# Patient Record
Sex: Female | Born: 1955 | Race: White | Hispanic: No | Marital: Married | Smoking: Former smoker
Health system: Southern US, Community
[De-identification: ages and names within clinical notes are randomized; demographics above are authoritative.]

## PROBLEM LIST (undated history)

## (undated) ENCOUNTER — Emergency Department (HOSPITAL_COMMUNITY): Payer: BLUE CROSS/BLUE SHIELD | Source: Home / Self Care

## (undated) DIAGNOSIS — I10 Essential (primary) hypertension: Secondary | ICD-10-CM

## (undated) DIAGNOSIS — M5030 Other cervical disc degeneration, unspecified cervical region: Secondary | ICD-10-CM

## (undated) DIAGNOSIS — G5601 Carpal tunnel syndrome, right upper limb: Secondary | ICD-10-CM

## (undated) DIAGNOSIS — N390 Urinary tract infection, site not specified: Secondary | ICD-10-CM

## (undated) DIAGNOSIS — I639 Cerebral infarction, unspecified: Secondary | ICD-10-CM

## (undated) DIAGNOSIS — R7989 Other specified abnormal findings of blood chemistry: Secondary | ICD-10-CM

## (undated) DIAGNOSIS — K922 Gastrointestinal hemorrhage, unspecified: Secondary | ICD-10-CM

## (undated) DIAGNOSIS — I2510 Atherosclerotic heart disease of native coronary artery without angina pectoris: Secondary | ICD-10-CM

## (undated) DIAGNOSIS — C50919 Malignant neoplasm of unspecified site of unspecified female breast: Secondary | ICD-10-CM

## (undated) DIAGNOSIS — M797 Fibromyalgia: Secondary | ICD-10-CM

## (undated) DIAGNOSIS — R061 Stridor: Secondary | ICD-10-CM

## (undated) DIAGNOSIS — R778 Other specified abnormalities of plasma proteins: Secondary | ICD-10-CM

## (undated) DIAGNOSIS — E785 Hyperlipidemia, unspecified: Secondary | ICD-10-CM

## (undated) DIAGNOSIS — I2699 Other pulmonary embolism without acute cor pulmonale: Secondary | ICD-10-CM

## (undated) DIAGNOSIS — M5136 Other intervertebral disc degeneration, lumbar region: Secondary | ICD-10-CM

## (undated) DIAGNOSIS — M51369 Other intervertebral disc degeneration, lumbar region without mention of lumbar back pain or lower extremity pain: Secondary | ICD-10-CM

## (undated) HISTORY — DX: Other intervertebral disc degeneration, lumbar region: M51.36

## (undated) HISTORY — DX: Malignant neoplasm of unspecified site of unspecified female breast: C50.919

## (undated) HISTORY — PX: TUBAL LIGATION: SHX77

## (undated) HISTORY — DX: Hyperlipidemia, unspecified: E78.5

## (undated) HISTORY — DX: Fibromyalgia: M79.7

## (undated) HISTORY — DX: Other cervical disc degeneration, unspecified cervical region: M50.30

## (undated) HISTORY — PX: CARDIAC SURGERY: SHX584

## (undated) HISTORY — PX: TRACHEOSTOMY: SUR1362

## (undated) HISTORY — DX: Carpal tunnel syndrome, right upper limb: G56.01

## (undated) HISTORY — PX: APPENDECTOMY: SHX54

## (undated) HISTORY — DX: Other intervertebral disc degeneration, lumbar region without mention of lumbar back pain or lower extremity pain: M51.369

## (undated) HISTORY — PX: BREAST SURGERY: SHX581

---

## 1998-04-02 ENCOUNTER — Encounter: Payer: Self-pay | Admitting: Emergency Medicine

## 1998-04-02 ENCOUNTER — Emergency Department (HOSPITAL_COMMUNITY): Admission: EM | Admit: 1998-04-02 | Discharge: 1998-04-03 | Payer: Self-pay | Admitting: Emergency Medicine

## 1998-04-03 ENCOUNTER — Encounter: Payer: Self-pay | Admitting: Emergency Medicine

## 2000-01-30 ENCOUNTER — Ambulatory Visit (HOSPITAL_BASED_OUTPATIENT_CLINIC_OR_DEPARTMENT_OTHER): Admission: RE | Admit: 2000-01-30 | Discharge: 2000-01-30 | Payer: Self-pay | Admitting: Orthopedic Surgery

## 2002-06-14 ENCOUNTER — Ambulatory Visit (HOSPITAL_COMMUNITY): Admission: RE | Admit: 2002-06-14 | Discharge: 2002-06-14 | Payer: Self-pay | Admitting: Internal Medicine

## 2002-06-14 ENCOUNTER — Encounter: Payer: Self-pay | Admitting: Internal Medicine

## 2002-06-21 ENCOUNTER — Encounter: Payer: Self-pay | Admitting: Internal Medicine

## 2002-06-21 ENCOUNTER — Ambulatory Visit (HOSPITAL_COMMUNITY): Admission: RE | Admit: 2002-06-21 | Discharge: 2002-06-21 | Payer: Self-pay | Admitting: Internal Medicine

## 2003-02-06 ENCOUNTER — Encounter: Payer: Self-pay | Admitting: Family Medicine

## 2003-02-06 ENCOUNTER — Ambulatory Visit (HOSPITAL_COMMUNITY): Admission: RE | Admit: 2003-02-06 | Discharge: 2003-02-06 | Payer: Self-pay | Admitting: Family Medicine

## 2003-03-28 ENCOUNTER — Ambulatory Visit (HOSPITAL_COMMUNITY): Admission: RE | Admit: 2003-03-28 | Discharge: 2003-03-28 | Payer: Self-pay | Admitting: Family Medicine

## 2003-03-28 ENCOUNTER — Encounter: Payer: Self-pay | Admitting: Family Medicine

## 2003-05-31 ENCOUNTER — Ambulatory Visit (HOSPITAL_COMMUNITY): Admission: RE | Admit: 2003-05-31 | Discharge: 2003-05-31 | Payer: Self-pay | Admitting: Family Medicine

## 2003-06-24 HISTORY — PX: CARDIAC CATHETERIZATION: SHX172

## 2003-06-24 HISTORY — PX: VEIN SURGERY: SHX48

## 2003-07-13 ENCOUNTER — Ambulatory Visit (HOSPITAL_COMMUNITY): Admission: RE | Admit: 2003-07-13 | Discharge: 2003-07-13 | Payer: Self-pay | Admitting: Internal Medicine

## 2003-12-17 ENCOUNTER — Observation Stay (HOSPITAL_COMMUNITY): Admission: AD | Admit: 2003-12-17 | Discharge: 2003-12-18 | Payer: Self-pay | Admitting: Cardiovascular Disease

## 2003-12-17 ENCOUNTER — Emergency Department (HOSPITAL_COMMUNITY): Admission: EM | Admit: 2003-12-17 | Discharge: 2003-12-17 | Payer: Self-pay | Admitting: Emergency Medicine

## 2004-01-09 ENCOUNTER — Encounter: Admission: RE | Admit: 2004-01-09 | Discharge: 2004-01-09 | Payer: Self-pay | Admitting: Cardiovascular Disease

## 2004-01-10 ENCOUNTER — Inpatient Hospital Stay (HOSPITAL_COMMUNITY): Admission: RE | Admit: 2004-01-10 | Discharge: 2004-01-14 | Payer: Self-pay | Admitting: Cardiovascular Disease

## 2004-01-12 ENCOUNTER — Encounter (INDEPENDENT_AMBULATORY_CARE_PROVIDER_SITE_OTHER): Payer: Self-pay | Admitting: Specialist

## 2004-11-25 ENCOUNTER — Ambulatory Visit (HOSPITAL_COMMUNITY): Admission: RE | Admit: 2004-11-25 | Discharge: 2004-11-25 | Payer: Self-pay | Admitting: Family Medicine

## 2007-03-02 ENCOUNTER — Ambulatory Visit (HOSPITAL_COMMUNITY): Admission: RE | Admit: 2007-03-02 | Discharge: 2007-03-02 | Payer: Self-pay | Admitting: Family Medicine

## 2007-03-10 ENCOUNTER — Encounter: Admission: RE | Admit: 2007-03-10 | Discharge: 2007-03-10 | Payer: Self-pay | Admitting: Family Medicine

## 2007-03-10 ENCOUNTER — Encounter (INDEPENDENT_AMBULATORY_CARE_PROVIDER_SITE_OTHER): Payer: Self-pay | Admitting: Diagnostic Radiology

## 2007-03-10 DIAGNOSIS — C50919 Malignant neoplasm of unspecified site of unspecified female breast: Secondary | ICD-10-CM

## 2007-03-10 HISTORY — DX: Malignant neoplasm of unspecified site of unspecified female breast: C50.919

## 2007-03-18 ENCOUNTER — Encounter: Admission: RE | Admit: 2007-03-18 | Discharge: 2007-03-18 | Payer: Self-pay | Admitting: Family Medicine

## 2007-03-24 HISTORY — PX: MASTECTOMY: SHX3

## 2007-04-13 ENCOUNTER — Ambulatory Visit (HOSPITAL_COMMUNITY): Admission: RE | Admit: 2007-04-13 | Discharge: 2007-04-14 | Payer: Self-pay | Admitting: General Surgery

## 2007-04-13 ENCOUNTER — Encounter (INDEPENDENT_AMBULATORY_CARE_PROVIDER_SITE_OTHER): Payer: Self-pay | Admitting: General Surgery

## 2007-04-26 ENCOUNTER — Ambulatory Visit: Payer: Self-pay | Admitting: Oncology

## 2007-06-24 DIAGNOSIS — I639 Cerebral infarction, unspecified: Secondary | ICD-10-CM

## 2007-06-24 HISTORY — DX: Cerebral infarction, unspecified: I63.9

## 2007-07-23 ENCOUNTER — Ambulatory Visit: Payer: Self-pay | Admitting: Oncology

## 2007-10-22 ENCOUNTER — Ambulatory Visit: Payer: Self-pay | Admitting: Vascular Surgery

## 2007-10-22 ENCOUNTER — Ambulatory Visit: Payer: Self-pay | Admitting: *Deleted

## 2008-02-22 ENCOUNTER — Ambulatory Visit: Payer: Self-pay | Admitting: Oncology

## 2008-04-20 ENCOUNTER — Emergency Department (HOSPITAL_COMMUNITY): Admission: EM | Admit: 2008-04-20 | Discharge: 2008-04-20 | Payer: Self-pay | Admitting: Emergency Medicine

## 2008-09-04 ENCOUNTER — Ambulatory Visit (HOSPITAL_COMMUNITY): Admission: RE | Admit: 2008-09-04 | Discharge: 2008-09-04 | Payer: Self-pay | Admitting: Family Medicine

## 2008-09-07 ENCOUNTER — Ambulatory Visit: Payer: Self-pay | Admitting: Oncology

## 2008-10-02 LAB — COMPREHENSIVE METABOLIC PANEL
ALT: 19 U/L (ref 0–35)
Albumin: 4 g/dL (ref 3.5–5.2)
CO2: 24 mEq/L (ref 19–32)
Calcium: 9.1 mg/dL (ref 8.4–10.5)
Chloride: 107 mEq/L (ref 96–112)
Glucose, Bld: 112 mg/dL — ABNORMAL HIGH (ref 70–99)
Potassium: 4 mEq/L (ref 3.5–5.3)
Sodium: 141 mEq/L (ref 135–145)
Total Protein: 6.6 g/dL (ref 6.0–8.3)

## 2008-10-02 LAB — CBC WITH DIFFERENTIAL/PLATELET
BASO%: 0.6 % (ref 0.0–2.0)
EOS%: 8.3 % — ABNORMAL HIGH (ref 0.0–7.0)
Eosinophils Absolute: 0.5 10*3/uL (ref 0.0–0.5)
HGB: 14 g/dL (ref 11.6–15.9)
MCH: 29.6 pg (ref 25.1–34.0)
MCHC: 34.3 g/dL (ref 31.5–36.0)
MCV: 86.4 fL (ref 79.5–101.0)
NEUT%: 56.5 % (ref 38.4–76.8)
Platelets: 246 10*3/uL (ref 145–400)
lymph#: 1.7 10*3/uL (ref 0.9–3.3)

## 2008-10-02 LAB — CANCER ANTIGEN 27.29: CA 27.29: 18 U/mL (ref 0–39)

## 2008-10-02 LAB — LACTATE DEHYDROGENASE: LDH: 135 U/L (ref 94–250)

## 2009-10-08 ENCOUNTER — Ambulatory Visit: Payer: Self-pay | Admitting: Vascular Surgery

## 2009-10-19 ENCOUNTER — Ambulatory Visit: Payer: Self-pay | Admitting: Oncology

## 2009-10-22 LAB — COMPREHENSIVE METABOLIC PANEL
BUN: 8 mg/dL (ref 6–23)
CO2: 24 mEq/L (ref 19–32)
Chloride: 105 mEq/L (ref 96–112)
Glucose, Bld: 148 mg/dL — ABNORMAL HIGH (ref 70–99)
Potassium: 4.1 mEq/L (ref 3.5–5.3)
Sodium: 140 mEq/L (ref 135–145)

## 2009-10-22 LAB — LACTATE DEHYDROGENASE: LDH: 154 U/L (ref 94–250)

## 2009-10-22 LAB — CBC WITH DIFFERENTIAL/PLATELET
Basophils Absolute: 0 10*3/uL (ref 0.0–0.1)
Eosinophils Absolute: 0.5 10*3/uL (ref 0.0–0.5)
HCT: 42.7 % (ref 34.8–46.6)
HGB: 14.4 g/dL (ref 11.6–15.9)
MONO#: 0.5 10*3/uL (ref 0.1–0.9)
Platelets: 250 10*3/uL (ref 145–400)
RDW: 14.4 % (ref 11.2–14.5)

## 2009-10-24 ENCOUNTER — Ambulatory Visit (HOSPITAL_COMMUNITY): Admission: RE | Admit: 2009-10-24 | Discharge: 2009-10-24 | Payer: Self-pay | Admitting: Family Medicine

## 2009-12-18 ENCOUNTER — Ambulatory Visit (HOSPITAL_COMMUNITY): Admission: RE | Admit: 2009-12-18 | Discharge: 2009-12-18 | Payer: Self-pay | Admitting: Family Medicine

## 2010-07-13 ENCOUNTER — Encounter: Payer: Self-pay | Admitting: Family Medicine

## 2010-07-14 ENCOUNTER — Encounter: Payer: Self-pay | Admitting: Family Medicine

## 2010-07-15 ENCOUNTER — Encounter: Payer: Self-pay | Admitting: Oncology

## 2010-10-03 ENCOUNTER — Other Ambulatory Visit (HOSPITAL_COMMUNITY): Payer: Self-pay | Admitting: Family Medicine

## 2010-10-03 DIAGNOSIS — Z139 Encounter for screening, unspecified: Secondary | ICD-10-CM

## 2010-10-23 ENCOUNTER — Other Ambulatory Visit: Payer: Self-pay | Admitting: Oncology

## 2010-10-23 ENCOUNTER — Ambulatory Visit (HOSPITAL_COMMUNITY)
Admission: RE | Admit: 2010-10-23 | Discharge: 2010-10-23 | Disposition: A | Discharge status: Home / Self Care | Payer: Medicaid Other | Source: Ambulatory Visit | Attending: Oncology | Admitting: Oncology

## 2010-10-23 ENCOUNTER — Encounter (HOSPITAL_BASED_OUTPATIENT_CLINIC_OR_DEPARTMENT_OTHER): Payer: Medicaid Other | Admitting: Oncology

## 2010-10-23 DIAGNOSIS — C50919 Malignant neoplasm of unspecified site of unspecified female breast: Secondary | ICD-10-CM | POA: Insufficient documentation

## 2010-10-23 DIAGNOSIS — D0590 Unspecified type of carcinoma in situ of unspecified breast: Secondary | ICD-10-CM

## 2010-10-23 DIAGNOSIS — M25476 Effusion, unspecified foot: Secondary | ICD-10-CM | POA: Insufficient documentation

## 2010-10-23 DIAGNOSIS — R52 Pain, unspecified: Secondary | ICD-10-CM

## 2010-10-23 DIAGNOSIS — W19XXXA Unspecified fall, initial encounter: Secondary | ICD-10-CM | POA: Insufficient documentation

## 2010-10-23 DIAGNOSIS — M25473 Effusion, unspecified ankle: Secondary | ICD-10-CM | POA: Insufficient documentation

## 2010-10-23 DIAGNOSIS — S8990XA Unspecified injury of unspecified lower leg, initial encounter: Secondary | ICD-10-CM | POA: Insufficient documentation

## 2010-10-23 DIAGNOSIS — I10 Essential (primary) hypertension: Secondary | ICD-10-CM

## 2010-10-23 DIAGNOSIS — F17200 Nicotine dependence, unspecified, uncomplicated: Secondary | ICD-10-CM

## 2010-10-23 DIAGNOSIS — Z170 Estrogen receptor positive status [ER+]: Secondary | ICD-10-CM

## 2010-10-23 DIAGNOSIS — M25469 Effusion, unspecified knee: Secondary | ICD-10-CM | POA: Insufficient documentation

## 2010-10-23 DIAGNOSIS — M25579 Pain in unspecified ankle and joints of unspecified foot: Secondary | ICD-10-CM | POA: Insufficient documentation

## 2010-10-23 DIAGNOSIS — S8000XA Contusion of unspecified knee, initial encounter: Secondary | ICD-10-CM | POA: Insufficient documentation

## 2010-10-23 LAB — CBC WITH DIFFERENTIAL/PLATELET
HCT: 41.7 % (ref 34.8–46.6)
HGB: 13.9 g/dL (ref 11.6–15.9)
LYMPH%: 27.2 % (ref 14.0–49.7)
MCH: 29.3 pg (ref 25.1–34.0)
MONO%: 5.7 % (ref 0.0–14.0)
Platelets: 238 10*3/uL (ref 145–400)
RBC: 4.77 10*6/uL (ref 3.70–5.45)
RDW: 14.5 % (ref 11.2–14.5)
lymph#: 2.7 10*3/uL (ref 0.9–3.3)

## 2010-10-23 LAB — COMPREHENSIVE METABOLIC PANEL
ALT: 22 U/L (ref 0–35)
Albumin: 3.6 g/dL (ref 3.5–5.2)
CO2: 23 mEq/L (ref 19–32)
Chloride: 102 mEq/L (ref 96–112)
Creatinine, Ser: 0.69 mg/dL (ref 0.40–1.20)
Glucose, Bld: 234 mg/dL — ABNORMAL HIGH (ref 70–99)
Total Protein: 7.2 g/dL (ref 6.0–8.3)

## 2010-10-23 LAB — LACTATE DEHYDROGENASE: LDH: 186 U/L (ref 94–250)

## 2010-10-24 LAB — VITAMIN D 25 HYDROXY (VIT D DEFICIENCY, FRACTURES): Vit D, 25-Hydroxy: 29 ng/mL — ABNORMAL LOW (ref 30–89)

## 2010-10-28 ENCOUNTER — Ambulatory Visit (HOSPITAL_COMMUNITY)
Admission: RE | Admit: 2010-10-28 | Discharge: 2010-10-28 | Disposition: A | Discharge status: Home / Self Care | Payer: Medicaid Other | Source: Ambulatory Visit | Attending: Family Medicine | Admitting: Family Medicine

## 2010-10-28 DIAGNOSIS — Z1231 Encounter for screening mammogram for malignant neoplasm of breast: Secondary | ICD-10-CM | POA: Insufficient documentation

## 2010-10-28 DIAGNOSIS — Z139 Encounter for screening, unspecified: Secondary | ICD-10-CM

## 2010-11-05 NOTE — Op Note (Signed)
NAME:  Kerry [name], Kerry [name]            ACCOUNT NO.:  [id number]   MEDICAL RECORD NO.:  [id number]          PATIENT TYPE:  OIB   LOCATION:  5736                         FACILITY:  [facility]   PHYSICIAN:  Ollen Gross. Vernell Morgans, M.D. DATE OF BIRTH:  May 29, 1956   DATE OF PROCEDURE:  04/13/2007  DATE OF DISCHARGE:                               OPERATIVE REPORT   PREOPERATIVE DIAGNOSIS:  Left breast ductal carcinoma in situ.   POSTOPERATIVE DIAGNOSIS:  Left breast ductal carcinoma in situ.   PROCEDURE:  Left mastectomy and sentinel lymph node biopsy with  injection of blue dye.   SURGEON:  Ollen Gross. Vernell Morgans, M.D.   ANESTHESIA:  General via LMA.   OPERATIVE PROCEDURE:  After informed consent was obtained, the patient  was brought to the operating room and placed in the supine position on  the operating table.  After adequate induction of general anesthesia,  the patient's left chest, breast, and axilla were prepped with Betadine  and draped in the usual sterile manner.  Earlier in the day, the patient  had undergone injection of 1 mCi of technetium sulfur colloid in the  subareolar region.   At this point, 2 mL of methylene blue and 3 mL of injectable saline were  also injected in the subareolar region of the left breast. The NeoProbe  was used to identify a hot spot in the left axilla and this was marked  with blue dye on the skin.  Next, an elliptical incision was mapped out  around the nipple areolar complex from the medial portion of the breast  to the lateral portion of the breast such as to not leave a lot of  excess skin for the closure. This incision was then made with a 10 blade  knife.  The incision was carried down through the skin into the  subcutaneous tissue sharply with electrocautery.  Skin hooks were used  to elevate the skin flaps anteriorly towards the ceiling.  Thin  subcutaneous flaps were then created circumferentially around the  incision.  These skin flaps were taken all  the way down to the chest  wall. Once this was all accomplished, the dissection was also carried  into the left axillary region to the edge of the breast tissue along the  chest wall. After the boundaries of this dissection were created, the  NeoProbe was used to examine the left axillary tissue. A single hot spot  was identified and this was dissected free using the electrocautery.  This was sent as sentinel node number 1 and had ex vivo counts around  200.  Another small lymph node that was blue but not hot was also  identified and it was also excised sharply with electrocautery and we  sent this to pathology as sentinel node number 2. No other hot spots or  palpable adenopathy were appreciable.   The breast, at this point, was then removed from the chest wall with the  pectoralis fascia.  This was done sharply with electrocautery from  superior to inferior and medial to lateral.  Once this was completely  accomplished, the breast was  separated from the chest wall and removed.  It was marked with a stitch on the lateral aspect and sent to pathology  for further evaluation. The wound was irrigated with copious amounts of  saline.  Again, hemostasis was achieved using Bovie electrocautery. One  small vessel in the pectoral axillary area was controlled with a couple  of vascular clips. Once this was accomplished, two small stab incisions  were made inferior to the wound.  A tonsil clamp was placed through each  of these stab wounds and used to bring a drain into the wound, 19 [place]  round Blake drains were used. The drains were placed along the chest  wall.  The drains were anchored to the skin with 3-0 nylon stitch. The  subcutaneous tissue of the incision was then reapproximated with  interrupted 3-0 Vicryl stitches and the skin was then closed with a  running 4-0 Monocryl subcuticular stitch.  A Dermabond dressing was  applied, the drains were placed to suction.  At this point, the  incision  looked good and the skin appeared to be healthy. Sterile  dressings were applied.  The patient tolerated the procedure well.  At  the end of the case, all needle, sponge, and instrument counts were  correct.  The patient was then awakened and taken to the recovery room  in stable condition.  Touch preps on the sentinel nodes were negative.      Ollen Gross. Vernell Morgans, M.D.  Electronically Signed     PST/MEDQ  D:  04/13/2007  T:  04/13/2007  Job:  [id number]

## 2010-11-08 NOTE — Cardiovascular Report (Signed)
NAME:  Kerry [name], Kerry [name]                      ACCOUNT NO.:  [id number]   MEDICAL RECORD NO.:  [id number]                   PATIENT TYPE:  OIB   LOCATION:  2928                                 FACILITY:  [facility]   PHYSICIAN:  Nanetta Batty, M.D.                DATE OF BIRTH:  1955/09/16   DATE OF PROCEDURE:  01/11/2004  DATE OF DISCHARGE:                              CARDIAC CATHETERIZATION   INDICATIONS:  [name] is a 55 year old female who underwent diagnostic  coronary arteriography approximately three weeks ago.  She had development  of right lower extremity claudication approximately two weeks post procedure  and Doppler suggested high grade stenosis versus occlusion of the distal  right external iliac artery/common femoral artery with an ABI of 0.6.  She  was angiogrammed yesterday which confirmed this and PTA was performed with  an undersized balloon and then overnight thrombolysis was performed with  Retavase.  She presents now for relook angiography.   PROCEDURE DESCRIPTION:  The patient was brought to the sixth floor [facility] in postabsorptive state.  She  was not premedicated.  Her left groin was prepped and shaved in the usual  sterile fashion.  1% Xylocaine was used for local anesthesia.  The infusion  wire was removed and the existing 6 [place] long Terumo sheath was then  exchanged over a Wholey wire for a new sterile sheath using sterile double-  glove technique.  Angiography was then performed revealing an occluded right  common femoral artery with reconstitution at the profunda SFA bifurcation  and three-vessel runoff.  Visipaque dye was used for the entirety of the  case.  Retrograde aortic pressure was monitored during the case.  A short 6  French sheath was then replaced at the end of the case in the left femoral  artery over the 3.5 Wholey wire.  The patient received an additional gram of  Rocephin for antibiotic  prophylaxis.  An ACT was measured at approximately  60.  The sheaths were sewn securely in place.   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  The patient  failed overnight thrombolysis.  She will need surgical revascularization.  She left the lab in stable condition.                                               Nanetta Batty, M.D.    [name]  D:  01/11/2004  T:  01/11/2004  Job:  [id number]   cc:   [facility]  [address]  [place], [place] [address]   Dani Gobble, MD  Fax: [phone]   Madelin Rear. Sherwood Gambler, M.D.  [address]  [place]  [place] [address]  Fax: [phone]

## 2010-11-08 NOTE — Cardiovascular Report (Signed)
NAME:  NUR, RABOLD                      ACCOUNT NO.:  [id number]   MEDICAL RECORD NO.:  [id number]                   PATIENT TYPE:  INP   LOCATION:  3712                                 FACILITY:  [facility]   PHYSICIAN:  Cristy Hilts. Jacinto Halim, M.D.                  DATE OF BIRTH:  12-Dec-1955   DATE OF PROCEDURE:  12/18/2003  DATE OF DISCHARGE:  12/18/2003                              CARDIAC CATHETERIZATION   REFERRING PHYSICIAN:  Madelin Rear. Sherwood Gambler, M.D. and Kem Boroughs, M.D.   PROCEDURES PERFORMED:  1. Left ventriculography.  2. Selective right and left coronary arteriography.  3. Ascending aortogram.  4. Abdominal aortogram.  5. Right femoral angiography.  6. Closure of the right femoral artery access with Angio-Seal.   CARDIOLOGIST:  Cristy Hilts. Jacinto Halim, M.D.   INDICATIONS:  Ms. Stallman is a 55 year old Caucasian female without and  significant cardiac risk factors; however, has had a diagnosis of  fibromyalgia.  She previously has had negative Cardiolite and 2-D  echocardiogram; and, now presents with recurrent chest pain.  She was  brought to the cardiac catheterization lab for a definitive diagnosis of  coronary disease.   HEMODYNAMIC DATA:  The left ventricular pressures were 148/20 with an end-  diastolic pressure of 18 mmHg.  Aortic pressure 129/72 with a mean of 96  mmHg.  There is a pressure gradient of 12 mmHg across the aortic valve.   ANGIOGRAPHIC DATA:  Left Ventricle:  The left ventricular systolic function  was normal.  The ejection fraction was estimated at 55% to 60%.   Right Coronary Artery:  The right coronary artery is a large caliber vessel  and a dominant vessel.  It is normal.   Left Main Coronary Artery:  The left main coronary artery is a large caliber  vessel.  It appears normal.   Left Circumflex:  The left circumflex is a large caliber vessel and gives  origin to two small obtuse marginals and continues as a large obtuse  marginal-3.  It is  normal.   Left Anterior Descending Artery:  The left anterior descending artery is  large caliber vessel.  It is normal.   AORTOGRAPHIC DATA:  Ascending Aortogram:  Ascending aortogram reveals the  presence of three aortic valve cusps.  There was no evidence of aortic  regurgitation or aortic dissection.   Abdominal Aortogram:  Abdominal aortogram revealed the presence of two renal  arteries; one on either side that are widely patent.  There is no evidence  of abdominal aortic aneurysm.  The aortoiliac bifurcation was widely patent.   IMPRESSION:  1. Normal coronary arteries with normal left ventricular systolic function,     ejection fraction 55-60%.  2. Normal coronary arteries, right dominant circulation.  3. A pressure gradient across the aortic valve of 12 mmHg.  The aortic     valves appear to be normal on ascending aortogram.  No aortic  regurgitation and no aortic dissection.   RECOMMENDATIONS:  1. Evaluation for noncardiac cause of chest pain is indicated.  2. Echocardiogram to rule out bicuspid aortic valve; it previously was not     done - needs to be done.   TECHNIQUE OF THE PROCEDURE:  Under the usual sterile precautions using a 6  French right femoral artery access a 6 [place] multipurpose B-2 catheter was  advanced into the ascending aorta with a 0.035 inch J-wire.  The catheter  easily advanced in the left ventricle.  Left ventricular pressures were  monitored.  Hand contrast injection in the left ventricle was performed both  in the LAO and RAO projections.  The catheter was flushed with saline and  pulled back to the ascending aorta, and pressure gradient across the aortic  valve was monitored.  The right coronary artery was selectively engaged and  angiography was performed.  In a similar fashion the left main coronary  artery was selectively engaged and angiography was performed.  Then the  catheter was pulled into the root of the aorta and ascending  aortogram was  performed in the LAO projection.  Then the catheter was pulled back into the  abdominal aorta and abdominal aortogram was performed.  Then the catheter  was pulled out of the body in the usual fashion.  Right femoral angiography  was performed through the arterial access sheath and the access was closed  with Angio-Seal with excellent hemostasis obtained.   The patient was transferred to the recovery room in a stable condition.  The  patient tolerated the procedure well.                                               Cristy Hilts. Jacinto Halim, M.D.    Pilar Plate  D:  12/18/2003  T:  12/18/2003  Job:  [id number]   cc:   Madelin Rear. Sherwood Gambler, M.D.  [address]  [place]  [place] [address]  Fax: [phone]   Dani Gobble, MD  Fax: [phone]

## 2010-11-08 NOTE — Discharge Summary (Signed)
NAME:  Kerry [name], Kerry [name]                      ACCOUNT NO.:  [id number]   MEDICAL RECORD NO.:  [id number]                   PATIENT TYPE:  INP   LOCATION:  2004                                 FACILITY:  [facility]   PHYSICIAN:  Nanetta Batty, M.D.                DATE OF BIRTH:  [birth year]   DATE OF ADMISSION:  01/09/2004  DATE OF DISCHARGE:  01/14/2004                                 DISCHARGE SUMMARY   ADMISSION DIAGNOSES:  1. Progressive claudication, right lower extremity.  2. Abnormal Dopplers performed January 09, 2004, suggesting high-frequency     signal in right external iliac artery.  3. History of cardiac catheterization December 18, 2003, with normal coronary     arteries, normal left ventricular function.  4. Fibromyalgia.  5. Urinary frequency.   DISCHARGE DIAGNOSES:  1. Progressive claudication right lower extremity.  2. Abnormal Dopplers performed January 09, 2004, suggesting high-frequency     signal in right external iliac artery.  3. History of cardiac catheterization December 18, 2003, with normal coronary     arteries, normal left ventricular function.  4. Fibromyalgia.  5. Urinary frequency.  6. Status post peripheral vascular angiogram on January 10, 2004.  See the     dictated report for details.  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  At that time, she was planned for infusion of reteplase overnight with  heparin.  1. Status post re-look angiogram on January 11, 2004, by Dr. Nanetta Batty.     Right CFA remains occluded.  2. Status post surgical consultation on January 12, 2004, with subsequent right     CVA thromboembolectomy by Dr. Madilyn Fireman.   HISTORY OF PRESENT ILLNESS:  Ms. Lawsyn Heiler is a 55 year old  moderately overweight white female with a history of chest discomfort,  fibromyalgia and family history of heart disease.  She underwent diagnostic  cardiac catheterization by Dr. Jacinto Halim on December 18, 2003, revealing normal  coronary  arteries and normal LV function.  She did have a Cardiolite prior  to that which suggested anterior ischemia versus breast attenuation.  Two  weeks post catheterization, she developed progressive claudication in her  right lower extremity, and Dopplers were performed in our office on January 08, 2004, which revealed a high frequency signal in the right external iliac  artery.  She then saw Dr. Allyson Sabal on January 09, 2004.  He felt that given her  Doppler findings and her symptoms, that we needed to proceed with diagnostic  PV angiogram.  Risks and benefits were discussed with the patient, and she  was agreeable to proceed.   On January 10, 2004, she underwent PV angiogram by Dr. Allyson Sabal.  She was found to  have a totally occluded right CFA.  At that time, he planned PTA with a  pulse spray catheter placed.  This was discussed with Dr. Irish Lack.  They planned to infuse  reteplase and heparin.  This would be infused  overnight.  We will plan for re-look angiogram in the morning.  Please see  Dr. Hazle Coca dictated note for other details.   On January 11, 2004, she underwent re-look PV angiogram.  The right CFA remains  occluded with three vessel runoff despite overnight lysis.  He planned to  obtain surgical evaluation.   On January 11, 2004, she was evaluated by Dr. Madilyn Fireman.  He planned for surgical  exploration with repair.   On January 12, 2004, she underwent right CFA thromboembolectomy with DPA/repair  of the dissection.  This was performed by Dr. Balinda Quails.  She had no  complications, and left the OR in stable condition to the PACU.   On the morning of January 13, 2004, she was sore but otherwise no other  complaints.  At this point, she remained stable.  She was seen by CVTS, and  she was felt to be stable.  They planned to ambulate and discontinue Foley.   On January 14, 2004, she remained stable, and continued to feel well.  She was  afebrile and hemodynamically stable with pulse 73, blood pressure  106/69.  Lungs were clear.  Heart is in a regular rhythm.  There was no lower-  extremity edema.  She has 2+ peripheral pulse.  At this point, she has been  seen and evaluated by Dr. Darrick Penna and also by Dr. Domingo Sep, who has deemed  her stable for discharge home.   HOSPITAL CONSULTATIONS:  CTS consultation with Dr. Liliane Bade on January 11, 2004.  This was for evaluation of right CFA occlusion.  At that point, he  planned for surgical exploration and repair.   HOSPITAL PROCEDURES:  1. She had diagnostic peripheral vascular angiography on January 10, 2004, by     Dr. Nanetta Batty.  She was found to have a totally-occluded right CFA.     She then had overnight lysis.  2. She had repeat angiogram by Dr. Allyson Sabal on January 11, 2004.  At that time, it     was found that the right CFA remained occluded.  3. Right CFA thromboembolectomy with DPA/repair of dissection on January 12, 2004, by Dr. Liliane Bade.   Telemetry showed sinus rhythm, sinus bradycardia, no other arrhythmia.   LABORATORY DATA:  I can not find in the chart where they have all labs  printed.  Obtainable labs include January 11, 2004, white count 10.1,  hemoglobin 12.8, hematocrit 38.1, platelets 255,000.  Sodium 134, potassium  3.7, BUN 5, creatinine 0.7.  Glucose 148.   Discharged July 24, sodium 129, potassium 4.0, BUN 2, creatinine 0.7,  glucose 112.  White count 7.3.  Hematocrit 32.8, platelets 191,000.   DISCHARGE MEDICATIONS:  Resume home medications.   DISCHARGE INSTRUCTIONS:  1. No lifting over 10 pounds.  2. No driving for one week.  3. Keep the right groin dry with gauze.  4. May shower and pat the incision dry.  No ointment or creams needed.   FOLLOWUP:  1. In one week, the office was to call him with an appointment to see Dr.     Domingo Sep.  2. He was to call to make an appointment to see Dr. Madilyn Fireman in the next two to     three weeks.      Mary B. Remer Macho, P.A.-C.                   Christiane Ha  Allyson Sabal, M.D.     MBE/MEDQ  D:  02/02/2004  T:  02/03/2004  Job:  [id number]   cc:   Nanetta Batty, M.D.  Fax: [phone]   Dani Gobble, MD  Fax: [phone]   Madelin Rear. Sherwood Gambler, M.D.  [address]  [place]  [place] [address]  Fax: [phone]   P. Liliane Bade, M.D.  [address]  [place]  [place] [address]

## 2010-11-08 NOTE — Op Note (Signed)
NAME:  Kerry [name], Kerry [name]                      ACCOUNT NO.:  [id number]   MEDICAL RECORD NO.:  [id number]                   PATIENT TYPE:  OIB   LOCATION:  2004                                 FACILITY:  [facility]   PHYSICIAN:  Balinda Quails, M.D.                 DATE OF BIRTH:  Feb 01, 1956   DATE OF PROCEDURE:  01/12/2004  DATE OF DISCHARGE:                                 OPERATIVE REPORT   SURGEON:  Balinda Quails, M.D.   ASSISTANT:  Rowe Clack, P.A.-C.   ANESTHETIC:  General with LMA.   ANESTHESIOLOGIST:  Bedelia Person, M.D.   PREOPERATIVE DIAGNOSIS:  Right common femoral artery occlusion.   POSTOPERATIVE DIAGNOSIS:  Right common femoral artery occlusion.   PROCEDURE:  Right common femoral artery repair with Dacron patch  angioplasty.   CLINICAL NOTE:  [name] is a 55 year old female who recently  underwent elective cardiac catheterization approximately four weeks ago.  She developed right lower extremity claudication following this.  Workup for  this revealed evidence of right common femoral artery occlusion.  An attempt  was made at endovascular recannulization unsuccessfully with lysis.  She is  brought to the operating room at this time for elective repair of the right  common femoral artery injury.   PROCEDURE NOTE:  The patient was brought to the operating room in stable  condition.  She was placed in the supine position, general anesthesia  induced with LMA.  Right leg prepped in sterile fashion.  Longitudinal skin  incision was made through the right groin.  Dissection was carried down  through the subcutaneous tissue with electrocautery.  Lymphatics divided.  Deep dissection was carried down to expose the inguinal ligament.  The  inguinal ligament mobilized with retractors superiorly.  Common femoral  artery was freed and dissected proximally up to the external iliac artery.  The common femoral artery revealed no pulse.  The high external iliac artery  did  reveal palpable pulse.  The proximal external iliac artery encircled  with a vessel loop.  Distal dissection carried down to expose the profunda  superficial femoral arteries which were encircled with vessel loops.  The  patient administered 5000 units of heparin intravenously.  The femoral  vessel was controlled with clamps.  A longitudinal arteriotomy made in the  common femoral artery.  This revealed organized thrombus.  The arteriotomy  extended proximally and distally.  There was evidence of a dissection with a  flap in the common femoral artery.  The true lumen was re-entered beyond the  injury site.  The dissected flap was resected and the intima was tacked down  distally with interrupted 6-0 Prolene suture.  A 4 Fogarty was then passed  proximally up the external iliac artery, thrombus removed and excellent  inflow obtained.   The common femoral artery was then repaired with a Dacron patch using  running 6-0 Prolene suture.  At completion of  the patch angioplasty, vessels  were flushed.  Clamps were removed, directing initial antegrade flow down  the profunda, the superficial femoral artery then released.  The right foot  was pink with a palpable dorsalis pedis pulse.   The wound irrigated with antibiotic solution.  Subcutaneous tissue closed  with interrupted 2-0 Vicryl suture in a deep layer, running 2-0 Vicryl and 3-  0 Vicryl suture in the intermediate and superficial layers.  Skin closed  with staples.  Sterile dressings were applied.   The patient tolerated the procedure well, transferred to the recovery room  in stable condition.  No apparent complications.                                               Balinda Quails, M.D.    PGH/MEDQ  D:  01/12/2004  T:  01/13/2004  Job:  [id number]   cc:   Nanetta Batty, M.D.  Fax: [phone]   [facility]

## 2010-11-08 NOTE — H&P (Signed)
NAME:  Kerry [name], Kerry [name]                      ACCOUNT NO.:  [id number]   MEDICAL RECORD NO.:  [id number]                   PATIENT TYPE:  INP   LOCATION:  3712                                 FACILITY:  [facility]   PHYSICIAN:  Nanetta Batty, M.D.                DATE OF BIRTH:  January 08, 1956   DATE OF ADMISSION:  12/17/2003  DATE OF DISCHARGE:                                HISTORY & PHYSICAL   Kerry [name] is 55 year old female, followed by Dr. Sherwood Gambler and seen in the  past by Dr. Domingo Sep for chest pain.  She reportedly had a negative  Cardiolite study and an echocardiogram in the past.  She has a history of  fibromyalgia.  She presented to [facility] ER today with complaints of chest  pain.  She says this morning she had the onset of left chest and left arm  pain while at rest.  She became nauseated and diaphoretic.  She denies any  shortness of breath.  Symptoms waxed and waned till she came to the  emergency room at [facility], where she was given aspirin, heparin,  oxygen, nitroglycerin, and morphine.  Eventually her symptoms subsided.  Enzymes and EKG up there were unremarkable.  She is transferred now for  further evaluation.  She denies any recent exertional chest pain or unusual  dyspnea.   PAST MEDICAL HISTORY:  1. Fibromyalgia.  2. She has had a remote appendectomy.  3. Right oophorectomy.  4. She had a carpal tunnel release in 2001.  She denies any history of diabetes, hypertension, or known hyperlipidemia.   CURRENT MEDICATIONS:  None.  She has been prescribed Voltaren, Dextral, and  Xanax but does not take these.   She has no known drug allergies but is intolerant to:  1. ASPIRIN.  2. CODEINE which cause nausea.   SOCIAL HISTORY:  She is married.  She has two children and no grandchildren.  She has never smoked.  She has alcohol occasionally.   FAMILY HISTORY:  Unremarkable for coronary disease.  Her mother is alive and  has mitral valve prolapse.  Her  father is alive and has a history of chronic  kidney disease.   REVIEW OF SYSTEMS:  Essentially unremarkable, except for noted above.  She  denies any GI bleeding or peptic ulcer disease.  She has not had thyroid  problems.  She denies any recent fever or chills.   PHYSICAL EXAMINATION:  VITAL SIGNS:  Blood pressure 133/70, pulse 78, temp  97.6, respirations 18.  GENERAL:  She is a well developed overweight female in no acute distress.  HEENT:  Normocephalic.  Extraocular movements are intact.  Sclerae is  nonicteric.  Lids and conjunctivae are within normal limits.  NECK:  Without JVD and without bruit.  CHEST:  Clear to auscultation percussion.  CARDIAC:  Reveals a regular rate and rhythm without murmur, rub or gallop.  Normal S1 S2.  ABDOMEN:  Obese, nontender.  No hepatosplenomegaly.  EXTREMITIES:  Without edema.  Distal pulses are 2+/4 bilaterally with no  femoral artery bruits.  NEURO:  Grossly intact.  She is awake, alert, and oriented, cooperative.  Moves all extremities without obvious deficit.   LABS:  White count 7.7, hemoglobin 12.9, hematocrit 37.5, platelets 277.  Sodium 136, potassium 3.7, BUN 10, creatinine 0.6, INR is 0.9.  CK-MB and  troponin are negative x 2.   Chest x-ray shows no active disease.   EKG shows normal sinus rhythm without acute changes.   IMPRESSION:  1. Chest pain consistent with unstable angina, rule out coronary disease.  2. History of fibromyalgia.  3. Obesity.   PLAN:  The patient is admitted to telemetry.  We will continue with IV  heparin.  We will also add nitrate, and low-dose beta-blocker, and Protonix.  She will be setup for diagnostic catheterization tomorrow.      Abelino Derrick, P.A.                      Nanetta Batty, M.D.    Lenard Lance  D:  12/17/2003  T:  12/18/2003  Job:  [id number]   cc:   Dani Gobble, MD  Fax: [phone]

## 2010-11-08 NOTE — Cardiovascular Report (Signed)
NAME:  Kerry [name], Kerry [name]                      ACCOUNT NO.:  [id number]   MEDICAL RECORD NO.:  [id number]                   PATIENT TYPE:  OIB   LOCATION:  2899                                 FACILITY:  [facility]   PHYSICIAN:  Nanetta Batty, M.D.                DATE OF BIRTH:  1955/07/21   DATE OF PROCEDURE:  01/10/2004  DATE OF DISCHARGE:                              CARDIAC CATHETERIZATION   INDICATIONS:  Kerry [name] is a 55 year old moderately overweight white  female who underwent diagnostic coronary arteriography by Dr. Jeanella Cara December 18, 2003 revealing normal coronary arteries and LV function.  Her groin was  sealed with Angio-Seal.  Two weeks after that she developed right lower  extremity claudication.  Dopplers revealed an ABI of 0.6 on the right with a  high frequency signal.  She presents now for angiography and potential  intervention.   PROCEDURE DESCRIPTION:  The patient was brought to the sixth floor [facility]. She was premedicated with p.o.  Valium.  Her left groin was prepped and shaved in the usual sterile fashion.  1% Xylocaine was used for local anesthesia.  A 5 French sheath was inserted  into the right femoral artery using standard Seldinger technique. A 5 French  tennis racquet catheter was used for midstream and distal abdominal  aortography as well as bifemoral runoff.  Isovue dye was used for the  entirety of the case.  Retrograde aortic pressure was monitored during the  case.   ANGIOGRAPHIC RESULTS:  1. Abdominal aorta:     A. Renal arteries- normal.     B. Infrarenal abdominal aorta- normal.  2. Left lower extremity normal with three-vessel runoff.  3. Right lower extremity:     A. Tapered stenosis beginning in the right external iliac artery        culminating a short segment occlusion of the right common femoral        artery with reconstitution by collaterals at the SFA profunda        bifurcation.  There  is three-vessel runoff.   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  She is significantly  symptomatic.  We will proceed with attempted percutaneous intervention.   PROCEDURE DESCRIPTION:  The patient received 3000 units of heparin  intravenously.  Contralateral access was obtained with a short IMA catheter  and 0.035 Wholey wire.  A 6 French long Terumo sheath was then advanced over  the iliac bifurcation and a 5 French end-hole catheter was used for backup.  The [facility] wire easily crossed the common femoral occlusion and was placed  in the SFA.  Percutaneous transluminal coronary angioplasty was performed  with 4 x 4 Powerflex at nominal pressures doing overlapping inflations.  This did not provide an excellent angiographic result, but suggested the  presence of organizing thrombus.  Because of this, a  20 cm long pulsespray  catheter was then advanced over the iliac bifurcation and placed across the  common femoral head straddling the common femoral and SFA artery on the  right.  Plans will be to infuse reteplase with 10 unit bolus followed by 4.4  units per hour overnight with heparin infusion via the intravenous line and  side arm sheath in split dose for a total of 600 units.  It should be noted  the patient has started her menses, however, I do not think that she will  become systemically lytic and therefore do not think this will exacerbate  her menses.  However, if she has high volume vaginal bleeding we will have  to discontinue the local thrombolysis.  The sheath and pulsespray catheter  were sewn securely in place.  I am going to give the patient a gram of Ancef  as well as Solu-Medrol and Benadryl as well.                                               Nanetta Batty, M.D.    [name]  D:  01/10/2004  T:  01/10/2004  Job:  [id number]   cc:   [facility]  [address]  [place], [place] [address]   Dani Gobble, MD  Fax: [phone]   Cristy Hilts. Jacinto Halim, M.D.  [address]  [place]  [place] [address]  Fax: [phone]   Madelin Rear. Sherwood Gambler, M.D.  [address]  [place]  [place] [address]  Fax: [phone]

## 2010-11-08 NOTE — Op Note (Signed)
[facility]  Patient:    Kerry [name], Kerry [name]                     MRN: [id number] Proc. Date: 01/30/00 Attending:  Nicki Reaper, M.D.                           Operative Report  PREOPERATIVE DIAGNOSIS:  Carpal tunnel syndrome, right hand.  POSTOPERATIVE DIAGNOSIS:  Carpal tunnel syndrome, right hand.  OPERATION:  Decompression right median nerve.  SURGEON:  Nicki Reaper, M.D.  ASSISTANT:  Joaquin Courts, R.N.  ANESTHESIA:  Forearm-based IV regional.  ANESTHESIOLOGIST:  Janetta Hora. Gelene Mink, M.D.  INDICATIONS:  The patient is a 55 year old female with a history of carpal tunnel syndrome.  EMG nerve conduction positive, which has not responded to conservative treatment.  DESCRIPTION OF PROCEDURE:  The patient was brought to the operating room where a forearm-based IV regional anesthetic was carried out without difficulty. She was prepared and draped using Betadine scrub and solution with the right arm free.  A longitudinal incision was made in the palm and carried down through the subcutaneous tissue.  Bleeders were electrocauterized.  The palmar fascia was split and superficial palmar arch identified.  The flexor tendon to the ring little finger identified to the ulnar side of the median nerve.  The carpal retinaculum was incised with sharp dissection.  A right-angle and Sewall retractor were placed between the skin and forearm fascia.  The fascia was released for approximately 3 cm proximal to the wrist crease.  Complete, however, was not completed.  It was decided to proceed with an incision down the distal forearm, and a small incision was made and carried down through the subcutaneous tissue.  The remainder of the palmar fascia and distal forearm fascia was released under direct vision.  The wounds were irrigated and the skin was closed with interrupted 5-0 nylon suture.  A sterile compressive dressing and splint was applied.  The patient  tolerated the procedure well and was taken to the recovery room for observation in satisfactory condition.  She is discharged home to return to the [facility] in one week on [place], Xanax and Keflex. DD:  01/30/00 TD:  01/30/00 Job: [id number]

## 2011-04-02 LAB — COMPREHENSIVE METABOLIC PANEL
BUN: 9
Calcium: 9.3
Creatinine, Ser: 0.83
GFR calc non Af Amer: >60
Glucose, Bld: 100 — ABNORMAL HIGH
Sodium: 139
Total Protein: 7.4

## 2011-04-02 LAB — CBC
HCT: 43.2
Hemoglobin: 14.6
MCHC: 33.7
MCV: 87.8
RDW: 13.8

## 2011-04-02 LAB — HCG, SERUM, QUALITATIVE: Preg, Serum: NEGATIVE

## 2011-04-02 LAB — DIFFERENTIAL
Lymphocytes Relative: 26
Lymphs Abs: 2.1
Monocytes Relative: 6
Neutro Abs: 5.3
Neutrophils Relative %: 63

## 2011-04-10 ENCOUNTER — Other Ambulatory Visit (HOSPITAL_COMMUNITY): Payer: Self-pay | Admitting: Family Medicine

## 2011-04-10 ENCOUNTER — Ambulatory Visit (HOSPITAL_COMMUNITY)
Admission: RE | Admit: 2011-04-10 | Discharge: 2011-04-10 | Disposition: A | Discharge status: Home / Self Care | Payer: Medicaid Other | Source: Ambulatory Visit | Attending: Family Medicine | Admitting: Family Medicine

## 2011-04-10 DIAGNOSIS — M7660 Achilles tendinitis, unspecified leg: Secondary | ICD-10-CM

## 2011-04-10 DIAGNOSIS — M7730 Calcaneal spur, unspecified foot: Secondary | ICD-10-CM | POA: Insufficient documentation

## 2011-06-02 ENCOUNTER — Telehealth: Payer: Self-pay | Admitting: *Deleted

## 2011-06-02 NOTE — Telephone Encounter (Signed)
none of these patient's phone numbers work mailed out calendar and letter to inform the patient of the new date and time

## 2011-09-12 DIAGNOSIS — I2510 Atherosclerotic heart disease of native coronary artery without angina pectoris: Secondary | ICD-10-CM | POA: Insufficient documentation

## 2011-09-12 DIAGNOSIS — I10 Essential (primary) hypertension: Secondary | ICD-10-CM | POA: Insufficient documentation

## 2011-09-12 DIAGNOSIS — F17200 Nicotine dependence, unspecified, uncomplicated: Secondary | ICD-10-CM | POA: Insufficient documentation

## 2011-09-12 DIAGNOSIS — E119 Type 2 diabetes mellitus without complications: Secondary | ICD-10-CM | POA: Insufficient documentation

## 2011-09-12 DIAGNOSIS — R0789 Other chest pain: Secondary | ICD-10-CM | POA: Insufficient documentation

## 2011-09-13 ENCOUNTER — Other Ambulatory Visit: Payer: Self-pay

## 2011-09-13 ENCOUNTER — Encounter (HOSPITAL_COMMUNITY): Payer: Self-pay | Admitting: *Deleted

## 2011-09-13 ENCOUNTER — Emergency Department (HOSPITAL_COMMUNITY)
Admission: EM | Admit: 2011-09-13 | Discharge: 2011-09-13 | Disposition: A | Discharge status: Home / Self Care | Payer: Medicaid Other | Attending: Emergency Medicine | Admitting: Emergency Medicine

## 2011-09-13 ENCOUNTER — Emergency Department (HOSPITAL_COMMUNITY): Payer: Medicaid Other

## 2011-09-13 DIAGNOSIS — R0789 Other chest pain: Secondary | ICD-10-CM

## 2011-09-13 HISTORY — DX: Atherosclerotic heart disease of native coronary artery without angina pectoris: I25.10

## 2011-09-13 HISTORY — DX: Essential (primary) hypertension: I10

## 2011-09-13 LAB — DIFFERENTIAL
Basophils Absolute: 0 10*3/uL (ref 0.0–0.1)
Eosinophils Relative: 3 % (ref 0–5)
Lymphocytes Relative: 30 % (ref 12–46)
Lymphs Abs: 2.6 10*3/uL (ref 0.7–4.0)
Monocytes Absolute: 0.6 10*3/uL (ref 0.1–1.0)
Neutro Abs: 5.4 10*3/uL (ref 1.7–7.7)

## 2011-09-13 LAB — CBC
HCT: 42.2 % (ref 36.0–46.0)
MCV: 87.2 fL (ref 78.0–100.0)
RBC: 4.84 MIL/uL (ref 3.87–5.11)
WBC: 8.9 10*3/uL (ref 4.0–10.5)

## 2011-09-13 LAB — BASIC METABOLIC PANEL
CO2: 25 mEq/L (ref 19–32)
Calcium: 9.3 mg/dL (ref 8.4–10.5)
Chloride: 98 mEq/L (ref 96–112)
Glucose, Bld: 262 mg/dL — ABNORMAL HIGH (ref 70–99)
Sodium: 135 mEq/L (ref 135–145)

## 2011-09-13 LAB — TROPONIN I
Troponin I: <0.3 ng/mL (ref <0.30)
Troponin I: <0.3 ng/mL (ref <0.30)

## 2011-09-13 MED ORDER — ASPIRIN 81 MG PO CHEW
324.0000 mg | CHEWABLE_TABLET | Freq: Once | ORAL | Status: AC
  Administered 2011-09-13: 324 mg via ORAL
  Filled 2011-09-13: qty 4

## 2011-09-13 NOTE — ED Notes (Signed)
Pt reporting chest pain that began about 8pm.  States that she believes it is due to anxiety, but that she also has a history of HTN and wanted to be evaluated.

## 2011-09-13 NOTE — Discharge Instructions (Signed)
Cardiac Biomarkers Cardiac biomarkers are enzymes, proteins, and hormones that are associated with heart function, damage or failure. Some of the tests are specific for the heart while others are also elevated with skeletal muscle damage. Cardiac biomarkers are used for diagnostic and prognostic purposes and are frequently ordered by caregivers when someone comes into the Emergency Room complaining of symptoms, such as chest pain, pressure, nausea, and shortness of breath. These tests are ordered, along with other laboratory and non-laboratory tests, to detect heart failure (which is often a chronic, progressive condition affecting the ability of the heart to fill with blood and pump efficiently) and the acute coronary syndromes (ACS) as well as to help determine prognosis for people who have had a heart attack. ACS is a group of symptoms that reflect a sudden decrease in the amount of blood and oxygen, also termed 'ischemia,' reaching the heart. This decrease is frequently due to either a narrowing of the coronary arteries (atherosclerosis or vessel spasm) or unstable plaques, which can cause a blood clot (thrombus) and blockage of blood flow. If the oxygen supply is low, it can cause angina (pain); if blood flow is reduced, it can cause death of heart cells (called myocardial infarction or heart attack) and can lead to death of the affected heart muscle cells and to permanent damage and scarring of the heart.  The goal with cardiac biomarkers is to be able to detect the presence and severity of an acute heart condition as soon as possible so that appropriate treatment can be initiated.  There are only a few cardiac biomarkers that are being routinely used by physicians. Some have been phased out because they are not as specific as the marker of choice - troponin. Many other potential cardiac biomarkers are still being researched but their clinical utility has yet to be established.  Note: Cardiac biomarkers  are not the same tests as those that are used to screen the general healthy population for their risk of developing heart disease. Those can be found under Cardiac Risk Assessment. LABORATORY TESTS CURRENT CARDIAC BIOMARKERS   CK and CK-MB   BNP or (NT-proBNP)   Troponin   Myoglobin (not always used; sometimes ordered with troponin)  MORE GENERAL TESTS FREQUENTLY ORDERED ALONG WITH CARDIAC BIOMARKERS   Blood Gases   CMP   BMP   Electrolytes   CBC  ON THE HORIZON Ischemia modified albumin (IMA) - Test has received FDA approval for use with troponin and electrocardiogram to rule out acute coronary syndrome (ACS) in patients with chest pain. May become useful for identifying patients at higher risk of heart attack and potentially could replace myoglobin one day.  NON-LABORATORY TESTS These tests allow caregivers to look at the size, shape, and function of the heart as it is beating. They can be used to detect changes to the rhythm of the heart as well as to detect and evaluate damaged tissues and blocked arteries.   EKG (ECG, electrocardiogram)   Coronary angiography (or arteriography)   Stress testing   Nuclear scan   ECG (echocardiogram)   Chest X-ray  THE FOLLOWING SUMMARIZES CURRENTLY USED CARDIAC BIOMARKERS. Marker: CK  What: Enzyme that exists in three different isoforms   Where Found: Heart, brain, and skeletal muscle   What Indicates: Injury to muscle cells   Time to Increase: 4 to 6 hours after injury, peaks in 18 to 24 hours   Time back to Normal: Normal in 48 to 72 hours, unless due to continuing injury  When/How Used: Being phased out, may be ordered prior to CK-MB  Marker: CK-MB  What: Heart- related portion of total CK enzyme   Where Found: Heart primarily, but also in skeletal muscle   What Indicates: Injury (cell death) to heart   Time to Increase: 4 to 6 hrs after heart attack, peaks in 12 to 20 hours   Time back to Normal: Returns to normal  in 24 to 48 hours unless new/continual damage   When/How Used: Not as specific as Troponin for heart injury/attack, may be ordered when Troponin is not available, may be ordered to monitor new/continuing damage  Marker: Myoglobin  What: Small oxygen-storing protein   Where Found: Heart and other muscle cells   What Indicates: Injury to heart or other muscle cells. Also elevated with kidney problems.   Time to Increase: Starts to rise within 2 to 3 hours, peaks in 8 to 12 hours.   Time back to Normal: Falls back to normal by about one day after injury occurred   When/How Used: Ordered along with Troponin, helps diagnose heart injury/attack  Marker: Cardiac Troponin  What: Components of a Regulatory protein complex. Two cardiac specific isoforms: T and I   Where Found: Heart muscle   What Indicates: Heart injury/damage   Time to Increase: 4 to 8 hours   Time back to Normal: Remains elevated for 7 to 14 days   When/How Used: Ordered to help assess prognosis and diagnose heart attack  Marker: LDH  What: Enzyme   Where Found: Almost all body tissues   What Indicates: General marker of injury to cells   When/How Used: Phased out, not specific  Marker: AST  What: Enzyme   Where Found: Almost all body tissues   What Indicates: General marker of injury to cells   When/How Used: Phased out, not specific  Marker: Hs-CRP  What: Protein   Where Found: Associated with athero-sclerosis   What Indicates: Inflammatory process   Time back to Normal: Elevated with inflammation   When/How Used: May help determine prognosis of patients who have had heart attack  Marker: BNP  What: Hormone   Where Found: Heart's left ventricle   What Indicates: Heart failure   Time back to Normal: Elevation related to severity   When/How Used: Help diagnose and evaluate heart failure, prognosis, and to monitor therapy  Document Released: 07/02/2004 Document Revised: 05/29/2011 Document  Reviewed: 03/19/2005 [facility] Patient Information 2012 [place], [place].Chest Pain (Nonspecific) It is often hard to give a specific diagnosis for the cause of chest pain. There is always a chance that your pain could be related to something serious, such as a heart attack or a blood clot in the lungs. You need to follow up with your caregiver for further evaluation. CAUSES   Heartburn.   Pneumonia or bronchitis.   Anxiety or stress.   Inflammation around your heart (pericarditis) or lung (pleuritis or pleurisy).   A blood clot in the lung.   A collapsed lung (pneumothorax). It can develop suddenly on its own (spontaneous pneumothorax) or from injury (trauma) to the chest.   Shingles infection (herpes zoster virus).  The chest wall is composed of bones, muscles, and cartilage. Any of these can be the source of the pain.  The bones can be bruised by injury.   The muscles or cartilage can be strained by coughing or overwork.   The cartilage can be affected by inflammation and become sore (costochondritis).  DIAGNOSIS  Lab tests or other  studies, such as X-rays, electrocardiography, stress testing, or cardiac imaging, may be needed to find the cause of your pain.  TREATMENT   Treatment depends on what may be causing your chest pain. Treatment may include:   Acid blockers for heartburn.   Anti-inflammatory medicine.   Pain medicine for inflammatory conditions.   Antibiotics if an infection is present.   You may be advised to change lifestyle habits. This includes stopping smoking and avoiding alcohol, caffeine, and chocolate.   You may be advised to keep your head raised (elevated) when sleeping. This reduces the chance of acid going backward from your stomach into your esophagus.   Most of the time, nonspecific chest pain will improve within 2 to 3 days with rest and mild pain medicine.  HOME CARE INSTRUCTIONS   If antibiotics were prescribed, take your antibiotics as  directed. Finish them even if you start to feel better.   For the next few days, avoid physical activities that bring on chest pain. Continue physical activities as directed.   Do not smoke.   Avoid drinking alcohol.   Only take over-the-counter or prescription medicine for pain, discomfort, or fever as directed by your caregiver.   Follow your caregiver's suggestions for further testing if your chest pain does not go away.   Keep any follow-up appointments you made. If you do not go to an appointment, you could develop lasting (chronic) problems with pain. If there is any problem keeping an appointment, you must call to reschedule.  SEEK MEDICAL CARE IF:   You think you are having problems from the medicine you are taking. Read your medicine instructions carefully.   Your chest pain does not go away, even after treatment.   You develop a rash with blisters on your chest.  SEEK IMMEDIATE MEDICAL CARE IF:   You have increased chest pain or pain that spreads to your arm, neck, jaw, back, or abdomen.   You develop shortness of breath, an increasing cough, or you are coughing up blood.   You have severe back or abdominal pain, feel nauseous, or vomit.   You develop severe weakness, fainting, or chills.   You have a fever.  THIS IS AN EMERGENCY. Do not wait to see if the pain will go away. Get medical help at once. Call your local emergency services (911 in [place] Do not drive yourself to the hospital. MAKE SURE YOU:   Understand these instructions.   Will watch your condition.   Will get help right away if you are not doing well or get worse.  Document Released: 03/19/2005 Document Revised: 05/29/2011 Document Reviewed: 01/13/2008 [facility] Patient Information 2012 [place], [place].Electrocardiography Electrocardiograhy (EKG or ECG) is a test. It is used to check the heart. It looks at your heartbeat, heart size, and can tell if you have had a heart attack. No electricity will flow  into your body during the test. PROCEDURE  You will remove your clothes from the waist up, wear a hospital gown, and lie down on your back.   Small round pads (electrodes) will be placed on your arms, legs, and chest. The round pads are attached to wires that go to an EKG machine. The EKG machine records the electrical activity of your heart.   You will be asked to relax and lie very still for a few seconds.   The EKG takes a few minutes.  AFTER THE PROCEDURE  If the EKG was done as part of a routine exam, you can  go back to your normal activity as told by your doctor.   If the EKG was done because you were having chest pain, you may need to have more testing for your safety.   Your EKG results will be looked at by a heart doctor Higher education careers adviser).  Finding out the results of your test Ask when your test results will be ready. Make sure you get your test results. Document Released: 05/22/2008 Document Revised: 05/29/2011 Document Reviewed: 05/22/2008 [facility] Patient Information 2012 [place], [place].

## 2011-09-13 NOTE — ED Notes (Signed)
Pt reports left sided cp starting 4 hs ago

## 2011-09-13 NOTE — ED Provider Notes (Signed)
History     CSN: [id number]  Arrival date & time 09/12/11  2358   First MD Initiated Contact with Patient 09/13/11 0040      Chief Complaint  Patient presents with  . Chest Pain    (Consider location/radiation/quality/duration/timing/severity/associated sxs/prior treatment) Patient is a 56 y.o. female presenting with chest pain. The history is provided by the patient and medical records.  Chest Pain The chest pain began 3 - 5 hours ago. Duration of episode(s) is 3 minutes. Chest pain occurs intermittently. The chest pain is resolved. The pain is associated with stress (Emotional stress from an argument at home). At its most intense, the pain is at 4/10. The pain is currently at 0/10. The severity of the pain is moderate. The quality of the pain is described as sharp and dull (Sharp, dull, and "like numbness" at her left chest wall without any radiation.). The pain does not radiate. Chest pain is worsened by stress (Emotional stress). Pertinent negatives for primary symptoms include no fever, no fatigue, no syncope, no shortness of breath, no cough, no wheezing, no palpitations, no abdominal pain, no nausea, no vomiting and no dizziness.  Pertinent negatives for associated symptoms include no claudication, no diaphoresis, no lower extremity edema, no near-syncope, no numbness, no orthopnea and no weakness. She tried nothing for the symptoms. Risk factors: Diabetes, hypertension.  Pertinent negatives for past medical history include no CAD and no MI. Past medical history comments: The patient had a negative cardiac cath in 2005 showing no coronary artery disease of any significance. The patient has had a left mastectomy from breast cancer in the past.  Procedure history is positive for cardiac catheterization. Procedure history comments: Negative cardiac catheterization in 2005.     Past Medical History  Diagnosis Date  . Hypertension   . Coronary artery disease   . Diabetes mellitus      Past Surgical History  Procedure Date  . Cardiac surgery   . Appendectomy   . Breast surgery     No family history on file.  History  Substance Use Topics  . Smoking status: Current Everyday Smoker  . Smokeless tobacco: Not on file  . Alcohol Use: No    OB History    Grav Para Term Preterm Abortions TAB SAB Ect Mult Living                  Review of Systems  Constitutional: Negative for fever, chills, diaphoresis, activity change, appetite change and fatigue.  HENT: Negative for congestion, facial swelling, rhinorrhea, neck pain, neck stiffness and postnasal drip.   Eyes: Negative.   Respiratory: Negative for cough, choking, chest tightness, shortness of breath and wheezing.        Dyspnea on exertion  Cardiovascular: Positive for chest pain. Negative for palpitations, orthopnea, claudication, leg swelling, syncope and near-syncope.  Gastrointestinal: Negative for nausea, vomiting, abdominal pain and abdominal distention.  Musculoskeletal: Negative.   Skin: Negative.   Neurological: Negative for dizziness, syncope, speech difficulty, weakness, light-headedness, numbness and headaches.  Hematological: Does not bruise/bleed easily.  Psychiatric/Behavioral: Negative.     Allergies  Oxycodone  Home Medications   Current Outpatient Rx  Name Route Sig Dispense Refill  . ARTHROTEC PO Oral Take by mouth.    Marland Kitchen LISINOPRIL PO Oral Take by mouth.    . METFORMIN HCL PO Oral Take by mouth.    . BYSTOLIC PO Oral Take by mouth.      BP 162/83  Pulse 101  Temp(Src)  98.6 F (37 C) (Oral)  Resp 20  Ht 5\' 6"  (1.676 m)  Wt 244 lb (110.678 kg)  BMI 39.38 kg/m2  SpO2 97%  Physical Exam  Nursing note and vitals reviewed. Constitutional: She is oriented to person, place, and time. She appears well-nourished. No distress.  HENT:  Head: Normocephalic and atraumatic.  Mouth/Throat: Oropharynx is clear and moist.  Eyes: EOM are normal. Pupils are equal, round, and reactive  to light.  Neck: Normal range of motion. Neck supple. No JVD present. No tracheal deviation present.  Cardiovascular: Normal rate, regular rhythm, S1 normal, S2 normal, normal heart sounds and intact distal pulses.   No extrasystoles are present. PMI is not displaced.  Exam reveals no gallop and no friction rub.   No murmur heard. Pulmonary/Chest: Effort normal and breath sounds normal. No accessory muscle usage or stridor. Not tachypneic. No respiratory distress. She has no decreased breath sounds. She has no wheezes. She has no rhonchi. She has no rales. She exhibits tenderness and bony tenderness. She exhibits no crepitus and no retraction.       The patient's left chest is noteworthy for some concavity of the chest wall and absence of left breast secondary to remote history of mastectomy. The patient has even rise and fall of the left chest without any crepitus or instability, and is tender to palpation over the left costochondral joints in the left chest wall, reproducing the patient's pain.  Abdominal: Soft. Bowel sounds are normal. She exhibits no distension and no mass. There is no tenderness. There is no rebound and no guarding.  Musculoskeletal: Normal range of motion. She exhibits no edema and no tenderness.  Neurological: She is alert and oriented to person, place, and time. No cranial nerve deficit. She exhibits normal muscle tone.  Skin: Skin is warm and dry. No rash noted. She is not diaphoretic. No erythema. No pallor.  Psychiatric: She has a normal mood and affect. Her behavior is normal. Judgment and thought content normal.    ED Course  Procedures (including critical care time)  Labs Reviewed  BASIC METABOLIC PANEL - Abnormal; Notable for the following:    Glucose, Bld 262 (*)    Creatinine, Ser 0.48 (*)    All other components within normal limits  CBC  DIFFERENTIAL  TROPONIN I  TROPONIN I   Dg Chest Portable 1 View  09/13/2011  *RADIOLOGY REPORT*  Clinical Data: Chest  pain.  CHEST - 1 VIEW  Comparison:  04/20/2008  Findings: The heart size and mediastinal contours are within normal limits.  Both lungs are clear.  IMPRESSION: No active disease.  Original Report Authenticated By: Danae Orleans, M.D.     No diagnosis found.    MDM  MI, ACS, Unstable angina not thought likely as the etiology of the patient's atypical symptoms, with no history of CAD or MI, with reproducible symptoms on chest wall palpation suggestive of musculoskeletal chest wall pain, and normal ecg, normal cardiac enzymes.  I will obtain a 6 hour troponin to assure that my impression is correct, and if so, will discharge the patient home for outpatient follow up as needed.  The patient states her understanding of and agreement with the diagnosis and plan of care.       Felisa Bonier, MD 09/13/11 [phone]

## 2011-10-23 ENCOUNTER — Other Ambulatory Visit: Payer: Self-pay | Admitting: Oncology

## 2011-10-23 DIAGNOSIS — Z139 Encounter for screening, unspecified: Secondary | ICD-10-CM

## 2011-10-27 ENCOUNTER — Telehealth: Payer: Self-pay | Admitting: *Deleted

## 2011-10-27 NOTE — Telephone Encounter (Signed)
due to md being out of the office moved patient appointment to 12-17-2011 starting at 3:30pm left voice message informing the patient of the new date and time of 12-17-2011 starting at 3:30pm

## 2011-10-28 ENCOUNTER — Telehealth: Payer: Self-pay | Admitting: *Deleted

## 2011-10-28 NOTE — Telephone Encounter (Signed)
patient confirmed over the phone the new date and time of the appointment for lab and md 12-17-2011

## 2011-10-29 ENCOUNTER — Ambulatory Visit: Payer: Medicaid Other | Admitting: Oncology

## 2011-10-29 ENCOUNTER — Other Ambulatory Visit: Payer: Medicaid Other | Admitting: Lab

## 2011-10-30 ENCOUNTER — Ambulatory Visit (HOSPITAL_COMMUNITY)
Admission: RE | Admit: 2011-10-30 | Discharge: 2011-10-30 | Disposition: A | Discharge status: Home / Self Care | Payer: Medicaid Other | Source: Ambulatory Visit | Attending: Oncology | Admitting: Oncology

## 2011-10-30 ENCOUNTER — Other Ambulatory Visit: Payer: Self-pay | Admitting: Oncology

## 2011-10-30 DIAGNOSIS — Z1231 Encounter for screening mammogram for malignant neoplasm of breast: Secondary | ICD-10-CM | POA: Insufficient documentation

## 2011-10-30 DIAGNOSIS — Z139 Encounter for screening, unspecified: Secondary | ICD-10-CM

## 2011-12-17 ENCOUNTER — Other Ambulatory Visit (HOSPITAL_BASED_OUTPATIENT_CLINIC_OR_DEPARTMENT_OTHER): Payer: Medicaid Other | Admitting: Lab

## 2011-12-17 ENCOUNTER — Ambulatory Visit: Payer: Medicaid Other | Admitting: Oncology

## 2011-12-17 VITALS — BP 159/89 | HR 96 | Temp 98.5°F | Ht 66.0 in | Wt 234.1 lb

## 2011-12-17 DIAGNOSIS — E559 Vitamin D deficiency, unspecified: Secondary | ICD-10-CM

## 2011-12-17 DIAGNOSIS — C50919 Malignant neoplasm of unspecified site of unspecified female breast: Secondary | ICD-10-CM

## 2011-12-17 DIAGNOSIS — D0590 Unspecified type of carcinoma in situ of unspecified breast: Secondary | ICD-10-CM

## 2011-12-17 LAB — COMPREHENSIVE METABOLIC PANEL
AST: 12 U/L (ref 0–37)
Albumin: 3.8 g/dL (ref 3.5–5.2)
BUN: 11 mg/dL (ref 6–23)
CO2: 25 mEq/L (ref 19–32)
Calcium: 9.6 mg/dL (ref 8.4–10.5)
Chloride: 106 mEq/L (ref 96–112)
Creatinine, Ser: 0.59 mg/dL (ref 0.50–1.10)
Glucose, Bld: 151 mg/dL — ABNORMAL HIGH (ref 70–99)
Potassium: 3.7 mEq/L (ref 3.5–5.3)

## 2011-12-17 LAB — CBC WITH DIFFERENTIAL/PLATELET
Basophils Absolute: 0.1 10*3/uL (ref 0.0–0.1)
EOS%: 3.8 % (ref 0.0–7.0)
Eosinophils Absolute: 0.4 10*3/uL (ref 0.0–0.5)
HCT: 43.2 % (ref 34.8–46.6)
HGB: 14.6 g/dL (ref 11.6–15.9)
MCH: 29 pg (ref 25.1–34.0)
MONO#: 0.5 10*3/uL (ref 0.1–0.9)
NEUT#: 6.6 10*3/uL — ABNORMAL HIGH (ref 1.5–6.5)
NEUT%: 62.1 % (ref 38.4–76.8)
RDW: 13.8 % (ref 11.2–14.5)
lymph#: 3 10*3/uL (ref 0.9–3.3)

## 2011-12-17 NOTE — Progress Notes (Signed)
Hematology and Oncology Follow Up Visit  Kerry [name] [id number] [birth year] 56 y.o. 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  . Malignant neoplasm of breast (female), unspecified site Yes  . Unspecified vitamin D deficiency    History of vasculopathy  PAST THERAPY: As above   Interim History:  She has been in relatively well. There have been no intercurrent illnesses. She had a mammogram in April which was normal. Her diabetes appears to be under poor control she is now taking insulin Her hemoglobin A1c is been quite elevated.  Medications: I have reviewed the patient's current medications.  Allergies:  Allergies  Allergen Reactions  . Oxycodone     Past Medical History, Surgical history, Social history, and Family History were reviewed and updated.  Review of Systems: Constitutional:  Negative for fever, chills, night sweats, anorexia, weight loss, pain. Cardiovascular: no chest pain or dyspnea on exertion Respiratory: no cough, shortness of breath, or wheezing Neurological: negative Dermatological: negative ENT: negative Skin Gastrointestinal: negative Genito-Urinary: negative Hematological and Lymphatic: negative Breast: negative Musculoskeletal: negative Remaining ROS negative.  Physical Exam:  Blood pressure 159/89, pulse 96, temperature 98.5 F (36.9 C), height 5\' 6"  (1.676 m), weight 234 lb 1.6 oz (106.187 kg).  ECOG: 1HEENT:  Sclerae anicteric, conjunctivae pink.  Oropharynx clear.  No mucositis or candidiasis.  Nodes:  No cervical, supraclavicular, or axillary lymphadenopathy palpated.  Breast Exam:  Right breast is benign.  No masses, discharge, skin change, or nipple inversion.  Left breast is surgically absent, chest wall exam unremarkable. Both axilla are normal...  Lungs:  Clear to auscultation bilaterally.  No crackles, rhonchi, or  wheezes.  Heart:  Regular rate and rhythm.  Abdomen:  Soft, nontender.  Positive bowel sounds.  No organomegaly or masses palpated.  Musculoskeletal:  No focal spinal tenderness to palpation.  Extremities:  Benign.  No peripheral edema or cyanosis.  Skin:  She has been a multiple abrasions and lacerations on her lower extremities which he finds are secondary to her animals .Neuro:  Nonfocal.       Lab Results: Lab Results  Component Value Date   WBC 10.6* 12/17/2011   HGB 14.6 12/17/2011   HCT 43.2 12/17/2011   MCV 86.0 12/17/2011   PLT 239 12/17/2011     Chemistry      Component Value Date/Time   NA 142 12/17/2011 1528   K 3.7 12/17/2011 1528   CL 106 12/17/2011 1528   CO2 25 12/17/2011 1528   BUN 11 12/17/2011 1528   CREATININE 0.59 12/17/2011 1528      Component Value Date/Time   CALCIUM 9.6 12/17/2011 1528   ALKPHOS 77 12/17/2011 1528   AST 12 12/17/2011 1528   ALT 15 12/17/2011 1528   BILITOT 0.2* 12/17/2011 1528       Radiological Studies:  No results found.   IMPRESSIONS AND PLAN: A 56 y.o. female with history of DCIS status post mastectomy on observation. I will see her in a years time, with a annual mammogram.     Spent more than half the time coordinating care, as well as discussion of BMI and its implications.      Pierce Crane 6/26/20134:35 PM Cell [phone]

## 2011-12-18 ENCOUNTER — Telehealth: Payer: Self-pay | Admitting: *Deleted

## 2011-12-18 NOTE — Telephone Encounter (Signed)
GAVE PATIENT APPOINTMENT FOR 2014 WITH THE LAB ONE WEEK BEFORE

## 2012-09-11 ENCOUNTER — Telehealth: Payer: Self-pay | Admitting: Oncology

## 2012-09-11 NOTE — Telephone Encounter (Signed)
NOT ABLE TO LEAVE MESSAGE

## 2012-09-13 ENCOUNTER — Encounter: Payer: Self-pay | Admitting: Oncology

## 2012-09-14 ENCOUNTER — Telehealth: Payer: Self-pay | Admitting: *Deleted

## 2012-09-14 ENCOUNTER — Encounter: Payer: Self-pay | Admitting: Oncology

## 2012-09-14 ENCOUNTER — Other Ambulatory Visit (HOSPITAL_COMMUNITY): Payer: Self-pay | Admitting: Family Medicine

## 2012-09-14 DIAGNOSIS — Z139 Encounter for screening, unspecified: Secondary | ICD-10-CM

## 2012-09-14 NOTE — Telephone Encounter (Signed)
Pt called stating that she would like to see Dr. Darnelle Catalan.  Cancelled June's appts as she requested.  Confirmed rescheduled 11/08/12 appt w pt.  Mailed letter & calendar to pt.

## 2012-11-01 ENCOUNTER — Ambulatory Visit (HOSPITAL_COMMUNITY): Payer: Medicaid Other

## 2012-11-04 ENCOUNTER — Ambulatory Visit (HOSPITAL_COMMUNITY)
Admission: RE | Admit: 2012-11-04 | Discharge: 2012-11-04 | Disposition: A | Discharge status: Home / Self Care | Payer: Medicaid Other | Source: Ambulatory Visit | Attending: Family Medicine | Admitting: Family Medicine

## 2012-11-04 DIAGNOSIS — Z1231 Encounter for screening mammogram for malignant neoplasm of breast: Secondary | ICD-10-CM | POA: Insufficient documentation

## 2012-11-04 DIAGNOSIS — Z139 Encounter for screening, unspecified: Secondary | ICD-10-CM

## 2012-11-05 ENCOUNTER — Telehealth: Payer: Self-pay | Admitting: *Deleted

## 2012-11-05 NOTE — Telephone Encounter (Signed)
Pt called stating that some tried to call from our facility, but she does not have an answering machine so not sure who it was. Looked in EPIC and there was no note listed of who tired to call her.  Informed her that her appt was Monday and still on as scheduled and verified time with her.  Told her that it could have been our call service reminding her of the appt, but I am not sure.   She requested that if anyone did try to call her and could not get her at home, to please call her cell phone and leave a message there and it will state that you are calling Kerry [name]!!

## 2012-11-08 ENCOUNTER — Ambulatory Visit (HOSPITAL_BASED_OUTPATIENT_CLINIC_OR_DEPARTMENT_OTHER): Payer: Medicaid Other | Admitting: Family

## 2012-11-08 ENCOUNTER — Encounter: Payer: Self-pay | Admitting: Family

## 2012-11-08 VITALS — BP 169/89 | HR 89 | Temp 98.5°F | Resp 20 | Ht 66.0 in | Wt 233.3 lb

## 2012-11-08 DIAGNOSIS — Z87898 Personal history of other specified conditions: Secondary | ICD-10-CM

## 2012-11-08 DIAGNOSIS — C50919 Malignant neoplasm of unspecified site of unspecified female breast: Secondary | ICD-10-CM | POA: Insufficient documentation

## 2012-11-08 DIAGNOSIS — I10 Essential (primary) hypertension: Secondary | ICD-10-CM

## 2012-11-08 DIAGNOSIS — C50912 Malignant neoplasm of unspecified site of left female breast: Secondary | ICD-10-CM

## 2012-11-08 NOTE — Patient Instructions (Addendum)
Please contact us at [phone] if you have any questions or concerns.  Please continue to do well and enjoy life!!!  Get plenty of rest, drink plenty of water, exercise daily (walking for 30 minutes), eat a balanced diet.  Take calcium 600 mg daily in addition to vitamin D3 1000 IUs daily.   Complete monthly self-breast examinations.  Have a clinical breast exam by a physician every year.  Have your mammogram completed every year.  Talk to Dr. Roxy Horseman about a referral to Dr. Burlene Arnt or other mental health professional.

## 2012-11-08 NOTE — Progress Notes (Signed)
[facility]  [phone] [phone]  OFFICE PROGRESS NOTE   ID: Kerry [name]   DOB: 1955-08-14  MR#: [id number]  JYN#:[id number]   PCP: Kerry Stalling, MD SU: Kerry [name]. Kerry Morgans, MD   HISTORY OF PRESENT ILLNESS: From Dr. Theron Arista [name]'s the patient evaluation note dated 05/24/2007: "[name] who presents with a history of DCIS, referred by Dr. Felicity [name]. This is a woman from [place], [place], in previous good health, who had an abnormal screening mammogram on 03/02/07.  This showed calcifications in the left breast.  A diagnostic mammogram performed 03/10/07 showed suspicious calcifications.  Biopsy was recommended.  Biopsy performed on 03/10/07 showed DCIS associated with microcalcifications.  This appeared to be ER/PR positive at 76 and 64%, respectively.  A preoperative MRI scan was performed of both breasts on 03/18/07 and this did show negative bilateral breast MRIs.  The patient elected to undergo mastectomy on 04/13/07.  This was performed with an accompanying lymph node evaluation.  Final pathology from case [id number] showed a 1.6 cm focus of DCIS and immediate grade of necrosis, negative surgical margins.  Two sentinel lymph nodes were negative.  The patient has had an unremarkable postoperative course. She has had some seromatous accumulation of fluid."  Her subsequent history is as detailed below.   INTERVAL HISTORY: Dr. Darnelle [name] and I saw Kerry [name] today for follow up of ductal carcinoma in situ, of the left breast.  The patient was last seen by Dr. Donnie [name] on  12/17/2011.  Since her last office visit, the patient has been doing relatively well.  She is establishing herself with Dr. Darrall [name] service today.   REVIEW OF SYSTEMS: A 10 point review of systems was completed and is negative except occasional left-sided chest wall area swelling, ongoing depression, hot flashes and night sweats.  The patient denies any other  symptomatology.   PAST MEDICAL HISTORY: Past Medical History  Diagnosis Date  . Hypertension   . Coronary artery disease   . Diabetes mellitus   . Breast cancer 03/10/2007    Left breat  . Degenerative disc disease, cervical   . Degenerative disc disease, lumbar   . Fibromyalgia   . Hyperlipidemia   . Carpal tunnel syndrome on right     PAST SURGICAL HISTORY: Past Surgical History  Procedure Laterality Date  . Cardiac surgery    . Appendectomy    . Breast surgery    . Mastectomy Left 03/2007  . Cardiac catheterization  2005  . Vein surgery Right 2005  . Tubal ligation Right   Includes history of artery repair right leg, after cardiac catheterization in 2005.     FAMILY HISTORY Family History  Problem Relation Age of Onset  . Cancer Mother     Breast cancer (right)  . Diabetes Sister   . Cancer Maternal Aunt     Breast cancer  . Cancer Maternal Aunt     Breast cancer  . Cancer Cousin     Breast Cancer  History of kidney tumor in her father.  Breast cancer in her mother at age 73.  Breast cancer in an aunt diagnosed at an early age.  She also has a paternal cousin with history of breast cancer in her 30s.     GYNECOLOGIC HISTORY: Gravida 5, para 2.  Menarche at age 89.  She has been perimenopausal.  She is currently on disability secondary to a back injury.   SOCIAL HISTORY: [name] has been married to her husband comments since 1979.  They have 2 adult sons and one grandson. The patient and her husband are separated and the patient states she has a boyfriend.  She is living with her boyfriend.  The patient has been disabled for a number of years and has not worked since 2005.  The patient enjoys sitting on her porch, gardening in her greenhouse, watching and feeding the birds in her spare time.  She started smoking at the age of 16.  A pack lasts about 3-4 days.     ADVANCED DIRECTIVES: Not on file    HEALTH MAINTENANCE: History  Substance Use Topics   . Smoking status: Current Every Day Smoker -- 0.25 packs/day  . Smokeless tobacco: Never Used  . Alcohol Use: Yes     Comment: Social drinker    Colonoscopy: Not on file  PAP: Not on file  Bone density: Not on file  Lipid panel: Not on file   Allergies  Allergen Reactions  . Oxycodone   . Oxycontin (Oxycodone Hcl)     Current Outpatient Prescriptions  Medication Sig Dispense Refill  . amLODipine (NORVASC) 10 MG tablet Take 10 mg by mouth daily.      . cloNIDine (CATAPRES) 0.1 MG tablet Take 0.1 mg by mouth 2 (two) times daily.      Marland Kitchen glipiZIDE (GLUCOTROL XL) 10 MG 24 hr tablet Take 10 mg by mouth daily.      . insulin glargine (LANTUS) 100 UNIT/ML injection Inject 20 Units into the skin at bedtime.      Marland Kitchen LISINOPRIL PO Take 20 mg by mouth daily.       Marland Kitchen LORazepam (ATIVAN) 1 MG tablet Take 1 mg by mouth at bedtime.      . metoprolol succinate (TOPROL-XL) 50 MG 24 hr tablet Take 50 mg by mouth daily. Take with or immediately following a meal.      . oxyCODONE-acetaminophen (PERCOCET) 5-325 MG per tablet Take 1 tablet by mouth daily as needed.      Marland Kitchen PARoxetine (PAXIL) 20 MG tablet Take 20 mg by mouth every morning.      . pravastatin (PRAVACHOL) 40 MG tablet Take 40 mg by mouth daily.      . SUMAtriptan (IMITREX) 100 MG tablet Take 100 mg by mouth daily.       No current facility-administered medications for this visit.    OBJECTIVE: Filed Vitals:   11/08/12 0858  BP: 169/89  Pulse: 89  Temp: 98.5 F (36.9 C)  Resp: 20     Body mass index is 37.67 kg/(m^2).      ECOG FS: 1 - Symptomatic but completely ambulatory  General appearance: Alert, cooperative, well nourished, no apparent distress Head: Normocephalic, without obvious abnormality, atraumatic Eyes: Conjunctivae/corneas clear, PERRLA, EOMI Nose: Nares, septum and mucosa are normal, no drainage or sinus tenderness Neck: No adenopathy, supple, symmetrical, trachea midline, thyroid not enlarged, no  tenderness Resp: Clear to auscultation bilaterally Cardio: Regular rate and rhythm, S1, S2 normal, no murmur, click, rub or gallop Breasts: Left breast is surgically absent, left chest wall area has well-healed surgical scars, right breast is pendulous and has glandular breast tissue, no nipple inversion, right breast firm medial inframammary ridge, bilateral axillary fullness GI: Soft, distended, non-tender, hypoactive bowel sounds, excessive habitus Skin: The patient has numerous scars and lacerations on bilateral upper extremities and lower extremities in various stages of healing, tattoos Extremities: Extremities normal, atraumatic, no cyanosis or edema Lymph  nodes: Cervical, supraclavicular, and axillary nodes normal Neurologic: Grossly normal   LAB RESULTS: Lab Results  Component Value Date   WBC 10.6* 12/17/2011   NEUTROABS 6.6* 12/17/2011   HGB 14.6 12/17/2011   HCT 43.2 12/17/2011   MCV 86.0 12/17/2011   PLT 239 12/17/2011      Chemistry      Component Value Date/Time   NA 142 12/17/2011 1528   K 3.7 12/17/2011 1528   CL 106 12/17/2011 1528   CO2 25 12/17/2011 1528   BUN 11 12/17/2011 1528   CREATININE 0.59 12/17/2011 1528      Component Value Date/Time   CALCIUM 9.6 12/17/2011 1528   ALKPHOS 77 12/17/2011 1528   AST 12 12/17/2011 1528   ALT 15 12/17/2011 1528   BILITOT 0.2* 12/17/2011 1528      Lab Results  Component Value Date   LABCA2 18 10/02/2008    Urinalysis No results found for this basename: colorurine,  appearanceur,  labspec,  phurine,  glucoseu,  hgbur,  bilirubinur,  ketonesur,  proteinur,  urobilinogen,  nitrite,  leukocytesur    STUDIES: Mm Digital Screening Unilat R 11/05/2012   *RADIOLOGY REPORT*  Clinical Data: Screening.  MAMMOGRAPHIC UNILATERAL RIGHT DIGITAL SCREENING WITH CAD  Comparison:  Previous exams.  FINDINGS:  ACR Breast Density Category 2: There is a scattered fibroglandular pattern.  No suspicious masses, architectural distortion, or  calcifications are present.  Images were processed with CAD.  IMPRESSION: No mammographic evidence of malignancy.  A result letter of this screening mammogram will be mailed directly to the patient.  RECOMMENDATION: Screening mammogram in one year. (Code:SM-B-01Y)  BI-RADS CATEGORY 1:  Negative.   Original Report Authenticated By: Sherian Rein, M.D.    ASSESSMENT: 56 y.o. [place], [place] woman: 1.  Status post left breast upper outer quadrant needle core biopsy on 03/10/2007 that showed ductal carcinoma in situ associated with microcalcifications, ER 76%, PR 54%.  2.  Status post bilateral breast MRI on 03/18/2007 which showed no suspicious masses or enhancements were seen bilaterally. There were no correlating abnormalities in the left upper outer quadrant in the region of the DCIS biopsy.  No axillary adenopathy was present bilaterally.  3.  Status post left breast simple mastectomy with left axillary sentinel node biopsy on 04/13/2007 for a stage 0, pTis, 1.6 cm ductal carcinoma in situ, intermediate grade with necrosis and microcalcifications, with extension into the lactiferous duct, and negative surgical margins, ER 76%, PR 54%, and 0/2 positive lymph nodes.  4.  Genetics letter dated 12/03/2007 stated the patient was negative for the BRCA1 and BRCA2 gene mutations.  5.  Elevated blood pressure  PLAN: The patient is nearing 6  years from her time of diagnosis and will officially become a graduate of [employer]'s breast cancer program today. The patient's husband Maisie Fus was present for her graduation ceremony. We asked that she continue annual clinical breast examinations by a physician in addition to annual mammography.  Her last mammogram on 11/05/2012 showed no mammographic evidence of malignancy. She is due for her annual mammogram in 11/09/13.  With regards to the patient's elevated blood pressure, she states that she had not taking her blood pressure medications today.  All  questions were answered.  The patient was encouraged to contact us with any problems, questions or concerns.   Larina Bras, NP-C 11/08/2012, 4:54 PM

## 2012-12-10 ENCOUNTER — Other Ambulatory Visit: Payer: Medicaid Other | Admitting: Lab

## 2012-12-17 ENCOUNTER — Ambulatory Visit: Payer: Medicaid Other | Admitting: Family

## 2012-12-17 ENCOUNTER — Ambulatory Visit: Payer: Medicaid Other | Admitting: Oncology

## 2013-12-06 ENCOUNTER — Other Ambulatory Visit (HOSPITAL_COMMUNITY): Payer: Self-pay | Admitting: Family Medicine

## 2013-12-06 DIAGNOSIS — Z1231 Encounter for screening mammogram for malignant neoplasm of breast: Secondary | ICD-10-CM

## 2013-12-08 ENCOUNTER — Ambulatory Visit (HOSPITAL_COMMUNITY)
Admission: RE | Admit: 2013-12-08 | Discharge: 2013-12-08 | Disposition: A | Discharge status: Home / Self Care | Payer: Medicaid Other | Source: Ambulatory Visit | Attending: Family Medicine | Admitting: Family Medicine

## 2013-12-08 DIAGNOSIS — Z1231 Encounter for screening mammogram for malignant neoplasm of breast: Secondary | ICD-10-CM

## 2014-02-21 ENCOUNTER — Encounter (HOSPITAL_COMMUNITY): Payer: Self-pay | Admitting: Emergency Medicine

## 2014-02-21 ENCOUNTER — Emergency Department (HOSPITAL_COMMUNITY)
Admission: EM | Admit: 2014-02-21 | Discharge: 2014-02-22 | Disposition: A | Discharge status: Home / Self Care | Payer: Medicaid Other | Attending: Emergency Medicine | Admitting: Emergency Medicine

## 2014-02-21 ENCOUNTER — Emergency Department (HOSPITAL_COMMUNITY): Payer: Medicaid Other

## 2014-02-21 DIAGNOSIS — R1012 Left upper quadrant pain: Secondary | ICD-10-CM

## 2014-02-21 DIAGNOSIS — E785 Hyperlipidemia, unspecified: Secondary | ICD-10-CM | POA: Insufficient documentation

## 2014-02-21 DIAGNOSIS — E119 Type 2 diabetes mellitus without complications: Secondary | ICD-10-CM | POA: Insufficient documentation

## 2014-02-21 DIAGNOSIS — F17200 Nicotine dependence, unspecified, uncomplicated: Secondary | ICD-10-CM | POA: Insufficient documentation

## 2014-02-21 DIAGNOSIS — Z79899 Other long term (current) drug therapy: Secondary | ICD-10-CM | POA: Diagnosis not present

## 2014-02-21 DIAGNOSIS — C50919 Malignant neoplasm of unspecified site of unspecified female breast: Secondary | ICD-10-CM

## 2014-02-21 DIAGNOSIS — Z9889 Other specified postprocedural states: Secondary | ICD-10-CM | POA: Diagnosis not present

## 2014-02-21 DIAGNOSIS — I2510 Atherosclerotic heart disease of native coronary artery without angina pectoris: Secondary | ICD-10-CM | POA: Diagnosis not present

## 2014-02-21 DIAGNOSIS — Z8669 Personal history of other diseases of the nervous system and sense organs: Secondary | ICD-10-CM | POA: Diagnosis not present

## 2014-02-21 DIAGNOSIS — E559 Vitamin D deficiency, unspecified: Secondary | ICD-10-CM

## 2014-02-21 DIAGNOSIS — R079 Chest pain, unspecified: Secondary | ICD-10-CM | POA: Diagnosis present

## 2014-02-21 DIAGNOSIS — I10 Essential (primary) hypertension: Secondary | ICD-10-CM | POA: Diagnosis not present

## 2014-02-21 DIAGNOSIS — R0789 Other chest pain: Secondary | ICD-10-CM | POA: Insufficient documentation

## 2014-02-21 DIAGNOSIS — Z8739 Personal history of other diseases of the musculoskeletal system and connective tissue: Secondary | ICD-10-CM | POA: Diagnosis not present

## 2014-02-21 DIAGNOSIS — R739 Hyperglycemia, unspecified: Secondary | ICD-10-CM

## 2014-02-21 DIAGNOSIS — Z853 Personal history of malignant neoplasm of breast: Secondary | ICD-10-CM | POA: Diagnosis not present

## 2014-02-21 LAB — CBC
HEMATOCRIT: 49.7 % — AB (ref 36.0–46.0)
Hemoglobin: 16.8 g/dL — ABNORMAL HIGH (ref 12.0–15.0)
MCH: 29.5 pg (ref 26.0–34.0)
MCHC: 33.8 g/dL (ref 30.0–36.0)
MCV: 87.2 fL (ref 78.0–100.0)
Platelets: 260 10*3/uL (ref 150–400)
RBC: 5.7 MIL/uL — AB (ref 3.87–5.11)
RDW: 13.1 % (ref 11.5–15.5)
WBC: 11.3 10*3/uL — AB (ref 4.0–10.5)

## 2014-02-21 LAB — BASIC METABOLIC PANEL
Anion gap: 18 — ABNORMAL HIGH (ref 5–15)
BUN: 9 mg/dL (ref 6–23)
CHLORIDE: 96 meq/L (ref 96–112)
CO2: 22 meq/L (ref 19–32)
CREATININE: 0.52 mg/dL (ref 0.50–1.10)
Calcium: 9.3 mg/dL (ref 8.4–10.5)
GFR calc Af Amer: >90 mL/min (ref >90)
GFR calc non Af Amer: >90 mL/min (ref >90)
Glucose, Bld: 323 mg/dL — ABNORMAL HIGH (ref 70–99)
Potassium: 4 mEq/L (ref 3.7–5.3)
Sodium: 136 mEq/L — ABNORMAL LOW (ref 137–147)

## 2014-02-21 LAB — I-STAT TROPONIN, ED: Troponin i, poc: 0 ng/mL (ref 0.00–0.08)

## 2014-02-21 LAB — LIPASE, BLOOD: LIPASE: 12 U/L (ref 11–59)

## 2014-02-21 MED ORDER — IOHEXOL 300 MG/ML  SOLN
25.0000 mL | INTRAMUSCULAR | Status: AC
  Administered 2014-02-21 (×2): 25 mL via ORAL

## 2014-02-21 MED ORDER — FENTANYL CITRATE 0.05 MG/ML IJ SOLN
50.0000 ug | Freq: Once | INTRAMUSCULAR | Status: AC
  Administered 2014-02-21: 50 ug via INTRAVENOUS
  Filled 2014-02-21: qty 2

## 2014-02-21 MED ORDER — SODIUM CHLORIDE 0.9 % IV SOLN
1000.0000 mL | INTRAVENOUS | Status: DC
  Administered 2014-02-21: 1000 mL via INTRAVENOUS

## 2014-02-21 MED ORDER — ONDANSETRON HCL 4 MG/2ML IJ SOLN
4.0000 mg | Freq: Once | INTRAMUSCULAR | Status: AC
  Administered 2014-02-21: 4 mg via INTRAVENOUS
  Filled 2014-02-21: qty 2

## 2014-02-21 MED ORDER — OXYCODONE-ACETAMINOPHEN 5-325 MG PO TABS: 1.0000 | ORAL_TABLET | Freq: Four times a day (QID) | ORAL | Status: DC | PRN

## 2014-02-21 NOTE — ED Notes (Signed)
Patient returned from CT

## 2014-02-21 NOTE — ED Notes (Addendum)
Patient transported to CT 

## 2014-02-21 NOTE — ED Provider Notes (Addendum)
CSN: [id number]     Arrival date & time 02/21/14  1922 History   First MD Initiated Contact with Patient 02/21/14 2002     Chief Complaint  Patient presents with  . Chest Pain     (Consider location/radiation/quality/duration/timing/severity/associated sxs/prior Treatment) HPI Patient presents with multiple complaints.  The patient was in her usual state of health until 4 days ago.  About that time she developed left-sided chest pain, left upper abdominal pain.  Since, patient also developed nausea, vomiting or diarrhea.  She has had innumerable episodes of diarrhea, increasing fatigue, nausea, anorexia as well as disequilibrium. She denies focal chest pain currently, dyspnea currently, fever, chills. The patient notes multiple medical problems, with poor medication compliance.  Past Medical History  Diagnosis Date  . Hypertension   . Coronary artery disease   . Diabetes mellitus   . Breast cancer 03/10/2007    Left breat  . Degenerative disc disease, cervical   . Degenerative disc disease, lumbar   . Fibromyalgia   . Hyperlipidemia   . Carpal tunnel syndrome on right    Past Surgical History  Procedure Laterality Date  . Cardiac surgery    . Appendectomy    . Breast surgery    . Mastectomy Left 03/2007  . Cardiac catheterization  2005  . Vein surgery Right 2005  . Tubal ligation Right    Family History  Problem Relation Age of Onset  . Cancer Mother     Breast cancer (right)  . Diabetes Sister   . Cancer Maternal Aunt     Breast cancer  . Cancer Maternal Aunt     Breast cancer  . Cancer Cousin     Breast Cancer   History  Substance Use Topics  . Smoking status: Current Every Day Smoker -- 0.25 packs/day  . Smokeless tobacco: Never Used  . Alcohol Use: Yes     Comment: Social drinker   OB History   Grav Para Term Preterm Abortions TAB SAB Ect Mult Living                 Review of Systems  All other systems reviewed and are negative.     Allergies   Oxycodone and Oxycontin  Home Medications   Prior to Admission medications   Medication Sig Start Date End Date Taking? Authorizing Provider  amLODipine (NORVASC) 10 MG tablet Take 10 mg by mouth daily.    Historical Provider, MD  cloNIDine (CATAPRES) 0.1 MG tablet Take 0.1 mg by mouth 2 (two) times daily.    Historical Provider, MD  glipiZIDE (GLUCOTROL XL) 10 MG 24 hr tablet Take 10 mg by mouth daily.    Historical Provider, MD  insulin glargine (LANTUS) 100 UNIT/ML injection Inject 20 Units into the skin at bedtime.    Historical Provider, MD  LISINOPRIL PO Take 20 mg by mouth daily.     Historical Provider, MD  LORazepam (ATIVAN) 1 MG tablet Take 1 mg by mouth at bedtime.    Historical Provider, MD  metoprolol succinate (TOPROL-XL) 50 MG 24 hr tablet Take 50 mg by mouth daily. Take with or immediately following a meal.    Historical Provider, MD  oxyCODONE-acetaminophen (PERCOCET) 5-325 MG per tablet Take 1 tablet by mouth daily as needed.    Historical Provider, MD  PARoxetine (PAXIL) 20 MG tablet Take 20 mg by mouth every morning.    Historical Provider, MD  pravastatin (PRAVACHOL) 40 MG tablet Take 40 mg by mouth daily.  Historical Provider, MD  SUMAtriptan (IMITREX) 100 MG tablet Take 100 mg by mouth daily.    Historical Provider, MD   BP 150/75  Pulse 85  Temp(Src) 98.4 F (36.9 C) (Oral)  Resp 14  Ht 5\' 7"  (1.702 m)  Wt 206 lb (93.441 kg)  BMI 32.26 kg/m2  SpO2 98% Physical Exam  Nursing note and vitals reviewed. Constitutional: She is oriented to person, place, and time. She appears well-developed and well-nourished.  HENT:  Head: Normocephalic and atraumatic.  Eyes: Conjunctivae and EOM are normal.  Cardiovascular: Normal rate and regular rhythm.   Pulmonary/Chest: Effort normal and breath sounds normal. No stridor. No respiratory distress.  Abdominal: She exhibits no distension.  Non-peritoneal - no rebound, no guarding. Mild TTP on l side  Musculoskeletal: She  exhibits no edema.  Neurological: She is alert and oriented to person, place, and time. No cranial nerve deficit.  Skin: Skin is warm and dry.  Psychiatric: She has a normal mood and affect.    ED Course  Procedures (including critical care time) Labs Review Labs Reviewed  CBC - Abnormal; Notable for the following:    WBC 11.3 (*)    RBC 5.70 (*)    Hemoglobin 16.8 (*)    HCT 49.7 (*)    All other components within normal limits  BASIC METABOLIC PANEL - Abnormal; Notable for the following:    Sodium 136 (*)    Glucose, Bld 323 (*)    Anion gap 18 (*)    All other components within normal limits  LIPASE, BLOOD  URINALYSIS, ROUTINE W REFLEX MICROSCOPIC  I-STAT TROPOININ, ED    Imaging Review Ct Abdomen Pelvis Wo Contrast  02/21/2014   CLINICAL DATA:  L CHEST PAIN LUQ pain, diarrhea.  EXAM: CT ABDOMEN AND PELVIS WITHOUT CONTRAST  TECHNIQUE: Multidetector CT imaging of the abdomen and pelvis was performed following the standard protocol without IV contrast.  COMPARISON:  None.  FINDINGS: Left mastectomy. Normal heart size. Mild bibasilar atelectasis or scarring. Small pericardial effusion.  Organ evaluation is limited without intravenous contrast. Within this limitation, hepatic steatosis.  Splenic granuloma.  No appreciable abnormality of the pancreas, biliary system, left adrenal gland. Tiny right adrenal body nodule is favored to reflect an adenoma.  Symmetric renal size. Interpolar right renal cyst posteriorly. No hydroureteronephrosis.  No overt colitis. Appendix not identified. No right lower quadrant inflammation. Small bowel loops are of normal course and caliber. No free intraperitoneal air or fluid. No lymphadenopathy.  Scattered atherosclerotic disease of the aorta and branch vessels without aneurysmal dilatation.  Thin walled bladder measures higher in attenuation than that of simple fluid, nonspecific. No perivesicular fat stranding. No appreciable abnormality of the uterus and  right adnexa. There is a left adnexal/ovarian cyst measuring 5.3 cm.  Multilevel degenerative changes. L5-S1 broad-based disc bulge results in mild-to-moderate central canal narrowing. Multilevel lower thoracic/ upper lumbar posterior disc osteophyte complexes. No acute osseous finding.  IMPRESSION: Within limitations of a noncontrast exam, no acute abdominopelvic process identified.  Hepatic steatosis.  5.3 cm left ovarian cyst. Recommend further evaluation with pelvic ultrasound.   Electronically Signed   By: Carlos Levering M.D.   On: 02/21/2014 23:09     EKG Interpretation   Date/Time:  Tuesday February 21 2014 19:29:33 EDT Ventricular Rate:  88 PR Interval:  144 QRS Duration: 82 QT Interval:  386 QTC Calculation: 467 R Axis:   43 Text Interpretation:  Normal sinus rhythm Normal ECG Sinus rhythm No  significant  change since last tracing Normal ECG Confirmed by Carmin Muskrat  MD [phone]) on 02/21/2014 8:15:47 PM     11:55 PM Patient is resting, hemodynamically stable, lying in a decubitus position. She, her daughter and I discussed all findings, the need for followup for better control of her glucose. We also discussed the need for outpatient evaluation of her ovarian cyst.  MDM   Final diagnoses:  Left upper quadrant pain  Chest pain, unspecified chest pain type  Hyperglycemia   Patient presents with ongoing left lower chest and left upper abdominal pain, nausea, vomiting, headache. Patient has been very poorly controlled and diabetes, as well as fibromyalgia, hypertension. Patient's evaluation here is reassuring from a coronary perspective, with low suspicion for ongoing ischemia and no suspicion for ongoing infection as well. Patient improved from a pain control standpoint, remained hemodynamically stable, and will followup with her primary care physician for further evaluation and management and consideration of additional medications for her disease processes.  Carmin Muskrat, MD 02/21/14 [place], MD 03/07/14 [phone]

## 2014-02-21 NOTE — ED Notes (Signed)
Pt c/o intermittent left sided chest pain for 2-3 days. Pt also c/o n/v/d, persistent dry heaves and HA with the chest pain. Pt denies shortness of breath, diaphoresis. Pt also c/o fingers and toes tingling.

## 2014-02-21 NOTE — Discharge Instructions (Signed)
As discussed, it is important that you follow up as soon as possible with your physician for continued management of your condition. ° °If you develop any new, or concerning changes in your condition, please return to the emergency department immediately. ° °

## 2015-04-11 ENCOUNTER — Emergency Department (HOSPITAL_COMMUNITY): Payer: 59

## 2015-04-11 ENCOUNTER — Encounter (HOSPITAL_COMMUNITY)
Admission: EM | Disposition: A | Discharge status: Other Acute Inpatient Hospital | Payer: Self-pay | Source: Home / Self Care | Attending: Neurology

## 2015-04-11 ENCOUNTER — Emergency Department (HOSPITAL_COMMUNITY): Payer: 59 | Admitting: Certified Registered"

## 2015-04-11 ENCOUNTER — Inpatient Hospital Stay (HOSPITAL_COMMUNITY)
Admission: EM | Admit: 2015-04-11 | Discharge: 2015-04-27 | DRG: 003 | Disposition: A | Discharge status: Other Acute Inpatient Hospital | Payer: 59 | Attending: Neurology | Admitting: Neurology

## 2015-04-11 ENCOUNTER — Encounter (HOSPITAL_COMMUNITY): Payer: Self-pay

## 2015-04-11 ENCOUNTER — Inpatient Hospital Stay (HOSPITAL_COMMUNITY): Payer: 59

## 2015-04-11 DIAGNOSIS — E1165 Type 2 diabetes mellitus with hyperglycemia: Secondary | ICD-10-CM | POA: Diagnosis present

## 2015-04-11 DIAGNOSIS — I63032 Cerebral infarction due to thrombosis of left carotid artery: Secondary | ICD-10-CM | POA: Diagnosis not present

## 2015-04-11 DIAGNOSIS — Z6832 Body mass index (BMI) 32.0-32.9, adult: Secondary | ICD-10-CM

## 2015-04-11 DIAGNOSIS — Z87898 Personal history of other specified conditions: Secondary | ICD-10-CM | POA: Diagnosis not present

## 2015-04-11 DIAGNOSIS — E1100 Type 2 diabetes mellitus with hyperosmolarity without nonketotic hyperglycemic-hyperosmolar coma (NKHHC): Secondary | ICD-10-CM | POA: Insufficient documentation

## 2015-04-11 DIAGNOSIS — Z9119 Patient's noncompliance with other medical treatment and regimen: Secondary | ICD-10-CM

## 2015-04-11 DIAGNOSIS — J9690 Respiratory failure, unspecified, unspecified whether with hypoxia or hypercapnia: Secondary | ICD-10-CM | POA: Insufficient documentation

## 2015-04-11 DIAGNOSIS — E669 Obesity, unspecified: Secondary | ICD-10-CM | POA: Diagnosis present

## 2015-04-11 DIAGNOSIS — I63412 Cerebral infarction due to embolism of left middle cerebral artery: Principal | ICD-10-CM | POA: Diagnosis present

## 2015-04-11 DIAGNOSIS — R059 Cough, unspecified: Secondary | ICD-10-CM

## 2015-04-11 DIAGNOSIS — Y848 Other medical procedures as the cause of abnormal reaction of the patient, or of later complication, without mention of misadventure at the time of the procedure: Secondary | ICD-10-CM | POA: Diagnosis not present

## 2015-04-11 DIAGNOSIS — I2510 Atherosclerotic heart disease of native coronary artery without angina pectoris: Secondary | ICD-10-CM | POA: Diagnosis present

## 2015-04-11 DIAGNOSIS — I619 Nontraumatic intracerebral hemorrhage, unspecified: Secondary | ICD-10-CM | POA: Insufficient documentation

## 2015-04-11 DIAGNOSIS — Z9012 Acquired absence of left breast and nipple: Secondary | ICD-10-CM

## 2015-04-11 DIAGNOSIS — B9620 Unspecified Escherichia coli [E. coli] as the cause of diseases classified elsewhere: Secondary | ICD-10-CM | POA: Diagnosis not present

## 2015-04-11 DIAGNOSIS — R29716 NIHSS score 16: Secondary | ICD-10-CM | POA: Diagnosis present

## 2015-04-11 DIAGNOSIS — G936 Cerebral edema: Secondary | ICD-10-CM | POA: Diagnosis not present

## 2015-04-11 DIAGNOSIS — I63512 Cerebral infarction due to unspecified occlusion or stenosis of left middle cerebral artery: Secondary | ICD-10-CM | POA: Insufficient documentation

## 2015-04-11 DIAGNOSIS — Z794 Long term (current) use of insulin: Secondary | ICD-10-CM | POA: Diagnosis not present

## 2015-04-11 DIAGNOSIS — I6300 Cerebral infarction due to thrombosis of unspecified precerebral artery: Secondary | ICD-10-CM | POA: Diagnosis not present

## 2015-04-11 DIAGNOSIS — T45615A Adverse effect of thrombolytic drugs, initial encounter: Secondary | ICD-10-CM | POA: Diagnosis not present

## 2015-04-11 DIAGNOSIS — I63312 Cerebral infarction due to thrombosis of left middle cerebral artery: Secondary | ICD-10-CM | POA: Diagnosis not present

## 2015-04-11 DIAGNOSIS — I10 Essential (primary) hypertension: Secondary | ICD-10-CM

## 2015-04-11 DIAGNOSIS — R001 Bradycardia, unspecified: Secondary | ICD-10-CM | POA: Diagnosis not present

## 2015-04-11 DIAGNOSIS — E785 Hyperlipidemia, unspecified: Secondary | ICD-10-CM | POA: Diagnosis present

## 2015-04-11 DIAGNOSIS — R4701 Aphasia: Secondary | ICD-10-CM | POA: Diagnosis present

## 2015-04-11 DIAGNOSIS — I498 Other specified cardiac arrhythmias: Secondary | ICD-10-CM | POA: Diagnosis not present

## 2015-04-11 DIAGNOSIS — Y95 Nosocomial condition: Secondary | ICD-10-CM | POA: Diagnosis not present

## 2015-04-11 DIAGNOSIS — I611 Nontraumatic intracerebral hemorrhage in hemisphere, cortical: Secondary | ICD-10-CM | POA: Diagnosis not present

## 2015-04-11 DIAGNOSIS — T41295A Adverse effect of other general anesthetics, initial encounter: Secondary | ICD-10-CM | POA: Diagnosis not present

## 2015-04-11 DIAGNOSIS — I160 Hypertensive urgency: Secondary | ICD-10-CM | POA: Diagnosis not present

## 2015-04-11 DIAGNOSIS — N390 Urinary tract infection, site not specified: Secondary | ICD-10-CM | POA: Diagnosis not present

## 2015-04-11 DIAGNOSIS — Z7984 Long term (current) use of oral hypoglycemic drugs: Secondary | ICD-10-CM

## 2015-04-11 DIAGNOSIS — G8101 Flaccid hemiplegia affecting right dominant side: Secondary | ICD-10-CM | POA: Diagnosis present

## 2015-04-11 DIAGNOSIS — R29810 Facial weakness: Secondary | ICD-10-CM | POA: Diagnosis present

## 2015-04-11 DIAGNOSIS — E876 Hypokalemia: Secondary | ICD-10-CM | POA: Insufficient documentation

## 2015-04-11 DIAGNOSIS — I6789 Other cerebrovascular disease: Secondary | ICD-10-CM | POA: Diagnosis not present

## 2015-04-11 DIAGNOSIS — J988 Other specified respiratory disorders: Secondary | ICD-10-CM | POA: Insufficient documentation

## 2015-04-11 DIAGNOSIS — J15211 Pneumonia due to Methicillin susceptible Staphylococcus aureus: Secondary | ICD-10-CM | POA: Diagnosis not present

## 2015-04-11 DIAGNOSIS — D649 Anemia, unspecified: Secondary | ICD-10-CM | POA: Diagnosis not present

## 2015-04-11 DIAGNOSIS — I63039 Cerebral infarction due to thrombosis of unspecified carotid artery: Secondary | ICD-10-CM | POA: Diagnosis not present

## 2015-04-11 DIAGNOSIS — Z452 Encounter for adjustment and management of vascular access device: Secondary | ICD-10-CM | POA: Diagnosis not present

## 2015-04-11 DIAGNOSIS — R1310 Dysphagia, unspecified: Secondary | ICD-10-CM | POA: Diagnosis present

## 2015-04-11 DIAGNOSIS — I63232 Cerebral infarction due to unspecified occlusion or stenosis of left carotid arteries: Secondary | ICD-10-CM | POA: Diagnosis present

## 2015-04-11 DIAGNOSIS — I509 Heart failure, unspecified: Secondary | ICD-10-CM

## 2015-04-11 DIAGNOSIS — J189 Pneumonia, unspecified organism: Secondary | ICD-10-CM | POA: Insufficient documentation

## 2015-04-11 DIAGNOSIS — E1151 Type 2 diabetes mellitus with diabetic peripheral angiopathy without gangrene: Secondary | ICD-10-CM | POA: Diagnosis present

## 2015-04-11 DIAGNOSIS — Z833 Family history of diabetes mellitus: Secondary | ICD-10-CM | POA: Diagnosis not present

## 2015-04-11 DIAGNOSIS — Z853 Personal history of malignant neoplasm of breast: Secondary | ICD-10-CM | POA: Diagnosis not present

## 2015-04-11 DIAGNOSIS — F17200 Nicotine dependence, unspecified, uncomplicated: Secondary | ICD-10-CM | POA: Diagnosis present

## 2015-04-11 DIAGNOSIS — I455 Other specified heart block: Secondary | ICD-10-CM | POA: Diagnosis not present

## 2015-04-11 DIAGNOSIS — G935 Compression of brain: Secondary | ICD-10-CM | POA: Diagnosis not present

## 2015-04-11 DIAGNOSIS — Z885 Allergy status to narcotic agent status: Secondary | ICD-10-CM | POA: Diagnosis not present

## 2015-04-11 DIAGNOSIS — M797 Fibromyalgia: Secondary | ICD-10-CM | POA: Diagnosis present

## 2015-04-11 DIAGNOSIS — I639 Cerebral infarction, unspecified: Secondary | ICD-10-CM | POA: Diagnosis present

## 2015-04-11 DIAGNOSIS — E1159 Type 2 diabetes mellitus with other circulatory complications: Secondary | ICD-10-CM | POA: Insufficient documentation

## 2015-04-11 DIAGNOSIS — Z803 Family history of malignant neoplasm of breast: Secondary | ICD-10-CM

## 2015-04-11 DIAGNOSIS — J9600 Acute respiratory failure, unspecified whether with hypoxia or hypercapnia: Secondary | ICD-10-CM | POA: Diagnosis not present

## 2015-04-11 DIAGNOSIS — Z9911 Dependence on respirator [ventilator] status: Secondary | ICD-10-CM

## 2015-04-11 DIAGNOSIS — I161 Hypertensive emergency: Secondary | ICD-10-CM | POA: Diagnosis present

## 2015-04-11 DIAGNOSIS — I469 Cardiac arrest, cause unspecified: Secondary | ICD-10-CM | POA: Diagnosis not present

## 2015-04-11 DIAGNOSIS — J9601 Acute respiratory failure with hypoxia: Secondary | ICD-10-CM | POA: Diagnosis not present

## 2015-04-11 DIAGNOSIS — G8100 Flaccid hemiplegia affecting unspecified side: Secondary | ICD-10-CM | POA: Diagnosis present

## 2015-04-11 DIAGNOSIS — R05 Cough: Secondary | ICD-10-CM

## 2015-04-11 DIAGNOSIS — Z4659 Encounter for fitting and adjustment of other gastrointestinal appliance and device: Secondary | ICD-10-CM | POA: Insufficient documentation

## 2015-04-11 DIAGNOSIS — J9610 Chronic respiratory failure, unspecified whether with hypoxia or hypercapnia: Secondary | ICD-10-CM | POA: Diagnosis not present

## 2015-04-11 DIAGNOSIS — Y752 Prosthetic and other implants, materials and neurological devices associated with adverse incidents: Secondary | ICD-10-CM | POA: Diagnosis not present

## 2015-04-11 HISTORY — PX: RADIOLOGY WITH ANESTHESIA: SHX6223

## 2015-04-11 LAB — DIFFERENTIAL
Basophils Absolute: 0 10*3/uL (ref 0.0–0.1)
Basophils Relative: 0 %
EOS PCT: 2 %
Eosinophils Absolute: 0.2 10*3/uL (ref 0.0–0.7)
Lymphocytes Relative: 24 %
Lymphs Abs: 2.3 10*3/uL (ref 0.7–4.0)
MONO ABS: 0.5 10*3/uL (ref 0.1–1.0)
MONOS PCT: 5 %
NEUTROS ABS: 6.4 10*3/uL (ref 1.7–7.7)
Neutrophils Relative %: 69 %

## 2015-04-11 LAB — URINALYSIS, ROUTINE W REFLEX MICROSCOPIC
Bilirubin Urine: NEGATIVE
Glucose, UA: >1000 mg/dL — AB
Ketones, ur: NEGATIVE mg/dL
NITRITE: POSITIVE — AB
PH: 6 (ref 5.0–8.0)
Protein, ur: NEGATIVE mg/dL
SPECIFIC GRAVITY, URINE: 1.02 (ref 1.005–1.030)
UROBILINOGEN UA: 0.2 mg/dL (ref 0.0–1.0)

## 2015-04-11 LAB — URINE MICROSCOPIC-ADD ON

## 2015-04-11 LAB — BLOOD GAS, ARTERIAL
ACID-BASE DEFICIT: 3.7 mmol/L — AB (ref 0.0–2.0)
Bicarbonate: 20.1 mEq/L (ref 20.0–24.0)
Drawn by: 313941
FIO2: 1
LHR: 16 {breaths}/min
MECHVT: 480 mL
O2 Saturation: 99.6 %
PATIENT TEMPERATURE: 98.6
PCO2 ART: 32.5 mmHg — AB (ref 35.0–45.0)
PEEP/CPAP: 5 cmH2O
PH ART: 7.408 (ref 7.350–7.450)
PO2 ART: 368 mmHg — AB (ref 80.0–100.0)
TCO2: 21.1 mmol/L (ref 0–100)

## 2015-04-11 LAB — COMPREHENSIVE METABOLIC PANEL
ALT: 16 U/L (ref 14–54)
ANION GAP: 8 (ref 5–15)
AST: 15 U/L (ref 15–41)
Albumin: 3.6 g/dL (ref 3.5–5.0)
Alkaline Phosphatase: 66 U/L (ref 38–126)
BUN: 9 mg/dL (ref 6–20)
CALCIUM: 9.1 mg/dL (ref 8.9–10.3)
CHLORIDE: 102 mmol/L (ref 101–111)
CO2: 25 mmol/L (ref 22–32)
CREATININE: 0.62 mg/dL (ref 0.44–1.00)
GFR calc Af Amer: >60 mL/min (ref >60)
Glucose, Bld: 325 mg/dL — ABNORMAL HIGH (ref 65–99)
Potassium: 4.1 mmol/L (ref 3.5–5.1)
Sodium: 135 mmol/L (ref 135–145)
Total Bilirubin: 0.4 mg/dL (ref 0.3–1.2)
Total Protein: 6.9 g/dL (ref 6.5–8.1)

## 2015-04-11 LAB — CBC
HEMATOCRIT: 44.8 % (ref 36.0–46.0)
Hemoglobin: 15.2 g/dL — ABNORMAL HIGH (ref 12.0–15.0)
MCH: 29.3 pg (ref 26.0–34.0)
MCHC: 33.9 g/dL (ref 30.0–36.0)
MCV: 86.3 fL (ref 78.0–100.0)
Platelets: 192 10*3/uL (ref 150–400)
RBC: 5.19 MIL/uL — ABNORMAL HIGH (ref 3.87–5.11)
RDW: 13.1 % (ref 11.5–15.5)
WBC: 9.4 10*3/uL (ref 4.0–10.5)

## 2015-04-11 LAB — GLUCOSE, CAPILLARY
Glucose-Capillary: 295 mg/dL — ABNORMAL HIGH (ref 65–99)
Glucose-Capillary: 254 mg/dL — ABNORMAL HIGH (ref 65–99)

## 2015-04-11 LAB — MRSA PCR SCREENING: MRSA by PCR: NEGATIVE

## 2015-04-11 LAB — PROTIME-INR
INR: 1 (ref 0.00–1.49)
PROTHROMBIN TIME: 13.4 s (ref 11.6–15.2)

## 2015-04-11 LAB — APTT: aPTT: 24 seconds (ref 24–37)

## 2015-04-11 LAB — I-STAT TROPONIN, ED: TROPONIN I, POC: 0 ng/mL (ref 0.00–0.08)

## 2015-04-11 LAB — I-STAT CHEM 8, ED
BUN: 10 mg/dL (ref 6–20)
CREATININE: 0.5 mg/dL (ref 0.44–1.00)
Calcium, Ion: 1.13 mmol/L (ref 1.12–1.23)
Chloride: 102 mmol/L (ref 101–111)
Glucose, Bld: 338 mg/dL — ABNORMAL HIGH (ref 65–99)
HEMATOCRIT: 49 % — AB (ref 36.0–46.0)
Hemoglobin: 16.7 g/dL — ABNORMAL HIGH (ref 12.0–15.0)
Potassium: 4 mmol/L (ref 3.5–5.1)
Sodium: 138 mmol/L (ref 135–145)
TCO2: 22 mmol/L (ref 0–100)

## 2015-04-11 LAB — TRIGLYCERIDES: Triglycerides: 193 mg/dL — ABNORMAL HIGH (ref <150)

## 2015-04-11 LAB — CBG MONITORING, ED: Glucose-Capillary: 313 mg/dL — ABNORMAL HIGH (ref 65–99)

## 2015-04-11 SURGERY — RADIOLOGY WITH ANESTHESIA
Anesthesia: Monitor Anesthesia Care

## 2015-04-11 MED ORDER — PANTOPRAZOLE SODIUM 40 MG IV SOLR
40.0000 mg | Freq: Every day | INTRAVENOUS | Status: DC
  Administered 2015-04-11 – 2015-04-12 (×2): 40 mg via INTRAVENOUS
  Filled 2015-04-11 (×2): qty 40

## 2015-04-11 MED ORDER — CHLORHEXIDINE GLUCONATE 0.12% ORAL RINSE (MEDLINE KIT)
15.0000 mL | Freq: Two times a day (BID) | OROMUCOSAL | Status: DC
  Administered 2015-04-11 – 2015-04-25 (×27): 15 mL via OROMUCOSAL

## 2015-04-11 MED ORDER — ACETAMINOPHEN 325 MG PO TABS
650.0000 mg | ORAL_TABLET | ORAL | Status: DC | PRN
  Administered 2015-04-12: 650 mg via ORAL
  Filled 2015-04-11: qty 2

## 2015-04-11 MED ORDER — GLYCOPYRROLATE 0.2 MG/ML IJ SOLN
INTRAMUSCULAR | Status: DC | PRN
  Administered 2015-04-11 (×2): 0.2 mg via INTRAVENOUS

## 2015-04-11 MED ORDER — PROPOFOL 10 MG/ML IV BOLUS
INTRAVENOUS | Status: DC | PRN
  Administered 2015-04-11: 70 mg via INTRAVENOUS

## 2015-04-11 MED ORDER — NICARDIPINE HCL IN NACL 20-0.86 MG/200ML-% IV SOLN
3.0000 mg/h | INTRAVENOUS | Status: DC
  Administered 2015-04-11: 15 mg/h via INTRAVENOUS
  Administered 2015-04-11: 3 mg/h via INTRAVENOUS
  Administered 2015-04-12: 15 mg/h via INTRAVENOUS
  Administered 2015-04-12: 12.5 mg/h via INTRAVENOUS
  Administered 2015-04-12: 10 mg/h via INTRAVENOUS
  Administered 2015-04-12: 8 mg/h via INTRAVENOUS
  Administered 2015-04-12: 10 mg/h via INTRAVENOUS
  Administered 2015-04-12: 15 mg/h via INTRAVENOUS
  Administered 2015-04-13: 6 mg/h via INTRAVENOUS
  Administered 2015-04-13: 4 mg/h via INTRAVENOUS
  Administered 2015-04-13 – 2015-04-15 (×4): 5 mg/h via INTRAVENOUS
  Administered 2015-04-16: 3 mg/h via INTRAVENOUS
  Administered 2015-04-16: 2 mg/h via INTRAVENOUS
  Administered 2015-04-16: 10 mg/h via INTRAVENOUS
  Administered 2015-04-17 (×2): 3 mg/h via INTRAVENOUS
  Administered 2015-04-17: 2 mg/h via INTRAVENOUS
  Administered 2015-04-19: 3 mg/h via INTRAVENOUS
  Filled 2015-04-11 (×22): qty 200

## 2015-04-11 MED ORDER — LABETALOL HCL 5 MG/ML IV SOLN
10.0000 mg | Freq: Once | INTRAVENOUS | Status: AC
  Administered 2015-04-11: 10 mg via INTRAVENOUS

## 2015-04-11 MED ORDER — ACETAMINOPHEN 650 MG RE SUPP
650.0000 mg | RECTAL | Status: DC | PRN
  Administered 2015-04-11 – 2015-04-12 (×2): 650 mg via RECTAL
  Filled 2015-04-11 (×2): qty 1

## 2015-04-11 MED ORDER — PROPOFOL 1000 MG/100ML IV EMUL
5.0000 ug/kg/min | INTRAVENOUS | Status: DC
  Administered 2015-04-11: 20 ug/kg/min via INTRAVENOUS
  Administered 2015-04-11: 5 ug/kg/min via INTRAVENOUS
  Administered 2015-04-12: 30 ug/kg/min via INTRAVENOUS
  Administered 2015-04-12: 40 ug/kg/min via INTRAVENOUS
  Administered 2015-04-12 – 2015-04-13 (×3): 30 ug/kg/min via INTRAVENOUS
  Filled 2015-04-11 (×7): qty 100

## 2015-04-11 MED ORDER — STROKE: EARLY STAGES OF RECOVERY BOOK
Freq: Once | Status: AC
  Administered 2015-04-11: 14:00:00
  Filled 2015-04-11: qty 1

## 2015-04-11 MED ORDER — INSULIN ASPART 100 UNIT/ML ~~LOC~~ SOLN
10.0000 [IU] | Freq: Once | SUBCUTANEOUS | Status: AC
  Administered 2015-04-11: 10 [IU] via SUBCUTANEOUS
  Filled 2015-04-11: qty 0.1

## 2015-04-11 MED ORDER — HYDRALAZINE HCL 20 MG/ML IJ SOLN
10.0000 mg | Freq: Once | INTRAMUSCULAR | Status: AC
  Administered 2015-04-11: 10 mg via INTRAVENOUS
  Filled 2015-04-11: qty 1

## 2015-04-11 MED ORDER — VECURONIUM BROMIDE 10 MG IV SOLR
INTRAVENOUS | Status: DC | PRN
  Administered 2015-04-11 (×2): 2 mg via INTRAVENOUS

## 2015-04-11 MED ORDER — PHENYLEPHRINE HCL 10 MG/ML IJ SOLN
0.0000 ug/min | INTRAVENOUS | Status: DC
  Filled 2015-04-11: qty 1

## 2015-04-11 MED ORDER — SODIUM CHLORIDE 0.9 % IV SOLN
INTRAVENOUS | Status: DC | PRN
  Administered 2015-04-11 (×2): via INTRAVENOUS

## 2015-04-11 MED ORDER — HYDRALAZINE HCL 20 MG/ML IJ SOLN
5.0000 mg | Freq: Four times a day (QID) | INTRAMUSCULAR | Status: DC | PRN
  Administered 2015-04-11 – 2015-04-12 (×2): 5 mg via INTRAVENOUS
  Administered 2015-04-13: 10 mg via INTRAVENOUS
  Filled 2015-04-11 (×3): qty 1

## 2015-04-11 MED ORDER — LIDOCAINE HCL 1 % IJ SOLN
INTRAMUSCULAR | Status: AC
  Filled 2015-04-11: qty 20

## 2015-04-11 MED ORDER — INSULIN ASPART 100 UNIT/ML ~~LOC~~ SOLN
0.0000 [IU] | SUBCUTANEOUS | Status: DC
  Administered 2015-04-11 (×2): 8 [IU] via SUBCUTANEOUS
  Administered 2015-04-12: 11 [IU] via SUBCUTANEOUS
  Administered 2015-04-12 (×2): 5 [IU] via SUBCUTANEOUS

## 2015-04-11 MED ORDER — ROCURONIUM BROMIDE 100 MG/10ML IV SOLN
INTRAVENOUS | Status: DC | PRN
  Administered 2015-04-11: 10 mg via INTRAVENOUS
  Administered 2015-04-11: 30 mg via INTRAVENOUS
  Administered 2015-04-11: 10 mg via INTRAVENOUS

## 2015-04-11 MED ORDER — ATROPINE SULFATE 0.4 MG/ML IJ SOLN
INTRAMUSCULAR | Status: DC | PRN
  Administered 2015-04-11: .1 mg via INTRAVENOUS

## 2015-04-11 MED ORDER — FENTANYL CITRATE (PF) 250 MCG/5ML IJ SOLN
INTRAMUSCULAR | Status: DC | PRN
  Administered 2015-04-11: 100 ug via INTRAVENOUS
  Administered 2015-04-11 (×2): 50 ug via INTRAVENOUS

## 2015-04-11 MED ORDER — SUCCINYLCHOLINE CHLORIDE 20 MG/ML IJ SOLN
INTRAMUSCULAR | Status: DC | PRN
  Administered 2015-04-11: 100 mg via INTRAVENOUS

## 2015-04-11 MED ORDER — PROPOFOL 500 MG/50ML IV EMUL
INTRAVENOUS | Status: DC | PRN
  Administered 2015-04-11: 30 ug/kg/min via INTRAVENOUS

## 2015-04-11 MED ORDER — LORAZEPAM 2 MG/ML IJ SOLN
1.0000 mg | Freq: Once | INTRAMUSCULAR | Status: AC
  Administered 2015-04-11: 1 mg via INTRAVENOUS
  Filled 2015-04-11: qty 1

## 2015-04-11 MED ORDER — LABETALOL HCL 5 MG/ML IV SOLN
INTRAVENOUS | Status: AC
  Filled 2015-04-11: qty 4

## 2015-04-11 MED ORDER — LABETALOL HCL 5 MG/ML IV SOLN: 10.0000 mg | INTRAVENOUS | Status: DC | PRN

## 2015-04-11 MED ORDER — SODIUM CHLORIDE 0.9 % IV SOLN
INTRAVENOUS | Status: DC
  Administered 2015-04-11 – 2015-04-15 (×4): via INTRAVENOUS

## 2015-04-11 MED ORDER — FENTANYL CITRATE (PF) 100 MCG/2ML IJ SOLN
100.0000 ug | INTRAMUSCULAR | Status: DC | PRN
  Filled 2015-04-11: qty 2

## 2015-04-11 MED ORDER — DEXTROSE 5 % IV SOLN
10.0000 mg | INTRAVENOUS | Status: DC | PRN
  Administered 2015-04-11: 50 ug/min via INTRAVENOUS

## 2015-04-11 MED ORDER — ANTISEPTIC ORAL RINSE SOLUTION (CORINZ)
7.0000 mL | Freq: Four times a day (QID) | OROMUCOSAL | Status: DC
  Administered 2015-04-11 – 2015-04-12 (×6): 7 mL via OROMUCOSAL

## 2015-04-11 MED ORDER — VERAPAMIL HCL 2.5 MG/ML IV SOLN
INTRAVENOUS | Status: AC
  Filled 2015-04-11: qty 2

## 2015-04-11 MED ORDER — CHLORHEXIDINE GLUCONATE 0.12% ORAL RINSE (MEDLINE KIT)
15.0000 mL | Freq: Once | OROMUCOSAL | Status: AC
  Administered 2015-04-11: 15 mL via OROMUCOSAL

## 2015-04-11 MED ORDER — IOHEXOL 300 MG/ML  SOLN
300.0000 mL | Freq: Once | INTRAMUSCULAR | Status: DC | PRN
  Administered 2015-04-11: 150 mL via INTRA_ARTERIAL
  Filled 2015-04-11: qty 300

## 2015-04-11 MED ORDER — ALTEPLASE (STROKE) FULL DOSE INFUSION
0.9000 mg/kg | Freq: Once | INTRAVENOUS | Status: AC
  Administered 2015-04-11: 79 mg via INTRAVENOUS
  Filled 2015-04-11: qty 79

## 2015-04-11 MED ORDER — NICARDIPINE HCL IN NACL 20-0.86 MG/200ML-% IV SOLN
INTRAVENOUS | Status: AC
  Filled 2015-04-11: qty 200

## 2015-04-11 MED ORDER — ALTEPLASE 30 MG/30 ML FOR INTERV. RAD
1.0000 mg | INTRA_ARTERIAL | Status: AC | PRN
  Filled 2015-04-11: qty 30

## 2015-04-11 NOTE — Progress Notes (Signed)
Patient's systolic blood pressure sustaining in the 150s-160s despite Cardene running at 15 mg/hr. Dr. Leonel Ramsay made aware. Systolic blood pressure goal now 100-150. New orders placed. Thayer Ohm D

## 2015-04-11 NOTE — BH Specialist Note (Signed)
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 Treated with embolectomy and stent placement.   TICI 2A flow.  There are multiple distal emboli.

## 2015-04-11 NOTE — Progress Notes (Signed)
MD notified of End Tidal CO2 reading being off by 30 from initial ABG CO2. D/C'd end tidal CO2 per MD order. RN aware.

## 2015-04-11 NOTE — Anesthesia Postprocedure Evaluation (Signed)
  Anesthesia Post-op Note  Patient: Kerry [name]  Procedure(s) Performed: Procedure(s): RADIOLOGY WITH ANESTHESIA (N/A)  Patient Location: NICU  Anesthesia Type:General  Level of Consciousness: sedated and Patient remains intubated per anesthesia plan  Airway and Oxygen Therapy: Patient remains intubated per anesthesia plan and Patient placed on Ventilator (see vital sign flow sheet for setting)  Post-op Pain: none  Post-op Assessment: Post-op Vital signs reviewed, Patient's Cardiovascular Status Stable, Respiratory Function Stable, Patent Airway and Pain level controlled LLE Motor Response: Purposeful movement   RLE Motor Response: Purposeful movement        Post-op Vital Signs: stable  Last Vitals:  Filed Vitals:   04/11/15 1630  BP: 140/61  Pulse: 62  Temp: 37.9 C  Resp: 23    Complications: No apparent anesthesia complications

## 2015-04-11 NOTE — Anesthesia Procedure Notes (Signed)
Procedure Name: Intubation Date/Time: 04/11/2015 11:03 AM Performed by: Julian Reil Pre-anesthesia Checklist: Patient identified, Emergency Drugs available, Suction available and Patient being monitored Patient Re-evaluated:Patient Re-evaluated prior to inductionOxygen Delivery Method: Circle system utilized Preoxygenation: Pre-oxygenation with 100% oxygen Intubation Type: IV induction, Rapid sequence and Cricoid Pressure applied Laryngoscope Size: Mac and 4 Grade View: Grade I Tube type: Subglottic suction tube Tube size: 7.5 mm Number of attempts: 1 Airway Equipment and Method: Stylet Placement Confirmation: ETT inserted through vocal cords under direct vision,  positive ETCO2 and breath sounds checked- equal and bilateral Secured at: 21 cm Tube secured with: Tape Dental Injury: Teeth and Oropharynx as per pre-operative assessment

## 2015-04-11 NOTE — ED Notes (Signed)
At CT for CT angio, pt became very agitated and grabbing her head. Pt appears to be less neurologically intact than she was when we arrived at CT. MD at the bedside. Second CT of the head preformed to check for bleeding due to TPA.

## 2015-04-11 NOTE — ED Notes (Signed)
Attempting to get blood pressure at this time. Last BP was elevated, pt will not hold arm still at this time and manual BP unable to be obtained, MD at bedside.

## 2015-04-11 NOTE — H&P (Signed)
Admission H&P    Chief Complaint: New onset speech difficulty and right hemiparesis.  HPI: Kerry [name] is an 59 y.o. female with a history of diabetes mellitus, hypertension and hyperlipidemia brought to the emergency room following acute onset of speech difficulty as well as right facial and extremity weakness. Patient has no previous history of stroke nor TIA. She has not been on antiplatelet therapy. CT scan of her head showed no signs of acute hemorrhage. Dense left MCA was noted, indicative of acute thrombus. Patient was deemed a candidate for TPA which was started. She had repeated problems with hypertension requiring intervention with IV labetalol as well as hydralazine. Complete dose of TPA was not administered. Patient was taken to interventional radiology for further management following a repeat CT scan of her head to rule out intracranial hemorrhage after worsening of right hemiparesis and development of lethargy. Repeat study showed no signs of acute intracranial hemorrhage. Study did show early signs of possible MCA territory stroke involving left temporal and frontoparietal regions as well as left basal ganglia area.  LSN: 6:30 AM on 04/11/2015 tPA Given: Yes mRankin:  Past Medical History  Diagnosis Date  . Hypertension   . Coronary artery disease   . Diabetes mellitus   . Breast cancer ([place]) 03/10/2007    Left breat  . Degenerative disc disease, cervical   . Degenerative disc disease, lumbar   . Fibromyalgia   . Hyperlipidemia   . Carpal tunnel syndrome on right     Past Surgical History  Procedure Laterality Date  . Cardiac surgery    . Appendectomy    . Breast surgery    . Mastectomy Left 03/2007  . Cardiac catheterization  2005  . Vein surgery Right 2005  . Tubal ligation Right     Family History  Problem Relation Age of Onset  . Cancer Mother     Breast cancer (right)  . Diabetes Sister   . Cancer Maternal Aunt     Breast cancer  . Cancer Maternal  Aunt     Breast cancer  . Cancer Cousin     Breast Cancer   Social History:  reports that she has been smoking.  She has never used smokeless tobacco. She reports that she drinks alcohol. She reports that she does not use illicit drugs.  Allergies:  Allergies  Allergen Reactions  . Oxycodone     Sick   . Oxycontin [Oxycodone Hcl]     Sick     Medications: Patient's preadmission medications were reviewed by me.  ROS: History obtained from patient's son.  General ROS: negative for - chills, fatigue, fever, night sweats, weight gain or weight loss Psychological ROS: negative for - behavioral disorder, hallucinations, memory difficulties, mood swings or suicidal ideation Ophthalmic ROS: negative for - blurry vision, double vision, eye pain or loss of vision ENT ROS: negative for - epistaxis, nasal discharge, oral lesions, sore throat, tinnitus or vertigo Allergy and Immunology ROS: negative for - hives or itchy/watery eyes Hematological and Lymphatic ROS: negative for - bleeding problems, bruising or swollen lymph nodes Endocrine ROS: Typically poor control of diabetes with blood sugars in the 300s to 400s Respiratory ROS: negative for - cough, hemoptysis, shortness of breath or wheezing Cardiovascular ROS: negative for - chest pain, dyspnea on exertion, edema or irregular heartbeat Gastrointestinal ROS: negative for - abdominal pain, diarrhea, hematemesis, nausea/vomiting or stool incontinence Genito-Urinary ROS: negative for - dysuria, hematuria, incontinence or urinary frequency/urgency Musculoskeletal ROS: negative for -  joint swelling or muscular weakness Neurological ROS: as noted in HPI Dermatological ROS: negative for rash and skin lesion changes  Physical Examination: Blood pressure 213/97, pulse 45, temperature 98.2 F (36.8 C), temperature source Oral, resp. rate 18, weight 87.5 kg (192 lb 14.4 oz), SpO2 97 %.  HEENT-  Normocephalic, no lesions, without obvious  abnormality.  Normal external eye and conjunctiva.  Normal TM's bilaterally.  Normal auditory canals and external ears. Normal external nose, mucus membranes and septum.  Normal pharynx. Neck supple with no masses, nodes, nodules or enlargement. Cardiovascular - regular rate and rhythm, S1, S2 normal, no murmur, click, rub or gallop Lungs - chest clear, no wheezing, rales, normal symmetric air entry Abdomen - soft, non-tender; bowel sounds normal; no masses,  no organomegaly Extremities - no joint deformities, effusion, or inflammation and no edema  Neurologic Examination: Mental Status: Lethargic with severe receptive and expressive aphasia. Cranial Nerves: Dense right homonymous hemianopsia. III/IV/VI-Pupils were equal and reacted normally to light. Conjugate gaze preference to the left side.    VII-slight right lower facial weakness. Threasa Beards dysarthric speech. XI: trapezius strength/neck flexion strength normal bilaterally XII-midline tongue extension with normal strength. Motor: Moderate drift of right upper and lower extremities with flaccid tone; normal strength and muscle tone of left extremities. Sensory: Moderate neglect of right side. Deep Tendon Reflexes: 1+ and symmetric. Plantars: Flexor on the left and extensor on the right Cerebellar: Not able to test due to severe receptive aphasia. Carotid auscultation: Normal  Results for orders placed or performed during the hospital encounter of 04/11/15 (from the past 48 hour(s))  Protime-INR     Status: None   Collection Time: 04/11/15  9:05 AM  Result Value Ref Range   Prothrombin Time 13.4 11.6 - 15.2 seconds   INR 1.00 0.00 - 1.49  APTT     Status: None   Collection Time: 04/11/15  9:05 AM  Result Value Ref Range   aPTT 24 24 - 37 seconds  CBC     Status: Abnormal   Collection Time: 04/11/15  9:05 AM  Result Value Ref Range   WBC 9.4 4.0 - 10.5 K/uL   RBC 5.19 (H) 3.87 - 5.11 MIL/uL   Hemoglobin 15.2  (H) 12.0 - 15.0 g/dL   HCT 44.8 36.0 - 46.0 %   MCV 86.3 78.0 - 100.0 fL   MCH 29.3 26.0 - 34.0 pg   MCHC 33.9 30.0 - 36.0 g/dL   RDW 13.1 11.5 - 15.5 %   Platelets 192 150 - 400 K/uL  Differential     Status: None   Collection Time: 04/11/15  9:05 AM  Result Value Ref Range   Neutrophils Relative % 69 %   Neutro Abs 6.4 1.7 - 7.7 K/uL   Lymphocytes Relative 24 %   Lymphs Abs 2.3 0.7 - 4.0 K/uL   Monocytes Relative 5 %   Monocytes Absolute 0.5 0.1 - 1.0 K/uL   Eosinophils Relative 2 %   Eosinophils Absolute 0.2 0.0 - 0.7 K/uL   Basophils Relative 0 %   Basophils Absolute 0.0 0.0 - 0.1 K/uL  Comprehensive metabolic panel     Status: Abnormal   Collection Time: 04/11/15  9:05 AM  Result Value Ref Range   Sodium 135 135 - 145 mmol/L   Potassium 4.1 3.5 - 5.1 mmol/L   Chloride 102 101 - 111 mmol/L   CO2 25 22 - 32 mmol/L   Glucose, Bld 325 (H) 65 - 99 mg/dL  BUN 9 6 - 20 mg/dL   Creatinine, Ser 0.62 0.44 - 1.00 mg/dL   Calcium 9.1 8.9 - 10.3 mg/dL   Total Protein 6.9 6.5 - 8.1 g/dL   Albumin 3.6 3.5 - 5.0 g/dL   AST 15 15 - 41 U/L   ALT 16 14 - 54 U/L   Alkaline Phosphatase 66 38 - 126 U/L   Total Bilirubin 0.4 0.3 - 1.2 mg/dL   GFR calc non Af Amer >60 >60 mL/min   GFR calc Af Amer >60 >60 mL/min    Comment: (NOTE) The eGFR has been calculated using the CKD EPI equation. This calculation has not been validated in all clinical situations. eGFR's persistently <60 mL/min signify possible Chronic Kidney Disease.    Anion gap 8 5 - 15  I-stat troponin, ED (not at [facility])     Status: None   Collection Time: 04/11/15  9:07 AM  Result Value Ref Range   Troponin i, poc 0.00 0.00 - 0.08 ng/mL   Comment 3            Comment: Due to the release kinetics of cTnI, a negative result within the first hours of the onset of symptoms does not rule out myocardial infarction with certainty. If myocardial infarction is still suspected, repeat the test at appropriate intervals.    I-Stat Chem 8, ED  (not at [facility])     Status: Abnormal   Collection Time: 04/11/15  9:09 AM  Result Value Ref Range   Sodium 138 135 - 145 mmol/L   Potassium 4.0 3.5 - 5.1 mmol/L   Chloride 102 101 - 111 mmol/L   BUN 10 6 - 20 mg/dL   Creatinine, Ser 0.50 0.44 - 1.00 mg/dL   Glucose, Bld 338 (H) 65 - 99 mg/dL   Calcium, Ion 1.13 1.12 - 1.23 mmol/L   TCO2 22 0 - 100 mmol/L   Hemoglobin 16.7 (H) 12.0 - 15.0 g/dL   HCT 49.0 (H) 36.0 - 46.0 %  CBG monitoring, ED     Status: Abnormal   Collection Time: 04/11/15  9:20 AM  Result Value Ref Range   Glucose-Capillary 313 (H) 65 - 99 mg/dL  Urinalysis, Routine w reflex microscopic (not at [facility])     Status: Abnormal   Collection Time: 04/11/15  9:42 AM  Result Value Ref Range   Color, Urine YELLOW YELLOW   APPearance HAZY (A) CLEAR   Specific Gravity, Urine 1.020 1.005 - 1.030   pH 6.0 5.0 - 8.0   Glucose, UA >1000 (A) NEGATIVE mg/dL   Hgb urine dipstick TRACE (A) NEGATIVE   Bilirubin Urine NEGATIVE NEGATIVE   Ketones, ur NEGATIVE NEGATIVE mg/dL   Protein, ur NEGATIVE NEGATIVE mg/dL   Urobilinogen, UA 0.2 0.0 - 1.0 mg/dL   Nitrite POSITIVE (A) NEGATIVE   Leukocytes, UA SMALL (A) NEGATIVE  Urine microscopic-add on     Status: Abnormal   Collection Time: 04/11/15  9:42 AM  Result Value Ref Range   Squamous Epithelial / LPF RARE RARE   WBC, UA 21-50 <3 WBC/hpf   RBC / HPF 3-6 <3 RBC/hpf   Bacteria, UA MANY (A) RARE   Ct Head Wo Contrast  04/11/2015  CLINICAL DATA:  Previous code stroke patient now with worsening mental status. EXAM: CT HEAD WITHOUT CONTRAST TECHNIQUE: Contiguous axial images were obtained from the base of the skull through the vertex without intravenous contrast. COMPARISON:  Earlier same day FINDINGS: The study suffers from motion degradation.  Mild hyperdensity in the left MCA remains visible. Early loss of gray-white differentiation is evident in the left temporal lobe and frontoparietal region consistent with  early changes of left middle cerebral artery territory infarction. There is low-density in the basal ganglia. Aspects score would be 5 or 6. No evidence of hemorrhage. No evidence of shift. No hydrocephalus. No extra-axial collection. IMPRESSION: Slightly more conspicuous loss of gray-white differentiation affecting the left temporal lobe and frontoparietal regions in the left basal ganglia consistent with acute left MCA infarction. Aspects score difficult because of some motion, but estimated at 5 or 6. Electronically Signed   By: Nelson Chimes M.D.   On: 04/11/2015 10:52   Ct Head Wo Contrast  04/11/2015  CLINICAL DATA:  Right-sided weakness and aphasia this morning. EXAM: CT HEAD WITHOUT CONTRAST TECHNIQUE: Contiguous axial images were obtained from the base of the skull through the vertex without intravenous contrast. COMPARISON:  04/20/2008 FINDINGS: There is a dense left MCA sign consistent with thrombus. There may be mild edema in the insular cortex region. The gray-white differentiation is maintained. No mass effect or hemorrhage. No extra-axial fluid collections. The brainstem and cerebellum are grossly normal. IMPRESSION: CT findings consistent with early left MCA infarct with a dense left MCA sign. No mass effect or hemorrhage. These results were called by telephone at the time of interpretation on 04/11/2015 at 9:29 am to Dr. Nicole Kindred , who verbally acknowledged these results. Electronically Signed   By: Marijo Sanes M.D.   On: 04/11/2015 09:29    Assessment: 59 y.o. female with multiple risk factors for stroke presenting with acute left MCA territory ischemic infarction with thrombus demonstrated on CT scan and subsequently on catheter angiogram.  Stroke Risk Factors - diabetes mellitus, hyperlipidemia, hypertension and smoking  Plan: 1. Post TPA/IR admission and management in neuro intensive care unit 2. MRI of the brain without contrast 3. PT consult, OT consult, Speech consult 4.  Echocardiogram 5. Prophylactic therapy-Antiplatelet med: Aspirin if CT scan 24 hours post TPA administration shows no acute intracranial hemorrhage. 6. Risk factor modification 7. Telemetry monitoring 8. HgbA1c, fasting lipid panel  This patient is critically ill and at significant risk of neurological worsening or death, and care requires constant monitoring of vital signs, hemodynamics,respiratory and cardiac monitoring, neurological assessment, discussion with family, other specialists and medical decision making of high complexity. Total critical care time was 120 minutes.  C.R. Nicole Kindred, MD [facility] [phone]  04/11/2015, 12:40 PM

## 2015-04-11 NOTE — ED Provider Notes (Signed)
CSN: [id number]     Arrival date & time 04/11/15  0901 History   First MD Initiated Contact with Patient 04/11/15 [phone]     Chief Complaint  Patient presents with  . Code Stroke     (Consider location/radiation/quality/duration/timing/severity/associated sxs/prior Treatment) HPI Comments: Patient presents to the ER for evaluation of code stroke. Patient had sudden onset of a aphasia at 6:30 to 6:45 AM this morning. She has brought to the ER by ambulance. Patient has expressive aphasia, possibly receptive aphasia. Having difficulty following commands. EMS report that she had weakness of the right extremity during transport. Level V Caveat due to condition.   Past Medical History  Diagnosis Date  . Hypertension   . Coronary artery disease   . Diabetes mellitus   . Breast cancer ([place]) 03/10/2007    Left breat  . Degenerative disc disease, cervical   . Degenerative disc disease, lumbar   . Fibromyalgia   . Hyperlipidemia   . Carpal tunnel syndrome on right    Past Surgical History  Procedure Laterality Date  . Cardiac surgery    . Appendectomy    . Breast surgery    . Mastectomy Left 03/2007  . Cardiac catheterization  2005  . Vein surgery Right 2005  . Tubal ligation Right    Family History  Problem Relation Age of Onset  . Cancer Mother     Breast cancer (right)  . Diabetes Sister   . Cancer Maternal Aunt     Breast cancer  . Cancer Maternal Aunt     Breast cancer  . Cancer Cousin     Breast Cancer   Social History  Substance Use Topics  . Smoking status: Current Every Day Smoker -- 0.25 packs/day  . Smokeless tobacco: Never Used  . Alcohol Use: Yes     Comment: Social drinker   OB History    No data available     Review of Systems  Unable to perform ROS: Patient nonverbal      Allergies  Oxycodone and Oxycontin  Home Medications   Prior to Admission medications   Medication Sig Start Date End Date Taking? Authorizing Provider  amLODipine  (NORVASC) 10 MG tablet Take 10 mg by mouth daily.    Historical Provider, MD  cloNIDine (CATAPRES) 0.1 MG tablet Take 0.1 mg by mouth 2 (two) times daily.    Historical Provider, MD  glipiZIDE (GLUCOTROL XL) 10 MG 24 hr tablet Take 10 mg by mouth daily.    Historical Provider, MD  insulin glargine (LANTUS) 100 UNIT/ML injection Inject 30 Units into the skin at bedtime.     Historical Provider, MD  LISINOPRIL PO Take 20 mg by mouth daily.     Historical Provider, MD  LORazepam (ATIVAN) 1 MG tablet Take 1 mg by mouth at bedtime.    Historical Provider, MD  metoprolol succinate (TOPROL-XL) 50 MG 24 hr tablet Take 50 mg by mouth daily. Take with or immediately following a meal.    Historical Provider, MD  oxyCODONE-acetaminophen (PERCOCET) 5-325 MG per tablet Take 1 tablet by mouth every 6 (six) hours as needed. 02/21/14   Carmin Muskrat, MD  PARoxetine (PAXIL) 20 MG tablet Take 20 mg by mouth every morning.    Historical Provider, MD  pravastatin (PRAVACHOL) 40 MG tablet Take 40 mg by mouth daily.    Historical Provider, MD  SUMAtriptan (IMITREX) 100 MG tablet Take 100 mg by mouth daily.    Historical Provider, MD   BP 179/82  mmHg  Pulse 65  Temp(Src) 98.7 F (37.1 C) (Oral)  Resp 14  Wt 192 lb 14.4 oz (87.5 kg)  SpO2 96% Physical Exam  Constitutional: She appears well-developed and well-nourished. No distress.  HENT:  Head: Normocephalic and atraumatic.  Right Ear: Hearing normal.  Left Ear: Hearing normal.  Nose: Nose normal.  Mouth/Throat: Oropharynx is clear and moist and mucous membranes are normal.  Eyes: Conjunctivae and EOM are normal. Pupils are equal, round, and reactive to light.  Neck: Normal range of motion. Neck supple.  Cardiovascular: Regular rhythm, S1 normal and S2 normal.  Exam reveals no gallop and no friction rub.   No murmur heard. Pulmonary/Chest: Effort normal and breath sounds normal. No respiratory distress. She exhibits no tenderness.  Abdominal: Soft. Normal  appearance and bowel sounds are normal. There is no hepatosplenomegaly. There is no tenderness. There is no rebound, no guarding, no tenderness at McBurney's point and negative Murphy's sign. No hernia.  Musculoskeletal: Normal range of motion.  Neurological: She is alert. She has normal strength. No cranial nerve deficit or sensory deficit. Coordination normal.  Patient non-verbal  Skin: Skin is warm, dry and intact. No rash noted. No cyanosis.  Nursing note and vitals reviewed.   ED Course  Procedures (including critical care time) Labs Review Labs Reviewed  CBC - Abnormal; Notable for the following:    RBC 5.19 (*)    Hemoglobin 15.2 (*)    All other components within normal limits  CBG MONITORING, ED - Abnormal; Notable for the following:    Glucose-Capillary 313 (*)    All other components within normal limits  I-STAT CHEM 8, ED - Abnormal; Notable for the following:    Glucose, Bld 338 (*)    Hemoglobin 16.7 (*)    HCT 49.0 (*)    All other components within normal limits  DIFFERENTIAL  PROTIME-INR  APTT  COMPREHENSIVE METABOLIC PANEL  I-STAT TROPOININ, ED    Imaging Review No results found. I have personally reviewed and evaluated these images and lab results as part of my medical decision-making.   EKG Interpretation None      MDM   Final diagnoses:  None   acute CVA  Patient presents to the emergency department for evaluation of sudden onset aphasia. Patient treated as a code stroke in conjunction with neurology, Dr. Nicole Kindred. Dr. Nicole Kindred has ordered TPA, will admit the patient.    Orpah Greek, MD 04/11/15 [phone]

## 2015-04-11 NOTE — Anesthesia Preprocedure Evaluation (Signed)
Anesthesia Evaluation  Patient identified by MRN, date of birth, ID band Patient unresponsive    Reviewed: Allergy & Precautions, NPO status , Patient's Chart, lab work & pertinent test results, Unable to perform ROS - Chart review only  Airway Mallampati: II       Dental  (+) Teeth Intact   Pulmonary Current Smoker,    breath sounds clear to auscultation       Cardiovascular hypertension,  Rhythm:Regular Rate:Tachycardia     Neuro/Psych    GI/Hepatic   Endo/Other  diabetes  Renal/GU      Musculoskeletal   Abdominal   Peds  Hematology   Anesthesia Other Findings   Reproductive/Obstetrics                             Anesthesia Physical Anesthesia Plan  ASA: IV and emergent  Anesthesia Plan: General   Post-op Pain Management:    Induction: Intravenous, Cricoid pressure planned and Rapid sequence  Airway Management Planned: Oral ETT  Additional Equipment:   Intra-op Plan:   Post-operative Plan: Post-operative intubation/ventilation  Informed Consent: I have reviewed the patients History and Physical, chart, labs and discussed the procedure including the risks, benefits and alternatives for the proposed anesthesia with the patient or authorized representative who has indicated his/her understanding and acceptance.     Plan Discussed with: CRNA and Anesthesiologist  Anesthesia Plan Comments:         Anesthesia Quick Evaluation

## 2015-04-11 NOTE — Brief Op Note (Signed)
04/11/2015  12:50 PM  PATIENT:  Kerry [name]  59 y.o. female  PRE-OPERATIVE DIAGNOSIS:  Code Stroke  POST-OPERATIVE DIAGNOSIS:  * No post-op diagnosis entered *  PROCEDURE:  Procedure(s): RADIOLOGY WITH ANESTHESIA (N/A)  SURGEON:  Surgeon(s) and Role:    * Luanne Bras, MD - Primary  PHYSICIAN ASSISTANT:   ASSISTANTS: none   ANESTHESIA:   general  EBL:  Total I/O In: -  Out: 375 [Urine:375]  BLOOD ADMINISTERED:none  DRAINS: none   LOCAL MEDICATIONS USED:  OTHER verapamil 2.5 mg  SPECIMEN:  No Specimen  DISPOSITION OF SPECIMEN:  N/A  COUNTS:  YES  TOURNIQUET:  * No tourniquets in log *  DICTATION: .Dragon Dictation  PLAN OF CARE: Admit to inpatient   PATIENT DISPOSITION:  ICU - intubated and hemodynamically stable.   Delay start of Pharmacological VTE agent (>24hrs) due to surgical blood loss or risk of bleeding: yes

## 2015-04-11 NOTE — Code Documentation (Signed)
59yo female arriving to [facility] via [facility] at [phone].  EMS reports that the patient woke up this morning at her baseline and about 20 minutes later at 0645 developed aphasia, right sided weakness, right facial droop, blurred vision and HA.  BP elevated at 192/137 en route per EMS.  Patient with h/o left mastectomy, HTN and DM.  Stroke team at the bedside on arrival.  Labs drawn and patient cleared for CT by EDP.  Patient to CT with stroke team and ED RNs.  CT completed.  PIV x 2 placed.  NIHSS 16, see documentation for details and code stroke times. Patient with right facial droop, right arm and leg drift, global aphasia and partial left gaze palsy.  Patient does not blink to threat on the right during exam.  Patient mumbles but speech is incomprehensible.  Patient's boyfriend to the bedside who reports he was home with her when she asked him to go make coffee, and when he returned to her she thought she was having a stroke.  LKW 0630.  Dr. Nicole Kindred at the bedside discussing treatment plan with family.  Order to treat with tPA, and pharmacy at the bedside and made aware to mix tPA.  Initial BP 179/82 in the ED.  Foley catheter placed per MD order.  BP rechecked prior to tPA administration - 169/85.  8mg  tPA bolus given at 0932 followed by 71mg /hr for a total dose of 79mg  per pharmacy.  Patient monitored frequently per post-tPA protocol.  Patient with oozing from right AC PIV, continued to monitor.  Patient's BP increased to 203/82 at 0935, Dr. Nicole Kindred paged and to the bedside, tPA paused at 0938 per MD, Labetalol 10mg  IVP ordered and given at 0939.  BP 184/82 at 0940, Dr. Nicole Kindred at the bedside and tPA resumed per order.  BP rechecked at 0945 - 159/83.  BP 180/90 at 0951 and additional dose of Labetalol given per MD.  Patient transported to CT with ED RN and Stroke Team for CTA head and neck per MD.  While in CT for CTA BP cuff was relocated to patient's left leg d/t PIV in [facility] and contrast being  administered and unable to put on left arm d/t mastectomy.  BP increased to 192/75, rechecked and remained at 198/82.  CTA held and team worked to get accurate BP by placing cuff back on right arm.  Patient becoming restless and unable to lay still.  Patient grabbing her head and moaning.  Dr. Nicole Kindred at the bedside, tPA stopped at 1025 and orders for Ativan IVP and Hydralazine IVP.  Patient with worsening right arm weakness, MD aware.  BP continuing to be elevated and medications given.  Dr. Arizona Constable arrived and requested to d/c CTA.  CT head ordered and completed to r/o ICH.  Patient transported to IR with ED RN, Stroke Team and IR team at 1040.  BP remained elevated and tPA not restarted, MD aware. Handoff to IR team and anesthesia at that time.

## 2015-04-11 NOTE — ED Notes (Signed)
TPA hung by Langley Gauss, RN and Janett Billow, RN with this RN at bedside as well.

## 2015-04-11 NOTE — ED Notes (Signed)
Pt transported to CT ?

## 2015-04-11 NOTE — Transfer of Care (Signed)
Immediate Anesthesia Transfer of Care Note  Patient: Kerry [name]  Procedure(s) Performed: Procedure(s): RADIOLOGY WITH ANESTHESIA (N/A)  Patient Location: ICU  Anesthesia Type:General  Level of Consciousness: sedated, unresponsive and Patient remains intubated per anesthesia plan  Airway & Oxygen Therapy: Patient remains intubated per anesthesia plan and Patient placed on Ventilator (see vital sign flow sheet for setting)  Post-op Assessment: Report given to RN and Post -op Vital signs reviewed and stable  Post vital signs: Reviewed and stable  Last Vitals:  Filed Vitals:   04/11/15 1337  BP:   Pulse: 43  Temp:   Resp: 14    Complications: No apparent anesthesia complications

## 2015-04-11 NOTE — Consult Note (Signed)
PULMONARY / CRITICAL CARE MEDICINE   Name: Kerry [name] MRN: [id number] DOB: 11-15-1955    ADMISSION DATE:  04/11/2015 CONSULTATION DATE:  10/19  REFERRING MD :  [name]   CHIEF COMPLAINT:  ICU care and ventilator management   INITIAL PRESENTATION:   59 year old female admitted on 10/19 w/ acute left MCA territory CVA. Admitted to the ICU on full ventilator support s/p partial dose systemic TPA; and arterial embolectomy w/ ICA stent placement. Post intervention CT chest noted Possible left temporal ICH. PCCM asked to assist w/ care.   STUDIES:  10/19 CT head: Acute hemorrhage in the left temporal lobe. This may be within the sylvian fissure rather than the insular cortex. There are small gas bubbles in the area of hemorrhage which may be extra vascular gas in the subarachnoid space. This is likely related to the procedure and micro perforation of a vessel. Acute infarct left temporal lobe and possibly left basal ganglia. There is local edema without midline shift  SIGNIFICANT EVENTS: 10/19: admitted to ICU s/p IR intervention and systemic TPA    HISTORY OF PRESENT ILLNESS:  See above   PAST MEDICAL HISTORY :   has a past medical history of Hypertension; Coronary artery disease; Diabetes mellitus; Breast cancer ([place]) (03/10/2007); Degenerative disc disease, cervical; Degenerative disc disease, lumbar; Fibromyalgia; Hyperlipidemia; and Carpal tunnel syndrome on right.  has past surgical history that includes Cardiac surgery; Appendectomy; Breast surgery; Mastectomy (Left, 03/2007); Cardiac catheterization (2005); Vein Surgery (Right, 2005); and Tubal ligation (Right). Prior to Admission medications   Medication Sig Start Date End Date Taking? Authorizing Provider  amLODipine (NORVASC) 10 MG tablet Take 10 mg by mouth daily.    Historical Provider, MD  cloNIDine (CATAPRES) 0.1 MG tablet Take 0.1 mg by mouth 2 (two) times daily.    Historical Provider, MD  glipiZIDE (GLUCOTROL XL)  10 MG 24 hr tablet Take 10 mg by mouth daily.    Historical Provider, MD  insulin glargine (LANTUS) 100 UNIT/ML injection Inject 30 Units into the skin at bedtime.     Historical Provider, MD  LISINOPRIL PO Take 20 mg by mouth daily.     Historical Provider, MD  LORazepam (ATIVAN) 1 MG tablet Take 1 mg by mouth at bedtime.    Historical Provider, MD  metoprolol succinate (TOPROL-XL) 50 MG 24 hr tablet Take 50 mg by mouth daily. Take with or immediately following a meal.    Historical Provider, MD  oxyCODONE-acetaminophen (PERCOCET) 5-325 MG per tablet Take 1 tablet by mouth every 6 (six) hours as needed. 02/21/14   Carmin Muskrat, MD  PARoxetine (PAXIL) 20 MG tablet Take 20 mg by mouth every morning.    Historical Provider, MD  pravastatin (PRAVACHOL) 40 MG tablet Take 40 mg by mouth daily.    Historical Provider, MD  SUMAtriptan (IMITREX) 100 MG tablet Take 100 mg by mouth daily.    Historical Provider, MD   Allergies  Allergen Reactions  . Oxycodone     Sick   . Oxycontin [Oxycodone Hcl]     Sick     FAMILY HISTORY:  indicated that her mother is alive. She indicated that her father is alive. She indicated that only one of her two maternal aunts is alive. She indicated that her cousin is alive.  SOCIAL HISTORY:  reports that she has been smoking.  She has never used smokeless tobacco. She reports that she drinks alcohol. She reports that she does not use illicit drugs.  REVIEW OF  SYSTEMS:  Reviewed   SUBJECTIVE: sedated on vent   VITAL SIGNS: Temp:  [97.2 F (36.2 C)-98.7 F (37.1 C)] 98.2 F (36.8 C) (10/19 1000) Pulse Rate:  [43-78] 43 (10/19 1337) Resp:  [14-27] 14 (10/19 1337) BP: (131-213)/(75-101) 213/97 mmHg (10/19 1035) SpO2:  [94 %-100 %] 100 % (10/19 1337) FiO2 (%):  [100 %] 100 % (10/19 1337) Weight:  [87.5 kg (192 lb 14.4 oz)-87.8 kg (193 lb 9 oz)] 87.5 kg (192 lb 14.4 oz) (10/19 0919) HEMODYNAMICS:   VENTILATOR SETTINGS: Vent Mode:  [-] PRVC FiO2 (%):  [100 %]  100 % Set Rate:  [12 bmp] 12 bmp Vt Set:  [500 mL] 500 mL PEEP:  [5 cmH20] 5 cmH20 Plateau Pressure:  [15 cmH20] 15 cmH20 INTAKE / OUTPUT:  Intake/Output Summary (Last 24 hours) at 04/11/15 1354 Last data filed at 04/11/15 1304  Gross per 24 hour  Intake    800 ml  Output   1250 ml  Net   -450 ml    PHYSICAL EXAMINATION: General:  59 year old female, sedated on vent  Neuro:  Opens eyes, localizes on the left, decreased st on the right  HEENT:  NCAT, orally intubated Cardiovascular:  Rrr; no MRG Lungs:  Clear no accessory muscle use  Abdomen:  Soft, non-tender, + bowel sounds no OM Musculoskeletal:  Good st on left, equal bulk Skin:  Intact, brisk CR   LABS:  CBC  Recent Labs Lab 04/11/15 0905 04/11/15 0909  WBC 9.4  --   HGB 15.2* 16.7*  HCT 44.8 49.0*  PLT 192  --    Coag's  Recent Labs Lab 04/11/15 0905  APTT 24  INR 1.00   BMET  Recent Labs Lab 04/11/15 0905 04/11/15 0909  NA 135 138  K 4.1 4.0  CL 102 102  CO2 25  --   BUN 9 10  CREATININE 0.62 0.50  GLUCOSE 325* 338*   Electrolytes  Recent Labs Lab 04/11/15 0905  CALCIUM 9.1   Sepsis Markers No results for input(s): LATICACIDVEN, PROCALCITON, O2SATVEN in the last 168 hours. ABG No results for input(s): PHART, PCO2ART, PO2ART in the last 168 hours. Liver Enzymes  Recent Labs Lab 04/11/15 0905  AST 15  ALT 16  ALKPHOS 66  BILITOT 0.4  ALBUMIN 3.6   Cardiac Enzymes No results for input(s): TROPONINI, PROBNP in the last 168 hours. Glucose  Recent Labs Lab 04/11/15 0920  GLUCAP 313*    Imaging Ct Head Wo Contrast  04/11/2015  CLINICAL DATA:  Previous code stroke patient now with worsening mental status. EXAM: CT HEAD WITHOUT CONTRAST TECHNIQUE: Contiguous axial images were obtained from the base of the skull through the vertex without intravenous contrast. COMPARISON:  Earlier same day FINDINGS: The study suffers from motion degradation. Mild hyperdensity in the left MCA  remains visible. Early loss of gray-white differentiation is evident in the left temporal lobe and frontoparietal region consistent with early changes of left middle cerebral artery territory infarction. There is low-density in the basal ganglia. Aspects score would be 5 or 6. No evidence of hemorrhage. No evidence of shift. No hydrocephalus. No extra-axial collection. IMPRESSION: Slightly more conspicuous loss of gray-white differentiation affecting the left temporal lobe and frontoparietal regions in the left basal ganglia consistent with acute left MCA infarction. Aspects score difficult because of some motion, but estimated at 5 or 6. Electronically Signed   By: Nelson Chimes M.D.   On: 04/11/2015 10:52   Ct Head Wo Contrast  04/11/2015  CLINICAL DATA:  Right-sided weakness and aphasia this morning. EXAM: CT HEAD WITHOUT CONTRAST TECHNIQUE: Contiguous axial images were obtained from the base of the skull through the vertex without intravenous contrast. COMPARISON:  04/20/2008 FINDINGS: There is a dense left MCA sign consistent with thrombus. There may be mild edema in the insular cortex region. The gray-white differentiation is maintained. No mass effect or hemorrhage. No extra-axial fluid collections. The brainstem and cerebellum are grossly normal. IMPRESSION: CT findings consistent with early left MCA infarct with a dense left MCA sign. No mass effect or hemorrhage. These results were called by telephone at the time of interpretation on 04/11/2015 at 9:29 am to Dr. Nicole Kindred , who verbally acknowledged these results. Electronically Signed   By: Marijo Sanes M.D.   On: 04/11/2015 09:29     ASSESSMENT / PLAN:  PULMONARY OETT 10/19>>> A: Ventilator dependent s/p CVA  P:   Full vent support for now F/u am CT scan; then will assess for readiness to wean in the am  CARDIOVASCULAR CVL A:  HTN P:  SBP goal <140 Tele   RENAL A:   No acute but did get Dye load so at risk for AKI  P:   Gentle  hydration Strict I&O F/u chemistry   GASTROINTESTINAL A:   NPO status At risk for dysphagia  P:   PPI  Consider tubefeeds   HEMATOLOGIC A:   Bleeding risk s/p systemic TPA P:  Limit labs X 24hrs Watch for s/sx bleeding   INFECTIOUS A:   No acute  P:   Trend fever and WBC curve   ENDOCRINE A:   Hyperglycemia  P:   ssi protocol   NEUROLOGIC A:   Acute left MCA CVA; now s/p partial dose systemic TPA; and arterial embolectomy w/ ICA stent placement  Possible left temporal ICH  P:   RASS goal: -2 PAD protocol  BP goal < 140 F/u cva  FAMILY  - Updates:   - Inter-disciplinary family meet or Palliative Care meeting due by: 10/26    TODAY'S SUMMARY:  59 year old female admitted on 10/19 w/ acute left MCA territory CVA. Admitted to the ICU on full ventilator support s/p partial dose systemic TPA; and arterial embolectomy w/ ICA stent placement. Post intervention CT chest noted Possible left temporal ICH. Will support over night. Goal BP 100-140. Get CT head in am. If all stable assess for weaning in am.    04/11/2015, 1:54 PM  Attending Note  59 year old female with PMH above presenting after an acute ischemic stroke s/p procedure for retrieval and tPA given.  Intubated for airway protection.  Clear lungs to auscultation.  I reviewed CXR myself, clear.  Will maintain on full vent support for now and reCT and evaluate for SBT in AM.  The patient is critically ill with multiple organ systems failure and requires high complexity decision making for assessment and support, frequent evaluation and titration of therapies, application of advanced monitoring technologies and extensive interpretation of multiple databases.   Critical Care Time devoted to patient care services described in this note is  35  Minutes. This time reflects time of care of this signee Dr Jennet Maduro. This critical care time does not reflect procedure time, or teaching time or supervisory time of  PA/NP/Med student/Med Resident etc but could involve care discussion time.  Rush Farmer, M.D. [facility] Pulmonary/Critical Care Medicine. Pager: [phone]. After hours pager: [phone].

## 2015-04-11 NOTE — Progress Notes (Signed)
Patient's blood pressure not responding to Cardene. Currently running at 10 mg/hr. Last BP 172/52. Labetalol ordered as PRN but patient has been having episodes of bradycardia. Dr. Leonel Ramsay paged. Will increase Cardene and continue to monitor. Thayer Ohm D

## 2015-04-11 NOTE — ED Notes (Signed)
After removing pt from CT scanner, HR is noted to be 46 which is significantly lower than previous heart rates. Pt appears agitated, not responding to questions. MD and interventionalist aware and pt transported to IR immediately. During transport, HR dropped as low as 29, pt more lethargic.

## 2015-04-11 NOTE — ED Notes (Signed)
BP obtained with restricted extremity with verbal permission from MD due to pt agitation and non-compliance with holding the non-restricted extremity still.

## 2015-04-11 NOTE — ED Notes (Signed)
[facility]- pt here with c/o right side weakness, aphasia, with sudden onset at 84. Pt unable to talk, answer questions, or follow commands on arrival.

## 2015-04-12 ENCOUNTER — Inpatient Hospital Stay (HOSPITAL_COMMUNITY): Payer: 59

## 2015-04-12 ENCOUNTER — Encounter (HOSPITAL_COMMUNITY): Payer: Self-pay | Admitting: Interventional Radiology

## 2015-04-12 DIAGNOSIS — I611 Nontraumatic intracerebral hemorrhage in hemisphere, cortical: Secondary | ICD-10-CM

## 2015-04-12 DIAGNOSIS — Z87898 Personal history of other specified conditions: Secondary | ICD-10-CM

## 2015-04-12 DIAGNOSIS — I6789 Other cerebrovascular disease: Secondary | ICD-10-CM

## 2015-04-12 DIAGNOSIS — I6300 Cerebral infarction due to thrombosis of unspecified precerebral artery: Secondary | ICD-10-CM | POA: Insufficient documentation

## 2015-04-12 DIAGNOSIS — I63512 Cerebral infarction due to unspecified occlusion or stenosis of left middle cerebral artery: Secondary | ICD-10-CM | POA: Insufficient documentation

## 2015-04-12 DIAGNOSIS — Z452 Encounter for adjustment and management of vascular access device: Secondary | ICD-10-CM | POA: Insufficient documentation

## 2015-04-12 DIAGNOSIS — Z4659 Encounter for fitting and adjustment of other gastrointestinal appliance and device: Secondary | ICD-10-CM | POA: Insufficient documentation

## 2015-04-12 DIAGNOSIS — G935 Compression of brain: Secondary | ICD-10-CM

## 2015-04-12 LAB — SODIUM
SODIUM: 140 mmol/L (ref 135–145)
SODIUM: 144 mmol/L (ref 135–145)
SODIUM: 144 mmol/L (ref 135–145)

## 2015-04-12 LAB — GLUCOSE, CAPILLARY
GLUCOSE-CAPILLARY: 195 mg/dL — AB (ref 65–99)
GLUCOSE-CAPILLARY: 245 mg/dL — AB (ref 65–99)
GLUCOSE-CAPILLARY: 255 mg/dL — AB (ref 65–99)
GLUCOSE-CAPILLARY: 313 mg/dL — AB (ref 65–99)
Glucose-Capillary: 222 mg/dL — ABNORMAL HIGH (ref 65–99)
Glucose-Capillary: 240 mg/dL — ABNORMAL HIGH (ref 65–99)
Glucose-Capillary: 217 mg/dL — ABNORMAL HIGH (ref 65–99)

## 2015-04-12 LAB — LIPID PANEL
CHOL/HDL RATIO: 7.2 ratio
Cholesterol: 208 mg/dL — ABNORMAL HIGH (ref 0–200)
HDL: 29 mg/dL — AB (ref >40)
LDL CALC: 133 mg/dL — AB (ref 0–99)
Triglycerides: 231 mg/dL — ABNORMAL HIGH (ref <150)
VLDL: 46 mg/dL — ABNORMAL HIGH (ref 0–40)

## 2015-04-12 MED ORDER — SODIUM CHLORIDE 3 % IV SOLN
INTRAVENOUS | Status: DC
  Administered 2015-04-12 (×2): 75 mL/h via INTRAVENOUS
  Administered 2015-04-13: 60 mL/h via INTRAVENOUS
  Administered 2015-04-13: 75 mL/h via INTRAVENOUS
  Filled 2015-04-12 (×24): qty 500

## 2015-04-12 MED ORDER — VITAL HIGH PROTEIN PO LIQD
1000.0000 mL | ORAL | Status: DC
  Filled 2015-04-12 (×2): qty 1000

## 2015-04-12 MED ORDER — INSULIN ASPART 100 UNIT/ML ~~LOC~~ SOLN
0.0000 [IU] | SUBCUTANEOUS | Status: DC
  Administered 2015-04-12 (×2): 7 [IU] via SUBCUTANEOUS
  Administered 2015-04-12: 4 [IU] via SUBCUTANEOUS
  Administered 2015-04-12: 11 [IU] via SUBCUTANEOUS
  Administered 2015-04-13: 4 [IU] via SUBCUTANEOUS
  Administered 2015-04-13 (×2): 7 [IU] via SUBCUTANEOUS
  Administered 2015-04-13: 11 [IU] via SUBCUTANEOUS
  Administered 2015-04-13: 7 [IU] via SUBCUTANEOUS
  Administered 2015-04-14: 11 [IU] via SUBCUTANEOUS
  Administered 2015-04-14: 7 [IU] via SUBCUTANEOUS
  Administered 2015-04-14: 11 [IU] via SUBCUTANEOUS
  Administered 2015-04-14: 4 [IU] via SUBCUTANEOUS
  Administered 2015-04-14 (×2): 3 [IU] via SUBCUTANEOUS
  Administered 2015-04-14: 4 [IU] via SUBCUTANEOUS
  Administered 2015-04-15: 7 [IU] via SUBCUTANEOUS
  Administered 2015-04-15: 4 [IU] via SUBCUTANEOUS
  Administered 2015-04-15: 11 [IU] via SUBCUTANEOUS
  Administered 2015-04-15: 7 [IU] via SUBCUTANEOUS
  Administered 2015-04-15 (×2): 4 [IU] via SUBCUTANEOUS
  Administered 2015-04-16: 7 [IU] via SUBCUTANEOUS
  Administered 2015-04-16: 4 [IU] via SUBCUTANEOUS
  Administered 2015-04-16: 7 [IU] via SUBCUTANEOUS
  Administered 2015-04-16 (×2): 4 [IU] via SUBCUTANEOUS
  Administered 2015-04-17: 7 [IU] via SUBCUTANEOUS
  Administered 2015-04-17: 4 [IU] via SUBCUTANEOUS
  Administered 2015-04-17: 11 [IU] via SUBCUTANEOUS
  Administered 2015-04-17 (×2): 3 [IU] via SUBCUTANEOUS
  Administered 2015-04-18: 4 [IU] via SUBCUTANEOUS
  Administered 2015-04-18 (×4): 3 [IU] via SUBCUTANEOUS
  Administered 2015-04-18: 7 [IU] via SUBCUTANEOUS
  Administered 2015-04-19: 3 [IU] via SUBCUTANEOUS
  Administered 2015-04-19 (×3): 4 [IU] via SUBCUTANEOUS
  Administered 2015-04-19: 3 [IU] via SUBCUTANEOUS
  Administered 2015-04-19: 11 [IU] via SUBCUTANEOUS
  Administered 2015-04-20 (×3): 7 [IU] via SUBCUTANEOUS
  Administered 2015-04-20: 4 [IU] via SUBCUTANEOUS
  Administered 2015-04-20 – 2015-04-21 (×2): 7 [IU] via SUBCUTANEOUS
  Administered 2015-04-21: 4 [IU] via SUBCUTANEOUS
  Administered 2015-04-21 (×2): 7 [IU] via SUBCUTANEOUS
  Administered 2015-04-21: 4 [IU] via SUBCUTANEOUS
  Administered 2015-04-21: 7 [IU] via SUBCUTANEOUS
  Administered 2015-04-22: 4 [IU] via SUBCUTANEOUS
  Administered 2015-04-22: 7 [IU] via SUBCUTANEOUS
  Administered 2015-04-22: 4 [IU] via SUBCUTANEOUS
  Administered 2015-04-22: 7 [IU] via SUBCUTANEOUS
  Administered 2015-04-22: 4 [IU] via SUBCUTANEOUS
  Administered 2015-04-22 – 2015-04-23 (×7): 7 [IU] via SUBCUTANEOUS
  Administered 2015-04-24 (×4): 4 [IU] via SUBCUTANEOUS
  Administered 2015-04-24: 7 [IU] via SUBCUTANEOUS
  Administered 2015-04-24 (×2): 4 [IU] via SUBCUTANEOUS
  Administered 2015-04-25 (×4): 3 [IU] via SUBCUTANEOUS
  Administered 2015-04-26 (×2): 4 [IU] via SUBCUTANEOUS
  Administered 2015-04-26: 3 [IU] via SUBCUTANEOUS
  Administered 2015-04-26: 4 [IU] via SUBCUTANEOUS
  Administered 2015-04-26: 0 [IU] via SUBCUTANEOUS
  Administered 2015-04-26 – 2015-04-27 (×6): 3 [IU] via SUBCUTANEOUS

## 2015-04-12 MED ORDER — VITAL HIGH PROTEIN PO LIQD
1000.0000 mL | ORAL | Status: DC
  Administered 2015-04-12: 1000 mL
  Filled 2015-04-12 (×3): qty 1000

## 2015-04-12 MED ORDER — ASPIRIN 300 MG RE SUPP
300.0000 mg | Freq: Every day | RECTAL | Status: DC
  Administered 2015-04-12: 300 mg via RECTAL
  Filled 2015-04-12: qty 1

## 2015-04-12 MED ORDER — PRO-STAT SUGAR FREE PO LIQD
60.0000 mL | Freq: Three times a day (TID) | ORAL | Status: DC
  Administered 2015-04-12 – 2015-04-13 (×3): 60 mL
  Filled 2015-04-12 (×3): qty 60

## 2015-04-12 MED ORDER — MANNITOL 25 % IV SOLN: 25.0000 g | INTRAVENOUS | Status: AC

## 2015-04-12 MED ORDER — MANNITOL 25 % IV SOLN
INTRAVENOUS | Status: AC
  Administered 2015-04-12: 0.25 g
  Filled 2015-04-12: qty 50

## 2015-04-12 MED ORDER — CLONIDINE ORAL SUSPENSION 10 MCG/ML
0.1000 mg | Freq: Two times a day (BID) | ORAL | Status: DC
  Administered 2015-04-12 – 2015-04-16 (×9): 0.1 mg via ORAL
  Filled 2015-04-12 (×12): qty 10

## 2015-04-12 NOTE — Progress Notes (Signed)
Initial Nutrition Assessment  DOCUMENTATION CODES:   Obesity unspecified  INTERVENTION:   Initiate Vital High Protein @ 15 ml/hr via OG tube.   60 ml Prostat TID.    Tube feeding regimen provides 960 kcal, 121 grams of protein, and 300 ml of H2O.   TF regimen and propofol at current rate providing 1363 total kcal/day (114 % of kcal needs)  NUTRITION DIAGNOSIS:   Inadequate oral intake related to inability to eat as evidenced by NPO status.  GOAL:   Provide needs based on ASPEN/SCCM guidelines  MONITOR:   I & O's, Vent status, Labs, TF tolerance, Weight trends  REASON FOR ASSESSMENT:   Consult Enteral/tube feeding initiation and management  ASSESSMENT:   Pt admitted on 10/19 w/ acute left MCA territory CVA. Admitted to the ICU on full ventilator support s/p partial dose systemic TPA; and arterial embolectomy w/ ICA stent placement. Post intervention CT chest noted Possible left temporal ICH.   Patient is currently intubated on ventilator support MV: 12.6 L/min Temp (24hrs), Avg:100.5 F (38.1 C), Min:97.5 F (36.4 C), Max:102.2 F (39 C)  Propofol: 15.3 ml/hr provides: 403 kcal per day from lipid  Tube tip in stomach.  Labs reviewed: TG: 231, CBG's: 222-313 Medications reviewed and include: 3% IV Nutrition-Focused physical exam completed. Findings are no fat depletion, no muscle depletion, and no edema.    Diet Order:  Diet NPO time specified  Skin:  Reviewed, no issues  Last BM:  unknown  Height:   Ht Readings from Last 1 Encounters:  04/11/15 5\' 6"  (1.676 m)   Weight:   Wt Readings from Last 1 Encounters:  04/11/15 187 lb 9.8 oz (85.1 kg)   Ideal Body Weight:  59 kg  BMI:  Body mass index is 30.3 kg/(m^2).  Estimated Nutritional Needs:   Kcal:  [phone]  Protein:  118 grams  Fluid:  > 1.5 L/day  EDUCATION NEEDS:   No education needs identified at this time  [place], [place], [place] Pager [phone] After Hours Pager

## 2015-04-12 NOTE — Consult Note (Addendum)
PULMONARY / CRITICAL CARE MEDICINE   Name: Kerry [name] MRN: [id number] DOB: [birth year]    ADMISSION DATE:  04/11/2015 CONSULTATION DATE:  10/19  REFERRING MD :  [name]   CHIEF COMPLAINT:  ICU care and ventilator management   INITIAL PRESENTATION:   59 year old female admitted on 10/19 w/ acute left MCA territory CVA. Admitted to the ICU on full ventilator support s/p partial dose systemic TPA; and arterial embolectomy w/ ICA stent placement. Post intervention CT chest noted Possible left temporal ICH. PCCM asked to assist w/ care.   STUDIES:  10/19 CT head: Acute hemorrhage in the left temporal lobe. This may be within the sylvian fissure rather than the insular cortex. There are small gas bubbles in the area of hemorrhage which may be extra vascular gas in the subarachnoid space. This is likely related to the procedure and micro perforation of a vessel. Acute infarct left temporal lobe and possibly left basal ganglia. There is local edema without midline shift 10/20 ct head>>>Acute large LEFT MCA territory infarct with similar patchy areas of hemorrhage. Local mass effect without midline shift. Mildly dense intracranial vessels most compatible with residual contrast.  SIGNIFICANT EVENTS: 10/19: admitted to ICU s/p IR intervention and systemic TPA  10/20: remains vented  SUBJECTIVE: moving left side  VITAL SIGNS: Temp:  [97.2 F (36.2 C)-102.2 F (39 C)] 99.9 F (37.7 C) (10/20 0800) Pulse Rate:  [43-128] 83 (10/20 0800) Resp:  [14-27] 19 (10/20 0800) BP: (104-213)/(49-101) 144/56 mmHg (10/20 0800) SpO2:  [94 %-100 %] 100 % (10/20 0800) FiO2 (%):  [40 %-100 %] 40 % (10/20 0800) Weight:  [85.1 kg (187 lb 9.8 oz)-87.8 kg (193 lb 9 oz)] 85.1 kg (187 lb 9.8 oz) (10/19 1500) HEMODYNAMICS:   VENTILATOR SETTINGS: Vent Mode:  [-] PRVC FiO2 (%):  [40 %-100 %] 40 % Set Rate:  [12 bmp-16 bmp] 16 bmp Vt Set:  [480 mL-500 mL] 480 mL PEEP:  [5 cmH20] 5 cmH20 Plateau Pressure:   [13 cmH20-15 cmH20] 15 cmH20 INTAKE / OUTPUT:  Intake/Output Summary (Last 24 hours) at 04/12/15 0854 Last data filed at 04/12/15 0800  Gross per 24 hour  Intake 4360.6 ml  Output   3570 ml  Net  790.6 ml    PHYSICAL EXAMINATION: General:  59 year old female, sedated on vent  Neuro:  Opens eyes, localizes on the left, purposeful on left HEENT:  orally intubated Cardiovascular:  Rrr; no MRG Lungs:  CTA Abdomen:  Soft, non-tender, + bowel sounds no OM Musculoskeletal:  Good st on left, equal bulk Skin:  Intact, brisk CR   LABS:  CBC  Recent Labs Lab 04/11/15 0905 04/11/15 0909  WBC 9.4  --   HGB 15.2* 16.7*  HCT 44.8 49.0*  PLT 192  --    Coag's  Recent Labs Lab 04/11/15 0905  APTT 24  INR 1.00   BMET  Recent Labs Lab 04/11/15 0905 04/11/15 0909  NA 135 138  K 4.1 4.0  CL 102 102  CO2 25  --   BUN 9 10  CREATININE 0.62 0.50  GLUCOSE 325* 338*   Electrolytes  Recent Labs Lab 04/11/15 0905  CALCIUM 9.1   Sepsis Markers No results for input(s): LATICACIDVEN, PROCALCITON, O2SATVEN in the last 168 hours. ABG  Recent Labs Lab 04/11/15 1545  PHART 7.408  PCO2ART 32.5*  PO2ART 368*   Liver Enzymes  Recent Labs Lab 04/11/15 0905  AST 15  ALT 16  ALKPHOS 66  BILITOT 0.4  ALBUMIN 3.6   Cardiac Enzymes No results for input(s): TROPONINI, PROBNP in the last 168 hours. Glucose  Recent Labs Lab 04/11/15 0920 04/11/15 1505 04/11/15 2048 04/12/15 0009 04/12/15 0418 04/12/15 0733  GLUCAP 313* 295* 254* 313* 222* 240*    Imaging Ct Head Wo Contrast  04/12/2015  CLINICAL DATA:  Followup hemorrhage. History of hypertension, diabetes, breast cancer. Status post LEFT MCA stroke and occlusion, embolectomy and stent placement. EXAM: CT HEAD WITHOUT CONTRAST TECHNIQUE: Contiguous axial images were obtained from the base of the skull through the vertex without intravenous contrast. COMPARISON:  CT head April 11, 2015 FINDINGS: Wedge-like  hypodensity LEFT frontotemporal parietal lobes, LEFT anterior basal ganglia. Patchy areas of hemorrhage in LEFT periinsular parenchyma. Local sulcal effacement, no significant midline shift. No abnormal extra-axial fluid collections. Resolution of pneumocephalus. Mildly dense intracranial vessels, particularly the RIGHT MCA. Basal cisterns are patent. Mild calcific atherosclerosis of the carotid siphons. Trace ethmoid mucosal thickening without paranasal sinus air-fluid levels. The imaged mastoid air cells are well aerated. No skull fracture. Ocular globes and orbital contents are unremarkable. IMPRESSION: Acute large LEFT MCA territory infarct with similar patchy areas of hemorrhage. Local mass effect without midline shift. Mildly dense intracranial vessels most compatible with residual contrast. Electronically Signed   By: Elon Alas M.D.   On: 04/12/2015 06:37   Ct Head Wo Contrast  04/11/2015  CLINICAL DATA:  Code stroke. Post left carotid is clot retrieval. Left MCA stroke. EXAM: CT HEAD WITHOUT CONTRAST TECHNIQUE: Contiguous axial images were obtained from the base of the skull through the vertex without intravenous contrast. COMPARISON:  CT head 04/11/2015 FINDINGS: Interval development of acute hemorrhage in the left temporal lobe. Anterior component of hemorrhage measures approximately 10 x 15 mm. Posterior area of hemorrhage measures approximately 20 x 25 mm. It is difficult to determine if this is subarachnoid or parenchymal hemorrhage. This could be within the sylvian fissure. There are several small gas bubbles adjacent to the more posterior area of hemorrhage which could be intravascular or in the subarachnoid space. Acute infarct in the left temporal lobe as noted previously. There is ill-defined low-density in the insula and left temporal lobe compatible with acute infarct. Possible acute infarct left basal ganglia. Ventricle size normal. Edema in the left hemisphere without significant  midline shift. No other areas of hemorrhage. IMPRESSION: Acute hemorrhage in the left temporal lobe. This may be within the sylvian fissure rather than the insular cortex. There are small gas bubbles in the area of hemorrhage which may be extra vascular gas in the subarachnoid space. This is likely related to the procedure and micro perforation of a vessel. Acute infarct left temporal lobe and possibly left basal ganglia. There is local edema without midline shift Critical Value/emergent results were called by telephone at the time of interpretation on 04/11/2015 at 1:54 pm to Dr. Wallie Char and Marlise Eves , who verbally acknowledged these results. Electronically Signed   By: Franchot Gallo M.D.   On: 04/11/2015 13:55   Ct Head Wo Contrast  04/11/2015  CLINICAL DATA:  Previous code stroke patient now with worsening mental status. EXAM: CT HEAD WITHOUT CONTRAST TECHNIQUE: Contiguous axial images were obtained from the base of the skull through the vertex without intravenous contrast. COMPARISON:  Earlier same day FINDINGS: The study suffers from motion degradation. Mild hyperdensity in the left MCA remains visible. Early loss of gray-white differentiation is evident in the left temporal lobe and frontoparietal region consistent with early  changes of left middle cerebral artery territory infarction. There is low-density in the basal ganglia. Aspects score would be 5 or 6. No evidence of hemorrhage. No evidence of shift. No hydrocephalus. No extra-axial collection. IMPRESSION: Slightly more conspicuous loss of gray-white differentiation affecting the left temporal lobe and frontoparietal regions in the left basal ganglia consistent with acute left MCA infarction. Aspects score difficult because of some motion, but estimated at 5 or 6. Electronically Signed   By: Nelson Chimes M.D.   On: 04/11/2015 10:52   Ct Head Wo Contrast  04/11/2015  CLINICAL DATA:  Right-sided weakness and aphasia this morning. EXAM: CT  HEAD WITHOUT CONTRAST TECHNIQUE: Contiguous axial images were obtained from the base of the skull through the vertex without intravenous contrast. COMPARISON:  04/20/2008 FINDINGS: There is a dense left MCA sign consistent with thrombus. There may be mild edema in the insular cortex region. The gray-white differentiation is maintained. No mass effect or hemorrhage. No extra-axial fluid collections. The brainstem and cerebellum are grossly normal. IMPRESSION: CT findings consistent with early left MCA infarct with a dense left MCA sign. No mass effect or hemorrhage. These results were called by telephone at the time of interpretation on 04/11/2015 at 9:29 am to Dr. Nicole Kindred , who verbally acknowledged these results. Electronically Signed   By: Marijo Sanes M.D.   On: 04/11/2015 09:29     ASSESSMENT / PLAN:  PULMONARY OETT 10/19>>> A: Ventilator dependent s/p CVA  P:   ABG noted, keep same MV Wean SBT cpap5 ps 6 , goal 30 min  pcxr in am  upright  CARDIOVASCULAR CVL A:  HTN Clonidine home med P:  SBP goal <140 Tele  Consider addition clonidine early to avoid rebound, as also on high dose nicardipine Echo for clot Assess carotids  RENAL A:   No acute but did get Dye load so at risk for AKI  P:   Gentle hydration, avoid any free water Consider kvo F/u chemistry   GASTROINTESTINAL A:   NPO status At risk for dysphagia  P:   PPI  Consider tubefeeds if not extubated lft in am  Will need slp  HEMATOLOGIC A:   Bleeding risk s/p systemic TPA P:  Cbc in am  scd Consider lovenox in afternoon, per neuro and MRI needed prior  INFECTIOUS A:   No acute  P:   Trend fever and WBC curve   ENDOCRINE A:   Hyperglycemia  NICE goals 140-180 P:   ssi protocol to moderate When Tf start, consider lantus  NEUROLOGIC A:   Acute left MCA CVA; now s/p partial dose systemic TPA; and arterial embolectomy w/ ICA stent placement  Possible left temporal ICH  High risk brain  swelling ,noted on CT P:   RASS goal: 0 PAD protocol, prop , wua  BP goal < 140 F/u cva Consider 3%, I will d/w neuro  FAMILY  - Updates: updated family in room  - Inter-disciplinary family meet or Palliative Care meeting due by: 10/26  Ccm time 30 min   [name]. [name], MD, [place] Pgr: [facility]

## 2015-04-12 NOTE — Progress Notes (Signed)
Patient attempted to pull ETT. RT advanced tube from 19 to 21 and replaced the tube holder. RN aware.

## 2015-04-12 NOTE — Progress Notes (Signed)
OT Cancellation Note  Patient Details Name: DONABELLE MOLDEN MRN: [id number] DOB: 12-21-1955   Cancelled Treatment:    Reason Eval/Treat Not Completed: Patient not medically ready (active bedrest orders)  Laurie, OTR/L  [phone] 04/12/2015 04/12/2015, 6:56 AM

## 2015-04-12 NOTE — Progress Notes (Signed)
Patient transported to CT and back on ventilator without complications.

## 2015-04-12 NOTE — Progress Notes (Signed)
PT Cancellation Note  Patient Details Name: TOCCARA ALFORD MRN: [id number] DOB: 09-08-1955   Cancelled Treatment:    Reason Eval/Treat Not Completed: Patient not medically ready.  Pt vented and sedated.  Will see as appropriate and able. 04/12/2015  Donnella Sham, [place] [phone]  (pager)   Michole Lecuyer, Tessie Fass 04/12/2015, 1:56 PM

## 2015-04-12 NOTE — Progress Notes (Signed)
STROKE TEAM PROGRESS NOTE   HISTORY Kerry [name] is an 59 y.o. female with a history of diabetes mellitus, hypertension and hyperlipidemia brought to the emergency room following acute onset of speech difficulty as well as right facial and extremity weakness (LKW 6:30 AM on 04/11/2015). Patient has no previous history of stroke nor TIA. She has not been on antiplatelet therapy. CT scan of her head showed no signs of acute hemorrhage. Dense left MCA was noted, indicative of acute thrombus. Patient was deemed a candidate for TPA which was started. She had repeated problems with hypertension requiring intervention with IV labetalol as well as hydralazine. Complete dose of TPA was not administered. Patient was taken to interventional radiology for further management following a repeat CT scan of her head to rule out intracranial hemorrhage after worsening of right hemiparesis and development of lethargy. Repeat study showed no signs of acute intracranial hemorrhage. Study did show early signs of possible MCA territory stroke involving left temporal and frontoparietal regions as well as left basal ganglia area. She was taken to IR where she received a carotid stent and embolectomy with TICI 2A revascularization. She was admitted to the neuro ICU for further evaluation and treatment.   SUBJECTIVE (INTERVAL HISTORY) Her family is at the bedside.  Overall she feels her condition is rapidly worsening. She has significant edema with hemorrhage on CT. CCM also at bedside.   OBJECTIVE Temp:  [97.5 F (36.4 C)-102.2 F (39 C)] 99.9 F (37.7 C) (10/20 0800) Pulse Rate:  [43-128] 77 (10/20 0908) Cardiac Rhythm:  [-] Normal sinus rhythm;Sinus bradycardia (10/20 0400) Resp:  [14-27] 21 (10/20 0908) BP: (104-213)/(45-101) 138/45 mmHg (10/20 0908) SpO2:  [94 %-100 %] 100 % (10/20 0908) FiO2 (%):  [40 %-100 %] 40 % (10/20 0908) Weight:  [85.1 kg (187 lb 9.8 oz)] 85.1 kg (187 lb 9.8 oz) (10/19 1500)  CBC:    Recent Labs Lab 04/11/15 0905 04/11/15 0909  WBC 9.4  --   NEUTROABS 6.4  --   HGB 15.2* 16.7*  HCT 44.8 49.0*  MCV 86.3  --   PLT 192  --     Basic Metabolic Panel:   Recent Labs Lab 04/11/15 0905 04/11/15 0909  NA 135 138  K 4.1 4.0  CL 102 102  CO2 25  --   GLUCOSE 325* 338*  BUN 9 10  CREATININE 0.62 0.50  CALCIUM 9.1  --     Lipid Panel:     Component Value Date/Time   CHOL 208* 04/12/2015 0315   TRIG 231* 04/12/2015 0315   HDL 29* 04/12/2015 0315   CHOLHDL 7.2 04/12/2015 0315   VLDL 46* 04/12/2015 0315   LDLCALC 133* 04/12/2015 0315   HgbA1c: No results found for: HGBA1C Urine Drug Screen: No results found for: LABOPIA, COCAINSCRNUR, LABBENZ, AMPHETMU, THCU, LABBARB    IMAGING  Ct Head Wo Contrast  04/12/2015  Acute large LEFT MCA territory infarct with similar patchy areas of hemorrhage. Local mass effect without midline shift. Mildly dense intracranial vessels most compatible with residual contrast.   04/11/2015  Acute hemorrhage in the left temporal lobe. This may be within the sylvian fissure rather than the insular cortex. There are small gas bubbles in the area of hemorrhage which may be extra vascular gas in the subarachnoid space. This is likely related to the procedure and micro perforation of a vessel. Acute infarct left temporal lobe and possibly left basal ganglia. There is local edema without midline shift  04/11/2015  Slightly more conspicuous loss of gray-white differentiation affecting the left temporal lobe and frontoparietal regions in the left basal ganglia consistent with acute left MCA infarction. Aspects score difficult because of some motion, but estimated at 5 or 6.   04/11/2015  CT findings consistent with early left MCA infarct with a dense left MCA sign. No mass effect or hemorrhage.     PHYSICAL EXAM Middle-aged obese Caucasian lady who is intubated and sedated. . Afebrile. Head is nontraumatic. Neck is supple without  bruit.    Cardiac exam no murmur or gallop. Lungs are clear to auscultation. Distal pulses are well felt. Neurological Exam :  Comatose with eyes closed. Opens eyes partially to sternal rub. Left gaze deviation. Right pupil 2 mm ,left 4 mm both sluggishly reactive. Corneal reflexes are present. Fundi were not visualized. Does not blink to threat bilaterally. Right lower facial weakness. Tongue midline. Weak cough and gag. Most left upper and lower extremity; purposefully to painful stimuli. Minimum right UE movement to pain.Right lower extremity withdraws modestly to painful stimuli. Right plantar equivocal left downgoing. Withdraws to painful stimuli more on the left than the right. Gait cannot be tested ASSESSMENT/PLAN [name] is a 59 y.o. female with history of diabetes mellitus, hypertension, hyperlipidemia and fibromyalgia presenting with new onset speech difficulty and right hemiparesis. She received IV t-PA (total 50 mg, 19 mg not received) due to elevated BP. 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 Post tPA/procedure symptomatic ICH with cytotoxic cerebral edema, midline shift  Resultant  Respiratory failure, comatose state, right hemiparesis, dysphagia, cerebral edema, brain herniation  CT this am confirms patch post tPA hemorrhage  Repeat CT head in am  2D Echo  pending   LDL 133  HgbA1c pending  SCDs for VTE prophylaxis Diet NPO time specified  No antithrombotic prior to admission  Ongoing aggressive stroke risk factor management  Patients prognosis for meaningful recovery is poor.  She is critically ill. If she survives, she will need lifelong 24h nursing care.  Dr. [name] discussed diagnosis, prognosis,  treatment options and plan of care with family. They are agreeable to DNR at this time. May consider withdrawing care in a few days, based on pt progress.    Therapy recommendations:  pending   Disposition:  pending   Cytotoxic cerebral edema Induced hypernatremia  CCM to place central line  Mannitol 25 gm now  Start 3% saline  Carotid stenosis  L ICA occlusion  S/p acute stent placement ([facility]. [facility] Constable at [facility])  Start on aspirin 300 mg suppository  Acute respiratory Failure  Intubated for procedure  Unable to protect airway  CCM managing  Malignant Hypertension  BP 179/82 on arrival, as high as 213/97  On cardene drip during the night  BP now 138/45  SBP goal < 160 given hemorrhage  Hyperlipidemia  Home meds:  pravachol 40  LDL 133, goal < 70  Resume in hospital once tube placed/pt stable  Plan continue statin at discharge  Bradycardia  Down to the 30s both pre-IR and post CL placement  BB not recommended  Diabetes type II  HgbA1c pending , goal < 7.0  Other Stroke Risk Factors  Cigarette smoker, quit smoking  ETOH use  Obesity, Body mass index is 30.3 kg/(m^2).   Coronary artery disease  L femoral bypass  Other Active Problems  Hx breast cancer  AKI - Cr 0.5  Hospital day # [place] for Pager information 04/12/2015 10:31 AM  I have personally examined this patient, reviewed notes, independently viewed imaging studies, participated in medical decision making and plan of care. I have made any additions or clarifications directly to the above note. Agree with note above.  She presented with aphasia and right hemiparesis due to left proximal ICA occlusion with distal embolization. She was treated with IV TPA and mechanical embolectomy but unfortunately has a large left MCA infarct with small areas of hemorrhage. She is at significant risk for cytotoxic  edema, brain herniation and neurological worsening and death.. I had a long discussion with the patient's husband and son at the bedside and explained the poor prognosis and plan of care. Family understands  Her poor prognosisthey agreed to DO NOT RESUSCITATE but would like to pursue antiedema measures but would not want heroic measures like hemicraniectomy. They will likely make a decision over the next few days whether they want prolonged ventilatory support, tracheostomy, PEG tube, nursing home or not This patient is critically ill and at significant risk of neurological worsening, death and care requires constant monitoring of vital signs, will start hypertonic saline to control cerebral edema and maintain strict control of blood pressure. hemodynamics,respiratory and cardiac monitoring, extensive review of multiple databases, frequent neurological assessment, discussion with family, other specialists and medical decision making of high complexity.I have made any additions or clarifications directly to the above note.This critical care time does not reflect procedure time, or teaching time or supervisory time of PA/NP/Med Resident etc but could involve care discussion time.  I spent 50 minutes of neurocritical care time  in the care of  this patient.     Antony Contras, MD Medical Director [facility] Pager: [phone] 04/12/2015 3:37 PM    To contact Stroke Continuity provider, please refer to [email or web address] After hours, contact General Neurology

## 2015-04-12 NOTE — Progress Notes (Signed)
Echocardiogram 2D Echocardiogram has been performed.  Tresa Res 04/12/2015, 11:03 AM

## 2015-04-12 NOTE — Procedures (Signed)
Central Venous Catheter Insertion Procedure Note Kerry [name] [id number] 1956/06/23  Procedure: Insertion of Central Venous Catheter Indications: Assessment of intravascular volume, Drug and/or fluid administration, Frequent blood sampling and 3%  Procedure Details Consent: Risks of procedure as well as the alternatives and risks of each were explained to the (patient/caregiver).  Consent for procedure obtained. Time Out: Verified patient identification, verified procedure, site/side was marked, verified correct patient position, special equipment/implants available, medications/allergies/relevent history reviewed, required imaging and test results available.  Performed  Maximum sterile technique was used including antiseptics, cap, gloves, gown, hand hygiene, mask and sheet. Skin prep: Chlorhexidine; local anesthetic administered A antimicrobial bonded/coated triple lumen catheter was placed in the right internal jugular vein using the Seldinger technique.  Evaluation Blood flow good Complications: No apparent complications Patient did tolerate procedure well. Chest X-ray ordered to verify placement.  CXR: pending.  [name] 04/12/2015, 9:36 AM  [place]  Daniel J. [name], MD, [place] Pgr: [facility]

## 2015-04-12 NOTE — Progress Notes (Signed)
Inpatient Diabetes Program Recommendations  AACE/ADA: New Consensus Statement on Inpatient Glycemic Control (2015)  Target Ranges:  Prepandial:   less than 140 mg/dL      Peak postprandial:   less than 180 mg/dL (1-2 hours)      Critically ill patients:  140 - 180 mg/dL   Review of Glycemic Control:Results for Kerry [name], Kerry [name] (MRN [id number]) as of 04/12/2015 14:24  Ref. Range 04/11/2015 15:05 04/11/2015 20:48 04/12/2015 00:09 04/12/2015 04:18 04/12/2015 07:33  Glucose-Capillary Latest Ref Range: 65-99 mg/dL 295 (H) 254 (H) 313 (H) 222 (H) 240 (H)      Inpatient Diabetes Program Recommendations:    If appropriate, please consider adding 1/2 of patient's home dose of Lantus (30 units).  Consider Lantus 15 units daily.   Thanks, Adah Perl, RN, BC-ADM Inpatient Diabetes Coordinator Pager [phone] (8a-5p)

## 2015-04-12 NOTE — Progress Notes (Signed)
SLP Cancellation Note  Patient Details Name: [name] MRN: [id number] DOB: Jan 10, 1956   Cancelled treatment:       Reason Eval/Treat Not Completed: Medical issues which prohibited therapy (Remains intubated. Will f/u 10/21).  [place], CCC-SLP [phone]    Breeze Berringer Meryl 04/12/2015, 10:56 AM

## 2015-04-13 ENCOUNTER — Encounter (HOSPITAL_COMMUNITY)
Admission: EM | Disposition: A | Discharge status: Other Acute Inpatient Hospital | Payer: 59 | Source: Home / Self Care | Attending: Neurology

## 2015-04-13 ENCOUNTER — Inpatient Hospital Stay (HOSPITAL_COMMUNITY): Payer: 59

## 2015-04-13 ENCOUNTER — Encounter (HOSPITAL_COMMUNITY): Payer: Self-pay

## 2015-04-13 DIAGNOSIS — R001 Bradycardia, unspecified: Secondary | ICD-10-CM

## 2015-04-13 DIAGNOSIS — I455 Other specified heart block: Secondary | ICD-10-CM | POA: Insufficient documentation

## 2015-04-13 HISTORY — PX: CARDIAC CATHETERIZATION: SHX172

## 2015-04-13 LAB — COMPREHENSIVE METABOLIC PANEL
ALBUMIN: 2.6 g/dL — AB (ref 3.5–5.0)
ALK PHOS: 52 U/L (ref 38–126)
ALT: 22 U/L (ref 14–54)
ANION GAP: 5 (ref 5–15)
AST: 21 U/L (ref 15–41)
BUN: 11 mg/dL (ref 6–20)
CHLORIDE: 117 mmol/L — AB (ref 101–111)
CO2: 24 mmol/L (ref 22–32)
Calcium: 8.1 mg/dL — ABNORMAL LOW (ref 8.9–10.3)
Creatinine, Ser: 0.47 mg/dL (ref 0.44–1.00)
GFR calc Af Amer: >60 mL/min (ref >60)
GFR calc non Af Amer: >60 mL/min (ref >60)
Glucose, Bld: 223 mg/dL — ABNORMAL HIGH (ref 65–99)
Potassium: 3.2 mmol/L — ABNORMAL LOW (ref 3.5–5.1)
SODIUM: 146 mmol/L — AB (ref 135–145)
Total Bilirubin: 0.6 mg/dL (ref 0.3–1.2)
Total Protein: 5.2 g/dL — ABNORMAL LOW (ref 6.5–8.1)

## 2015-04-13 LAB — CBC WITH DIFFERENTIAL/PLATELET
BASOS PCT: 0 %
Basophils Absolute: 0 10*3/uL (ref 0.0–0.1)
EOS ABS: 0 10*3/uL (ref 0.0–0.7)
EOS PCT: 0 %
HCT: 36.4 % (ref 36.0–46.0)
HEMOGLOBIN: 12 g/dL (ref 12.0–15.0)
LYMPHS PCT: 17 %
Lymphs Abs: 1.8 10*3/uL (ref 0.7–4.0)
MCH: 29.8 pg (ref 26.0–34.0)
MCHC: 33 g/dL (ref 30.0–36.0)
MCV: 90.3 fL (ref 78.0–100.0)
Monocytes Absolute: 1.1 10*3/uL — ABNORMAL HIGH (ref 0.1–1.0)
Monocytes Relative: 10 %
NEUTROS PCT: 73 %
Neutro Abs: 7.7 10*3/uL (ref 1.7–7.7)
Platelets: ADEQUATE 10*3/uL (ref 150–400)
RBC: 4.03 MIL/uL (ref 3.87–5.11)
RDW: 14 % (ref 11.5–15.5)
WBC: 10.6 10*3/uL — ABNORMAL HIGH (ref 4.0–10.5)

## 2015-04-13 LAB — GLUCOSE, CAPILLARY
GLUCOSE-CAPILLARY: 224 mg/dL — AB (ref 65–99)
GLUCOSE-CAPILLARY: 173 mg/dL — AB (ref 65–99)
GLUCOSE-CAPILLARY: 231 mg/dL — AB (ref 65–99)
Glucose-Capillary: 261 mg/dL — ABNORMAL HIGH (ref 65–99)
Glucose-Capillary: 255 mg/dL — ABNORMAL HIGH (ref 65–99)
Glucose-Capillary: 145 mg/dL — ABNORMAL HIGH (ref 65–99)

## 2015-04-13 LAB — BASIC METABOLIC PANEL
ANION GAP: 6 (ref 5–15)
BUN: 16 mg/dL (ref 6–20)
CALCIUM: 8.6 mg/dL — AB (ref 8.9–10.3)
CO2: 24 mmol/L (ref 22–32)
Chloride: 127 mmol/L — ABNORMAL HIGH (ref 101–111)
Creatinine, Ser: 0.48 mg/dL (ref 0.44–1.00)
GLUCOSE: 275 mg/dL — AB (ref 65–99)
POTASSIUM: 2.8 mmol/L — AB (ref 3.5–5.1)
Sodium: 157 mmol/L — ABNORMAL HIGH (ref 135–145)

## 2015-04-13 LAB — SODIUM
SODIUM: 153 mmol/L — AB (ref 135–145)
Sodium: 154 mmol/L — ABNORMAL HIGH (ref 135–145)

## 2015-04-13 LAB — HEMOGLOBIN A1C
Hgb A1c MFr Bld: 11.4 % — ABNORMAL HIGH (ref 4.8–5.6)
MEAN PLASMA GLUCOSE: 280 mg/dL

## 2015-04-13 SURGERY — TEMPORARY PACEMAKER
Anesthesia: LOCAL

## 2015-04-13 MED ORDER — LABETALOL HCL 5 MG/ML IV SOLN
10.0000 mg | INTRAVENOUS | Status: DC | PRN
  Administered 2015-04-14: 10 mg via INTRAVENOUS
  Filled 2015-04-13: qty 4

## 2015-04-13 MED ORDER — LIDOCAINE HCL (PF) 1 % IJ SOLN
INTRAMUSCULAR | Status: AC
  Filled 2015-04-13: qty 30

## 2015-04-13 MED ORDER — AMLODIPINE BESYLATE 10 MG PO TABS: 10.0000 mg | ORAL_TABLET | Freq: Every day | ORAL | Status: DC

## 2015-04-13 MED ORDER — MIDAZOLAM HCL 2 MG/2ML IJ SOLN
2.0000 mg | INTRAMUSCULAR | Status: DC | PRN
  Administered 2015-04-18: 2 mg via INTRAVENOUS
  Filled 2015-04-13: qty 2

## 2015-04-13 MED ORDER — PRAVASTATIN SODIUM 40 MG PO TABS
40.0000 mg | ORAL_TABLET | Freq: Every day | ORAL | Status: DC
  Administered 2015-04-13 – 2015-04-27 (×15): 40 mg
  Filled 2015-04-13 (×15): qty 1

## 2015-04-13 MED ORDER — ANTISEPTIC ORAL RINSE SOLUTION (CORINZ)
7.0000 mL | OROMUCOSAL | Status: DC
  Administered 2015-04-13 – 2015-04-25 (×137): 7 mL via OROMUCOSAL

## 2015-04-13 MED ORDER — VITAL HIGH PROTEIN PO LIQD
1000.0000 mL | ORAL | Status: DC
  Administered 2015-04-13: 22:00:00
  Administered 2015-04-13 – 2015-04-20 (×7): 1000 mL
  Filled 2015-04-13 (×10): qty 1000

## 2015-04-13 MED ORDER — FENTANYL CITRATE (PF) 100 MCG/2ML IJ SOLN
INTRAMUSCULAR | Status: AC
  Administered 2015-04-13: 100 ug
  Filled 2015-04-13: qty 2

## 2015-04-13 MED ORDER — SENNOSIDES 8.8 MG/5ML PO SYRP
5.0000 mL | ORAL_SOLUTION | Freq: Two times a day (BID) | ORAL | Status: DC | PRN
  Filled 2015-04-13: qty 5

## 2015-04-13 MED ORDER — INSULIN GLARGINE 100 UNIT/ML ~~LOC~~ SOLN
15.0000 [IU] | Freq: Every day | SUBCUTANEOUS | Status: DC
  Administered 2015-04-13 – 2015-04-19 (×6): 15 [IU] via SUBCUTANEOUS
  Filled 2015-04-13 (×8): qty 0.15

## 2015-04-13 MED ORDER — AMLODIPINE BESYLATE 10 MG PO TABS
10.0000 mg | ORAL_TABLET | Freq: Every day | ORAL | Status: DC
  Administered 2015-04-13 – 2015-04-18 (×6): 10 mg
  Filled 2015-04-13 (×6): qty 1

## 2015-04-13 MED ORDER — PANTOPRAZOLE SODIUM 40 MG PO PACK
40.0000 mg | PACK | ORAL | Status: DC
  Administered 2015-04-13 – 2015-04-27 (×14): 40 mg
  Filled 2015-04-13 (×15): qty 20

## 2015-04-13 MED ORDER — HEPARIN (PORCINE) IN NACL 2-0.9 UNIT/ML-% IJ SOLN
INTRAMUSCULAR | Status: AC
Start: 2015-04-13 — End: 2015-04-13
  Filled 2015-04-13: qty 500

## 2015-04-13 MED ORDER — PRAVASTATIN SODIUM 40 MG PO TABS: 40.0000 mg | ORAL_TABLET | Freq: Every day | ORAL | Status: DC

## 2015-04-13 MED ORDER — ACETAMINOPHEN 160 MG/5ML PO SOLN
650.0000 mg | ORAL | Status: DC | PRN
  Administered 2015-04-13 – 2015-04-22 (×14): 650 mg
  Filled 2015-04-13 (×14): qty 20.3

## 2015-04-13 MED ORDER — SODIUM CHLORIDE 0.9 % IV SOLN
25.0000 ug/h | INTRAVENOUS | Status: DC
  Administered 2015-04-13 – 2015-04-17 (×4): 25 ug/h via INTRAVENOUS
  Filled 2015-04-13 (×4): qty 50

## 2015-04-13 MED ORDER — HYDRALAZINE HCL 20 MG/ML IJ SOLN
5.0000 mg | Freq: Four times a day (QID) | INTRAMUSCULAR | Status: DC | PRN
  Administered 2015-04-14: 5 mg via INTRAVENOUS
  Administered 2015-04-15 – 2015-04-21 (×5): 10 mg via INTRAVENOUS
  Filled 2015-04-13 (×6): qty 1

## 2015-04-13 MED ORDER — HEPARIN (PORCINE) IN NACL 2-0.9 UNIT/ML-% IJ SOLN
INTRAMUSCULAR | Status: DC | PRN
  Administered 2015-04-13: 17:00:00

## 2015-04-13 MED ORDER — PRO-STAT SUGAR FREE PO LIQD
60.0000 mL | Freq: Two times a day (BID) | ORAL | Status: DC
  Administered 2015-04-14 – 2015-04-21 (×14): 60 mL
  Filled 2015-04-13 (×14): qty 60

## 2015-04-13 MED ORDER — ASPIRIN 325 MG PO TABS
325.0000 mg | ORAL_TABLET | Freq: Every day | ORAL | Status: DC
  Administered 2015-04-13 – 2015-04-17 (×5): 325 mg
  Filled 2015-04-13 (×6): qty 1

## 2015-04-13 MED ORDER — FENTANYL CITRATE (PF) 100 MCG/2ML IJ SOLN: 50.0000 ug | INTRAMUSCULAR | Status: DC | PRN

## 2015-04-13 MED ORDER — BISACODYL 10 MG RE SUPP: 10.0000 mg | Freq: Every day | RECTAL | Status: DC | PRN

## 2015-04-13 SURGICAL SUPPLY — 4 items
CATH S G BIP PACING (SET/KITS/TRAYS/PACK) ×2 IMPLANT
PACK CARDIAC CATHETERIZATION (CUSTOM PROCEDURE TRAY) ×2 IMPLANT
SHEATH PINNACLE 6F 10CM (SHEATH) ×2 IMPLANT
SLEEVE REPOSITIONING LENGTH 30 (MISCELLANEOUS) ×2 IMPLANT

## 2015-04-13 NOTE — Progress Notes (Signed)
OT Cancellation Note  Patient Details Name: Kerry [name] MRN: [id number] DOB: 05-28-1956   Cancelled Treatment:    Reason Eval/Treat Not Completed: Patient not medically ready (intubated/sedated)  [place], OTR/L  [phone] 04/13/2015 04/13/2015, 6:43 AM

## 2015-04-13 NOTE — Progress Notes (Signed)
Patient transported to IR for procedure.  No complications during transport to/from, as well as during procedure.

## 2015-04-13 NOTE — Progress Notes (Signed)
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  Remained on vent post-procedure and PCCM consulted.  STUDIES:  10/19 CT head >> Lt MCA infarct 10/20 Echo >> EF 55 to 60%  SIGNIFICANT EVENTS: 10/19 Admit, limited tPA, embolectomy with stenting 10/20 start 3% NS 10/21 Bradycardia from propofol, fever  SUBJECTIVE:  Bradycardia over night.  VITAL SIGNS: Temp:  [98.4 F (36.9 C)-100.8 F (38.2 C)] 99.5 F (37.5 C) (10/21 0900) Pulse Rate:  [44-86] 70 (10/21 0900) Resp:  [14-24] 18 (10/21 0900) BP: (124-180)/(44-146) 133/82 mmHg (10/21 0900) SpO2:  [100 %] 100 % (10/21 0900) FiO2 (%):  [40 %-100 %] 40 % (10/21 0755) Weight:  [187 lb 9.8 oz (85.1 kg)] 187 lb 9.8 oz (85.1 kg) (10/21 0500) HEMODYNAMICS: CVP:  [3 mmHg-12 mmHg] 6 mmHg VENTILATOR SETTINGS: Vent Mode:  [-] PSV FiO2 (%):  [40 %-100 %] 40 % Set Rate:  [16 bmp] 16 bmp Vt Set:  [480 mL] 480 mL PEEP:  [5 cmH20] 5 cmH20 Pressure Support:  [5 cmH20-8 cmH20] 5 cmH20 Plateau Pressure:  [13 cmH20-16 cmH20] 15 cmH20 INTAKE / OUTPUT:  Intake/Output Summary (Last 24 hours) at 04/13/15 0958 Last data filed at 04/13/15 0900  Gross per 24 hour  Intake 2562.31 ml  Output   1900 ml  Net 662.31 ml    PHYSICAL EXAMINATION: General: sedated Neuro:  Moves Lt side HEENT:  ETT in place Cardiovascular:  Regular, no murmur Lungs:  No wheeze Abdomen:  Soft, non tender Musculoskeletal:  No edema Skin:  No rashes  LABS:  CBC  Recent Labs Lab 04/11/15 0905 04/11/15 0909 04/13/15 0723  WBC 9.4  --  10.6*  HGB 15.2* 16.7* 12.0  HCT 44.8 49.0* 36.4  PLT 192  --  PLATELET  CLUMPS NOTED ON SMEAR, COUNT APPEARS ADEQUATE   Coag's  Recent Labs Lab 04/11/15 0905  APTT 24  INR 1.00   BMET  Recent Labs Lab 04/11/15 0905 04/11/15 0909  04/12/15 1525 04/12/15 2139 04/13/15 0240  NA 135 138  < > 144 144 146*  K 4.1 4.0  --   --   --  3.2*  CL 102 102  --   --   --  117*  CO2 25  --   --   --   --  24  BUN 9 10  --   --   --  11  CREATININE 0.62 0.50  --   --   --  0.47  GLUCOSE 325* 338*  --   --   --  223*  < > = values in this interval not displayed. Electrolytes  Recent Labs Lab 04/11/15 0905 04/13/15 0240  CALCIUM 9.1 8.1*   ABG  Recent Labs Lab 04/11/15 1545  PHART 7.408  PCO2ART 32.5*  PO2ART 368*   Liver Enzymes  Recent Labs Lab 04/11/15 0905 04/13/15 0240  AST 15 21  ALT 16 22  ALKPHOS 66 52  BILITOT 0.4 0.6  ALBUMIN 3.6 2.6*   Glucose  Recent Labs Lab 04/12/15 1107 04/12/15 1638 04/12/15 1944 04/12/15 2338 04/13/15 0406 04/13/15 0800  GLUCAP 217* 195* 255* 245* 224* 173*  Imaging Ct Head Wo Contrast  04/13/2015  CLINICAL DATA:  Follow-up examination for intracranial hemorrhage EXAM: CT HEAD WITHOUT CONTRAST TECHNIQUE: Contiguous axial images were obtained from the base of the skull through the vertex without intravenous contrast. COMPARISON:  Prior study from 04/12/2015 as well as earlier exams. FINDINGS: Extensive hypodensity consistent with cytotoxic edema again seen throughout the left MCA territory, consistent with known left MCA territory infarct. Hypodensity is somewhat more well defined as compared to prior study. Involvement of the left caudate and anterior left lentiform nucleus. Previously seen patchy areas of hemorrhage in the left perinsular region continue to resolved, with only faint hyperdensity now seen at this location. Persistent density within the distal left MCA branches in the left sylvian fissure. No new hemorrhage. Similar local mass effect with sulcal effacement and partial effacement of  the left lateral ventricle. No significant midline shift. No new infarct. No extra-axial fluid collection. No hydrocephalus. Basilar cisterns are patent. Scalp soft tissues demonstrate no acute abnormality. Globes and orbits are stable. Scattered mucosal thickening within the ethmoidal air cells. Paranasal sinuses are otherwise well pneumatized. No mastoid effusion. Middle ear cavities are clear. Calvarium intact. IMPRESSION: 1. Large acute left MCA territory infarct with resolving associated hemorrhage. Similar local mass effect without midline shift. 2. No new intracranial process. Electronically Signed   By: Jeannine Boga M.D.   On: 04/13/2015 05:47   Dg Chest Port 1 View  04/13/2015  CLINICAL DATA:  CVA. EXAM: PORTABLE CHEST 1 VIEW COMPARISON:  04/12/2015. FINDINGS: Endotracheal tube, NG tube, right IJ line stable position. Mediastinum and hilar structures normal. Heart size normal. No focal infiltrate. No pleural effusion or pneumothorax. No acute bony abnormality . IMPRESSION: 1. Lines and tubes in stable position. 2. Low lung volumes with mild basilar atelectasis. Electronically Signed   By: [place]   On: 04/13/2015 08:03   Dg Chest Port 1 View  04/12/2015  CLINICAL DATA:  Central line placement.  Acute cerebral infarct. EXAM: PORTABLE CHEST 1 VIEW COMPARISON:  09/12/2013 FINDINGS: Endotracheal tube, right jugular central venous catheter, and nasogastric tube are seen in appropriate position. Both lungs are clear. No evidence of pneumothorax or pleural effusion. Heart size is within normal limits. Surgical clips again seen within the left axillary region. IMPRESSION: Support lines and tubes in appropriate position. No evidence of pneumothorax or other acute findings. Electronically Signed   By: Earle Gell M.D.   On: 04/12/2015 10:42   Dg Abd Portable 1v  04/12/2015  CLINICAL DATA:  Nasogastric tube placement. EXAM: PORTABLE ABDOMEN - 1 VIEW COMPARISON:  None. FINDINGS: A  nasogastric tube is seen with the tip overlying the distal stomach. No evidence of dilated bowel loops. IMPRESSION: Nasogastric tube tip overlies the distal stomach. Electronically Signed   By: Earle Gell M.D.   On: 04/12/2015 10:45     ASSESSMENT / PLAN: NEUROLOGIC A:   Lt MCA infarct s/p limited tPA and embolectomy with stenting. Cytotoxic edema. P:   RASS goal: 0 Change to fentanyl gtt and prn versed 10/21 D/c diprivan 10/21 due to concern for bradycardia  PULMONARY ETT 10/19 >> A: Compromised airway in setting of CVA. Tobacco abuse. P:   Pressure support wean as tolerated >> not ready for extubation trial  F/u CXR  CARDIOVASCULAR Rt IJ CVL 10/20 >> A:  Malignant HTN. Hx of CAD, HTN, PAD, HLD. Bradycardia 10/20 from diprivan. P:  Continue ASA, catapres Resume pravachol, norvasc 10/21 Hold outpt lisinopril, toprol Goal SBP < 160  RENAL A:   Medically induced hypernatremia. Hypokalemia. P:   Goal Na 150 to 155 per neurology Replace electrolytes as needed  GASTROINTESTINAL A:   Nutrition. P:   Tube feeds  Protonix for SUP  HEMATOLOGIC A:  No acute issues. P:  F/u CBC SCD's for DVT prevention  INFECTIOUS A:   Temp 100.8 on 10/21 >> no other signs of infection. P:   If fever recurs, then send cultures and add abx  ENDOCRINE A:  Hx of DM.   P:   SSI Hold outpt glipizide  Updated pt's family at bedside.  She is DNR.  Explained that she might need tracheostomy to assist with vent weaning depending on neuro progress.  D/w Dr. [name].  CC time 33 minutes.  Chesley Mires, MD [facility] Pulmonary/Critical Care 04/13/2015, 10:21 AM Pager:  [phone] After 3pm call: [phone]

## 2015-04-13 NOTE — Progress Notes (Signed)
SLP Cancellation Note  Patient Details Name: Kerry [name] MRN: [id number] DOB: January 15, 1956   Cancelled treatment:       Reason Eval/Treat Not Completed: Medical issues which prohibited therapy.  Remains intubated.   Juan Quam [place] 04/13/2015, 10:02 AM

## 2015-04-13 NOTE — Progress Notes (Addendum)
Na resulted 157. MD notified via text page that RN stopped 3% Saline infusion per order. No further orders at this time.

## 2015-04-13 NOTE — H&P (View-Only) (Signed)
Reason for Consult: bradycardia  HR to 20   Referring Physician: Dr. [name]   PCP:  [name], MD  Primary Cardiologist:new previous [facility]  Kerry [name] is an 59 y.o. female.    Chief Complaint: admitted 04/11/15 with CVA   HPI: 59 year old female admitted with speech difficulty as well as right facial and extremity weakness.  CT head consistent with early left MCA infarct with a dense left MCA sign and underwent TPA.  Her symptoms increased and repeat CT was done with no hemorrhage but Slightly more conspicuous loss of gray-white differentiation affecting the left temporal lobe and frontoparietal regions in the left basal ganglia consistent with acute left MCA infarction. She then had arterial embolectomy w/ ICA stent placement. Post intervention CT chest noted Possible left temporal ICH. She was intubated and on vent followed by CCM.  She has malignant HTN.  Was on cardene drip for control.  She has numerous episodes of HR to 20s and 30s.  meds have been adjusted but  Continues with pauses and brady.  This actually started in IR with stent placement and has continued.   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 Also with DM, HTN, and hyperlipidemia.    Echo this admit EF 55-60%, Aortic valve: Valve area (VTI): 2.24 cm^2. Valve area (Vmax): 2 cm^2. Valve area (Vmean): 2.04 cm^2.    EKG SR with LVH. No acute changes from 2015.  Pt is a full code.  Past Medical History  Diagnosis Date  . Hypertension   . Coronary artery disease   . Diabetes mellitus   . Breast cancer ([place]) 03/10/2007    Left breat  . Degenerative disc disease, cervical   . Degenerative disc disease, lumbar   . Fibromyalgia   . Hyperlipidemia   . Carpal tunnel syndrome on right     Past Surgical History  Procedure Laterality Date  . Cardiac surgery    . Appendectomy    . Breast  surgery    . Mastectomy Left 03/2007  . Cardiac catheterization  2005  . Vein surgery Right 2005  . Tubal ligation Right   . Radiology with anesthesia N/A 04/11/2015    Procedure: RADIOLOGY WITH ANESTHESIA;  Surgeon: Luanne Bras, MD;  Location: [facility];  Service: Radiology;  Laterality: N/A;    Family History  Problem Relation Age of Onset  . Cancer Mother     Breast cancer (right)  . Diabetes Sister   . Cancer Maternal Aunt     Breast cancer  . Cancer Maternal Aunt     Breast cancer  . Cancer Cousin     Breast Cancer   Social History:  reports that she has been smoking.  She has never used smokeless tobacco. She reports that she drinks alcohol. She reports that she does not use illicit drugs.  Allergies:  Allergies  Allergen Reactions  . Oxycodone     Sick   . Oxycontin [Oxycodone Hcl]     Sick     _0 @ _1 @ acetaminophen (TYLENOL) oral liquid 160 mg/5 mL, [DISCONTINUED] acetaminophen **OR** acetaminophen, bisacodyl, fentaNYL (SUBLIMAZE) injection, hydrALAZINE, labetalol, midazolam, sennosides  Results for orders placed or performed during the hospital encounter of 04/11/15 (from the past 48 hour(s))  Glucose, capillary     Status: Abnormal   Collection Time: 04/11/15  3:05 PM  Result Value  Ref Range   Glucose-Capillary 295 (H) 65 - 99 mg/dL  Blood gas, arterial     Status: Abnormal   Collection Time: 04/11/15  3:45 PM  Result Value Ref Range   FIO2 1.00    Delivery systems VENTILATOR    Mode PRESSURE REGULATED VOLUME CONTROL    VT 480 mL   LHR 16 resp/min   Peep/cpap 5.0 cm H20   pH, Arterial 7.408 7.350 - 7.450   pCO2 arterial 32.5 (L) 35.0 - 45.0 mmHg   pO2, Arterial 368 (H) 80.0 - 100.0 mmHg   Bicarbonate 20.1 20.0 - 24.0 mEq/L   TCO2 21.1 0 - 100 mmol/L   Acid-base deficit 3.7 (H) 0.0 - 2.0 mmol/L   O2 Saturation 99.6 %   Patient temperature 98.6    Collection site RIGHT RADIAL    Drawn by [phone]    Sample type ARTERIAL DRAW      Allens test (pass/fail) PASS PASS  Triglycerides     Status: Abnormal   Collection Time: 04/11/15  4:50 PM  Result Value Ref Range   Triglycerides 193 (H) <150 mg/dL  Glucose, capillary     Status: Abnormal   Collection Time: 04/11/15  8:48 PM  Result Value Ref Range   Glucose-Capillary 254 (H) 65 - 99 mg/dL   Comment 1 Notify RN    Comment 2 Document in Chart   Glucose, capillary     Status: Abnormal   Collection Time: 04/12/15 12:09 AM  Result Value Ref Range   Glucose-Capillary 313 (H) 65 - 99 mg/dL  Hemoglobin A1c     Status: Abnormal   Collection Time: 04/12/15  3:15 AM  Result Value Ref Range   Hgb A1c MFr Bld 11.4 (H) 4.8 - 5.6 %    Comment: (NOTE)         Pre-diabetes: 5.7 - 6.4         Diabetes: >6.4         Glycemic control for adults with diabetes: <7.0    Mean Plasma Glucose 280 mg/dL    Comment: (NOTE) Performed At: [facility] [place], [place] [id number] Lindon Romp MD MO:[id number]   Lipid panel     Status: Abnormal   Collection Time: 04/12/15  3:15 AM  Result Value Ref Range   Cholesterol 208 (H) 0 - 200 mg/dL   Triglycerides 231 (H) <150 mg/dL   HDL 29 (L) >40 mg/dL   Total CHOL/HDL Ratio 7.2 RATIO   VLDL 46 (H) 0 - 40 mg/dL   LDL Cholesterol 133 (H) 0 - 99 mg/dL    Comment:        Total Cholesterol/HDL:CHD Risk Coronary Heart Disease Risk Table                     Men   Women  1/2 Average Risk   3.4   3.3  Average Risk       5.0   4.4  2 X Average Risk   9.6   7.1  3 X Average Risk  23.4   11.0        Use the calculated Patient Ratio above and the CHD Risk Table to determine the patient's CHD Risk.        ATP III CLASSIFICATION (LDL):  <100     mg/dL   Optimal  100-129  mg/dL   Near or Above  Optimal  130-159  mg/dL   Borderline  160-189  mg/dL   High  >190     mg/dL   Very High   Glucose, capillary     Status: Abnormal   Collection Time: 04/12/15  4:18 AM  Result Value Ref Range    Glucose-Capillary 222 (H) 65 - 99 mg/dL   Comment 1 Notify RN   Glucose, capillary     Status: Abnormal   Collection Time: 04/12/15  7:33 AM  Result Value Ref Range   Glucose-Capillary 240 (H) 65 - 99 mg/dL  Sodium     Status: None   Collection Time: 04/12/15 10:34 AM  Result Value Ref Range   Sodium 140 135 - 145 mmol/L  Glucose, capillary     Status: Abnormal   Collection Time: 04/12/15 11:07 AM  Result Value Ref Range   Glucose-Capillary 217 (H) 65 - 99 mg/dL  Sodium     Status: None   Collection Time: 04/12/15  3:25 PM  Result Value Ref Range   Sodium 144 135 - 145 mmol/L  Glucose, capillary     Status: Abnormal   Collection Time: 04/12/15  4:38 PM  Result Value Ref Range   Glucose-Capillary 195 (H) 65 - 99 mg/dL  Glucose, capillary     Status: Abnormal   Collection Time: 04/12/15  7:44 PM  Result Value Ref Range   Glucose-Capillary 255 (H) 65 - 99 mg/dL  Sodium     Status: None   Collection Time: 04/12/15  9:39 PM  Result Value Ref Range   Sodium 144 135 - 145 mmol/L  Glucose, capillary     Status: Abnormal   Collection Time: 04/12/15 11:38 PM  Result Value Ref Range   Glucose-Capillary 245 (H) 65 - 99 mg/dL  Comprehensive metabolic panel     Status: Abnormal   Collection Time: 04/13/15  2:40 AM  Result Value Ref Range   Sodium 146 (H) 135 - 145 mmol/L   Potassium 3.2 (L) 3.5 - 5.1 mmol/L    Comment: DELTA CHECK NOTED   Chloride 117 (H) 101 - 111 mmol/L   CO2 24 22 - 32 mmol/L   Glucose, Bld 223 (H) 65 - 99 mg/dL   BUN 11 6 - 20 mg/dL   Creatinine, Ser 0.47 0.44 - 1.00 mg/dL   Calcium 8.1 (L) 8.9 - 10.3 mg/dL   Total Protein 5.2 (L) 6.5 - 8.1 g/dL   Albumin 2.6 (L) 3.5 - 5.0 g/dL   AST 21 15 - 41 U/L   ALT 22 14 - 54 U/L   Alkaline Phosphatase 52 38 - 126 U/L   Total Bilirubin 0.6 0.3 - 1.2 mg/dL   GFR calc non Af Amer >60 >60 mL/min   GFR calc Af Amer >60 >60 mL/min    Comment: (NOTE) The eGFR has been calculated using the CKD EPI equation. This  calculation has not been validated in all clinical situations. eGFR's persistently <60 mL/min signify possible Chronic Kidney Disease.    Anion gap 5 5 - 15  Glucose, capillary     Status: Abnormal   Collection Time: 04/13/15  4:06 AM  Result Value Ref Range   Glucose-Capillary 224 (H) 65 - 99 mg/dL  CBC with Differential/Platelet     Status: Abnormal   Collection Time: 04/13/15  7:23 AM  Result Value Ref Range   WBC 10.6 (H) 4.0 - 10.5 K/uL    Comment: WHITE COUNT CONFIRMED ON SMEAR   RBC 4.03 3.87 -  5.11 MIL/uL   Hemoglobin 12.0 12.0 - 15.0 g/dL    Comment: REPEATED TO VERIFY   HCT 36.4 36.0 - 46.0 %   MCV 90.3 78.0 - 100.0 fL   MCH 29.8 26.0 - 34.0 pg   MCHC 33.0 30.0 - 36.0 g/dL   RDW 14.0 11.5 - 15.5 %   Platelets  150 - 400 K/uL    PLATELET CLUMPS NOTED ON SMEAR, COUNT APPEARS ADEQUATE   Neutrophils Relative % 73 %   Lymphocytes Relative 17 %   Monocytes Relative 10 %   Eosinophils Relative 0 %   Basophils Relative 0 %   Neutro Abs 7.7 1.7 - 7.7 K/uL   Lymphs Abs 1.8 0.7 - 4.0 K/uL   Monocytes Absolute 1.1 (H) 0.1 - 1.0 K/uL   Eosinophils Absolute 0.0 0.0 - 0.7 K/uL   Basophils Absolute 0.0 0.0 - 0.1 K/uL   Smear Review MORPHOLOGY UNREMARKABLE   Glucose, capillary     Status: Abnormal   Collection Time: 04/13/15  8:00 AM  Result Value Ref Range   Glucose-Capillary 173 (H) 65 - 99 mg/dL  Sodium     Status: Abnormal   Collection Time: 04/13/15 10:00 AM  Result Value Ref Range   Sodium 153 (H) 135 - 145 mmol/L  Glucose, capillary     Status: Abnormal   Collection Time: 04/13/15 11:36 AM  Result Value Ref Range   Glucose-Capillary 261 (H) 65 - 99 mg/dL   Ct Head Wo Contrast  04/13/2015  CLINICAL DATA:  Follow-up examination for intracranial hemorrhage EXAM: CT HEAD WITHOUT CONTRAST TECHNIQUE: Contiguous axial images were obtained from the base of the skull through the vertex without intravenous contrast. COMPARISON:  Prior study from 04/12/2015 as well as earlier  exams. FINDINGS: Extensive hypodensity consistent with cytotoxic edema again seen throughout the left MCA territory, consistent with known left MCA territory infarct. Hypodensity is somewhat more well defined as compared to prior study. Involvement of the left caudate and anterior left lentiform nucleus. Previously seen patchy areas of hemorrhage in the left perinsular region continue to resolved, with only faint hyperdensity now seen at this location. Persistent density within the distal left MCA branches in the left sylvian fissure. No new hemorrhage. Similar local mass effect with sulcal effacement and partial effacement of the left lateral ventricle. No significant midline shift. No new infarct. No extra-axial fluid collection. No hydrocephalus. Basilar cisterns are patent. Scalp soft tissues demonstrate no acute abnormality. Globes and orbits are stable. Scattered mucosal thickening within the ethmoidal air cells. Paranasal sinuses are otherwise well pneumatized. No mastoid effusion. Middle ear cavities are clear. Calvarium intact. IMPRESSION: 1. Large acute left MCA territory infarct with resolving associated hemorrhage. Similar local mass effect without midline shift. 2. No new intracranial process. Electronically Signed   By: Jeannine Boga M.D.   On: 04/13/2015 05:47   Ct Head Wo Contrast  04/12/2015  CLINICAL DATA:  Followup hemorrhage. History of hypertension, diabetes, breast cancer. Status post LEFT MCA stroke and occlusion, embolectomy and stent placement. EXAM: CT HEAD WITHOUT CONTRAST TECHNIQUE: Contiguous axial images were obtained from the base of the skull through the vertex without intravenous contrast. COMPARISON:  CT head April 11, 2015 FINDINGS: Wedge-like hypodensity LEFT frontotemporal parietal lobes, LEFT anterior basal ganglia. Patchy areas of hemorrhage in LEFT periinsular parenchyma. Local sulcal effacement, no significant midline shift. No abnormal extra-axial fluid  collections. Resolution of pneumocephalus. Mildly dense intracranial vessels, particularly the RIGHT MCA. Basal cisterns are patent. Mild calcific atherosclerosis  of the carotid siphons. Trace ethmoid mucosal thickening without paranasal sinus air-fluid levels. The imaged mastoid air cells are well aerated. No skull fracture. Ocular globes and orbital contents are unremarkable. IMPRESSION: Acute large LEFT MCA territory infarct with similar patchy areas of hemorrhage. Local mass effect without midline shift. Mildly dense intracranial vessels most compatible with residual contrast. Electronically Signed   By: Elon Alas M.D.   On: 04/12/2015 06:37   Dg Chest Port 1 View  04/13/2015  CLINICAL DATA:  CVA. EXAM: PORTABLE CHEST 1 VIEW COMPARISON:  04/12/2015. FINDINGS: Endotracheal tube, NG tube, right IJ line stable position. Mediastinum and hilar structures normal. Heart size normal. No focal infiltrate. No pleural effusion or pneumothorax. No acute bony abnormality . IMPRESSION: 1. Lines and tubes in stable position. 2. Low lung volumes with mild basilar atelectasis. Electronically Signed   By: [place]   On: 04/13/2015 08:03   Dg Chest Port 1 View  04/12/2015  CLINICAL DATA:  Central line placement.  Acute cerebral infarct. EXAM: PORTABLE CHEST 1 VIEW COMPARISON:  09/12/2013 FINDINGS: Endotracheal tube, right jugular central venous catheter, and nasogastric tube are seen in appropriate position. Both lungs are clear. No evidence of pneumothorax or pleural effusion. Heart size is within normal limits. Surgical clips again seen within the left axillary region. IMPRESSION: Support lines and tubes in appropriate position. No evidence of pneumothorax or other acute findings. Electronically Signed   By: Earle Gell M.D.   On: 04/12/2015 10:42   Dg Abd Portable 1v  04/12/2015  CLINICAL DATA:  Nasogastric tube placement. EXAM: PORTABLE ABDOMEN - 1 VIEW COMPARISON:  None. FINDINGS: A nasogastric  tube is seen with the tip overlying the distal stomach. No evidence of dilated bowel loops. IMPRESSION: Nasogastric tube tip overlies the distal stomach. Electronically Signed   By: Earle Gell M.D.   On: 04/12/2015 10:45    ROS: Per SON  General:no colds or fevers,  weight decrease from 206 to 187 in a year Skin:no rashes or ulcers HEENT:no blurred vision, no congestion that family is aware of. CV:see HPI- no syncope, occ chest pain, seen in ER last year neg. troponin PUL:see HPI no SOB GI:no diarrhea constipation or melena, no indigestion that family is aware of Neuro:no syncope, no lightheadedness Endo:+ diabetes, no thyroid disease That is all family could provide   Blood pressure 141/51, pulse 44, temperature 99.5 F (37.5 C), temperature source Core (Comment), resp. rate 14, height _0  (1.676 m), weight 187 lb 9.8 oz (85.1 kg), SpO2 100 %.  Wt Readings from Last 3 Encounters:  04/13/15 187 lb 9.8 oz (85.1 kg)  02/21/14 206 lb (93.441 kg)  11/08/12 233 lb 4.8 oz (105.824 kg)    PE: General:sedated, on vent, opens eyes when name called Skin:Warm and dry, brisk capillary refill HEENT:normocephalic,  mucus membranes moist Neck:supple, no JVD on Lt IJ in Rt  Heart:S1S2 RRR without murmur, gallup, rub or click Lungs: without rales, + rhonchi, no wheezes UDJ:SHFW, non tender, + BS, do not palpate liver spleen or masses Ext:no lower ext edema, 2+ pedal pulses, 2+ radial pulses Neuro:sedated, opens eyes with name called.     Assessment/Plan Active Problems:   CVA (cerebral infarction)   Stroke (cerebrum) (HCC)   Cerebrovascular accident (CVA) due to occlusion of left middle cerebral artery ([place])   Cerebrovascular accident (CVA) due to thrombosis of precerebral artery ([place])   Encounter for central line placement   Encounter for feeding tube placement  Bradycardia HR to  20s, 4 sec pauses. May be related to stroke, but with increased episodes of brady may need temp pacer.     She is on amlodipine and clonidine. Now on fentanyl drip for sedation, -concern previous propofol was causing Loletha Grayer.  No labetalol since the 19th.  Has had chest pain at home. EKGs are stable.    Hyperlipidemia on pravachol.     [place]  Nurse Practitioner Certified [place] Pager [phone] or after 5pm or weekends call [phone] 04/13/2015, 2:53 PM  Patient was seen with Cecilie Kicks, NP-C. The patient has developed intermittent episodes of severe bradycardia and asystole.  She does not have any prior history of bradycardia.  Her 12-lead EKG earlier today was essentially normal including normal PR interval.  She is now having episodes of sudden severe sinus bradycardia leading to periods of asystole. She is not on any drugs which would be causing this. She will need a temporary pacemaker.  I spoke by phone with the patient's husband who is in agreement with the planned temporary pacemaker insertion and gave his consent.  The patient will be transported urgently to the cardiac catheterization lab where Dr. [place] will place the temporary pacemaker.  The strips are visible in epic.  Serum potassium this morning was low at 3.2.  We will recheck tonight

## 2015-04-13 NOTE — Progress Notes (Signed)
Pt son at bedside and stated that he did not think his dad agreed to DNR. He thought they were going to watch throughout the weekend. The son called the husband and they discussed and wants the patient to make Full code. At this time patient also has experienced occasional pauses lasting from 4-6 sec with heart rate decreasing in the 20's. MD made aware at this time no intervention as patient is holding her blood pressure and urine output. Primary MD to call a cardiology consult.

## 2015-04-13 NOTE — Progress Notes (Signed)
PT Cancellation Note  Patient Details Name: Kerry [name] MRN: [id number] DOB: Jun 25, 1955   Cancelled Treatment:    Reason Eval/Treat Not Completed: Patient not medically ready (remains on bedrest with ventilator support)   Duncan Dull 04/13/2015, 7:33 AM Alben Deeds, PT DPT  [phone]

## 2015-04-13 NOTE — Interval H&P Note (Signed)
History and Physical Interval Note:  04/13/2015 4:48 PM  Kerry [name]  has presented today for surgery, with the diagnosis of stroke, bradycardia  The various methods of treatment have been discussed with the patient and family. After consideration of risks, benefits and other options for treatment, the patient has consented to  Procedure(s): Temporary Pacemaker (N/A) as a surgical intervention .  The patient's history has been reviewed, patient examined, no change in status, stable for surgery.  I have reviewed the patient's chart and labs.  Questions were answered to the patient's satisfaction.     Collier [facility] 04/13/2015 4:48 PM

## 2015-04-13 NOTE — Progress Notes (Signed)
Nutrition Follow-up  DOCUMENTATION CODES:   Obesity unspecified  INTERVENTION:   Increase Vital High Protein to 30 ml/hr via OG tube.   Decrease to 60 ml Prostat BID.   Tube feeding regimen provides 1120 kcal, 123 grams of protein, and 601 ml of H2O.    NUTRITION DIAGNOSIS:   Inadequate oral intake related to inability to eat as evidenced by NPO status. Ongoing.   GOAL:   Provide needs based on ASPEN/SCCM guidelines Met.   MONITOR:   I & O's, Vent status, Labs, TF tolerance, Weight trends   ASSESSMENT:   Pt admitted on 10/19 w/ acute left MCA territory CVA. Admitted to the ICU on full ventilator support s/p partial dose systemic TPA; and arterial embolectomy w/ ICA stent placement. Post intervention CT chest noted Possible left temporal ICH.   Patient is currently intubated on ventilator support MV: 8 L/min Temp (24hrs), Avg:100 F (37.8 C), Min:98.4 F (36.9 C), Max:100.8 F (38.2 C) Propofol: off  Labs reviewed: sodium elevated (per Neuro), potassium low 3.2 CBG's: [phone] Spoke with RN.   Diet Order:  Diet NPO time specified  Skin:  Reviewed, no issues  Last BM:  unknown  Height:   Ht Readings from Last 1 Encounters:  04/11/15 _0  (1.676 m)   Weight:   Wt Readings from Last 1 Encounters:  04/13/15 187 lb 9.8 oz (85.1 kg)   Ideal Body Weight:  59 kg  BMI:  Body mass index is 30.3 kg/(m^2).  Estimated Nutritional Needs:   Kcal:  [phone]  Protein:  118 grams  Fluid:  > 1.5 L/day  EDUCATION NEEDS:   No education needs identified at this time  [place], [place], [place] Pager [phone] After Hours Pager

## 2015-04-13 NOTE — Consult Note (Addendum)
Reason for Consult: bradycardia  HR to 20   Referring Physician: Dr. [name]   PCP:  [name], MD  Primary Cardiologist:new previous [facility]  Kerry [name] is an 59 y.o. female.    Chief Complaint: admitted 04/11/15 with CVA   HPI: 59 year old female admitted with speech difficulty as well as right facial and extremity weakness.  CT head consistent with early left MCA infarct with a dense left MCA sign and underwent TPA.  Her symptoms increased and repeat CT was done with no hemorrhage but Slightly more conspicuous loss of gray-white differentiation affecting the left temporal lobe and frontoparietal regions in the left basal ganglia consistent with acute left MCA infarction. She then had arterial embolectomy w/ ICA stent placement. Post intervention CT chest noted Possible left temporal ICH. She was intubated and on vent followed by CCM.  She has malignant HTN.  Was on cardene drip for control.  She has numerous episodes of HR to 20s and 30s.  meds have been adjusted but  Continues with pauses and brady.  This actually started in IR with stent placement and has continued.   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 Also with DM, HTN, and hyperlipidemia.    Echo this admit EF 55-60%, Aortic valve: Valve area (VTI): 2.24 cm^2. Valve area (Vmax): 2 cm^2. Valve area (Vmean): 2.04 cm^2.    EKG SR with LVH. No acute changes from 2015.  Pt is a full code.  Past Medical History  Diagnosis Date  . Hypertension   . Coronary artery disease   . Diabetes mellitus   . Breast cancer ([place]) 03/10/2007    Left breat  . Degenerative disc disease, cervical   . Degenerative disc disease, lumbar   . Fibromyalgia   . Hyperlipidemia   . Carpal tunnel syndrome on right     Past Surgical History  Procedure Laterality Date  . Cardiac surgery    . Appendectomy    . Breast  surgery    . Mastectomy Left 03/2007  . Cardiac catheterization  2005  . Vein surgery Right 2005  . Tubal ligation Right   . Radiology with anesthesia N/A 04/11/2015    Procedure: RADIOLOGY WITH ANESTHESIA;  Surgeon: Luanne Bras, MD;  Location: [facility];  Service: Radiology;  Laterality: N/A;    Family History  Problem Relation Age of Onset  . Cancer Mother     Breast cancer (right)  . Diabetes Sister   . Cancer Maternal Aunt     Breast cancer  . Cancer Maternal Aunt     Breast cancer  . Cancer Cousin     Breast Cancer   Social History:  reports that she has been smoking.  She has never used smokeless tobacco. She reports that she drinks alcohol. She reports that she does not use illicit drugs.  Allergies:  Allergies  Allergen Reactions  . Oxycodone     Sick   . Oxycontin [Oxycodone Hcl]     Sick     _0 @ _1 @ acetaminophen (TYLENOL) oral liquid 160 mg/5 mL, [DISCONTINUED] acetaminophen **OR** acetaminophen, bisacodyl, fentaNYL (SUBLIMAZE) injection, hydrALAZINE, labetalol, midazolam, sennosides  Results for orders placed or performed during the hospital encounter of 04/11/15 (from the past 48 hour(s))  Glucose, capillary     Status: Abnormal   Collection Time: 04/11/15  3:05 PM  Result Value  Ref Range   Glucose-Capillary 295 (H) 65 - 99 mg/dL  Blood gas, arterial     Status: Abnormal   Collection Time: 04/11/15  3:45 PM  Result Value Ref Range   FIO2 1.00    Delivery systems VENTILATOR    Mode PRESSURE REGULATED VOLUME CONTROL    VT 480 mL   LHR 16 resp/min   Peep/cpap 5.0 cm H20   pH, Arterial 7.408 7.350 - 7.450   pCO2 arterial 32.5 (L) 35.0 - 45.0 mmHg   pO2, Arterial 368 (H) 80.0 - 100.0 mmHg   Bicarbonate 20.1 20.0 - 24.0 mEq/L   TCO2 21.1 0 - 100 mmol/L   Acid-base deficit 3.7 (H) 0.0 - 2.0 mmol/L   O2 Saturation 99.6 %   Patient temperature 98.6    Collection site RIGHT RADIAL    Drawn by [phone]    Sample type ARTERIAL DRAW      Allens test (pass/fail) PASS PASS  Triglycerides     Status: Abnormal   Collection Time: 04/11/15  4:50 PM  Result Value Ref Range   Triglycerides 193 (H) <150 mg/dL  Glucose, capillary     Status: Abnormal   Collection Time: 04/11/15  8:48 PM  Result Value Ref Range   Glucose-Capillary 254 (H) 65 - 99 mg/dL   Comment 1 Notify RN    Comment 2 Document in Chart   Glucose, capillary     Status: Abnormal   Collection Time: 04/12/15 12:09 AM  Result Value Ref Range   Glucose-Capillary 313 (H) 65 - 99 mg/dL  Hemoglobin A1c     Status: Abnormal   Collection Time: 04/12/15  3:15 AM  Result Value Ref Range   Hgb A1c MFr Bld 11.4 (H) 4.8 - 5.6 %    Comment: (NOTE)         Pre-diabetes: 5.7 - 6.4         Diabetes: >6.4         Glycemic control for adults with diabetes: <7.0    Mean Plasma Glucose 280 mg/dL    Comment: (NOTE) Performed At: [facility] [place], [place] [id number] Lindon Romp MD MO:[id number]   Lipid panel     Status: Abnormal   Collection Time: 04/12/15  3:15 AM  Result Value Ref Range   Cholesterol 208 (H) 0 - 200 mg/dL   Triglycerides 231 (H) <150 mg/dL   HDL 29 (L) >40 mg/dL   Total CHOL/HDL Ratio 7.2 RATIO   VLDL 46 (H) 0 - 40 mg/dL   LDL Cholesterol 133 (H) 0 - 99 mg/dL    Comment:        Total Cholesterol/HDL:CHD Risk Coronary Heart Disease Risk Table                     Men   Women  1/2 Average Risk   3.4   3.3  Average Risk       5.0   4.4  2 X Average Risk   9.6   7.1  3 X Average Risk  23.4   11.0        Use the calculated Patient Ratio above and the CHD Risk Table to determine the patient's CHD Risk.        ATP III CLASSIFICATION (LDL):  <100     mg/dL   Optimal  100-129  mg/dL   Near or Above  Optimal  130-159  mg/dL   Borderline  160-189  mg/dL   High  >190     mg/dL   Very High   Glucose, capillary     Status: Abnormal   Collection Time: 04/12/15  4:18 AM  Result Value Ref Range    Glucose-Capillary 222 (H) 65 - 99 mg/dL   Comment 1 Notify RN   Glucose, capillary     Status: Abnormal   Collection Time: 04/12/15  7:33 AM  Result Value Ref Range   Glucose-Capillary 240 (H) 65 - 99 mg/dL  Sodium     Status: None   Collection Time: 04/12/15 10:34 AM  Result Value Ref Range   Sodium 140 135 - 145 mmol/L  Glucose, capillary     Status: Abnormal   Collection Time: 04/12/15 11:07 AM  Result Value Ref Range   Glucose-Capillary 217 (H) 65 - 99 mg/dL  Sodium     Status: None   Collection Time: 04/12/15  3:25 PM  Result Value Ref Range   Sodium 144 135 - 145 mmol/L  Glucose, capillary     Status: Abnormal   Collection Time: 04/12/15  4:38 PM  Result Value Ref Range   Glucose-Capillary 195 (H) 65 - 99 mg/dL  Glucose, capillary     Status: Abnormal   Collection Time: 04/12/15  7:44 PM  Result Value Ref Range   Glucose-Capillary 255 (H) 65 - 99 mg/dL  Sodium     Status: None   Collection Time: 04/12/15  9:39 PM  Result Value Ref Range   Sodium 144 135 - 145 mmol/L  Glucose, capillary     Status: Abnormal   Collection Time: 04/12/15 11:38 PM  Result Value Ref Range   Glucose-Capillary 245 (H) 65 - 99 mg/dL  Comprehensive metabolic panel     Status: Abnormal   Collection Time: 04/13/15  2:40 AM  Result Value Ref Range   Sodium 146 (H) 135 - 145 mmol/L   Potassium 3.2 (L) 3.5 - 5.1 mmol/L    Comment: DELTA CHECK NOTED   Chloride 117 (H) 101 - 111 mmol/L   CO2 24 22 - 32 mmol/L   Glucose, Bld 223 (H) 65 - 99 mg/dL   BUN 11 6 - 20 mg/dL   Creatinine, Ser 0.47 0.44 - 1.00 mg/dL   Calcium 8.1 (L) 8.9 - 10.3 mg/dL   Total Protein 5.2 (L) 6.5 - 8.1 g/dL   Albumin 2.6 (L) 3.5 - 5.0 g/dL   AST 21 15 - 41 U/L   ALT 22 14 - 54 U/L   Alkaline Phosphatase 52 38 - 126 U/L   Total Bilirubin 0.6 0.3 - 1.2 mg/dL   GFR calc non Af Amer >60 >60 mL/min   GFR calc Af Amer >60 >60 mL/min    Comment: (NOTE) The eGFR has been calculated using the CKD EPI equation. This  calculation has not been validated in all clinical situations. eGFR's persistently <60 mL/min signify possible Chronic Kidney Disease.    Anion gap 5 5 - 15  Glucose, capillary     Status: Abnormal   Collection Time: 04/13/15  4:06 AM  Result Value Ref Range   Glucose-Capillary 224 (H) 65 - 99 mg/dL  CBC with Differential/Platelet     Status: Abnormal   Collection Time: 04/13/15  7:23 AM  Result Value Ref Range   WBC 10.6 (H) 4.0 - 10.5 K/uL    Comment: WHITE COUNT CONFIRMED ON SMEAR   RBC 4.03 3.87 -  5.11 MIL/uL   Hemoglobin 12.0 12.0 - 15.0 g/dL    Comment: REPEATED TO VERIFY   HCT 36.4 36.0 - 46.0 %   MCV 90.3 78.0 - 100.0 fL   MCH 29.8 26.0 - 34.0 pg   MCHC 33.0 30.0 - 36.0 g/dL   RDW 14.0 11.5 - 15.5 %   Platelets  150 - 400 K/uL    PLATELET CLUMPS NOTED ON SMEAR, COUNT APPEARS ADEQUATE   Neutrophils Relative % 73 %   Lymphocytes Relative 17 %   Monocytes Relative 10 %   Eosinophils Relative 0 %   Basophils Relative 0 %   Neutro Abs 7.7 1.7 - 7.7 K/uL   Lymphs Abs 1.8 0.7 - 4.0 K/uL   Monocytes Absolute 1.1 (H) 0.1 - 1.0 K/uL   Eosinophils Absolute 0.0 0.0 - 0.7 K/uL   Basophils Absolute 0.0 0.0 - 0.1 K/uL   Smear Review MORPHOLOGY UNREMARKABLE   Glucose, capillary     Status: Abnormal   Collection Time: 04/13/15  8:00 AM  Result Value Ref Range   Glucose-Capillary 173 (H) 65 - 99 mg/dL  Sodium     Status: Abnormal   Collection Time: 04/13/15 10:00 AM  Result Value Ref Range   Sodium 153 (H) 135 - 145 mmol/L  Glucose, capillary     Status: Abnormal   Collection Time: 04/13/15 11:36 AM  Result Value Ref Range   Glucose-Capillary 261 (H) 65 - 99 mg/dL   Ct Head Wo Contrast  04/13/2015  CLINICAL DATA:  Follow-up examination for intracranial hemorrhage EXAM: CT HEAD WITHOUT CONTRAST TECHNIQUE: Contiguous axial images were obtained from the base of the skull through the vertex without intravenous contrast. COMPARISON:  Prior study from 04/12/2015 as well as earlier  exams. FINDINGS: Extensive hypodensity consistent with cytotoxic edema again seen throughout the left MCA territory, consistent with known left MCA territory infarct. Hypodensity is somewhat more well defined as compared to prior study. Involvement of the left caudate and anterior left lentiform nucleus. Previously seen patchy areas of hemorrhage in the left perinsular region continue to resolved, with only faint hyperdensity now seen at this location. Persistent density within the distal left MCA branches in the left sylvian fissure. No new hemorrhage. Similar local mass effect with sulcal effacement and partial effacement of the left lateral ventricle. No significant midline shift. No new infarct. No extra-axial fluid collection. No hydrocephalus. Basilar cisterns are patent. Scalp soft tissues demonstrate no acute abnormality. Globes and orbits are stable. Scattered mucosal thickening within the ethmoidal air cells. Paranasal sinuses are otherwise well pneumatized. No mastoid effusion. Middle ear cavities are clear. Calvarium intact. IMPRESSION: 1. Large acute left MCA territory infarct with resolving associated hemorrhage. Similar local mass effect without midline shift. 2. No new intracranial process. Electronically Signed   By: Jeannine Boga M.D.   On: 04/13/2015 05:47   Ct Head Wo Contrast  04/12/2015  CLINICAL DATA:  Followup hemorrhage. History of hypertension, diabetes, breast cancer. Status post LEFT MCA stroke and occlusion, embolectomy and stent placement. EXAM: CT HEAD WITHOUT CONTRAST TECHNIQUE: Contiguous axial images were obtained from the base of the skull through the vertex without intravenous contrast. COMPARISON:  CT head April 11, 2015 FINDINGS: Wedge-like hypodensity LEFT frontotemporal parietal lobes, LEFT anterior basal ganglia. Patchy areas of hemorrhage in LEFT periinsular parenchyma. Local sulcal effacement, no significant midline shift. No abnormal extra-axial fluid  collections. Resolution of pneumocephalus. Mildly dense intracranial vessels, particularly the RIGHT MCA. Basal cisterns are patent. Mild calcific atherosclerosis  of the carotid siphons. Trace ethmoid mucosal thickening without paranasal sinus air-fluid levels. The imaged mastoid air cells are well aerated. No skull fracture. Ocular globes and orbital contents are unremarkable. IMPRESSION: Acute large LEFT MCA territory infarct with similar patchy areas of hemorrhage. Local mass effect without midline shift. Mildly dense intracranial vessels most compatible with residual contrast. Electronically Signed   By: Elon Alas M.D.   On: 04/12/2015 06:37   Dg Chest Port 1 View  04/13/2015  CLINICAL DATA:  CVA. EXAM: PORTABLE CHEST 1 VIEW COMPARISON:  04/12/2015. FINDINGS: Endotracheal tube, NG tube, right IJ line stable position. Mediastinum and hilar structures normal. Heart size normal. No focal infiltrate. No pleural effusion or pneumothorax. No acute bony abnormality . IMPRESSION: 1. Lines and tubes in stable position. 2. Low lung volumes with mild basilar atelectasis. Electronically Signed   By: [place]   On: 04/13/2015 08:03   Dg Chest Port 1 View  04/12/2015  CLINICAL DATA:  Central line placement.  Acute cerebral infarct. EXAM: PORTABLE CHEST 1 VIEW COMPARISON:  09/12/2013 FINDINGS: Endotracheal tube, right jugular central venous catheter, and nasogastric tube are seen in appropriate position. Both lungs are clear. No evidence of pneumothorax or pleural effusion. Heart size is within normal limits. Surgical clips again seen within the left axillary region. IMPRESSION: Support lines and tubes in appropriate position. No evidence of pneumothorax or other acute findings. Electronically Signed   By: Earle Gell M.D.   On: 04/12/2015 10:42   Dg Abd Portable 1v  04/12/2015  CLINICAL DATA:  Nasogastric tube placement. EXAM: PORTABLE ABDOMEN - 1 VIEW COMPARISON:  None. FINDINGS: A nasogastric  tube is seen with the tip overlying the distal stomach. No evidence of dilated bowel loops. IMPRESSION: Nasogastric tube tip overlies the distal stomach. Electronically Signed   By: Earle Gell M.D.   On: 04/12/2015 10:45    ROS: Per SON  General:no colds or fevers,  weight decrease from 206 to 187 in a year Skin:no rashes or ulcers HEENT:no blurred vision, no congestion that family is aware of. CV:see HPI- no syncope, occ chest pain, seen in ER last year neg. troponin PUL:see HPI no SOB GI:no diarrhea constipation or melena, no indigestion that family is aware of Neuro:no syncope, no lightheadedness Endo:+ diabetes, no thyroid disease That is all family could provide   Blood pressure 141/51, pulse 44, temperature 99.5 F (37.5 C), temperature source Core (Comment), resp. rate 14, height _0  (1.676 m), weight 187 lb 9.8 oz (85.1 kg), SpO2 100 %.  Wt Readings from Last 3 Encounters:  04/13/15 187 lb 9.8 oz (85.1 kg)  02/21/14 206 lb (93.441 kg)  11/08/12 233 lb 4.8 oz (105.824 kg)    PE: General:sedated, on vent, opens eyes when name called Skin:Warm and dry, brisk capillary refill HEENT:normocephalic,  mucus membranes moist Neck:supple, no JVD on Lt IJ in Rt  Heart:S1S2 RRR without murmur, gallup, rub or click Lungs: without rales, + rhonchi, no wheezes UDJ:SHFW, non tender, + BS, do not palpate liver spleen or masses Ext:no lower ext edema, 2+ pedal pulses, 2+ radial pulses Neuro:sedated, opens eyes with name called.     Assessment/Plan Active Problems:   CVA (cerebral infarction)   Stroke (cerebrum) (HCC)   Cerebrovascular accident (CVA) due to occlusion of left middle cerebral artery ([place])   Cerebrovascular accident (CVA) due to thrombosis of precerebral artery ([place])   Encounter for central line placement   Encounter for feeding tube placement  Bradycardia HR to  20s, 4 sec pauses. May be related to stroke, but with increased episodes of brady may need temp pacer.     She is on amlodipine and clonidine. Now on fentanyl drip for sedation, -concern previous propofol was causing Loletha Grayer.  No labetalol since the 19th.  Has had chest pain at home. EKGs are stable.    Hyperlipidemia on pravachol.     [place]  Nurse Practitioner Certified [place] Pager [phone] or after 5pm or weekends call [phone] 04/13/2015, 2:53 PM  Patient was seen with Cecilie Kicks, NP-C. The patient has developed intermittent episodes of severe bradycardia and asystole.  She does not have any prior history of bradycardia.  Her 12-lead EKG earlier today was essentially normal including normal PR interval.  She is now having episodes of sudden severe sinus bradycardia leading to periods of asystole. She is not on any drugs which would be causing this. She will need a temporary pacemaker.  I spoke by phone with the patient's husband who is in agreement with the planned temporary pacemaker insertion and gave his consent.  The patient will be transported urgently to the cardiac catheterization lab where Dr. [place] will place the temporary pacemaker.  The strips are visible in epic.  Serum potassium this morning was low at 3.2.  We will recheck tonight

## 2015-04-13 NOTE — Progress Notes (Signed)
STROKE TEAM PROGRESS NOTE   SUBJECTIVE (INTERVAL HISTORY) Kerry [name] this am in the 30s. Low all night per RN. Propofol turned off with an fast increase up to 78. Remained elevated. Son as well as parents arrived to bedside. Family has noticed some responsiveness and inability to follow commands from the patient on the left side and would however want to rescind DO NOT RESUSCITATE and make her a full code   OBJECTIVE Temp:  [98.4 F (36.9 C)-100.8 F (38.2 C)] 99.5 F (37.5 C) (10/21 0900) Pulse Rate:  [44-86] 70 (10/21 0900) Cardiac Rhythm:  [-] Sinus bradycardia (10/21 0800) Resp:  [14-24] 18 (10/21 0900) BP: (124-180)/(44-146) 133/82 mmHg (10/21 0900) SpO2:  [100 %] 100 % (10/21 0900) FiO2 (%):  [40 %-100 %] 40 % (10/21 0755) Weight:  [85.1 kg (187 lb 9.8 oz)] 85.1 kg (187 lb 9.8 oz) (10/21 0500)  CBC:   Recent Labs Lab 04/11/15 0905 04/11/15 0909 04/13/15 0723  WBC 9.4  --  10.6*  NEUTROABS 6.4  --  PENDING  HGB 15.2* 16.7* 12.0  HCT 44.8 49.0* 36.4  MCV 86.3  --  90.3  PLT 192  --  PLATELET CLUMPS NOTED ON SMEAR, COUNT APPEARS ADEQUATE    Basic Metabolic Panel:   Recent Labs Lab 04/11/15 0905 04/11/15 0909  04/12/15 2139 04/13/15 0240  NA 135 138  < > 144 146*  K 4.1 4.0  --   --  3.2*  CL 102 102  --   --  117*  CO2 25  --   --   --  24  GLUCOSE 325* 338*  --   --  223*  BUN 9 10  --   --  11  CREATININE 0.62 0.50  --   --  0.47  CALCIUM 9.1  --   --   --  8.1*  < > = values in this interval not displayed.  Lipid Panel:     Component Value Date/Time   CHOL 208* 04/12/2015 0315   TRIG 231* 04/12/2015 0315   HDL 29* 04/12/2015 0315   CHOLHDL 7.2 04/12/2015 0315   VLDL 46* 04/12/2015 0315   LDLCALC 133* 04/12/2015 0315   HgbA1c: No results found for: HGBA1C Urine Drug Screen: No results found for: LABOPIA, COCAINSCRNUR, LABBENZ, AMPHETMU, THCU, LABBARB    IMAGING  Ct Head Wo Contrast  04/12/2105  1. Large acute left MCA territory infarct with  resolving associated hemorrhage. Similar local mass effect without midline shift. 2. No new intracranial process.  04/12/2015  Acute large LEFT MCA territory infarct with similar patchy areas of hemorrhage. Local mass effect without midline shift. Mildly dense intracranial vessels most compatible with residual contrast.   04/11/2015  Acute hemorrhage in the left temporal lobe. This may be within the sylvian fissure rather than the insular cortex. There are small gas bubbles in the area of hemorrhage which may be extra vascular gas in the subarachnoid space. This is likely related to the procedure and micro perforation of a vessel. Acute infarct left temporal lobe and possibly left basal ganglia. There is local edema without midline shift   04/11/2015  Slightly more conspicuous loss of gray-white differentiation affecting the left temporal lobe and frontoparietal regions in the left basal ganglia consistent with acute left MCA infarction. Aspects score difficult because of some motion, but estimated at 5 or 6.   04/11/2015  CT findings consistent with early left MCA infarct with a dense left MCA sign. No mass effect or  hemorrhage.   2D Echocardiogram   - Left ventricle: The cavity size was normal. Systolic function wasnormal. The estimated ejection fraction was in the range of 55%to 60%. Wall motion was normal; there were no regional wallmotion abnormalities. - Aortic valve: Valve area (VTI): 2.24 cm^2. Valve area (Vmax): 2cm^2. Valve area (Vmean): 2.04 cm^2. - Atrial septum: No defect or patent foramen ovale was identified.    PHYSICAL EXAM Middle-aged obese Caucasian lady who is intubated and sedated. . Afebrile. Head is nontraumatic. Neck is supple without bruit.    Cardiac exam no murmur or gallop. Lungs are clear to auscultation. Distal pulses are well felt. Neurological Exam :  stuporose with eyes closed. Opens eyes partially to sternal rub. Follows occasional commands on the left side.  Left gaze deviation. Right pupil 2 mm ,left 4 mm both sluggishly reactive. Corneal reflexes are present. Fundi were not visualized. Does not blink to threat bilaterally. Right lower facial weakness. Tongue midline. Weak cough and gag. Most left upper and lower extremity; purposefully to painful stimuli. Minimum right UE movement to pain.Right lower extremity withdraws modestly to painful stimuli. Right plantar equivocal left downgoing. Withdraws to painful stimuli more on the left than the right. Gait cannot be tested   ASSESSMENT/PLAN Kerry [name] is a 59 y.o. female with history of diabetes mellitus, hypertension, hyperlipidemia and fibromyalgia presenting with new onset speech difficulty and right hemiparesis. She received IV t-PA (total 50 mg, 19 mg not received) due to elevated BP. 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 Post tPA/procedure symptomatic ICH with cytotoxic cerebral edema  Resultant  Respiratory failure, comatose state, right hemiparesis, dysphagia, cerebral edema, brain herniation  CT this am confirms patch post tPA hemorrhage  Repeat CT head still with large L MCA infarct, hemorrhage resolving, edema, no midline shift  2D Echo  EF 55-60%, no source of embolus  LDL 133  HgbA1c pending   SCDs for VTE prophylaxis Diet NPO time specified  No antithrombotic prior to admission.now on aspirin suppository. Will change to per tube  Ongoing aggressive stroke risk factor management  Continue ICU level care.   Dr. [name] discussed diagnosis, prognosis,  treatment options and plan of care with son. Her stroke is disabling. She may or may not survive. If she does survive,  she will be disabled and need 24h care. He understands the severity of her illness. She is a DNR but is full aggressive treatment other than CPR at this time. She is at risk for neuroworsening from cerebral edema, large stroke and medical co-morbidities. Stroke MD coverage for weekend and next week discussed with son. .     Therapy recommendations:  pending   Disposition:  pending   Cytotoxic cerebral edema Induced hypernatremia  CCM to place central line  Mannitol 25 gm now  Started 3% saline 04/13/2015  Na 135 prior to, now 144. Will increase to 100/h  Carotid stenosis  L ICA occlusion  S/p acute stent placement (Dr. Arizona Constable at [facility])  Start on aspirin 300 mg suppository  Acute respiratory Failure  Intubated for procedure  Unable to protect airway  CCM managing  Malignant Hypertension  BP 179/82 on arrival, as high as 213/97  On cardene drip during the  night  BP now 138/45  SBP goal < 160 given hemorrhage  Hyperlipidemia  Home meds:  pravachol 40  LDL 133, goal < 70  Resume in hospital once tube placed/pt stable  Plan continue statin at discharge  Bradycardia  Down to the 30s both pre-IR and post CL placement  BB not recommended  HR still low this am - propofol off with an increase up to the 70s. Would avoid propofol. Dr. [name] to address with Dr. [name]  Diabetes type II  HgbA1c pending , goal < 7.0  On lantus 30 u daily PTA, diabetic RN recommends 50% home lantus dose. Ordered.  Other Stroke Risk Factors  Cigarette smoker, quit smoking  ETOH use  Obesity, Body mass index is 30.3 kg/(m^2).   Coronary artery disease  L femoral bypass  Other Active Problems  Hx breast cancer  AKI - Cr 0.5  Hospital day # 2  BIBY,SHARON  [place] for Pager information 04/13/2015 9:23 AM  I have personally examined this patient, reviewed notes, independently viewed imaging studies, participated in medical decision  making and plan of care. I have made any additions or clarifications directly to the above note. Agree with note above. The patient is critically CT scan shows stable appearance of cytotoxic edema and brain herniation and hemorrhage but she is having recurrent episodes of transient bradycardia which is concerning and hence we'll get cardiology consult for temporary pacemaker. Continue hypertonic saline with sodium goal 150-155. I had a long discussion with the patient's son at the bedside and answered questions. Family has now decided to make patient full code and be aggressive This patient is critically ill and at significant risk of neurological worsening, death and care requires constant monitoring of vital signs, hemodynamics,respiratory and cardiac monitoring, extensive review of multiple databases, frequent neurological assessment, discussion with family, other specialists and medical decision making of high complexity.I have made any additions or clarifications directly to the above note.This critical care time does not reflect procedure time, or teaching time or supervisory time of PA/NP/Med Resident etc but could involve care discussion time.  I spent 50 minutes of neurocritical care time  in the care of  this patient.     Antony Contras, MD Medical Director [facility] Pager: [phone] 04/13/2015 4:53 PM    To contact Stroke Continuity provider, please refer to [email or web address] After hours, contact General Neurology

## 2015-04-14 ENCOUNTER — Encounter (HOSPITAL_COMMUNITY): Payer: Self-pay | Admitting: *Deleted

## 2015-04-14 ENCOUNTER — Inpatient Hospital Stay (HOSPITAL_COMMUNITY): Payer: 59

## 2015-04-14 DIAGNOSIS — R001 Bradycardia, unspecified: Secondary | ICD-10-CM | POA: Insufficient documentation

## 2015-04-14 DIAGNOSIS — G936 Cerebral edema: Secondary | ICD-10-CM | POA: Insufficient documentation

## 2015-04-14 DIAGNOSIS — J9600 Acute respiratory failure, unspecified whether with hypoxia or hypercapnia: Secondary | ICD-10-CM

## 2015-04-14 DIAGNOSIS — I469 Cardiac arrest, cause unspecified: Secondary | ICD-10-CM | POA: Insufficient documentation

## 2015-04-14 DIAGNOSIS — I619 Nontraumatic intracerebral hemorrhage, unspecified: Secondary | ICD-10-CM | POA: Insufficient documentation

## 2015-04-14 LAB — BASIC METABOLIC PANEL
Anion gap: 6 (ref 5–15)
Anion gap: 5 (ref 5–15)
BUN: 14 mg/dL (ref 6–20)
BUN: 20 mg/dL (ref 6–20)
CHLORIDE: 120 mmol/L — AB (ref 101–111)
CHLORIDE: 119 mmol/L — AB (ref 101–111)
CO2: 26 mmol/L (ref 22–32)
CO2: 27 mmol/L (ref 22–32)
Calcium: 8.5 mg/dL — ABNORMAL LOW (ref 8.9–10.3)
Calcium: 8.6 mg/dL — ABNORMAL LOW (ref 8.9–10.3)
Creatinine, Ser: 0.51 mg/dL (ref 0.44–1.00)
Creatinine, Ser: 0.44 mg/dL (ref 0.44–1.00)
GFR calc Af Amer: >60 mL/min (ref >60)
GFR calc Af Amer: >60 mL/min (ref >60)
GFR calc non Af Amer: >60 mL/min (ref >60)
GFR calc non Af Amer: >60 mL/min (ref >60)
GLUCOSE: 225 mg/dL — AB (ref 65–99)
GLUCOSE: 285 mg/dL — AB (ref 65–99)
POTASSIUM: 2.4 mmol/L — AB (ref 3.5–5.1)
POTASSIUM: 3 mmol/L — AB (ref 3.5–5.1)
Sodium: 152 mmol/L — ABNORMAL HIGH (ref 135–145)
Sodium: 151 mmol/L — ABNORMAL HIGH (ref 135–145)

## 2015-04-14 LAB — SODIUM
Sodium: 155 mmol/L — ABNORMAL HIGH (ref 135–145)
Sodium: 153 mmol/L — ABNORMAL HIGH (ref 135–145)

## 2015-04-14 LAB — CBC
HEMATOCRIT: 36.1 % (ref 36.0–46.0)
Hemoglobin: 11.5 g/dL — ABNORMAL LOW (ref 12.0–15.0)
MCH: 29.2 pg (ref 26.0–34.0)
MCHC: 31.9 g/dL (ref 30.0–36.0)
MCV: 91.6 fL (ref 78.0–100.0)
Platelets: 150 10*3/uL (ref 150–400)
RBC: 3.94 MIL/uL (ref 3.87–5.11)
RDW: 14.2 % (ref 11.5–15.5)
WBC: 10.9 10*3/uL — AB (ref 4.0–10.5)

## 2015-04-14 LAB — URINALYSIS W MICROSCOPIC (NOT AT ARMC)
BILIRUBIN URINE: NEGATIVE
Glucose, UA: >1000 mg/dL — AB
Hgb urine dipstick: NEGATIVE
KETONES UR: 15 mg/dL — AB
Nitrite: POSITIVE — AB
PROTEIN: NEGATIVE mg/dL
Specific Gravity, Urine: 1.026 (ref 1.005–1.030)
Urobilinogen, UA: 1 mg/dL (ref 0.0–1.0)
pH: 6 (ref 5.0–8.0)

## 2015-04-14 LAB — GLUCOSE, CAPILLARY
GLUCOSE-CAPILLARY: 199 mg/dL — AB (ref 65–99)
Glucose-Capillary: 187 mg/dL — ABNORMAL HIGH (ref 65–99)
Glucose-Capillary: 270 mg/dL — ABNORMAL HIGH (ref 65–99)
Glucose-Capillary: 212 mg/dL — ABNORMAL HIGH (ref 65–99)
Glucose-Capillary: 149 mg/dL — ABNORMAL HIGH (ref 65–99)
Glucose-Capillary: 285 mg/dL — ABNORMAL HIGH (ref 65–99)

## 2015-04-14 LAB — MAGNESIUM: Magnesium: 1.9 mg/dL (ref 1.7–2.4)

## 2015-04-14 MED ORDER — PNEUMOCOCCAL VAC POLYVALENT 25 MCG/0.5ML IJ INJ
0.5000 mL | INJECTION | INTRAMUSCULAR | Status: AC
  Administered 2015-04-15: 0.5 mL via INTRAMUSCULAR
  Filled 2015-04-14: qty 0.5

## 2015-04-14 MED ORDER — INFLUENZA VAC SPLIT QUAD 0.5 ML IM SUSY
0.5000 mL | PREFILLED_SYRINGE | INTRAMUSCULAR | Status: AC
  Administered 2015-04-15: 0.5 mL via INTRAMUSCULAR
  Filled 2015-04-14: qty 0.5

## 2015-04-14 MED ORDER — POTASSIUM CHLORIDE 20 MEQ/15ML (10%) PO SOLN
20.0000 meq | Freq: Once | ORAL | Status: AC
  Administered 2015-04-14: 20 meq
  Filled 2015-04-14: qty 15

## 2015-04-14 MED ORDER — POTASSIUM CHLORIDE 10 MEQ/50ML IV SOLN
10.0000 meq | INTRAVENOUS | Status: AC
  Administered 2015-04-14 (×6): 10 meq via INTRAVENOUS
  Filled 2015-04-14 (×6): qty 50

## 2015-04-14 NOTE — Progress Notes (Signed)
PT Cancellation Note  Patient Details Name: Kerry [name] MRN: [id number] DOB: Jan 01, 1956   Cancelled Treatment:    Reason Eval/Treat Not Completed: Patient not medically ready. K+ is currently 2.4.  The patient is lethargic, difficult to arouse, not following commands, and intubated. Will return to complete PT evaluation once pt is medically appropriate.    Joslyn Hy PT, DPT [phone] Pager: [phone] 04/14/2015, 9:04 AM

## 2015-04-14 NOTE — Plan of Care (Signed)
Problem: tPA Day Progression Outcomes-Only if tPA administered Goal: Post tPA no S/S of hemorrhage elsewhere Outcome: Not Met (add Reason) Please refer to CT head

## 2015-04-14 NOTE — Progress Notes (Signed)
TELEMETRY: Reviewed telemetry pt in NSR with intermittent pacing at rate 50 bpm. : Filed Vitals:   04/14/15 0735 04/14/15 0800 04/14/15 0900 04/14/15 1000  BP: 141/60 177/80 184/62 177/70  Pulse: 57 59 72 61  Temp:  100 F (37.8 C) 99.9 F (37.7 C) 100 F (37.8 C)  TempSrc:      Resp: 14 16 15 15   Height:      Weight:      SpO2: 100% 100% 100% 100%    Intake/Output Summary (Last 24 hours) at 04/14/15 1046 Last data filed at 04/14/15 1012  Gross per 24 hour  Intake 2394.16 ml  Output   3485 ml  Net -1090.84 ml   Filed Weights   04/11/15 1500 04/13/15 0500 04/14/15 0500  Weight: 85.1 kg (187 lb 9.8 oz) 92.7 kg (204 lb 5.9 oz) 94.3 kg (207 lb 14.3 oz)    Subjective On vent. Unresponsive.   Marland Kitchen amLODipine  10 mg Per Tube Daily  . antiseptic oral rinse  7 mL Mouth Rinse Q2H  . aspirin  325 mg Per Tube Daily  . chlorhexidine gluconate  15 mL Mouth Rinse BID  . cloNIDine  0.1 mg Oral BID  . feeding supplement (PRO-STAT SUGAR FREE 64)  60 mL Per Tube BID  . feeding supplement (VITAL HIGH PROTEIN)  1,000 mL Per Tube Q24H  . [START ON 04/15/2015] Influenza vac split quadrivalent PF  0.5 mL Intramuscular Tomorrow-1000  . insulin aspart  0-20 Units Subcutaneous 6 times per day  . insulin glargine  15 Units Subcutaneous QHS  . pantoprazole sodium  40 mg Per Tube Q24H  . [START ON 04/15/2015] pneumococcal 23 valent vaccine  0.5 mL Intramuscular Tomorrow-1000  . potassium chloride  10 mEq Intravenous Q1 Hr x 6  . pravastatin  40 mg Per Tube Daily   . sodium chloride 10 mL/hr at 04/13/15 2200  . fentaNYL infusion INTRAVENOUS 25 mcg/hr (04/14/15 0700)  . niCARDipine Stopped (04/14/15 0031)  . sodium chloride (hypertonic) Stopped (04/13/15 1917)    LABS: Basic Metabolic Panel:  Recent Labs  04/13/15 1800  04/14/15 0515 04/14/15 0908  NA 157*  < > 152* 153*  K 2.8*  --  2.4*  --   CL 127*  --  120*  --   CO2 24  --  26  --   GLUCOSE 275*  --  225*  --   BUN 16  --  14   --   CREATININE 0.48  --  0.51  --   CALCIUM 8.6*  --  8.5*  --   MG  --   --  1.9  --   < > = values in this interval not displayed. Liver Function Tests:  Recent Labs  04/13/15 0240  AST 21  ALT 22  ALKPHOS 52  BILITOT 0.6  PROT 5.2*  ALBUMIN 2.6*   No results for input(s): LIPASE, AMYLASE in the last 72 hours. CBC:  Recent Labs  04/13/15 0723 04/14/15 0515  WBC 10.6* 10.9*  NEUTROABS 7.7  --   HGB 12.0 11.5*  HCT 36.4 36.1  MCV 90.3 91.6  PLT PLATELET CLUMPS NOTED ON SMEAR, COUNT APPEARS ADEQUATE 150   Cardiac Enzymes: No results for input(s): CKTOTAL, CKMB, CKMBINDEX, TROPONINI in the last 72 hours. BNP: No results for input(s): PROBNP in the last 72 hours. D-Dimer: No results for input(s): DDIMER in the last 72 hours. Hemoglobin A1C:  Recent Labs  04/12/15 0315  HGBA1C 11.4*  Fasting Lipid Panel:  Recent Labs  04/12/15 0315  CHOL 208*  HDL 29*  LDLCALC 133*  TRIG 231*  CHOLHDL 7.2   Thyroid Function Tests: No results for input(s): TSH, T4TOTAL, T3FREE, THYROIDAB in the last 72 hours.  Invalid input(s): FREET3   Radiology/Studies:  Ct Head Wo Contrast  04/13/2015  CLINICAL DATA:  Follow-up examination for intracranial hemorrhage EXAM: CT HEAD WITHOUT CONTRAST TECHNIQUE: Contiguous axial images were obtained from the base of the skull through the vertex without intravenous contrast. COMPARISON:  Prior study from 04/12/2015 as well as earlier exams. FINDINGS: Extensive hypodensity consistent with cytotoxic edema again seen throughout the left MCA territory, consistent with known left MCA territory infarct. Hypodensity is somewhat more well defined as compared to prior study. Involvement of the left caudate and anterior left lentiform nucleus. Previously seen patchy areas of hemorrhage in the left perinsular region continue to resolved, with only faint hyperdensity now seen at this location. Persistent density within the distal left MCA branches in  the left sylvian fissure. No new hemorrhage. Similar local mass effect with sulcal effacement and partial effacement of the left lateral ventricle. No significant midline shift. No new infarct. No extra-axial fluid collection. No hydrocephalus. Basilar cisterns are patent. Scalp soft tissues demonstrate no acute abnormality. Globes and orbits are stable. Scattered mucosal thickening within the ethmoidal air cells. Paranasal sinuses are otherwise well pneumatized. No mastoid effusion. Middle ear cavities are clear. Calvarium intact. IMPRESSION: 1. Large acute left MCA territory infarct with resolving associated hemorrhage. Similar local mass effect without midline shift. 2. No new intracranial process. Electronically Signed   By: Jeannine Boga M.D.   On: 04/13/2015 05:47   Dg Chest Port 1 View  04/14/2015  CLINICAL DATA:  Compromised airway. EXAM: PORTABLE CHEST 1 VIEW COMPARISON:  04/13/2015 FINDINGS: Endotracheal tube terminates at the level of the clavicular heads. Right jugular central venous catheter terminates over the lower SVC. Enteric tube courses into the left upper abdomen with tip not imaged. There has been interval placement of a transvenous pacemaker lead from an inferior approach with tip projecting in the expected region of the right ventricular apex, incompletely evaluated on this single AP projection. Cardiac silhouette is upper limits of normal in size. The lungs are better inflated than on the prior study. No confluent airspace opacity, overt edema, pleural effusion, or pneumothorax is identified. IMPRESSION: 1. Support devices as above. 2. Improved lung volumes.  No evidence of pneumonia or edema. Electronically Signed   By: Logan Bores M.D.   On: 04/14/2015 08:44   Dg Chest Port 1 View  04/13/2015  CLINICAL DATA:  CVA. EXAM: PORTABLE CHEST 1 VIEW COMPARISON:  04/12/2015. FINDINGS: Endotracheal tube, NG tube, right IJ line stable position. Mediastinum and hilar structures normal.  Heart size normal. No focal infiltrate. No pleural effusion or pneumothorax. No acute bony abnormality . IMPRESSION: 1. Lines and tubes in stable position. 2. Low lung volumes with mild basilar atelectasis. Electronically Signed   By: [name]  Register   On: 04/13/2015 08:03    PHYSICAL EXAM PE: General:sedated, on vent, opens eyes when name called Skin:Warm and dry, brisk capillary refill HEENT:normocephalic, mucus membranes moist Neck:supple, no JVD on Lt IJ in Rt  Heart:S1S2 RRR without murmur, gallup, rub or click Lungs: without rales, + rhonchi, no wheezes PRF:FMBW, non tender, + BS, do not palpate liver spleen or masses Ext:no lower ext edema, 2+ pedal pulses, 2+ radial pulses Neuro:sedated, opens eyes with name called.  ASSESSMENT AND PLAN: 1. Marked sinus bradycardia with pauses > 4 seconds/ asystole. Most likely related to acute CNS event with CVA and ICH post TPA. Hopefully bradycardia will improve with improvement in CNS status but for now still requiring temporary pacing. Need to avoid beta blockers or other rate slowing meds (still getting prn labetalol for BP) 2. CVA s/p TPA and mechanical embolectomy with ICH 3. Acute respiratory failure on vent. 4. Hypokalemia. Need to replete >4.0  Temp pacer assessed and still has excellent thresholds down to 0.5 mA.  Present on Admission:  . CVA (cerebral infarction) . Stroke (cerebrum) ([place])  Signed, Latreece Mochizuki [place], [place] 04/14/2015 10:46 AM

## 2015-04-14 NOTE — Progress Notes (Signed)
Wasted 155 mL of Fentanyl in sink.  Witnessed by Roselyn Reef, Therapist, sports.

## 2015-04-14 NOTE — Progress Notes (Signed)
[facility] Progress Note Patient Name: Kerry [name] DOB: [birth year] MRN: [id number]   Date of Service  04/14/2015  HPI/Events of Note  Multiple issues: 1. K+ = 2.4 and Creatinine = 0.51 and 2. Urine looks dirty.   eICU Interventions  Will order: 1. Replete K+. 2. BMP at 2 PM. 3. UA now.      Intervention Category Intermediate Interventions: Electrolyte abnormality - evaluation and management;Infection - evaluation and management  Coraima Tibbs [place] 04/14/2015, 6:20 AM

## 2015-04-14 NOTE — Progress Notes (Signed)
STROKE TEAM PROGRESS NOTE   SUBJECTIVE (INTERVAL HISTORY) The patient is lethargic, difficult to arouse. The family is not at bedside. The patient had a pacemaker placed yesterday, heart rates are being maintained overnight. Potassium is low, the patient is getting supplementation. Family has decided to make the patient full code, orders placed.  OBJECTIVE Temp:  [99.3 F (37.4 C)-101.1 F (38.4 C)] 100 F (37.8 C) (10/22 0800) Pulse Rate:  [0-133] 59 (10/22 0800) Cardiac Rhythm:  [-] Sinus bradycardia (10/21 2000) Resp:  [0-46] 16 (10/22 0800) BP: (103-188)/(41-84) 177/80 mmHg (10/22 0800) SpO2:  [0 %-100 %] 100 % (10/22 0800) FiO2 (%):  [40 %-100 %] 40 % (10/22 0800) Weight:  [207 lb 14.3 oz (94.3 kg)] 207 lb 14.3 oz (94.3 kg) (10/22 0500)  CBC:   Recent Labs Lab 04/11/15 0905  04/13/15 0723 04/14/15 0515  WBC 9.4  --  10.6* 10.9*  NEUTROABS 6.4  --  7.7  --   HGB 15.2*  < > 12.0 11.5*  HCT 44.8  < > 36.4 36.1  MCV 86.3  --  90.3 91.6  PLT 192  --  PLATELET CLUMPS NOTED ON SMEAR, COUNT APPEARS ADEQUATE 150  < > = values in this interval not displayed.  Basic Metabolic Panel:   Recent Labs Lab 04/13/15 1800 04/13/15 2340 04/14/15 0515  NA 157* 155* 152*  K 2.8*  --  2.4*  CL 127*  --  120*  CO2 24  --  26  GLUCOSE 275*  --  225*  BUN 16  --  14  CREATININE 0.48  --  0.51  CALCIUM 8.6*  --  8.5*  MG  --   --  1.9    Lipid Panel:     Component Value Date/Time   CHOL 208* 04/12/2015 0315   TRIG 231* 04/12/2015 0315   HDL 29* 04/12/2015 0315   CHOLHDL 7.2 04/12/2015 0315   VLDL 46* 04/12/2015 0315   LDLCALC 133* 04/12/2015 0315   HgbA1c:  Lab Results  Component Value Date   HGBA1C 11.4* 04/12/2015   Urine Drug Screen: No results found for: LABOPIA, COCAINSCRNUR, LABBENZ, AMPHETMU, THCU, LABBARB    IMAGING  Ct Head Wo Contrast  04/12/2105  1. Large acute left MCA territory infarct with resolving associated hemorrhage. Similar local mass effect  without midline shift. 2. No new intracranial process.  04/12/2015  Acute large LEFT MCA territory infarct with similar patchy areas of hemorrhage. Local mass effect without midline shift. Mildly dense intracranial vessels most compatible with residual contrast.   04/11/2015  Acute hemorrhage in the left temporal lobe. This may be within the sylvian fissure rather than the insular cortex. There are small gas bubbles in the area of hemorrhage which may be extra vascular gas in the subarachnoid space. This is likely related to the procedure and micro perforation of a vessel. Acute infarct left temporal lobe and possibly left basal ganglia. There is local edema without midline shift   04/11/2015  Slightly more conspicuous loss of gray-white differentiation affecting the left temporal lobe and frontoparietal regions in the left basal ganglia consistent with acute left MCA infarction. Aspects score difficult because of some motion, but estimated at 5 or 6.   04/11/2015  CT findings consistent with early left MCA infarct with a dense left MCA sign. No mass effect or hemorrhage.   2D Echocardiogram   - Left ventricle: The cavity size was normal. Systolic function wasnormal. The estimated ejection fraction was in the range  of 55%to 60%. Wall motion was normal; there were no regional wallmotion abnormalities. - Aortic valve: Valve area (VTI): 2.24 cm^2. Valve area (Vmax): 2cm^2. Valve area (Vmean): 2.04 cm^2. - Atrial septum: No defect or patent foramen ovale was identified.    Physical Exam  General: The patient is lethargic, difficult to arouse, intubated.  Respiratory: Clear lung fields  Cardiovascular: Regular rate and rhythm  Abdomen: Soft, nontender, positive bowel sounds  Skin: 1+ edema in the legs noted..   Neurologic Exam  Mental status: The patient is lethargic, difficult to arouse. The patient is intubated. She will not follow commands.  Cranial nerves: There is a slight  depression of the right nasolabial fold. Pupils are now equal, 2-3 mm. Trace reactive. The patient is not blink to threat from either side.  Motor: The patient will move the left arm and leg with painful stimulation, subtle movement also seen of the right leg. With sternal rub, there is some extension of the right arm.  Sensory examination: Minimal response at this time is seen with the pain stimulation on all 4 extremities.  Coordination: The patient is unable to cooperate for cerebellar testing..  Gait and station: The patient is bedridden, comatose, unable to ambulate.  Reflexes: Deep tendon reflexes are symmetric. Upgoing toe noted on the right.    ASSESSMENT/PLAN Kerry [name] is a 59 y.o. female with history of diabetes mellitus, hypertension, hyperlipidemia and fibromyalgia presenting with new onset speech difficulty and right hemiparesis. She received IV t-PA (total 50 mg, 19 mg not received) due to elevated BP. 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 Post tPA/procedure symptomatic ICH with cytotoxic cerebral edema  Resultant  Respiratory failure, comatose state, right hemiparesis, dysphagia, cerebral edema, brain herniation  CT 10/21 confirms patch post tPA hemorrhage  Repeat CT head still with large L MCA infarct, hemorrhage resolving, edema, no midline shift  2D Echo  EF 55-60%, no source of embolus  LDL 133  HgbA1c pending   SCDs for VTE prophylaxis Diet NPO time specified  No antithrombotic prior to admission.now on aspirin suppository. Will change to per tube  Ongoing aggressive stroke risk factor management  Continue ICU level care.   Dr. [name] discussed  diagnosis, prognosis,  treatment options and plan of care with son. Her stroke is disabling. She may or may not survive. If she does survive, she will be disabled and need 24h care. He understands the severity of her illness. Full code at this time. She is at risk for neuroworsening from cerebral edema, large stroke and medical co-morbidities. Stroke MD coverage for weekend and next week discussed with son. .     Therapy recommendations:  pending   Disposition:  pending   Cytotoxic cerebral edema Induced hypernatremia  CCM following  Started 3% saline 04/13/2015  Na 135 prior to, now 152.   Carotid stenosis  L ICA occlusion  S/p acute stent placement (Dr. Arizona Constable at [facility])  Start on aspirin 300 mg suppository  Acute respiratory Failure  Intubated for procedure  Unable to protect airway  CCM managing  Malignant Hypertension  BP 179/82 on arrival, as high as 213/97  On cardene drip during the night  BP now 177/80  SBP goal <  160 given hemorrhage  Hyperlipidemia  Home meds:  pravachol 40  LDL 133, goal < 70  Resume in hospital once tube placed/pt stable  Plan continue statin at discharge  Bradycardia  Down to the 30s both pre-IR and post CL placement  BB not recommended  Cardiology has seen, pacemaker placed  Diabetes type II  HgbA1c 11.4 , goal < 7.0  On lantus 30 u daily PTA, diabetic RN recommends 50% home lantus dose. Ordered.  Other Stroke Risk Factors  Cigarette smoker, quit smoking  ETOH use  Obesity, Body mass index is 33.57 kg/(m^2).   Coronary artery disease  L femoral bypass  Other Active Problems  Hx breast cancer  AKI - Cr 0.5  Hospital day # [place]   Phone [phone]   The patient remains intubated, on hypertonic saline, cardiology is following, heart rates better with pacemaker. The patient is now a full code as per family. Potassium is low, will be monitored throughout the day. We'll follow neurologic  status over time.  Critical care is following.  [facility] See Amion for Pager information 04/14/2015 8:15 AM      04/14/2015 8:15 AM    To contact Stroke Continuity provider, please refer to [email or web address] After hours, contact General Neurology

## 2015-04-14 NOTE — Progress Notes (Signed)
CRITICAL VALUE ALERT  Critical value received:  K 2.4   Date of notification:  04/14/15  Time of notification:  06:20 AM  Critical value read back:Yes.    Nurse who received alert:  Joli Koob, RN.  MD notified (1st page): Som[place]  Time of first page:  6:20 AM   MD notified (2nd page):  Time of second page:  Responding MD:  Emmit Alexanders  Time MD responded:  6:20 AM

## 2015-04-14 NOTE — Progress Notes (Signed)
SLP Cancellation Note  Patient Details Name: Kerry [name] MRN: [id number] DOB: November 26, 1955   Cancelled treatment:       Reason Eval/Treat Not Completed: Medical issues which prohibited therapy. Intubated, will sign off, please reorder when ready.    Orine Goga, Katherene Ponto 04/14/2015, 7:31 AM

## 2015-04-14 NOTE — Progress Notes (Signed)
PULMONARY / CRITICAL CARE MEDICINE   Name: NAN MAYA MRN: [id number] DOB: 09-Feb-1956    ADMISSION DATE:  04/11/2015 CONSULTATION DATE:  04/11/2015  REFERRING MD : Neurology  CHIEF COMPLAINT:  Rt sided weakness  INITIAL PRESENTATION:  59 y/o female smoker presented with Rt sided weakness and difficulty with speech from LT MCA CVA s/p limited dosing of tPA and embolectomy with stent.  Remained on vent post-procedure and PCCM consulted.  STUDIES:  10/19 CT head >> Lt MCA infarct 10/20 ECHO >> EF 55 to 60%  SIGNIFICANT EVENTS: 10/19 Admit, limited tPA, embolectomy with stenting 10/20 start 3% NS 10/21 Bradycardia from propofol, fever  SUBJECTIVE: Pacing wires in place, paced at 50 bpm, sedate on vent  VITAL SIGNS: Temp:  [99.1 F (37.3 C)-101.3 F (38.5 C)] 99.7 F (37.6 C) (10/24 0900) Pulse Rate:  [49-110] 58 (10/24 0900) Resp:  [15-22] 18 (10/24 0900) BP: (98-211)/(42-134) 153/57 mmHg (10/24 0900) SpO2:  [99 %-100 %] 100 % (10/24 0900) FiO2 (%):  [40 %] 40 % (10/24 0738) Weight:  [94.6 kg (208 lb 8.9 oz)] 94.6 kg (208 lb 8.9 oz) (10/24 0500) HEMODYNAMICS: CVP:  [7 mmHg-16 mmHg] 12 mmHg VENTILATOR SETTINGS: Vent Mode:  [-] PRVC FiO2 (%):  [40 %] 40 % Set Rate:  [16 bmp] 16 bmp Vt Set:  [480 mL] 480 mL PEEP:  [5 cmH20] 5 cmH20 Pressure Support:  [8 cmH20] 8 cmH20 Plateau Pressure:  [12 cmH20-16 cmH20] 16 cmH20 INTAKE / OUTPUT:  Intake/Output Summary (Last 24 hours) at 04/16/15 1051 Last data filed at 04/16/15 0925  Gross per 24 hour  Intake   1703 ml  Output   1785 ml  Net    -82 ml    PHYSICAL EXAMINATION: General: obese female in NAD, sedated Neuro:  Moves Lt side HEENT:  ETT in place, MM pink/moist  Cardiovascular:  Regular, no murmur Lungs:  No wheeze Abdomen:  Soft, non tender Musculoskeletal:  No edema Skin:  No rashes  LABS:  CBC  Recent Labs Lab 04/13/15 0723 04/14/15 0515 04/15/15 0310  WBC 10.6* 10.9* 10.2  HGB 12.0 11.5*  11.1*  HCT 36.4 36.1 35.8*  PLT PLATELET CLUMPS NOTED ON SMEAR, COUNT APPEARS ADEQUATE 150 143*   Coag's  Recent Labs Lab 04/11/15 0905  APTT 24  INR 1.00   BMET  Recent Labs Lab 04/14/15 0515  04/14/15 1415  04/15/15 0310  04/15/15 1527 04/15/15 2125 04/16/15 0325  NA 152*  < > 151*  < > 154*  < > 157* 156* 158*  K 2.4*  --  3.0*  --  3.1*  --   --   --   --   CL 120*  --  119*  --  120*  --   --   --   --   CO2 26  --  27  --  29  --   --   --   --   BUN 14  --  20  --  24*  --   --   --   --   CREATININE 0.51  --  0.44  --  0.48  --   --   --   --   GLUCOSE 225*  --  285*  --  244*  --   --   --   --   < > = values in this interval not displayed.   Electrolytes  Recent Labs Lab 04/14/15 0515 04/14/15 1415 04/15/15 0310  CALCIUM  8.5* 8.6* 8.8*  MG 1.9  --  2.0   ABG  Recent Labs Lab 04/11/15 1545  PHART 7.408  PCO2ART 32.5*  PO2ART 368*   Liver Enzymes  Recent Labs Lab 04/11/15 0905 04/13/15 0240  AST 15 21  ALT 16 22  ALKPHOS 66 52  BILITOT 0.4 0.6  ALBUMIN 3.6 2.6*   Glucose  Recent Labs Lab 04/15/15 1134 04/15/15 1549 04/15/15 1923 04/15/15 2324 04/16/15 0329 04/16/15 0743  GLUCAP 262* 171* 204* 208* 167* 167*    Imaging No results found.   ASSESSMENT / PLAN: NEUROLOGIC A:   Lt MCA infarct s/p limited tPA and embolectomy with stenting. Cytotoxic edema. P:   RASS goal: 0 Changed to fentanyl gtt and prn versed 10/21 Serial neuro exams Defer further neuro imaging to Neuro   PULMONARY ETT 10/19 >> A: Compromised airway in setting of CVA. Tobacco abuse. P:   Pressure support wean as tolerated >> vent mechanics ok but mental status does not support extubation at this point Family will want trach > will work on setting this up F/u CXR  CARDIOVASCULAR Rt IJ CVL 10/20 >> A:  Malignant HTN. Hx of CAD, HTN, PAD, HLD. Bradycardia 10/20 from diprivan. P:  Continue ASA, pravachol, norvasc  Increase catapres 10/24,   Restart lisinopril 10/24 at half home dose > 10mg  qd Hold outpt  toprol Wean nicardipine to off if able Goal SBP < 160  RENAL A:   Medically induced hypernatremia. Hypokalemia. P:   Goal Na 150 to 155 per neurology Replace electrolytes as indicated  GASTROINTESTINAL A:   Nutrition. P:   Tube feeds  Protonix for SUP  HEMATOLOGIC A:  No acute issues. P:  F/u CBC SCD's for DVT prevention  INFECTIOUS A:   Temp 100.8 on 10/21 >> no other signs of infection. P:   If fever recurs, then send cultures and add abx  ENDOCRINE A:  Hx of DM.   P:   SSI Hold outpt glipizide   CC Time: 35 minutes   Baltazar Apo, MD, PhD 04/16/2015, 10:56 AM [facility] [phone] or if no answer [phone]

## 2015-04-15 ENCOUNTER — Inpatient Hospital Stay (HOSPITAL_COMMUNITY): Payer: 59

## 2015-04-15 LAB — SODIUM
SODIUM: 155 mmol/L — AB (ref 135–145)
SODIUM: 156 mmol/L — AB (ref 135–145)
Sodium: 157 mmol/L — ABNORMAL HIGH (ref 135–145)
Sodium: 157 mmol/L — ABNORMAL HIGH (ref 135–145)

## 2015-04-15 LAB — GLUCOSE, CAPILLARY
GLUCOSE-CAPILLARY: 183 mg/dL — AB (ref 65–99)
GLUCOSE-CAPILLARY: 262 mg/dL — AB (ref 65–99)
GLUCOSE-CAPILLARY: 171 mg/dL — AB (ref 65–99)
Glucose-Capillary: 184 mg/dL — ABNORMAL HIGH (ref 65–99)
Glucose-Capillary: 204 mg/dL — ABNORMAL HIGH (ref 65–99)
Glucose-Capillary: 208 mg/dL — ABNORMAL HIGH (ref 65–99)

## 2015-04-15 LAB — BASIC METABOLIC PANEL
Anion gap: 5 (ref 5–15)
BUN: 24 mg/dL — AB (ref 6–20)
CHLORIDE: 120 mmol/L — AB (ref 101–111)
CO2: 29 mmol/L (ref 22–32)
CREATININE: 0.48 mg/dL (ref 0.44–1.00)
Calcium: 8.8 mg/dL — ABNORMAL LOW (ref 8.9–10.3)
GFR calc Af Amer: >60 mL/min (ref >60)
GFR calc non Af Amer: >60 mL/min (ref >60)
Glucose, Bld: 244 mg/dL — ABNORMAL HIGH (ref 65–99)
Potassium: 3.1 mmol/L — ABNORMAL LOW (ref 3.5–5.1)
SODIUM: 154 mmol/L — AB (ref 135–145)

## 2015-04-15 LAB — CBC
HEMATOCRIT: 35.8 % — AB (ref 36.0–46.0)
Hemoglobin: 11.1 g/dL — ABNORMAL LOW (ref 12.0–15.0)
MCH: 28.5 pg (ref 26.0–34.0)
MCHC: 31 g/dL (ref 30.0–36.0)
MCV: 92 fL (ref 78.0–100.0)
Platelets: 143 10*3/uL — ABNORMAL LOW (ref 150–400)
RBC: 3.89 MIL/uL (ref 3.87–5.11)
RDW: 14.1 % (ref 11.5–15.5)
WBC: 10.2 10*3/uL (ref 4.0–10.5)

## 2015-04-15 LAB — MAGNESIUM: MAGNESIUM: 2 mg/dL (ref 1.7–2.4)

## 2015-04-15 MED ORDER — POTASSIUM CHLORIDE 20 MEQ/15ML (10%) PO SOLN
20.0000 meq | Freq: Two times a day (BID) | ORAL | Status: DC
  Administered 2015-04-15 – 2015-04-27 (×25): 20 meq
  Filled 2015-04-15 (×29): qty 15

## 2015-04-15 NOTE — Progress Notes (Signed)
Wasted 200 mL of Fentanyl. Witnessed by Lynelle Smoke, Therapist, sports.

## 2015-04-15 NOTE — Progress Notes (Signed)
STROKE TEAM PROGRESS NOTE   SUBJECTIVE (INTERVAL HISTORY) The patient is lethargic, difficult to arouse. The family is not at bedside. The patient had a pacemaker placed 04/13/15, heart rates are being maintained overnight. Potassium is low, the patient is getting supplementation. Family has decided to make the patient full code. CCM following.  OBJECTIVE Temp:  [99.3 F (37.4 C)-101.5 F (38.6 C)] 100.2 F (37.9 C) (10/23 0800) Pulse Rate:  [49-83] 72 (10/23 0800) Cardiac Rhythm:  [-] Sinus bradycardia;Other (Comment) (10/23 0800) Resp:  [14-19] 14 (10/23 0800) BP: (109-184)/(43-84) 182/60 mmHg (10/23 0800) SpO2:  [99 %-100 %] 100 % (10/23 0800) FiO2 (%):  [40 %] 40 % (10/23 0729) Weight:  [205 lb 11 oz (93.3 kg)] 205 lb 11 oz (93.3 kg) (10/23 0400)  CBC:   Recent Labs Lab 04/11/15 0905  04/13/15 0723 04/14/15 0515 04/15/15 0310  WBC 9.4  --  10.6* 10.9* 10.2  NEUTROABS 6.4  --  7.7  --   --   HGB 15.2*  < > 12.0 11.5* 11.1*  HCT 44.8  < > 36.4 36.1 35.8*  MCV 86.3  --  90.3 91.6 92.0  PLT 192  --  PLATELET CLUMPS NOTED ON SMEAR, COUNT APPEARS ADEQUATE 150 143*  < > = values in this interval not displayed.  Basic Metabolic Panel:   Recent Labs Lab 04/14/15 0515  04/14/15 1415 04/14/15 2145 04/15/15 0310  NA 152*  < > 151* 155* 154*  K 2.4*  --  3.0*  --  3.1*  CL 120*  --  119*  --  120*  CO2 26  --  27  --  29  GLUCOSE 225*  --  285*  --  244*  BUN 14  --  20  --  24*  CREATININE 0.51  --  0.44  --  0.48  CALCIUM 8.5*  --  8.6*  --  8.8*  MG 1.9  --   --   --  2.0  < > = values in this interval not displayed.  Lipid Panel:     Component Value Date/Time   CHOL 208* 04/12/2015 0315   TRIG 231* 04/12/2015 0315   HDL 29* 04/12/2015 0315   CHOLHDL 7.2 04/12/2015 0315   VLDL 46* 04/12/2015 0315   LDLCALC 133* 04/12/2015 0315   HgbA1c:  Lab Results  Component Value Date   HGBA1C 11.4* 04/12/2015   Urine Drug Screen: No results found for: LABOPIA,  COCAINSCRNUR, LABBENZ, AMPHETMU, THCU, LABBARB    IMAGING  Ct Head Wo Contrast  04/12/2105  1. Large acute left MCA territory infarct with resolving associated hemorrhage. Similar local mass effect without midline shift. 2. No new intracranial process.  04/12/2015  Acute large LEFT MCA territory infarct with similar patchy areas of hemorrhage. Local mass effect without midline shift. Mildly dense intracranial vessels most compatible with residual contrast.   04/11/2015  Acute hemorrhage in the left temporal lobe. This may be within the sylvian fissure rather than the insular cortex. There are small gas bubbles in the area of hemorrhage which may be extra vascular gas in the subarachnoid space. This is likely related to the procedure and micro perforation of a vessel. Acute infarct left temporal lobe and possibly left basal ganglia. There is local edema without midline shift   04/11/2015  Slightly more conspicuous loss of gray-white differentiation affecting the left temporal lobe and frontoparietal regions in the left basal ganglia consistent with acute left MCA infarction. Aspects score difficult because  of some motion, but estimated at 5 or 6.   04/11/2015  CT findings consistent with early left MCA infarct with a dense left MCA sign. No mass effect or hemorrhage.   2D Echocardiogram   - Left ventricle: The cavity size was normal. Systolic function wasnormal. The estimated ejection fraction was in the range of 55%to 60%. Wall motion was normal; there were no regional wallmotion abnormalities. - Aortic valve: Valve area (VTI): 2.24 cm^2. Valve area (Vmax): 2cm^2. Valve area (Vmean): 2.04 cm^2. - Atrial septum: No defect or patent foramen ovale was identified.    Physical Exam  General: The patient is lethargic, difficult to arouse, intubated.  Respiratory: Clear lung fields  Cardiovascular: Regular rate and rhythm  Abdomen: Soft, nontender, positive bowel sounds  Skin: 1+ edema  in the legs noted..   Neurologic Exam  Mental status: The patient is lethargic, difficult to arouse. The patient is intubated. She will not follow commands.  Cranial nerves: There is a slight depression of the right nasolabial fold. Pupils are now equal, 2-3 mm. Trace reactive. The patient is not blink to threat from either side.  Motor: The patient will move the left arm and leg with painful stimulation, subtle movement also seen of the right leg, reflexic. With sternal rub, there is some extension of the right arm, and both legs. Some purposeful movement of the left leg is seen.  Sensory examination: Minimal response at this time is seen with the pain stimulation on all 4 extremities.  Coordination: The patient is unable to cooperate for cerebellar testing..  Gait and station: The patient is bedridden, comatose, unable to ambulate.  Reflexes: Deep tendon reflexes are symmetric. Upgoing toe noted on the right. Reflex movements of the right leg with stimulation.    ASSESSMENT/PLAN [name] is a 59 y.o. female with history of diabetes mellitus, hypertension, hyperlipidemia and fibromyalgia presenting with new onset speech difficulty and right hemiparesis. She received IV t-PA (total 50 mg, 19 mg not received) due to elevated BP. 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 Post tPA/procedure symptomatic ICH with cytotoxic cerebral edema  Resultant  Respiratory failure, comatose state, right hemiparesis, dysphagia, cerebral edema, brain herniation  CT 10/21 confirms patch post tPA hemorrhage  Repeat CT head still with large L MCA infarct, hemorrhage resolving, edema, no midline  shift  2D Echo  EF 55-60%, no source of embolus  LDL 133  HgbA1c pending   SCDs for VTE prophylaxis Diet NPO time specified  No antithrombotic prior to admission.now on aspirin suppository. Will change to per tube  Ongoing aggressive stroke risk factor management  Continue ICU level care.   Dr. [name] discussed diagnosis, prognosis,  treatment options and plan of care with son. Her stroke is disabling. She may or may not survive. If she does survive, she will be disabled and need 24h care. He understands the severity of her illness. Full code at this time. She is at risk for neuroworsening from cerebral edema, large stroke and medical co-morbidities. Stroke MD coverage for weekend and next week discussed with son. .     Therapy recommendations:  pending   Disposition:  pending   Cytotoxic cerebral edema Induced hypernatremia  CCM following  Started 3% saline 04/13/2015  Na 135 prior to, now 152.   Carotid stenosis  L ICA occlusion  S/p acute stent placement (Dr. Arizona Constable at [facility])  Start on aspirin 300 mg suppository  Acute respiratory Failure  Intubated for procedure  Unable to protect airway  CCM managing  Malignant Hypertension  BP 179/82 on arrival, as high as 213/97  On cardene drip during the night  BP now 177/80  SBP goal < 160 given hemorrhage  Hyperlipidemia  Home meds:  pravachol 40  LDL 133, goal < 70  Resume in hospital once tube placed/pt stable  Plan continue statin at discharge  Bradycardia  Down to the 30s both pre-IR and post CL placement  BB not recommended  Cardiology has seen, pacemaker placed  Diabetes type II  HgbA1c 11.4 , goal < 7.0  On lantus 30 u daily PTA, diabetic RN recommends 50% home lantus dose. Ordered.  Other Stroke Risk Factors  Cigarette smoker, quit smoking  ETOH use  Obesity, Body mass index is 33.21 kg/(m^2).   Coronary artery disease  L femoral bypass  Other Active Problems  Hx breast  cancer  AKI - Cr 0.5  Hospital day # [place]   Phone [phone]   The patient remains intubated, on hypertonic saline, cardiology is following, heart rates better with pacemaker. The patient is now a full code as per family. Potassium is 3.1, will give suplimentation through NG tube.  Critical care is following.  [facility] See Amion for Pager information 04/15/2015 8:59 AM        To contact Stroke Continuity provider, please refer to [email or web address] After hours, contact General Neurology

## 2015-04-15 NOTE — Progress Notes (Signed)
TELEMETRY: Reviewed telemetry pt in NSR rate 50-80 bpm. Still some pacing spikes seen but mostly on QRS : Filed Vitals:   04/15/15 0729 04/15/15 0800 04/15/15 0900 04/15/15 0941  BP: 162/51 182/60 122/61 179/64  Pulse: 53 72 74   Temp:  100.2 F (37.9 C) 100 F (37.8 C)   TempSrc:      Resp: 17 14 19    Height:      Weight:      SpO2: 100% 100% 100%     Intake/Output Summary (Last 24 hours) at 04/15/15 1019 Last data filed at 04/15/15 0900  Gross per 24 hour  Intake 1365.96 ml  Output   1400 ml  Net -34.04 ml   Filed Weights   04/13/15 0500 04/14/15 0500 04/15/15 0400  Weight: 92.7 kg (204 lb 5.9 oz) 94.3 kg (207 lb 14.3 oz) 93.3 kg (205 lb 11 oz)    Subjective On vent. Unresponsive.   Marland Kitchen amLODipine  10 mg Per Tube Daily  . antiseptic oral rinse  7 mL Mouth Rinse Q2H  . aspirin  325 mg Per Tube Daily  . chlorhexidine gluconate  15 mL Mouth Rinse BID  . cloNIDine  0.1 mg Oral BID  . feeding supplement (PRO-STAT SUGAR FREE 64)  60 mL Per Tube BID  . feeding supplement (VITAL HIGH PROTEIN)  1,000 mL Per Tube Q24H  . insulin aspart  0-20 Units Subcutaneous 6 times per day  . insulin glargine  15 Units Subcutaneous QHS  . pantoprazole sodium  40 mg Per Tube Q24H  . potassium chloride  20 mEq Per Tube BID  . pravastatin  40 mg Per Tube Daily   . sodium chloride 10 mL/hr at 04/14/15 1410  . fentaNYL infusion INTRAVENOUS 25 mcg/hr (04/15/15 0825)  . niCARDipine Stopped (04/14/15 0031)  . sodium chloride (hypertonic) Stopped (04/13/15 1917)    LABS: Basic Metabolic Panel:  Recent Labs  04/14/15 0515  04/14/15 1415 04/14/15 2145 04/15/15 0310  NA 152*  < > 151* 155* 154*  K 2.4*  --  3.0*  --  3.1*  CL 120*  --  119*  --  120*  CO2 26  --  27  --  29  GLUCOSE 225*  --  285*  --  244*  BUN 14  --  20  --  24*  CREATININE 0.51  --  0.44  --  0.48  CALCIUM 8.5*  --  8.6*  --  8.8*  MG 1.9  --   --   --  2.0  < > = values in this interval not displayed. Liver  Function Tests:  Recent Labs  04/13/15 0240  AST 21  ALT 22  ALKPHOS 52  BILITOT 0.6  PROT 5.2*  ALBUMIN 2.6*   No results for input(s): LIPASE, AMYLASE in the last 72 hours. CBC:  Recent Labs  04/13/15 0723 04/14/15 0515 04/15/15 0310  WBC 10.6* 10.9* 10.2  NEUTROABS 7.7  --   --   HGB 12.0 11.5* 11.1*  HCT 36.4 36.1 35.8*  MCV 90.3 91.6 92.0  PLT PLATELET CLUMPS NOTED ON SMEAR, COUNT APPEARS ADEQUATE 150 143*   Cardiac Enzymes: No results for input(s): CKTOTAL, CKMB, CKMBINDEX, TROPONINI in the last 72 hours. BNP: No results for input(s): PROBNP in the last 72 hours. D-Dimer: No results for input(s): DDIMER in the last 72 hours. Hemoglobin A1C: No results for input(s): HGBA1C in the last 72 hours. Fasting Lipid Panel: No results for input(s): CHOL,  HDL, LDLCALC, TRIG, CHOLHDL, LDLDIRECT in the last 72 hours. Thyroid Function Tests: No results for input(s): TSH, T4TOTAL, T3FREE, THYROIDAB in the last 72 hours.  Invalid input(s): FREET3   Radiology/Studies:  Dg Chest Port 1 View  04/15/2015  CLINICAL DATA:  Acute respiratory failure EXAM: PORTABLE CHEST 1 VIEW COMPARISON:  04/14/2015 FINDINGS: Endotracheal tube terminates 5.5 cm above the carina. Lungs are grossly clear.  No pleural effusion or pneumothorax. The heart is normal in size. Right IJ venous catheter with transvenous pacer. Enteric tube courses into the stomach. IMPRESSION: Endotracheal tube terminates 5.5 cm above the carina. Additional stable support apparatus as above. Electronically Signed   By: Julian Hy M.D.   On: 04/15/2015 09:57   Dg Chest Port 1 View  04/14/2015  CLINICAL DATA:  Compromised airway. EXAM: PORTABLE CHEST 1 VIEW COMPARISON:  04/13/2015 FINDINGS: Endotracheal tube terminates at the level of the clavicular heads. Right jugular central venous catheter terminates over the lower SVC. Enteric tube courses into the left upper abdomen with tip not imaged. There has been interval  placement of a transvenous pacemaker lead from an inferior approach with tip projecting in the expected region of the right ventricular apex, incompletely evaluated on this single AP projection. Cardiac silhouette is upper limits of normal in size. The lungs are better inflated than on the prior study. No confluent airspace opacity, overt edema, pleural effusion, or pneumothorax is identified. IMPRESSION: 1. Support devices as above. 2. Improved lung volumes.  No evidence of pneumonia or edema. Electronically Signed   By: Logan Bores M.D.   On: 04/14/2015 08:44    PHYSICAL EXAM PE: General:sedated, on vent, opens eyes when name called Skin:Warm and dry, brisk capillary refill HEENT:normocephalic, mucus membranes moist Neck:supple, no JVD on Lt IJ in Rt  Heart:S1S2 RRR without murmur, gallup, rub or click Lungs: without rales, + rhonchi, no wheezes ZOX:WRUE, non tender, + BS, do not palpate liver spleen or masses Ext:no lower ext edema, 2+ pedal pulses, 2+ radial pulses Neuro:sedated, opens eyes with name called.    ASSESSMENT AND PLAN: 1. Marked sinus bradycardia with pauses > 4 seconds/ asystole. Most likely related to acute CNS event with CVA and ICH post TPA. Bradycardia appears to be improving.  Need to avoid beta blockers or other rate slowing meds (still getting prn labetalol for BP). Temporary pacer rate reduced to 30. If no significant pacing can probably remove tomorrow.  2. CVA s/p TPA and mechanical embolectomy with ICH 3. Acute respiratory failure on vent. 4. Hypokalemia. Need to replete >4.0  Temp pacer assessed and still has excellent thresholds down to 1.0 mA.  Present on Admission:  . CVA (cerebral infarction) . Stroke (cerebrum) ([place])  Signed, Peter Martinique, [place] 04/15/2015 10:19 AM

## 2015-04-16 ENCOUNTER — Inpatient Hospital Stay (HOSPITAL_COMMUNITY): Payer: 59

## 2015-04-16 ENCOUNTER — Encounter (HOSPITAL_COMMUNITY): Payer: Self-pay | Admitting: Cardiology

## 2015-04-16 DIAGNOSIS — I469 Cardiac arrest, cause unspecified: Secondary | ICD-10-CM

## 2015-04-16 LAB — BASIC METABOLIC PANEL
ANION GAP: 4 — AB (ref 5–15)
Anion gap: 8 (ref 5–15)
BUN: 20 mg/dL (ref 6–20)
BUN: 19 mg/dL (ref 6–20)
CHLORIDE: 125 mmol/L — AB (ref 101–111)
CHLORIDE: 122 mmol/L — AB (ref 101–111)
CO2: 29 mmol/L (ref 22–32)
CO2: 29 mmol/L (ref 22–32)
CREATININE: 0.43 mg/dL — AB (ref 0.44–1.00)
CREATININE: 0.44 mg/dL (ref 0.44–1.00)
Calcium: 8.6 mg/dL — ABNORMAL LOW (ref 8.9–10.3)
Calcium: 9 mg/dL (ref 8.9–10.3)
GFR calc Af Amer: >60 mL/min (ref >60)
GFR calc non Af Amer: >60 mL/min (ref >60)
GFR calc non Af Amer: >60 mL/min (ref >60)
Glucose, Bld: 200 mg/dL — ABNORMAL HIGH (ref 65–99)
Glucose, Bld: 202 mg/dL — ABNORMAL HIGH (ref 65–99)
POTASSIUM: 2.7 mmol/L — AB (ref 3.5–5.1)
POTASSIUM: 2.7 mmol/L — AB (ref 3.5–5.1)
SODIUM: 158 mmol/L — AB (ref 135–145)
Sodium: 159 mmol/L — ABNORMAL HIGH (ref 135–145)

## 2015-04-16 LAB — GLUCOSE, CAPILLARY
GLUCOSE-CAPILLARY: 167 mg/dL — AB (ref 65–99)
GLUCOSE-CAPILLARY: 212 mg/dL — AB (ref 65–99)
Glucose-Capillary: 167 mg/dL — ABNORMAL HIGH (ref 65–99)
Glucose-Capillary: 241 mg/dL — ABNORMAL HIGH (ref 65–99)
Glucose-Capillary: 180 mg/dL — ABNORMAL HIGH (ref 65–99)

## 2015-04-16 LAB — SODIUM
SODIUM: 158 mmol/L — AB (ref 135–145)
Sodium: 158 mmol/L — ABNORMAL HIGH (ref 135–145)
Sodium: 157 mmol/L — ABNORMAL HIGH (ref 135–145)

## 2015-04-16 MED ORDER — LISINOPRIL 10 MG PO TABS
10.0000 mg | ORAL_TABLET | Freq: Every day | ORAL | Status: DC
  Administered 2015-04-16 – 2015-04-17 (×2): 10 mg
  Filled 2015-04-16 (×2): qty 1

## 2015-04-16 MED ORDER — ATROPINE SULFATE 0.1 MG/ML IJ SOLN
INTRAMUSCULAR | Status: DC
Start: 2015-04-16 — End: 2015-04-17
  Filled 2015-04-16: qty 10

## 2015-04-16 MED ORDER — CLONIDINE ORAL SUSPENSION 10 MCG/ML
0.3000 mg | Freq: Two times a day (BID) | ORAL | Status: DC
  Administered 2015-04-16 – 2015-04-17 (×2): 0.3 mg via ORAL
  Filled 2015-04-16 (×4): qty 30

## 2015-04-16 MED ORDER — POTASSIUM CHLORIDE 20 MEQ PO PACK
40.0000 meq | PACK | ORAL | Status: DC
  Filled 2015-04-16 (×2): qty 2

## 2015-04-16 MED ORDER — POTASSIUM CHLORIDE 20 MEQ/15ML (10%) PO SOLN
40.0000 meq | ORAL | Status: AC
Start: 2015-04-16 — End: 2015-04-16
  Administered 2015-04-16 (×2): 40 meq via ORAL
  Filled 2015-04-16 (×2): qty 30

## 2015-04-16 MED FILL — Lidocaine HCl Local Preservative Free (PF) Inj 1%: INTRAMUSCULAR | Qty: 30 | Status: AC

## 2015-04-16 NOTE — Progress Notes (Signed)
STROKE TEAM PROGRESS NOTE   SUBJECTIVE (INTERVAL HISTORY) The patient is lethargic, difficult to arouse. The son is at bedside. The patient had a temporary pacemaker placed  last Friday but heart rate has remained stable and plans are to discontinue it today patient is sedated on Versed and fentanyl. Repeat CT scan is pending today. Serum sodium is elevated at 158. Hypertonic saline drip has been off.  OBJECTIVE Temp:  [99.1 F (37.3 C)-101.3 F (38.5 C)] 99.9 F (37.7 C) (10/24 1500) Pulse Rate:  [46-110] 53 (10/24 1500) Cardiac Rhythm:  [-] Normal sinus rhythm;Sinus bradycardia (10/24 0800) Resp:  [15-31] 15 (10/24 1500) BP: (98-211)/(42-134) 136/51 mmHg (10/24 1500) SpO2:  [99 %-100 %] 100 % (10/24 1500) FiO2 (%):  [40 %] 40 % (10/24 1508) Weight:  [208 lb 8.9 oz (94.6 kg)] 208 lb 8.9 oz (94.6 kg) (10/24 0500)  CBC:   Recent Labs Lab 04/11/15 0905  04/13/15 0723 04/14/15 0515 04/15/15 0310  WBC 9.4  --  10.6* 10.9* 10.2  NEUTROABS 6.4  --  7.7  --   --   HGB 15.2*  < > 12.0 11.5* 11.1*  HCT 44.8  < > 36.4 36.1 35.8*  MCV 86.3  --  90.3 91.6 92.0  PLT 192  --  PLATELET CLUMPS NOTED ON SMEAR, COUNT APPEARS ADEQUATE 150 143*  < > = values in this interval not displayed.  Basic Metabolic Panel:   Recent Labs Lab 04/14/15 0515  04/15/15 0310  04/16/15 0735 04/16/15 0959  NA 152*  < > 154*  < > 158* 159*  K 2.4*  < > 3.1*  --  2.7* 2.7*  CL 120*  < > 120*  --  125* 122*  CO2 26  < > 29  --  29 29  GLUCOSE 225*  < > 244*  --  200* 202*  BUN 14  < > 24*  --  20 19  CREATININE 0.51  < > 0.48  --  0.43* 0.44  CALCIUM 8.5*  < > 8.8*  --  8.6* 9.0  MG 1.9  --  2.0  --   --   --   < > = values in this interval not displayed.  Lipid Panel:     Component Value Date/Time   CHOL 208* 04/12/2015 0315   TRIG 231* 04/12/2015 0315   HDL 29* 04/12/2015 0315   CHOLHDL 7.2 04/12/2015 0315   VLDL 46* 04/12/2015 0315   LDLCALC 133* 04/12/2015 0315   HgbA1c:  Lab Results   Component Value Date   HGBA1C 11.4* 04/12/2015   Urine Drug Screen: No results found for: LABOPIA, COCAINSCRNUR, LABBENZ, AMPHETMU, THCU, LABBARB    IMAGING  Ct Head Wo Contrast  04/12/2105  1. Large acute left MCA territory infarct with resolving associated hemorrhage. Similar local mass effect without midline shift. 2. No new intracranial process.  04/12/2015  Acute large LEFT MCA territory infarct with similar patchy areas of hemorrhage. Local mass effect without midline shift. Mildly dense intracranial vessels most compatible with residual contrast.   04/11/2015  Acute hemorrhage in the left temporal lobe. This may be within the sylvian fissure rather than the insular cortex. There are small gas bubbles in the area of hemorrhage which may be extra vascular gas in the subarachnoid space. This is likely related to the procedure and micro perforation of a vessel. Acute infarct left temporal lobe and possibly left basal ganglia. There is local edema without midline shift   04/11/2015  Slightly more conspicuous loss of gray-white differentiation affecting the left temporal lobe and frontoparietal regions in the left basal ganglia consistent with acute left MCA infarction. Aspects score difficult because of some motion, but estimated at 5 or 6.   04/11/2015  CT findings consistent with early left MCA infarct with a dense left MCA sign. No mass effect or hemorrhage.   2D Echocardiogram   - Left ventricle: The cavity size was normal. Systolic function wasnormal. The estimated ejection fraction was in the range of 55%to 60%. Wall motion was normal; there were no regional wallmotion abnormalities. - Aortic valve: Valve area (VTI): 2.24 cm^2. Valve area (Vmax): 2cm^2. Valve area (Vmean): 2.04 cm^2. - Atrial septum: No defect or patent foramen ovale was identified.    Physical Exam  General: The patient is lethargic, difficult to arouse, intubated.  Respiratory: Clear lung  fields  Cardiovascular: Regular rate and rhythm  Abdomen: Soft, nontender, positive bowel sounds  Skin: 1+ edema in the legs noted..   Neurologic Exam  Mental status: The patient is lethargic, difficult to arouse. The patient is intubated. She will not follow commands.  Cranial nerves: There is a slight depression of the right nasolabial fold. Pupils are now equal, 2-3 mm. Trace reactive. The patient is not blink to threat from either side.  Motor: The patient will move the left arm and leg with painful stimulation, subtle movement also seen of the right leg. With sternal rub, there is some extension of the right arm.  Sensory examination: Minimal response at this time is seen with the pain stimulation on all 4 extremities.  Coordination: The patient is unable to cooperate for cerebellar testing..  Gait and station: The patient is bedridden, comatose, unable to ambulate.  Reflexes: Deep tendon reflexes are symmetric. Upgoing toe noted on the right.    ASSESSMENT/PLAN Ms. DAJANE VALLI is a 59 y.o. female with history of diabetes mellitus, hypertension, hyperlipidemia and fibromyalgia presenting with new onset speech difficulty and right hemiparesis. She received IV t-PA (total 50 mg, 19 mg not received) due to elevated BP. 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 Post tPA/procedure symptomatic ICH with cytotoxic cerebral edema  Resultant  Respiratory failure, comatose state, right hemiparesis, dysphagia, cerebral edema, brain herniation  CT 10/21 confirms patch post tPA hemorrhage  Repeat CT head still with large L MCA infarct, hemorrhage resolving, edema, no midline  shift  2D Echo  EF 55-60%, no source of embolus  LDL 133  HgbA1c pending   SCDs for VTE prophylaxis Diet NPO time specified  No antithrombotic prior to admission.now on aspirin suppository. Will change to per tube  Ongoing aggressive stroke risk factor management  Continue ICU level care.   Dr. [name] discussed diagnosis, prognosis,  treatment options and plan of care with son. Her stroke is disabling. She may or may not survive. If she does survive, she will be disabled and need 24h care. He understands the severity of her illness. Full code at this time. She is at risk for neuroworsening from cerebral edema, large stroke and medical co-morbidities. Stroke MD coverage for weekend and next week discussed with son. .     Therapy recommendations:  pending   Disposition:  pending  Cytotoxic cerebral edema Induced hypernatremia  CCM following  Started 3% saline 04/13/2015  Na 135 prior to, now 152.   Carotid stenosis  L ICA occlusion  S/p acute stent placement (Dr. Arizona Constable at [facility])  Start on aspirin 300 mg suppository  Acute respiratory Failure  Intubated for procedure  Unable to protect airway  CCM managing  Malignant Hypertension  BP 179/82 on arrival, as high as 213/97  On cardene drip during the night  BP now 177/80  SBP goal < 160 given hemorrhage  Hyperlipidemia  Home meds:  pravachol 40  LDL 133, goal < 70  Resume in hospital once tube placed/pt stable  Plan continue statin at discharge  Bradycardia  Down to the 30s both pre-IR and post CL placement  BB not recommended  Cardiology has seen, pacemaker placed  Diabetes type II  HgbA1c 11.4 , goal < 7.0  On lantus 30 u daily PTA, diabetic RN recommends 50% home lantus dose. Ordered.  Other Stroke Risk Factors  Cigarette smoker, quit smoking  ETOH use  Obesity, Body mass index is 33.68 kg/(m^2).   Coronary artery disease  L femoral bypass  Other Active Problems  Hx breast  cancer  AKI - Cr 0.5  Hospital day # 5      The patient remains intubated, off hypertonic saline, cardiology is following, heart rates better with pacemaker. The patient is now a full code as per family.  Burnis Medin follow neurologic status over time. Plan to repeat CT scan of the head today. Long discussion at the bedside with the patient's son regarding patient's prognosis and answered questions. Family is agreeable to tracheostomy and PEG tube and we will arrange this over the next few days. Discussion with Dr. Lamonte Sakai and Dr. Grandville Silos This patient is critically ill and at significant risk of neurological worsening, death and care requires constant monitoring of vital signs, hemodynamics,respiratory and cardiac monitoring, extensive review of multiple databases, frequent neurological assessment, discussion with family, other specialists and medical decision making of high complexity.I have made any additions or clarifications directly to the above note.This critical care time does not reflect procedure time, or teaching time or supervisory time of PA/NP/Med Resident etc but could involve care discussion time.  I spent 30 minutes of neurocritical care time  in the care of  this patient.    Antony Contras, MD  [facility] [place] for Pager information 04/16/2015 3:36 PM      04/16/2015 3:36 PM    To contact Stroke Continuity provider, please refer to [email or web address] After hours, contact General Neurology

## 2015-04-16 NOTE — Progress Notes (Signed)
PT Cancellation Note  Patient Details Name: Kerry [name] MRN: [id number] DOB: [birth year]   Cancelled Treatment:    Reason Eval/Treat Not Completed: Patient not medically ready.  Spoke with RN and pt is not medically ready for PT and mobility at this time.  Will f/u as appropriate.     Evelise Reine, [place] Papas 04/16/2015, 10:08 AM

## 2015-04-16 NOTE — Progress Notes (Signed)
Subjective: Intubated  Sedated    Objective: Vital signs in last 24 hours: Temp:  [99.1 F (37.3 C)-101.3 F (38.5 C)] 99.9 F (37.7 C) (10/24 0645) Pulse Rate:  [50-110] 50 (10/24 0645) Resp:  [14-22] 16 (10/24 0645) BP: (98-211)/(42-134) 131/52 mmHg (10/24 0645) SpO2:  [99 %-100 %] 100 % (10/24 0645) FiO2 (%):  [40 %] 40 % (10/24 0350) Weight:  [208 lb 8.9 oz (94.6 kg)] 208 lb 8.9 oz (94.6 kg) (10/24 0500)    Intake/Output from previous day: 10/23 0701 - 10/24 0700 In: 1768.5 [I.V.:1048.5; NG/GT:720] Out: 1985 [Urine:1985] Intake/Output this shift:    Medications Scheduled Meds: . amLODipine  10 mg Per Tube Daily  . antiseptic oral rinse  7 mL Mouth Rinse Q2H  . aspirin  325 mg Per Tube Daily  . chlorhexidine gluconate  15 mL Mouth Rinse BID  . cloNIDine  0.1 mg Oral BID  . feeding supplement (PRO-STAT SUGAR FREE 64)  60 mL Per Tube BID  . feeding supplement (VITAL HIGH PROTEIN)  1,000 mL Per Tube Q24H  . insulin aspart  0-20 Units Subcutaneous 6 times per day  . insulin glargine  15 Units Subcutaneous QHS  . pantoprazole sodium  40 mg Per Tube Q24H  . potassium chloride  20 mEq Per Tube BID  . pravastatin  40 mg Per Tube Daily   Continuous Infusions: . sodium chloride 10 mL/hr at 04/15/15 1623  . fentaNYL infusion INTRAVENOUS 25 mcg/hr (04/16/15 0610)  . niCARDipine 3 mg/hr (04/16/15 0620)  . sodium chloride (hypertonic) Stopped (04/13/15 1917)   PRN Meds:.acetaminophen (TYLENOL) oral liquid 160 mg/5 mL, [DISCONTINUED] acetaminophen **OR** acetaminophen, bisacodyl, fentaNYL (SUBLIMAZE) injection, hydrALAZINE, midazolam, sennosides  PE: General appearance: Intubated and sedated. Lungs: Right > left rhonchi Heart: regular rate and rhythm and 1/6 sys MM Abdomen: +BS, soft. Extremities: No LEE Pulses: 2+ and symmetric Skin: Warm and dry Neurologic: Sedated, intubated.  Tele:  SR   Lab Results:   Recent Labs  04/14/15 0515 04/15/15 0310  WBC 10.9*  10.2  HGB 11.5* 11.1*  HCT 36.1 35.8*  PLT 150 143*   BMET  Recent Labs  04/14/15 0515  04/14/15 1415  04/15/15 0310 04/15/15 0958 04/15/15 1527 04/15/15 2125  NA 152*  < > 151*  < > 154* 157* 157* 156*  K 2.4*  --  3.0*  --  3.1*  --   --   --   CL 120*  --  119*  --  120*  --   --   --   CO2 26  --  27  --  29  --   --   --   GLUCOSE 225*  --  285*  --  244*  --   --   --   BUN 14  --  20  --  24*  --   --   --   CREATININE 0.51  --  0.44  --  0.48  --   --   --   CALCIUM 8.5*  --  8.6*  --  8.8*  --   --   --   < > = values in this interval not displayed.    Assessment/Plan    CVA (cerebral infarction)   Stroke (cerebrum) ([facility])   Cerebrovascular accident (CVA) due to occlusion of left middle cerebral artery ([place])   Cerebrovascular accident (CVA) due to thrombosis of precerebral artery ([place])   Encounter for central line placement   Encounter  for feeding tube placement   Asystole ([facility])   Acute respiratory failure ([facility])   Cerebral hemorrhage ([facility])   Cytotoxic cerebral edema ([facility])   Sinus pause   Sinus bradycardia Marked sinus bradycardia with pauses > 4 seconds/ asystole. Most likely related to acute CNS event with CVA and ICH post TPA.  Temp pacer rate reduced to 30 yesterday with some pacing. Tachy/brady on telemetry.  41 to 130's.  Sinus rhythm 50's currently.  No AV nodal blocking agents.  If she does not need pacing today, we can consider taking out the pacer.  rechecking K.   Hypokalemia  3.1 yesterday.  Recheck this morning.    LOS: 5 days    HAGER, BRYAN PA-C 04/16/2015 7:28 AM  Patient seen and examined.  Agree with findings of B Hager.  No significant pacing activity noted.  Would d/c.  Follow telemetry.    Kerry [name]

## 2015-04-16 NOTE — Progress Notes (Signed)
Admission MEDICATION RELATED NOTE   Pharmacy Re:  Home Meds  Assessment: Patient is currently intubated and sedated.  She cannot provide a medication history and family states they feel there may be some issue of compliance.  I reviewed the latest prescription locally for her and this is what I've found.  Of her current home meds, only the Lorazepam, Oxycodone and Lisinopril show recent refills.      LORAZEPAM 1 MG TABS 04/03/2015   30     OXYCODONE HCL 5 MG TABS 04/03/2015   60     OXYCODONE HCL 5 MG TABS 03/05/2015   60     OXYCODONE HCL 5 MG TABS 02/05/2015   60     OXYCODONE HCL 5 MG TABS 12/05/2014   60     LISINOPRIL 20 MG TABS 11/19/2014   30     OXYCODONE HCL 5 MG TABS 11/03/2014   60     LORAZEPAM 1 MG TABS 09/26/2014   30     OXYCODONE HCL 5 MG TABS 09/26/2014   60     LISINOPRIL 20 MG TABS 09/23/2014   30     LORAZEPAM 1 MG TABS 08/27/2014   30     OXYCODONE HCL 5 MG TABS 08/27/2014   60     LISINOPRIL 20 MG TABS 08/21/2014   30     LORAZEPAM 1 MG TABS 07/27/2014   30     OXYCODONE HCL 5 MG TABS 07/27/2014   60     CEPHALEXIN 500 MG CAPS 07/21/2014   21     LORAZEPAM 1 MG TABS 07/04/2014   30     OXYCODONE HCL 5 MG TABS 07/04/2014   60           I have completed her history without marking anything as taking besides the Lisinopril, Lorazepam and Oxycodone.  Please continue those you feel she may need to resume as you think appropriate.  Rober Minion, PharmD., MS Clinical Pharmacist Pager:  [phone] Thank you for allowing pharmacy to be part of this patients care team. 04/16/2015,2:36 PM

## 2015-04-16 NOTE — Progress Notes (Signed)
OT Cancellation Note  Patient Details Name: Kerry [name] MRN: [id number] DOB: 20-Feb-1956   Cancelled Treatment:    Reason Eval/Treat Not Completed: Patient not medically ready Currently on bedrest. [place], OTR/L  [phone] 04/16/2015 04/16/2015, 11:26 AM

## 2015-04-16 NOTE — Progress Notes (Signed)
CRITICAL VALUE ALERT  Critical value received:  K2.7  Date of notification:  04/16/15  Time of notification: 2119  Critical value read back:Yes.    Nurse who received alert:  Glenford Peers  MD notified (1st page):  Dr. Lamonte Sakai  Time of first page: 1230  MD notified (2nd page):  Time of second page:  Responding MD: Dr. Lamonte Sakai  Time MD responded:  [phone]

## 2015-04-16 NOTE — Consult Note (Signed)
Reason for Consult:PEG placement Referring Physician: Lekesha, Claw is an 59 y.o. female.  HPI: Kerry [name] suffered a CVA on 10/19. She is currently intubated and not following commands for healthcare providers (son reports she follows commands for him). Her primary team believes she will need tracheostomy and PEG tube placement to further her care and the son is in agreement with this.  Past Medical History  Diagnosis Date  . Hypertension   . Coronary artery disease   . Diabetes mellitus   . Breast cancer ([place]) 03/10/2007    Left breat  . Degenerative disc disease, cervical   . Degenerative disc disease, lumbar   . Fibromyalgia   . Hyperlipidemia   . Carpal tunnel syndrome on right     Past Surgical History  Procedure Laterality Date  . Cardiac surgery    . Appendectomy    . Breast surgery    . Mastectomy Left 03/2007  . Cardiac catheterization  2005  . Vein surgery Right 2005  . Tubal ligation Right   . Radiology with anesthesia N/A 04/11/2015    Procedure: RADIOLOGY WITH ANESTHESIA;  Surgeon: Luanne Bras, MD;  Location: [facility];  Service: Radiology;  Laterality: N/A;  . Cardiac catheterization N/A 04/13/2015    Procedure: Temporary Pacemaker;  Surgeon: Peter M Martinique, MD;  Location: [facility];  Service: Cardiovascular;  Laterality: N/A;    Family History  Problem Relation Age of Onset  . Cancer Mother     Breast cancer (right)  . Diabetes Sister   . Cancer Maternal Aunt     Breast cancer  . Cancer Maternal Aunt     Breast cancer  . Cancer Cousin     Breast Cancer    Social History:  reports that she has been smoking.  She has never used smokeless tobacco. She reports that she drinks alcohol. She reports that she does not use illicit drugs.  Allergies:  Allergies  Allergen Reactions  . Oxycodone     Sick   . Oxycontin [Oxycodone Hcl]     Sick     Medications: I have reviewed the patient's current medications.  Results for orders  placed or performed during the hospital encounter of 04/11/15 (from the past 48 hour(s))  Basic metabolic panel     Status: Abnormal   Collection Time: 04/14/15  2:15 PM  Result Value Ref Range   Sodium 151 (H) 135 - 145 mmol/L   Potassium 3.0 (L) 3.5 - 5.1 mmol/L    Comment: DELTA CHECK NOTED   Chloride 119 (H) 101 - 111 mmol/L   CO2 27 22 - 32 mmol/L   Glucose, Bld 285 (H) 65 - 99 mg/dL   BUN 20 6 - 20 mg/dL   Creatinine, Ser 0.44 0.44 - 1.00 mg/dL   Calcium 8.6 (L) 8.9 - 10.3 mg/dL   GFR calc non Af Amer >60 >60 mL/min   GFR calc Af Amer >60 >60 mL/min    Comment: (NOTE) The eGFR has been calculated using the CKD EPI equation. This calculation has not been validated in all clinical situations. eGFR's persistently <60 mL/min signify possible Chronic Kidney Disease.    Anion gap 5 5 - 15  Glucose, capillary     Status: Abnormal   Collection Time: 04/14/15  4:04 PM  Result Value Ref Range   Glucose-Capillary 212 (H) 65 - 99 mg/dL  Glucose, capillary     Status: Abnormal   Collection Time: 04/14/15  7:19 PM  Result Value Ref Range   Glucose-Capillary 149 (H) 65 - 99 mg/dL  Sodium     Status: Abnormal   Collection Time: 04/14/15  9:45 PM  Result Value Ref Range   Sodium 155 (H) 135 - 145 mmol/L  Glucose, capillary     Status: Abnormal   Collection Time: 04/14/15 11:27 PM  Result Value Ref Range   Glucose-Capillary 285 (H) 65 - 99 mg/dL  Basic metabolic panel     Status: Abnormal   Collection Time: 04/15/15  3:10 AM  Result Value Ref Range   Sodium 154 (H) 135 - 145 mmol/L   Potassium 3.1 (L) 3.5 - 5.1 mmol/L   Chloride 120 (H) 101 - 111 mmol/L   CO2 29 22 - 32 mmol/L   Glucose, Bld 244 (H) 65 - 99 mg/dL   BUN 24 (H) 6 - 20 mg/dL   Creatinine, Ser 0.48 0.44 - 1.00 mg/dL   Calcium 8.8 (L) 8.9 - 10.3 mg/dL   GFR calc non Af Amer >60 >60 mL/min   GFR calc Af Amer >60 >60 mL/min    Comment: (NOTE) The eGFR has been calculated using the CKD EPI equation. This calculation  has not been validated in all clinical situations. eGFR's persistently <60 mL/min signify possible Chronic Kidney Disease.    Anion gap 5 5 - 15  Magnesium     Status: None   Collection Time: 04/15/15  3:10 AM  Result Value Ref Range   Magnesium 2.0 1.7 - 2.4 mg/dL  CBC     Status: Abnormal   Collection Time: 04/15/15  3:10 AM  Result Value Ref Range   WBC 10.2 4.0 - 10.5 K/uL   RBC 3.89 3.87 - 5.11 MIL/uL   Hemoglobin 11.1 (L) 12.0 - 15.0 g/dL   HCT 35.8 (L) 36.0 - 46.0 %   MCV 92.0 78.0 - 100.0 fL   MCH 28.5 26.0 - 34.0 pg   MCHC 31.0 30.0 - 36.0 g/dL   RDW 14.1 11.5 - 15.5 %   Platelets 143 (L) 150 - 400 K/uL  Glucose, capillary     Status: Abnormal   Collection Time: 04/15/15  4:02 AM  Result Value Ref Range   Glucose-Capillary 183 (H) 65 - 99 mg/dL  Glucose, capillary     Status: Abnormal   Collection Time: 04/15/15  8:01 AM  Result Value Ref Range   Glucose-Capillary 184 (H) 65 - 99 mg/dL  Sodium     Status: Abnormal   Collection Time: 04/15/15  9:58 AM  Result Value Ref Range   Sodium 157 (H) 135 - 145 mmol/L  Glucose, capillary     Status: Abnormal   Collection Time: 04/15/15 11:34 AM  Result Value Ref Range   Glucose-Capillary 262 (H) 65 - 99 mg/dL  Sodium     Status: Abnormal   Collection Time: 04/15/15  3:27 PM  Result Value Ref Range   Sodium 157 (H) 135 - 145 mmol/L  Glucose, capillary     Status: Abnormal   Collection Time: 04/15/15  3:49 PM  Result Value Ref Range   Glucose-Capillary 171 (H) 65 - 99 mg/dL  Glucose, capillary     Status: Abnormal   Collection Time: 04/15/15  7:23 PM  Result Value Ref Range   Glucose-Capillary 204 (H) 65 - 99 mg/dL  Sodium     Status: Abnormal   Collection Time: 04/15/15  9:25 PM  Result Value Ref Range   Sodium 156 (H) 135 - 145 mmol/L  Glucose, capillary     Status: Abnormal   Collection Time: 04/15/15 11:24 PM  Result Value Ref Range   Glucose-Capillary 208 (H) 65 - 99 mg/dL  Sodium     Status: Abnormal    Collection Time: 04/16/15  3:25 AM  Result Value Ref Range   Sodium 158 (H) 135 - 145 mmol/L  Glucose, capillary     Status: Abnormal   Collection Time: 04/16/15  3:29 AM  Result Value Ref Range   Glucose-Capillary 167 (H) 65 - 99 mg/dL  Glucose, capillary     Status: Abnormal   Collection Time: 04/16/15  7:43 AM  Result Value Ref Range   Glucose-Capillary 167 (H) 65 - 99 mg/dL  Basic metabolic panel     Status: Abnormal   Collection Time: 04/16/15  9:59 AM  Result Value Ref Range   Sodium 159 (H) 135 - 145 mmol/L   Potassium 2.7 (LL) 3.5 - 5.1 mmol/L    Comment: CRITICAL RESULT CALLED TO, READ BACK BY AND VERIFIED WITH: MEYAN K RN 04/16/15 1110 COSTELLO B    Chloride 122 (H) 101 - 111 mmol/L   CO2 29 22 - 32 mmol/L   Glucose, Bld 202 (H) 65 - 99 mg/dL   BUN 19 6 - 20 mg/dL   Creatinine, Ser 0.44 0.44 - 1.00 mg/dL   Calcium 9.0 8.9 - 10.3 mg/dL   GFR calc non Af Amer >60 >60 mL/min   GFR calc Af Amer >60 >60 mL/min    Comment: (NOTE) The eGFR has been calculated using the CKD EPI equation. This calculation has not been validated in all clinical situations. eGFR's persistently <60 mL/min signify possible Chronic Kidney Disease.    Anion gap 8 5 - 15  Glucose, capillary     Status: Abnormal   Collection Time: 04/16/15 11:27 AM  Result Value Ref Range   Glucose-Capillary 241 (H) 65 - 99 mg/dL    Dg Chest Port 1 View  04/15/2015  CLINICAL DATA:  Acute respiratory failure EXAM: PORTABLE CHEST 1 VIEW COMPARISON:  04/14/2015 FINDINGS: Endotracheal tube terminates 5.5 cm above the carina. Lungs are grossly clear.  No pleural effusion or pneumothorax. The heart is normal in size. Right IJ venous catheter with transvenous pacer. Enteric tube courses into the stomach. IMPRESSION: Endotracheal tube terminates 5.5 cm above the carina. Additional stable support apparatus as above. Electronically Signed   By: Julian Hy M.D.   On: 04/15/2015 09:57    Review of Systems  Unable to  perform ROS: intubated   Blood pressure 122/56, pulse 46, temperature 100 F (37.8 C), temperature source Core (Comment), resp. rate 17, height 5' 6"  (1.676 m), weight 94.6 kg (208 lb 8.9 oz), SpO2 99 %. Physical Exam  Constitutional: She appears well-developed and well-nourished. No distress.  HENT:  Head: Normocephalic.  Cardiovascular: Normal rate, regular rhythm and normal heart sounds.  Exam reveals no gallop and no friction rub.   No murmur heard. Respiratory: Breath sounds normal. No respiratory distress. She has no wheezes. She has no rales.  GI: Soft. Bowel sounds are normal. She exhibits no distension.  Lymphadenopathy:    She has no cervical adenopathy.  Skin: She is not diaphoretic.    Assessment/Plan: Dysphagia 2/2 CVA -- Will plan on PEG on Wednesday    Kerry Abu, PA-C Pager: [phone] General Trauma PA Pager: [phone] 04/16/2015, 1:10 PM

## 2015-04-16 NOTE — Progress Notes (Signed)
[facility] Progress Note Patient Name: Kerry [name] DOB: 1955/10/22 MRN: [id number]   Date of Service  04/16/2015  HPI/Events of Note  Restraints needed  eICU Interventions       Intervention Category Minor Interventions: Routine modifications to care plan (e.g. PRN medications for pain, fever)  [name]. 04/16/2015, 8:02 PM

## 2015-04-16 NOTE — Progress Notes (Signed)
Inpatient Diabetes Program Recommendations  AACE/ADA: New Consensus Statement on Inpatient Glycemic Control (2015)  Target Ranges:  Prepandial:   less than 140 mg/dL      Peak postprandial:   less than 180 mg/dL (1-2 hours)      Critically ill patients:  140 - 180 mg/dL    Results for Kerry [name], Kerry [name] (MRN [id number]) as of 04/16/2015 07:42  Ref. Range 04/14/2015 23:27 04/15/2015 04:02 04/15/2015 08:01 04/15/2015 11:34 04/15/2015 15:49 04/15/2015 19:23  Glucose-Capillary Latest Ref Range: 65-99 mg/dL 285 (H) 183 (H) 184 (H) 262 (H) 171 (H) 204 (H)     Home DM Meds: Lantus 30 units QHS       Glipizide 10 mg daily  Current Insulin Orders: Lantus 15 units QHS       Novolog Resistant SSI (0-20 units) Q4 hours     -Patient currently receiving Vital HP tube feeds @ 30 cc/hour.  -Having glucose levels >250 mg/dl.     MD- Please consider the following in-hospital insulin adjustments:  1. Increase Lantus to 22 units QHS (75% of home dose)  2. Start Novolog Tube Feed coverage- Novolog 3 units Q4hours (hold if tube feeds held for any reason)     --Will follow patient during hospitalization--  Wyn Quaker RN, MSN, CDE Diabetes Coordinator Inpatient Glycemic Control Team Team Pager: [phone] (8a-5p)

## 2015-04-16 NOTE — Progress Notes (Signed)
Site area: Rt fem vein Site Prior to Removal:  Level 0 Pressure Applied For: 20 min Manual:  yes  Patient Status During Pull:  Intubated and sedated and unresponsive Post Pull Site:  Level 0 Post Pull Instructions Given:  Pt is intubated and sedated and unresponsive Post Pull Pulses Present: Rt dp 2+ Dressing Applied:  Tegaderm applied with 4x4 Bedrest begins @ 09:25:00 Comments: Temp pacing wire was also removed without complications vo Dr Harrington Challenger. Hr 60 Bp 157/83

## 2015-04-17 ENCOUNTER — Inpatient Hospital Stay (HOSPITAL_COMMUNITY): Payer: 59

## 2015-04-17 DIAGNOSIS — I63039 Cerebral infarction due to thrombosis of unspecified carotid artery: Secondary | ICD-10-CM

## 2015-04-17 LAB — GLUCOSE, CAPILLARY
GLUCOSE-CAPILLARY: 178 mg/dL — AB (ref 65–99)
GLUCOSE-CAPILLARY: 123 mg/dL — AB (ref 65–99)
Glucose-Capillary: 107 mg/dL — ABNORMAL HIGH (ref 65–99)
Glucose-Capillary: 189 mg/dL — ABNORMAL HIGH (ref 65–99)
Glucose-Capillary: 238 mg/dL — ABNORMAL HIGH (ref 65–99)
Glucose-Capillary: 286 mg/dL — ABNORMAL HIGH (ref 65–99)

## 2015-04-17 LAB — URINALYSIS W MICROSCOPIC (NOT AT ARMC)
BILIRUBIN URINE: NEGATIVE
GLUCOSE, UA: 500 mg/dL — AB
KETONES UR: NEGATIVE mg/dL
Nitrite: POSITIVE — AB
PH: 6 (ref 5.0–8.0)
Protein, ur: NEGATIVE mg/dL
SPECIFIC GRAVITY, URINE: 1.026 (ref 1.005–1.030)
UROBILINOGEN UA: 2 mg/dL — AB (ref 0.0–1.0)

## 2015-04-17 LAB — BASIC METABOLIC PANEL
Anion gap: 6 (ref 5–15)
BUN: 20 mg/dL (ref 6–20)
CALCIUM: 8.8 mg/dL — AB (ref 8.9–10.3)
CHLORIDE: 125 mmol/L — AB (ref 101–111)
CO2: 28 mmol/L (ref 22–32)
CREATININE: 0.41 mg/dL — AB (ref 0.44–1.00)
GFR calc non Af Amer: >60 mL/min (ref >60)
Glucose, Bld: 209 mg/dL — ABNORMAL HIGH (ref 65–99)
Potassium: 3.4 mmol/L — ABNORMAL LOW (ref 3.5–5.1)
Sodium: 159 mmol/L — ABNORMAL HIGH (ref 135–145)

## 2015-04-17 LAB — CBC
HCT: 35.7 % — ABNORMAL LOW (ref 36.0–46.0)
Hemoglobin: 10.9 g/dL — ABNORMAL LOW (ref 12.0–15.0)
MCH: 28.5 pg (ref 26.0–34.0)
MCHC: 30.5 g/dL (ref 30.0–36.0)
MCV: 93.5 fL (ref 78.0–100.0)
PLATELETS: 151 10*3/uL (ref 150–400)
RBC: 3.82 MIL/uL — AB (ref 3.87–5.11)
RDW: 13.6 % (ref 11.5–15.5)
WBC: 10.6 10*3/uL — ABNORMAL HIGH (ref 4.0–10.5)

## 2015-04-17 LAB — SODIUM
SODIUM: 159 mmol/L — AB (ref 135–145)
Sodium: 158 mmol/L — ABNORMAL HIGH (ref 135–145)
Sodium: 155 mmol/L — ABNORMAL HIGH (ref 135–145)

## 2015-04-17 LAB — PHOSPHORUS: Phosphorus: 3.3 mg/dL (ref 2.5–4.6)

## 2015-04-17 LAB — MAGNESIUM: MAGNESIUM: 2.1 mg/dL (ref 1.7–2.4)

## 2015-04-17 MED ORDER — CLONIDINE HCL 0.3 MG PO TABS
0.3000 mg | ORAL_TABLET | Freq: Two times a day (BID) | ORAL | Status: DC
  Administered 2015-04-18: 0.3 mg via ORAL
  Filled 2015-04-17: qty 1
  Filled 2015-04-17: qty 3
  Filled 2015-04-17 (×2): qty 1

## 2015-04-17 MED ORDER — HEPARIN SODIUM (PORCINE) 5000 UNIT/ML IJ SOLN
5000.0000 [IU] | Freq: Three times a day (TID) | INTRAMUSCULAR | Status: DC
  Administered 2015-04-17 – 2015-04-27 (×29): 5000 [IU] via SUBCUTANEOUS
  Filled 2015-04-17 (×31): qty 1

## 2015-04-17 MED ORDER — LISINOPRIL 20 MG PO TABS
20.0000 mg | ORAL_TABLET | Freq: Two times a day (BID) | ORAL | Status: DC
  Administered 2015-04-17 – 2015-04-18 (×3): 20 mg
  Filled 2015-04-17 (×3): qty 1

## 2015-04-17 MED ORDER — POTASSIUM CHLORIDE 20 MEQ/15ML (10%) PO SOLN
40.0000 meq | Freq: Once | ORAL | Status: AC
  Administered 2015-04-17: 40 meq
  Filled 2015-04-17: qty 30

## 2015-04-17 NOTE — Progress Notes (Signed)
Placed pt back on full support for the evening

## 2015-04-17 NOTE — Progress Notes (Signed)
Patient transported to CT and returned to 3M10 without incident.

## 2015-04-17 NOTE — Progress Notes (Signed)
PULMONARY / CRITICAL CARE MEDICINE   Name: Kerry [name] MRN: [id number] DOB: 02/16/1956    ADMISSION DATE:  04/11/2015 CONSULTATION DATE:  04/11/2015  REFERRING MD : Neurology  CHIEF COMPLAINT:  Rt sided weakness  INITIAL PRESENTATION:  59 y/o female smoker presented with Rt sided weakness and difficulty with speech from LT MCA CVA s/p limited dosing of tPA and embolectomy with stent.  Remained on vent post-procedure and PCCM consulted.  STUDIES:  10/19 CT head >> Lt MCA infarct 10/20 ECHO >> EF 55 to 60%  SIGNIFICANT EVENTS: 10/19 Admit, limited tPA, embolectomy with stenting 10/20 start 3% NS 10/21 Bradycardia from propofol, fever  SUBJECTIVE:  Required restraints overnight No significant clinical change.   VITAL SIGNS: Temp:  [99.1 F (37.3 C)-100.6 F (38.1 C)] 99.5 F (37.5 C) (10/25 0900) Pulse Rate:  [46-107] 70 (10/25 0900) Resp:  [14-32] 17 (10/25 0900) BP: (97-185)/(38-97) 158/67 mmHg (10/25 0900) SpO2:  [99 %-100 %] 100 % (10/25 0900) FiO2 (%):  [30 %-40 %] 30 % (10/25 0801) Weight:  [92.3 kg (203 lb 7.8 oz)] 92.3 kg (203 lb 7.8 oz) (10/25 0453) HEMODYNAMICS: CVP:  [5 mmHg-15 mmHg] 10 mmHg VENTILATOR SETTINGS: Vent Mode:  [-] PSV;CPAP FiO2 (%):  [30 %-40 %] 30 % Set Rate:  [16 bmp] 16 bmp Vt Set:  [480 mL] 480 mL PEEP:  [5 cmH20] 5 cmH20 Pressure Support:  [5 cmH20] 5 cmH20 Plateau Pressure:  [8 cmH20-15 cmH20] 8 cmH20 INTAKE / OUTPUT:  Intake/Output Summary (Last 24 hours) at 04/17/15 0954 Last data filed at 04/17/15 0800  Gross per 24 hour  Intake 1043.17 ml  Output   1900 ml  Net -856.83 ml    PHYSICAL EXAMINATION: General: obese female in NAD, sedated Neuro:  Moves Lt side HEENT:  ETT in place, MM pink/moist  Cardiovascular:  Regular, no murmur Lungs:  No wheeze Abdomen:  Soft, non tender Musculoskeletal:  No edema Skin:  No rashes  LABS:  CBC  Recent Labs Lab 04/14/15 0515 04/15/15 0310 04/17/15 0330  WBC 10.9* 10.2  10.6*  HGB 11.5* 11.1* 10.9*  HCT 36.1 35.8* 35.7*  PLT 150 143* 151   Coag's  Recent Labs Lab 04/11/15 0905  APTT 24  INR 1.00   BMET  Recent Labs Lab 04/16/15 0735 04/16/15 0959 04/16/15 1515 04/16/15 2125 04/17/15 0330  NA 158* 159* 158* 157* 159*  K 2.7* 2.7*  --   --  3.4*  CL 125* 122*  --   --  125*  CO2 29 29  --   --  28  BUN 20 19  --   --  20  CREATININE 0.43* 0.44  --   --  0.41*  GLUCOSE 200* 202*  --   --  209*     Electrolytes  Recent Labs Lab 04/14/15 0515  04/15/15 0310 04/16/15 0735 04/16/15 0959 04/17/15 0330  CALCIUM 8.5*  < > 8.8* 8.6* 9.0 8.8*  MG 1.9  --  2.0  --   --  2.1  PHOS  --   --   --   --   --  3.3  < > = values in this interval not displayed. ABG  Recent Labs Lab 04/11/15 1545  PHART 7.408  PCO2ART 32.5*  PO2ART 368*   Liver Enzymes  Recent Labs Lab 04/11/15 0905 04/13/15 0240  AST 15 21  ALT 16 22  ALKPHOS 66 52  BILITOT 0.4 0.6  ALBUMIN 3.6 2.6*   Glucose  Recent Labs Lab 04/16/15 1127 04/16/15 1537 04/16/15 2007 04/16/15 2327 04/17/15 0406 04/17/15 0747  GLUCAP 241* 212* 180* 286* 178* 123*    Imaging Ct Head Wo Contrast  04/17/2015  CLINICAL DATA:  Follow-up CVA.  Subsequent encounter. EXAM: CT HEAD WITHOUT CONTRAST TECHNIQUE: Contiguous axial images were obtained from the base of the skull through the vertex without intravenous contrast. COMPARISON:  CT of the head performed 04/13/2015 FINDINGS: There has been mild interval evolution of the large left MCA territory infarct, with associated edema. There is mildly more prominent involvement of the left basal ganglia. There is 2-3 mm of rightward midline shift, with partial effacement of the left lateral ventricle. There is no definite evidence of hydrocephalus. Scattered foci of associated hemorrhage are perhaps slightly more diffuse than on the prior study. Mild periventricular white matter change likely reflects small vessel ischemic  microangiopathy. The right basal ganglia is grossly unremarkable. The cerebellar hemispheres, brainstem and fourth ventricle are within normal limits. There is no evidence of fracture; visualized osseous structures are unremarkable in appearance. The orbits are within normal limits. The paranasal sinuses and mastoid air cells are well-aerated. No significant soft tissue abnormalities are seen. IMPRESSION: 1. Mild interval evolution of large left MCA territory infarct, with associated edema. Slight rightward midline shift, measuring 2-3 mm, with partial effacement of the left lateral ventricle. Scattered foci of associated hemorrhage are perhaps slightly more diffuse than on the prior study. 2. Mild small vessel ischemic microangiopathy. Electronically Signed   By: Garald Balding M.D.   On: 04/17/2015 01:45     ASSESSMENT / PLAN: NEUROLOGIC A:   Lt MCA infarct s/p limited tPA and embolectomy with stenting. Cytotoxic edema. P:   RASS goal: 0 Changed to fentanyl gtt and prn versed 10/21 Therapeutic hypernatremia Serial neuro exams Defer further neuro imaging to Neuro   PULMONARY ETT 10/19 >> A: Compromised airway in setting of CVA. Tobacco abuse. P:   Pressure support wean as tolerated >> vent mechanics ok but mental status does not support extubation at this point Family will want trach > will perform 10/26 pm F/u CXR  CARDIOVASCULAR Rt IJ CVL 10/20 >> A:  Malignant HTN. Hx of CAD, HTN, PAD, HLD. Bradycardia 10/20 from diprivan. P:  Continue ASA, pravachol, norvasc  Increase catapres 10/24,  Restart lisinopril 10/24 at half home dose > 10mg  qd, probably uptitrate 10/26 Hold outpt  toprol Wean nicardipine to off if able Goal SBP < 160  RENAL A:   Medically induced hypernatremia. Hypokalemia. P:   Goal Na 150 to 155 per neurology Replace electrolytes as indicated  GASTROINTESTINAL A:   Nutrition. P:   Tube feeds  Protonix for SUP  HEMATOLOGIC A:  No acute  issues. P:  F/u CBC SCD's for DVT prevention  INFECTIOUS A:   No evidence active infxn P:   If fever recurs, then send cultures and add abx  ENDOCRINE A:  Hx of DM.   P:   SSI Hold outpt glipizide    Baltazar Apo, MD, PhD 04/17/2015, 9:54 AM [facility] [phone] or if no answer [phone]

## 2015-04-17 NOTE — Progress Notes (Signed)
STROKE TEAM PROGRESS NOTE   SUBJECTIVE (INTERVAL HISTORY) Sons are at bedside. The patient is still intubated on fentanyl. Barely open eyes on sternal rub. HR stable at 60s. Repeat CT overnight showed stable large left MCA malignant infarct. Na 159 today and off 3% saline. Updated family at bedside and discussed with Dr. Lamonte Sakai, plan for trach and peg tomorrow.  OBJECTIVE Temp:  [99.1 F (37.3 C)-100.6 F (38.1 C)] 99.5 F (37.5 C) (10/25 0900) Pulse Rate:  [47-115] 115 (10/25 1115) Cardiac Rhythm:  [-] Normal sinus rhythm (10/25 0800) Resp:  [14-32] 18 (10/25 1115) BP: (97-185)/(38-97) 131/54 mmHg (10/25 1115) SpO2:  [100 %] 100 % (10/25 1115) FiO2 (%):  [30 %-40 %] 30 % (10/25 1115) Weight:  [203 lb 7.8 oz (92.3 kg)] 203 lb 7.8 oz (92.3 kg) (10/25 0453)  CBC:   Recent Labs Lab 04/11/15 0905  04/13/15 0723  04/15/15 0310 04/17/15 0330  WBC 9.4  --  10.6*  < > 10.2 10.6*  NEUTROABS 6.4  --  7.7  --   --   --   HGB 15.2*  < > 12.0  < > 11.1* 10.9*  HCT 44.8  < > 36.4  < > 35.8* 35.7*  MCV 86.3  --  90.3  < > 92.0 93.5  PLT 192  --  PLATELET CLUMPS NOTED ON SMEAR, COUNT APPEARS ADEQUATE  < > 143* 151  < > = values in this interval not displayed.  Basic Metabolic Panel:   Recent Labs Lab 04/15/15 0310  04/16/15 0959  04/17/15 0330 04/17/15 0930  NA 154*  < > 159*  < > 159* 159*  K 3.1*  < > 2.7*  --  3.4*  --   CL 120*  < > 122*  --  125*  --   CO2 29  < > 29  --  28  --   GLUCOSE 244*  < > 202*  --  209*  --   BUN 24*  < > 19  --  20  --   CREATININE 0.48  < > 0.44  --  0.41*  --   CALCIUM 8.8*  < > 9.0  --  8.8*  --   MG 2.0  --   --   --  2.1  --   PHOS  --   --   --   --  3.3  --   < > = values in this interval not displayed.  Lipid Panel:     Component Value Date/Time   CHOL 208* 04/12/2015 0315   TRIG 231* 04/12/2015 0315   HDL 29* 04/12/2015 0315   CHOLHDL 7.2 04/12/2015 0315   VLDL 46* 04/12/2015 0315   LDLCALC 133* 04/12/2015 0315   HgbA1c:  Lab  Results  Component Value Date   HGBA1C 11.4* 04/12/2015   Urine Drug Screen: No results found for: LABOPIA, COCAINSCRNUR, LABBENZ, AMPHETMU, THCU, LABBARB    IMAGING I have personally reviewed the radiological images below and agree with the radiology interpretations.  Ct Head Wo Contrast  04/17/2015  1. Mild interval evolution of large left MCA territory infarct, with associated edema. Slight rightward midline shift, measuring 2-3 mm, with partial effacement of the left lateral ventricle. Scattered foci of associated hemorrhage are perhaps slightly more diffuse than on the prior study. 2. Mild small vessel ischemic microangiopathy.  04/12/2105  1. Large acute left MCA territory infarct with resolving associated hemorrhage. Similar local mass effect without midline shift. 2. No new  intracranial process.  04/12/2015  Acute large LEFT MCA territory infarct with similar patchy areas of hemorrhage. Local mass effect without midline shift. Mildly dense intracranial vessels most compatible with residual contrast.   04/11/2015  Acute hemorrhage in the left temporal lobe. This may be within the sylvian fissure rather than the insular cortex. There are small gas bubbles in the area of hemorrhage which may be extra vascular gas in the subarachnoid space. This is likely related to the procedure and micro perforation of a vessel. Acute infarct left temporal lobe and possibly left basal ganglia. There is local edema without midline shift   04/11/2015  Slightly more conspicuous loss of gray-white differentiation affecting the left temporal lobe and frontoparietal regions in the left basal ganglia consistent with acute left MCA infarction. Aspects score difficult because of some motion, but estimated at 5 or 6.   04/11/2015  CT findings consistent with early left MCA infarct with a dense left MCA sign. No mass effect or hemorrhage.   2D Echocardiogram   - Left ventricle: The cavity size was normal.  Systolic function wasnormal. The estimated ejection fraction was in the range of 55%to 60%. Wall motion was normal; there were no regional wallmotion abnormalities. - Aortic valve: Valve area (VTI): 2.24 cm^2. Valve area (Vmax): 2cm^2. Valve area (Vmean): 2.04 cm^2. - Atrial septum: No defect or patent foramen ovale was identified.    Physical Exam  General: The patient is intubated, barely open eyes on pain stimulation. On fentanyl.  Respiratory: Clear lung fields  Cardiovascular: Regular rate and rhythm  Abdomen: Soft, nontender, positive bowel sounds  Skin: 1+ edema in the legs noted..  Neurologic Exam Intubated, on fentanyl, barely open eyes on pain stimulation. Pupil left 3.86mm and right 2.63mm, both sluggish to light, difference remained same at light or dark. Doll's eyes present. Positive corneal and gag. Breathing spontaneously. Left UE localized to pain, right UE extension posture on pain. Left LE against gravity, RLE 2/5 on pain stimulation. Reflex 1+ and right babinski positive.  ASSESSMENT/PLAN Kerry [name] is a 59 y.o. female with history of diabetes mellitus, hypertension, hyperlipidemia and fibromyalgia presenting with new onset speech difficulty and right hemiparesis. She received IV t-PA (total 50 mg, 19 mg not received) due to elevated BP. 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   Resultant  Respiratory failure, right hemiparesis, cerebral edema  Repeat CT head large L MCA infarct, mild midline shift  2D Echo  EF 55-60%, no source of embolus  LDL 133  HgbA1c 11.4    for VTE prophylaxis Diet NPO time specified  No antithrombotic prior to admission.now  on aspirin suppository.   Ongoing aggressive stroke risk factor management  Plan for PEG and trach in am   Therapy recommendations:  pending   Disposition:  pending   Cytotoxic cerebral edema  CCM following  Started 3% saline 04/13/2015, off 04/16/15  Na 135 prior to, now 152->159.   On NS @ 40cc  Na monitoring  Repeat CT showed mild midline shift  Carotid stenosis  L ICA occlusion  S/p acute stent placement (Dr. Arizona Constable at [facility])  Start on aspirin 300 mg suppository  Acute respiratory Failure  Intubated for procedure  Unable to protect  airway  CCM managing  Malignant Hypertension  BP 179/82 on arrival, as high as 213/97  On cardene drip   On norvasc, lisinopril and clonidine now  SBP goal < 160 given hemorrhage  Hyperlipidemia  Home meds:  pravachol 40  LDL 133, goal < 70  Resumed home meds  Continue statin at discharge  Bradycardia - likely due to carotid stent placement  Down to the 30s both pre-IR and post CL placement  BB not recommended  Cardiology has seen, external pacemaker placed and then removed 04/16/15  HR stable  Diabetes type II  HgbA1c 11.4 , goal < 7.0  On lantus 30 u daily PTA, diabetic RN recommends 50% home lantus dose. Ordered.  SSI  CBG monitoring  Other Stroke Risk Factors  Cigarette smoker, quit smoking  ETOH use  Obesity, Body mass index is 32.86 kg/(m^2).   Coronary artery disease  Noncompliance   L femoral bypass  Other Active Problems  Hx breast cancer  AKI - Cr 0.5  Hospital day # 6  This patient is critically ill due to left MCA malignant infarct, left ICA occlusion s/p stenting, respiratory failure needing intubation, bradycardia after stent and at significant risk of neurological worsening, death form brain herniation, heart failure, arrhythmia, hemorrhagic transformation. This patient's care requires constant monitoring of vital signs, hemodynamics, respiratory and cardiac monitoring,  review of multiple databases, neurological assessment, discussion with family, other specialists and medical decision making of high complexity. Discussed with pt sons and Dr. Lamonte Sakai at bedside. Updated family about pt condition, answered their questions. I spent 45 minutes of neurocritical care time in the care of this patient.   Rosalin Hawking, MD PhD Stroke Neurology 04/17/2015 3:19 PM        To contact Stroke Continuity provider, please refer to [email or web address] After hours, contact General Neurology

## 2015-04-17 NOTE — Progress Notes (Signed)
PT Cancellation Note  Patient Details Name: [name] MRN: [id number] DOB: [birth year]   Cancelled Treatment:    Reason Eval/Treat Not Completed: Patient not medically ready (Pt on strict bedrest, discussed with MD who stated he would advise as to when PT is appropriate.  Left pager number with nurse who was going to check with MD when he finished assessment to advise PT/OT regarding if pt is medically ready.  Have not received return call yet.) Will check back as able.  Thanks.  Irwin Brakeman F 04/17/2015, 11:20 AM  [facility] [phone] (pager)

## 2015-04-17 NOTE — Progress Notes (Signed)
Pt remains on Cardene drip; not following commands.  Plan for tracheostomy and PEG placement on 04/18/15, per family's wishes.      Reinaldo Raddle, RN, BSN  Trauma/Neuro ICU Case Manager [phone]

## 2015-04-17 NOTE — Progress Notes (Signed)
OT Cancellation Note  Patient Details Name: [name] MRN: [id number] DOB: Jan 15, 1956   Cancelled Treatment:    Reason Eval/Treat Not Completed: Patient not medically ready - Pt remains on strict bedrest.  Will reattempt.   Darlina Rumpf Obion, OTR/L [phone]  04/17/2015, 12:57 PM

## 2015-04-17 NOTE — Progress Notes (Signed)
No significant bradycardia noted.  Please call if recurs.

## 2015-04-17 NOTE — Progress Notes (Signed)
Plan for PEG tomorrow.  I spoke in detail with the son and daughter-in-law about the procedure.  Will plan for about 1:00 PM tomorrow.  Kathryne Eriksson. Dahlia Bailiff, MD, [place] [phone] Trauma Surgeon

## 2015-04-17 NOTE — Progress Notes (Signed)
[facility] Progress Note Patient Name: MIKIYA NEBERGALL DOB: 1955/08/05 MRN: [id number]   Date of Service  04/17/2015  HPI/Events of Note    eICU Interventions  Hypokalemia, repleted      Intervention Category Intermediate Interventions: Electrolyte abnormality - evaluation and management  Simonne Maffucci 04/17/2015, 5:20 AM

## 2015-04-18 ENCOUNTER — Encounter (HOSPITAL_COMMUNITY)
Admission: EM | Disposition: A | Discharge status: Other Acute Inpatient Hospital | Payer: Self-pay | Source: Home / Self Care | Attending: Neurology

## 2015-04-18 ENCOUNTER — Inpatient Hospital Stay (HOSPITAL_COMMUNITY): Payer: 59

## 2015-04-18 DIAGNOSIS — E785 Hyperlipidemia, unspecified: Secondary | ICD-10-CM | POA: Insufficient documentation

## 2015-04-18 DIAGNOSIS — I6300 Cerebral infarction due to thrombosis of unspecified precerebral artery: Secondary | ICD-10-CM

## 2015-04-18 DIAGNOSIS — I455 Other specified heart block: Secondary | ICD-10-CM

## 2015-04-18 DIAGNOSIS — J988 Other specified respiratory disorders: Secondary | ICD-10-CM | POA: Insufficient documentation

## 2015-04-18 DIAGNOSIS — G936 Cerebral edema: Secondary | ICD-10-CM

## 2015-04-18 DIAGNOSIS — I619 Nontraumatic intracerebral hemorrhage, unspecified: Secondary | ICD-10-CM

## 2015-04-18 DIAGNOSIS — E1159 Type 2 diabetes mellitus with other circulatory complications: Secondary | ICD-10-CM | POA: Insufficient documentation

## 2015-04-18 HISTORY — PX: PEG PLACEMENT: SHX5437

## 2015-04-18 HISTORY — PX: ESOPHAGOGASTRODUODENOSCOPY: SHX5428

## 2015-04-18 LAB — SODIUM
SODIUM: 159 mmol/L — AB (ref 135–145)
Sodium: 161 mmol/L (ref 135–145)
Sodium: 154 mmol/L — ABNORMAL HIGH (ref 135–145)

## 2015-04-18 LAB — BASIC METABOLIC PANEL
ANION GAP: 13 (ref 5–15)
BUN: 26 mg/dL — AB (ref 6–20)
CALCIUM: 8.6 mg/dL — AB (ref 8.9–10.3)
CO2: 27 mmol/L (ref 22–32)
Chloride: 117 mmol/L — ABNORMAL HIGH (ref 101–111)
Creatinine, Ser: 0.5 mg/dL (ref 0.44–1.00)
GFR calc Af Amer: >60 mL/min (ref >60)
GLUCOSE: 237 mg/dL — AB (ref 65–99)
Potassium: 3.2 mmol/L — ABNORMAL LOW (ref 3.5–5.1)
SODIUM: 157 mmol/L — AB (ref 135–145)

## 2015-04-18 LAB — GLUCOSE, CAPILLARY
GLUCOSE-CAPILLARY: 134 mg/dL — AB (ref 65–99)
GLUCOSE-CAPILLARY: 136 mg/dL — AB (ref 65–99)
GLUCOSE-CAPILLARY: 146 mg/dL — AB (ref 65–99)
GLUCOSE-CAPILLARY: 131 mg/dL — AB (ref 65–99)
Glucose-Capillary: 129 mg/dL — ABNORMAL HIGH (ref 65–99)
Glucose-Capillary: 206 mg/dL — ABNORMAL HIGH (ref 65–99)

## 2015-04-18 SURGERY — INSERTION, PEG TUBE
Anesthesia: Moderate Sedation

## 2015-04-18 MED ORDER — POTASSIUM CHLORIDE 20 MEQ/15ML (10%) PO SOLN
40.0000 meq | Freq: Once | ORAL | Status: AC
  Administered 2015-04-18: 40 meq
  Filled 2015-04-18: qty 30

## 2015-04-18 MED ORDER — CLONIDINE HCL 0.1 MG PO TABS
0.1000 mg | ORAL_TABLET | Freq: Two times a day (BID) | ORAL | Status: DC
  Administered 2015-04-18 (×2): 0.1 mg via ORAL
  Filled 2015-04-18 (×2): qty 1

## 2015-04-18 MED ORDER — MIDAZOLAM HCL 2 MG/2ML IJ SOLN
4.0000 mg | Freq: Once | INTRAMUSCULAR | Status: AC
  Administered 2015-04-18: 4 mg via INTRAVENOUS
  Filled 2015-04-18: qty 4

## 2015-04-18 MED ORDER — LISINOPRIL 10 MG PO TABS: 10.0000 mg | ORAL_TABLET | Freq: Two times a day (BID) | ORAL | Status: DC

## 2015-04-18 MED ORDER — SODIUM CHLORIDE 0.45 % IV SOLN
INTRAVENOUS | Status: DC
  Administered 2015-04-18 – 2015-04-19 (×2): via INTRAVENOUS

## 2015-04-18 MED ORDER — FENTANYL CITRATE (PF) 100 MCG/2ML IJ SOLN: 200.0000 ug | Freq: Once | INTRAMUSCULAR | Status: DC

## 2015-04-18 MED ORDER — PROPOFOL 500 MG/50ML IV EMUL
5.0000 ug/kg/min | Freq: Once | INTRAVENOUS | Status: DC
  Filled 2015-04-18: qty 50

## 2015-04-18 MED ORDER — VECURONIUM BROMIDE 10 MG IV SOLR
10.0000 mg | Freq: Once | INTRAVENOUS | Status: AC
  Administered 2015-04-18: 10 mg via INTRAVENOUS
  Filled 2015-04-18 (×2): qty 10

## 2015-04-18 MED ORDER — ATROPINE SULFATE 1 MG/ML IJ SOLN
INTRAMUSCULAR | Status: AC
  Filled 2015-04-18: qty 1

## 2015-04-18 MED ORDER — ETOMIDATE 2 MG/ML IV SOLN
40.0000 mg | Freq: Once | INTRAVENOUS | Status: AC
  Administered 2015-04-18: 20 mg via INTRAVENOUS
  Filled 2015-04-18: qty 20

## 2015-04-18 SURGICAL SUPPLY — 1 items: PEG ×2 IMPLANT

## 2015-04-18 NOTE — Progress Notes (Signed)
Dr. Angelica Pou with cardiology called and notified of pt's bradycardic event to 28bpm. Discussed pt's course of care, previous cardiac events with interventions, stroke parameters, assessment and vitals. Instructed to continue to monitor closely. Notify MD if pt's sustains HR <35bpm and/or SBP<90.

## 2015-04-18 NOTE — Progress Notes (Signed)
~  0252: Noted pt to brady down to 20'sbpm. Pt assessed, HR to increase to 405bpm.  Kristine Royal, Warren Lacy notified of event. Dr. Spero Curb called, updated MD on vitals and previous events. Will continue to monitor. Advised to call Cardiology.

## 2015-04-18 NOTE — Progress Notes (Signed)
[facility] Progress Note Patient Name: Kerry [name] DOB: 1956-04-26 MRN: [id number]   Date of Service  04/18/2015  HPI/Events of Note    eICU Interventions  Hypokalemia, repleted      Intervention Category Intermediate Interventions: Electrolyte abnormality - evaluation and management  Simonne Maffucci 04/18/2015, 5:11 AM

## 2015-04-18 NOTE — Procedures (Signed)
Percutaneous Tracheostomy Placement  Consent from family.  Patient sedated, paralyzed and position.  Placed on 100% FiO2 and RR matched.  Area cleaned and draped.  Lidocaine/epi injected.  Skin incision done followed by blunt dissection.  Trachea palpated then punctured, catheter passed and visualized bronchoscopically.  Wire placed and visualized.  Catheter removed.  Airway then crushed and dilated.  Size 6 cuffed shiley trach placed and visualized bronchoscopically well above carina.  Good volume returns.  Patient tolerated the procedure well without complications.  Minimal blood loss.  CXR ordered and pending.  Wesam G. Yacoub, M.D. [facility]. Pager: [phone]. After hours pager: [phone]. 

## 2015-04-18 NOTE — Procedures (Signed)
Video Bronchoscopy Procedure Note  Date of Operation: 04/18/2015  Pre-op Diagnosis: Chronic respiratory Failure  Post-op Diagnosis: Same  Surgeon: Baltazar Apo  Assistants: none  Anesthesia: conscious sedation, moderate sedation  Operation: Flexible video fiberoptic bronchoscopy  Estimated Blood Loss: None  Complications: none noted  Indications and History: Kerry [name] is 59 y.o. with history of CVA and ventilator dependence.  Recommendation was to perform video fiberoptic bronchoscopy to facilitate percutaneous tracheostomy. Please refer also to trach procedure note for details. The risks, benefits, complications, treatment options and expected outcomes were discussed with the patient.  The possibilities of pneumothorax, pneumonia, reaction to medication, pulmonary aspiration, perforation of a viscus, bleeding, failure to diagnose a condition and creating a complication requiring transfusion or operation were discussed with the patient who freely signed the consent.    Description of Procedure: The patient was seen in the Preoperative Area, was examined and was deemed appropriate to proceed.  The patient was identified as [name] and the procedure verified as Flexible Video Fiberoptic Bronchoscopy.  A Time Out was held and the above information confirmed.   The video fiberoptic bronchoscope was introduced via the ETT and the ETT was withdrawn to allow safe cannulation of the trachea for trach placement. There was no evidence of injury to the posterior tracheal wall with either the needle or the guidewire. After the trach tube was inserted there was no significant bleeding noted from above the entry site.  The ETT was removed and the bronchoscopy was continued via the new trach tube. No distal bleeding was seen. All airways were inspected. There were no endobronchial lesions. Thick yellow secretions were suctioned from the B LL airways and sent for culture.   The  patient tolerated the procedure well. The bronchoscope was removed. There were no obvious complications.   Samples: Tracheal aspirate for bacterial culture.   Plans:  We will ventilate her via tracheostomy.  Follow tracheal aspirate cultures.    Baltazar Apo, MD, PhD 04/18/2015, 2:42 PM  Pulmonary and Critical Care [phone] or if no answer [phone]

## 2015-04-18 NOTE — Progress Notes (Signed)
~  168mL Fentanyl wasted in sink due to expiration, witnessed by [employer]

## 2015-04-18 NOTE — Progress Notes (Signed)
OT Cancellation Note  Patient Details Name: Kerry [name] MRN: [id number] DOB: May 28, 1956   Cancelled Treatment:    Reason Eval/Treat Not Completed: Patient not medically ready. Remains on strict bedrest.  [name] 04/18/2015, 8:10 AM

## 2015-04-18 NOTE — Progress Notes (Signed)
PULMONARY / CRITICAL CARE MEDICINE   Name: Kerry [name] MRN: [id number] DOB: March 15, 1956    ADMISSION DATE:  04/11/2015 CONSULTATION DATE:  04/11/2015  REFERRING MD : Neurology  CHIEF COMPLAINT:  Rt sided weakness  INITIAL PRESENTATION:  59 y/o female smoker presented with Rt sided weakness and difficulty with speech from LT MCA CVA s/p limited dosing of tPA and embolectomy with stent.  Remained on vent post-procedure and PCCM consulted.  STUDIES:  10/19 CT head >> Lt MCA infarct 10/20 ECHO >> EF 55 to 60%  SIGNIFICANT EVENTS: 10/19 Admit, limited tPA, embolectomy with stenting 10/20 start 3% NS 10/21 Bradycardia from propofol, fever 10/26 trach and peg  SUBJECTIVE:  Required restraints overnight No significant clinical change.   VITAL SIGNS: Temp:  [97.9 F (36.6 C)-100.4 F (38 C)] 99.9 F (37.7 C) (10/26 0900) Pulse Rate:  [31-115] 49 (10/26 0900) Resp:  [14-26] 18 (10/26 0900) BP: (106-162)/(45-97) 141/55 mmHg (10/26 0900) SpO2:  [98 %-100 %] 100 % (10/26 0900) FiO2 (%):  [30 %] 30 % (10/26 0830) Weight:  [201 lb 15.1 oz (91.6 kg)] 201 lb 15.1 oz (91.6 kg) (10/26 0445) HEMODYNAMICS: CVP:  [8 mmHg-30 mmHg] 8 mmHg VENTILATOR SETTINGS: Vent Mode:  [-] PSV FiO2 (%):  [30 %] 30 % Set Rate:  [16 bmp] 16 bmp Vt Set:  [480 mL] 480 mL PEEP:  [5 cmH20] 5 cmH20 Pressure Support:  [8 cmH20-10 cmH20] 10 cmH20 Plateau Pressure:  [16 cmH20] 16 cmH20 INTAKE / OUTPUT:  Intake/Output Summary (Last 24 hours) at 04/18/15 1001 Last data filed at 04/18/15 0900  Gross per 24 hour  Intake   1619 ml  Output   1520 ml  Net     99 ml    PHYSICAL EXAMINATION: General: obese female in NAD, sedated Neuro:  Moves Lt side. spont moves left arm HEENT:  ETT in place, MM pink/moist  Cardiovascular:  Regular, no murmur Lungs:  No wheeze Abdomen:  Soft, non tender Musculoskeletal:  No edema Skin:  No rashes  LABS:  CBC  Recent Labs Lab 04/14/15 0515 04/15/15 0310  04/17/15 0330  WBC 10.9* 10.2 10.6*  HGB 11.5* 11.1* 10.9*  HCT 36.1 35.8* 35.7*  PLT 150 143* 151   Coag's No results for input(s): APTT, INR in the last 168 hours. BMET  Recent Labs Lab 04/16/15 0959  04/17/15 0330  04/17/15 1530 04/17/15 2225 04/18/15 0330  NA 159*  < > 159*  < > 158* 155* 157*  K 2.7*  --  3.4*  --   --   --  3.2*  CL 122*  --  125*  --   --   --  117*  CO2 29  --  28  --   --   --  27  BUN 19  --  20  --   --   --  26*  CREATININE 0.44  --  0.41*  --   --   --  0.50  GLUCOSE 202*  --  209*  --   --   --  237*  < > = values in this interval not displayed.   Electrolytes  Recent Labs Lab 04/14/15 0515  04/15/15 0310  04/16/15 0959 04/17/15 0330 04/18/15 0330  CALCIUM 8.5*  < > 8.8*  < > 9.0 8.8* 8.6*  MG 1.9  --  2.0  --   --  2.1  --   PHOS  --   --   --   --   --  3.3  --   < > = values in this interval not displayed. ABG  Recent Labs Lab 04/11/15 1545  PHART 7.408  PCO2ART 32.5*  PO2ART 368*   Liver Enzymes  Recent Labs Lab 04/13/15 0240  AST 21  ALT 22  ALKPHOS 52  BILITOT 0.6  ALBUMIN 2.6*   Glucose  Recent Labs Lab 04/17/15 1147 04/17/15 1551 04/17/15 1945 04/17/15 2346 04/18/15 0352 04/18/15 0740  GLUCAP 238* 129* 107* 189* 206* 134*    Imaging Dg Chest Port 1 View  04/17/2015  CLINICAL DATA:  Hypoxia EXAM: PORTABLE CHEST 1 VIEW COMPARISON:  April 15, 2015 FINDINGS: Endotracheal tube tip is 7.7 cm above the carina. Central catheter tip is in the superior vena cava just superior to the cavoatrial junction. Nasogastric tube tip and side port are below the diaphragm. No pneumothorax. There is mild left base atelectasis. Lungs elsewhere clear. Heart is upper normal in size with pulmonary vascularity within normal limits. There are clips in the left inferior axillary region. IMPRESSION: Tube and catheter positions as described without pneumothorax. Mild left base atelectasis. Lungs elsewhere clear. Electronically  Signed   By: Lowella Grip III M.D.   On: 04/17/2015 10:39     ASSESSMENT / PLAN: NEUROLOGIC A:   Lt MCA infarct s/p limited tPA and embolectomy with stenting. Cytotoxic edema. P:   RASS goal: 0 Changed to fentanyl gtt and prn versed 10/21 Therapeutic hypernatremia Serial neuro exams Defer further neuro imaging to Neuro   PULMONARY ETT 10/19 >> A: Compromised airway in setting of CVA. Tobacco abuse. P:   Pressure support wean as tolerated >> vent mechanics ok but mental status does not support extubation at this point Family will want trach > will perform 10/26 pm F/u CXR  CARDIOVASCULAR Rt IJ CVL 10/20 >> A:  Malignant HTN. Hx of CAD, HTN, PAD, HLD. Bradycardia 10/20 from diprivan. P:  Continue ASA, pravachol, norvasc, catapress, lisinopril Hold outpt  toprol Wean nicardipine to off if able Goal SBP < 160  RENAL A:   Medically induced hypernatremia. Hypokalemia.  Recent Labs Lab 04/17/15 1530 04/17/15 2225 04/18/15 0330  NA 158* 155* 157*    Recent Labs Lab 04/16/15 0959 04/17/15 0330 04/18/15 0330  K 2.7* 3.4* 3.2*   P:   Goal Na 150 to 155 per neurology Replace electrolytes as indicated  GASTROINTESTINAL A:   Nutrition. P:   Tube feeds  Protonix for SUP  HEMATOLOGIC A:  No acute issues. P:  F/u CBC SCD's for DVT prevention  INFECTIOUS A:   No evidence active infxn P:   If fever recurs, then send cultures and add abx  ENDOCRINE CBG (last 3)   Recent Labs  04/17/15 2346 04/18/15 0352 04/18/15 0740  GLUCAP 189* 206* 134*     A:  Hx of DM.   P:   SSI Hold outpt glipizide    Richardson Landry Minor ACNP Mary[facility] Shape PCCM Pager [phone] till 3 pm If no answer page [phone] 04/18/2015, 10:01 AM  Attending Note:  I have examined patient, reviewed labs, studies and notes. I have discussed the case with S Minor, and I agree with the data and plans as amended above.  Baltazar Apo, MD, PhD 04/18/2015, 11:55 AM [place]  Pulmonary and Critical Care [phone] or if no answer [phone]

## 2015-04-18 NOTE — Procedures (Signed)
Bedside Tracheostomy Insertion Procedure Note   Patient Details:   Name: Kerry [name] DOB: April 05, 1956 MRN: [id number]  Procedure: Tracheostomy  Pre Procedure Assessment: ET Tube Size: 7.5 ET Tube secured at lip (cm): 20  Bite block in place: No Breath Sounds: Clear and Diminished  Post Procedure Assessment: BP 129/69 mmHg  Pulse 48  Temp(Src) 100.2 F (37.9 C) (Core (Comment))  Resp 16  Ht 5\' 6"  (1.676 m)  Wt 201 lb 15.1 oz (91.6 kg)  BMI 32.61 kg/m2  SpO2 100% O2 sats: stable throughout Complications: No apparent complications Patient did tolerate procedure well Tracheostomy Brand:Shiley Tracheostomy Style:Cuffed Tracheostomy Size: 6.0 Tracheostomy Secured WIO:[id number] Tracheostomy Placement Confirmation:Trach cuff visualized and in place    Phillis Knack [facility] 04/18/2015, 2:44 PM

## 2015-04-18 NOTE — Progress Notes (Signed)
STROKE TEAM PROGRESS NOTE   SUBJECTIVE (INTERVAL HISTORY) Son and daughter in law are at bedside, along with youngest grand child. Husband arrived during rounds. Plan for trach and PEG today. Off sedation, she is able to open eyes and tracking but not able to follow simple commands.   OBJECTIVE Temp:  [97.9 F (36.6 C)-100.4 F (38 C)] 99.9 F (37.7 C) (10/26 0800) Pulse Rate:  [31-115] 60 (10/26 0830) Cardiac Rhythm:  [-] Sinus bradycardia (10/26 0800) Resp:  [14-26] 14 (10/26 0830) BP: (106-195)/(45-97) 136/90 mmHg (10/26 0830) SpO2:  [98 %-100 %] 100 % (10/26 0830) FiO2 (%):  [30 %] 30 % (10/26 0830) Weight:  [91.6 kg (201 lb 15.1 oz)] 91.6 kg (201 lb 15.1 oz) (10/26 0445)  CBC:   Recent Labs Lab 04/13/15 0723  04/15/15 0310 04/17/15 0330  WBC 10.6*  < > 10.2 10.6*  NEUTROABS 7.7  --   --   --   HGB 12.0  < > 11.1* 10.9*  HCT 36.4  < > 35.8* 35.7*  MCV 90.3  < > 92.0 93.5  PLT PLATELET CLUMPS NOTED ON SMEAR, COUNT APPEARS ADEQUATE  < > 143* 151  < > = values in this interval not displayed.  Basic Metabolic Panel:   Recent Labs Lab 04/15/15 0310  04/17/15 0330  04/17/15 2225 04/18/15 0330  NA 154*  < > 159*  < > 155* 157*  K 3.1*  < > 3.4*  --   --  3.2*  CL 120*  < > 125*  --   --  117*  CO2 29  < > 28  --   --  27  GLUCOSE 244*  < > 209*  --   --  237*  BUN 24*  < > 20  --   --  26*  CREATININE 0.48  < > 0.41*  --   --  0.50  CALCIUM 8.8*  < > 8.8*  --   --  8.6*  MG 2.0  --  2.1  --   --   --   PHOS  --   --  3.3  --   --   --   < > = values in this interval not displayed.  Lipid Panel:     Component Value Date/Time   CHOL 208* 04/12/2015 0315   TRIG 231* 04/12/2015 0315   HDL 29* 04/12/2015 0315   CHOLHDL 7.2 04/12/2015 0315   VLDL 46* 04/12/2015 0315   LDLCALC 133* 04/12/2015 0315   HgbA1c:  Lab Results  Component Value Date   HGBA1C 11.4* 04/12/2015   Urine Drug Screen: No results found for: LABOPIA, COCAINSCRNUR, LABBENZ, AMPHETMU, THCU,  LABBARB    IMAGING I have personally reviewed the radiological images below and agree with the radiology interpretations.  Ct Head Wo Contrast  04/17/2015  1. Mild interval evolution of large left MCA territory infarct, with associated edema. Slight rightward midline shift, measuring 2-3 mm, with partial effacement of the left lateral ventricle. Scattered foci of associated hemorrhage are perhaps slightly more diffuse than on the prior study. 2. Mild small vessel ischemic microangiopathy.  04/12/2105  1. Large acute left MCA territory infarct with resolving associated hemorrhage. Similar local mass effect without midline shift. 2. No new intracranial process.  04/12/2015  Acute large LEFT MCA territory infarct with similar patchy areas of hemorrhage. Local mass effect without midline shift. Mildly dense intracranial vessels most compatible with residual contrast.   04/11/2015  Acute hemorrhage in the  left temporal lobe. This may be within the sylvian fissure rather than the insular cortex. There are small gas bubbles in the area of hemorrhage which may be extra vascular gas in the subarachnoid space. This is likely related to the procedure and micro perforation of a vessel. Acute infarct left temporal lobe and possibly left basal ganglia. There is local edema without midline shift   04/11/2015  Slightly more conspicuous loss of gray-white differentiation affecting the left temporal lobe and frontoparietal regions in the left basal ganglia consistent with acute left MCA infarction. Aspects score difficult because of some motion, but estimated at 5 or 6.   04/11/2015  CT findings consistent with early left MCA infarct with a dense left MCA sign. No mass effect or hemorrhage.   2D Echocardiogram   - Left ventricle: The cavity size was normal. Systolic function wasnormal. The estimated ejection fraction was in the range of 55%to 60%. Wall motion was normal; there were no regional wallmotion  abnormalities. - Aortic valve: Valve area (VTI): 2.24 cm^2. Valve area (Vmax): 2cm^2. Valve area (Vmean): 2.04 cm^2. - Atrial septum: No defect or patent foramen ovale was identified.    Physical Exam  General: The patient is intubated, open eyes on pain stimulation, tracking but not following simple commands. Off fentanyl.  Respiratory: Clear lung fields  Cardiovascular: Regular rate and rhythm  Abdomen: Soft, nontender, positive bowel sounds  Skin: 1+ edema in the legs noted..  Neurologic Exam Intubated, off fentanyl, open eyes on pain stimulation, tracking with eyes but not following simple commands. Both eyes conjugated and tracking objects on the left, pupil left 3.39mm and right 68mm, both sluggish to light, difference remained same at light or dark. Doll's eyes present. Positive corneal and gag. Breathing spontaneously. Left UE localized to pain, right UE extension posture on pain. Left LE against gravity, RLE 2/5 on pain stimulation. Reflex 1+ and right babinski positive.   ASSESSMENT/PLAN [name] is a 59 y.o. female with history of diabetes mellitus, hypertension, hyperlipidemia and fibromyalgia presenting with new onset speech difficulty and right hemiparesis. She received IV t-PA (total 50 mg, 19 mg not received) due to elevated BP. 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   Resultant  Respiratory failure, right hemiparesis, cerebral edema  Repeat CT head large L MCA infarct, mild midline shift  2D Echo  EF 55-60%, no source of embolus  LDL 133  HgbA1c 11.4    for VTE prophylaxis Diet NPO time specified Diet NPO time specified  No antithrombotic prior to  admission.now on aspirin suppository.   Ongoing aggressive stroke risk factor management  PEG and trach today  Therapy recommendations:  pending   Disposition:  pending   Cytotoxic cerebral edema  Repeat CT showed mild midline shift  Started 3% saline 04/13/2015, off 04/16/15  Na 159 -> 157  On NS @ 75cc  Na monitoring  Carotid stenosis  L ICA occlusion  S/p acute stent placement (Dr. Arizona Constable at [facility])  Start on aspirin 300 mg suppository  May consider plavix after procedures due to stent  Acute respiratory Failure  Intubated for procedure  Unable to protect airway  Put back on vent during the night, now on CPAP  CCM managing  Malignant Hypertension  BP 179/82 on arrival, as high as 213/97  Treated with cardene drip, off   On norvasc, lisinopril and clonidine now  SBP goal < 160 given hemorrhage  Hyperlipidemia  Home meds:  pravachol 40  LDL 133, goal < 70  Resumed home meds  Continue statin at discharge  Bradycardia - likely due to carotid stent placement  Down to the 30s both pre-IR and post CL placement  BB not recommended  Cardiology has seen, external pacemaker placed and then removed 04/16/15  HR stable  Diabetes type II  HgbA1c 11.4 , goal < 7.0  On lantus 30 u daily PTA, diabetic RN recommends 50% home lantus dose. Ordered.  SSI  CBG monitoring  Other Stroke Risk Factors  Cigarette smoker, quit smoking  ETOH use  Obesity, Body mass index is 32.61 kg/(m^2).   Coronary artery disease  Noncompliance   L femoral bypass  Other Active Problems  Hx breast cancer  AKI - Cr 0.5  Hospital day # 7  This patient is critically ill due to left MCA malignant infarct, left ICA occlusion s/p stenting, respiratory failure needing intubation, bradycardia after stent and at significant risk of neurological worsening, death form brain herniation, heart failure, arrhythmia, hemorrhagic transformation. This patient's care requires  constant monitoring of vital signs, hemodynamics, respiratory and cardiac monitoring, review of multiple databases, neurological assessment, discussion with family, other specialists and medical decision making of high complexity. Discussed with pt sons at bedside. Updated family about pt condition, answered their questions. I spent 45 minutes of neurocritical care time in the care of this patient.   Rosalin Hawking, MD PhD Stroke Neurology 04/18/2015 9:12 AM        To contact Stroke Continuity provider, please refer to [email or web address] After hours, contact General Neurology

## 2015-04-18 NOTE — Progress Notes (Signed)
PT Cancellation Note  Patient Details Name: ARDELL AARONSON MRN: [id number] DOB: [birth year]   Cancelled Treatment:    Reason Eval/Treat Not Completed: Patient not medically ready. Pt getting a trach and peg today. PT to return as able.   Kingsley Callander 04/18/2015, 10:35 AM   Kittie Plater, PT, DPT Pager #: [phone] Office #: [phone]

## 2015-04-18 NOTE — Procedures (Signed)
OPERATIVE REPORT  DATE OF OPERATION:  04/13/2015  PATIENT:  [name]  59 y.o. female  PRE-OPERATIVE DIAGNOSIS:  stroke, bradycardia, dysphagia  POST-OPERATIVE DIAGNOSIS:  Same  PROCEDURE:  Procedure(s): Temporary Pacemaker  SURGEON:  Surgeon(s): Peter M Martinique, MD  ASSISTANT: Randon Goldsmith, PA-C  ANESTHESIA:   local and IV sedation  EBL: <20 ml  BLOOD ADMINISTERED: none  DRAINS: Urinary Catheter (Foley) and Gastrostomy Tube   SPECIMEN:  No Specimen  COUNTS CORRECT:  YES  PROCEDURE DETAILS: This procedure was performed at the bedside in the 3 mid [place] trauma/neurosurgery intensive care unit.  A proper timeout was performed identifying the patient and procedure to be performed. Her abdominal wall particularly in the left upper quadrant was prepped and draped in usual sterile manner. A Pentax [id number] endoscope was passed oral pharyngeal he next to the orogastric tube that was in place.  We followed the gastric tube into the stomach and subsequently cannulated the duodenum where there appeared to be no evidence of ulceration or obstruction.  We retracted the endoscope back into the body of the stomach where we could see the impulse of the assistant on the anterior abdominal wall. An incision was made at that site and subsequently an angiocatheter made passed through the incision into the gastric lumen under direct vision.  A looped blue wire was passed through the Angiocath and then snared and pulled out through the patient's mouth. We attached that to the pull-through gastrostomy tube which was subsequently pulled back through the patient's oropharynx to its proper position with the bolster on the anterior gastric wall.  Pictures of the gastrostomy tube and is proper position are taken.  The tube was subsequently secured in place in usual manner. All counts were correct including needles, sponges, and instruments.      PATIENT DISPOSITION:  ICU - intubated and  hemodynamically stable.   Anatasia Tino 10/26/20161:52 PM

## 2015-04-18 NOTE — Progress Notes (Signed)
For PEG today.  Kerry [name]. Dahlia Bailiff, MD, [place] [phone] Trauma Surgeon

## 2015-04-19 ENCOUNTER — Inpatient Hospital Stay (HOSPITAL_COMMUNITY): Payer: 59

## 2015-04-19 DIAGNOSIS — I69921 Dysphasia following unspecified cerebrovascular disease: Secondary | ICD-10-CM

## 2015-04-19 DIAGNOSIS — G8100 Flaccid hemiplegia affecting unspecified side: Secondary | ICD-10-CM

## 2015-04-19 DIAGNOSIS — R1314 Dysphagia, pharyngoesophageal phase: Secondary | ICD-10-CM

## 2015-04-19 LAB — BASIC METABOLIC PANEL
Anion gap: 7 (ref 5–15)
BUN: 21 mg/dL — AB (ref 6–20)
CALCIUM: 8.3 mg/dL — AB (ref 8.9–10.3)
CHLORIDE: 122 mmol/L — AB (ref 101–111)
CO2: 26 mmol/L (ref 22–32)
CREATININE: 0.52 mg/dL (ref 0.44–1.00)
GFR calc Af Amer: >60 mL/min (ref >60)
Glucose, Bld: 159 mg/dL — ABNORMAL HIGH (ref 65–99)
Potassium: 3.5 mmol/L (ref 3.5–5.1)
SODIUM: 155 mmol/L — AB (ref 135–145)

## 2015-04-19 LAB — PHOSPHORUS: PHOSPHORUS: 3.5 mg/dL (ref 2.5–4.6)

## 2015-04-19 LAB — CBC
HCT: 34.2 % — ABNORMAL LOW (ref 36.0–46.0)
Hemoglobin: 10.3 g/dL — ABNORMAL LOW (ref 12.0–15.0)
MCH: 28.5 pg (ref 26.0–34.0)
MCHC: 30.1 g/dL (ref 30.0–36.0)
MCV: 94.5 fL (ref 78.0–100.0)
PLATELETS: 178 10*3/uL (ref 150–400)
RBC: 3.62 MIL/uL — ABNORMAL LOW (ref 3.87–5.11)
RDW: 13.5 % (ref 11.5–15.5)
WBC: 9.6 10*3/uL (ref 4.0–10.5)

## 2015-04-19 LAB — MAGNESIUM: Magnesium: 2.1 mg/dL (ref 1.7–2.4)

## 2015-04-19 LAB — GLUCOSE, CAPILLARY
GLUCOSE-CAPILLARY: 130 mg/dL — AB (ref 65–99)
GLUCOSE-CAPILLARY: 103 mg/dL — AB (ref 65–99)
GLUCOSE-CAPILLARY: 180 mg/dL — AB (ref 65–99)
Glucose-Capillary: 139 mg/dL — ABNORMAL HIGH (ref 65–99)
Glucose-Capillary: 152 mg/dL — ABNORMAL HIGH (ref 65–99)
Glucose-Capillary: 188 mg/dL — ABNORMAL HIGH (ref 65–99)

## 2015-04-19 LAB — SODIUM
SODIUM: 158 mmol/L — AB (ref 135–145)
Sodium: 159 mmol/L — ABNORMAL HIGH (ref 135–145)
Sodium: 151 mmol/L — ABNORMAL HIGH (ref 135–145)

## 2015-04-19 MED ORDER — ALBUTEROL SULFATE (2.5 MG/3ML) 0.083% IN NEBU
2.5000 mg | INHALATION_SOLUTION | Freq: Four times a day (QID) | RESPIRATORY_TRACT | Status: DC
  Administered 2015-04-19 – 2015-04-27 (×33): 2.5 mg via RESPIRATORY_TRACT
  Filled 2015-04-19 (×33): qty 3

## 2015-04-19 MED ORDER — FREE WATER
200.0000 mL | Status: DC
  Administered 2015-04-19 – 2015-04-20 (×4): 200 mL

## 2015-04-19 MED ORDER — LISINOPRIL 10 MG PO TABS
10.0000 mg | ORAL_TABLET | Freq: Two times a day (BID) | ORAL | Status: DC
  Administered 2015-04-19 – 2015-04-20 (×2): 10 mg via ORAL
  Filled 2015-04-19 (×2): qty 1

## 2015-04-19 MED ORDER — CLOPIDOGREL BISULFATE 75 MG PO TABS
75.0000 mg | ORAL_TABLET | Freq: Every day | ORAL | Status: DC
  Administered 2015-04-19 – 2015-04-27 (×9): 75 mg via ORAL
  Filled 2015-04-19 (×9): qty 1

## 2015-04-19 MED ORDER — SODIUM CHLORIDE 0.9 % IV SOLN
INTRAVENOUS | Status: DC
  Administered 2015-04-19: 75 mL/h via INTRAVENOUS

## 2015-04-19 MED ORDER — LISINOPRIL 10 MG PO TABS
10.0000 mg | ORAL_TABLET | Freq: Every day | ORAL | Status: DC
  Administered 2015-04-19: 10 mg via ORAL
  Filled 2015-04-19: qty 1

## 2015-04-19 NOTE — Progress Notes (Signed)
STROKE TEAM PROGRESS NOTE   SUBJECTIVE (INTERVAL HISTORY) Son is at bedside. Had trach and PEG yesterday. Intermittent fentanyl overnight. She is able to open eyes and tracking on stimulation, but not able to follow simple commands. BP stable. HR as low as mid 30s, but with stimulation, HR goes up to 50s. Na 155 this morning.    OBJECTIVE Temp:  [99.5 F (37.5 C)-100.4 F (38 C)] 99.7 F (37.6 C) (10/27 0800) Pulse Rate:  [40-83] 40 (10/27 0850) Cardiac Rhythm:  [-] Sinus bradycardia (10/27 0800) Resp:  [12-26] 16 (10/27 0850) BP: (67-166)/(45-78) 127/54 mmHg (10/27 0850) SpO2:  [100 %] 100 % (10/27 0850) FiO2 (%):  [30 %-100 %] 40 % (10/27 0850) Weight:  [206 lb 5.6 oz (93.6 kg)] 206 lb 5.6 oz (93.6 kg) (10/27 0500)  CBC:   Recent Labs Lab 04/13/15 0723  04/17/15 0330 04/19/15 0325  WBC 10.6*  < > 10.6* 9.6  NEUTROABS 7.7  --   --   --   HGB 12.0  < > 10.9* 10.3*  HCT 36.4  < > 35.7* 34.2*  MCV 90.3  < > 93.5 94.5  PLT PLATELET CLUMPS NOTED ON SMEAR, COUNT APPEARS ADEQUATE  < > 151 178  < > = values in this interval not displayed.  Basic Metabolic Panel:   Recent Labs Lab 04/17/15 0330  04/18/15 0330  04/18/15 2100 04/19/15 0325  NA 159*  < > 157*  < > 154* 155*  K 3.4*  --  3.2*  --   --  3.5  CL 125*  --  117*  --   --  122*  CO2 28  --  27  --   --  26  GLUCOSE 209*  --  237*  --   --  159*  BUN 20  --  26*  --   --  21*  CREATININE 0.41*  --  0.50  --   --  0.52  CALCIUM 8.8*  --  8.6*  --   --  8.3*  MG 2.1  --   --   --   --  2.1  PHOS 3.3  --   --   --   --  3.5  < > = values in this interval not displayed.  Lipid Panel:     Component Value Date/Time   CHOL 208* 04/12/2015 0315   TRIG 231* 04/12/2015 0315   HDL 29* 04/12/2015 0315   CHOLHDL 7.2 04/12/2015 0315   VLDL 46* 04/12/2015 0315   LDLCALC 133* 04/12/2015 0315   HgbA1c:  Lab Results  Component Value Date   HGBA1C 11.4* 04/12/2015   Urine Drug Screen: No results found for: LABOPIA,  COCAINSCRNUR, LABBENZ, AMPHETMU, THCU, LABBARB    IMAGING I have personally reviewed the radiological images below and agree with the radiology interpretations.  Ct Head Wo Contrast  04/17/2015  1. Mild interval evolution of large left MCA territory infarct, with associated edema. Slight rightward midline shift, measuring 2-3 mm, with partial effacement of the left lateral ventricle. Scattered foci of associated hemorrhage are perhaps slightly more diffuse than on the prior study. 2. Mild small vessel ischemic microangiopathy.  04/12/2105  1. Large acute left MCA territory infarct with resolving associated hemorrhage. Similar local mass effect without midline shift. 2. No new intracranial process.  04/12/2015  Acute large LEFT MCA territory infarct with similar patchy areas of hemorrhage. Local mass effect without midline shift. Mildly dense intracranial vessels most compatible with residual contrast.  04/11/2015  Acute hemorrhage in the left temporal lobe. This may be within the sylvian fissure rather than the insular cortex. There are small gas bubbles in the area of hemorrhage which may be extra vascular gas in the subarachnoid space. This is likely related to the procedure and micro perforation of a vessel. Acute infarct left temporal lobe and possibly left basal ganglia. There is local edema without midline shift   04/11/2015  Slightly more conspicuous loss of gray-white differentiation affecting the left temporal lobe and frontoparietal regions in the left basal ganglia consistent with acute left MCA infarction. Aspects score difficult because of some motion, but estimated at 5 or 6.   04/11/2015  CT findings consistent with early left MCA infarct with a dense left MCA sign. No mass effect or hemorrhage.   2D Echocardiogram   - Left ventricle: The cavity size was normal. Systolic function wasnormal. The estimated ejection fraction was in the range of 55%to 60%. Wall motion was normal;  there were no regional wallmotion abnormalities. - Aortic valve: Valve area (VTI): 2.24 cm^2. Valve area (Vmax): 2cm^2. Valve area (Vmean): 2.04 cm^2. - Atrial septum: No defect or patent foramen ovale was identified.    Physical Exam  General: The patient is intubated, open eyes on pain stimulation, tracking but not following simple commands. Off fentanyl.  Respiratory: Clear lung fields  Cardiovascular: Regular rate and rhythm  Abdomen: Soft, nontender, positive bowel sounds  Skin: 1+ edema in the legs noted..  Neurologic Exam Had trach on PS, off fentanyl, open eyes on pain stimulation, tracking with eyes but not following simple commands. Both eyes conjugated and tracking objects on the left, pupil left 3.26mm and right 93mm, both sluggish to light, difference remained same at light or dark. Doll's eyes present. Positive corneal and gag. Breathing spontaneously. Left UE localized to pain, right UE extension posture on pain. Left LE against gravity, RLE 2/5 on pain stimulation. Reflex 1+ and right babinski positive.   ASSESSMENT/PLAN Kerry [name] is a 59 y.o. female with history of diabetes mellitus, hypertension, hyperlipidemia and fibromyalgia presenting with new onset speech difficulty and right hemiparesis. She received IV t-PA (total 50 mg, 19 mg not received) due to elevated BP. 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   Resultant  Respiratory failure, right hemiparesis, cerebral edema  Repeat CT head large L MCA infarct, mild midline shift  2D Echo  EF 55-60%, no source of embolus  LDL 133  HgbA1c 11.4    for VTE prophylaxis Diet NPO time specified  No  antithrombotic prior to admission.now on aspirin suppository.   Ongoing aggressive stroke risk factor management  PEG and trach done on 04/18/15  Therapy recommendations:  pending   Disposition:  pending   Cytotoxic cerebral edema  Repeat CT showed mild midline shift  Started 3% saline 04/13/2015, off 04/16/15  Na 159 -> 157 -> 155  On NS @ 75cc  Na monitoring  D/c central line  Carotid stenosis  L ICA occlusion  S/p acute stent placement (Dr. Arizona Constable at [facility])  Start on aspirin 300 mg suppository  Will switch to plavix for carotid stenting  Acute respiratory Failure  Intubated for procedure  Unable to protect airway  S/p  trach, now on CPAP  Malignant Hypertension  BP 179/82 on arrival, as high as 213/97  Treated with cardene drip, off   BP stable, at low side  On lisinopril low dose now  Hyperlipidemia  Home meds:  pravachol 40  LDL 133, goal < 70  Resumed home meds  Continue statin at discharge  Bradycardia - likely due to carotid stent placement  Down to the 30s both pre-IR and post CL placement  BB not recommended  Cardiology has seen, external pacemaker placed and then removed 04/16/15  HR stable  Diabetes type II  HgbA1c 11.4 , goal < 7.0  On lantus 30 u daily PTA, diabetic RN recommends 50% home lantus dose. Ordered.  SSI  CBG monitoring  Other Stroke Risk Factors  Cigarette smoker, quit smoking  ETOH use  Obesity, Body mass index is 33.32 kg/(m^2).   Coronary artery disease  Noncompliance   L femoral bypass  Other Active Problems  Hx breast cancer  AKI - Cr 0.5  Hospital day # 8  This patient is critically ill due to left MCA malignant infarct, left ICA occlusion s/p stenting, respiratory failure needing intubation, bradycardia after stent and at significant risk of neurological worsening, death form brain herniation, heart failure, arrhythmia, hemorrhagic transformation. This patient's care requires constant  monitoring of vital signs, hemodynamics, respiratory and cardiac monitoring, review of multiple databases, neurological assessment, discussion with family, other specialists and medical decision making of high complexity. Discussed with pt sons at bedside. Updated family about pt condition, answered their questions. I spent 40 minutes of neurocritical care time in the care of this patient.   Rosalin Hawking, MD PhD Stroke Neurology 04/19/2015 9:32 AM        To contact Stroke Continuity provider, please refer to [email or web address] After hours, contact General Neurology

## 2015-04-19 NOTE — Progress Notes (Signed)
Chart reviewed as part of trach team consult. Patient received bedside trach 10/26. Per RN, will open eyes intermittently to command, moves all 4 extremities but otherwise, not attempts to communicate or follow commands. Continue to recommend PMSV evaluation 10/28 to facilitate use of upper airway. MD please order if agree.  [place], [place] [phone]

## 2015-04-19 NOTE — Consult Note (Signed)
Physical Medicine and Rehabilitation Consult   Reason for Consult:  Right sided weakness, right facial weakness, trach dependent Referring Physician: Dr. Erlinda Hong   HPI: Kerry [name] is a 59 y.o. female with history of HTN, CAD, DM type 2, breast cancer who was admitted on 04/11/15 with right facial and right sided weakness. CT showed dense L-MCA sign and TPA started but she had worsening of right hemiparesis with lethargy. Repeat CT negative for bleed and she underwent cerebral angio showed atherosclerotic occlusion of L-ICA and was treated with embolectomy and stent placement and TICI revascularization. Post procedure was vent dependent and developed small patchy areas of hemorrhage within acute  Large L-MCA infarct as well as cerebral edema. She was started on hypertonic saline for edema management. Malignant HTN treated with cardene drip and cardiology consulted for assistance with severe bradycardia with periods of asystole.  Temporary pacemaker placed by Dr. Martinique on 10/21. She continued to have lethargy with decrease in LOC.  PEG placed by Dr. Hulen Skains and Lurline Idol with BAL performed by Dr Nelda Marseille yesterday. Currently vent dependent and PT/OT/ST evaluations done today and CIR recommended for follow up therapy.    Review of Systems  Unable to perform ROS: medical condition   On ventilator via trach   Past Medical History  Diagnosis Date  . Hypertension   . Coronary artery disease   . Diabetes mellitus   . Breast cancer ([place]) 03/10/2007    Left breat  . Degenerative disc disease, cervical   . Degenerative disc disease, lumbar   . Fibromyalgia   . Hyperlipidemia   . Carpal tunnel syndrome on right     Past Surgical History  Procedure Laterality Date  . Cardiac surgery    . Appendectomy    . Breast surgery    . Mastectomy Left 03/2007  . Cardiac catheterization  2005  . Vein surgery Right 2005  . Tubal ligation Right   . Radiology with anesthesia N/A 04/11/2015   Procedure: RADIOLOGY WITH ANESTHESIA;  Surgeon: Luanne Bras, MD;  Location: [facility];  Service: Radiology;  Laterality: N/A;  . Cardiac catheterization N/A 04/13/2015    Procedure: Temporary Pacemaker;  Surgeon: Peter M Martinique, MD;  Location: [facility];  Service: Cardiovascular;  Laterality: N/A;    Family History  Problem Relation Age of Onset  . Cancer Mother     Breast cancer (right)  . Diabetes Sister   . Cancer Maternal Aunt     Breast cancer  . Cancer Maternal Aunt     Breast cancer  . Cancer Cousin     Breast Cancer    Social History:  Married. Independent PTA. reports that she has been smoking.  She has never used smokeless tobacco. She reports that she drinks alcohol. She reports that she does not use illicit drugs.    Allergies  Allergen Reactions  . Oxycodone     Sick   . Oxycontin [Oxycodone Hcl]     Sick    Medications Prior to Admission  Medication Sig Dispense Refill  . LISINOPRIL PO Take 20 mg by mouth daily.     Marland Kitchen LORazepam (ATIVAN) 1 MG tablet Take 1 mg by mouth at bedtime.    Marland Kitchen amLODipine (NORVASC) 10 MG tablet Take 10 mg by mouth daily.    . cloNIDine (CATAPRES) 0.1 MG tablet Take 0.1 mg by mouth 2 (two) times daily.    Marland Kitchen glipiZIDE (GLUCOTROL XL) 10 MG 24 hr tablet Take 10  mg by mouth daily.    . insulin glargine (LANTUS) 100 UNIT/ML injection Inject 30 Units into the skin at bedtime.     . metoprolol succinate (TOPROL-XL) 50 MG 24 hr tablet Take 50 mg by mouth daily. Take with or immediately following a meal.    . oxyCODONE (OXY IR/ROXICODONE) 5 MG immediate release tablet Take 5 mg by mouth every 4 (four) hours as needed.    Marland Kitchen oxyCODONE-acetaminophen (PERCOCET) 5-325 MG per tablet Take 1 tablet by mouth every 6 (six) hours as needed. 15 tablet 0  . PARoxetine (PAXIL) 20 MG tablet Take 20 mg by mouth every morning.    . pravastatin (PRAVACHOL) 40 MG tablet Take 40 mg by mouth daily.    . SUMAtriptan (IMITREX) 100 MG tablet Take 100 mg by mouth  daily.      Home: Home Living Family/patient expects to be discharged to:: Private residence Living Arrangements: Spouse/significant other Available Help at Discharge: Family, Available 24 hours/day (Works every other week for [employer] can provide 24 ) Type of Home: House Home Access: Stairs to enter [employer] of Steps: 3 Entrance Stairs-Rails: Right, Left, Can reach both Home Layout: Two level, Able to live on main level with bedroom/bathroom Bathroom Shower/Tub: Tub/shower unit, Architectural technologist: Standard Bathroom Accessibility: Yes Home Equipment: None Additional Comments: Pt drove as well.  Functional History: Prior Function Level of Independence: Independent Functional Status:  Mobility: Bed Mobility Overal bed mobility: +2 for physical assistance, Needs Assistance Bed Mobility: Supine to Sit, Sit to Supine Supine to sit: +2 for physical assistance, Total assist Sit to supine: +2 for physical assistance, Total assist        ADL: ADL Overall ADL's : Needs assistance/impaired Functional mobility during ADLs:  (total A +2 to move to EOB using helicopter technique) General ADL Comments: total A at this time for ADL. Able to wash mouth/eyes after hand over hand to initiate and gestural cues  Cognition: Cognition Overall Cognitive Status: Impaired/Different from baseline Orientation Level: Intubated/Tracheostomy - Unable to assess Cognition Arousal/Alertness: Lethargic Behavior During Therapy: Flat affect Overall Cognitive Status: Impaired/Different from baseline Area of Impairment: Following commands Following Commands:  (not following commands) General Comments: not following verbal commands. Pt able to imitate gesture of showing 1 finger after multiple repetitions. Pt appeared to wave after gestural cues.   Blood pressure 158/58, pulse 49, temperature 99.7 F (37.6 C), temperature source Core (Comment), resp. rate 22, height 5\' 6"   (1.676 m), weight 93.6 kg (206 lb 5.6 oz), SpO2 100 %. Physical Exam  Nursing note and vitals reviewed. Constitutional: She appears well-developed and well-nourished. She appears lethargic.  HENT:  Head: Normocephalic and atraumatic.  Tracheostomy with ventilator support  Eyes: Conjunctivae are normal. Pupils are equal, round, and reactive to light.  Opens eyes briefly no evidence of injection no drainage no evidence of nystagmus follows and tracks  Cardiovascular: Normal rate, regular rhythm and normal heart sounds.   Respiratory: Breath sounds normal. No respiratory distress. She has no wheezes. She has no rales.  GI: Soft. Bowel sounds are normal. There is no tenderness.  No tenderness around PEG site  Neurological: She appears lethargic.  Poorly responsive will withdraw to pain on the left side Unable to do formal muscle manual muscle testing There is spontaneous movement in the left upper and left lower extremity. Unable to do sensory testing other than withdrawal to pinch    Results for orders placed or performed during the hospital encounter of 04/11/15 (from  the past 24 hour(s))  Glucose, capillary     Status: Abnormal   Collection Time: 04/18/15  3:04 PM  Result Value Ref Range   Glucose-Capillary 146 (H) 65 - 99 mg/dL  Culture, respiratory (NON-Expectorated)     Status: None (Preliminary result)   Collection Time: 04/18/15  3:08 PM  Result Value Ref Range   Specimen Description TRACHEAL ASPIRATE    Special Requests Normal    Gram Stain      MODERATE WBC PRESENT,BOTH PMN AND MONONUCLEAR RARE SQUAMOUS EPITHELIAL CELLS PRESENT RARE GRAM POSITIVE COCCI IN PAIRS IN CLUSTERS RARE GRAM NEGATIVE RODS Performed at [facility]    Culture PENDING    Report Status PENDING   Sodium     Status: Abnormal   Collection Time: 04/18/15  4:32 PM  Result Value Ref Range   Sodium 161 (HH) 135 - 145 mmol/L  Glucose, capillary     Status: Abnormal   Collection Time: 04/18/15   7:38 PM  Result Value Ref Range   Glucose-Capillary 131 (H) 65 - 99 mg/dL   Comment 1 Notify RN   Sodium     Status: Abnormal   Collection Time: 04/18/15  9:00 PM  Result Value Ref Range   Sodium 154 (H) 135 - 145 mmol/L  Glucose, capillary     Status: Abnormal   Collection Time: 04/19/15 12:05 AM  Result Value Ref Range   Glucose-Capillary 152 (H) 65 - 99 mg/dL   Comment 1 Notify RN   Basic metabolic panel     Status: Abnormal   Collection Time: 04/19/15  3:25 AM  Result Value Ref Range   Sodium 155 (H) 135 - 145 mmol/L   Potassium 3.5 3.5 - 5.1 mmol/L   Chloride 122 (H) 101 - 111 mmol/L   CO2 26 22 - 32 mmol/L   Glucose, Bld 159 (H) 65 - 99 mg/dL   BUN 21 (H) 6 - 20 mg/dL   Creatinine, Ser 0.52 0.44 - 1.00 mg/dL   Calcium 8.3 (L) 8.9 - 10.3 mg/dL   GFR calc non Af Amer >60 >60 mL/min   GFR calc Af Amer >60 >60 mL/min   Anion gap 7 5 - 15  CBC     Status: Abnormal   Collection Time: 04/19/15  3:25 AM  Result Value Ref Range   WBC 9.6 4.0 - 10.5 K/uL   RBC 3.62 (L) 3.87 - 5.11 MIL/uL   Hemoglobin 10.3 (L) 12.0 - 15.0 g/dL   HCT 34.2 (L) 36.0 - 46.0 %   MCV 94.5 78.0 - 100.0 fL   MCH 28.5 26.0 - 34.0 pg   MCHC 30.1 30.0 - 36.0 g/dL   RDW 13.5 11.5 - 15.5 %   Platelets 178 150 - 400 K/uL  Phosphorus     Status: None   Collection Time: 04/19/15  3:25 AM  Result Value Ref Range   Phosphorus 3.5 2.5 - 4.6 mg/dL  Magnesium     Status: None   Collection Time: 04/19/15  3:25 AM  Result Value Ref Range   Magnesium 2.1 1.7 - 2.4 mg/dL  Glucose, capillary     Status: Abnormal   Collection Time: 04/19/15  3:36 AM  Result Value Ref Range   Glucose-Capillary 139 (H) 65 - 99 mg/dL   Comment 1 Notify RN   Glucose, capillary     Status: Abnormal   Collection Time: 04/19/15  7:22 AM  Result Value Ref Range   Glucose-Capillary 130 (H) 65 -  99 mg/dL  Sodium     Status: Abnormal   Collection Time: 04/19/15  9:25 AM  Result Value Ref Range   Sodium 158 (H) 135 - 145 mmol/L    Glucose, capillary     Status: Abnormal   Collection Time: 04/19/15 11:13 AM  Result Value Ref Range   Glucose-Capillary 103 (H) 65 - 99 mg/dL   Dg Chest Port 1 View  04/19/2015  CLINICAL DATA:  Respiratory failure, history of CVA, breast malignancy,and diabetes EXAM: PORTABLE CHEST 1 VIEW COMPARISON:  Portable chest x-ray of April 18, 2015 FINDINGS: The left lung is well-expanded and clear. On the right there is new increased density at the lung base which partially obscures the lateral aspect of the hemidiaphragm. The interstitial markings on the right are more conspicuous. The cardiac silhouette is enlarged. The pulmonary vascularity is not clearly engorged. The tracheostomy appliance tip projects at the level of the inferior margin of the clavicular heads. The right internal jugular venous catheter tip projects over the distal portion of the SVC. IMPRESSION: New alveolar opacity at the right lung base may reflect the sequelae of aspiration. Loculated pleural fluid could present similarly. This is a significant change since yesterday's study. Mild prominence of the pulmonary interstitial markings on the right has developed as well which may reflect asymmetric interstitial edema or early interstitial pneumonia. Electronically Signed   By: David  Martinique M.D.   On: 04/19/2015 08:00   Dg Chest Port 1 View  04/18/2015  CLINICAL DATA:  Tracheostomy tube placement. EXAM: PORTABLE CHEST 1 VIEW COMPARISON:  04/17/2015 and prior radiographs FINDINGS: A tracheostomy tube is identified without definite complicating features. Interval removal of an endotracheal tube and NG tube noted. A right IJ central venous catheter is present with tip overlying the superior cavoatrial junction. Mild cardiomegaly again noted. Mild bibasilar atelectasis again identified. There is no evidence pneumothorax. IMPRESSION: Tracheostomy tube placement without definite complicating features. Endotracheal and NG tubes removed. Mild  bibasilar atelectasis. Electronically Signed   By: Margarette Canada M.D.   On: 04/18/2015 15:21    Assessment/Plan: Diagnosis: Large left MCA infarct with severe dysphagia, right hemiparesis, aphasia, vent dependence 1. Does the need for close, 24 hr/day medical supervision in concert with the patient's rehab needs make it unreasonable for this patient to be served in a less intensive setting? not currently, on vent 2. Co-Morbidities requiring supervision/potential complications: At high risk for aspiration pneumonia, Diabetes 3. Due to bladder management, bowel management, safety, skin/wound care, disease management, medication administration, pain management and patient education, does the patient require 24 hr/day rehab nursing? Yes 4. Does the patient require coordinated care of a physician, rehab nurse, PT (1-2 hrs/day, 5 days/week), OT (1-2 hrs/day, 5 days/week) and SLP (0.5-1 hrs/day, 5 days/week) to address physical and functional deficits in the context of the above medical diagnosis(es)? Not at the current time anticipate  in one week Addressing deficits in the following areas: balance, endurance, locomotion, strength, transferring, bowel/bladder control, bathing, dressing, feeding, grooming, toileting, cognition, speech, language, swallowing and psychosocial support 5. Can the patient actively participate in an intensive therapy program of at least 3 hrs of therapy per day at least 5 days per week? No 6. The potential for patient to make measurable gains while on inpatient rehab is poor 7. Anticipated functional outcomes upon discharge from inpatient rehab are n/a  with PT, n/a with OT, n/a with SLP. 8. Estimated rehab length of stay to reach the above functional goals is: Once the  patient is ready would anticipate 21-24 day length of stay 9. Does the patient have adequate social supports and living environment to accommodate these discharge functional goals? Potentially 10. Anticipated D/C  setting: Home 11. Anticipated post D/C treatments: depending on discharge environment if able to be discharged to home and home health 12. Overall Rehab/Functional Prognosis: fair  RECOMMENDATIONS: This patient's condition is appropriate for continued rehabilitative care in the following setting: CIR if patient is able to be weaned from vent, if not then LTAC Patient has agreed to participate in recommended program. NA Note that insurance prior authorization may be required for reimbursement for recommended care.  Comment: Rehabilitation admission coordinator will follow progress while in acute care    04/19/2015

## 2015-04-19 NOTE — Evaluation (Signed)
Physical Therapy Evaluation Patient Details Name: DEZAREE TRACEY MRN: [id number] DOB: [birth year] Today's Date: 04/19/2015   History of Present Illness  59 y.o. female with a history of diabetes mellitus, hypertension and hyperlipidemia brought to the emergency room following acute onset of speech difficulty as well as right facial and extremity weakness.Dense left MCA was noted, indicative of acute thrombus. Patient was deemed a candidate for TPA which was started. She had repeated problems with hypertension requiring intervention with IV labetalol as well as hydralazine. Complete dose of TPA was not administered. Patient was taken to interventional radiology for further management following a repeat CT scan of her head to rule out intracranial hemorrhage after worsening of right hemiparesis and development of lethargy. Repeat study showed no signs of acute intracranial hemorrhage. Study did show early signs of possible MCA territory stroke involving left temporal and frontoparietal regions as well as left basal ganglia area.  Clinical Impression  Pt admitted with above diagnosis. Pt currently with functional limitations due to the deficits listed below (see PT Problem List). Pt sat EOB for up 15 min with total assist.  Difficult to keep pt aroused.  Supportive family. Feel Rehab would be a good option for pt.  Will follow acutely. Pt will benefit from skilled PT to increase their independence and safety with mobility to allow discharge to the venue listed below.      Follow Up Recommendations CIR;Supervision/Assistance - 24 hour    Equipment Recommendations  Other (comment) (TBA)    Recommendations for Other Services       Precautions / Restrictions Precautions Precautions: Fall;Other (comment) (trach/peg) Restrictions Weight Bearing Restrictions: No      Mobility  Bed Mobility Overal bed mobility: +2 for physical assistance;Needs Assistance Bed Mobility: Supine to Sit;Sit to  Supine     Supine to sit: +2 for physical assistance;Total assist Sit to supine: +2 for physical assistance;Total assist      Transfers                    Ambulation/Gait                Stairs            Wheelchair Mobility    Modified Rankin (Stroke Patients Only)       Balance Overall balance assessment: Needs assistance Sitting-balance support: No upper extremity supported;Feet supported Sitting balance-Leahy Scale: Zero Sitting balance - Comments: Pt sat EOB x 15 minutes with total assist.  Occasional initiation of trunk movement but not enough to hold herself up at all.  Had to hold pts head up with hand on forehead.  Pt with delayed following of commands throughout.  Difficulty keeping pt aroused as well.                                      Pertinent Vitals/Pain Pain Assessment: Faces Pain Score: 2  Faces Pain Scale: Hurts a little bit Pain Location: with painful stimulation left trapezius Pain Descriptors / Indicators: Grimacing Pain Intervention(s): Limited activity within patient's tolerance;Monitored during session;Repositioned  VSS    Home Living Family/patient expects to be discharged to:: Private residence Living Arrangements: Spouse/significant other Available Help at Discharge: Family;Available 24 hours/day (Works every other week for [employer] can provide 24 ) Type of Home: House Home Access: Stairs to enter Entrance Stairs-Rails: Right;Left;Can reach both [employer] of Steps: 3 Home Layout: Two  level;Able to live on main level with bedroom/bathroom Home Equipment: None Additional Comments: Pt drove as well.    Prior Function Level of Independence: Independent               Hand Dominance   Dominant Hand: Right    Extremity/Trunk Assessment   Upper Extremity Assessment: Defer to OT evaluation RUE Deficits / Details: flaccid. no active movement. Brunstrom stagge 1   RUE  Sensation:  (unable to assess. did not resopnd to noxious stimuli RUE)     Lower Extremity Assessment: RLE deficits/detail RLE Deficits / Details: flaccid. no active movement noted. LLE Deficits / Details: Able to hold LLE in extension sitting EOB  Cervical / Trunk Assessment: Other exceptions  Communication   Communication: Tracheostomy  Cognition Arousal/Alertness: Lethargic Behavior During Therapy: Flat affect Overall Cognitive Status: Impaired/Different from baseline Area of Impairment: Following commands       Following Commands:  (not following commands)       General Comments: not following verbal commands. Pt able to imitate gesture of showing 1 finger after multiple repetitions. Pt appeared to wave after gestural cues.     General Comments      Exercises Other Exercises Other Exercises: Educated family on R elbow/hand/wrist PROM and need to keep RUE elevated on 2 pillows      Assessment/Plan    PT Assessment Patient needs continued PT services  PT Diagnosis Generalized weakness;Hemiplegia dominant side   PT Problem List Decreased activity tolerance;Decreased balance;Decreased mobility;Decreased coordination;Decreased knowledge of use of DME;Decreased safety awareness;Decreased knowledge of precautions;Decreased strength;Decreased cognition  PT Treatment Interventions DME instruction;Functional mobility training;Therapeutic activities;Therapeutic exercise;Balance training;Patient/family education;Cognitive remediation;Neuromuscular re-education   PT Goals (Current goals can be found in the Care Plan section) Acute Rehab PT Goals Patient Stated Goal: family  goal is for pt to go to rehab PT Goal Formulation: With patient/family Time For Goal Achievement: 05/03/15 Potential to Achieve Goals: Good    Frequency Min 3X/week   Barriers to discharge        Co-evaluation PT/OT/SLP Co-Evaluation/Treatment: Yes Reason for Co-Treatment: Complexity of the patient's  impairments (multi-system involvement) PT goals addressed during session: Mobility/safety with mobility OT goals addressed during session: ADL's and self-care;Strengthening/ROM       End of Session Equipment Utilized During Treatment: Oxygen (trach on vent) Activity Tolerance: Patient limited by fatigue Patient left: in bed;with call bell/phone within reach;with family/visitor present;with SCD's reapplied;with restraints reapplied Nurse Communication: Mobility status;Need for lift equipment         Time: 7846-9629 PT Time Calculation (min) (ACUTE ONLY): 40 min   Charges:   PT Evaluation $Initial PT Evaluation Tier I: 1 Procedure PT Treatments $Therapeutic Activity: 8-22 mins   PT G CodesDenice Paradise 2015-05-03, 12:55 PM  Daris Harkins,PT Acute Rehabilitation [phone] (pager)

## 2015-04-19 NOTE — Progress Notes (Signed)
Patient seen for trach team follow up.  No education at this time.  Patient vent dependent at this time.  All equipment needed at bedside.

## 2015-04-19 NOTE — Progress Notes (Signed)
Rehab Admissions Coordinator Note:  Patient was screened by Retta Diones for appropriateness for an Inpatient Acute Rehab Consult.  At this time, we are recommending Inpatient Rehab consult.  Retta Diones 04/19/2015, 1:36 PM  I can be reached at [phone].

## 2015-04-19 NOTE — Progress Notes (Signed)
Patient is currently vent dependent and minimally responsive to pain.  Will defer CIR consult until weaned to [facility] and shows ability to tolerate therapy.

## 2015-04-19 NOTE — Progress Notes (Signed)
Patient ID: Kerry [name], female   DOB: [birth year], 59 y.o.   MRN: [id number] On vent Abdomen soft, PEG in place with binder OK to use PEG for meds and tube feeds. [name], MD, MPH, FACS Trauma: [phone] General Surgery: [phone]

## 2015-04-19 NOTE — Progress Notes (Signed)
Met with mulitple family members at bedside, at their request.  Pt's son states he is pleased with the progress his mother is making, and understands it may take some time to see significant results.  Family very interested in inpatient rehab for pt; explained that we are awaiting PT/OT evaluations, and that pt will have to be able to tolerate 3 hrs of therapy/day for admission.  They do verbalize understanding of this.  Son interested in applying for disability for his mother to help with cost of medical bills, as he is not sure what pt's insurance will pay for.  Son given [place] Medicaid and [place] Disability applications with instructions on how to apply.  Son appreciative of help.    Will continue to follow progress with PT/OT; hopeful for CIR admission when able to tolerate therapies.    Reinaldo Raddle, RN, BSN  Trauma/Neuro ICU Case Manager [phone]

## 2015-04-19 NOTE — Progress Notes (Signed)
PULMONARY / CRITICAL CARE MEDICINE   Name: Kerry [name] MRN: [id number] DOB: May 07, 1956    ADMISSION DATE:  04/11/2015 CONSULTATION DATE:  04/11/2015  REFERRING MD : Neurology  CHIEF COMPLAINT:  Rt sided weakness  INITIAL PRESENTATION:  59 y/o female smoker presented with Rt sided weakness and difficulty with speech from LT MCA CVA s/p limited dosing of tPA and embolectomy with stent.  Remained on vent post-procedure and PCCM consulted.  STUDIES:  10/19 CT head >> Lt MCA infarct 10/20 ECHO >> EF 55 to 60%  SIGNIFICANT EVENTS: 10/19 Admit, limited tPA, embolectomy with stenting 10/20 start 3% NS 10/21 Bradycardia from propofol, fever 10/26 trach, peg, & bronch w/ BAL  SUBJECTIVE: Patient underwent bedside placement of percutaneous tracheostomy yesterday. On visual inspection there were yellow secretions coming from bilateral lower lobes. Lavage was performed on the secretions and sent for culture.  REVIEW OF SYSTEMS: Unable to assess as patient is currently altered.  VITAL SIGNS: Temp:  [99.5 F (37.5 C)-100.4 F (38 C)] 99.7 F (37.6 C) (10/27 0800) Pulse Rate:  [40-83] 50 (10/27 1102) Resp:  [12-26] 24 (10/27 1102) BP: (67-166)/(45-78) 150/56 mmHg (10/27 1102) SpO2:  [100 %] 100 % (10/27 1102) FiO2 (%):  [30 %-100 %] 40 % (10/27 1102) Weight:  [206 lb 5.6 oz (93.6 kg)] 206 lb 5.6 oz (93.6 kg) (10/27 0500) HEMODYNAMICS: CVP:  [8 mmHg-23 mmHg] 10 mmHg VENTILATOR SETTINGS: Vent Mode:  [-] PSV FiO2 (%):  [30 %-100 %] 40 % Set Rate:  [16 bmp] 16 bmp Vt Set:  [480 mL] 480 mL PEEP:  [5 cmH20] 5 cmH20 Pressure Support:  [8 cmH20] 8 cmH20 Plateau Pressure:  [16 cmH20-20 cmH20] 18 cmH20 INTAKE / OUTPUT:  Intake/Output Summary (Last 24 hours) at 04/19/15 1136 Last data filed at 04/19/15 0800  Gross per 24 hour  Intake 1590.63 ml  Output    739 ml  Net 851.63 ml    PHYSICAL EXAMINATION: General: Obese female. Sedated. No distress.  Integument:  Warm & dry. No  rash on exposed skin.  HEENT:  Moist mucus membranes. No scleral injection or icterus. Tracheostomy in place. PERRL. Cardiovascular:  Regular rate. No edema.  Pulmonary:  Coarse breath sounds bilaterally. Symmetric chest wall rise on ventilator. Abdomen: Soft. Normal bowel sounds. Protuberant. PEG tube in place. Neurological: Patient spontaneously moves extremities. Does not follow commands. Withdraws primarily in her bilateral lower extremities. Patient does have Babinski sign present within right lower extremity.   LABS:  CBC  Recent Labs Lab 04/15/15 0310 04/17/15 0330 04/19/15 0325  WBC 10.2 10.6* 9.6  HGB 11.1* 10.9* 10.3*  HCT 35.8* 35.7* 34.2*  PLT 143* 151 178   Coag's No results for input(s): APTT, INR in the last 168 hours. BMET  Recent Labs Lab 04/17/15 0330  04/18/15 0330  04/18/15 2100 04/19/15 0325 04/19/15 0925  NA 159*  < > 157*  < > 154* 155* 158*  K 3.4*  --  3.2*  --   --  3.5  --   CL 125*  --  117*  --   --  122*  --   CO2 28  --  27  --   --  26  --   BUN 20  --  26*  --   --  21*  --   CREATININE 0.41*  --  0.50  --   --  0.52  --   GLUCOSE 209*  --  237*  --   --  159*  --   < > = values in this interval not displayed.   Electrolytes  Recent Labs Lab 04/15/15 0310  04/17/15 0330 04/18/15 0330 04/19/15 0325  CALCIUM 8.8*  < > 8.8* 8.6* 8.3*  MG 2.0  --  2.1  --  2.1  PHOS  --   --  3.3  --  3.5  < > = values in this interval not displayed. ABG No results for input(s): PHART, PCO2ART, PO2ART in the last 168 hours. Liver Enzymes  Recent Labs Lab 04/13/15 0240  AST 21  ALT 22  ALKPHOS 52  BILITOT 0.6  ALBUMIN 2.6*   Glucose  Recent Labs Lab 04/18/15 1504 04/18/15 1938 04/19/15 0005 04/19/15 0336 04/19/15 0722 04/19/15 1113  GLUCAP 146* 131* 152* 139* 130* 103*    Imaging Dg Chest Port 1 View  04/19/2015  CLINICAL DATA:  Respiratory failure, history of CVA, breast malignancy,and diabetes EXAM: PORTABLE CHEST 1 VIEW  COMPARISON:  Portable chest x-ray of April 18, 2015 FINDINGS: The left lung is well-expanded and clear. On the right there is new increased density at the lung base which partially obscures the lateral aspect of the hemidiaphragm. The interstitial markings on the right are more conspicuous. The cardiac silhouette is enlarged. The pulmonary vascularity is not clearly engorged. The tracheostomy appliance tip projects at the level of the inferior margin of the clavicular heads. The right internal jugular venous catheter tip projects over the distal portion of the SVC. IMPRESSION: New alveolar opacity at the right lung base may reflect the sequelae of aspiration. Loculated pleural fluid could present similarly. This is a significant change since yesterday's study. Mild prominence of the pulmonary interstitial markings on the right has developed as well which may reflect asymmetric interstitial edema or early interstitial pneumonia. Electronically Signed   By: David  Martinique M.D.   On: 04/19/2015 08:00   Dg Chest Port 1 View  04/18/2015  CLINICAL DATA:  Tracheostomy tube placement. EXAM: PORTABLE CHEST 1 VIEW COMPARISON:  04/17/2015 and prior radiographs FINDINGS: A tracheostomy tube is identified without definite complicating features. Interval removal of an endotracheal tube and NG tube noted. A right IJ central venous catheter is present with tip overlying the superior cavoatrial junction. Mild cardiomegaly again noted. Mild bibasilar atelectasis again identified. There is no evidence pneumothorax. IMPRESSION: Tracheostomy tube placement without definite complicating features. Endotracheal and NG tubes removed. Mild bibasilar atelectasis. Electronically Signed   By: Margarette Canada M.D.   On: 04/18/2015 15:21     ASSESSMENT / PLAN: NEUROLOGIC A:   Lt MCA infarct - s/p limited tPA and embolectomy with stenting Cytotoxic edema  P:   RASS goal: 0 Fentanyl gtt for pain Serial neuro exams Further management  per Neurology   PULMONARY ETT 10/19 >>10/26 Percutaneous Trach 10/26>> A: Compromised airway in setting of CVA HCAP - BAL 10/26 pending Tobacco abuse  P:   Repeat CXR AM Awaiting culture results S/P Perc Trach 10/26 Albuterol neb q6hr for secretions  CARDIOVASCULAR Rt IJ CVL 10/20 >> A:  Malignant HTN - resolved.  Hx of CAD, HTN, PAD, HLD. Bradycardia 10/20 from diprivan  P:  Continue pravachol & lisinopril Amlodipine stopped 10/27 Aspirin stopped 10/27 Off Nicardipine gtt BP management per Neurology Labetalol & Hydralazine IV prn  RENAL A:   Hypernatremia - medically induced. Improving. Hypokalemia - resolved  P:   Monitoring UOP with foley Monitoring renal function with daily BUN/Creatinine Electrolytes daily Continuing BID KCl via PEG tube for now Na  monitoring q6hr per Neurology  GASTROINTESTINAL A:   S/P PEG  P:   Nutrition consult for tube feeding recommendations Protonix via tube  HEMATOLOGIC A:  Anemia - No signs of active bleeding.  P:  Trending Hgb daily w/ CBC SCDs Heparin [place] q8hr  INFECTIOUS A:   Probable HCAP  P:   Monitor for fever & leukocytosis Repeat CXR AM 10/28  BAL (10/26)>>  ENDOCRINE A:  H/O DM  P:   Lantus 15 units qhs Accuchecks q6hr SSI coverage  FAMILY DISCUSSION:  No family at bedside this morning.  TODAY'S SUMMARY:  59 year old female status post left MCA infarct. Patient underwent PEG tube placement and percutaneous tracheostomy yesterday. Hypernatremia seems to be improving off of 3% hypertonic saline. Per surgery okay to use PEG tube for medications and tube feedings. Catapres & amlodipine have been discontinued as well as aspirin. Defer to primary service and further management of blood pressure and antiplatelet agents. Continuing pressure support wean. Concern for the probability of healthcare associated pneumonia given findings of secretions on bronchoscopy yesterday. According lavage cultures.  I  have spent a total of 32 minutes of critical care time today caring for the patient and reviewing the patient's electronic medical record.  Sonia Baller Ashok Cordia, M.D. [facility] Pager:  [phone] After 3pm or if no response, call [phone]  04/19/2015, 11:36 AM

## 2015-04-19 NOTE — Progress Notes (Signed)
Dear Doctor: Kerry [name] This patient has been identified as a candidate for PICC for the following reason (s): IV therapy over 48 hours and poor veins/poor circulatory system (CHF, COPD, emphysema, diabetes, steroid use, IV drug abuse, etc.) If you agree, please write an order for the indicated device. For any questions contact the Vascular Access Team at [phone] if no answer, please leave a message.  Thank you for supporting the early vascular access assessment program.

## 2015-04-19 NOTE — Progress Notes (Signed)
Nutrition Follow-up  DOCUMENTATION CODES:   Obesity unspecified  INTERVENTION:   Continue:  Vital High Protein to 30 ml/hr via PEG tube.   60 ml Prostat BID.   Tube feeding regimen provides 1120 kcal, 123 grams of protein, and 601 ml of H2O.    NUTRITION DIAGNOSIS:   Inadequate oral intake related to inability to eat as evidenced by NPO status. Ongoing.   GOAL:   Provide needs based on ASPEN/SCCM guidelines Not met.   MONITOR:   I & O's, Vent status, Labs, TF tolerance, Weight trends  REASON FOR ASSESSMENT:   Consult Enteral/tube feeding initiation and management  ASSESSMENT:   Pt admitted on 10/19 w/ acute left MCA territory CVA. Admitted to the ICU on full ventilator support s/p partial dose systemic TPA; and arterial embolectomy w/ ICA stent placement. Post intervention CT chest noted Possible left temporal ICH.   Patient is currently on ventilator support MV: 10.1 L/min Temp (24hrs), Avg:99.9 F (37.7 C), Min:99.3 F (37.4 C), Max:100.4 F (38 C)  Pt s/p trach and PEG 10/26. Consult received to re-start TF. Pt remains on vent at this time.  Medications reviewed and include: KCl Labs reviewed: Sodium elevated (158) - medically induced  Discussed with nursing.   Diet Order:  Diet NPO time specified  Skin:  Reviewed, no issues  Last BM:  unknown - dulcolax ordered  Height:   Ht Readings from Last 1 Encounters:  04/11/15 5' 6"  (1.676 m)   Weight:   Wt Readings from Last 1 Encounters:  04/19/15 206 lb 5.6 oz (93.6 kg)  admission weight: 193 lb (87.8 kg) 10/19 (BMI: 31.2)  Ideal Body Weight:  59 kg  BMI:  Body mass index is 33.32 kg/(m^2).  Estimated Nutritional Needs:   Kcal:  [phone]  Protein:  118 grams  Fluid:  > 1.5 L/day  EDUCATION NEEDS:   No education needs identified at this time  [place], [place], [place] Pager [phone] After Hours Pager

## 2015-04-19 NOTE — Progress Notes (Signed)
[facility] Progression Note   Patient Details Name: Kerry [name] MRN: [id number] DOB: [birth year] Today's Date: 04/19/2015   Tracheostomy Assessment    Tracheostomy Shiley 6 mm Cuffed (Active)  Status Secured 04/19/2015 12:27 PM  Site Assessment Clean;Dry 04/19/2015 12:27 PM  Site Care Cleansed;Dried;Dressing applied 04/19/2015  3:00 AM  Inner Cannula Care Changed/new 04/18/2015  2:44 PM  Ties Assessment Clean;Dry;Secure 04/19/2015 12:27 PM  Cuff pressure (cm) 25 cm 04/19/2015 12:37 AM  Emergency Equipment at bedside Yes 04/19/2015 12:27 PM     Care Needs Suture removal post-op day #7 : 04/25/15 (sutures need to be removed 04/25/15)   Respiratory Therapy O2 Device: Ventilator FiO2 (%): 40 % SpO2: 100 % Education: Ongoing Follow up recommendations: will follow for progression Respiratory barriers to progression:  (vent dependent)    Speech Language Pathology  SLP chart review complete: Patient ready for SLP services, Request order for PMSV evaluation (request order for pmv for 10/28 if pt alert)   Physical Therapy PT Recommendation/Assessment: Patient needs continued PT services Follow Up Recommendations: CIR, Supervision/Assistance - 24 hour PT equipment: Other (comment) (TBA)    Occupational Therapy OT Recommendation/Assessment: Patient needs continued OT Services Follow Up Recommendations: CIR, Supervision/Assistance - 24 hour OT Equipment: 3 in 1 bedside comode, Tub/shower bench    Nutritional Patient's Current Diet: NPO Tube Feeding: Vital High Protein Tube Feeding Frequency: Continuous Tube Feeding Strength: Full strength           [name], Katherene Ponto 04/19/2015, 2:12 PM

## 2015-04-19 NOTE — Progress Notes (Signed)
Occupational Therapy Evaluation Patient Details Name: Kerry [name] MRN: [id number] DOB: [birth year] Today's Date: 04/19/2015    History of Present Illness 59 y.o. female with a history of diabetes mellitus, hypertension and hyperlipidemia brought to the emergency room following acute onset of speech difficulty as well as right facial and extremity weakness.Dense left MCA was noted, indicative of acute thrombus. Patient was deemed a candidate for TPA which was started. She had repeated problems with hypertension. Complete dose of TPA was not administered. Patient was taken to interventional radiology for further management following a repeat CT scan of her head to rule out intracranial hemorrhage after worsening of right hemiparesis and development of lethargy. Repeat study showed no signs of acute intracranial hemorrhage. Study did show early signs of possible MCA territory stroke involving left temporal and frontoparietal regions as well as left basal ganglia area.   Clinical Impression   PTA, pt independent with mobility and ADL and was very active per family. Pt presents with significant functional deficits as listed below. Pt did keep eyes open with facilitation @ 70% of session. VSS (HR 46 sitting).  Able to immitate gesture with L hand after multiple repetitions. Able to wash mouth/eyes after hand over hand to initiate. Pt appears aphasic and apraxic. At this time, recommend CIR. Family very supportive. Will follow acutely to address established goals.     Follow Up Recommendations  CIR;Supervision/Assistance - 24 hour    Equipment Recommendations  3 in 1 bedside comode;Tub/shower bench    Recommendations for Other Services Rehab consult     Precautions / Restrictions Precautions Precautions: Fall;Other (comment) (trach/peg)      Mobility Bed Mobility Overal bed mobility: +2 for physical assistance;Needs Assistance Bed Mobility: Supine to Sit;Sit to Supine     Supine to  sit: +2 for physical assistance;Total assist Sit to supine: +2 for physical assistance;Total assist      Transfers                      Balance Overall balance assessment: Needs assistance   Sitting balance-Leahy Scale: Zero                                      ADL Overall ADL's : Needs assistance/impaired                                     Functional mobility during ADLs:  (total A +2 to move to EOB using helicopter technique) General ADL Comments: total A at this time for ADL. Able to wash mouth/eyes after hand over hand to initiate and gestural cues     Vision Vision Assessment?: Yes;Vision impaired- to be further tested in functional context Eye Alignment: Within Functional Limits Ocular Range of Motion: Impaired-to be further tested in functional context Alignment/Gaze Preference: Other (comment) (able to track into R field spontaneously but prefer L gaze) Tracking/Visual Pursuits: Impaired - to be further tested in functional context (not tracking) Saccades: Impaired - to be further tested in functional context Convergence: Impaired - to be further tested in functional context Visual Fields: Impaired-to be further tested in functional context Additional Comments: when eyes open, able to briefly sustain visual attention but appears to prefer L gaze. Not follwoing commands to track   Perception Perception Comments: will further assess   Praxis Praxis  Praxis tested?: Deficits Deficits: Initiation;Perseveration;Ideomotor;Limb apraxia Praxis-Other Comments: Able to imitate geture after multiple trials; trying to wave L hand. able to show "1 finger"; does better with getural cues    Pertinent Vitals/Pain Pain Assessment: Faces Pain Score: 2  Faces Pain Scale: Hurts a little bit Pain Location: with painful stimulation L trap Pain Descriptors / Indicators: Grimacing Pain Intervention(s): Limited activity within patient's  tolerance;Monitored during session     Hand Dominance Right   Extremity/Trunk Assessment Upper Extremity Assessment Upper Extremity Assessment: RUE deficits/detail RUE Deficits / Details: flaccid. no active movement. Brunstrom stagge 1 RUE Sensation:  (unable to assess. did not resopnd to noxious stimuli RUE) RUE Coordination: decreased fine motor;decreased gross motor (nonfunctional)   Lower Extremity Assessment Lower Extremity Assessment: RLE deficits/detail;LLE deficits/detail RLE Deficits / Details: flaccid. no active movement noted. LLE Deficits / Details: Able to hold LLE in extension sitting EOB   Cervical / Trunk Assessment Cervical / Trunk Assessment: Other exceptions Cervical / Trunk Exceptions: R bias; kyphotic; minimal initiation of head control EOB; post pelvic tilt   Communication Communication Communication: Tracheostomy   Cognition Arousal/Alertness: Lethargic Behavior During Therapy: Flat affect Overall Cognitive Status: Impaired/Different from baseline Area of Impairment: Following commands       Following Commands:  (not following commands)       General Comments: not following verbal commands. Pt able to imitate gesture of showing 1 finger after multiple repetitions. Pt appeared to wave after gestural cues.    General Comments       Exercises Exercises: Other exercises Other Exercises Other Exercises: Educated family on R elbow/hand/wrist PROM and need to keep RUE elevated on 2 pillows   Shoulder Instructions      Home Living Family/patient expects to be discharged to:: Private residence Living Arrangements: Spouse/significant other Available Help at Discharge: Family;Available 24 hours/day (Works every other week for [employer] can provide 24 ) Type of Home: House Home Access: Stairs to enter [employer] of Steps: 3 Entrance Stairs-Rails: Right;Left;Can reach both Home Layout: Two level;Able to live on main level with  bedroom/bathroom     Bathroom Shower/Tub: Tub/shower unit;Curtain   Bathroom Toilet: Standard Bathroom Accessibility: Yes   Home Equipment: None   Additional Comments: Pt drove as well.      Prior Functioning/Environment Level of Independence: Independent             OT Diagnosis: Generalized weakness;Cognitive deficits;Disturbance of vision;Hemiplegia dominant side;Apraxia   OT Problem List: Decreased strength;Decreased range of motion;Decreased activity tolerance;Impaired balance (sitting and/or standing);Impaired vision/perception;Decreased coordination;Decreased cognition;Decreased safety awareness;Decreased knowledge of use of DME or AE;Cardiopulmonary status limiting activity;Impaired sensation;Impaired tone;Impaired UE functional use   OT Treatment/Interventions: Self-care/ADL training;Therapeutic exercise;Neuromuscular education;DME and/or AE instruction;Splinting;Therapeutic activities;Cognitive remediation/compensation;Visual/perceptual remediation/compensation;Patient/family education;Balance training    OT Goals(Current goals can be found in the care plan section) Acute Rehab OT Goals Patient Stated Goal: family  goal is for pt to go to rehab OT Goal Formulation: With family Time For Goal Achievement: 05/03/15 Potential to Achieve Goals: Good ADL Goals Pt Will Perform Grooming: with mod assist;sitting Additional ADL Goal #1: Pt will maintain static sitting balance with mod A in preparation for ADL Additional ADL Goal #2: Pt will complete simple ADL task with min gestural cues  Additional ADL Goal #3: Family will complete PROM and positioning  of RUE with S  OT Frequency: Min 2X/week   Barriers to D/C:            Co-evaluation PT/OT/SLP Co-Evaluation/Treatment: Yes Reason for  Co-Treatment: Complexity of the patient's impairments (multi-system involvement);For patient/therapist safety   OT goals addressed during session: ADL's and  self-care;Strengthening/ROM      End of Session Equipment Utilized During Treatment: Other (comment);Oxygen (vent) Nurse Communication: Mobility status  Activity Tolerance: Patient tolerated treatment well Patient left: in bed;with call bell/phone within reach;with family/visitor present;with SCD's reapplied;with restraints reapplied   Time: 2778-2423 OT Time Calculation (min): 43 min Charges:  OT General Charges $OT Visit: 1 Procedure OT Evaluation $Initial OT Evaluation Tier I: 1 Procedure $OT Re-eval: 1 Procedure G-Codes:    Liliauna Santoni,HILLARY 21-Apr-2015, 11:21 AM   Maurie Boettcher, OTR/L  [phone] 21-Apr-2015

## 2015-04-19 NOTE — Progress Notes (Signed)
Will continue PS trial throughout day as tolerated.  Will hold on ATC trial per MD due to episodes of bradycardia. Pt tolerating PS, RT will continue to monitor.

## 2015-04-20 ENCOUNTER — Inpatient Hospital Stay (HOSPITAL_COMMUNITY): Payer: 59

## 2015-04-20 ENCOUNTER — Encounter (HOSPITAL_COMMUNITY): Payer: Self-pay | Admitting: General Surgery

## 2015-04-20 DIAGNOSIS — J189 Pneumonia, unspecified organism: Secondary | ICD-10-CM

## 2015-04-20 DIAGNOSIS — I63032 Cerebral infarction due to thrombosis of left carotid artery: Secondary | ICD-10-CM

## 2015-04-20 DIAGNOSIS — R4701 Aphasia: Secondary | ICD-10-CM

## 2015-04-20 LAB — GLUCOSE, CAPILLARY
GLUCOSE-CAPILLARY: 180 mg/dL — AB (ref 65–99)
GLUCOSE-CAPILLARY: 206 mg/dL — AB (ref 65–99)
GLUCOSE-CAPILLARY: 230 mg/dL — AB (ref 65–99)
GLUCOSE-CAPILLARY: 262 mg/dL — AB (ref 65–99)
Glucose-Capillary: 228 mg/dL — ABNORMAL HIGH (ref 65–99)
Glucose-Capillary: 248 mg/dL — ABNORMAL HIGH (ref 65–99)
Glucose-Capillary: 210 mg/dL — ABNORMAL HIGH (ref 65–99)

## 2015-04-20 LAB — CBC WITH DIFFERENTIAL/PLATELET
BASOS ABS: 0 10*3/uL (ref 0.0–0.1)
BASOS PCT: 0 %
EOS ABS: 0.1 10*3/uL (ref 0.0–0.7)
Eosinophils Relative: 1 %
HEMATOCRIT: 31.1 % — AB (ref 36.0–46.0)
HEMOGLOBIN: 9.3 g/dL — AB (ref 12.0–15.0)
Lymphocytes Relative: 21 %
Lymphs Abs: 2 10*3/uL (ref 0.7–4.0)
MCH: 27.9 pg (ref 26.0–34.0)
MCHC: 29.9 g/dL — AB (ref 30.0–36.0)
MCV: 93.4 fL (ref 78.0–100.0)
MONOS PCT: 7 %
Monocytes Absolute: 0.7 10*3/uL (ref 0.1–1.0)
NEUTROS ABS: 6.7 10*3/uL (ref 1.7–7.7)
NEUTROS PCT: 71 %
Platelets: 175 10*3/uL (ref 150–400)
RBC: 3.33 MIL/uL — AB (ref 3.87–5.11)
RDW: 13.3 % (ref 11.5–15.5)
WBC: 9.5 10*3/uL (ref 4.0–10.5)

## 2015-04-20 LAB — RENAL FUNCTION PANEL
ANION GAP: 8 (ref 5–15)
Albumin: 1.8 g/dL — ABNORMAL LOW (ref 3.5–5.0)
BUN: 20 mg/dL (ref 6–20)
CALCIUM: 8.2 mg/dL — AB (ref 8.9–10.3)
CO2: 26 mmol/L (ref 22–32)
Chloride: 120 mmol/L — ABNORMAL HIGH (ref 101–111)
Creatinine, Ser: 0.64 mg/dL (ref 0.44–1.00)
Glucose, Bld: 233 mg/dL — ABNORMAL HIGH (ref 65–99)
Phosphorus: 2.7 mg/dL (ref 2.5–4.6)
Potassium: 3 mmol/L — ABNORMAL LOW (ref 3.5–5.1)
SODIUM: 154 mmol/L — AB (ref 135–145)

## 2015-04-20 LAB — URINE CULTURE: SPECIAL REQUESTS: NORMAL

## 2015-04-20 LAB — SODIUM
SODIUM: 149 mmol/L — AB (ref 135–145)
SODIUM: 148 mmol/L — AB (ref 135–145)
SODIUM: 157 mmol/L — AB (ref 135–145)

## 2015-04-20 LAB — MAGNESIUM: MAGNESIUM: 2.1 mg/dL (ref 1.7–2.4)

## 2015-04-20 MED ORDER — VANCOMYCIN HCL 10 G IV SOLR
2000.0000 mg | Freq: Once | INTRAVENOUS | Status: AC
  Administered 2015-04-20: 2000 mg via INTRAVENOUS
  Filled 2015-04-20: qty 2000

## 2015-04-20 MED ORDER — VANCOMYCIN HCL IN DEXTROSE 1-5 GM/200ML-% IV SOLN
1000.0000 mg | Freq: Three times a day (TID) | INTRAVENOUS | Status: DC
  Administered 2015-04-20 – 2015-04-21 (×2): 1000 mg via INTRAVENOUS
  Filled 2015-04-20 (×4): qty 200

## 2015-04-20 MED ORDER — SODIUM CHLORIDE 0.9 % IV SOLN
INTRAVENOUS | Status: DC
  Administered 2015-04-20 – 2015-04-27 (×5): via INTRAVENOUS

## 2015-04-20 MED ORDER — LISINOPRIL 20 MG PO TABS
20.0000 mg | ORAL_TABLET | Freq: Two times a day (BID) | ORAL | Status: DC
  Administered 2015-04-20 – 2015-04-27 (×14): 20 mg via ORAL
  Filled 2015-04-20 (×15): qty 1

## 2015-04-20 MED ORDER — AMLODIPINE BESYLATE 10 MG PO TABS
10.0000 mg | ORAL_TABLET | Freq: Every day | ORAL | Status: DC
  Administered 2015-04-20 – 2015-04-27 (×8): 10 mg via ORAL
  Filled 2015-04-20 (×8): qty 1

## 2015-04-20 MED ORDER — FENTANYL CITRATE (PF) 100 MCG/2ML IJ SOLN
50.0000 ug | INTRAMUSCULAR | Status: DC | PRN
  Administered 2015-04-20: 50 ug via INTRAVENOUS
  Filled 2015-04-20: qty 2

## 2015-04-20 MED ORDER — INSULIN GLARGINE 100 UNIT/ML ~~LOC~~ SOLN
30.0000 [IU] | Freq: Every day | SUBCUTANEOUS | Status: DC
  Administered 2015-04-20 – 2015-04-26 (×7): 30 [IU] via SUBCUTANEOUS
  Filled 2015-04-20 (×9): qty 0.3

## 2015-04-20 MED ORDER — INSULIN NPH (HUMAN) (ISOPHANE) 100 UNIT/ML ~~LOC~~ SUSP
10.0000 [IU] | Freq: Once | SUBCUTANEOUS | Status: AC
  Administered 2015-04-20: 10 [IU] via SUBCUTANEOUS
  Filled 2015-04-20: qty 10

## 2015-04-20 MED ORDER — CEFTRIAXONE SODIUM 1 G IJ SOLR
1.0000 g | INTRAMUSCULAR | Status: DC
  Administered 2015-04-20 – 2015-04-21 (×2): 1 g via INTRAVENOUS
  Filled 2015-04-20 (×2): qty 10

## 2015-04-20 NOTE — Progress Notes (Signed)
ANTIBIOTIC CONSULT NOTE - INITIAL  Pharmacy Consult for vancomycin Indication: pneumonia  Allergies  Allergen Reactions  . Oxycodone     Sick   . Oxycontin [Oxycodone Hcl]     Sick     Patient Measurements: Height: 5\' 6"  (167.6 cm) Weight: 203 lb 7.8 oz (92.3 kg) IBW/kg (Calculated) : 59.3  Vital Signs: Temp: 99.5 F (37.5 C) (10/28 1100) BP: 154/51 mmHg (10/28 1100) Pulse Rate: 64 (10/28 1100) Intake/Output from previous day: 10/27 0701 - 10/28 0700 In: 1101 [I.V.:131; NG/GT:970] Out: 1675 [Urine:1675] Intake/Output from this shift: Total I/O In: 322 [I.V.:2; NG/GT:320] Out: 150 [Urine:150]  Labs:  Recent Labs  04/18/15 0330 04/19/15 0325 04/20/15 0347  WBC  --  9.6 9.5  HGB  --  10.3* 9.3*  PLT  --  178 175  CREATININE 0.50 0.52 0.64   Estimated Creatinine Clearance: 88.8 mL/min (by C-G formula based on Cr of 0.64). No results for input(s): VANCOTROUGH, VANCOPEAK, VANCORANDOM, GENTTROUGH, GENTPEAK, GENTRANDOM, TOBRATROUGH, TOBRAPEAK, TOBRARND, AMIKACINPEAK, AMIKACINTROU, AMIKACIN in the last 72 hours.   Microbiology: Recent Results (from the past 720 hour(s))  MRSA PCR Screening     Status: None   Collection Time: 04/11/15  1:58 PM  Result Value Ref Range Status   MRSA by PCR NEGATIVE NEGATIVE Final    Comment:        The GeneXpert MRSA Assay (FDA approved for NASAL specimens only), is one component of a comprehensive MRSA colonization surveillance program. It is not intended to diagnose MRSA infection nor to guide or monitor treatment for MRSA infections.   Urine culture     Status: None   Collection Time: 04/18/15  9:40 AM  Result Value Ref Range Status   Specimen Description URINE, CATHETERIZED  Final   Special Requests Normal  Final   Culture >=100,000 COLONIES/mL ESCHERICHIA COLI  Final   Report Status 04/20/2015 FINAL  Final   Organism ID, Bacteria ESCHERICHIA COLI  Final      Susceptibility   Escherichia coli - MIC*    AMPICILLIN <=2  SENSITIVE Sensitive     CEFAZOLIN <=4 SENSITIVE Sensitive     CEFTRIAXONE <=1 SENSITIVE Sensitive     CIPROFLOXACIN <=0.25 SENSITIVE Sensitive     GENTAMICIN <=1 SENSITIVE Sensitive     IMIPENEM <=0.25 SENSITIVE Sensitive     NITROFURANTOIN <=16 SENSITIVE Sensitive     TRIMETH/SULFA <=20 SENSITIVE Sensitive     AMPICILLIN/SULBACTAM <=2 SENSITIVE Sensitive     PIP/TAZO <=4 SENSITIVE Sensitive     * >=100,000 COLONIES/mL ESCHERICHIA COLI  Culture, respiratory (NON-Expectorated)     Status: None (Preliminary result)   Collection Time: 04/18/15  3:08 PM  Result Value Ref Range Status   Specimen Description TRACHEAL ASPIRATE  Final   Special Requests Normal  Final   Gram Stain   Final    MODERATE WBC PRESENT,BOTH PMN AND MONONUCLEAR RARE SQUAMOUS EPITHELIAL CELLS PRESENT RARE GRAM POSITIVE COCCI IN PAIRS IN CLUSTERS RARE GRAM NEGATIVE RODS Performed at [facility]    Culture   Final    FEW STAPHYLOCOCCUS AUREUS Note: RIFAMPIN AND GENTAMICIN SHOULD NOT BE USED AS SINGLE DRUGS FOR TREATMENT OF STAPH INFECTIONS. Performed at [facility]    Report Status PENDING  Incomplete    Medical History: Past Medical History  Diagnosis Date  . Hypertension   . Coronary artery disease   . Diabetes mellitus   . Breast cancer ([place]) 03/10/2007    Left breat  . Degenerative disc disease,  cervical   . Degenerative disc disease, lumbar   . Fibromyalgia   . Hyperlipidemia   . Carpal tunnel syndrome on right     Medications:  Anti-infectives    Start     Dose/Rate Route Frequency Ordered Stop   04/20/15 2100  vancomycin (VANCOCIN) IVPB 1000 mg/200 mL premix     1,000 mg 200 mL/hr over 60 Minutes Intravenous Every 8 hours 04/20/15 1119     04/20/15 1200  vancomycin (VANCOCIN) 2,000 mg in sodium chloride 0.9 % 500 mL IVPB     2,000 mg 250 mL/hr over 120 Minutes Intravenous  Once 04/20/15 1119       Assessment: 14 yof presented to the hospital with R sided weakness. Now to  start vancomycin for possible pneumonia. Tmax is 100.8 and WBC is WNL. Scr is 0.64.  Vanc 10/28>>  10/26 Urine - EColi 10/26 TA - few staph aureus  Goal of Therapy:  Vancomycin trough level 15-20 mcg/ml  Plan:  - vancomycin 2gm IV x 1 then 1gm IV Q8H - F/u renal fxn, C&S, clinical status and trough at [place], Rande Lawman 04/20/2015,11:21 AM

## 2015-04-20 NOTE — Progress Notes (Addendum)
STROKE TEAM PROGRESS NOTE   SUBJECTIVE (INTERVAL HISTORY) Husband is at bedside. On trach and PEG. Drowsy sleepy, BP elevated on cardene briefly last night. Na 154 down from 159 after free water. Otherwise, no significant change.   OBJECTIVE Temp:  [99 F (37.2 C)-100.8 F (38.2 C)] 99.5 F (37.5 C) (10/28 0800) Pulse Rate:  [46-85] 51 (10/28 0800) Cardiac Rhythm:  [-] Sinus bradycardia (10/28 0740) Resp:  [17-29] 23 (10/28 0800) BP: (128-187)/(46-81) 179/63 mmHg (10/28 0948) SpO2:  [98 %-100 %] 100 % (10/28 0800) FiO2 (%):  [40 %] 40 % (10/28 0800) Weight:  [203 lb 7.8 oz (92.3 kg)] 203 lb 7.8 oz (92.3 kg) (10/28 0451)  CBC:   Recent Labs Lab 04/19/15 0325 04/20/15 0347  WBC 9.6 9.5  NEUTROABS  --  6.7  HGB 10.3* 9.3*  HCT 34.2* 31.1*  MCV 94.5 93.4  PLT 178 712    Basic Metabolic Panel:   Recent Labs Lab 04/19/15 0325  04/19/15 2145 04/20/15 0347  NA 155*  < > 151* 154*  K 3.5  --   --  3.0*  CL 122*  --   --  120*  CO2 26  --   --  26  GLUCOSE 159*  --   --  233*  BUN 21*  --   --  20  CREATININE 0.52  --   --  0.64  CALCIUM 8.3*  --   --  8.2*  MG 2.1  --   --  2.1  PHOS 3.5  --   --  2.7  < > = values in this interval not displayed.  Lipid Panel:     Component Value Date/Time   CHOL 208* 04/12/2015 0315   TRIG 231* 04/12/2015 0315   HDL 29* 04/12/2015 0315   CHOLHDL 7.2 04/12/2015 0315   VLDL 46* 04/12/2015 0315   LDLCALC 133* 04/12/2015 0315   HgbA1c:  Lab Results  Component Value Date   HGBA1C 11.4* 04/12/2015   Urine Drug Screen: No results found for: LABOPIA, COCAINSCRNUR, LABBENZ, AMPHETMU, THCU, LABBARB    IMAGING I have personally reviewed the radiological images below and agree with the radiology interpretations.  Ct Head Wo Contrast  04/17/2015  1. Mild interval evolution of large left MCA territory infarct, with associated edema. Slight rightward midline shift, measuring 2-3 mm, with partial effacement of the left lateral  ventricle. Scattered foci of associated hemorrhage are perhaps slightly more diffuse than on the prior study. 2. Mild small vessel ischemic microangiopathy.  04/12/2105  1. Large acute left MCA territory infarct with resolving associated hemorrhage. Similar local mass effect without midline shift. 2. No new intracranial process.  04/12/2015  Acute large LEFT MCA territory infarct with similar patchy areas of hemorrhage. Local mass effect without midline shift. Mildly dense intracranial vessels most compatible with residual contrast.   04/11/2015  Acute hemorrhage in the left temporal lobe. This may be within the sylvian fissure rather than the insular cortex. There are small gas bubbles in the area of hemorrhage which may be extra vascular gas in the subarachnoid space. This is likely related to the procedure and micro perforation of a vessel. Acute infarct left temporal lobe and possibly left basal ganglia. There is local edema without midline shift   04/11/2015  Slightly more conspicuous loss of gray-white differentiation affecting the left temporal lobe and frontoparietal regions in the left basal ganglia consistent with acute left MCA infarction. Aspects score difficult because of some motion, but estimated  at 5 or 6.   04/11/2015  CT findings consistent with early left MCA infarct with a dense left MCA sign. No mass effect or hemorrhage.   2D Echocardiogram   - Left ventricle: The cavity size was normal. Systolic function wasnormal. The estimated ejection fraction was in the range of 55%to 60%. Wall motion was normal; there were no regional wallmotion abnormalities. - Aortic valve: Valve area (VTI): 2.24 cm^2. Valve area (Vmax): 2cm^2. Valve area (Vmean): 2.04 cm^2. - Atrial septum: No defect or patent foramen ovale was identified.    Physical Exam  General: The patient is intubated, drowsy, open eyes on pain stimulation, tracking but not following simple commands. Off  fentanyl.  Respiratory: Clear lung fields  Cardiovascular: Regular rate and rhythm  Abdomen: Soft, nontender, positive bowel sounds  Skin: 1+ edema in the legs noted..  Neurologic Exam Had trach on PS, drowsy, open eyes on pain stimulation, tracking with eyes but not following simple commands. Both eyes conjugated and tracking objects on the left, pupil left 3.83mm and right 34mm, both sluggish to light, difference remained same at light or dark. Doll's eyes present. Right facial droop. Positive corneal and gag. Left UE localized to pain, right UE 0/5 on pain. Left LE against gravity, RLE 2/5 on pain stimulation. Reflex 1+ and right babinski positive.   ASSESSMENT/PLAN [name] is a 59 y.o. female with history of diabetes mellitus, hypertension, hyperlipidemia and fibromyalgia presenting with new onset speech difficulty and right hemiparesis. She received IV t-PA (total 50 mg, 19 mg not received) due to elevated BP. 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   Resultant  Respiratory failure, right hemiparesis, cerebral edema  Repeat CT head large L MCA infarct, mild midline shift  2D Echo  EF 55-60%, no source of embolus  LDL 133  HgbA1c 11.4    for VTE prophylaxis Diet NPO time specified  No antithrombotic prior to admission.now on aspirin suppository.   Ongoing aggressive stroke risk factor management  PEG and trach done on 04/18/15  Therapy recommendations:  pending   Disposition:  pending   Cytotoxic cerebral edema  Repeat CT showed mild midline shift  Started 3% saline 04/13/2015, off 04/16/15  Na 159 -> 157 -> 155 ->159 -> 154 -> 149  change free water  to NS  Continue Na monitoring  Carotid stenosis  L ICA occlusion  S/p acute stent placement ([facility]. [facility] Con[facility])  Switched from ASA to plavix for carotid stenting after procedure  Acute respiratory Failure  Intubated for procedure  Unable to protect airway  S/p trach, now on CPAP with PS  Malignant Hypertension  BP 179/82 on arrival, as high as 213/97  Treated with cardene drip, off   BP on the high side  Increase lisinopril to 20mg  bid  Hyperlipidemia  Home meds:  pravachol 40  LDL 133, goal < 70  Resumed home meds  Continue statin at discharge  Bradycardia - likely due to carotid stent placement  Down to the 30s both pre-IR and post CL placement  BB not recommended  Cardiology has seen, external pacemaker placed and then removed 04/16/15  HR stable  Diabetes type II  HgbA1c 11.4 , goal < 7.0  On  lantus 30 u daily PTA, on tube feeding, resume lantus 30 unit.  SSI  CBG monitoring  Other Stroke Risk Factors  Cigarette smoker, quit smoking  ETOH use  Obesity, Body mass index is 32.86 kg/(m^2).   Coronary artery disease  Noncompliance   L femoral bypass  Other Active Problems  Hx breast cancer  AKI - Cr 0.5  Hospital day # 9  This patient is critically ill due to left MCA malignant infarct, left ICA occlusion s/p stenting, respiratory failure needing intubation, bradycardia after stent and at significant risk of neurological worsening, death form brain herniation, heart failure, arrhythmia, hemorrhagic transformation. This patient's care requires constant monitoring of vital signs, hemodynamics, respiratory and cardiac monitoring, review of multiple databases, neurological assessment, discussion with family, other specialists and medical decision making of high complexity. Discussed with pt husband at bedside. Updated family about pt condition, answered their questions. I spent 35 minutes of neurocritical care time in the care of this  patient.   Rosalin Hawking, MD PhD Stroke Neurology 04/20/2015 10:07 AM        To contact Stroke Continuity provider, please refer to [email or web address] After hours, contact General Neurology

## 2015-04-20 NOTE — Progress Notes (Addendum)
PULMONARY / CRITICAL CARE MEDICINE   Name: Kerry [name] MRN: [id number] DOB: Sep 28, 1955    ADMISSION DATE:  04/11/2015 CONSULTATION DATE:  04/11/2015  REFERRING MD : Neurology  CHIEF COMPLAINT:  Rt sided weakness  INITIAL PRESENTATION:  59 y/o female smoker presented with Rt sided weakness and difficulty with speech from LT MCA CVA s/p limited dosing of tPA and embolectomy with stent.  Remained on vent post-procedure and PCCM consulted.  STUDIES:  10/19 CT head >> Lt MCA infarct 10/20 ECHO >> EF 55 to 60%  SIGNIFICANT EVENTS: 10/19 Admit, limited tPA, embolectomy with stenting 10/20 start 3% NS 10/21 Bradycardia from propofol, fever 10/26 trach, peg, & bronch w/ BAL 10/28 started Vanc for HCAP  SUBJECTIVE: No acute events overnight. Patient with eyes open this morning. Fentanyl drip stopped this morning.  REVIEW OF SYSTEMS: Unable to assess as patient is currently altered.  VITAL SIGNS: Temp:  [99 F (37.2 C)-100.8 F (38.2 C)] 99.5 F (37.5 C) (10/28 1045) Pulse Rate:  [46-85] 62 (10/28 1045) Resp:  [17-29] 24 (10/28 1045) BP: (128-187)/(46-81) 163/50 mmHg (10/28 1045) SpO2:  [98 %-100 %] 100 % (10/28 1045) FiO2 (%):  [40 %] 40 % (10/28 0800) Weight:  [203 lb 7.8 oz (92.3 kg)] 203 lb 7.8 oz (92.3 kg) (10/28 0451) HEMODYNAMICS: CVP:  [5 mmHg-10 mmHg] 8 mmHg VENTILATOR SETTINGS: Vent Mode:  [-] PSV FiO2 (%):  [40 %] 40 % Set Rate:  [16 bmp] 16 bmp Vt Set:  [480 mL] 480 mL PEEP:  [5 cmH20] 5 cmH20 Pressure Support:  [8 cmH20] 8 cmH20 Plateau Pressure:  [17 cmH20-19 cmH20] 19 cmH20 INTAKE / OUTPUT:  Intake/Output Summary (Last 24 hours) at 04/20/15 1103 Last data filed at 04/20/15 1000  Gross per 24 hour  Intake 1313.5 ml  Output   1825 ml  Net -511.5 ml    PHYSICAL EXAMINATION: General: Obese female. More awake. Eyes open. Integument:  Warm & dry. No rash on exposed skin.  HEENT:  No scleral injection or icterus. Tracheostomy in place. PERRL. right  IJ in place. Cardiovascular:  Regular rate. No edema. Unable to appreciate JVD. Pulmonary:  Coarse breath sounds bilaterally. Symmetric chest wall rise on ventilator. Abdomen: Soft. Normal bowel sounds. Protuberant. PEG tube in place. Neurological: Patient's eyes are open. Spontaneously moving left lower extremity. Spontaneously moves left upper extremity. Does not follow commands or track.  LABS:  CBC  Recent Labs Lab 04/17/15 0330 04/19/15 0325 04/20/15 0347  WBC 10.6* 9.6 9.5  HGB 10.9* 10.3* 9.3*  HCT 35.7* 34.2* 31.1*  PLT 151 178 175   Coag's No results for input(s): APTT, INR in the last 168 hours. BMET  Recent Labs Lab 04/18/15 0330  04/19/15 0325  04/19/15 2145 04/20/15 0347 04/20/15 1004  NA 157*  < > 155*  < > 151* 154* 149*  K 3.2*  --  3.5  --   --  3.0*  --   CL 117*  --  122*  --   --  120*  --   CO2 27  --  26  --   --  26  --   BUN 26*  --  21*  --   --  20  --   CREATININE 0.50  --  0.52  --   --  0.64  --   GLUCOSE 237*  --  159*  --   --  233*  --   < > = values in this interval not displayed.  Electrolytes  Recent Labs Lab 04/17/15 0330 04/18/15 0330 04/19/15 0325 04/20/15 0347  CALCIUM 8.8* 8.6* 8.3* 8.2*  MG 2.1  --  2.1 2.1  PHOS 3.3  --  3.5 2.7   ABG No results for input(s): PHART, PCO2ART, PO2ART in the last 168 hours. Liver Enzymes  Recent Labs Lab 04/20/15 0347  ALBUMIN 1.8*   Glucose  Recent Labs Lab 04/19/15 1113 04/19/15 1520 04/19/15 1939 04/19/15 2341 04/20/15 0346 04/20/15 0849  GLUCAP 103* 180* 188* 262* 206* 228*    Imaging Dg Chest Port 1 View  04/20/2015  CLINICAL DATA:  CHF EXAM: PORTABLE CHEST 1 VIEW COMPARISON:  04/19/2015 FINDINGS: Right IJ central line, tip at the upper cavoatrial junction. Tracheostomy tube remains well seated. Unchanged dense opacity at the right base with fairly well-defined margins. Left lung is comparatively clear. Stable mild cardiomegaly. No pneumothorax. IMPRESSION:  Progressive right basilar opacity, primarily concerning for pneumonia. Loculated pleural fluid with atelectasis could have a similar appearance. Electronically Signed   By: Monte Fantasia M.D.   On: 04/20/2015 08:01     ASSESSMENT / PLAN: NEUROLOGIC A:   Lt MCA infarct - s/p limited tPA and embolectomy with stenting Cytotoxic edema  P:   Plavix daily RASS goal: 0 Fentanyl IV prn for pain Versed IV prn Further management per Neurology   PULMONARY ETT 10/19 >>10/26 Percutaneous Trach 10/26>> A: Compromised airway in setting of CVA HCAP - BAL final ctx pending. Staph aureus Tobacco abuse  P:   Checking CT Chest w/o Awaiting culture results S/P Perc Trach 10/26 Albuterol neb q6hr for secretions  CARDIOVASCULAR Rt IJ CVL 10/20 >> A:  Malignant HTN - resolved.  Hx of CAD, HTN, PAD, HLD. Bradycardia - pacer removed. Previously evaluated by cardiology.  P:  Continue pravachol  Lisinopril now bid Norvasc daily Plavix daily Aspirin stopped 10/27 Off Nicardipine gtt BP management per Neurology Labetalol & Hydralazine IV prn  RENAL A:   Hypernatremia - Medically induced. Improving. Hypokalemia - Replacing via tube  P:   KCl via peg tube bid Monitoring UOP with foley Monitoring renal function with daily BUN/Creatinine Electrolytes daily Na monitoring q6hr per Neurology  GASTROINTESTINAL A:   S/P PEG  P:   Continuing Tube feedings Protonix via tube  HEMATOLOGIC A:  Anemia - Trending down. No signs of active bleeding.  P:  Trending Hgb daily w/ CBC SCDs Heparin [place] q8hr  INFECTIOUS A:   HCAP - Staphylococcus aureus with sensitivities pending. E coli UTI  P: Starting Vancomycin with pharmacy dosing Rocephin 1gm IV q24hr for UTI Change foley catheter Checking CT Chest w/o Trending leukocyte count.  BAL (10/26)>> Staph aureus Urine Ctx (10/26) - E coli  ENDOCRINE A:  H/O DM  P:   NPH 10 units [place] once Lantus 30 units qhs Accuchecks  q4hr SSI coverage  FAMILY DISCUSSION:  No family at bedside this morning at the time of my exam.  TODAY'S SUMMARY:  59 year old female status post left MCA infarct. Patient underwent PEG tube placement and percutaneous tracheostomy 10/26. Neurology service is increasing lisinopril for blood pressure control. Patient has resumed her home Lantus which will help with control of hyperglycemia in the setting of tube feedings. I am initiating vancomycin empirically for antibiotic coverage given the Staphylococcus aureus from the lavage performed on 10/26. I'm also checking a CT scan of the chest to better evaluate the infiltrate seen on portable chest x-ray.  I have spent a total of 34 minutes of critical  care time today caring for the patient and reviewing the patient's electronic medical record.  Sonia Baller Ashok Cordia, M.D. [facility] Pager:  [phone] After 3pm or if no response, call [phone]  04/20/2015, 11:03 AM

## 2015-04-20 NOTE — Progress Notes (Signed)
I will follow up once pt is weaned . [phone]

## 2015-04-20 NOTE — Progress Notes (Signed)
MD would like to see patient tolerate PS of 0 before advancing to ATC trials, dec to to 5 at this time, pt tolerating well, RT will monitor

## 2015-04-20 NOTE — Progress Notes (Signed)
Wasted 100 mL of Fentanyl.  Witnessed by Shara Blazing, RN

## 2015-04-21 DIAGNOSIS — E876 Hypokalemia: Secondary | ICD-10-CM | POA: Insufficient documentation

## 2015-04-21 DIAGNOSIS — J9601 Acute respiratory failure with hypoxia: Secondary | ICD-10-CM

## 2015-04-21 DIAGNOSIS — I63512 Cerebral infarction due to unspecified occlusion or stenosis of left middle cerebral artery: Secondary | ICD-10-CM

## 2015-04-21 LAB — CBC WITH DIFFERENTIAL/PLATELET
BASOS ABS: 0 10*3/uL (ref 0.0–0.1)
Basophils Relative: 0 %
EOS PCT: 2 %
Eosinophils Absolute: 0.2 10*3/uL (ref 0.0–0.7)
HEMATOCRIT: 31.4 % — AB (ref 36.0–46.0)
Hemoglobin: 9.8 g/dL — ABNORMAL LOW (ref 12.0–15.0)
LYMPHS PCT: 19 %
Lymphs Abs: 1.9 10*3/uL (ref 0.7–4.0)
MCH: 29.2 pg (ref 26.0–34.0)
MCHC: 31.2 g/dL (ref 30.0–36.0)
MCV: 93.5 fL (ref 78.0–100.0)
MONO ABS: 0.7 10*3/uL (ref 0.1–1.0)
MONOS PCT: 7 %
NEUTROS ABS: 7.6 10*3/uL (ref 1.7–7.7)
Neutrophils Relative %: 72 %
PLATELETS: 165 10*3/uL (ref 150–400)
RBC: 3.36 MIL/uL — ABNORMAL LOW (ref 3.87–5.11)
RDW: 13.4 % (ref 11.5–15.5)
WBC: 10.5 10*3/uL (ref 4.0–10.5)

## 2015-04-21 LAB — RENAL FUNCTION PANEL
ALBUMIN: 1.8 g/dL — AB (ref 3.5–5.0)
ANION GAP: 8 (ref 5–15)
BUN: 17 mg/dL (ref 6–20)
CO2: 26 mmol/L (ref 22–32)
Calcium: 8.5 mg/dL — ABNORMAL LOW (ref 8.9–10.3)
Chloride: 119 mmol/L — ABNORMAL HIGH (ref 101–111)
Creatinine, Ser: 0.49 mg/dL (ref 0.44–1.00)
GFR calc Af Amer: >60 mL/min (ref >60)
GFR calc non Af Amer: >60 mL/min (ref >60)
GLUCOSE: 174 mg/dL — AB (ref 65–99)
PHOSPHORUS: 2.8 mg/dL (ref 2.5–4.6)
POTASSIUM: 3.2 mmol/L — AB (ref 3.5–5.1)
SODIUM: 153 mmol/L — AB (ref 135–145)

## 2015-04-21 LAB — SODIUM
Sodium: 150 mmol/L — ABNORMAL HIGH (ref 135–145)
Sodium: 152 mmol/L — ABNORMAL HIGH (ref 135–145)
Sodium: 150 mmol/L — ABNORMAL HIGH (ref 135–145)

## 2015-04-21 LAB — CULTURE, RESPIRATORY: SPECIAL REQUESTS: NORMAL

## 2015-04-21 LAB — GLUCOSE, CAPILLARY
GLUCOSE-CAPILLARY: 200 mg/dL — AB (ref 65–99)
Glucose-Capillary: 151 mg/dL — ABNORMAL HIGH (ref 65–99)
Glucose-Capillary: 208 mg/dL — ABNORMAL HIGH (ref 65–99)
Glucose-Capillary: 222 mg/dL — ABNORMAL HIGH (ref 65–99)
Glucose-Capillary: 202 mg/dL — ABNORMAL HIGH (ref 65–99)

## 2015-04-21 LAB — CULTURE, RESPIRATORY W GRAM STAIN

## 2015-04-21 MED ORDER — HYDRALAZINE HCL 25 MG PO TABS
25.0000 mg | ORAL_TABLET | Freq: Three times a day (TID) | ORAL | Status: DC
  Administered 2015-04-21 – 2015-04-22 (×3): 25 mg via ORAL
  Filled 2015-04-21 (×3): qty 1

## 2015-04-21 MED ORDER — SODIUM CHLORIDE 0.9 % IJ SOLN
10.0000 mL | Freq: Two times a day (BID) | INTRAMUSCULAR | Status: DC
  Administered 2015-04-21 – 2015-04-27 (×11): 10 mL

## 2015-04-21 MED ORDER — HYDRALAZINE HCL 20 MG/ML IJ SOLN: 10.0000 mg | Freq: Once | INTRAMUSCULAR | Status: AC

## 2015-04-21 MED ORDER — POTASSIUM CHLORIDE 10 MEQ/100ML IV SOLN
10.0000 meq | INTRAVENOUS | Status: AC
  Administered 2015-04-21 (×3): 10 meq via INTRAVENOUS
  Filled 2015-04-21 (×3): qty 100

## 2015-04-21 MED ORDER — CEFAZOLIN SODIUM-DEXTROSE 2-3 GM-% IV SOLR
2.0000 g | Freq: Three times a day (TID) | INTRAVENOUS | Status: DC
  Administered 2015-04-21 – 2015-04-26 (×15): 2 g via INTRAVENOUS
  Filled 2015-04-21 (×17): qty 50

## 2015-04-21 MED ORDER — SODIUM CHLORIDE 0.9 % IJ SOLN: 10.0000 mL | INTRAMUSCULAR | Status: DC | PRN

## 2015-04-21 MED ORDER — HYDRALAZINE HCL 20 MG/ML IJ SOLN
10.0000 mg | INTRAMUSCULAR | Status: DC | PRN
  Administered 2015-04-21 – 2015-04-23 (×6): 10 mg via INTRAVENOUS
  Filled 2015-04-21 (×6): qty 1

## 2015-04-21 MED ORDER — VITAL AF 1.2 CAL PO LIQD
1000.0000 mL | ORAL | Status: DC
  Administered 2015-04-21 – 2015-04-27 (×5): 1000 mL
  Filled 2015-04-21 (×10): qty 1000

## 2015-04-21 NOTE — Progress Notes (Addendum)
STROKE TEAM PROGRESS NOTE  HISTORY  Kerry [name] is an 59 y.o. female with a history of diabetes mellitus, hypertension and hyperlipidemia brought to the emergency room following acute onset of speech difficulty as well as right facial and extremity weakness. Patient has no previous history of stroke nor TIA. She has not been on antiplatelet therapy. CT scan of her head showed no signs of acute hemorrhage. Dense left MCA was noted, indicative of acute thrombus. Patient was deemed a candidate for TPA which was started. She had repeated problems with hypertension requiring intervention with IV labetalol as well as hydralazine. Complete dose of TPA was not administered. Patient was taken to interventional radiology for further management following a repeat CT scan of her head to rule out intracranial hemorrhage after worsening of right hemiparesis and development of lethargy. Repeat study showed no signs of acute intracranial hemorrhage. Study did show early signs of possible MCA territory stroke involving left temporal and frontoparietal regions as well as left basal ganglia area.  LSN: 6:30 AM on 04/11/2015 tPA Given: Yes mRankin:   SUBJECTIVE (INTERVAL HISTORY) No events overnight.  PICC line placed this AM  OBJECTIVE Temp:  [98.7 F (37.1 C)-99.9 F (37.7 C)] 99.5 F (37.5 C) (10/29 0722) Pulse Rate:  [49-91] 65 (10/29 0926) Cardiac Rhythm:  [-] Sinus bradycardia (10/29 0800) Resp:  [16-30] 26 (10/29 0926) BP: (120-206)/(41-109) 206/85 mmHg (10/29 0933) SpO2:  [99 %-100 %] 100 % (10/29 0926) FiO2 (%):  [40 %] 40 % (10/29 0926) Weight:  [92.7 kg (204 lb 5.9 oz)] 92.7 kg (204 lb 5.9 oz) (10/29 0500)  CBC:   Recent Labs Lab 04/20/15 0347 04/21/15 0357  WBC 9.5 10.5  NEUTROABS 6.7 7.6  HGB 9.3* 9.8*  HCT 31.1* 31.4*  MCV 93.4 93.5  PLT 175 397    Basic Metabolic Panel:   Recent Labs Lab 04/19/15 0325  04/20/15 0347  04/20/15 2120 04/21/15 0357  NA 155*  < > 154*  < >  157* 153*  K 3.5  --  3.0*  --   --  3.2*  CL 122*  --  120*  --   --  119*  CO2 26  --  26  --   --  26  GLUCOSE 159*  --  233*  --   --  174*  BUN 21*  --  20  --   --  17  CREATININE 0.52  --  0.64  --   --  0.49  CALCIUM 8.3*  --  8.2*  --   --  8.5*  MG 2.1  --  2.1  --   --   --   PHOS 3.5  --  2.7  --   --  2.8  < > = values in this interval not displayed.  Lipid Panel:     Component Value Date/Time   CHOL 208* 04/12/2015 0315   TRIG 231* 04/12/2015 0315   HDL 29* 04/12/2015 0315   CHOLHDL 7.2 04/12/2015 0315   VLDL 46* 04/12/2015 0315   LDLCALC 133* 04/12/2015 0315   HgbA1c:  Lab Results  Component Value Date   HGBA1C 11.4* 04/12/2015   Urine Drug Screen: No results found for: LABOPIA, COCAINSCRNUR, LABBENZ, AMPHETMU, THCU, LABBARB    IMAGING I have personally reviewed the radiological images below and agree with the radiology interpretations.  Ct Head Wo Contrast  04/17/2015  1. Mild interval evolution of large left MCA territory infarct, with associated edema. Slight rightward  midline shift, measuring 2-3 mm, with partial effacement of the left lateral ventricle. Scattered foci of associated hemorrhage are perhaps slightly more diffuse than on the prior study. 2. Mild small vessel ischemic microangiopathy.  04/12/2105  1. Large acute left MCA territory infarct with resolving associated hemorrhage. Similar local mass effect without midline shift. 2. No new intracranial process.  04/12/2015  Acute large LEFT MCA territory infarct with similar patchy areas of hemorrhage. Local mass effect without midline shift. Mildly dense intracranial vessels most compatible with residual contrast.   04/11/2015  Acute hemorrhage in the left temporal lobe. This may be within the sylvian fissure rather than the insular cortex. There are small gas bubbles in the area of hemorrhage which may be extra vascular gas in the subarachnoid space. This is likely related to the procedure and  micro perforation of a vessel. Acute infarct left temporal lobe and possibly left basal ganglia. There is local edema without midline shift   04/11/2015  Slightly more conspicuous loss of gray-white differentiation affecting the left temporal lobe and frontoparietal regions in the left basal ganglia consistent with acute left MCA infarction. Aspects score difficult because of some motion, but estimated at 5 or 6.   04/11/2015  CT findings consistent with early left MCA infarct with a dense left MCA sign. No mass effect or hemorrhage.   2D Echocardiogram   - Left ventricle: The cavity size was normal. Systolic function wasnormal. The estimated ejection fraction was in the range of 55%to 60%. Wall motion was normal; there were no regional wallmotion abnormalities. - Aortic valve: Valve area (VTI): 2.24 cm^2. Valve area (Vmax): 2cm^2. Valve area (Vmean): 2.04 cm^2. - Atrial septum: No defect or patent foramen ovale was identified.    Physical Exam  General: The patient is intubated, drowsy, open eyes on pain stimulation, tracking but not following simple commands. Off fentanyl. HEENT:  NCAT; Sclera clear; intubated  Respiratory: Clear lung fields  Cardiovascular: Regular rate and rhythm  Abdomen: Soft, nontender, positive bowel sounds  Skin: 1+ edema in the legs noted..  Neurologic Exam Had trach on PS, drowsy, open eyes on pain stimulation, tracking with eyes but not following simple commands. Both eyes conjugated and tracking objects on the left, pupil left 3.54mm and right 38mm, both sluggish to light, difference remained same at light or dark. Doll's eyes present. Right facial droop. Positive corneal and gag. Left UE localized to pain, right UE 0/5 on pain. Left LE against gravity, RLE 2/5 on pain stimulation. Reflex 1+ and right babinski positive.   ASSESSMENT/PLAN [name] is a 59 y.o. female with history of diabetes mellitus, hypertension, hyperlipidemia and  fibromyalgia presenting with new onset speech difficulty and right hemiparesis. She received IV t-PA (total 50 mg, 19 mg not received) due to elevated BP. 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   Resultant  Respiratory failure, right hemiparesis, cerebral edema  Repeat CT head large L MCA infarct, mild midline shift  2D Echo  EF 55-60%, no source of embolus  LDL 133  HgbA1c 11.4    VTE prophylaxis - subcutaneous heparin Diet NPO time specified  No antithrombotic prior to admission.now on aspirin suppository.   Ongoing aggressive stroke risk factor management  PEG and trach done on 04/18/15  Therapy recommendations:  pending   Disposition:  pending   Cytotoxic cerebral edema  Repeat CT showed mild midline shift  Started 3% saline 04/13/2015, off 04/16/15  Na 159 -> 157 -> 155 ->159 -> 154 -> 149  change free water to NS  Continue Na monitoring  Carotid stenosis  L ICA occlusion  S/p acute stent placement (Dr. Arizona Constable at [facility])  Switched from ASA to plavix for carotid stenting after procedure  Acute respiratory Failure  Intubated for procedure  Unable to protect airway  S/p trach, now on CPAP with PS  Malignant Hypertension  BP 179/82 on arrival, as high as 213/97  Treated with cardene drip, off   BP on the high side  Increase lisinopril to 20mg  bid  Hyperlipidemia  Home meds:  pravachol 40  LDL 133, goal < 70  Resumed home meds  Continue statin at discharge  Bradycardia - likely due to carotid stent placement  Down to the 30s both pre-IR and post CL placement  BB not recommended  Cardiology has seen, external pacemaker placed and then removed  04/16/15  HR stable  Diabetes type II  HgbA1c 11.4 , goal < 7.0  On lantus 30 u daily PTA, on tube feeding, resume lantus 30 unit.  SSI  CBG monitoring  Other Stroke Risk Factors  Cigarette smoker, quit smoking  ETOH use  Obesity, Body mass index is 33 kg/(m^2).   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   Cytotoxic cerebral edema - hypertonic saline  Cardiac  Hypertension history - Permissive hypertension <220/120 for 24-48 hours and then gradually normalize within 5-7 days  Amlodipine 10 mg daily, hydralazine prn, lisinopril 20 mg daily, Cardene infusion  Added Hydralazine 25mg  TID  Pulmonary  Chest CT 04/20/2015 - Consolidation at the RIGHT lung base suggest pneumonia. Recommend follow-up contrast chest CT to  exclude underlying mass lesion. Small bilateral pleural effusions. No pulmonary edema  CXR in AM  GI  PEG  Tube feeds  GU   I/O 2602 / 1040 = 1562  Endo  1. Diabetes mellitus - hemoglobin A1c 11.4  2. Sliding scale insulin  Checking TSH in AM  Heme / ID  1. Vancomycina day #2.   Rocephin day #2.  Ax Temp - 99.5 today  WBCs 10.5  Anemia H/H 9.8 / 31.4  Electrolytes  Sodium 153  Potassium 3.2 - KCL - ordered  CRITICAL CARE NEUROLOGY ATTENDING NOTE Patient was seen and examined by me personally. I reviewed notes, independently viewed imaging studies, participated in medical decision making and plan of care. I have made additions or clarifications directly to the above note.  Documentation accurately reflects findings. The  laboratory and radiographic studies were personally reviewed by me.  ROS pertinent positives could not be fully documented due to LOC  Assessment and plan completed by me personally and fully documented above.  Condition is unchanged    This patient is critically ill and at significant risk of neurological worsening, death and care requires constant monitoring of vital signs, hemodynamics,respiratory and cardiac monitoring,  extensive review of multiple databases, frequent neurological assessment, discussion with family, other specialists and medical decision making of high complexity.  This critical care time does not reflect procedure time, or teaching time or supervisory time of PA/NP/Med Resident etc. but could involve care discussion time.  I spent 30 minutes of Neurocritical Care time in the care of  this patient.  SIGNED BY: Dr. Elissa Hefty       To contact Stroke Continuity provider, please refer to [email or web address] After hours, contact General Neurology

## 2015-04-21 NOTE — Progress Notes (Addendum)
PULMONARY / CRITICAL CARE MEDICINE   Name: Kerry [name] MRN: [id number] DOB: Nov 18, 1955    ADMISSION DATE:  04/11/2015 CONSULTATION DATE:  04/11/2015  REFERRING MD : Neurology  CHIEF COMPLAINT:  Rt sided weakness  INITIAL PRESENTATION:  59 y/o female smoker presented with Rt sided weakness and difficulty with speech from LT MCA CVA s/p limited dosing of tPA and embolectomy with stent.  Remained on vent post-procedure and PCCM consulted.  STUDIES:  10/19 CT head >> Lt MCA infarct 10/20 ECHO >> EF 55 to 60%  SIGNIFICANT EVENTS: 10/19 Admit, limited tPA, embolectomy with stenting 10/20 start 3% NS 10/21 Bradycardia from propofol, fever 10/26 trach, peg, & bronch w/ BAL 10/28 started Vanc for HCAP  SUBJECTIVE: [place]  REVIEW OF SYSTEMS: Unable to assess as patient is currently altered.  VITAL SIGNS: Temp:  [98.7 F (37.1 C)-99.9 F (37.7 C)] 99.5 F (37.5 C) (10/29 0722) Pulse Rate:  [49-91] 60 (10/29 1009) Resp:  [16-30] 25 (10/29 1009) BP: (120-206)/(41-109) 156/59 mmHg (10/29 1009) SpO2:  [99 %-100 %] 100 % (10/29 1009) FiO2 (%):  [40 %] 40 % (10/29 0926) Weight:  [204 lb 5.9 oz (92.7 kg)] 204 lb 5.9 oz (92.7 kg) (10/29 0500) HEMODYNAMICS: CVP:  [4 mmHg-8 mmHg] 4 mmHg VENTILATOR SETTINGS: Vent Mode:  [-] PSV;CPAP FiO2 (%):  [40 %] 40 % Set Rate:  [16 bmp] 16 bmp Vt Set:  [480 mL] 480 mL PEEP:  [5 cmH20] 5 cmH20 Pressure Support:  [5 cmH20] 5 cmH20 Plateau Pressure:  [15 cmH20-16 cmH20] 16 cmH20 INTAKE / OUTPUT:  Intake/Output Summary (Last 24 hours) at 04/21/15 1040 Last data filed at 04/21/15 1000  Gross per 24 hour  Intake   2592 ml  Output    890 ml  Net   1702 ml    PHYSICAL EXAMINATION: General: Obese female. More awake. Eyes open. Integument:  Warm & dry. No rash on exposed skin.  HEENT:  No scleral injection or icterus. Tracheostomy in place. PERRL. right IJ in place. Cardiovascular:  Regular rate. No edema. Unable to appreciate  JVD. Pulmonary:  Coarse breath sounds bilaterally. Symmetric chest wall rise on ventilator. Abdomen: Soft. Normal bowel sounds. Protuberant. PEG tube in place. Neurological: No follows commands. Spontaneously moving left lower extremity. Spontaneously moves left upper extremity. Does not follow commands or track.  LABS:  CBC  Recent Labs Lab 04/19/15 0325 04/20/15 0347 04/21/15 0357  WBC 9.6 9.5 10.5  HGB 10.3* 9.3* 9.8*  HCT 34.2* 31.1* 31.4*  PLT 178 175 165   Coag's No results for input(s): APTT, INR in the last 168 hours. BMET  Recent Labs Lab 04/19/15 0325  04/20/15 0347  04/20/15 2120 04/21/15 0357 04/21/15 1003  NA 155*  < > 154*  < > 157* 153* 150*  K 3.5  --  3.0*  --   --  3.2*  --   CL 122*  --  120*  --   --  119*  --   CO2 26  --  26  --   --  26  --   BUN 21*  --  20  --   --  17  --   CREATININE 0.52  --  0.64  --   --  0.49  --   GLUCOSE 159*  --  233*  --   --  174*  --   < > = values in this interval not displayed.   Electrolytes  Recent Labs Lab 04/17/15 0330  04/19/15 0325 04/20/15 0347 04/21/15 0357  CALCIUM 8.8*  < > 8.3* 8.2* 8.5*  MG 2.1  --  2.1 2.1  --   PHOS 3.3  --  3.5 2.7 2.8  < > = values in this interval not displayed. ABG No results for input(s): PHART, PCO2ART, PO2ART in the last 168 hours. Liver Enzymes  Recent Labs Lab 04/20/15 0347 04/21/15 0357  ALBUMIN 1.8* 1.8*   Glucose  Recent Labs Lab 04/20/15 1157 04/20/15 1636 04/20/15 1958 04/20/15 2324 04/21/15 0319 04/21/15 0728  GLUCAP 248* 210* 180* 230* 151* 208*    Imaging Ct Chest Wo Contrast  04/20/2015  CLINICAL DATA:  Respiratory distress. Follow-up infiltrates seen on prior chest x-ray. EXAM: CT CHEST WITHOUT CONTRAST TECHNIQUE: Multidetector CT imaging of the chest was performed following the standard protocol without IV contrast. COMPARISON:  Radiograph [id number] FINDINGS: Mediastinum/Nodes: No axillary supraclavicular lymphadenopathy. Tracheostomy tube  in good position. No mediastinal hilar lymphadenopathy. No pericardial. Esophagus normal. Lungs/Pleura: Consolidation at the RIGHT lung base with several air bronchograms concerning for pneumonia (image 37, series 201. There is bilateral small effusions. No pulmonary edema. No pneumothorax. Upper abdomen: Limited view of the liver, kidneys, pancreas are unremarkable. Normal adrenal glands. Musculoskeletal: No aggressive osseous lesion. IMPRESSION: 1. Consolidation at the RIGHT lung base suggest pneumonia. Recommend follow-up contrast chest CT to exclude underlying mass lesion. 2. Small bilateral pleural effusions. 3. No pulmonary edema Electronically Signed   By: Suzy Bouchard M.D.   On: 04/20/2015 17:32     ASSESSMENT / PLAN: NEUROLOGIC A:   Lt MCA infarct - s/p limited tPA and embolectomy with stenting Cytotoxic edema  P:   Plavix daily RASS goal: 0 Fentanyl IV prn for pain Versed IV prn Further management per Neurology   PULMONARY ETT 10/19 >>10/26 Percutaneous Trach 10/26>> A: Compromised airway in setting of CVA RLL HCAP - BAL final ctx pending. Staph aureus Tobacco abuse  P:   S/P Perc Trach 10/26 Albuterol neb q6hr for secretions Tolerating PSV, will attempt to transition to ATC today  CARDIOVASCULAR Rt IJ CVL 10/20 >> A:  Malignant HTN - resolved.  Hx of CAD, HTN, PAD, HLD. Bradycardia - pacer removed. Previously evaluated by cardiology.  P:  Continue pravachol  Lisinopril now bid Norvasc daily Plavix daily Aspirin stopped 10/27 Off Nicardipine gtt BP management per Neurology Labetalol & Hydralazine IV prn  RENAL A:   Hypernatremia - Medically induced. Improving. Hypokalemia - Replacing via tube  P:   KCl via peg tube bid Monitoring UOP with foley Monitoring renal function with daily BUN/Creatinine Electrolytes daily Na monitoring q6hr per Neurology  GASTROINTESTINAL A:   S/P PEG  P:   Continuing Tube feedings Protonix via  tube  HEMATOLOGIC A:  Anemia - Trending down. No signs of active bleeding.  P:  Trending Hgb daily w/ CBC SCDs Heparin  q8hr  INFECTIOUS A:   HCAP - MSSA E coli UTI  P: Starting Vancomycin with pharmacy dosing 10/28>> 10/29 Rocephin 1gm IV q24hr for UTI 10/28>> 10/29 Ancef 10/29 >>  Trending leukocyte count.  BAL (10/26)>> Staph aureus >> MSSA Urine Ctx (10/26) - E coli (pan-sens)  ENDOCRINE A:  H/O DM CBG (last 3)   Recent Labs  04/20/15 2324 04/21/15 0319 04/21/15 0728  GLUCAP 230* 151* 208*    P:   Lantus 30 units qhs Accuchecks q4hr SSI coverage  FAMILY DISCUSSION:  No family at bedside this morning at the time of my exam.  TODAY'S SUMMARY:  58  year old female status post left MCA infarct. Patient underwent PEG tube placement and percutaneous tracheostomy 10/26. Neurology service is increasing lisinopril for blood pressure control. Patient has resumed her home Lantus which will help with control of hyperglycemia in the setting of tube feedings.  vancomycin empirically for antibiotic coverage given the Staphylococcus aureus from the lavage performed on 10/26.also checking a CT scan of the chest to better evaluate the infiltrate seen on portable chest x-ray which may be lower lobe pna.   Richardson Landry Minor ACNP Maryanna Shape PCCM Pager [phone] till 3 pm If no answer page [phone] 04/21/2015, 10:41 AM   Attending Note:  I have examined patient, reviewed labs, studies and notes. I have discussed the case with S Minor, and I agree with the data and plans as amended above.  Baltazar Apo, MD, PhD 04/21/2015, 1:52 PM [facility] [phone] or if no answer [phone]

## 2015-04-21 NOTE — Progress Notes (Signed)
[facility] Progress Note Patient Name: Kerry [name] DOB: 1956/03/08 MRN: [id number]   Date of Service  04/21/2015  HPI/Events of Note  Continued hypertension with BP of 176/53  eICU Interventions  Increase frequency of hydralazine to 10 mg IV q4 hours prn     Intervention Category Intermediate Interventions: Hypertension - evaluation and management  Alexandro Line 04/21/2015, 5:02 AM

## 2015-04-21 NOTE — Progress Notes (Signed)
Patient placed on 28% trach collar.  Currently tolerating well.  Will continue to monitor.  

## 2015-04-21 NOTE — Progress Notes (Signed)
Nutrition Follow-up  DOCUMENTATION CODES:  Obesity unspecified  INTERVENTION:  Change TF feeding formula. Vent now on standby  Initiate Vital AF 1.2 @ 30 ml/hr via Peg and increase by 10 ml every 4 hours to goal rate of 50 ml/hr.   D/C prostat  Tube feeding regimen provides 1440 kcal (100% of needs), 90 grams of protein, and 973 ml of H2O.   NUTRITION DIAGNOSIS:  Inadequate oral intake related to inability to eat as evidenced by NPO status. Ongoing.   GOAL:  Patient will meet greater than or equal to 90% of their needs Not met.   MONITOR:  I & O's, Vent status, Labs, TF tolerance, Weight trends  REASON FOR ASSESSMENT:  Consult Very low albumin  ASSESSMENT:  Pt admitted on 10/19 w/ acute left MCA territory CVA. Admitted to the ICU on full ventilator support s/p partial dose systemic TPA; and arterial embolectomy w/ ICA stent placement. Post intervention CT chest noted Possible left temporal ICH.   Pt s/p trach and PEG 10/26.  Interval Hx as of 10/29 Vent on standby, tolerating on trach collar  Readjusted TF. Abomen soft, non distended.   Patient has PEG in place: Current TF regimen: VHP @ 30 ml/hr via PEG tube.  60 ml Prostat BID.  Tube feeding regimen provides 1120 kcal, 123 grams of protein, and 601 ml of H2O.   Note: RD consulted for very low albumin. Albumin has a half-life of 21 days and is strongly affected by stress response and inflammatory process, therefore, do not expect to see an improvement in this lab value during acute hospitalization.  Diet Order:  Diet NPO time specified  Skin:  Reviewed, no issues  Last BM:  unknown  Height:  Ht Readings from Last 1 Encounters:  04/11/15 5' 6"  (1.676 m)   Weight:  Wt Readings from Last 1 Encounters:  04/21/15 204 lb 5.9 oz (92.7 kg)   Wt Readings from Last 10 Encounters:  04/21/15 204 lb 5.9 oz (92.7 kg)  02/21/14 206 lb (93.441 kg)  11/08/12 233 lb 4.8 oz (105.824 kg)  12/17/11 234 lb 1.6 oz (106.187  kg)  09/13/11 244 lb (110.678 kg)  admission weight: 193 lb (87.8 kg) 10/19 (BMI: 31.2)  Ideal Body Weight:  59 kg  BMI:  Body mass index is 33 kg/(m^2).  Estimated Nutritional Needs:  Kcal:  1300-1600 (15-18 kcal/kg) Protein:  83-94 g (1.4-1.6 g/kg ibw) Fluid:  > 1.5 L/day  EDUCATION NEEDS:  No education needs identified at this time  Burtis Junes RD, LDN Nutrition Pager: [phone] 04/21/2015 2:26 PM

## 2015-04-21 NOTE — Progress Notes (Addendum)
MD Deterding notified of BP 176/53. Orders received to give Hydralazine 10mg  now and change PRN Hydralazine to PRN Q4.

## 2015-04-21 NOTE — Progress Notes (Signed)
Peripherally Inserted Central Catheter/Midline Placement  The IV Nurse has discussed with the patient and/or persons authorized to consent for the patient, the purpose of this procedure and the potential benefits and risks involved with this procedure.  The benefits include less needle sticks, lab draws from the catheter and patient may be discharged home with the catheter.  Risks include, but not limited to, infection, bleeding, blood clot (thrombus formation), and puncture of an artery; nerve damage and irregular heat beat.  Alternatives to this procedure were also discussed. Consent signed by husband 04/20/15.  PICC/Midline Placement Documentation  PICC / Midline Double Lumen 04/21/15 PICC Right Brachial 36 cm 0 cm (Active)  Indication for Insertion or Continuance of Line Prolonged intravenous therapies;Limited venous access - need for IV therapy >5 days (PICC only);Poor Vasculature-patient has had multiple peripheral attempts or PIVs lasting less than 24 hours 04/21/2015  9:29 AM  Exposed Catheter (cm) 0 cm 04/21/2015  9:29 AM  Site Assessment Clean;Dry;Intact 04/21/2015  9:29 AM  Lumen #1 Status Flushed;Saline locked;Blood return noted 04/21/2015  9:29 AM  Lumen #2 Status Flushed;Saline locked;Blood return noted 04/21/2015  9:29 AM  Dressing Type Transparent 04/21/2015  9:29 AM  Dressing Status Clean;Dry;Intact;Antimicrobial disc in place 04/21/2015  9:29 AM  Line Care Connections checked and tightened 04/21/2015  9:29 AM  Line Adjustment (NICU/IV Team Only) No 04/21/2015  9:29 AM  Dressing Intervention New dressing 04/21/2015  9:29 AM  Dressing Change Due 04/28/15 04/21/2015  9:29 AM       Rolena Infante 04/21/2015, 9:30 AM

## 2015-04-21 NOTE — Progress Notes (Deleted)
Patient ventilator keeps alarming on and off and patient noted with agitation and biting tube intermittently, with on and off gagging and coughing, respiratory in and suctioned multiple times obtained only small amount of secretions as well oral. Given several boluses of versed and fentanyl and relieved only for short period of time. Patient able to follow command, restarted precidex drip. Dr. Georgette Dover made aware and with order to maintain the drip and keep patient calm and sedated.

## 2015-04-22 ENCOUNTER — Inpatient Hospital Stay (HOSPITAL_COMMUNITY): Payer: 59

## 2015-04-22 LAB — CBC WITH DIFFERENTIAL/PLATELET
Basophils Absolute: 0 10*3/uL (ref 0.0–0.1)
Basophils Relative: 0 %
EOS PCT: 2 %
Eosinophils Absolute: 0.2 10*3/uL (ref 0.0–0.7)
HEMATOCRIT: 32.4 % — AB (ref 36.0–46.0)
Hemoglobin: 9.8 g/dL — ABNORMAL LOW (ref 12.0–15.0)
LYMPHS PCT: 20 %
Lymphs Abs: 2 10*3/uL (ref 0.7–4.0)
MCH: 28.2 pg (ref 26.0–34.0)
MCHC: 30.2 g/dL (ref 30.0–36.0)
MCV: 93.1 fL (ref 78.0–100.0)
MONO ABS: 0.6 10*3/uL (ref 0.1–1.0)
MONOS PCT: 6 %
NEUTROS ABS: 7.3 10*3/uL (ref 1.7–7.7)
Neutrophils Relative %: 72 %
Platelets: 194 10*3/uL (ref 150–400)
RBC: 3.48 MIL/uL — ABNORMAL LOW (ref 3.87–5.11)
RDW: 13.5 % (ref 11.5–15.5)
WBC: 10.1 10*3/uL (ref 4.0–10.5)

## 2015-04-22 LAB — GLUCOSE, CAPILLARY
GLUCOSE-CAPILLARY: 198 mg/dL — AB (ref 65–99)
GLUCOSE-CAPILLARY: 226 mg/dL — AB (ref 65–99)
GLUCOSE-CAPILLARY: 225 mg/dL — AB (ref 65–99)
GLUCOSE-CAPILLARY: 187 mg/dL — AB (ref 65–99)
Glucose-Capillary: 179 mg/dL — ABNORMAL HIGH (ref 65–99)
Glucose-Capillary: 204 mg/dL — ABNORMAL HIGH (ref 65–99)

## 2015-04-22 LAB — COMPREHENSIVE METABOLIC PANEL
ALT: 48 U/L (ref 14–54)
ANION GAP: 7 (ref 5–15)
AST: 47 U/L — ABNORMAL HIGH (ref 15–41)
Albumin: 1.8 g/dL — ABNORMAL LOW (ref 3.5–5.0)
Alkaline Phosphatase: 151 U/L — ABNORMAL HIGH (ref 38–126)
BILIRUBIN TOTAL: 0.4 mg/dL (ref 0.3–1.2)
BUN: 14 mg/dL (ref 6–20)
CO2: 26 mmol/L (ref 22–32)
Calcium: 8 mg/dL — ABNORMAL LOW (ref 8.9–10.3)
Chloride: 115 mmol/L — ABNORMAL HIGH (ref 101–111)
Creatinine, Ser: 0.53 mg/dL (ref 0.44–1.00)
Glucose, Bld: 224 mg/dL — ABNORMAL HIGH (ref 65–99)
POTASSIUM: 3.2 mmol/L — AB (ref 3.5–5.1)
Sodium: 148 mmol/L — ABNORMAL HIGH (ref 135–145)
TOTAL PROTEIN: 5.3 g/dL — AB (ref 6.5–8.1)

## 2015-04-22 LAB — SODIUM
SODIUM: 149 mmol/L — AB (ref 135–145)
SODIUM: 145 mmol/L (ref 135–145)
Sodium: 147 mmol/L — ABNORMAL HIGH (ref 135–145)

## 2015-04-22 LAB — TSH: TSH: 0.423 u[IU]/mL (ref 0.350–4.500)

## 2015-04-22 MED ORDER — POTASSIUM CHLORIDE 20 MEQ/15ML (10%) PO SOLN
20.0000 meq | ORAL | Status: AC
  Administered 2015-04-22: 20 meq via ORAL
  Filled 2015-04-22 (×2): qty 15

## 2015-04-22 MED ORDER — HYDRALAZINE HCL 50 MG PO TABS
50.0000 mg | ORAL_TABLET | Freq: Three times a day (TID) | ORAL | Status: DC
  Administered 2015-04-22 – 2015-04-27 (×16): 50 mg via ORAL
  Filled 2015-04-22 (×18): qty 1

## 2015-04-22 NOTE — Progress Notes (Signed)
PULMONARY / CRITICAL CARE MEDICINE   Name: Kerry [name] MRN: [id number] DOB: 08/17/1955    ADMISSION DATE:  04/11/2015 CONSULTATION DATE:  04/11/2015  REFERRING MD : Neurology  CHIEF COMPLAINT:  Rt sided weakness  INITIAL PRESENTATION:  59 y/o female smoker presented with Rt sided weakness and difficulty with speech from LT MCA CVA s/p limited dosing of tPA and embolectomy with stent.  Remained on vent post-procedure and PCCM consulted.  STUDIES:  10/19 CT head >> Lt MCA infarct 10/20 ECHO >> EF 55 to 60%  SIGNIFICANT EVENTS: 10/19 Admit, limited tPA, embolectomy with stenting 10/20 start 3% NS 10/21 Bradycardia from propofol, fever 10/26 trach, peg, & bronch w/ BAL 10/28 started Vanc for HCAP dc 'd for mssa 10/29 ancef>>  SUBJECTIVE: NSC, tolerating ATC overnight  REVIEW OF SYSTEMS: Unable to assess as patient is currently altered.  VITAL SIGNS: Temp:  [98.1 F (36.7 C)-99.4 F (37.4 C)] 99.4 F (37.4 C) (10/30 0725) Pulse Rate:  [55-95] 86 (10/30 0900) Resp:  [17-35] 31 (10/30 0900) BP: (130-183)/(52-87) 151/62 mmHg (10/30 0900) SpO2:  [94 %-100 %] 96 % (10/30 0900) FiO2 (%):  [28 %-40 %] 28 % (10/30 0900) Weight:  [211 lb 3.2 oz (95.8 kg)] 211 lb 3.2 oz (95.8 kg) (10/30 0500) HEMODYNAMICS:   VENTILATOR SETTINGS: Vent Mode:  [-] PSV;CPAP FiO2 (%):  [28 %-40 %] 28 % PEEP:  [5 cmH20] 5 cmH20 Pressure Support:  [5 cmH20] 5 cmH20 INTAKE / OUTPUT:  Intake/Output Summary (Last 24 hours) at 04/22/15 1024 Last data filed at 04/22/15 0900  Gross per 24 hour  Intake   2250 ml  Output      0 ml  Net   2250 ml    PHYSICAL EXAMINATION: General: Obese female. More awake. Eyes open.tracked. smiled Integument:  Warm & dry. No rash on exposed skin.  HEENT:  No scleral injection or icterus. Tracheostomy in place. PERRL. right IJ in place. Cardiovascular:  Regular rate. No edema. Unable to appreciate JVD. Pulmonary:  Coarse breath sounds bilaterally. Symmetric  chest wall rise on t collar Abdomen: Soft. Normal bowel sounds. Protuberant. PEG tube in place. Neurological: No follows commands. Spontaneously moving left lower extremity. Spontaneously moves left upper extremity. Does not follow commands or track.  LABS:  CBC  Recent Labs Lab 04/20/15 0347 04/21/15 0357 04/22/15 0423  WBC 9.5 10.5 10.1  HGB 9.3* 9.8* 9.8*  HCT 31.1* 31.4* 32.4*  PLT 175 165 194   Coag's No results for input(s): APTT, INR in the last 168 hours. BMET  Recent Labs Lab 04/20/15 0347  04/21/15 0357  04/21/15 2118 04/22/15 0423 04/22/15 0936  NA 154*  < > 153*  < > 150* 148* 149*  K 3.0*  --  3.2*  --   --  3.2*  --   CL 120*  --  119*  --   --  115*  --   CO2 26  --  26  --   --  26  --   BUN 20  --  17  --   --  14  --   CREATININE 0.64  --  0.49  --   --  0.53  --   GLUCOSE 233*  --  174*  --   --  224*  --   < > = values in this interval not displayed.   Electrolytes  Recent Labs Lab 04/17/15 0330  04/19/15 0325 04/20/15 0347 04/21/15 0357 04/22/15 0423  CALCIUM 8.8*  < >  8.3* 8.2* 8.5* 8.0*  MG 2.1  --  2.1 2.1  --   --   PHOS 3.3  --  3.5 2.7 2.8  --   < > = values in this interval not displayed. ABG No results for input(s): PHART, PCO2ART, PO2ART in the last 168 hours. Liver Enzymes  Recent Labs Lab 04/20/15 0347 04/21/15 0357 04/22/15 0423  AST  --   --  47*  ALT  --   --  48  ALKPHOS  --   --  151*  BILITOT  --   --  0.4  ALBUMIN 1.8* 1.8* 1.8*   Glucose  Recent Labs Lab 04/21/15 1117 04/21/15 1600 04/21/15 1929 04/21/15 2331 04/22/15 0324 04/22/15 0727  GLUCAP 222* 200* 202* 204* 179* 198*    Imaging Dg Chest Port 1 View  04/22/2015  CLINICAL DATA:  Vent dependent EXAM: PORTABLE CHEST 1 VIEW COMPARISON:  04/20/2015 FINDINGS: The right arm PICC line tip is in the projection of the SVC. There is a tracheostomy tube with tip in the trachea above the carina. Normal heart size. Small pleural effusions and mild  interstitial edema noted consistent with CHF. There is persistent atelectasis in both lung bases. IMPRESSION: 1. CHF. 2. Bibasilar atelectasis. Electronically Signed   By: Kerby Moors M.D.   On: 04/22/2015 07:56     ASSESSMENT / PLAN: NEUROLOGIC A:   Lt MCA infarct - s/p limited tPA and embolectomy with stenting Cytotoxic edema  P:   Plavix daily RASS goal: 0 Fentanyl IV prn for pain Versed IV prn Further management per Neurology   PULMONARY ETT 10/19 >>10/26 Percutaneous Trach 10/26>> A: Compromised airway in setting of CVA RLL HCAP - BAL final ctx pending. Staph aureus Tobacco abuse  P:   S/P Perc Trach 10/26 Albuterol neb q6hr for secretions Tolerating ATC, goal 24x7  CARDIOVASCULAR Rt IJ CVL 10/20 >> A:  Malignant HTN - resolved.  Hx of CAD, HTN, PAD, HLD. Bradycardia - pacer removed. Previously evaluated by cardiology.  P:  Continue pravachol  Lisinopril now bid Norvasc daily Plavix daily Aspirin stopped 10/27 Off Nicardipine gtt BP management per Neurology Labetalol & Hydralazine IV prn  RENAL A:   Hypernatremia - Medically induced. Improving. Hypokalemia - Replacing via tube  P:   KCl via peg tube bid Monitoring UOP with foley Monitoring renal function with daily BUN/Creatinine Electrolytes daily Na monitoring q6hr per Neurology  GASTROINTESTINAL A:   S/P PEG  P:   Continuing Tube feedings Protonix via tube  HEMATOLOGIC A:  Anemia - Trending down. No signs of active bleeding.  P:  Trending Hgb daily w/ CBC SCDs Heparin [place] q8hr  INFECTIOUS A:   HCAP - MSSA E coli UTI  P: Vancomycin with pharmacy dosing 10/28>> 10/29 Rocephin 1gm IV q24hr for UTI 10/28>> 10/29 Ancef 10/29 >>  Trending leukocyte count.  BAL (10/26)>> Staph aureus >> MSSA Urine Ctx (10/26) - E coli (pan-sens)  ENDOCRINE A:  H/O DM CBG (last 3)   Recent Labs  04/21/15 2331 04/22/15 0324 04/22/15 0727  GLUCAP 204* 179* 198*    P:   Lantus 30  units qhs Accuchecks q4hr SSI coverage  FAMILY DISCUSSION:  Updated husband and mother at bedside 10/30  TODAY'S SUMMARY:  59 year old female status post left MCA infarct. Patient underwent PEG tube placement and percutaneous tracheostomy 10/26. Neurology service is increasing lisinopril for blood pressure control. Patient has resumed her home Lantus which will help with control of hyperglycemia in  the setting of tube feedings. MSSA PNA vs bronchitis > on cefazolin.    Richardson Landry Minor ACNP [facility] PCCM Pager [phone] till 3 pm If no answer page [phone] 04/22/2015, 10:24 AM   Attending Note:  I have examined patient, reviewed labs, studies and notes. I have discussed the case with S Minor, and I agree with the data and plans as amended above. Attempting ATC 24x7  Baltazar Apo, MD, PhD 04/22/2015, 12:53 PM [facility] [phone] or if no answer [phone]

## 2015-04-22 NOTE — Progress Notes (Signed)
STROKE TEAM PROGRESS NOTE  HISTORY  Kerry [name] is an 59 y.o. female with a history of diabetes mellitus, hypertension and hyperlipidemia brought to the emergency room following acute onset of speech difficulty as well as right facial and extremity weakness. Patient has no previous history of stroke nor TIA. She has not been on antiplatelet therapy. CT scan of her head showed no signs of acute hemorrhage. Dense left MCA was noted, indicative of acute thrombus. Patient was deemed a candidate for TPA which was started. She had repeated problems with hypertension requiring intervention with IV labetalol as well as hydralazine. Complete dose of TPA was not administered. Patient was taken to interventional radiology for further management following a repeat CT scan of her head to rule out intracranial hemorrhage after worsening of right hemiparesis and development of lethargy. Repeat study showed no signs of acute intracranial hemorrhage. Study did show early signs of possible MCA territory stroke involving left temporal and frontoparietal regions as well as left basal ganglia area.  LSN: 6:30 AM on 04/11/2015 tPA Given: Yes mRankin:   SUBJECTIVE (INTERVAL HISTORY) Patient tolerated trach collar overnight.  Seems to be more responsive to family today.  Does not follow commands but will mimic at times  OBJECTIVE Temp:  [98.1 F (36.7 C)-99.4 F (37.4 C)] 99.4 F (37.4 C) (10/30 0725) Pulse Rate:  [55-95] 86 (10/30 0900) Cardiac Rhythm:  [-] Normal sinus rhythm (10/30 0800) Resp:  [17-35] 31 (10/30 0900) BP: (130-183)/(52-87) 151/62 mmHg (10/30 0900) SpO2:  [94 %-100 %] 96 % (10/30 0900) FiO2 (%):  [28 %-40 %] 28 % (10/30 0900) Weight:  [95.8 kg (211 lb 3.2 oz)] 95.8 kg (211 lb 3.2 oz) (10/30 0500)  CBC:   Recent Labs Lab 04/21/15 0357 04/22/15 0423  WBC 10.5 10.1  NEUTROABS 7.6 7.3  HGB 9.8* 9.8*  HCT 31.4* 32.4*  MCV 93.5 93.1  PLT 165 250    Basic Metabolic Panel:   Recent  Labs Lab 04/19/15 0325  04/20/15 0347  04/21/15 0357  04/22/15 0423 04/22/15 0936  NA 155*  < > 154*  < > 153*  < > 148* 149*  K 3.5  --  3.0*  --  3.2*  --  3.2*  --   CL 122*  --  120*  --  119*  --  115*  --   CO2 26  --  26  --  26  --  26  --   GLUCOSE 159*  --  233*  --  174*  --  224*  --   BUN 21*  --  20  --  17  --  14  --   CREATININE 0.52  --  0.64  --  0.49  --  0.53  --   CALCIUM 8.3*  --  8.2*  --  8.5*  --  8.0*  --   MG 2.1  --  2.1  --   --   --   --   --   PHOS 3.5  --  2.7  --  2.8  --   --   --   < > = values in this interval not displayed.  Lipid Panel:     Component Value Date/Time   CHOL 208* 04/12/2015 0315   TRIG 231* 04/12/2015 0315   HDL 29* 04/12/2015 0315   CHOLHDL 7.2 04/12/2015 0315   VLDL 46* 04/12/2015 0315   LDLCALC 133* 04/12/2015 0315   HgbA1c:  Lab Results  Component Value Date   HGBA1C 11.4* 04/12/2015   Urine Drug Screen: No results found for: LABOPIA, COCAINSCRNUR, LABBENZ, AMPHETMU, THCU, LABBARB    IMAGING I have personally reviewed the radiological images below and agree with the radiology interpretations.  Ct Head Wo Contrast  04/17/2015  1. Mild interval evolution of large left MCA territory infarct, with associated edema. Slight rightward midline shift, measuring 2-3 mm, with partial effacement of the left lateral ventricle. Scattered foci of associated hemorrhage are perhaps slightly more diffuse than on the prior study. 2. Mild small vessel ischemic microangiopathy.  04/12/2105  1. Large acute left MCA territory infarct with resolving associated hemorrhage. Similar local mass effect without midline shift. 2. No new intracranial process.  04/12/2015  Acute large LEFT MCA territory infarct with similar patchy areas of hemorrhage. Local mass effect without midline shift. Mildly dense intracranial vessels most compatible with residual contrast.   04/11/2015  Acute hemorrhage in the left temporal lobe. This may be within the  sylvian fissure rather than the insular cortex. There are small gas bubbles in the area of hemorrhage which may be extra vascular gas in the subarachnoid space. This is likely related to the procedure and micro perforation of a vessel. Acute infarct left temporal lobe and possibly left basal ganglia. There is local edema without midline shift   04/11/2015  Slightly more conspicuous loss of gray-white differentiation affecting the left temporal lobe and frontoparietal regions in the left basal ganglia consistent with acute left MCA infarction. Aspects score difficult because of some motion, but estimated at 5 or 6.   04/11/2015  CT findings consistent with early left MCA infarct with a dense left MCA sign. No mass effect or hemorrhage.   CT chest without contrast 04/20/2015 1. Consolidation at the RIGHT lung base suggest pneumonia.   Recommend follow-up contrast chest CT to exclude underlying mass lesion. 2. Small bilateral pleural effusions. 3. No pulmonary edema  2D Echocardiogram   - Left ventricle: The cavity size was normal. Systolic function wasnormal. The estimated ejection fraction was in the range of 55%to 60%. Wall motion was normal; there were no regional wallmotion abnormalities. - Aortic valve: Valve area (VTI): 2.24 cm^2. Valve area (Vmax): 2cm^2. Valve area (Vmean): 2.04 cm^2. - Atrial septum: No defect or patent foramen ovale was identified.    Physical Exam  General: The patient is intubated, drowsy, open eyes on pain stimulation, tracking but not following simple commands. Off fentanyl. HEENT:  NCAT; Sclera clear; intubated  Respiratory: Clear lung fields  Cardiovascular: Regular rate and rhythm  Abdomen: Soft, nontender, positive bowel sounds  Skin: 1+ edema in the legs noted..  Neurologic Exam Had trach on PS, drowsy, open eyes on pain stimulation, tracking with eyes but not following simple commands. Both eyes conjugated and tracking objects on the left, pupil  left 3.57mm and right 53mm, both sluggish to light, difference remained same at light or dark. Doll's eyes present. Right facial droop. Positive corneal and gag. Left UE localized to pain, right UE 0/5 on pain. Left LE against gravity, RLE 2/5 on pain stimulation. Reflex 1+ and right babinski positive.   ASSESSMENT/PLAN Kerry [name] is a 59 y.o. female with history of diabetes mellitus, hypertension, hyperlipidemia and fibromyalgia presenting with new onset speech difficulty and right hemiparesis. She received IV t-PA (total 50 mg, 19 mg not received) due to elevated BP. 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   Resultant  Respiratory failure, right hemiparesis, cerebral edema  Repeat CT head large L MCA infarct, mild midline shift  2D Echo  EF 55-60%, no source of embolus  LDL 133  HgbA1c 11.4    VTE prophylaxis - subcutaneous heparin Diet NPO time specified  No antithrombotic prior to admission.now on Plavix 75 mg daily  Ongoing aggressive stroke risk factor management  PEG and trach done on 04/18/15  Therapy recommendations:  pending   Disposition:  pending   Cytotoxic cerebral edema  Repeat CT showed mild midline shift  Started 3% saline 04/13/2015, off 04/16/15  Na 159 -> 157 -> 155 ->159 -> 154 -> 149  change free water to NS  Continue Na monitoring  Carotid stenosis  L ICA occlusion  S/p acute stent placement (Dr. Arizona Constable at [facility])  Switched from ASA to plavix for carotid stenting after procedure  Acute respiratory Failure  Intubated for procedure  Unable to protect airway  S/p trach, now on CPAP with PS  Malignant Hypertension  BP 179/82 on  arrival, as high as 213/97  Treated with cardene drip, off   BP on the high side  Increase lisinopril to 20mg  bid  Hyperlipidemia  Home meds:  pravachol 40  LDL 133, goal < 70  Resumed home meds  Continue statin at discharge  Bradycardia - likely due to carotid stent placement  Down to the 30s both pre-IR and post CL placement  BB not recommended  Cardiology has seen, external pacemaker placed and then removed 04/16/15  HR stable  Diabetes type II  HgbA1c 11.4 , goal < 7.0  On lantus 30 u daily PTA, on tube feeding, resume lantus 30 unit.  SSI  CBG monitoring  Other Stroke Risk Factors  Cigarette smoker, quit smoking  ETOH use  Obesity, Body mass index is 34.1 kg/(m^2).   Coronary artery disease  Noncompliance   L femoral bypass  Other Active Problems  Hx breast cancer  AKI - Cr 0.5  Hospital day # 11     Attending System Review and Plans for 04/22/2015  Neuro  Dominant left MCA infarct due to embolization from proximal left internal carotid artery occlusion s/p IV tPA, proximal left internal carotid stent, mechanical embolectomy with resultant TICI 2A flow with multiple persistent distal emboli.   Cytotoxic cerebral edema - hypertonic saline 04/22/2015 Na - 149  Cardiac Hypertension history - Permissive hypertension <220/120 for 24-48 hours and then gradually normalize within 5-7 days  Amlodipine 10 mg daily  Hydralazine prn  Lisinopril 20 mg daily  Added Hydralazine 25mg  TID on 04/21/2015; will increase to 50mg  and monitor  Pulmonary  Chest CT 04/20/2015 - Consolidation at the RIGHT lung base suggest pneumonia. Recommend follow-up contrast chest CT to  exclude underlying mass lesion. Small bilateral pleural effusions. No pulmonary edema  CXR 04/22/2015 - CHF and bibasilar atelectasis. I/O 2300 / 0 = 2300  Lasix 20mg  IV now  Decrease IVFs; consider heplock  GI  PEG  Tube feeds - Vital AF 14ml / hour  GU   I/O 2300 / 0  = 2300  Endo  Diabetes mellitus - hemoglobin A1c 11.4  Sliding scale insulin  Checking TSH 04/22/2015 - 0.423 low normal  Heme / ID  Rocephin day #2  Ax Temp - 99.4  04/22/2015  WBCs 10.1  Anemia H/H 9.8 / 31.4  Vancomycin discontinued  Electrolytes  Sodium 149  Potassium 3.2 - KCL - ordered   Lowry Ram [facility] Pager [phone] 04/22/2015, 10:45 AM   CRITICAL CARE NEUROLOGY ATTENDING NOTE Patient was seen and examined by me personally. I reviewed notes, independently viewed imaging studies, participated in medical decision making and plan of care. I have made additions or clarifications directly to the above note.  Documentation accurately reflects findings. The laboratory and radiographic studies were personally reviewed by me.  ROS could not be fully documented due to LOC  Assessment and plan completed by me personally and fully documented above.  Condition is unchanged    This patient is critically ill and at significant risk of neurological worsening, death and care requires constant monitoring of vital signs, hemodynamics,respiratory and cardiac monitoring, extensive review of multiple databases, frequent neurological assessment, discussion with family, other specialists and medical decision making of high complexity.  This critical care time does not reflect procedure time, or teaching time or supervisory time of PA/NP/Med Resident etc. but could involve care discussion time.  I spent 30 minutes of Neurocritical Care time in the care of  this patient.  SIGNED BY: Dr. Elissa Hefty       To contact Stroke Continuity provider, please refer to [email or web address] After hours, contact General Neurology

## 2015-04-23 ENCOUNTER — Inpatient Hospital Stay (HOSPITAL_COMMUNITY): Payer: 59

## 2015-04-23 LAB — CBC WITH DIFFERENTIAL/PLATELET
BASOS PCT: 0 %
Basophils Absolute: 0 10*3/uL (ref 0.0–0.1)
EOS PCT: 2 %
Eosinophils Absolute: 0.2 10*3/uL (ref 0.0–0.7)
HEMATOCRIT: 31.2 % — AB (ref 36.0–46.0)
Hemoglobin: 9.6 g/dL — ABNORMAL LOW (ref 12.0–15.0)
Lymphocytes Relative: 27 %
Lymphs Abs: 2.4 10*3/uL (ref 0.7–4.0)
MCH: 28.3 pg (ref 26.0–34.0)
MCHC: 30.8 g/dL (ref 30.0–36.0)
MCV: 92 fL (ref 78.0–100.0)
MONO ABS: 0.4 10*3/uL (ref 0.1–1.0)
MONOS PCT: 5 %
Neutro Abs: 5.8 10*3/uL (ref 1.7–7.7)
Neutrophils Relative %: 66 %
Platelets: 194 10*3/uL (ref 150–400)
RBC: 3.39 MIL/uL — ABNORMAL LOW (ref 3.87–5.11)
RDW: 13.4 % (ref 11.5–15.5)
WBC: 8.8 10*3/uL (ref 4.0–10.5)

## 2015-04-23 LAB — BASIC METABOLIC PANEL
Anion gap: 8 (ref 5–15)
BUN: 11 mg/dL (ref 6–20)
CALCIUM: 7.9 mg/dL — AB (ref 8.9–10.3)
CO2: 25 mmol/L (ref 22–32)
CREATININE: 0.42 mg/dL — AB (ref 0.44–1.00)
Chloride: 115 mmol/L — ABNORMAL HIGH (ref 101–111)
GFR calc non Af Amer: >60 mL/min (ref >60)
GLUCOSE: 246 mg/dL — AB (ref 65–99)
Potassium: 3.2 mmol/L — ABNORMAL LOW (ref 3.5–5.1)
Sodium: 148 mmol/L — ABNORMAL HIGH (ref 135–145)

## 2015-04-23 LAB — SODIUM
SODIUM: 148 mmol/L — AB (ref 135–145)
Sodium: 147 mmol/L — ABNORMAL HIGH (ref 135–145)
Sodium: 143 mmol/L (ref 135–145)

## 2015-04-23 LAB — GLUCOSE, CAPILLARY
GLUCOSE-CAPILLARY: 202 mg/dL — AB (ref 65–99)
GLUCOSE-CAPILLARY: 209 mg/dL — AB (ref 65–99)
GLUCOSE-CAPILLARY: 217 mg/dL — AB (ref 65–99)
GLUCOSE-CAPILLARY: 228 mg/dL — AB (ref 65–99)
GLUCOSE-CAPILLARY: 232 mg/dL — AB (ref 65–99)
Glucose-Capillary: 222 mg/dL — ABNORMAL HIGH (ref 65–99)
Glucose-Capillary: 223 mg/dL — ABNORMAL HIGH (ref 65–99)

## 2015-04-23 MED ORDER — HYDRALAZINE HCL 20 MG/ML IJ SOLN
25.0000 mg | Freq: Once | INTRAMUSCULAR | Status: AC
  Administered 2015-04-23: 25 mg via INTRAVENOUS
  Filled 2015-04-23: qty 2

## 2015-04-23 MED ORDER — CLONIDINE ORAL SUSPENSION 10 MCG/ML
0.2000 mg | Freq: Two times a day (BID) | ORAL | Status: DC
  Administered 2015-04-23: 0.2 mg
  Filled 2015-04-23 (×4): qty 20

## 2015-04-23 MED ORDER — HYDRALAZINE HCL 20 MG/ML IJ SOLN: 25.0000 mg | INTRAMUSCULAR | Status: DC | PRN

## 2015-04-23 MED ORDER — CLONIDINE HCL 0.2 MG PO TABS
0.2000 mg | ORAL_TABLET | Freq: Two times a day (BID) | ORAL | Status: DC
  Administered 2015-04-23 – 2015-04-27 (×8): 0.2 mg
  Filled 2015-04-23 (×9): qty 1

## 2015-04-23 NOTE — Progress Notes (Signed)
PULMONARY / CRITICAL CARE MEDICINE   Name: Kerry [name] MRN: [id number] DOB: 07/30/1955    ADMISSION DATE:  04/11/2015 CONSULTATION DATE:  04/11/2015  REFERRING MD : Neurology  CHIEF COMPLAINT:  Rt sided weakness  INITIAL PRESENTATION:  59 y/o female smoker presented with Rt sided weakness and difficulty with speech from LT MCA CVA s/p limited dosing of tPA and embolectomy with stent.  Remained on vent post-procedure and PCCM consulted.  STUDIES:  10/19 CT head >> Lt MCA infarct 10/20 ECHO >> EF 55 to 60%  SIGNIFICANT EVENTS: 10/19 Admit, limited tPA, embolectomy with stenting 10/20 start 3% NS 10/21 Bradycardia from propofol, fever 10/26 trach, peg, & bronch w/ BAL 10/28 started Vanc for HCAP dc 'd for mssa 10/29 ancef>>  SUBJECTIVE:  Tolerating ATC Hypertensive on current BP regimen   VITAL SIGNS: Temp:  [98.3 F (36.8 C)-100 F (37.8 C)] 99 F (37.2 C) (10/31 0831) Pulse Rate:  [61-100] 91 (10/31 1015) Resp:  [19-36] 32 (10/31 1015) BP: (121-212)/(51-107) 205/61 mmHg (10/31 1015) SpO2:  [95 %-100 %] 97 % (10/31 1015) FiO2 (%):  [28 %] 28 % (10/31 0855) Weight:  [93.5 kg (206 lb 2.1 oz)] 93.5 kg (206 lb 2.1 oz) (10/31 0500) HEMODYNAMICS:   VENTILATOR SETTINGS: Vent Mode:  [-]  FiO2 (%):  [28 %] 28 % INTAKE / OUTPUT:  Intake/Output Summary (Last 24 hours) at 04/23/15 1027 Last data filed at 04/23/15 1000  Gross per 24 hour  Intake   2410 ml  Output      2 ml  Net   2408 ml    PHYSICAL EXAMINATION: General: Obese female. More awake. Eyes open.tracked. smiled Integument:  Warm & dry. No rash on exposed skin.  HEENT:  No scleral injection or icterus. Tracheostomy in place. PERRL. right IJ in place. Cardiovascular:  Regular rate. No edema. Unable to appreciate JVD. Pulmonary:  Coarse breath sounds bilaterally. No distress Abdomen: Soft. Normal bowel sounds. Protuberant. PEG tube in place. Neurological: Follows commands. Spontaneously moving left  lower extremity. Spontaneously moves left upper extremity.   LABS:  CBC  Recent Labs Lab 04/21/15 0357 04/22/15 0423 04/23/15 0350  WBC 10.5 10.1 8.8  HGB 9.8* 9.8* 9.6*  HCT 31.4* 32.4* 31.2*  PLT 165 194 194   Coag's No results for input(s): APTT, INR in the last 168 hours. BMET  Recent Labs Lab 04/21/15 0357  04/22/15 0423  04/22/15 1635 04/22/15 2215 04/23/15 0350  NA 153*  < > 148*  < > 147* 145 148*  K 3.2*  --  3.2*  --   --   --  3.2*  CL 119*  --  115*  --   --   --  115*  CO2 26  --  26  --   --   --  25  BUN 17  --  14  --   --   --  11  CREATININE 0.49  --  0.53  --   --   --  0.42*  GLUCOSE 174*  --  224*  --   --   --  246*  < > = values in this interval not displayed.   Electrolytes  Recent Labs Lab 04/17/15 0330  04/19/15 0325 04/20/15 0347 04/21/15 0357 04/22/15 0423 04/23/15 0350  CALCIUM 8.8*  < > 8.3* 8.2* 8.5* 8.0* 7.9*  MG 2.1  --  2.1 2.1  --   --   --   PHOS 3.3  --  3.5  2.7 2.8  --   --   < > = values in this interval not displayed. ABG No results for input(s): PHART, PCO2ART, PO2ART in the last 168 hours. Liver Enzymes  Recent Labs Lab 04/20/15 0347 04/21/15 0357 04/22/15 0423  AST  --   --  47*  ALT  --   --  48  ALKPHOS  --   --  151*  BILITOT  --   --  0.4  ALBUMIN 1.8* 1.8* 1.8*   Glucose  Recent Labs Lab 04/22/15 1146 04/22/15 1540 04/22/15 2006 04/22/15 2333 04/23/15 0343 04/23/15 0831  GLUCAP 226* 225* 187* 223* 222* 202*    Imaging Dg Chest Port 1 View  04/23/2015  CLINICAL DATA:  Respiratory failure. EXAM: PORTABLE CHEST 1 VIEW COMPARISON:  04/22/2015 and CT chest 04/20/2015. FINDINGS: Patient is rotated. Tracheostomy is grossly midline. Right PICC tip projects over the SVC. Heart size is stable. Slight improvement in aeration at the right lung base with mild diffuse hazy airspace opacification bilaterally. Suspect small bilateral effusions. IMPRESSION: Slight improvement in congestive heart failure.  Electronically Signed   By: Lorin Picket M.D.   On: 04/23/2015 07:47     ASSESSMENT / PLAN: NEUROLOGIC A:   Lt MCA infarct - s/p limited tPA and embolectomy with stenting Cytotoxic edema  P:   Plavix daily RASS goal: 0 Fentanyl IV prn for pain Versed IV prn Further management per Neurology   PULMONARY ETT 10/19 >>10/26 Percutaneous Trach 10/26>> A: Compromised airway in setting of CVA RLL HCAP - BAL final ctx pending. Staph aureus Tobacco abuse  P:   S/P Perc Trach 10/26 Albuterol neb q6hr for secretions Tolerating ATC 24x7, will remove vent from room Will follow for trach management abx as below  CARDIOVASCULAR Rt IJ CVL 10/20 >> A:  Malignant HTN - resolved.  Hx of CAD, HTN, PAD, HLD. Bradycardia - pacer removed. Previously evaluated by cardiology.  P:  Continue pravachol  Discussed BP with Dr [name] > continue lisinopril, amlodipine, hydralazine at current doses, add clonidine bid on 10/31 Plavix daily Aspirin stopped 10/27 Off Nicardipine gtt Labetalol & Hydralazine IV prn  RENAL A:   Hypernatremia - Medically induced. Improving. Hypokalemia - Replacing via tube  P:   KCl via peg tube bid Monitoring UOP with foley Monitoring renal function with daily BUN/Creatinine Na monitoring q6hr per Neurology  GASTROINTESTINAL A:   S/P PEG  P:   Continuing Tube feedings Protonix via tube  HEMATOLOGIC A:  Anemia - Trending down. No signs of active bleeding.  P:  Trending Hgb daily w/ CBC SCDs Heparin [place] q8hr  INFECTIOUS A:   HCAP - MSSA E coli UTI P: BAL (10/26)>> Staph aureus >> MSSA Urine Ctx (10/26) - E coli (pan-sens)  Vancomycin 10/28>> 10/29 Rocephin 1gm IV q24hr for UTI 10/28>> 10/29 Ancef 10/29 >> (day 4 of 8 on 10/31) Trending leukocyte count.   ENDOCRINE A:  H/O DM CBG (last 3)   Recent Labs  04/22/15 2333 04/23/15 0343 04/23/15 0831  GLUCAP 223* 222* 202*  P:   Lantus 30 units qhs Accuchecks q4hr SSI  coverage  FAMILY DISCUSSION:  Updated husband and mother at bedside 10/30    Baltazar Apo, MD, PhD 04/23/2015, 10:27 AM [facility] [phone] or if no answer [phone]

## 2015-04-23 NOTE — Progress Notes (Signed)
STROKE TEAM PROGRESS NOTE  HISTORY  Kerry [name] is an 59 y.o. female with a history of diabetes mellitus, hypertension and hyperlipidemia brought to the emergency room following acute onset of speech difficulty as well as right facial and extremity weakness. Patient has no previous history of stroke nor TIA. She has not been on antiplatelet therapy. CT scan of her head showed no signs of acute hemorrhage. Dense left MCA was noted, indicative of acute thrombus. Patient was deemed a candidate for TPA which was started. She had repeated problems with hypertension requiring intervention with IV labetalol as well as hydralazine. Complete dose of TPA was not administered. Patient was taken to interventional radiology for further management following a repeat CT scan of her head to rule out intracranial hemorrhage after worsening of right hemiparesis and development of lethargy. Repeat study showed no signs of acute intracranial hemorrhage. Study did show early signs of possible MCA territory stroke involving left temporal and frontoparietal regions as well as left basal ganglia area.  LSN: 6:30 AM on 04/11/2015 tPA Given: Yes mRankin:   SUBJECTIVE (INTERVAL HISTORY) Patient tolerated trach collar now allmost 48 hrst.  Seems to be more responsive to family today.  Does   follow occasional commands on left  and will mimic at times  OBJECTIVE Temp:  [98.3 F (36.8 C)-100 F (37.8 C)] 98.8 F (37.1 C) (10/31 1155) Pulse Rate:  [61-95] 72 (10/31 1430) Cardiac Rhythm:  [-] Normal sinus rhythm (10/31 0800) Resp:  [19-36] 35 (10/31 1430) BP: (121-216)/(48-107) 169/59 mmHg (10/31 1430) SpO2:  [95 %-100 %] 100 % (10/31 1430) FiO2 (%):  [28 %] 28 % (10/31 1200) Weight:  [206 lb 2.1 oz (93.5 kg)] 206 lb 2.1 oz (93.5 kg) (10/31 0500)  CBC:   Recent Labs Lab 04/22/15 0423 04/23/15 0350  WBC 10.1 8.8  NEUTROABS 7.3 5.8  HGB 9.8* 9.6*  HCT 32.4* 31.2*  MCV 93.1 92.0  PLT 194 194    Basic  Metabolic Panel:   Recent Labs Lab 04/19/15 0325  04/20/15 0347  04/21/15 0357  04/22/15 0423  04/23/15 0350 04/23/15 0950  NA 155*  < > 154*  < > 153*  < > 148*  < > 148* 147*  K 3.5  --  3.0*  --  3.2*  --  3.2*  --  3.2*  --   CL 122*  --  120*  --  119*  --  115*  --  115*  --   CO2 26  --  26  --  26  --  26  --  25  --   GLUCOSE 159*  --  233*  --  174*  --  224*  --  246*  --   BUN 21*  --  20  --  17  --  14  --  11  --   CREATININE 0.52  --  0.64  --  0.49  --  0.53  --  0.42*  --   CALCIUM 8.3*  --  8.2*  --  8.5*  --  8.0*  --  7.9*  --   MG 2.1  --  2.1  --   --   --   --   --   --   --   PHOS 3.5  --  2.7  --  2.8  --   --   --   --   --   < > = values in this interval not  displayed.  Lipid Panel:     Component Value Date/Time   CHOL 208* 04/12/2015 0315   TRIG 231* 04/12/2015 0315   HDL 29* 04/12/2015 0315   CHOLHDL 7.2 04/12/2015 0315   VLDL 46* 04/12/2015 0315   LDLCALC 133* 04/12/2015 0315   HgbA1c:  Lab Results  Component Value Date   HGBA1C 11.4* 04/12/2015   Urine Drug Screen: No results found for: LABOPIA, COCAINSCRNUR, LABBENZ, AMPHETMU, THCU, LABBARB    IMAGING I have personally reviewed the radiological images below and agree with the radiology interpretations.  Ct Head Wo Contrast  04/17/2015  1. Mild interval evolution of large left MCA territory infarct, with associated edema. Slight rightward midline shift, measuring 2-3 mm, with partial effacement of the left lateral ventricle. Scattered foci of associated hemorrhage are perhaps slightly more diffuse than on the prior study. 2. Mild small vessel ischemic microangiopathy.  04/12/2105  1. Large acute left MCA territory infarct with resolving associated hemorrhage. Similar local mass effect without midline shift. 2. No new intracranial process.  04/12/2015  Acute large LEFT MCA territory infarct with similar patchy areas of hemorrhage. Local mass effect without midline shift. Mildly dense  intracranial vessels most compatible with residual contrast.   04/11/2015  Acute hemorrhage in the left temporal lobe. This may be within the sylvian fissure rather than the insular cortex. There are small gas bubbles in the area of hemorrhage which may be extra vascular gas in the subarachnoid space. This is likely related to the procedure and micro perforation of a vessel. Acute infarct left temporal lobe and possibly left basal ganglia. There is local edema without midline shift   04/11/2015  Slightly more conspicuous loss of gray-white differentiation affecting the left temporal lobe and frontoparietal regions in the left basal ganglia consistent with acute left MCA infarction. Aspects score difficult because of some motion, but estimated at 5 or 6.   04/11/2015  CT findings consistent with early left MCA infarct with a dense left MCA sign. No mass effect or hemorrhage.   CT chest without contrast 04/20/2015 1. Consolidation at the RIGHT lung base suggest pneumonia.   Recommend follow-up contrast chest CT to exclude underlying mass lesion. 2. Small bilateral pleural effusions. 3. No pulmonary edema  2D Echocardiogram   - Left ventricle: The cavity size was normal. Systolic function wasnormal. The estimated ejection fraction was in the range of 55%to 60%. Wall motion was normal; there were no regional wallmotion abnormalities. - Aortic valve: Valve area (VTI): 2.24 cm^2. Valve area (Vmax): 2cm^2. Valve area (Vmean): 2.04 cm^2. - Atrial septum: No defect or patent foramen ovale was identified.    Physical Exam  General: The patient is intubated, drowsy, open eyes on pain stimulation, tracking but not following simple commands. Off fentanyl. HEENT:  NCAT; Sclera clear; intubated  Respiratory: Clear lung fields  Cardiovascular: Regular rate and rhythm  Abdomen: Soft, nontender, positive bowel sounds  Skin: 1+ edema in the legs noted..  Neurologic Exam Awake and interactive,  open eyes on pain stimulation, tracking with eyes but not following simple commands mainly on left side. Both eyes conjugated and tracking objects on the left, pupil left 3.2mm and right 56mm, both sluggish to light, difference remained same at light or dark. Doll's eyes present. Right facial droop. Positive corneal and gag. Left UE localized to pain, right UE 0/5 on pain. Left LE against gravity, RLE 2/5 on pain stimulation. Reflex 1+ and right babinski positive.   ASSESSMENT/PLAN Ms. TYAN LASURE is  a 59 y.o. female with history of diabetes mellitus, hypertension, hyperlipidemia and fibromyalgia presenting with new onset speech difficulty and right hemiparesis. She received IV t-PA (total 50 mg, 19 mg not received) due to elevated BP. 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   Resultant  Respiratory failure, right hemiparesis, cerebral edema  Repeat CT head large L MCA infarct, mild midline shift  2D Echo  EF 55-60%, no source of embolus  LDL 133  HgbA1c 11.4    VTE prophylaxis - subcutaneous heparin Diet NPO time specified  No antithrombotic prior to admission.now on Plavix 75 mg daily  Ongoing aggressive stroke risk factor management  PEG and trach done on 04/18/15  Therapy recommendations:  pending   Disposition:  pending   Cytotoxic cerebral edema  Repeat CT showed mild midline shift  Started 3% saline 04/13/2015, off 04/16/15  Na 159 -> 157 -> 155 ->159 -> 154 -> 149  change free water to NS  Continue Na monitoring  Carotid stenosis  L ICA occlusion  S/p acute stent placement (Dr. Arizona Constable at [facility])  Switched from ASA to plavix for carotid stenting after  procedure  Acute respiratory Failure  Intubated for procedure  Unable to protect airway  S/p trach, now on CPAP with PS  Malignant Hypertension  BP 179/82 on arrival, as high as 213/97  Treated with cardene drip, off   BP on the high side  Increase lisinopril to 20mg  bid  Hyperlipidemia  Home meds:  pravachol 40  LDL 133, goal < 70  Resumed home meds  Continue statin at discharge  Bradycardia - likely due to carotid stent placement  Down to the 30s both pre-IR and post CL placement  BB not recommended  Cardiology has seen, external pacemaker placed and then removed 04/16/15  HR stable  Diabetes type II  HgbA1c 11.4 , goal < 7.0  On lantus 30 u daily PTA, on tube feeding, resume lantus 30 unit.  SSI  CBG monitoring  Other Stroke Risk Factors  Cigarette smoker, quit smoking  ETOH use  Obesity, Body mass index is 33.29 kg/(m^2).   Coronary artery disease  Noncompliance   L femoral bypass  Other Active Problems  Hx breast cancer  AKI - Cr 0.5  Hospital day # 12   Patient was seen and examined by me personally. I reviewed notes, independently viewed imaging studies, participated in medical decision making and plan of care. I have made additions or clarifications directly to the above note.  Documentation accurately reflects findings. The laboratory and radiographic studies were personally reviewed by me. I had a long discussion with the patient's husband at the bedside and the family is interested in inpatient rehabilitation. Will consult and rehabilitation coordinator. The family consider transferring the patient the ICU over the next few days. Will add clonidine for better blood pressure control. Discussed with Dr. Lamonte Sakai per night critical care attending   This patient is critically ill and at significant risk of neurological worsening, death and care requires constant monitoring of vital signs, hemodynamics,respiratory and cardiac monitoring,  extensive review of multiple databases, frequent neurological assessment, discussion with family, other specialists and medical decision making of high complexity.  This critical care time does not reflect procedure  time, or teaching time or supervisory time of PA/NP/Med Resident etc. but could involve care discussion time.  I spent 30 minutes of Neurocritical Care time in the care of  this patient.  SIGNED BY:   Antony Contras, MD     04/23/2015, 2:43 PM         To contact Stroke Continuity provider, please refer to [email or web address] After hours, contact General Neurology

## 2015-04-23 NOTE — Progress Notes (Signed)
Physical Therapy Treatment Patient Details Name: Kerry [name] MRN: [id number] DOB: [birth year] Today's Date: 04/23/2015    History of Present Illness pt presents with dense L MCA Infarct involving L Temporal, Frontoparietal, and L Basal Ganglia regions and was started on TPA, but unable to complete full dose due to hypertension, but did recieve IR intervention.  pt with hx of DM, HTN, Breast CA s/p Mastectomy, and Fibromyalgia.      PT Comments    Pt continues to require 2 person A for mobility and maintaining balance in sitting.  Pt unable to bring gaze to L side past midline.  Husband very hopeful for CIR at D/C.  Will continue to follow.    Follow Up Recommendations  CIR     Equipment Recommendations  None recommended by PT    Recommendations for Other Services       Precautions / Restrictions Precautions Precautions: Fall;Other (comment) (Trach/Peg) Restrictions Weight Bearing Restrictions: No    Mobility  Bed Mobility Overal bed mobility: Needs Assistance;+2 for physical assistance Bed Mobility: Supine to Sit;Sit to Supine     Supine to sit: Total assist;+2 for physical assistance;HOB elevated Sit to supine: Total assist;+2 for physical assistance   General bed mobility comments: pt not participating in bed mobility.    Transfers                    Ambulation/Gait                 Stairs            Wheelchair Mobility    Modified Rankin (Stroke Patients Only)       Balance Overall balance assessment: Needs assistance Sitting-balance support: No upper extremity supported;Feet supported Sitting balance-Leahy Scale: Zero Sitting balance - Comments: pt Sat EOB ~72mins with pt needing total A to maintain balance.                              Cognition Arousal/Alertness: Awake/alert Behavior During Therapy: Flat affect Overall Cognitive Status: Impaired/Different from baseline Area of Impairment: Following commands        Following Commands: Follows one step commands inconsistently            Exercises      General Comments        Pertinent Vitals/Pain Pain Assessment: Faces Faces Pain Scale: No hurt    Home Living                      Prior Function            PT Goals (current goals can now be found in the care plan section) Acute Rehab PT Goals Patient Stated Goal: family  goal is for pt to go to rehab PT Goal Formulation: With patient/family Time For Goal Achievement: 05/03/15 Potential to Achieve Goals: Good Progress towards PT goals: Progressing toward goals    Frequency  Min 3X/week    PT Plan Current plan remains appropriate    Co-evaluation PT/OT/SLP Co-Evaluation/Treatment: Yes Reason for Co-Treatment: Complexity of the patient's impairments (multi-system involvement) PT goals addressed during session: Mobility/safety with mobility;Balance       End of Session Equipment Utilized During Treatment: Oxygen (Trach Collar) Activity Tolerance: Patient limited by fatigue Patient left: in bed;with call bell/phone within reach;with restraints reapplied;with family/visitor present     Time: 1001-1023 PT Time Calculation (min) (ACUTE ONLY): 22 min  Charges:  $  Therapeutic Activity: 8-22 mins                    G CodesCatarina Hartshorn, [place] 04/23/2015, 11:08 AM

## 2015-04-23 NOTE — Progress Notes (Signed)
Occupational Therapy Treatment Patient Details Name: Kerry [name] MRN: [id number] DOB: [birth year] Today's Date: 04/23/2015    History of present illness pt presents with dense L MCA Infarct involving L Temporal, Frontoparietal, and L Basal Ganglia regions and was started on TPA, but unable to complete full dose due to hypertension, but did recieve IR intervention. Pt extubated 10/30. pt with hx of DM, HTN, Breast CA s/p Mastectomy, and Fibromyalgia.     OT comments  Pt now off ventilator and on 28% trach collar. Remained alert while stimulated. Continues to require +2 total assist for all mobility. Not yet seeing purposeful movement on R. Pt turned head toward R to locate husband x 1 while in sitting, did not observe regard for R visual field while in supine.    Follow Up Recommendations  CIR;Supervision/Assistance - 24 hour    Equipment Recommendations  3 in 1 bedside comode;Tub/shower bench    Recommendations for Other Services      Precautions / Restrictions Precautions Precautions: Fall (trach, PEG) Restrictions Weight Bearing Restrictions: No       Mobility Bed Mobility Overal bed mobility: Needs Assistance;+2 for physical assistance Bed Mobility: Supine to Sit;Sit to Supine     Supine to sit: Total assist;+2 for physical assistance;HOB elevated Sit to supine: Total assist;+2 for physical assistance   General bed mobility comments: pt not participating in bed mobility.    Transfers                      Balance Overall balance assessment: Needs assistance Sitting-balance support: No upper extremity supported;Feet supported Sitting balance-Leahy Scale: Zero Sitting balance - Comments: pt Sat EOB ~48mins with pt needing total A to maintain balance.                             ADL Overall ADL's : Needs assistance/impaired     Grooming: Wash/dry face;Maximal assistance;Bed level                                 General ADL  Comments: attempts to reach for trach collar when out of restraints, educated pt's husband in importance of rest breaks following activity for brain healing      Vision   Alignment/Gaze Preference: Gaze left (looks toward R inconsistently with verbal cues)                 Perception     Praxis      Cognition   Behavior During Therapy: Flat affect Overall Cognitive Status: Impaired/Different from baseline Area of Impairment: Following commands        Following Commands: Follows one step commands inconsistently       General Comments: requiring hand over hand assist for face washing    Extremity/Trunk Assessment               Exercises Other Exercises Other Exercises: PROM all areas R UE, AAROM all areas L UE x 10. Hand over hand assist for self ROM of R UE in supine.   Shoulder Instructions       General Comments      Pertinent Vitals/ Pain       Pain Assessment: Faces Faces Pain Scale: No hurt  Home Living  Prior Functioning/Environment              Frequency Min 2X/week     Progress Toward Goals  OT Goals(current goals can now be found in the care plan section)  Progress towards OT goals: Progressing toward goals  Acute Rehab OT Goals Patient Stated Goal: family  goal is for pt to go to rehab Time For Goal Achievement: 05/03/15 Potential to Achieve Goals: Good  Plan Discharge plan remains appropriate    Co-evaluation    PT/OT/SLP Co-Evaluation/Treatment: Yes Reason for Co-Treatment: Complexity of the patient's impairments (multi-system involvement);Necessary to address cognition/behavior during functional activity PT goals addressed during session: Mobility/safety with mobility;Balance OT goals addressed during session: Strengthening/ROM;ADL's and self-care      End of Session Equipment Utilized During Treatment: Oxygen   Activity Tolerance Treatment limited secondary  to medical complications (Comment) (pt with elevated BP)   Patient Left in bed;with call bell/phone within reach;with family/visitor present;with SCD's reapplied;with restraints reapplied   Nurse Communication          Time: 3646-8032 OT Time Calculation (min): 34 min  Charges: OT General Charges $OT Visit: 1 Procedure OT Treatments $Therapeutic Activity: 8-22 mins  [name] 04/23/2015, 11:52 AM  [phone]

## 2015-04-23 NOTE — Progress Notes (Signed)
Patient was on a transpiration oxygen setup with venturi mask set at 28%. ATC setup was started for patient at 28% 5 LPM.

## 2015-04-23 NOTE — Progress Notes (Signed)
I met with pt's husband at bedside. He would like me to pursue an inpatient rehab stay. Noted pt 28 % trach collar. Discussed with PT and OT. Pt not yet ready for an inpt rehab admission. I will follow her progress over the next few days to then pursue insurance approval with [facility]. [phone]

## 2015-04-23 NOTE — Progress Notes (Signed)
Inpatient Diabetes Program Recommendations  AACE/ADA: New Consensus Statement on Inpatient Glycemic Control (2015)  Target Ranges:  Prepandial:   less than 140 mg/dL      Peak postprandial:   less than 180 mg/dL (1-2 hours)      Critically ill patients:  140 - 180 mg/dL   Review of Glycemic Control  Inpatient Diabetes Program Recommendations:  Insulin - Meal Coverage: consider adding Novolog 3 units Q4 as tube feed coverage (should not be given if TF held)  Thank you  Raoul Pitch BSN, RN,CDE Inpatient Diabetes Coordinator [phone] (team pager)

## 2015-04-24 LAB — GLUCOSE, CAPILLARY
GLUCOSE-CAPILLARY: 170 mg/dL — AB (ref 65–99)
GLUCOSE-CAPILLARY: 157 mg/dL — AB (ref 65–99)
GLUCOSE-CAPILLARY: 151 mg/dL — AB (ref 65–99)
Glucose-Capillary: 163 mg/dL — ABNORMAL HIGH (ref 65–99)
Glucose-Capillary: 183 mg/dL — ABNORMAL HIGH (ref 65–99)
Glucose-Capillary: 166 mg/dL — ABNORMAL HIGH (ref 65–99)

## 2015-04-24 LAB — BASIC METABOLIC PANEL
Anion gap: 6 (ref 5–15)
BUN: 10 mg/dL (ref 6–20)
CALCIUM: 8.1 mg/dL — AB (ref 8.9–10.3)
CO2: 27 mmol/L (ref 22–32)
CREATININE: 0.34 mg/dL — AB (ref 0.44–1.00)
Chloride: 112 mmol/L — ABNORMAL HIGH (ref 101–111)
GFR calc Af Amer: >60 mL/min (ref >60)
GLUCOSE: 209 mg/dL — AB (ref 65–99)
Potassium: 3.5 mmol/L (ref 3.5–5.1)
Sodium: 145 mmol/L (ref 135–145)

## 2015-04-24 LAB — SODIUM
Sodium: 145 mmol/L (ref 135–145)
Sodium: 143 mmol/L (ref 135–145)
Sodium: 141 mmol/L (ref 135–145)

## 2015-04-24 MED ORDER — INSULIN ASPART 100 UNIT/ML ~~LOC~~ SOLN
3.0000 [IU] | SUBCUTANEOUS | Status: DC
  Administered 2015-04-24 – 2015-04-25 (×6): 3 [IU] via SUBCUTANEOUS

## 2015-04-24 NOTE — Progress Notes (Signed)
STROKE TEAM PROGRESS NOTE  HISTORY  Kerry [name] is an 59 y.o. female with a history of diabetes mellitus, hypertension and hyperlipidemia brought to the emergency room following acute onset of speech difficulty as well as right facial and extremity weakness. Patient has no previous history of stroke nor TIA. She has not been on antiplatelet therapy. CT scan of her head showed no signs of acute hemorrhage. Dense left MCA was noted, indicative of acute thrombus. Patient was deemed a candidate for TPA which was started. She had repeated problems with hypertension requiring intervention with IV labetalol as well as hydralazine. Complete dose of TPA was not administered. Patient was taken to interventional radiology for further management following a repeat CT scan of her head to rule out intracranial hemorrhage after worsening of right hemiparesis and development of lethargy. Repeat study showed no signs of acute intracranial hemorrhage. Study did show early signs of possible MCA territory stroke involving left temporal and frontoparietal regions as well as left basal ganglia area.  LSN: 6:30 AM on 04/11/2015 tPA Given: Yes mRankin:   SUBJECTIVE (INTERVAL HISTORY) Patient tolerating trach collar but having copious thick secretions requiring frequent suctioning.    Does   follow occasional commands on left  and will mimic at times  OBJECTIVE Temp:  [97.8 F (36.6 C)-98.6 F (37 C)] 98.4 F (36.9 C) (11/01 1501) Pulse Rate:  [56-82] 82 (11/01 1104) Cardiac Rhythm:  [-] Normal sinus rhythm;Sinus bradycardia (11/01 0815) Resp:  [22-35] 35 (11/01 1104) BP: (111-172)/(48-65) 129/59 mmHg (11/01 0745) SpO2:  [96 %-99 %] 99 % (11/01 1104) FiO2 (%):  [28 %] 28 % (11/01 1500) Weight:  [208 lb 8.9 oz (94.6 kg)-211 lb 10.3 oz (96 kg)] 211 lb 10.3 oz (96 kg) (11/01 0500)  CBC:   Recent Labs Lab 04/22/15 0423 04/23/15 0350  WBC 10.1 8.8  NEUTROABS 7.3 5.8  HGB 9.8* 9.6*  HCT 32.4* 31.2*  MCV  93.1 92.0  PLT 194 256    Basic Metabolic Panel:   Recent Labs Lab 04/19/15 0325  04/20/15 0347  04/21/15 0357  04/23/15 0350  04/24/15 0355 04/24/15 1030  NA 155*  < > 154*  < > 153*  < > 148*  < > 145 145  K 3.5  --  3.0*  --  3.2*  < > 3.2*  --  3.5  --   CL 122*  --  120*  --  119*  < > 115*  --  112*  --   CO2 26  --  26  --  26  < > 25  --  27  --   GLUCOSE 159*  --  233*  --  174*  < > 246*  --  209*  --   BUN 21*  --  20  --  17  < > 11  --  10  --   CREATININE 0.52  --  0.64  --  0.49  < > 0.42*  --  0.34*  --   CALCIUM 8.3*  --  8.2*  --  8.5*  < > 7.9*  --  8.1*  --   MG 2.1  --  2.1  --   --   --   --   --   --   --   PHOS 3.5  --  2.7  --  2.8  --   --   --   --   --   < > = values in  this interval not displayed.  Lipid Panel:     Component Value Date/Time   CHOL 208* 04/12/2015 0315   TRIG 231* 04/12/2015 0315   HDL 29* 04/12/2015 0315   CHOLHDL 7.2 04/12/2015 0315   VLDL 46* 04/12/2015 0315   LDLCALC 133* 04/12/2015 0315   HgbA1c:  Lab Results  Component Value Date   HGBA1C 11.4* 04/12/2015   Urine Drug Screen: No results found for: LABOPIA, COCAINSCRNUR, LABBENZ, AMPHETMU, THCU, LABBARB    IMAGING I have personally reviewed the radiological images below and agree with the radiology interpretations.  Ct Head Wo Contrast  04/17/2015  1. Mild interval evolution of large left MCA territory infarct, with associated edema. Slight rightward midline shift, measuring 2-3 mm, with partial effacement of the left lateral ventricle. Scattered foci of associated hemorrhage are perhaps slightly more diffuse than on the prior study. 2. Mild small vessel ischemic microangiopathy.  04/12/2105  1. Large acute left MCA territory infarct with resolving associated hemorrhage. Similar local mass effect without midline shift. 2. No new intracranial process.  04/12/2015  Acute large LEFT MCA territory infarct with similar patchy areas of hemorrhage. Local mass effect  without midline shift. Mildly dense intracranial vessels most compatible with residual contrast.   04/11/2015  Acute hemorrhage in the left temporal lobe. This may be within the sylvian fissure rather than the insular cortex. There are small gas bubbles in the area of hemorrhage which may be extra vascular gas in the subarachnoid space. This is likely related to the procedure and micro perforation of a vessel. Acute infarct left temporal lobe and possibly left basal ganglia. There is local edema without midline shift   04/11/2015  Slightly more conspicuous loss of gray-white differentiation affecting the left temporal lobe and frontoparietal regions in the left basal ganglia consistent with acute left MCA infarction. Aspects score difficult because of some motion, but estimated at 5 or 6.   04/11/2015  CT findings consistent with early left MCA infarct with a dense left MCA sign. No mass effect or hemorrhage.   CT chest without contrast 04/20/2015 1. Consolidation at the RIGHT lung base suggest pneumonia.   Recommend follow-up contrast chest CT to exclude underlying mass lesion. 2. Small bilateral pleural effusions. 3. No pulmonary edema  2D Echocardiogram   - Left ventricle: The cavity size was normal. Systolic function wasnormal. The estimated ejection fraction was in the range of 55%to 60%. Wall motion was normal; there were no regional wallmotion abnormalities. - Aortic valve: Valve area (VTI): 2.24 cm^2. Valve area (Vmax): 2cm^2. Valve area (Vmean): 2.04 cm^2. - Atrial septum: No defect or patent foramen ovale was identified.    Physical Exam  General: The patient is intubated, drowsy, open eyes on pain stimulation, tracking but not following simple commands. Off fentanyl. HEENT:  NCAT; Sclera clear; intubated  Respiratory: Clear lung fields  Cardiovascular: Regular rate and rhythm  Abdomen: Soft, nontender, positive bowel sounds  Skin: 1+ edema in the legs  noted..  Neurologic Exam Awake and interactive,  Eyes open. tracking with eyes but not following simple commands mainly on left side. Both eyes conjugated and tracking objects on the left, pupil left 3.63mm and right 41mm, both sluggish to light, difference remained same at light or dark. Doll's eyes present. Right facial droop. Positive corneal and gag. Left UE localized to pain, right UE 0/5 on pain. Left LE against gravity, RLE 2/5 on pain stimulation. Reflex 1+ and right babinski positive.   ASSESSMENT/PLAN Kerry [name]  is a 59 y.o. female with history of diabetes mellitus, hypertension, hyperlipidemia and fibromyalgia presenting with new onset speech difficulty and right hemiparesis. She received IV t-PA (total 50 mg, 19 mg not received) due to elevated BP. 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   Resultant  Respiratory failure, right hemiparesis, cerebral edema  Repeat CT head large L MCA infarct, mild midline shift  2D Echo  EF 55-60%, no source of embolus  LDL 133  HgbA1c 11.4    VTE prophylaxis - subcutaneous heparin Diet NPO time specified  No antithrombotic prior to admission.now on Plavix 75 mg daily  Ongoing aggressive stroke risk factor management  PEG and trach done on 04/18/15  Therapy recommendations:  pending   Disposition:  pending   Cytotoxic cerebral edema  Repeat CT showed mild midline shift  Started 3% saline 04/13/2015, off 04/16/15  Na 159 -> 157 -> 155 ->159 -> 154 -> 149  Carotid stenosis  L ICA occlusion  S/p acute stent placement (Dr. Arizona Constable at [facility])  Switched from ASA to plavix for carotid stenting after procedure  Acute respiratory  Failure  Intubated for procedure  Unable to protect airway  S/p trach, now on CPAP with PS  Malignant Hypertension  BP 179/82 on arrival, as high as 213/97  Treated with cardene drip, off   BP on the high side  Increase lisinopril to 20mg  bid  Hyperlipidemia  Home meds:  pravachol 40  LDL 133, goal < 70  Resumed home meds  Continue statin at discharge  Bradycardia - likely due to carotid stent placement  Down to the 30s both pre-IR and post CL placement  BB not recommended  Cardiology has seen, external pacemaker placed and then removed 04/16/15  HR stable  Diabetes type II  HgbA1c 11.4 , goal < 7.0  On lantus 30 u daily PTA, on tube feeding, resume lantus 30 unit.  SSI  CBG monitoring  Other Stroke Risk Factors  Cigarette smoker, quit smoking  ETOH use  Obesity, Body mass index is 34.18 kg/(m^2).   Coronary artery disease  Noncompliance   L femoral bypass  Other Active Problems  Hx breast cancer  AKI - Cr 0.5  Hospital day # 13   Patient was seen and examined by me personally. I reviewed notes, independently viewed imaging studies, participated in medical decision making and plan of care. I have made additions or clarifications directly to the above note.  Documentation accurately reflects findings. The laboratory and radiographic studies were personally reviewed by me. I had a long discussion with the patient's husband at the bedside and the family is interested in inpatient rehabilitation. Will consult and rehabilitation coordinator. The family consider transferring the patient the ICU over the next few days. BP better after  adding clonidine.  Plan to transfer to inpatient rehabilitation over the next few days . Discussed with husband, son and multiple family members and answered questions   SIGNED BY:   Antony Contras, MD     04/24/2015, 3:15 PM         To contact Stroke Continuity provider, please refer to [email or web address] After  hours, contact General Neurology

## 2015-04-24 NOTE — Progress Notes (Signed)
I met with pt and her Mom at bedside and then discussed pt's progress with her nurse. Pt receiving suctioning every hour today with lots of secretions. 28 % trach collar. I have begun insurance precertification for a possible inpt rehab admission once respiratory improved with secretions and pending insurance authorization. I discussed with Burnetta Sabin, GNP, by phone. I will follow up tomorrow. [phone]

## 2015-04-25 ENCOUNTER — Inpatient Hospital Stay (HOSPITAL_COMMUNITY): Payer: 59

## 2015-04-25 DIAGNOSIS — J9690 Respiratory failure, unspecified, unspecified whether with hypoxia or hypercapnia: Secondary | ICD-10-CM | POA: Insufficient documentation

## 2015-04-25 DIAGNOSIS — J9610 Chronic respiratory failure, unspecified whether with hypoxia or hypercapnia: Secondary | ICD-10-CM

## 2015-04-25 LAB — SODIUM: SODIUM: 139 mmol/L (ref 135–145)

## 2015-04-25 LAB — GLUCOSE, CAPILLARY
GLUCOSE-CAPILLARY: 122 mg/dL — AB (ref 65–99)
Glucose-Capillary: 149 mg/dL — ABNORMAL HIGH (ref 65–99)
Glucose-Capillary: 98 mg/dL (ref 65–99)
Glucose-Capillary: 128 mg/dL — ABNORMAL HIGH (ref 65–99)
Glucose-Capillary: 136 mg/dL — ABNORMAL HIGH (ref 65–99)

## 2015-04-25 MED ORDER — INSULIN ASPART 100 UNIT/ML ~~LOC~~ SOLN
3.0000 [IU] | SUBCUTANEOUS | Status: DC
  Administered 2015-04-25 – 2015-04-27 (×15): 3 [IU] via SUBCUTANEOUS

## 2015-04-25 MED ORDER — CHLORHEXIDINE GLUCONATE 0.12 % MT SOLN
15.0000 mL | Freq: Two times a day (BID) | OROMUCOSAL | Status: DC
  Administered 2015-04-25 – 2015-04-27 (×4): 15 mL via OROMUCOSAL
  Filled 2015-04-25 (×5): qty 15

## 2015-04-25 MED ORDER — CETYLPYRIDINIUM CHLORIDE 0.05 % MT LIQD
7.0000 mL | Freq: Two times a day (BID) | OROMUCOSAL | Status: DC
  Administered 2015-04-26 – 2015-04-27 (×4): 7 mL via OROMUCOSAL

## 2015-04-25 NOTE — Progress Notes (Signed)
RT note-RN notified concerning suture removal for trach. No call back from MD, will readdress in AM.

## 2015-04-25 NOTE — Progress Notes (Signed)
Nutrition Follow-up  DOCUMENTATION CODES:   Obesity unspecified  INTERVENTION:    Continue Vital AF 1.2  via PEG at 50 ml/h (1200 ml/day) to provide 1440 kcals, 90 gm protein, 973 ml free water daily.   NUTRITION DIAGNOSIS:   Inadequate oral intake related to inability to eat as evidenced by NPO status.  Ongoing  GOAL:   Patient will meet greater than or equal to 90% of their needs  Met  MONITOR:   I & O's, Vent status, Labs, TF tolerance, Weight trends   ASSESSMENT:   Pt admitted on 10/19 w/ acute left MCA territory CVA. Admitted to the ICU on full ventilator support s/p partial dose systemic TPA; and arterial embolectomy w/ ICA stent placement. Post intervention CT chest noted Possible left temporal ICH.   S/P PEG 10/26. Patient is currently receiving Vital AF 1.2 via PEG at 50 ml/h (1200 ml/day) to provide 1440 kcals, 90 gm protein, 973 ml free water daily. Tolerating well per RN. Meeting 100% of estimated nutrition needs. Remains NPO. Plans for transfer to Rehab unit soon.  Diet Order:  Diet NPO time specified  Skin:  Reviewed, no issues  Last BM:  10/31  Height:   Ht Readings from Last 1 Encounters:  04/23/15 5' 6"  (1.676 m)    Weight:   Wt Readings from Last 1 Encounters:  04/25/15 214 lb 8.1 oz (97.3 kg)    Ideal Body Weight:  59 kg  BMI:  Body mass index is 34.64 kg/(m^2).  Estimated Nutritional Needs:   Kcal:  1400-1600  Protein:  85-95 gm  Fluid:  >/= 1.5 L  EDUCATION NEEDS:   No education needs identified at this time   Molli Barrows, [place], [place], [place] Pager [phone] After Hours Pager [phone]

## 2015-04-25 NOTE — Progress Notes (Signed)
I met with bedside RN and then spoke with Dr. [name] concerning pt's frequent suctioning needs. I will not plan on admitting pt today. I will follow up tomorrow. [phone]

## 2015-04-25 NOTE — Progress Notes (Signed)
Dr. [name] paged to ask if he wants the sodium lab from this morning.  Lab has specimen and result, but cannot post it as the order has been D/Cd.

## 2015-04-25 NOTE — Progress Notes (Signed)
Current CBG 98.  Dr. [name] paged to inquire about the 3 units of insulin due at this time.

## 2015-04-25 NOTE — Progress Notes (Signed)
STROKE TEAM PROGRESS NOTE  HISTORY  Kerry [name] is an 59 y.o. female with a history of diabetes mellitus, hypertension and hyperlipidemia brought to the emergency room following acute onset of speech difficulty as well as right facial and extremity weakness. Patient has no previous history of stroke nor TIA. She has not been on antiplatelet therapy. CT scan of her head showed no signs of acute hemorrhage. Dense left MCA was noted, indicative of acute thrombus. Patient was deemed a candidate for TPA which was started. She had repeated problems with hypertension requiring intervention with IV labetalol as well as hydralazine. Complete dose of TPA was not administered. Patient was taken to interventional radiology for further management following a repeat CT scan of her head to rule out intracranial hemorrhage after worsening of right hemiparesis and development of lethargy. Repeat study showed no signs of acute intracranial hemorrhage. Study did show early signs of possible MCA territory stroke involving left temporal and frontoparietal regions as well as left basal ganglia area.  LSN: 6:30 AM on 04/11/2015 tPA Given: Yes mRankin:   SUBJECTIVE (INTERVAL HISTORY) Patient tolerating trach collar but having copious thick secretions requiring frequent suctioning.    Does   follow occasional commands on left  and will mimic at times  OBJECTIVE Temp:  [98.1 F (36.7 C)-99.1 F (37.3 C)] 99.1 F (37.3 C) (11/02 1446) Pulse Rate:  [57-70] 63 (11/02 1610) Cardiac Rhythm:  [-] Normal sinus rhythm (11/02 1610) Resp:  [24-35] 30 (11/02 1610) BP: (103-160)/(52-82) 134/82 mmHg (11/02 1610) SpO2:  [94 %-99 %] 97 % (11/02 1610) FiO2 (%):  [28 %] 28 % (11/02 1610) Weight:  [214 lb 8.1 oz (97.3 kg)] 214 lb 8.1 oz (97.3 kg) (11/02 0305)  CBC:   Recent Labs Lab 04/22/15 0423 04/23/15 0350  WBC 10.1 8.8  NEUTROABS 7.3 5.8  HGB 9.8* 9.6*  HCT 32.4* 31.2*  MCV 93.1 92.0  PLT 194 194    Basic  Metabolic Panel:   Recent Labs Lab 04/19/15 0325  04/20/15 0347  04/21/15 0357  04/23/15 0350  04/24/15 0355  04/24/15 2150 04/25/15 0307  NA 155*  < > 154*  < > 153*  < > 148*  < > 145  < > 141 139  K 3.5  --  3.0*  --  3.2*  < > 3.2*  --  3.5  --   --   --   CL 122*  --  120*  --  119*  < > 115*  --  112*  --   --   --   CO2 26  --  26  --  26  < > 25  --  27  --   --   --   GLUCOSE 159*  --  233*  --  174*  < > 246*  --  209*  --   --   --   BUN 21*  --  20  --  17  < > 11  --  10  --   --   --   CREATININE 0.52  --  0.64  --  0.49  < > 0.42*  --  0.34*  --   --   --   CALCIUM 8.3*  --  8.2*  --  8.5*  < > 7.9*  --  8.1*  --   --   --   MG 2.1  --  2.1  --   --   --   --   --   --   --   --   --  PHOS 3.5  --  2.7  --  2.8  --   --   --   --   --   --   --   < > = values in this interval not displayed.  Lipid Panel:     Component Value Date/Time   CHOL 208* 04/12/2015 0315   TRIG 231* 04/12/2015 0315   HDL 29* 04/12/2015 0315   CHOLHDL 7.2 04/12/2015 0315   VLDL 46* 04/12/2015 0315   LDLCALC 133* 04/12/2015 0315   HgbA1c:  Lab Results  Component Value Date   HGBA1C 11.4* 04/12/2015   Urine Drug Screen: No results found for: LABOPIA, COCAINSCRNUR, LABBENZ, AMPHETMU, THCU, LABBARB    IMAGING I have personally reviewed the radiological images below and agree with the radiology interpretations.  Ct Head Wo Contrast  04/17/2015  1. Mild interval evolution of large left MCA territory infarct, with associated edema. Slight rightward midline shift, measuring 2-3 mm, with partial effacement of the left lateral ventricle. Scattered foci of associated hemorrhage are perhaps slightly more diffuse than on the prior study. 2. Mild small vessel ischemic microangiopathy.  04/12/2105  1. Large acute left MCA territory infarct with resolving associated hemorrhage. Similar local mass effect without midline shift. 2. No new intracranial process.  04/12/2015  Acute large LEFT MCA  territory infarct with similar patchy areas of hemorrhage. Local mass effect without midline shift. Mildly dense intracranial vessels most compatible with residual contrast.   04/11/2015  Acute hemorrhage in the left temporal lobe. This may be within the sylvian fissure rather than the insular cortex. There are small gas bubbles in the area of hemorrhage which may be extra vascular gas in the subarachnoid space. This is likely related to the procedure and micro perforation of a vessel. Acute infarct left temporal lobe and possibly left basal ganglia. There is local edema without midline shift   04/11/2015  Slightly more conspicuous loss of gray-white differentiation affecting the left temporal lobe and frontoparietal regions in the left basal ganglia consistent with acute left MCA infarction. Aspects score difficult because of some motion, but estimated at 5 or 6.   04/11/2015  CT findings consistent with early left MCA infarct with a dense left MCA sign. No mass effect or hemorrhage.   CT chest without contrast 04/20/2015 1. Consolidation at the RIGHT lung base suggest pneumonia.   Recommend follow-up contrast chest CT to exclude underlying mass lesion. 2. Small bilateral pleural effusions. 3. No pulmonary edema  2D Echocardiogram   - Left ventricle: The cavity size was normal. Systolic function wasnormal. The estimated ejection fraction was in the range of 55%to 60%. Wall motion was normal; there were no regional wallmotion abnormalities. - Aortic valve: Valve area (VTI): 2.24 cm^2. Valve area (Vmax): 2cm^2. Valve area (Vmean): 2.04 cm^2. - Atrial septum: No defect or patent foramen ovale was identified.    Physical Exam  General: The patient is s/p trach, drowsy, open eyes on pain stimulation, tracking but not following simple commands. Off fentanyl. HEENT:  NCAT; Sclera clear; intubated  Respiratory: Clear lung fields  Cardiovascular: Regular rate and rhythm  Abdomen: Soft,  nontender, positive bowel sounds  Skin: 1+ edema in the legs noted..  Neurologic Exam Drowsy but awakens,  Eyes open. tracking with eyes but not following simple commands mainly on left side. Both eyes conjugated and tracking objects on the left, pupil left 3.45mm and right 60mm, both sluggish to light, difference remained same at light or dark. Doll's eyes present. Right  facial droop. Positive corneal and gag. Left UE localized to pain, right UE 0/5 on pain. Left LE against gravity, RLE 2/5 on pain stimulation. Reflex 1+ and right babinski positive.   ASSESSMENT/PLAN Ms. TERESHA HANKS is a 59 y.o. female with history of diabetes mellitus, hypertension, hyperlipidemia and fibromyalgia presenting with new onset speech difficulty and right hemiparesis. She received IV t-PA (total 50 mg, 19 mg not received) due to elevated BP. 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   Resultant  Respiratory failure, right hemiparesis, cerebral edema  Repeat CT head large L MCA infarct, mild midline shift  2D Echo  EF 55-60%, no source of embolus  LDL 133  HgbA1c 11.4    VTE prophylaxis - subcutaneous heparin Diet NPO time specified  No antithrombotic prior to admission.now on Plavix 75 mg daily  Ongoing aggressive stroke risk factor management  PEG and trach done on 04/18/15  Therapy recommendations:  pending   Disposition:  pending   Cytotoxic cerebral edema  Repeat CT showed mild midline shift  Started 3% saline 04/13/2015, off 04/16/15  Na 159 -> 157 -> 155 ->159 -> 154 -> 149  Carotid stenosis  L ICA occlusion  S/p acute stent placement (Dr. Arizona Constable at [facility])  Switched from ASA to  plavix for carotid stenting after procedure  Acute respiratory Failure  Intubated for procedure  Unable to protect airway  S/p trach, now on CPAP with PS  Malignant Hypertension  BP 179/82 on arrival, as high as 213/97  Treated with cardene drip, off   BP on the high side  Increase lisinopril to 20mg  bid  Hyperlipidemia  Home meds:  pravachol 40  LDL 133, goal < 70  Resumed home meds  Continue statin at discharge  Bradycardia - likely due to carotid stent placement  Down to the 30s both pre-IR and post CL placement  BB not recommended  Cardiology has seen, external pacemaker placed and then removed 04/16/15  HR stable  Diabetes type II  HgbA1c 11.4 , goal < 7.0  On lantus 30 u daily PTA, on tube feeding, resume lantus 30 unit.  SSI  CBG monitoring  Other Stroke Risk Factors  Cigarette smoker, quit smoking  ETOH use  Obesity, Body mass index is 34.64 kg/(m^2).   Coronary artery disease  Noncompliance   L femoral bypass  Other Active Problems  Hx breast cancer  AKI - Cr 0.5  Hospital day # 14   Patient was seen and examined by me personally. I reviewed notes, independently viewed imaging studies, participated in medical decision making and plan of care. I have made additions or clarifications directly to the above note.  Documentation accurately reflects findings. The laboratory and radiographic studies were personally reviewed by me. No family member at the bedside today. Rehabilitation coordinator informs me that patient cannot be transferred to inpatient rehabilitation at this time due to copious thick secretions. We will ask pulmonary critical care to reconsult to help with secretion management.  SIGNED BY:   Antony Contras, MD     04/25/2015, 4:32 PM         To contact Stroke Continuity provider, please refer to [email or web address] After hours, contact General Neurology

## 2015-04-25 NOTE — Progress Notes (Signed)
PULMONARY / CRITICAL CARE MEDICINE   Name: Kerry [name] MRN: [id number] DOB: 1955/07/22    ADMISSION DATE:  04/11/2015 CONSULTATION DATE:  04/11/2015  REFERRING MD : Neurology  CHIEF COMPLAINT:  Rt sided weakness  INITIAL PRESENTATION:  59 y/o female smoker presented with Rt sided weakness and difficulty with speech from LT MCA CVA s/p limited dosing of tPA and embolectomy with stent.  Remained on vent post-procedure and PCCM consulted.  STUDIES:  10/19 CT head >> Lt MCA infarct 10/20 ECHO >> EF 55 to 60%  SIGNIFICANT EVENTS: 10/19  Admit, limited tPA, embolectomy with stenting 10/20  start 3% NS 10/21  Bradycardia from propofol, fever 10/26  trach, peg, & bronch w/ BAL 10/28  started Vanc for HCAP dc 'd for mssa 10/29  ABX transitioned to ancef  11/01  Tolerating ATC  SUBJECTIVE:  RN reports pt with increased tracheal secretions - yellow brown in color. Required 3 episodes of suctioning this am. Pt is able to cough and clear secretions via trach.  No respiratory distress.  On ATC    VITAL SIGNS: Temp:  [98.1 F (36.7 C)-99.1 F (37.3 C)] 99.1 F (37.3 C) (11/02 1052) Pulse Rate:  [57-70] 58 (11/02 1105) Resp:  [24-40] 35 (11/02 1105) BP: (103-161)/(52-76) 112/57 mmHg (11/02 1105) SpO2:  [94 %-99 %] 95 % (11/02 1105) FiO2 (%):  [28 %] 28 % (11/02 1105) Weight:  [214 lb 8.1 oz (97.3 kg)] 214 lb 8.1 oz (97.3 kg) (11/02 0305)   HEMODYNAMICS:     VENTILATOR SETTINGS: Vent Mode:  [-]  FiO2 (%):  [28 %] 28 %   INTAKE / OUTPUT:  Intake/Output Summary (Last 24 hours) at 04/25/15 1114 Last data filed at 04/25/15 4010  Gross per 24 hour  Intake   1640 ml  Output      0 ml  Net   1640 ml    PHYSICAL EXAMINATION: General: chronically ill appearing female in NAD  Integument:  Warm & dry. No rash on exposed skin. Pink foam dressing under trach site HEENT:  No scleral injection or icterus. Trach c/d/i, sutures in place, PERRL. right IJ in place. Cardiovascular:   Regular rate. No edema. Unable to appreciate JVD. Pulmonary:  Even/non-labored, lungs bilaterally with rhonchi Abdomen: Soft. Normal bowel sounds. Protuberant. PEG tube in place. Neurological: Follows commands. Spontaneously moving left lower extremity. Spontaneously moves left upper extremity.   LABS:  CBC  Recent Labs Lab 04/21/15 0357 04/22/15 0423 04/23/15 0350  WBC 10.5 10.1 8.8  HGB 9.8* 9.8* 9.6*  HCT 31.4* 32.4* 31.2*  PLT 165 194 194   BMET  Recent Labs Lab 04/22/15 0423  04/23/15 0350  04/24/15 0355  04/24/15 1655 04/24/15 2150 04/25/15 0307  NA 148*  < > 148*  < > 145  < > 143 141 139  K 3.2*  --  3.2*  --  3.5  --   --   --   --   CL 115*  --  115*  --  112*  --   --   --   --   CO2 26  --  25  --  27  --   --   --   --   BUN 14  --  11  --  10  --   --   --   --   CREATININE 0.53  --  0.42*  --  0.34*  --   --   --   --  GLUCOSE 224*  --  246*  --  209*  --   --   --   --   < > = values in this interval not displayed.   Electrolytes  Recent Labs Lab 04/19/15 0325 04/20/15 0347 04/21/15 0357 04/22/15 0423 04/23/15 0350 04/24/15 0355  CALCIUM 8.3* 8.2* 8.5* 8.0* 7.9* 8.1*  MG 2.1 2.1  --   --   --   --   PHOS 3.5 2.7 2.8  --   --   --    Liver Enzymes  Recent Labs Lab 04/20/15 0347 04/21/15 0357 04/22/15 0423  AST  --   --  47*  ALT  --   --  48  ALKPHOS  --   --  151*  BILITOT  --   --  0.4  ALBUMIN 1.8* 1.8* 1.8*   Glucose  Recent Labs Lab 04/24/15 1054 04/24/15 1500 04/24/15 1909 04/24/15 2323 04/25/15 0252 04/25/15 0728  GLUCAP 157* 151* 183* 166* 149* 122*    Imaging No results found.   ASSESSMENT / PLAN: NEUROLOGIC A:   Lt MCA infarct - s/p limited tPA and embolectomy with stenting Cytotoxic edema P:   Plavix daily Minimize sedating medications Further management per Neurology   PULMONARY ETT 10/19 >> 10/26 Percutaneous Trach 10/26>> A: Compromised airway in setting of CVA MSSA RLL HCAP - BAL from 10/26  with MSSA, repeat 11/1 pending  Tobacco abuse P:   Follow sputum culture from 11/1  Albuterol neb q6hr for secretions ATC as tolerated  Pulmonary hygiene as tolerated - mobilize per PT Will follow for trach management End date added to abx, 8 days total Assessed CXR 11/2 to evaluate PNA given increased tracheal secretions (pt able to cough and clear secretions)  CARDIOVASCULAR Rt IJ CVL 10/20 >> A:  Malignant HTN - resolved.  Hx of CAD, HTN, PAD, HLD. Bradycardia - pacer removed. Previously evaluated by cardiology. P:  Continue pravachol  Discussed BP with Dr [name] > continue lisinopril, amlodipine, hydralazine, clonidine   Plavix daily Labetalol & Hydralazine IV prn Further management per primary SVC   RENAL A:   Hypernatremia - Medically induced. Improving. Hypokalemia - Replacing via tube P:   KCl via peg tube bid Monitor renal function with daily BUN/Creatinine Further management per primary SVC  GASTROINTESTINAL A:   S/P PEG P:   TF per nutrition  Protonix via tube Defer mgmt to primary svc  HEMATOLOGIC A:  Anemia - Trending down. No signs of active bleeding. P:  Trending Hgb daily w/ CBC SCDs Heparin [place] q8hr  INFECTIOUS A:   HCAP - MSSA 1/ E coli UTI P: BAL (10/26)>> Staph aureus >> MSSA (10/26) Urine Ctx (10/26) - E coli (pan-sens)  Vancomycin 10/28>> 10/29 Rocephin 1gm IV q24hr for UTI 10/28>> 10/29 Ancef 10/29 >> (day 4 of 8 on 10/31)  ENDOCRINE A:  H/O DM P:   Lantus 30 units qhs Accuchecks q4hr SSI coverage  FAMILY DISCUSSION:   No family available at bedside.  Continue current therapy for MSSA HCAP.     Noe Gens, NP-C [facility] Pgr: [phone] or if no answer [phone] 04/25/2015, 11:15 AM

## 2015-04-25 NOTE — Progress Notes (Signed)
Noe Gens, NP paged a second time to ask if the morning sodium lab is still needed.  Lab has the specimen and results, but cannot post it as the order has been D/Cd by Ms. Ollis.

## 2015-04-25 NOTE — Progress Notes (Signed)
PT Cancellation Note  Patient Details Name: Kerry [name] MRN: [id number] DOB: [birth year]   Cancelled Treatment:    Reason Eval/Treat Not Completed: Patient at procedure or test/unavailable. Attempted to see pt x2.  RN working w/ pt on first attempt and then pt having chest x-ray on second attempt.  Will follow up w/ pt tomorrow.    Joslyn Hy PT, DPT [phone] Pager: [phone] 04/25/2015, 12:08 PM

## 2015-04-26 DIAGNOSIS — J9610 Chronic respiratory failure, unspecified whether with hypoxia or hypercapnia: Secondary | ICD-10-CM | POA: Insufficient documentation

## 2015-04-26 LAB — GLUCOSE, CAPILLARY
GLUCOSE-CAPILLARY: 146 mg/dL — AB (ref 65–99)
GLUCOSE-CAPILLARY: 162 mg/dL — AB (ref 65–99)
GLUCOSE-CAPILLARY: 186 mg/dL — AB (ref 65–99)
Glucose-Capillary: 143 mg/dL — ABNORMAL HIGH (ref 65–99)
Glucose-Capillary: 137 mg/dL — ABNORMAL HIGH (ref 65–99)
Glucose-Capillary: 108 mg/dL — ABNORMAL HIGH (ref 65–99)

## 2015-04-26 LAB — BASIC METABOLIC PANEL
ANION GAP: 6 (ref 5–15)
BUN: 9 mg/dL (ref 6–20)
CHLORIDE: 109 mmol/L (ref 101–111)
CO2: 26 mmol/L (ref 22–32)
Calcium: 8.1 mg/dL — ABNORMAL LOW (ref 8.9–10.3)
Creatinine, Ser: 0.39 mg/dL — ABNORMAL LOW (ref 0.44–1.00)
GFR calc Af Amer: >60 mL/min (ref >60)
GLUCOSE: 149 mg/dL — AB (ref 65–99)
POTASSIUM: 3.6 mmol/L (ref 3.5–5.1)
Sodium: 141 mmol/L (ref 135–145)

## 2015-04-26 MED ORDER — FUROSEMIDE 10 MG/ML IJ SOLN
20.0000 mg | Freq: Once | INTRAMUSCULAR | Status: AC
  Administered 2015-04-26: 20 mg via INTRAVENOUS
  Filled 2015-04-26: qty 2

## 2015-04-26 MED ORDER — ACETYLCYSTEINE 10 % IN SOLN
2.0000 mL | Freq: Four times a day (QID) | RESPIRATORY_TRACT | Status: DC
  Filled 2015-04-26 (×4): qty 4

## 2015-04-26 MED ORDER — ACETYLCYSTEINE 20 % IN SOLN
2.0000 mL | Freq: Four times a day (QID) | RESPIRATORY_TRACT | Status: DC
  Administered 2015-04-26: 2 mL via RESPIRATORY_TRACT

## 2015-04-26 MED ORDER — ACETYLCYSTEINE 20 % IN SOLN
3.0000 mL | Freq: Four times a day (QID) | RESPIRATORY_TRACT | Status: DC
  Administered 2015-04-26 – 2015-04-27 (×5): 3 mL via RESPIRATORY_TRACT
  Filled 2015-04-26 (×5): qty 4

## 2015-04-26 MED ORDER — DEXTROSE 5 % IV SOLN
2.0000 g | Freq: Three times a day (TID) | INTRAVENOUS | Status: DC
  Administered 2015-04-26 – 2015-04-27 (×4): 2 g via INTRAVENOUS
  Filled 2015-04-26 (×6): qty 2

## 2015-04-26 NOTE — Progress Notes (Signed)
Occupational Therapy Treatment Patient Details Name: Kerry [name] MRN: [id number] DOB: 08-09-1955 Today's Date: 04/26/2015    History of present illness pt presents with dense L MCA Infarct involving L Temporal, Frontoparietal, and L Basal Ganglia regions and was started on TPA, but unable to complete full dose due to hypertension, but did recieve IR intervention. Pt extubated 10/30. pt with hx of DM, HTN, Breast CA s/p Mastectomy, and Fibromyalgia.  10/26 trach/peg.   OT comments  Seen as cotreat with PT. Overlap end of session with ST to attempt use of PMV in seated position EOB. Pt making steady progress. Alert during session. Using LUE for functional tasks (washing face and combing hair). Min A at times for midline postural control in sitting when not pushing to R. Improved head/trunk control overall. Flaccid RUE with inferior subluxation noted. Asked nurse/family to keep 2 pillows under R elbow to support arm.  Pt smiling at times during session. Family present at end of session and updated on progress. VSS throughout session. Initial goals met and updated. Continue to recommend CIR for rehab as pt is demonstrating excellent progress since initital eval. Will follow acutely.  Follow Up Recommendations  CIR;Supervision/Assistance - 24 hour    Equipment Recommendations  3 in 1 bedside comode;Tub/shower bench    Recommendations for Other Services Rehab consult    Precautions / Restrictions Precautions Precautions: Fall Precaution Comments: trach/peg       Mobility Bed Mobility Overal bed mobility: +2 for physical assistance;Needs Assistance Bed Mobility: Supine to Sit;Sit to Supine     Supine to sit: Max assist;+2 for physical assistance (Pt initiating attempts to roll to L) Sit to supine: Total assist;+2 for physical assistance      Transfers Overall transfer level: Needs assistance   Transfers: Sit to/from Stand Sit to Stand: Total assist         General  transfer comment: Pt unable to stand at this time. not initiating sit - stand. Most likely apraxic    Balance Overall balance assessment: Needs assistance   Sitting balance-Leahy Scale: Poor Sitting balance - Comments: improved sitting balance. Pushing to R when hand on rail. Able to maintain improved midline postural control with L hand on lap. posterior and R bais, however, Min A required at times to maintain balance                           ADL Overall ADL's : Needs assistance/impaired Eating/Feeding: NPO   Grooming: Wash/dry hands;Brushing hair;Sitting Grooming Details (indicate cue type and reason): Pt able to wash face using L hand on command. Pt attempting to comb hair - both R/L sides. Limited by fatigue                               General ADL Comments: Increased participation with ADL tasks      Vision Eye Alignment: Within Functional Limits Alignment/Gaze Preference: Gaze left;Other (comment) (however will gaze into R field) Ocular Range of Motion: Within Functional Limits Tracking/Visual Pursuits: Impaired - to be further tested in functional context         Additional Comments: will continue to assess   Perception     Praxis  Ideomotor apraxia. Perseverates    Cognition   Behavior During Therapy: Flat affect (smiling occaisonally) Overall Cognitive Status: Impaired/Different from baseline Area of Impairment: Attention;Following commands;Awareness;Problem solving   Current Attention Level: Sustained  Following Commands: Follows one step commands inconsistently   Awareness: Intellectual Problem Solving: Slow processing;Decreased initiation;Difficulty sequencing;Requires verbal cues;Requires tactile cues General Comments: perseveration noted    Extremity/Trunk Assessment   Falccid RUE. No movement noted. Brunstrom stage 1. Inferior subluxation noted. Will benefit from kinesiotaping of shoulder            Exercises Other  Exercises Other Exercises: PROM R UE shuolder. elbow and hand.    Shoulder Instructions       General Comments      Pertinent Vitals/ Pain       Pain Assessment: Faces Faces Pain Scale: No hurtVSS  Home Living                                          Prior Functioning/Environment              Frequency Min 2X/week     Progress Toward Goals  OT Goals(current goals can now be found in the care plan section)  Progress towards OT goals: Goals met and updated - see care plan  Acute Rehab OT Goals Patient Stated Goal: family  goal is for pt to go to rehab OT Goal Formulation: With family Time For Goal Achievement: 05/03/15 Potential to Achieve Goals: Good ADL Goals Pt Will Perform Grooming: with min assist;sitting (with gestural cues) Pt Will Perform Upper Body Bathing: with min assist;sitting Pt Will Transfer to Toilet: with +2 assist;with mod assist;bedside commode Additional ADL Goal #1: Maintain midline postural control x 10 min with min  cues in preparation for ADL tasks Additional ADL Goal #2: Follow 1 step commands with 50% accuracy during ADL task Additional ADL Goal #3: Family will complete PROM and positioning  of RUE with S  Plan Discharge plan remains appropriate    Co-evaluation    PT/OT/SLP Co-Evaluation/Treatment: Yes Reason for Co-Treatment: Complexity of the patient's impairments (multi-system involvement);For patient/therapist safety   OT goals addressed during session: ADL's and self-care;Strengthening/ROM      End of Session Equipment Utilized During Treatment: Oxygen   Activity Tolerance Patient tolerated treatment well   Patient Left in bed;with call bell/phone within reach;with family/visitor present;with nursing/sitter in room;with SCD's reapplied   Nurse Communication Mobility status;Other (comment) (keep 2 pillows under R UE at all times)        Time: 8118-8677 OT Time Calculation (min): 25 min  Charges: OT  General Charges $OT Visit: 1 Procedure OT Treatments $Therapeutic Activity: 8-22 mins  Dia Donate,HILLARY 04/26/2015, 1:30 PM   [facility], OTR/L  [phone] 04/26/2015

## 2015-04-26 NOTE — Progress Notes (Signed)
PULMONARY / CRITICAL CARE MEDICINE   Name: Kerry [name] MRN: [id number] DOB: 1956/03/29    ADMISSION DATE:  04/11/2015 CONSULTATION DATE:  04/11/2015  REFERRING MD : Neurology  CHIEF COMPLAINT:  Rt sided weakness  INITIAL PRESENTATION:  59 y/o female smoker presented with Rt sided weakness and difficulty with speech from LT MCA CVA s/p limited dosing of tPA and embolectomy with stent.  Remained on vent post-procedure and PCCM consulted.  STUDIES:  10/19 CT head >> Lt MCA infarct 10/20 ECHO >> EF 55 to 60%  SIGNIFICANT EVENTS: 10/19  Admit, limited tPA, embolectomy with stenting 10/20  start 3% NS 10/21  Bradycardia from propofol, fever 10/26  trach, peg, & bronch w/ BAL 10/28  started Vanc for HCAP dc 'd for mssa 10/29  ABX transitioned to ancef  11/01  Tolerating ATC  04/25/15 RN reports pt with increased tracheal secretions - yellow brown in color. Required 3 episodes of suctioning this am. Pt is able to cough and clear secretions via trach.  No respiratory distress.  On ATC    SUBJECTIVE/OVERNIGHT/INTERVAL HX  04/26/15: thick secretions continue per review of chart and d/w neuro resident. 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 No other change. Good cough reported   VITAL SIGNS: Temp:  [99.1 F (37.3 C)-99.5 F (37.5 C)] 99.5 F (37.5 C) (11/03 0000) Pulse Rate:  [58-80] 77 (11/03 0901) Resp:  [19-35] 24 (11/03 0901) BP: (112-173)/(43-93) 113/93 mmHg (11/03 0910) SpO2:  [93 %-99 %] 97 % (11/03 0901) FiO2 (%):  [28 %] 28 % (11/03 0901) Weight:  [96.4 kg (212 lb 8.4 oz)] 96.4 kg (212 lb 8.4 oz) (11/03 0400)   HEMODYNAMICS:     VENTILATOR SETTINGS: Vent Mode:  [-]  FiO2 (%):  [28 %] 28 %   INTAKE / OUTPUT:  Intake/Output Summary (Last 24 hours) at 04/26/15 1048 Last data filed at 04/26/15 0915  Gross per 24 hour  Intake   2175 ml  Output      0 ml  Net   2175 ml    PHYSICAL EXAMINATION: General: chronically ill appearing female in NAD  Integument:   Warm & dry. No rash on exposed skin. Pink foam dressing under trach site HEENT:  No scleral injection or icterus. Trach c/d/i, sutures in place, PERRL. right IJ in place. Cardiovascular:  Regular rate. No edema. Unable to appreciate JVD. Pulmonary:  Even/non-labored, lungs bilaterally with rhonchi Abdomen: Soft. Normal bowel sounds. Protuberant. PEG tube in place. Neurological: Follows commands. Spontaneously moving left lower extremity. Spontaneously moves left upper extremity.   LABS:  PULMONARY No results for input(s): PHART, PCO2ART, PO2ART, HCO3, TCO2, O2SAT in the last 168 hours.  Invalid input(s): PCO2, PO2  CBC  Recent Labs Lab 04/21/15 0357 04/22/15 0423 04/23/15 0350  HGB 9.8* 9.8* 9.6*  HCT 31.4* 32.4* 31.2*  WBC 10.5 10.1 8.8  PLT 165 194 194    COAGULATION No results for input(s): INR in the last 168 hours.  CARDIAC  No results for input(s): TROPONINI in the last 168 hours. No results for input(s): PROBNP in the last 168 hours.   CHEMISTRY  Recent Labs Lab 04/20/15 0347  04/21/15 0357  04/22/15 0423  04/23/15 0350  04/24/15 0355 04/24/15 1030 04/24/15 1655 04/24/15 2150 04/25/15 0307 04/26/15 0415  NA 154*  < > 153*  < > 148*  < > 148*  < > 145 145 143 141 139 141  K 3.0*  --  3.2*  --  3.2*  --  3.2*  --  3.5  --   --   --   --  3.6  CL 120*  --  119*  --  115*  --  115*  --  112*  --   --   --   --  109  CO2 26  --  26  --  26  --  25  --  27  --   --   --   --  26  GLUCOSE 233*  --  174*  --  224*  --  246*  --  209*  --   --   --   --  149*  BUN 20  --  17  --  14  --  11  --  10  --   --   --   --  9  CREATININE 0.64  --  0.49  --  0.53  --  0.42*  --  0.34*  --   --   --   --  0.39*  CALCIUM 8.2*  --  8.5*  --  8.0*  --  7.9*  --  8.1*  --   --   --   --  8.1*  MG 2.1  --   --   --   --   --   --   --   --   --   --   --   --   --   PHOS 2.7  --  2.8  --   --   --   --   --   --   --   --   --   --   --   < > = values in this interval not  displayed. Estimated Creatinine Clearance: 90.8 mL/min (by C-G formula based on Cr of 0.39).   LIVER  Recent Labs Lab 04/20/15 0347 04/21/15 0357 04/22/15 0423  AST  --   --  47*  ALT  --   --  48  ALKPHOS  --   --  151*  BILITOT  --   --  0.4  PROT  --   --  5.3*  ALBUMIN 1.8* 1.8* 1.8*     INFECTIOUS No results for input(s): LATICACIDVEN, PROCALCITON in the last 168 hours.   ENDOCRINE CBG (last 3)   Recent Labs  04/26/15 0024 04/26/15 0403 04/26/15 0848  GLUCAP 186* 143* 137*   Recent Results (from the past 240 hour(s))  Urine culture     Status: None   Collection Time: 04/18/15  9:40 AM  Result Value Ref Range Status   Specimen Description URINE, CATHETERIZED  Final   Special Requests Normal  Final   Culture >=100,000 COLONIES/mL ESCHERICHIA COLI  Final   Report Status 04/20/2015 FINAL  Final   Organism ID, Bacteria ESCHERICHIA COLI  Final      Susceptibility   Escherichia coli - MIC*    AMPICILLIN <=2 SENSITIVE Sensitive     CEFAZOLIN <=4 SENSITIVE Sensitive     CEFTRIAXONE <=1 SENSITIVE Sensitive     CIPROFLOXACIN <=0.25 SENSITIVE Sensitive     GENTAMICIN <=1 SENSITIVE Sensitive     IMIPENEM <=0.25 SENSITIVE Sensitive     NITROFURANTOIN <=16 SENSITIVE Sensitive     TRIMETH/SULFA <=20 SENSITIVE Sensitive     AMPICILLIN/SULBACTAM <=2 SENSITIVE Sensitive     PIP/TAZO <=4 SENSITIVE Sensitive     * >=100,000 COLONIES/mL ESCHERICHIA COLI  Culture, respiratory (NON-Expectorated)     Status: None  Collection Time: 04/18/15  3:08 PM  Result Value Ref Range Status   Specimen Description TRACHEAL ASPIRATE  Final   Special Requests Normal  Final   Gram Stain   Final    MODERATE WBC PRESENT,BOTH PMN AND MONONUCLEAR RARE SQUAMOUS EPITHELIAL CELLS PRESENT RARE GRAM POSITIVE COCCI IN PAIRS IN CLUSTERS RARE GRAM NEGATIVE RODS Performed at [facility]    Culture   Final    FEW STAPHYLOCOCCUS AUREUS Note: RIFAMPIN AND GENTAMICIN SHOULD NOT BE USED AS  SINGLE DRUGS FOR TREATMENT OF STAPH INFECTIONS. Performed at [facility]    Report Status 04/21/2015 FINAL  Final   Organism ID, Bacteria STAPHYLOCOCCUS AUREUS  Final      Susceptibility   Staphylococcus aureus - MIC*    CLINDAMYCIN >=8 RESISTANT Resistant     ERYTHROMYCIN >=8 RESISTANT Resistant     GENTAMICIN <=0.5 SENSITIVE Sensitive     LEVOFLOXACIN <=0.12 SENSITIVE Sensitive     OXACILLIN 0.5 SENSITIVE Sensitive     RIFAMPIN <=0.5 SENSITIVE Sensitive     TRIMETH/SULFA <=10 SENSITIVE Sensitive     VANCOMYCIN 1 SENSITIVE Sensitive     TETRACYCLINE <=1 SENSITIVE Sensitive     MOXIFLOXACIN <=0.25 SENSITIVE Sensitive     * FEW STAPHYLOCOCCUS AUREUS  Culture, respiratory (NON-Expectorated)     Status: None (Preliminary result)   Collection Time: 04/24/15 11:17 AM  Result Value Ref Range Status   Specimen Description TRACHEAL ASPIRATE  Final   Special Requests NONE  Final   Gram Stain   Final    ABUNDANT WBC PRESENT, PREDOMINANTLY PMN RARE SQUAMOUS EPITHELIAL CELLS PRESENT MODERATE GRAM NEGATIVE COCCOBACILLI RARE GRAM POSITIVE COCCI IN PAIRS    Culture   Final    FEW CANDIDA ALBICANS Performed at [facility]    Report Status PENDING  Incomplete          IMAGING x48h  - image(s) personally visualized  -   highlighted in bold Dg Chest Port 1 View  04/25/2015  CLINICAL DATA:  Acute respiratory failure. Increased tracheal secretions. EXAM: PORTABLE CHEST 1 VIEW COMPARISON:  04/23/2015 FINDINGS: Since the prior study, opacity has developed at the lung bases obscuring hemidiaphragms, right greater than left. There is also irregular interstitial thickening bilaterally with some hazy mid to lower lung zone airspace opacity. No pneumothorax. Cardiac silhouette is mildly enlarged. No mediastinal or hilar masses. Right PICC and tracheostomy are stable and well positioned. IMPRESSION: 1. Increased opacity the lung bases, right greater than left, as well as increased  bilateral interstitial thickening and mild hazy lower lung zone airspace opacity. This along with mild cardiomegaly suggests congestive heart failure with bilateral pleural effusions. Electronically Signed   By: Lajean Manes M.D.   On: 04/25/2015 12:27     ASSESSMENT / PLAN: NEUROLOGIC A:   Lt MCA infarct - s/p limited tPA and embolectomy with stenting Cytotoxic edema P:    management per Neurology   PULMONARY ETT 10/19 >> 10/26 Percutaneous Trach 10/26>> A: Compromised airway in setting of CVA MSSA RLL HCAP - BAL from 10/26 with MSSA, repeat 11/1 pending but GM stain with possible gm negative and cxr worsening 06/24/14   P:   Follow sputum culture from 11/1  Add mucomyst x 36h from 06/25/14 Albuterol neb q6hr for secretions ATC as tolerated  Pulmonary hygiene as tolerated - mobilize per PT Will follow for trach management Lasix daily Ceftaz starting 04/26/2015   INFECTIOUS A:   HCAP - MSSA 04/18/15 and 04/21/15 E coli  UTI 03/2615 P: BAL (10/26)>> Staph aureus >> MSSA (10/26) Urine Ctx (10/26) - E coli (pan-sens)  Vancomycin 10/28>> 10/29 Rocephin 1gm IV q24hr for UTI 10/28>> 10/29 Ancef 10/29 >> ? Stop date Ceftaz 04/26/15 >  FAMILY DISCUSSION:   Mom updated at bedside   Dr. Brand Males, M.D., [facility].C.P Pulmonary and Critical Care Medicine Staff Physician [facility] Pager: [phone], If no answer or between  15:00h - 7:00h: call [phone]  04/26/2015 10:49 AM

## 2015-04-26 NOTE — Progress Notes (Signed)
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 Pt extubated 10/30. pt with hx of DM, HTN, Breast CA s/p Mastectomy, and Fibromyalgia.  10/26 trach/peg.    PT Comments    Progressing with ability to initiate rolling as well as improved sitting balance.  Just completed PMSV trial with SLP and suctioned for secretions so though already fatigued, able to demonstrate initiation of grooming tasks with OT with multimodal cues as well as maintain upright for several minute prior to fatigue.  Will need ongoing interdisciplinary therapies to maximize functional independence prior to d/c home with family assist.  Follow Up Recommendations  CIR     Equipment Recommendations  Other (comment) (TBD)    Recommendations for Other Services       Precautions / Restrictions Precautions Precautions: Fall Precaution Comments: trach/peg    Mobility  Bed Mobility Overal bed mobility: +2 for physical assistance;Needs Assistance Bed Mobility: Rolling;Sidelying to Sit;Sit to Sidelying Rolling: +2 for physical assistance;Mod assist Sidelying to sit: +2 for physical assistance;Max assist Supine to sit: Max assist;+2 for physical assistance (Pt initiating attempts to roll to L) Sit to supine: Total assist;+2 for physical assistance Sit to sidelying: +2 for physical assistance;Total assist General bed mobility comments: patient initiating roll to left with cues/facilitation, able to help foming up to sit pushing with left UE  Transfers Overall transfer level: Needs assistance   Transfers: Sit to/from Stand Sit to Stand: Total assist         General transfer comment: Pt unable to  stand at this time. not initiating sit - stand  Ambulation/Gait                 Stairs            Wheelchair Mobility    Modified Rankin (Stroke Patients Only) Modified Rankin (Stroke Patients Only) Pre-Morbid Rankin Score: No symptoms Modified Rankin: Severe disability     Balance Overall balance assessment: Needs assistance Sitting-balance support: Single extremity supported;Feet supported;No upper extremity supported Sitting balance-Leahy Scale: Poor Sitting balance - Comments: improved sitting balance. Pushing to R when hand on rail. Able to maintain improved midline postural control with L hand on lap. posterior and R bais, however, Min A required at times to maintain balance                            Cognition Arousal/Alertness: Awake/alert Behavior During Therapy: Flat affect Overall Cognitive Status: Impaired/Different from baseline Area of Impairment: Attention;Following commands;Awareness;Problem solving   Current Attention Level: Sustained   Following Commands: Follows one step commands inconsistently   Awareness: Intellectual Problem Solving: Slow processing;Decreased initiation;Difficulty sequencing;Requires verbal cues;Requires tactile cues General Comments: perseveration noted    Exercises Other Exercises Other Exercises: PROM R UE shuolder. elbow and hand.     General Comments        Pertinent Vitals/Pain Pain Assessment: Faces Faces Pain Scale: No hurt    Home Living                      Prior Function            PT Goals (current goals can now be  found in the care plan section) Acute Rehab PT Goals Patient Stated Goal: family  goal is for pt to go to rehab Progress towards PT goals: Progressing toward goals    Frequency  Min 3X/week    PT Plan Current plan remains appropriate    Co-evaluation   Reason for Co-Treatment: Complexity of the patient's impairments (multi-system involvement);For  patient/therapist safety PT goals addressed during session: Mobility/safety with mobility;Balance;Strengthening/ROM OT goals addressed during session: ADL's and self-care;Strengthening/ROM     End of Session Equipment Utilized During Treatment: Oxygen Activity Tolerance: Patient limited by fatigue Patient left: in bed;with call bell/phone within reach;with restraints reapplied;with family/visitor present;with nursing/sitter in room     Time: 1115-1145 PT Time Calculation (min) (ACUTE ONLY): 30 min  Charges:  $Therapeutic Activity: 8-22 mins                    G Codes:      WYNN,CYNDI 2015/05/24, 1:59 PM  Magda Kiel, [place] 05-24-15

## 2015-04-26 NOTE — Progress Notes (Signed)
I am hopeful that pt will be medically ready to admit to inpt rehab either Friday or Saturday depending on respiratory issues. I will follow up tomorrow. [phone]

## 2015-04-26 NOTE — Progress Notes (Signed)
Patient seen for trach team follow up.  No education given at this time.  All needed equipment at bedside.  Will continue to follow.

## 2015-04-26 NOTE — Evaluation (Signed)
Passy-Muir Speaking Valve - Evaluation Patient Details  Name: Kerry [name] MRN: [id number] Date of Birth: [birth year]  Today's Date: 04/26/2015 Time: 1050-1120 SLP Time Calculation (min) (ACUTE ONLY): 30 min  Past Medical History:  Past Medical History  Diagnosis Date  . Hypertension   . Coronary artery disease   . Diabetes mellitus   . Breast cancer ([place]) 03/10/2007    Left breat  . Degenerative disc disease, cervical   . Degenerative disc disease, lumbar   . Fibromyalgia   . Hyperlipidemia   . Carpal tunnel syndrome on right    Past Surgical History:  Past Surgical History  Procedure Laterality Date  . Cardiac surgery    . Appendectomy    . Breast surgery    . Mastectomy Left 03/2007  . Cardiac catheterization  2005  . Vein surgery Right 2005  . Tubal ligation Right   . Radiology with anesthesia N/A 04/11/2015    Procedure: RADIOLOGY WITH ANESTHESIA;  Surgeon: Luanne Bras, MD;  Location: [facility];  Service: Radiology;  Laterality: N/A;  . Cardiac catheterization N/A 04/13/2015    Procedure: Temporary Pacemaker;  Surgeon: Peter M Martinique, MD;  Location: [facility];  Service: Cardiovascular;  Laterality: N/A;  . Peg placement N/A 04/18/2015    Procedure: PERCUTANEOUS ENDOSCOPIC GASTROSTOMY (PEG) PLACEMENT;  Surgeon: Judeth Horn, MD;  Location: [facility];  Service: General;  Laterality: N/A;  bedside  . Esophagogastroduodenoscopy N/A 04/18/2015    Procedure: ESOPHAGOGASTRODUODENOSCOPY (EGD);  Surgeon: Judeth Horn, MD;  Location: [facility];  Service: General;  Laterality: N/A;   HPI:  Kerry [name] is an 58 y.o. female with a history of diabetes mellitus, hypertension and hyperlipidemia brought to the emergency room following acute onset of speech difficulty as well as right facial and extremity weakness.  MCA territory stroke involving left temporal and frontoparietal regions as well as left basal ganglia area. Had embolectomy with stent, remained on  vent post procedure. Mental status did not support extubation due to cytotoxic edema, Trach placed 10/26, 11/1 tolerating trach collar. CXR shows bilateral pleural effusions, RIGHT lower lobe pna possibly due to clearance issue.   Assessment / Plan / Recommendation Clinical Impression  Pt demonstrates poor ability to tolerate PMSV for more than a few moments due to limited passage of air through the upper airway. The pt did not have audible air trapping, but after a few seconds of placement, pts respiration became striderous and RR increased to the 40s. Pt was able to phonate on exhalation and said "hi Mom" with max verbal cues. She expectorated copious thick clear oral and oropharyngeal secretions with assistance from SLP. Tracheal secretions during session were minimal to moderate, with expectoration occuring only 1-2 times.   Suspect that pt has a narrow upper airway with minimal ability to manage upper airway secretions with pooling above trach and into oropharynx. Striderous breathing also suggests narrow airway and limited passage of air around trach. Strongly suggest downsize of trach to allow better airflow to upper airway for improved cough and management of secretions with valve use. Pt may only attempt PMSV with SLP at this time.     SLP Assessment  Patient needs continued Speech Lanaguage Pathology Services    Follow Up Recommendations  Inpatient Rehab    Frequency and Duration min 3x week  2 weeks   Pertinent Vitals/Pain NA    SLP Goals Potential to Achieve Goals (ACUTE ONLY): Good   PMSV Trial  PMSV was placed for: 1 minute  Able to redirect subglottic air through upper airway: Yes Able to Attain Phonation: Yes Voice Quality: Hoarse Able to Expectorate Secretions: Yes Level of Secretion Expectoration with PMSV: Oral Breath Support for Phonation: Severely decreased Intelligibility: Intelligibility reduced Word: 25-49% accurate Phrase: Not tested Sentence: Not  tested Conversation: Not tested Respirations During Trial: (!) 40 SpO2 During Trial: 100 % Pulse During Trial: 80 Behavior: Alert   Tracheostomy Tube  Additional Tracheostomy Tube Assessment Fenestrated: No Trach Collar Period: 24 hrs/day Secretion Description: green/yellos tracheal secretions, moderate Frequency of Tracheal Suctioning: constant per RN Level of Secretion Expectoration: Tracheal    Vent Dependency  Vent Dependent: No FiO2 (%): 28 %    Cuff Deflation Trial Tolerated Cuff Deflation:  (baseline)   Kerry [name], Katherene Ponto 04/26/2015, 11:58 AM

## 2015-04-26 NOTE — Progress Notes (Signed)
Sutures removed from tach.  Lurline Idol ties changed;  Trach secured in place.  Peristomal trach care done.  Pt tolerated well.  Drain sponge to site.

## 2015-04-26 NOTE — Trach Care Team (Signed)
[place] Progression Note   Patient Details Name: Kerry [name] MRN: [id number] DOB: [birth year] Today's Date: 04/26/2015   Tracheostomy Assessment    Tracheostomy Shiley 6 mm Cuffed (Active)  Status Secured 04/26/2015  1:15 PM  Site Assessment Oozing secretions 04/26/2015  1:15 PM  Site Care Cleansed;Dried;Dressing applied 04/26/2015  4:00 AM  Inner Cannula Care Changed/new 04/25/2015  9:13 PM  Ties Assessment Secure 04/26/2015  1:15 PM  Cuff pressure (cm) 0 cm 04/26/2015 12:14 PM  Emergency Equipment at bedside Yes 04/26/2015  1:15 PM     Care Needs Suture removal post-op day #7 : 04/25/15 (sutures need to be removed 04/25/15)   Respiratory Therapy O2 Device: Tracheostomy Collar FiO2 (%): 28 % SpO2: 97 % Education: Ongoing Follow up recommendations:  (will follow for progression) Respiratory barriers to progression:  (vent dependent)    Speech Language Pathology  SLP chart review complete: Patient ready for SLP services, Request order for PMSV evaluation (request order for pmv for 10/28 if pt alert) Patient may use Passy-Muir Speech Valve: with SLP only PMSV Supervision: Full MD: Please consider changing trach tube to : Smaller size, Cuffless Recommendations for Other Services: Rehab consult Follow up Recommendations: Inpatient Rehab   Physical Therapy PT Recommendation/Assessment: Patient needs continued PT services Follow Up Recommendations: CIR PT equipment: Other (comment) (TBD)    Occupational Therapy OT Recommendation/Assessment: Patient needs continued OT Services Follow Up Recommendations: CIR, Supervision/Assistance - 24 hour OT Equipment: 3 in 1 bedside comode, Tub/shower bench    Nutritional Patient's Current Diet: Tube feeding Tube Feeding: Vital AF 1.2 Cal Tube Feeding Frequency: Continuous Tube Feeding Strength: Full strength    Case Management/Social Work Level of patient care prior to hospitalization: Home-Self care Living status:  Spouse Insurance payer:  Secretary/administrator) Anticipated discharge disposition:  (SNF vs CIR vs home health)    Provider [place] Team/Provider                          Recommendations [place] Team Members Present-  Phillis Knack, RT, Alvino Blood, SW,  Herbie Baltimore, SLP     SLP seeing pt. for PMSV. Has increased secretions. ?? downsizing trach.          Asif Muchow, Jaci Carrel 04/26/2015, 2:18 PM  Scribe for Team

## 2015-04-26 NOTE — Progress Notes (Signed)
STROKE TEAM PROGRESS NOTE  HISTORY  Kerry [name] is an 59 y.o. female with a history of diabetes mellitus, hypertension and hyperlipidemia brought to the emergency room following acute onset of speech difficulty as well as right facial and extremity weakness. Patient has no previous history of stroke nor TIA. She has not been on antiplatelet therapy. CT scan of her head showed no signs of acute hemorrhage. Dense left MCA was noted, indicative of acute thrombus. Patient was deemed a candidate for TPA which was started. She had repeated problems with hypertension requiring intervention with IV labetalol as well as hydralazine. Complete dose of TPA was not administered. Patient was taken to interventional radiology for further management following a repeat CT scan of her head to rule out intracranial hemorrhage after worsening of right hemiparesis and development of lethargy. Repeat study showed no signs of acute intracranial hemorrhage. Study did show early signs of possible MCA territory stroke involving left temporal and frontoparietal regions as well as left basal ganglia area.  LSN: 6:30 AM on 04/11/2015 tPA Given: Yes mRankin:   SUBJECTIVE (INTERVAL HISTORY) Patient tolerating trach collar but having copious thick secretions requiring frequent suctioning.    Does   follow occasional commands on left  and will mimic at times.PCCM following trach secretions. Resp culture shows mixed cocccobacilli and Gram positive cocci. Will add gram negative coverage antibiotics  OBJECTIVE Temp:  [99.1 F (37.3 C)-99.5 F (37.5 C)] 99.5 F (37.5 C) (11/03 0000) Pulse Rate:  [58-80] 77 (11/03 0901) Cardiac Rhythm:  [-] Sinus bradycardia (11/03 0715) Resp:  [19-35] 24 (11/03 0901) BP: (112-173)/(43-93) 113/93 mmHg (11/03 0910) SpO2:  [93 %-99 %] 97 % (11/03 0901) FiO2 (%):  [28 %] 28 % (11/03 0901) Weight:  [212 lb 8.4 oz (96.4 kg)] 212 lb 8.4 oz (96.4 kg) (11/03 0400)  CBC:   Recent Labs Lab  04/22/15 0423 04/23/15 0350  WBC 10.1 8.8  NEUTROABS 7.3 5.8  HGB 9.8* 9.6*  HCT 32.4* 31.2*  MCV 93.1 92.0  PLT 194 952    Basic Metabolic Panel:   Recent Labs Lab 04/20/15 0347  04/21/15 0357  04/24/15 0355  04/25/15 0307 04/26/15 0415  NA 154*  < > 153*  < > 145  < > 139 141  K 3.0*  --  3.2*  < > 3.5  --   --  3.6  CL 120*  --  119*  < > 112*  --   --  109  CO2 26  --  26  < > 27  --   --  26  GLUCOSE 233*  --  174*  < > 209*  --   --  149*  BUN 20  --  17  < > 10  --   --  9  CREATININE 0.64  --  0.49  < > 0.34*  --   --  0.39*  CALCIUM 8.2*  --  8.5*  < > 8.1*  --   --  8.1*  MG 2.1  --   --   --   --   --   --   --   PHOS 2.7  --  2.8  --   --   --   --   --   < > = values in this interval not displayed.  Lipid Panel:     Component Value Date/Time   CHOL 208* 04/12/2015 0315   TRIG 231* 04/12/2015 0315   HDL 29* 04/12/2015 0315  CHOLHDL 7.2 04/12/2015 0315   VLDL 46* 04/12/2015 0315   LDLCALC 133* 04/12/2015 0315   HgbA1c:  Lab Results  Component Value Date   HGBA1C 11.4* 04/12/2015   Urine Drug Screen: No results found for: LABOPIA, COCAINSCRNUR, LABBENZ, AMPHETMU, THCU, LABBARB    IMAGING I have personally reviewed the radiological images below and agree with the radiology interpretations.  Ct Head Wo Contrast  04/17/2015  1. Mild interval evolution of large left MCA territory infarct, with associated edema. Slight rightward midline shift, measuring 2-3 mm, with partial effacement of the left lateral ventricle. Scattered foci of associated hemorrhage are perhaps slightly more diffuse than on the prior study. 2. Mild small vessel ischemic microangiopathy.  04/12/2105  1. Large acute left MCA territory infarct with resolving associated hemorrhage. Similar local mass effect without midline shift. 2. No new intracranial process.  04/12/2015  Acute large LEFT MCA territory infarct with similar patchy areas of hemorrhage. Local mass effect without  midline shift. Mildly dense intracranial vessels most compatible with residual contrast.   04/11/2015  Acute hemorrhage in the left temporal lobe. This may be within the sylvian fissure rather than the insular cortex. There are small gas bubbles in the area of hemorrhage which may be extra vascular gas in the subarachnoid space. This is likely related to the procedure and micro perforation of a vessel. Acute infarct left temporal lobe and possibly left basal ganglia. There is local edema without midline shift   04/11/2015  Slightly more conspicuous loss of gray-white differentiation affecting the left temporal lobe and frontoparietal regions in the left basal ganglia consistent with acute left MCA infarction. Aspects score difficult because of some motion, but estimated at 5 or 6.   04/11/2015  CT findings consistent with early left MCA infarct with a dense left MCA sign. No mass effect or hemorrhage.   CT chest without contrast 04/20/2015 1. Consolidation at the RIGHT lung base suggest pneumonia.   Recommend follow-up contrast chest CT to exclude underlying mass lesion. 2. Small bilateral pleural effusions. 3. No pulmonary edema  2D Echocardiogram   - Left ventricle: The cavity size was normal. Systolic function wasnormal. The estimated ejection fraction was in the range of 55%to 60%. Wall motion was normal; there were no regional wallmotion abnormalities. - Aortic valve: Valve area (VTI): 2.24 cm^2. Valve area (Vmax): 2cm^2. Valve area (Vmean): 2.04 cm^2. - Atrial septum: No defect or patent foramen ovale was identified.    Physical Exam  General: The patient is s/p trach, drowsy, open eyes on pain stimulation, tracking but not following simple commands. Off fentanyl. HEENT:  NCAT; Sclera clear; intubated  Respiratory: Clear lung fields  Cardiovascular: Regular rate and rhythm  Abdomen: Soft, nontender, positive bowel sounds  Skin: 1+ edema in the legs noted..  Neurologic  Exam Drowsy but awakens,  Eyes open. tracking with eyes but not following simple commands mainly on left side. Both eyes conjugated and tracking objects on the left, pupil left 3.16mm and right 71mm, both sluggish to light, difference remained same at light or dark. Doll's eyes present. Right facial droop. Positive corneal and gag. Left UE localized to pain, right UE 0/5 on pain. Left LE against gravity, RLE 2/5 on pain stimulation. Reflex 1+ and right babinski positive.   ASSESSMENT/PLAN Kerry [name] is a 59 y.o. female with history of diabetes mellitus, hypertension, hyperlipidemia and fibromyalgia presenting with new onset speech difficulty and right hemiparesis. She received IV t-PA (total 50 mg, 19 mg not  received) due to elevated BP. 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   Resultant  Respiratory failure, right hemiparesis, cerebral edema  Repeat CT head large L MCA infarct, mild midline shift  2D Echo  EF 55-60%, no source of embolus  LDL 133  HgbA1c 11.4    VTE prophylaxis - subcutaneous heparin Diet NPO time specified  No antithrombotic prior to admission.now on Plavix 75 mg daily  Ongoing aggressive stroke risk factor management  PEG and trach done on 04/18/15  Therapy recommendations:  pending   Disposition:  pending   Cytotoxic cerebral edema  Repeat CT showed mild midline shift  Started 3% saline 04/13/2015, off 04/16/15  Na 159 -> 157 -> 155 ->159 -> 154 -> 149  Carotid stenosis  L ICA occlusion  S/p acute stent placement (Dr. Arizona Constable at [facility])  Switched from ASA to plavix for carotid stenting after procedure  Acute respiratory Failure  Intubated for  procedure  Unable to protect airway  S/p trach, now on CPAP with PS  Malignant Hypertension  BP 179/82 on arrival, as high as 213/97  Treated with cardene drip, off   BP on the high side  Increase lisinopril to 20mg  bid  Hyperlipidemia  Home meds:  pravachol 40  LDL 133, goal < 70  Resumed home meds  Continue statin at discharge  Bradycardia - likely due to carotid stent placement  Down to the 30s both pre-IR and post CL placement  BB not recommended  Cardiology has seen, external pacemaker placed and then removed 04/16/15  HR stable  Diabetes type II  HgbA1c 11.4 , goal < 7.0  On lantus 30 u daily PTA, on tube feeding, resume lantus 30 unit.  SSI  CBG monitoring  Other Stroke Risk Factors  Cigarette smoker, quit smoking  ETOH use  Obesity, Body mass index is 34.32 kg/(m^2).   Coronary artery disease  Noncompliance   L femoral bypass  Other Active Problems  Hx breast cancer  AKI - Cr 0.5  Traceobronchitis- will add [facility] day # 15   Patient was seen and examined by me personally. I reviewed notes, independently viewed imaging studies, participated in medical decision making and plan of care. I have made additions or clarifications directly to the above note.  Documentation accurately reflects findings. The laboratory and radiographic studies were personally reviewed by me. No family member at the bedside today. Awaiting Rehabilitation coordinator to decide when patient will be  transferred to inpatient rehabilitation  . Appreciate help from pulmonary critical care to reconsult to help with secretion management.  SIGNED BY:   Antony Contras, MD     04/26/2015, 10:23 AM         To contact Stroke Continuity provider, please refer to [email or web address] After hours, contact General Neurology

## 2015-04-26 NOTE — Progress Notes (Signed)
ANTIBIOTIC CONSULT NOTE - INITIAL  Pharmacy Consult for ceftazidime Indication: MSSA PNA  Allergies  Allergen Reactions  . Oxycodone     Sick   . Oxycontin [Oxycodone Hcl]     Sick     Patient Measurements: Height: 5\' 6"  (167.6 cm) Weight: 212 lb 8.4 oz (96.4 kg) IBW/kg (Calculated) : 59.3  Vital Signs: Temp: 99.5 F (37.5 C) (11/03 0000) Temp Source: Oral (11/03 0000) BP: 113/93 mmHg (11/03 0910) Pulse Rate: 77 (11/03 0901) Intake/Output from previous day: 11/02 0701 - 11/03 0700 In: 2165 [I.V.:880; NG/GT:1100; IV Piggyback:100] Out: -  Intake/Output from this shift: Total I/O In: 10 [I.V.:10] Out: -   Labs:  Recent Labs  04/24/15 0355 04/26/15 0415  CREATININE 0.34* 0.39*   Estimated Creatinine Clearance: 90.8 mL/min (by C-G formula based on Cr of 0.39). No results for input(s): VANCOTROUGH, VANCOPEAK, VANCORANDOM, GENTTROUGH, GENTPEAK, GENTRANDOM, TOBRATROUGH, TOBRAPEAK, TOBRARND, AMIKACINPEAK, AMIKACINTROU, AMIKACIN in the last 72 hours.   Microbiology: Recent Results (from the past 720 hour(s))  MRSA PCR Screening     Status: None   Collection Time: 04/11/15  1:58 PM  Result Value Ref Range Status   MRSA by PCR NEGATIVE NEGATIVE Final    Comment:        The GeneXpert MRSA Assay (FDA approved for NASAL specimens only), is one component of a comprehensive MRSA colonization surveillance program. It is not intended to diagnose MRSA infection nor to guide or monitor treatment for MRSA infections.   Urine culture     Status: None   Collection Time: 04/18/15  9:40 AM  Result Value Ref Range Status   Specimen Description URINE, CATHETERIZED  Final   Special Requests Normal  Final   Culture >=100,000 COLONIES/mL ESCHERICHIA COLI  Final   Report Status 04/20/2015 FINAL  Final   Organism ID, Bacteria ESCHERICHIA COLI  Final      Susceptibility   Escherichia coli - MIC*    AMPICILLIN <=2 SENSITIVE Sensitive     CEFAZOLIN <=4 SENSITIVE Sensitive      CEFTRIAXONE <=1 SENSITIVE Sensitive     CIPROFLOXACIN <=0.25 SENSITIVE Sensitive     GENTAMICIN <=1 SENSITIVE Sensitive     IMIPENEM <=0.25 SENSITIVE Sensitive     NITROFURANTOIN <=16 SENSITIVE Sensitive     TRIMETH/SULFA <=20 SENSITIVE Sensitive     AMPICILLIN/SULBACTAM <=2 SENSITIVE Sensitive     PIP/TAZO <=4 SENSITIVE Sensitive     * >=100,000 COLONIES/mL ESCHERICHIA COLI  Culture, respiratory (NON-Expectorated)     Status: None   Collection Time: 04/18/15  3:08 PM  Result Value Ref Range Status   Specimen Description TRACHEAL ASPIRATE  Final   Special Requests Normal  Final   Gram Stain   Final    MODERATE WBC PRESENT,BOTH PMN AND MONONUCLEAR RARE SQUAMOUS EPITHELIAL CELLS PRESENT RARE GRAM POSITIVE COCCI IN PAIRS IN CLUSTERS RARE GRAM NEGATIVE RODS Performed at [facility]    Culture   Final    FEW STAPHYLOCOCCUS AUREUS Note: RIFAMPIN AND GENTAMICIN SHOULD NOT BE USED AS SINGLE DRUGS FOR TREATMENT OF STAPH INFECTIONS. Performed at [facility]    Report Status 04/21/2015 FINAL  Final   Organism ID, Bacteria STAPHYLOCOCCUS AUREUS  Final      Susceptibility   Staphylococcus aureus - MIC*    CLINDAMYCIN >=8 RESISTANT Resistant     ERYTHROMYCIN >=8 RESISTANT Resistant     GENTAMICIN <=0.5 SENSITIVE Sensitive     LEVOFLOXACIN <=0.12 SENSITIVE Sensitive     OXACILLIN 0.5 SENSITIVE Sensitive  RIFAMPIN <=0.5 SENSITIVE Sensitive     TRIMETH/SULFA <=10 SENSITIVE Sensitive     VANCOMYCIN 1 SENSITIVE Sensitive     TETRACYCLINE <=1 SENSITIVE Sensitive     MOXIFLOXACIN <=0.25 SENSITIVE Sensitive     * FEW STAPHYLOCOCCUS AUREUS  Culture, respiratory (NON-Expectorated)     Status: None (Preliminary result)   Collection Time: 04/24/15 11:17 AM  Result Value Ref Range Status   Specimen Description TRACHEAL ASPIRATE  Final   Special Requests NONE  Final   Gram Stain   Final    ABUNDANT WBC PRESENT, PREDOMINANTLY PMN RARE SQUAMOUS EPITHELIAL CELLS  PRESENT MODERATE GRAM NEGATIVE COCCOBACILLI RARE GRAM POSITIVE COCCI IN PAIRS    Culture   Final    FEW CANDIDA ALBICANS Performed at [facility]    Report Status PENDING  Incomplete    Medical History: Past Medical History  Diagnosis Date  . Hypertension   . Coronary artery disease   . Diabetes mellitus   . Breast cancer ([place]) 03/10/2007    Left breat  . Degenerative disc disease, cervical   . Degenerative disc disease, lumbar   . Fibromyalgia   . Hyperlipidemia   . Carpal tunnel syndrome on right    Assessment: 84 yof admitted with stroke. Abx D7 - broadening abx today per MD for MSSA PNA for repeat culture, abundant trach secretions. Cultures are growing candida albicans at the moment - may be able to de-escalate again soon. Afeb, wbc wnl. CrCl~90.  Vanc 10/28>>10/29 CTX 10/28>>10/29 Cefazolin 10/29>>11/3 Ceftaz 11/3>>  10/26 Urine - Ecoli (pan-S) 10/26 TA - MSSA 11/1 TA - candida albicans  Goal of Therapy:  Eradication of infection  Plan:  -Broaden abx to Ceftaz 2g IV q8h -Monitor clinical progress, c/s, renal function, abx plan/LOT  Elicia Lamp, PharmD Clinical Pharmacist Pager [phone] 04/26/2015 10:31 AM

## 2015-04-27 ENCOUNTER — Inpatient Hospital Stay (HOSPITAL_COMMUNITY): Payer: 59

## 2015-04-27 ENCOUNTER — Other Ambulatory Visit (HOSPITAL_COMMUNITY): Payer: Self-pay

## 2015-04-27 ENCOUNTER — Inpatient Hospital Stay
Admission: AD | Admit: 2015-04-27 | Discharge: 2015-05-16 | Disposition: A | Discharge status: Rehabilitation Facility | Payer: Self-pay | Source: Ambulatory Visit | Attending: Internal Medicine | Admitting: Internal Medicine

## 2015-04-27 DIAGNOSIS — J189 Pneumonia, unspecified organism: Secondary | ICD-10-CM | POA: Insufficient documentation

## 2015-04-27 DIAGNOSIS — Z931 Gastrostomy status: Secondary | ICD-10-CM

## 2015-04-27 DIAGNOSIS — J9690 Respiratory failure, unspecified, unspecified whether with hypoxia or hypercapnia: Secondary | ICD-10-CM

## 2015-04-27 LAB — GLUCOSE, CAPILLARY
GLUCOSE-CAPILLARY: 120 mg/dL — AB (ref 65–99)
GLUCOSE-CAPILLARY: 134 mg/dL — AB (ref 65–99)
GLUCOSE-CAPILLARY: 141 mg/dL — AB (ref 65–99)
GLUCOSE-CAPILLARY: 143 mg/dL — AB (ref 65–99)
GLUCOSE-CAPILLARY: 171 mg/dL — AB (ref 65–99)
Glucose-Capillary: 142 mg/dL — ABNORMAL HIGH (ref 65–99)

## 2015-04-27 MED ORDER — HYDRALAZINE HCL 20 MG/ML IJ SOLN: 25.0000 mg | INTRAMUSCULAR | Status: DC | PRN

## 2015-04-27 MED ORDER — BISACODYL 10 MG RE SUPP: 10.0000 mg | Freq: Every day | RECTAL | Status: DC | PRN

## 2015-04-27 MED ORDER — PANTOPRAZOLE SODIUM 40 MG PO PACK: 40.0000 mg | PACK | ORAL | Status: DC

## 2015-04-27 MED ORDER — VANCOMYCIN HCL 10 G IV SOLR
2000.0000 mg | Freq: Once | INTRAVENOUS | Status: AC
  Administered 2015-04-27: 2000 mg via INTRAVENOUS
  Filled 2015-04-27 (×4): qty 2000

## 2015-04-27 MED ORDER — INSULIN ASPART 100 UNIT/ML ~~LOC~~ SOLN: 0.0000 [IU] | SUBCUTANEOUS | Status: DC

## 2015-04-27 MED ORDER — LISINOPRIL 20 MG PO TABS: 20.0000 mg | ORAL_TABLET | Freq: Two times a day (BID) | ORAL | Status: DC

## 2015-04-27 MED ORDER — SODIUM CHLORIDE 0.9 % IJ SOLN: 10.0000 mL | INTRAMUSCULAR | Status: DC | PRN

## 2015-04-27 MED ORDER — VANCOMYCIN HCL IN DEXTROSE 1-5 GM/200ML-% IV SOLN
1000.0000 mg | Freq: Three times a day (TID) | INTRAVENOUS | Status: DC
  Filled 2015-04-27 (×2): qty 200

## 2015-04-27 MED ORDER — POTASSIUM CHLORIDE 20 MEQ/15ML (10%) PO SOLN: 20.0000 meq | Freq: Two times a day (BID) | ORAL | Status: DC

## 2015-04-27 MED ORDER — DEXTROSE 5 % IV SOLN: 2.0000 g | Freq: Three times a day (TID) | INTRAVENOUS | Status: DC

## 2015-04-27 MED ORDER — INSULIN ASPART 100 UNIT/ML ~~LOC~~ SOLN: 3.0000 [IU] | SUBCUTANEOUS | Status: DC

## 2015-04-27 MED ORDER — IOHEXOL 300 MG/ML  SOLN
50.0000 mL | Freq: Once | INTRAMUSCULAR | Status: DC | PRN
  Administered 2015-04-27: 50 mL via ORAL

## 2015-04-27 MED ORDER — VANCOMYCIN HCL IN DEXTROSE 1-5 GM/200ML-% IV SOLN: 1000.0000 mg | Freq: Three times a day (TID) | INTRAVENOUS | Status: DC

## 2015-04-27 MED ORDER — SODIUM CHLORIDE 0.9 % IJ SOLN: 10.0000 mL | Freq: Two times a day (BID) | INTRAMUSCULAR | Status: DC

## 2015-04-27 MED ORDER — CHLORHEXIDINE GLUCONATE 0.12 % MT SOLN: 15.0000 mL | Freq: Two times a day (BID) | OROMUCOSAL | Status: DC

## 2015-04-27 MED ORDER — HYDRALAZINE HCL 50 MG PO TABS: 50.0000 mg | ORAL_TABLET | Freq: Three times a day (TID) | ORAL | Status: DC

## 2015-04-27 MED ORDER — ACETYLCYSTEINE 20 % IN SOLN: 3.0000 mL | Freq: Four times a day (QID) | RESPIRATORY_TRACT | Status: DC

## 2015-04-27 MED ORDER — ALBUTEROL SULFATE (2.5 MG/3ML) 0.083% IN NEBU: 2.5000 mg | INHALATION_SOLUTION | Freq: Four times a day (QID) | RESPIRATORY_TRACT | Status: DC

## 2015-04-27 MED ORDER — CLONIDINE HCL 0.2 MG PO TABS: 0.2000 mg | ORAL_TABLET | Freq: Two times a day (BID) | ORAL | Status: DC

## 2015-04-27 MED ORDER — WHITE PETROLATUM GEL
Status: AC
  Administered 2015-04-27: 0.2
  Filled 2015-04-27: qty 1

## 2015-04-27 MED ORDER — ACETAMINOPHEN 160 MG/5ML PO SOLN: 650.0000 mg | ORAL | Status: DC | PRN

## 2015-04-27 MED ORDER — ACETAMINOPHEN 650 MG RE SUPP: 650.0000 mg | RECTAL | Status: DC | PRN

## 2015-04-27 MED ORDER — AMLODIPINE BESYLATE 10 MG PO TABS: 10.0000 mg | ORAL_TABLET | Freq: Every day | ORAL | Status: DC

## 2015-04-27 MED ORDER — SODIUM CHLORIDE 0.9 % IV SOLN: 1000.0000 mL | INTRAVENOUS | Status: DC

## 2015-04-27 MED ORDER — CLOPIDOGREL BISULFATE 75 MG PO TABS: 75.0000 mg | ORAL_TABLET | Freq: Every day | ORAL | Status: DC

## 2015-04-27 MED ORDER — SENNOSIDES 8.8 MG/5ML PO SYRP: 5.0000 mL | ORAL_SOLUTION | Freq: Two times a day (BID) | ORAL | Status: DC | PRN

## 2015-04-27 MED ORDER — HEPARIN SODIUM (PORCINE) 5000 UNIT/ML IJ SOLN: 5000.0000 [IU] | Freq: Three times a day (TID) | INTRAMUSCULAR | Status: DC

## 2015-04-27 MED ORDER — VITAL AF 1.2 CAL PO LIQD: 1000.0000 mL | ORAL | Status: DC

## 2015-04-27 MED ORDER — CETYLPYRIDINIUM CHLORIDE 0.05 % MT LIQD: 7.0000 mL | Freq: Two times a day (BID) | OROMUCOSAL | Status: DC

## 2015-04-27 NOTE — Progress Notes (Signed)
I met with pt's spouse and son, [name], at bedside. I discussed that pt was not medically or functionally ready for intense inpt rehab yet with thick secretions needing frequent suctioning. Pt not yet mobilized out of bed since admission.  Noted antibiotic change yesterday. I discussed that LTACH had been mentioned by Dr. [name] as a possible option pending LTACH assessment and insurance approval. [name] and spouse are open to [facility] assessment as an option if CIR not yet an option. I will alert RN CM and Burnetta Sabin, [facility] for stroke service. I will continue to follow. [phone]

## 2015-04-27 NOTE — Progress Notes (Signed)
PULMONARY / CRITICAL CARE MEDICINE   Name: Kerry [name] MRN: [id number] DOB: [birth year]    ADMISSION DATE:  04/11/2015 CONSULTATION DATE:  04/11/2015  REFERRING MD : Neurology  CHIEF COMPLAINT:  Rt sided weakness  INITIAL PRESENTATION:  59 y/o female smoker presented with Rt sided weakness and difficulty with speech from LT MCA CVA s/p limited dosing of tPA and embolectomy with stent.  Remained on vent post-procedure and PCCM consulted.  STUDIES:  10/19 CT head >> Lt MCA infarct 10/20 ECHO >> EF 55 to 60%  SIGNIFICANT EVENTS: 10/19  Admit, limited tPA, embolectomy with stenting 10/20  start 3% NS 10/21  Bradycardia from propofol, fever 10/26  trach, peg, & bronch w/ BAL 10/28  started Vanc for HCAP dc 'd for mssa 10/29  ABX transitioned to ancef  11/01  Tolerating ATC  04/25/15 RN reports pt with increased tracheal secretions - yellow brown in color. Required 3 episodes of suctioning this am. Pt is able to cough and clear secretions via trach.  No respiratory distress.  On ATC    SUBJECTIVE/OVERNIGHT/INTERVAL HX  11/4 nad   VITAL SIGNS: Temp:  [98.1 F (36.7 C)-98.5 F (36.9 C)] 98.5 F (36.9 C) (11/04 1153) Pulse Rate:  [51-83] 51 (11/04 1100) Resp:  [20-37] 20 (11/04 1100) BP: (90-165)/(45-74) 116/59 mmHg (11/04 1100) SpO2:  [91 %-99 %] 99 % (11/04 1100) FiO2 (%):  [28 %] 28 % (11/04 1100) Weight:  [203 lb 14.8 oz (92.5 kg)] 203 lb 14.8 oz (92.5 kg) (11/04 0330)   HEMODYNAMICS:     VENTILATOR SETTINGS: Vent Mode:  [-]  FiO2 (%):  [28 %] 28 %   INTAKE / OUTPUT:  Intake/Output Summary (Last 24 hours) at 04/27/15 1246 Last data filed at 04/27/15 1200  Gross per 24 hour  Intake   2890 ml  Output      0 ml  Net   2890 ml    PHYSICAL EXAMINATION: General: chronically ill appearing female in NAD  Integument:  Warm & dry. No rash on exposed skin. Pink foam dressing under trach site HEENT:  No scleral injection or icterus. Trach c/d/i, sutures in  place, PERRL. right IJ in place. Cardiovascular:  Regular rate. No edema. Unable to appreciate JVD. Pulmonary:  Even/non-labored, lungs bilaterally with rhonchi Abdomen: Soft. Normal bowel sounds. Protuberant. PEG tube in place. Neurological: Follows commands at times. Spontaneously moving left lower extremity. Spontaneously moves left upper extremity.   LABS:  PULMONARY No results for input(s): PHART, PCO2ART, PO2ART, HCO3, TCO2, O2SAT in the last 168 hours.  Invalid input(s): PCO2, PO2  CBC  Recent Labs Lab 04/21/15 0357 04/22/15 0423 04/23/15 0350  HGB 9.8* 9.8* 9.6*  HCT 31.4* 32.4* 31.2*  WBC 10.5 10.1 8.8  PLT 165 194 194    COAGULATION No results for input(s): INR in the last 168 hours.  CARDIAC  No results for input(s): TROPONINI in the last 168 hours. No results for input(s): PROBNP in the last 168 hours.   CHEMISTRY  Recent Labs Lab 04/21/15 0357  04/22/15 0423  04/23/15 0350  04/24/15 0355 04/24/15 1030 04/24/15 1655 04/24/15 2150 04/25/15 0307 04/26/15 0415  NA 153*  < > 148*  < > 148*  < > 145 145 143 141 139 141  K 3.2*  --  3.2*  --  3.2*  --  3.5  --   --   --   --  3.6  CL 119*  --  115*  --  115*  --  112*  --   --   --   --  109  CO2 26  --  26  --  25  --  27  --   --   --   --  26  GLUCOSE 174*  --  224*  --  246*  --  209*  --   --   --   --  149*  BUN 17  --  14  --  11  --  10  --   --   --   --  9  CREATININE 0.49  --  0.53  --  0.42*  --  0.34*  --   --   --   --  0.39*  CALCIUM 8.5*  --  8.0*  --  7.9*  --  8.1*  --   --   --   --  8.1*  PHOS 2.8  --   --   --   --   --   --   --   --   --   --   --   < > = values in this interval not displayed. Estimated Creatinine Clearance: 88.9 mL/min (by C-G formula based on Cr of 0.39).   LIVER  Recent Labs Lab 04/21/15 0357 04/22/15 0423  AST  --  47*  ALT  --  48  ALKPHOS  --  151*  BILITOT  --  0.4  PROT  --  5.3*  ALBUMIN 1.8* 1.8*     INFECTIOUS No results for input(s):  LATICACIDVEN, PROCALCITON in the last 168 hours.   ENDOCRINE CBG (last 3)   Recent Labs  04/26/15 2338 04/27/15 0332 04/27/15 0807  GLUCAP 171* 120* 142*   Recent Results (from the past 240 hour(s))  Urine culture     Status: None   Collection Time: 04/18/15  9:40 AM  Result Value Ref Range Status   Specimen Description URINE, CATHETERIZED  Final   Special Requests Normal  Final   Culture >=100,000 COLONIES/mL ESCHERICHIA COLI  Final   Report Status 04/20/2015 FINAL  Final   Organism ID, Bacteria ESCHERICHIA COLI  Final      Susceptibility   Escherichia coli - MIC*    AMPICILLIN <=2 SENSITIVE Sensitive     CEFAZOLIN <=4 SENSITIVE Sensitive     CEFTRIAXONE <=1 SENSITIVE Sensitive     CIPROFLOXACIN <=0.25 SENSITIVE Sensitive     GENTAMICIN <=1 SENSITIVE Sensitive     IMIPENEM <=0.25 SENSITIVE Sensitive     NITROFURANTOIN <=16 SENSITIVE Sensitive     TRIMETH/SULFA <=20 SENSITIVE Sensitive     AMPICILLIN/SULBACTAM <=2 SENSITIVE Sensitive     PIP/TAZO <=4 SENSITIVE Sensitive     * >=100,000 COLONIES/mL ESCHERICHIA COLI  Culture, respiratory (NON-Expectorated)     Status: None   Collection Time: 04/18/15  3:08 PM  Result Value Ref Range Status   Specimen Description TRACHEAL ASPIRATE  Final   Special Requests Normal  Final   Gram Stain   Final    MODERATE WBC PRESENT,BOTH PMN AND MONONUCLEAR RARE SQUAMOUS EPITHELIAL CELLS PRESENT RARE GRAM POSITIVE COCCI IN PAIRS IN CLUSTERS RARE GRAM NEGATIVE RODS Performed at [facility]    Culture   Final    FEW STAPHYLOCOCCUS AUREUS Note: RIFAMPIN AND GENTAMICIN SHOULD NOT BE USED AS SINGLE DRUGS FOR TREATMENT OF STAPH INFECTIONS. Performed at [facility]    Report Status 04/21/2015 FINAL  Final   Organism ID, Bacteria STAPHYLOCOCCUS AUREUS  Final  Susceptibility   Staphylococcus aureus - MIC*    CLINDAMYCIN >=8 RESISTANT Resistant     ERYTHROMYCIN >=8 RESISTANT Resistant     GENTAMICIN <=0.5 SENSITIVE  Sensitive     LEVOFLOXACIN <=0.12 SENSITIVE Sensitive     OXACILLIN 0.5 SENSITIVE Sensitive     RIFAMPIN <=0.5 SENSITIVE Sensitive     TRIMETH/SULFA <=10 SENSITIVE Sensitive     VANCOMYCIN 1 SENSITIVE Sensitive     TETRACYCLINE <=1 SENSITIVE Sensitive     MOXIFLOXACIN <=0.25 SENSITIVE Sensitive     * FEW STAPHYLOCOCCUS AUREUS  Culture, respiratory (NON-Expectorated)     Status: None (Preliminary result)   Collection Time: 04/24/15 11:17 AM  Result Value Ref Range Status   Specimen Description TRACHEAL ASPIRATE  Final   Special Requests NONE  Final   Gram Stain   Final    ABUNDANT WBC PRESENT, PREDOMINANTLY PMN RARE SQUAMOUS EPITHELIAL CELLS PRESENT MODERATE GRAM NEGATIVE COCCOBACILLI RARE GRAM POSITIVE COCCI IN PAIRS    Culture   Final    FEW STAPHYLOCOCCUS AUREUS Note: RIFAMPIN AND GENTAMICIN SHOULD NOT BE USED AS SINGLE DRUGS FOR TREATMENT OF STAPH INFECTIONS. FEW CANDIDA ALBICANS Performed at [facility]    Report Status PENDING  Incomplete          IMAGING [phone]  - image(s) personally visualized  -   highlighted in bold Dg Chest Port 1 View  04/27/2015  CLINICAL DATA:  Chronic respiratory failure EXAM: PORTABLE CHEST 1 VIEW COMPARISON:  April 25, 2015 FINDINGS: Tracheostomy catheter tip is 4.0 cm above the carina. Central catheter tip is in the superior vena cava. No pneumothorax. There is patchy bibasilar atelectasis with slight interstitial edema. There is a small right effusion. Lungs elsewhere clear. Heart is mildly enlarged with pulmonary vascularity within normal limits. No adenopathy. IMPRESSION: Tube and catheter positions as described without pneumothorax. Evidence of persistent congestive heart failure with slight edema and small right effusion. Cardiomegaly is stable. No new opacity. There is patchy bibasilar atelectasis. Electronically Signed   By: Lowella Grip III M.D.   On: 04/27/2015 07:48     ASSESSMENT / PLAN: NEUROLOGIC A:   Lt MCA  infarct - s/p limited tPA and embolectomy with stenting Cytotoxic edema P:    management per Neurology   PULMONARY ETT 10/19 >> 10/26 Percutaneous Trach 10/26>> A: Compromised airway in setting of CVA MSSA RLL HCAP - BAL from 10/26 with MSSA, repeat 11/1 pending but GM stain with possible gm negative and cxr worsening 06/24/14   P:   Follow sputum culture from 11/1  Albuterol neb q6hr for secretions ATC as tolerated  Pulmonary hygiene as tolerated - mobilize per PT Will follow for trach management Lasix daily Ceftaz starting 04/26/2015   INFECTIOUS A:   HCAP - MSSA 04/18/15 and 04/21/15 E coli UTI 03/2615 P: BAL (10/26)>> Staph aureus >> MSSA (10/26) Urine Ctx (10/26) - E coli (pan-sens)  Vancomycin 10/28>> 10/29 Rocephin 1gm IV q24hr for UTI 10/28>> 10/29 Ancef 10/29 >> ? Stop date Ceftaz 04/26/15 >  FAMILY DISCUSSION:  No one at bedside  Global: PCCM will see again on Monday.  Richardson Landry Naelani Lafrance ACNP Maryanna Shape PCCM Pager [phone] till 3 pm If no answer page [phone] 04/27/2015, 12:46 PM

## 2015-04-27 NOTE — Discharge Summary (Signed)
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[place])   Stroke (cerebrum) ([place])   Cerebrovascular accident (CVA) due to occlusion of left middle cerebral artery ([place])   Cerebrovascular accident (CVA) due to thrombosis of precerebral artery ([place])   Encounter for central line placement   Encounter for feeding tube placement   Sinus pause   Sinus bradycardia   Asystole ([place])   Acute respiratory failure ([place])   Cerebral hemorrhage ([place])   Cytotoxic cerebral edema ([place])   Compromised airway   HLD (hyperlipidemia)   Type 2 diabetes mellitus with circulatory disorder ([place])   Hypokalemia   Respiratory failure ([place])   Chronic respiratory failure ([place])   MSSA Pneumonia  BMI: Body mass index is 32.93 kg/(m^2).  Past Medical History  Diagnosis Date  . Hypertension   . Coronary artery disease   . Diabetes mellitus   . Breast cancer ([place]) 03/10/2007    Left breat  . Degenerative disc disease, cervical   . Degenerative disc disease, lumbar   . Fibromyalgia   . Hyperlipidemia   . Carpal tunnel syndrome on right    Past Surgical History  Procedure Laterality Date  . Cardiac surgery    . Appendectomy    . Breast surgery    . Mastectomy Left 03/2007  . Cardiac catheterization  2005  . Vein surgery Right 2005  . Tubal ligation Right   . Radiology with anesthesia N/A 04/11/2015    Procedure: RADIOLOGY WITH ANESTHESIA;  Surgeon:  Luanne Bras, MD;  Location: [facility];  Service: Radiology;  Laterality: N/A;  . Cardiac catheterization N/A 04/13/2015    Procedure: Temporary Pacemaker;  Surgeon: Peter M Martinique, MD;  Location: [facility];  Service: Cardiovascular;  Laterality: N/A;  . Peg placement N/A 04/18/2015    Procedure: PERCUTANEOUS ENDOSCOPIC GASTROSTOMY (PEG) PLACEMENT;  Surgeon: Judeth Horn, MD;  Location: [facility];  Service: General;  Laterality: N/A;  bedside  . Esophagogastroduodenoscopy N/A 04/18/2015    Procedure: ESOPHAGOGASTRODUODENOSCOPY (EGD);  Surgeon: Judeth Horn, MD;  Location: [facility] ENDOSCOPY;  Service: General;  Laterality: N/A;      Medication List    STOP taking these medications        glipiZIDE 10 MG 24 hr tablet  Commonly known as:  GLUCOTROL XL     LORazepam 1 MG tablet  Commonly known as:  ATIVAN     metoprolol succinate 50 MG 24 hr tablet  Commonly known as:  TOPROL-XL     oxyCODONE 5 MG immediate release tablet  Commonly known as:  Oxy IR/ROXICODONE     oxyCODONE-acetaminophen 5-325 MG tablet  Commonly known as:  PERCOCET     PARoxetine 20 MG tablet  Commonly known as:  PAXIL     SUMAtriptan 100 MG tablet  Commonly known as:  IMITREX      TAKE these medications        acetaminophen 160 MG/5ML solution  Commonly known as:  TYLENOL  Place  20.3 mLs (650 mg total) into feeding tube every 4 (four) hours as needed for mild pain, headache or fever.     acetaminophen 650 MG suppository  Commonly known as:  TYLENOL  Place 1 suppository (650 mg total) rectally every 4 (four) hours as needed for mild pain (temp >/= 99.5).     acetylcysteine 20 % nebulizer solution  Commonly known as:  MUCOMYST  Take 3 mLs by nebulization every 6 (six) hours.     albuterol (2.5 MG/3ML) 0.083% nebulizer solution  Commonly known as:  PROVENTIL  Take 3 mLs (2.5 mg total) by nebulization every 6 (six) hours.     amLODipine 10 MG tablet  Commonly known as:  NORVASC  Take 1 tablet  (10 mg total) by mouth daily.     antiseptic oral rinse 0.05 % Liqd solution  Commonly known as:  CPC / CETYLPYRIDINIUM CHLORIDE 0.05%  7 mLs by Mouth Rinse route 2 times daily at 12 noon and 4 pm.     bisacodyl 10 MG suppository  Commonly known as:  DULCOLAX  Place 1 suppository (10 mg total) rectally daily as needed for moderate constipation.     cefTAZidime 2 g in dextrose 5 % 50 mL  Inject 2 g into the vein every 8 (eight) hours.     chlorhexidine 0.12 % solution  Commonly known as:  PERIDEX  15 mLs by Mouth Rinse route 2 (two) times daily.     cloNIDine 0.2 MG tablet  Commonly known as:  CATAPRES  Place 1 tablet (0.2 mg total) into feeding tube 2 (two) times daily.     clopidogrel 75 MG tablet  Commonly known as:  PLAVIX  Take 1 tablet (75 mg total) by mouth daily.     feeding supplement (VITAL AF 1.2 CAL) Liqd  Place 1,000 mLs into feeding tube continuous.     heparin 5000 UNIT/ML injection  Inject 1 mL (5,000 Units total) into the skin every 8 (eight) hours.     hydrALAZINE 20 MG/ML injection  Commonly known as:  APRESOLINE  Inject 1.25 mLs (25 mg total) into the vein every 4 (four) hours as needed (SBP > 160).     hydrALAZINE 50 MG tablet  Commonly known as:  APRESOLINE  Take 1 tablet (50 mg total) by mouth every 8 (eight) hours.     insulin aspart 100 UNIT/ML injection  Commonly known as:  novoLOG  Inject 0-20 Units into the skin every 4 (four) hours.     insulin aspart 100 UNIT/ML injection  Commonly known as:  novoLOG  Inject 3 Units into the skin every 4 (four) hours.     insulin glargine 100 UNIT/ML injection  Commonly known as:  LANTUS  Inject 30 Units into the skin at bedtime.     lisinopril 20 MG tablet  Commonly known as:  PRINIVIL,ZESTRIL  Take 1 tablet (20 mg total) by mouth 2 (two) times daily.     pantoprazole sodium 40 mg/20 mL Pack  Commonly known as:  PROTONIX  Place 20 mLs (40 mg total) into feeding tube daily.     potassium chloride  20 MEQ/15ML (10%) Soln  Place 15 mLs (20 mEq total) into feeding tube 2 (two) times daily.     pravastatin 40 MG tablet  Commonly known as:  PRAVACHOL  Take 40 mg by mouth daily.     sennosides 8.8 MG/5ML syrup  Commonly known as:  SENOKOT  Place 5 mLs into feeding tube 2 (two) times  daily as needed for mild constipation.     sodium chloride 0.9 % infusion  Inject 1,000 mLs into the vein continuous.     sodium chloride 0.9 % injection  10-40 mLs by Intracatheter route every 12 (twelve) hours.     sodium chloride 0.9 % injection  10-40 mLs by Intracatheter route as needed (flush).     vancomycin 1 GM/200ML Soln  Commonly known as:  VANCOCIN  Inject 200 mLs (1,000 mg total) into the vein every 8 (eight) hours.  Start taking on:  04/28/2015        LABORATORY STUDIES CBC    Component Value Date/Time   WBC 8.8 04/23/2015 0350   WBC 10.6* 12/17/2011 1528   RBC 3.39* 04/23/2015 0350   RBC 5.02 12/17/2011 1528   HGB 9.6* 04/23/2015 0350   HGB 14.6 12/17/2011 1528   HCT 31.2* 04/23/2015 0350   HCT 43.2 12/17/2011 1528   PLT 194 04/23/2015 0350   PLT 239 12/17/2011 1528   MCV 92.0 04/23/2015 0350   MCV 86.0 12/17/2011 1528   MCH 28.3 04/23/2015 0350   MCH 29.0 12/17/2011 1528   MCHC 30.8 04/23/2015 0350   MCHC 33.7 12/17/2011 1528   RDW 13.4 04/23/2015 0350   RDW 13.8 12/17/2011 1528   LYMPHSABS 2.4 04/23/2015 0350   LYMPHSABS 3.0 12/17/2011 1528   MONOABS 0.4 04/23/2015 0350   MONOABS 0.5 12/17/2011 1528   EOSABS 0.2 04/23/2015 0350   EOSABS 0.4 12/17/2011 1528   BASOSABS 0.0 04/23/2015 0350   BASOSABS 0.1 12/17/2011 1528   CMP    Component Value Date/Time   NA 141 04/26/2015 0415   K 3.6 04/26/2015 0415   CL 109 04/26/2015 0415   CO2 26 04/26/2015 0415   GLUCOSE 149* 04/26/2015 0415   BUN 9 04/26/2015 0415   CREATININE 0.39* 04/26/2015 0415   CALCIUM 8.1* 04/26/2015 0415   PROT 5.3* 04/22/2015 0423   ALBUMIN 1.8* 04/22/2015 0423   AST 47* 04/22/2015 0423    ALT 48 04/22/2015 0423   ALKPHOS 151* 04/22/2015 0423   BILITOT 0.4 04/22/2015 0423   GFRNONAA >60 04/26/2015 0415   GFRAA >60 04/26/2015 0415   COAGS Lab Results  Component Value Date   INR 1.00 04/11/2015   Lipid Panel    Component Value Date/Time   CHOL 208* 04/12/2015 0315   TRIG 231* 04/12/2015 0315   HDL 29* 04/12/2015 0315   CHOLHDL 7.2 04/12/2015 0315   VLDL 46* 04/12/2015 0315   LDLCALC 133* 04/12/2015 0315   HgbA1C  Lab Results  Component Value Date   HGBA1C 11.4* 04/12/2015   Cardiac Panel (last 3 results) No results for input(s): CKTOTAL, CKMB, TROPONINI, RELINDX in the last 72 hours. Urinalysis    Component Value Date/Time   COLORURINE YELLOW 04/17/2015 1200   APPEARANCEUR CLOUDY* 04/17/2015 1200   LABSPEC 1.026 04/17/2015 1200   PHURINE 6.0 04/17/2015 1200   GLUCOSEU 500* 04/17/2015 1200   HGBUR TRACE* 04/17/2015 1200   BILIRUBINUR NEGATIVE 04/17/2015 1200   KETONESUR NEGATIVE 04/17/2015 1200   PROTEINUR NEGATIVE 04/17/2015 1200   UROBILINOGEN 2.0* 04/17/2015 1200   NITRITE POSITIVE* 04/17/2015 1200   LEUKOCYTESUR SMALL* 04/17/2015 1200   Urine Drug Screen No results found for: LABOPIA, COCAINSCRNUR, LABBENZ, AMPHETMU, THCU, LABBARB  Alcohol Level No results found for: [facility]   SIGNIFICANT DIAGNOSTIC STUDIES  Ct Head Wo Contrast  04/17/2015 1. Mild interval evolution of large left MCA territory infarct, with associated edema. Slight rightward midline shift, measuring 2-3  mm, with partial effacement of the left lateral ventricle. Scattered foci of associated hemorrhage are perhaps slightly more diffuse than on the prior study. 2. Mild small vessel ischemic microangiopathy.  04/12/2105 1. Large acute left MCA territory infarct with resolving associated hemorrhage. Similar local mass effect without midline shift. 2. No new intracranial process.  04/12/2015 Acute large LEFT MCA territory infarct with similar patchy areas of hemorrhage. Local mass  effect without midline shift. Mildly dense intracranial vessels most compatible with residual contrast.   04/11/2015 1355 Acute hemorrhage in the left temporal lobe. This may be within the sylvian fissure rather than the insular cortex. There are small gas bubbles in the area of hemorrhage which may be extra vascular gas in the subarachnoid space. This is likely related to the procedure and micro perforation of a vessel. Acute infarct left temporal lobe and possibly left basal ganglia. There is local edema without midline shift   10/19/20161052 Slightly more conspicuous loss of gray-white differentiation affecting the left temporal lobe and frontoparietal regions in the left basal ganglia consistent with acute left MCA infarction. Aspects score difficult because of some motion, but estimated at 5 or 6.   04/11/2015 0929 CT findings consistent with early left MCA infarct with a dense left MCA sign. No mass effect or hemorrhage.   Cerebral Angiogram 04/11/2015 1257 Left mca stroke due to atherosclerotic occlusion of left ICA with embolism to left mca. Treated with embolectomy and stent placement. TICI 2A flow. There are multiple distal emboli.  CT chest without contrast 04/20/2015 1. Consolidation at the RIGHT lung base suggest pneumonia.  Recommend follow-up contrast chest CT to exclude underlying mass lesion. 2. Small bilateral pleural effusions. 3. No pulmonary edema  2D Echocardiogram  - Left ventricle: The cavity size was normal. Systolic function wasnormal. The estimated ejection fraction was in the range of 55%to 60%. Wall motion was normal; there were no regional wallmotion abnormalities. - Aortic valve: Valve area (VTI): 2.24 cm^2. Valve area (Vmax): 2cm^2. Valve area (Vmean): 2.04 cm^2. - Atrial septum: No defect or patent foramen ovale was identified.   Last CXR 04/27/2015 Tube and catheter positions as described without pneumothorax. Evidence of persistent  congestive heart failure with slight edema and small right effusion. Cardiomegaly is stable. No new opacity. There is patchy bibasilar atelectasis.     HISTORY OF PRESENT ILLNESS  BLAKELEE ALLINGTON is an 59 y.o. female with a history of diabetes mellitus, hypertension and hyperlipidemia brought to the emergency room following acute onset of speech difficulty as well as right facial and extremity weakness (LKW 04/11/2015 0630). Patient has no previous history of stroke nor TIA. She has not been on antiplatelet therapy. CT scan of her head showed no signs of acute hemorrhage. Dense left MCA was noted, indicative of acute thrombus. Patient was deemed a candidate for TPA which was started 04/11/2015 at 0930. She had repeated problems with hypertension requiring intervention with IV labetalol as well as hydralazine. Complete dose of TPA was not administered due to BP control. Patient was taken to interventional radiology for further management following a repeat CT scan of her head to rule out intracranial hemorrhage after worsening of right hemiparesis and development of lethargy.  Repeat study showed no signs of acute intracranial hemorrhage. Study did show early signs of possible MCA territory stroke involving left temporal and frontoparietal regions as well as left basal ganglia area. Puncture time 1110. She received a L carotid stent and embolectomy with TICI 2A revascularization. She was admitted  to the neuro ICU for further evaluation and treatment.   HOSPITAL COURSE Ms. DELISHA PEADEN is a 59 y.o. female with history of diabetes mellitus, hypertension, hyperlipidemia and fibromyalgia presenting with new onset speech difficulty and right hemiparesis. She received IV t-PA (total 50 mg, 19 mg not received) due to elevated BP. Taken to IR where she received a carotid stent and embolectomy with TICI 2A revascularization  Stroke: Dominant left MCA infarct due to embolization from proximal left internal  carotid artery occlusion s/p IV tPA, proximal left internal carotid stent, mechanical embolectomy with resultant TICI 2A flow with multiple persistent distal emboli.   Resultant Respiratory failure, right hemiparesis, cerebral edema, dysphagia, global aphasia  Repeat CT head large L MCA infarct, mild midline shift following procedure. CT with contrast staining, ruled out hemorrhagic transformation due to quick resolution.   2D Echo EF 55-60%, no source of embolus  LDL 133  HgbA1c 11.4   No antithrombotic prior to admission. In hospital post stent, started on Plavix 75 mg daily  Ongoing aggressive stroke risk factor management  PEG and trach done on 04/18/15  Therapy recommendations: CIR - they are following for medical stability. Respiratory status remains tenuous, CIR unable to take. LTACH referral placed. Able to accept in transfer. Rehab discussed with family, who is agreeable. CM involved and facilitated transfer.   Disposition: LTACH  Cytotoxic cerebral edema Hypernatremia, induced  Repeat CT showed mild midline shift  Started 3% saline 04/13/2015, off 04/16/15  Na 159 -> 157 -> 155 ->159 -> 154 -> 149  Stable  Carotid stenosis  L ICA occlusion  S/p acute stent placement (Dr. Arizona Constable at [facility])  Switched from ASA to plavix for carotid stenting after procedure  Acute respiratory Failure 1. Intubated for procedure 10/19 ->10/26 2. Unable to protect airway 3. S/p trach 10/26 4. Thick secretions requiring frequent suctioning 5. PCCM saw yesterday and adjusted regimen 6. [place]ir valve training begun  MSSA RLL HCAP  BAL from 10/26 with MSSA  repeat 11/1 pending but GM stain with possible gm negative and cxr worsening 06/24/14  Add mucomyst x 36h from 06/25/14  Albuterol neb q6hr for secretions  ATC as tolerated   Pulmonary hygiene as tolerated - mobilize per PT  Lasix daily  Ceftaz starting 04/26/2015  Vanc starting 11/4  Hypertensive  Emergency  BP 179/82 on arrival, as high as 213/97  Treated with cardene drip, now off   Increased lisinopril to 20mg  bid  BP improving  labetolol and hydralazine IV prn  Dysphagia  Secondary to stroke  PEG placed 10/26  Hyperlipidemia 1. Home meds: pravachol 40 2. LDL 133, goal < 70 3. Resumed home meds 4. Continue statin at discharge  Bradycardia - likely due to carotid stent placement    Marked sinus bradycardia with pauses > 4 seconds/ asystole  Down to the 30s both pre-IR and post CL placement  BB not recommended  Cardiology has seen, external pacemaker placed and then removed 04/16/15  HR stabilized  Diabetes type II  HgbA1c 11.4 , goal < 7.0  On lantus 30 u daily PTA  Once started on tube feeding, resumed lantus 30 unit.  SSI  CBG monitoring  Other Stroke Risk Factors  Cigarette smoker  ETOH use  Obesity, Body mass index is 32.93 kg/(m^2).   Coronary artery disease  Noncompliance   PAD  L femoral bypass  Other Active Problems  Hx breast cancer  AKI - Cr 0.5  Hypokalemia, treated, resolved  Anemia, trending  down, monitor   E coli UTI 03/2615  Urine Ctx (10/26) - E coli (pan-sens)  Vancomycin 10/28>> 10/29  Rocephin 1gm IV q24hr for UTI 10/28>> 10/29  Ancef 10/29 >> ? Stop date  Ceftaz 04/26/15 >   DISCHARGE EXAM Blood pressure 114/53, pulse 62, temperature 99.9 F (37.7 C), temperature source Axillary, resp. rate 31, height 5\' 6"  (1.676 m), weight 92.5 kg (203 lb 14.8 oz), SpO2 96 %. General: The patient is s/p trach, drowsy, open eyes on pain stimulation, tracking but not following simple commands. Off fentanyl. HEENT: NCAT; Sclera clear; intubated Respiratory: Clear lung fields Cardiovascular: Regular rate and rhythm Abdomen: Soft, nontender, positive bowel sounds Skin: 1+ edema in the legs noted.. Neurologic Exam Drowsy but awakens, Eyes open. tracking with eyes but not following simple commands mainly on  left side. Both eyes conjugated and tracking objects on the left, pupil left 3.32mm and right 44mm, both sluggish to light, difference remained same at light or dark. Doll's eyes present. Right facial droop. Positive corneal and gag. Left UE localized to pain, right UE 0/5 on pain. Left LE against gravity, RLE 2/5 on pain stimulation. Reflex 1+ and right babinski positive.  Discharge Diet   Diet NPO time specified liquids  DISCHARGE PLAN  Disposition:  LTACH   clopidogrel 75 mg daily for secondary stroke prevention.  Follow-up DONDIEGO,RICHARD M, MD in 2 weeks.  Follow-up with Dr. Rosalin Hawking, Stroke Clinic in 2 months.  90 minutes were spent preparing discharge.  [place] for Pager information 04/27/2015 6:26 PM  I have personally examined this patient, reviewed notes, independently viewed imaging studies, participated in medical decision making and plan of care. I have made any additions or clarifications directly to the above note. Agree with note above.    Antony Contras, MD Medical Director [facility] Pager: [phone] 04/27/2015 6:43 PM

## 2015-04-27 NOTE — Progress Notes (Signed)
Physical Therapy Treatment Patient Details Name: Kerry [name] MRN: [id number] DOB: 17-Dec-1955 Today's Date: 04/27/2015    History of Present Illness pt presents with dense L MCA Infarct involving L Temporal, Frontoparietal, and L Basal Ganglia regions and was started on TPA, but unable to complete full dose due to hypertension, but did recieve IR intervention. Pt extubated 10/30. pt with hx of DM, HTN, Breast CA s/p Mastectomy, and Fibromyalgia.  10/26 trach/peg.    PT Comments    Patient progressing more with sitting balance.  Still not able to maintain head control in sitting for long, but with proper motivation (i.e. Family member in room,) she elevates her head voluntarily.  Patient may benefit from [facility] for rehab and ongoing medical needs.  Follow Up Recommendations  LTACH     Equipment Recommendations  Other (comment) (TBD)    Recommendations for Other Services       Precautions / Restrictions Precautions Precautions: Fall Precaution Comments: trach/peg    Mobility  Bed Mobility Overal bed mobility: +2 for physical assistance;Needs Assistance Bed Mobility: Rolling;Sidelying to Sit Rolling: +2 for physical assistance;Mod assist Sidelying to sit: +2 for physical assistance;Max assist     Sit to sidelying: Max assist;+2 for physical assistance General bed mobility comments: hand over hand for pt to reach with left UE to rail to assist with rolling and side to sit;   Transfers Overall transfer level: Needs assistance   Transfers: Sit to/from Stand Sit to Stand: Total assist         General transfer comment: attempted x 2 with pad under pt and +2 A to initiate coming up to stand without Pt. initiating   Ambulation/Gait                 Stairs            Wheelchair Mobility    Modified Rankin (Stroke Patients Only) Modified Rankin (Stroke Patients Only) Pre-Morbid Rankin Score: No symptoms Modified Rankin: Severe disability      Balance   Sitting-balance support: Feet supported;No upper extremity supported Sitting balance-Leahy Scale: Poor Sitting balance - Comments: min A through about 50% of session for sitting balance (about 8 min sitting); attempted to have pt lift head in sitting for attention to this theapist, to number of fingers with limited head elevation even after PROM head and neck.  Then pt's son looked in room and pt lifted head voluntarily                             Cognition Arousal/Alertness: Awake/alert Behavior During Therapy: Flat affect Overall Cognitive Status: Impaired/Different from baseline Area of Impairment: Attention;Following commands;Awareness;Problem solving   Current Attention Level: Sustained   Following Commands: Follows one step commands inconsistently;Follows one step commands with increased time     Problem Solving: Decreased initiation;Slow processing;Requires verbal cues;Requires tactile cues;Difficulty sequencing      Exercises      General Comments        Pertinent Vitals/Pain Faces Pain Scale: No hurt    Home Living                      Prior Function            PT Goals (current goals can now be found in the care plan section) Progress towards PT goals: Progressing toward goals    Frequency  Min 3X/week    PT Plan Discharge plan needs  to be updated    Co-evaluation             End of Session Equipment Utilized During Treatment: Oxygen Activity Tolerance: Patient limited by fatigue Patient left: in bed;with call bell/phone within reach;with restraints reapplied     Time: 9574-7340 PT Time Calculation (min) (ACUTE ONLY): 24 min  Charges:  $Therapeutic Activity: 23-37 mins                    G Codes:      Jameila Keeny,CYNDI 05/15/2015, 1:04 PM  Magda Kiel, [place] 05-15-2015

## 2015-04-27 NOTE — Progress Notes (Signed)
Pt discharged to [facility] per MD order. Report called to Inez Catalina, RN, all questions answered. Care taken over by [facility]. Family notified of patient's disposition.

## 2015-04-27 NOTE — Progress Notes (Signed)
ANTIBIOTIC CONSULT NOTE - FOLLOW UP  Pharmacy Consult for Vancomycin Indication: pneumonia  Allergies  Allergen Reactions  . Oxycodone     Sick   . Oxycontin [Oxycodone Hcl]     Sick     Patient Measurements: Height: 5\' 6"  (167.6 cm) Weight: 203 lb 14.8 oz (92.5 kg) IBW/kg (Calculated) : 59.3   Vital Signs: Temp: 99.9 F (37.7 C) (11/04 1432) Temp Source: Axillary (11/04 1432) BP: 114/53 mmHg (11/04 1456) Pulse Rate: 55 (11/04 1432) Intake/Output from previous day: 11/03 0701 - 11/04 0700 In: 2360 [I.V.:1010; NG/GT:1250; IV Piggyback:100] Out: -  Intake/Output from this shift: Total I/O In: 720 [I.V.:280; Other:90; NG/GT:350] Out: -   Labs:  Recent Labs  04/26/15 0415  CREATININE 0.39*   Estimated Creatinine Clearance: 88.9 mL/min (by C-G formula based on Cr of 0.39).  Assessment: 58yof continues on day #8 antibiotics, currently ceftazidime, for MSSA PNA and Ecoli UTI. Tracheal aspirate culture from 11/1 now growing few staph aureus, sensitivities pending. Vancomycin will be added back until culture results. Renal function stable.  Vancomycin 10/28>>10/29, resume 11/4>> CTX 10/28>>10/29 Cefazolin 10/29>>11/3 Ceftazidime 11/3>>  10/26 Urine - Ecoli (pan-S) 10/26 TA - MSSA 11/1 TA - few candida albicans, few staph aureus  Goal of Therapy:  Vancomycin trough level 15-20 mcg/ml  Plan:  1) Vancomycin 2g IV x 1 then 1g IV q8 2) Follow up culture results  Deboraha Sprang 04/27/2015,3:04 PM

## 2015-04-27 NOTE — Progress Notes (Signed)
STROKE TEAM PROGRESS NOTE   SUBJECTIVE (INTERVAL HISTORY) Patient continues to have thick secretions. RN reports respiratory suctioning 4 times during the night.   OBJECTIVE Temp:  [98.1 F (36.7 C)-99.1 F (37.3 C)] 98.1 F (36.7 C) (11/04 0735) Pulse Rate:  [54-83] 57 (11/04 0735) Cardiac Rhythm:  [-] Sinus bradycardia (11/04 0735) Resp:  [21-37] 22 (11/04 0735) BP: (90-165)/(45-93) 121/54 mmHg (11/04 0735) SpO2:  [91 %-99 %] 96 % (11/04 0735) FiO2 (%):  [28 %] 28 % (11/04 0735) Weight:  [92.5 kg (203 lb 14.8 oz)] 92.5 kg (203 lb 14.8 oz) (11/04 0330)  CBC:   Recent Labs Lab 04/22/15 0423 04/23/15 0350  WBC 10.1 8.8  NEUTROABS 7.3 5.8  HGB 9.8* 9.6*  HCT 32.4* 31.2*  MCV 93.1 92.0  PLT 194 188   Basic Metabolic Panel:   Recent Labs Lab 04/21/15 0357  04/24/15 0355  04/25/15 0307 04/26/15 0415  NA 153*  < > 145  < > 139 141  K 3.2*  < > 3.5  --   --  3.6  CL 119*  < > 112*  --   --  109  CO2 26  < > 27  --   --  26  GLUCOSE 174*  < > 209*  --   --  149*  BUN 17  < > 10  --   --  9  CREATININE 0.49  < > 0.34*  --   --  0.39*  CALCIUM 8.5*  < > 8.1*  --   --  8.1*  PHOS 2.8  --   --   --   --   --   < > = values in this interval not displayed.  Lipid Panel:     Component Value Date/Time   CHOL 208* 04/12/2015 0315   TRIG 231* 04/12/2015 0315   HDL 29* 04/12/2015 0315   CHOLHDL 7.2 04/12/2015 0315   VLDL 46* 04/12/2015 0315   LDLCALC 133* 04/12/2015 0315   HgbA1c:  Lab Results  Component Value Date   HGBA1C 11.4* 04/12/2015    Physical Exam General: The patient is s/p trach, drowsy, open eyes on pain stimulation, tracking but not following simple commands. Off fentanyl. HEENT:  NCAT; Sclera clear; intubated Respiratory: Clear lung fields Cardiovascular: Regular rate and rhythm Abdomen: Soft, nontender, positive bowel sounds Skin: 1+ edema in the legs noted.. Neurologic Exam Drowsy but awakens,  Eyes open. tracking with eyes but not following  simple commands mainly on left side. Both eyes conjugated and tracking objects on the left, pupil left 3.16mm and right 66mm, both sluggish to light, difference remained same at light or dark. Doll's eyes present. Right facial droop. Positive corneal and gag. Left UE localized to pain, right UE 0/5 on pain. Left LE against gravity, RLE 2/5 on pain stimulation. Reflex 1+ and right babinski positive.   ASSESSMENT/PLAN [name] is a 59 y.o. female with history of diabetes mellitus, hypertension, hyperlipidemia and fibromyalgia presenting with new onset speech difficulty and right hemiparesis. She received IV t-PA (total 50 mg, 19 mg not received) due to elevated BP. 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   Resultant  Respiratory failure, right hemiparesis, cerebral edema, dysphagia  Repeat CT head large L MCA infarct, mild midline shift  2D Echo  EF 55-60%, no source of embolus  LDL 133  HgbA1c 11.4    VTE prophylaxis - subcutaneous heparin Diet NPO time specified  No antithrombotic prior to admission.now on Plavix 75 mg daily  Ongoing aggressive stroke risk factor management  PEG and trach done on 04/18/15  Therapy recommendations:  CIR - they are following for medical stability. Have discussed LTACH with CM. Dr. [name] has not addressed with family.  Disposition:  pending   Cytotoxic cerebral edema  Repeat CT showed mild midline shift  Started 3% saline 04/13/2015, off 04/16/15  Na 159 -> 157 -> 155 ->159 -> 154 -> 149  Stable   Carotid stenosis  L ICA occlusion  S/p acute stent placement (Dr. [place] Constable at [facility])  Switched from ASA to plavix for carotid stenting  after procedure  Acute respiratory Failure  Intubated for procedure  Unable to protect airway  S/p trach  Thick secretions requiring frequent suctioning  PCCM saw yesterday and adjusted regimen  Pas[place] Muir valve training begun  Malignant Hypertension  BP 179/82 on arrival, as high as 213/97  Treated with cardene drip, now off   Increased lisinopril to 20mg  bid  BP improving  Hyperlipidemia  Home meds:  pravachol 40  LDL 133, goal < 70  Resumed home meds  Continue statin at discharge  Bradycardia - likely due to carotid stent placement  Down to the 58s both pre-IR and post CL placement  BB not recommended  Cardiology has seen, external pacemaker placed and then removed 04/16/15  HR stabilized   Diabetes type II  HgbA1c 11.4 , goal < 7.0  On lantus 30 u daily PTA, on tube feeding, resume lantus 30 unit.  SSI  CBG monitoring  Other Stroke Risk Factors  Cigarette smoker, quit smoking  ETOH use  Obesity, Body mass index is 32.93 kg/(m^2).   Coronary artery disease  Noncompliance   L femoral bypass  Other Active Problems  Hx breast cancer  AKI - Cr 0.5  Traceobronchitis- will add [facility] day # [place] for Pager information 04/27/2015 8:36 AM    I have personally examined this patient, reviewed notes, independently viewed imaging studies, participated in medical decision making and plan of care. I have made any additions or clarifications directly to the above note. Agree with note above.  Antony Contras, MD Medical Director [facility] Pager: [phone] 04/27/2015 6:26 PM To contact Stroke Continuity provider, please refer to [email or web address] After hours, contact General Neurology

## 2015-04-28 LAB — CBC WITH DIFFERENTIAL/PLATELET
BASOS ABS: 0 10*3/uL (ref 0.0–0.1)
Basophils Relative: 0 %
EOS PCT: 3 %
Eosinophils Absolute: 0.2 10*3/uL (ref 0.0–0.7)
HCT: 30.4 % — ABNORMAL LOW (ref 36.0–46.0)
HEMOGLOBIN: 9.5 g/dL — AB (ref 12.0–15.0)
LYMPHS PCT: 23 %
Lymphs Abs: 1.7 10*3/uL (ref 0.7–4.0)
MCH: 28.3 pg (ref 26.0–34.0)
MCHC: 31.3 g/dL (ref 30.0–36.0)
MCV: 90.5 fL (ref 78.0–100.0)
Monocytes Absolute: 0.5 10*3/uL (ref 0.1–1.0)
Monocytes Relative: 7 %
NEUTROS PCT: 67 %
Neutro Abs: 4.9 10*3/uL (ref 1.7–7.7)
PLATELETS: 245 10*3/uL (ref 150–400)
RBC: 3.36 MIL/uL — AB (ref 3.87–5.11)
RDW: 13.8 % (ref 11.5–15.5)
WBC: 7.3 10*3/uL (ref 4.0–10.5)

## 2015-04-28 LAB — COMPREHENSIVE METABOLIC PANEL
ALT: 21 U/L (ref 14–54)
AST: 23 U/L (ref 15–41)
Albumin: 2.2 g/dL — ABNORMAL LOW (ref 3.5–5.0)
Alkaline Phosphatase: 96 U/L (ref 38–126)
Anion gap: 9 (ref 5–15)
BUN: 9 mg/dL (ref 6–20)
CHLORIDE: 105 mmol/L (ref 101–111)
CO2: 24 mmol/L (ref 22–32)
CREATININE: 0.41 mg/dL — AB (ref 0.44–1.00)
Calcium: 8.3 mg/dL — ABNORMAL LOW (ref 8.9–10.3)
GFR calc Af Amer: >60 mL/min (ref >60)
Glucose, Bld: 173 mg/dL — ABNORMAL HIGH (ref 65–99)
Potassium: 3.9 mmol/L (ref 3.5–5.1)
SODIUM: 138 mmol/L (ref 135–145)
Total Bilirubin: 0.3 mg/dL (ref 0.3–1.2)
Total Protein: 5.7 g/dL — ABNORMAL LOW (ref 6.5–8.1)

## 2015-04-28 LAB — CULTURE, RESPIRATORY W GRAM STAIN

## 2015-04-28 LAB — CULTURE, RESPIRATORY

## 2015-04-28 LAB — TSH: TSH: 1.108 u[IU]/mL (ref 0.350–4.500)

## 2015-04-28 LAB — MAGNESIUM: Magnesium: 1.9 mg/dL (ref 1.7–2.4)

## 2015-04-28 LAB — PROCALCITONIN

## 2015-04-28 LAB — C-REACTIVE PROTEIN: CRP: 2.1 mg/dL — ABNORMAL HIGH (ref <1.0)

## 2015-04-28 LAB — VANCOMYCIN, RANDOM: Vancomycin Rm: 13 ug/mL

## 2015-04-28 LAB — T4, FREE: FREE T4: 1.21 ng/dL — AB (ref 0.61–1.12)

## 2015-04-28 LAB — PHOSPHORUS: Phosphorus: 3.9 mg/dL (ref 2.5–4.6)

## 2015-04-29 ENCOUNTER — Other Ambulatory Visit (HOSPITAL_COMMUNITY): Payer: Self-pay

## 2015-04-29 LAB — CBC WITH DIFFERENTIAL/PLATELET
BASOS ABS: 0 10*3/uL (ref 0.0–0.1)
Basophils Relative: 0 %
EOS ABS: 0.2 10*3/uL (ref 0.0–0.7)
EOS PCT: 2 %
HCT: 33.1 % — ABNORMAL LOW (ref 36.0–46.0)
Hemoglobin: 10.5 g/dL — ABNORMAL LOW (ref 12.0–15.0)
LYMPHS ABS: 1.4 10*3/uL (ref 0.7–4.0)
Lymphocytes Relative: 17 %
MCH: 28.2 pg (ref 26.0–34.0)
MCHC: 31.7 g/dL (ref 30.0–36.0)
MCV: 88.7 fL (ref 78.0–100.0)
MONO ABS: 0.6 10*3/uL (ref 0.1–1.0)
Monocytes Relative: 7 %
Neutro Abs: 6 10*3/uL (ref 1.7–7.7)
Neutrophils Relative %: 74 %
PLATELETS: 299 10*3/uL (ref 150–400)
RBC: 3.73 MIL/uL — AB (ref 3.87–5.11)
RDW: 13.6 % (ref 11.5–15.5)
WBC: 8.1 10*3/uL (ref 4.0–10.5)

## 2015-04-29 LAB — BASIC METABOLIC PANEL
Anion gap: 10 (ref 5–15)
BUN: 6 mg/dL (ref 6–20)
CO2: 27 mmol/L (ref 22–32)
Calcium: 8.5 mg/dL — ABNORMAL LOW (ref 8.9–10.3)
Chloride: 101 mmol/L (ref 101–111)
Creatinine, Ser: 0.4 mg/dL — ABNORMAL LOW (ref 0.44–1.00)
GFR calc Af Amer: >60 mL/min (ref >60)
GFR calc non Af Amer: >60 mL/min (ref >60)
GLUCOSE: 149 mg/dL — AB (ref 65–99)
POTASSIUM: 3.2 mmol/L — AB (ref 3.5–5.1)
SODIUM: 138 mmol/L (ref 135–145)

## 2015-04-29 LAB — URINALYSIS, ROUTINE W REFLEX MICROSCOPIC
BILIRUBIN URINE: NEGATIVE
Glucose, UA: NEGATIVE mg/dL
Hgb urine dipstick: NEGATIVE
Ketones, ur: 15 mg/dL — AB
LEUKOCYTES UA: NEGATIVE
NITRITE: NEGATIVE
PH: 6.5 (ref 5.0–8.0)
Protein, ur: NEGATIVE mg/dL
SPECIFIC GRAVITY, URINE: 1.019 (ref 1.005–1.030)
UROBILINOGEN UA: 1 mg/dL (ref 0.0–1.0)

## 2015-04-29 LAB — PHOSPHORUS: Phosphorus: 3.7 mg/dL (ref 2.5–4.6)

## 2015-04-29 LAB — PROCALCITONIN

## 2015-04-29 LAB — MAGNESIUM: Magnesium: 1.8 mg/dL (ref 1.7–2.4)

## 2015-04-29 MED ORDER — IOHEXOL 300 MG/ML  SOLN
50.0000 mL | Freq: Once | INTRAMUSCULAR | Status: DC | PRN
  Administered 2015-04-29: 50 mL via ORAL

## 2015-04-30 LAB — HEMOGLOBIN A1C
HEMOGLOBIN A1C: 9.7 % — AB (ref 4.8–5.6)
MEAN PLASMA GLUCOSE: 232 mg/dL

## 2015-04-30 LAB — BASIC METABOLIC PANEL
Anion gap: 9 (ref 5–15)
BUN: 9 mg/dL (ref 6–20)
CALCIUM: 8.3 mg/dL — AB (ref 8.9–10.3)
CO2: 27 mmol/L (ref 22–32)
CREATININE: 0.44 mg/dL (ref 0.44–1.00)
Chloride: 101 mmol/L (ref 101–111)
GLUCOSE: 285 mg/dL — AB (ref 65–99)
Potassium: 3.8 mmol/L (ref 3.5–5.1)
Sodium: 137 mmol/L (ref 135–145)

## 2015-04-30 LAB — VANCOMYCIN, TROUGH: Vancomycin Tr: <4 ug/mL — ABNORMAL LOW (ref 10.0–20.0)

## 2015-04-30 LAB — URINE CULTURE: CULTURE: NO GROWTH

## 2015-05-01 LAB — PROCALCITONIN

## 2015-05-02 LAB — CBC WITH DIFFERENTIAL/PLATELET
BASOS ABS: 0 10*3/uL (ref 0.0–0.1)
BASOS PCT: 0 %
EOS PCT: 3 %
Eosinophils Absolute: 0.2 10*3/uL (ref 0.0–0.7)
HCT: 33.8 % — ABNORMAL LOW (ref 36.0–46.0)
Hemoglobin: 10.7 g/dL — ABNORMAL LOW (ref 12.0–15.0)
LYMPHS PCT: 23 %
Lymphs Abs: 2 10*3/uL (ref 0.7–4.0)
MCH: 28.1 pg (ref 26.0–34.0)
MCHC: 31.7 g/dL (ref 30.0–36.0)
MCV: 88.7 fL (ref 78.0–100.0)
MONO ABS: 0.4 10*3/uL (ref 0.1–1.0)
Monocytes Relative: 5 %
NEUTROS ABS: 6 10*3/uL (ref 1.7–7.7)
Neutrophils Relative %: 69 %
PLATELETS: 335 10*3/uL (ref 150–400)
RBC: 3.81 MIL/uL — ABNORMAL LOW (ref 3.87–5.11)
RDW: 13.7 % (ref 11.5–15.5)
WBC: 8.7 10*3/uL (ref 4.0–10.5)

## 2015-05-02 LAB — VANCOMYCIN, TROUGH: Vancomycin Tr: 12 ug/mL (ref 10.0–20.0)

## 2015-05-02 LAB — BASIC METABOLIC PANEL
Anion gap: 8 (ref 5–15)
BUN: 8 mg/dL (ref 6–20)
CALCIUM: 8.5 mg/dL — AB (ref 8.9–10.3)
CO2: 27 mmol/L (ref 22–32)
CREATININE: 0.37 mg/dL — AB (ref 0.44–1.00)
Chloride: 103 mmol/L (ref 101–111)
GFR calc Af Amer: >60 mL/min (ref >60)
GLUCOSE: 227 mg/dL — AB (ref 65–99)
Potassium: 3.9 mmol/L (ref 3.5–5.1)
Sodium: 138 mmol/L (ref 135–145)

## 2015-05-02 LAB — MAGNESIUM: Magnesium: 1.9 mg/dL (ref 1.7–2.4)

## 2015-05-02 LAB — PHOSPHORUS: Phosphorus: 3.6 mg/dL (ref 2.5–4.6)

## 2015-05-03 LAB — CULTURE, RESPIRATORY: CULTURE: NO GROWTH

## 2015-05-03 LAB — CULTURE, RESPIRATORY W GRAM STAIN

## 2015-05-03 LAB — VANCOMYCIN, TROUGH: VANCOMYCIN TR: 15 ug/mL (ref 10.0–20.0)

## 2015-05-04 LAB — CBC WITH DIFFERENTIAL/PLATELET
BASOS ABS: 0 10*3/uL (ref 0.0–0.1)
Basophils Relative: 0 %
EOS ABS: 0.1 10*3/uL (ref 0.0–0.7)
EOS PCT: 2 %
HCT: 33.3 % — ABNORMAL LOW (ref 36.0–46.0)
Hemoglobin: 10.4 g/dL — ABNORMAL LOW (ref 12.0–15.0)
LYMPHS PCT: 29 %
Lymphs Abs: 2.3 10*3/uL (ref 0.7–4.0)
MCH: 27.7 pg (ref 26.0–34.0)
MCHC: 31.2 g/dL (ref 30.0–36.0)
MCV: 88.6 fL (ref 78.0–100.0)
MONO ABS: 0.5 10*3/uL (ref 0.1–1.0)
Monocytes Relative: 7 %
Neutro Abs: 4.9 10*3/uL (ref 1.7–7.7)
Neutrophils Relative %: 62 %
PLATELETS: 321 10*3/uL (ref 150–400)
RBC: 3.76 MIL/uL — ABNORMAL LOW (ref 3.87–5.11)
RDW: 13.8 % (ref 11.5–15.5)
WBC: 7.8 10*3/uL (ref 4.0–10.5)

## 2015-05-04 LAB — BASIC METABOLIC PANEL
ANION GAP: 9 (ref 5–15)
BUN: 9 mg/dL (ref 6–20)
CALCIUM: 8.8 mg/dL — AB (ref 8.9–10.3)
CO2: 28 mmol/L (ref 22–32)
Chloride: 103 mmol/L (ref 101–111)
Creatinine, Ser: 0.37 mg/dL — ABNORMAL LOW (ref 0.44–1.00)
GFR calc Af Amer: >60 mL/min (ref >60)
GLUCOSE: 164 mg/dL — AB (ref 65–99)
Potassium: 3.8 mmol/L (ref 3.5–5.1)
Sodium: 140 mmol/L (ref 135–145)

## 2015-05-04 LAB — VANCOMYCIN, TROUGH: Vancomycin Tr: 23 ug/mL — ABNORMAL HIGH (ref 10.0–20.0)

## 2015-05-06 LAB — VANCOMYCIN, TROUGH

## 2015-05-07 ENCOUNTER — Other Ambulatory Visit (HOSPITAL_COMMUNITY): Payer: Self-pay

## 2015-05-08 LAB — BASIC METABOLIC PANEL
Anion gap: 9 (ref 5–15)
BUN: 13 mg/dL (ref 6–20)
CHLORIDE: 103 mmol/L (ref 101–111)
CO2: 27 mmol/L (ref 22–32)
Calcium: 9.4 mg/dL (ref 8.9–10.3)
Creatinine, Ser: 0.43 mg/dL — ABNORMAL LOW (ref 0.44–1.00)
GFR calc Af Amer: >60 mL/min (ref >60)
GFR calc non Af Amer: >60 mL/min (ref >60)
GLUCOSE: 143 mg/dL — AB (ref 65–99)
POTASSIUM: 4.2 mmol/L (ref 3.5–5.1)
Sodium: 139 mmol/L (ref 135–145)

## 2015-05-08 LAB — PHOSPHORUS: Phosphorus: 4.7 mg/dL — ABNORMAL HIGH (ref 2.5–4.6)

## 2015-05-08 LAB — CBC WITH DIFFERENTIAL/PLATELET
Basophils Absolute: 0 10*3/uL (ref 0.0–0.1)
Basophils Relative: 0 %
EOS PCT: 4 %
Eosinophils Absolute: 0.3 10*3/uL (ref 0.0–0.7)
HEMATOCRIT: 37.3 % (ref 36.0–46.0)
HEMOGLOBIN: 11.9 g/dL — AB (ref 12.0–15.0)
LYMPHS ABS: 2.6 10*3/uL (ref 0.7–4.0)
LYMPHS PCT: 31 %
MCH: 28.5 pg (ref 26.0–34.0)
MCHC: 31.9 g/dL (ref 30.0–36.0)
MCV: 89.2 fL (ref 78.0–100.0)
Monocytes Absolute: 0.7 10*3/uL (ref 0.1–1.0)
Monocytes Relative: 8 %
Neutro Abs: 4.8 10*3/uL (ref 1.7–7.7)
Neutrophils Relative %: 57 %
PLATELETS: 269 10*3/uL (ref 150–400)
RBC: 4.18 MIL/uL (ref 3.87–5.11)
RDW: 14.4 % (ref 11.5–15.5)
WBC: 8.4 10*3/uL (ref 4.0–10.5)

## 2015-05-08 LAB — VANCOMYCIN, TROUGH: Vancomycin Tr: 15 ug/mL (ref 10.0–20.0)

## 2015-05-08 LAB — MAGNESIUM: Magnesium: 1.9 mg/dL (ref 1.7–2.4)

## 2015-05-12 LAB — CBC WITH DIFFERENTIAL/PLATELET
BASOS ABS: 0 10*3/uL (ref 0.0–0.1)
Basophils Relative: 0 %
EOS ABS: 0.2 10*3/uL (ref 0.0–0.7)
EOS PCT: 3 %
HCT: 38.1 % (ref 36.0–46.0)
HEMOGLOBIN: 11.9 g/dL — AB (ref 12.0–15.0)
LYMPHS PCT: 33 %
Lymphs Abs: 3 10*3/uL (ref 0.7–4.0)
MCH: 27.8 pg (ref 26.0–34.0)
MCHC: 31.2 g/dL (ref 30.0–36.0)
MCV: 89 fL (ref 78.0–100.0)
Monocytes Absolute: 0.6 10*3/uL (ref 0.1–1.0)
Monocytes Relative: 6 %
NEUTROS PCT: 58 %
Neutro Abs: 5.5 10*3/uL (ref 1.7–7.7)
PLATELETS: 256 10*3/uL (ref 150–400)
RBC: 4.28 MIL/uL (ref 3.87–5.11)
RDW: 14.5 % (ref 11.5–15.5)
WBC: 9.3 10*3/uL (ref 4.0–10.5)

## 2015-05-12 LAB — BASIC METABOLIC PANEL
ANION GAP: 10 (ref 5–15)
BUN: 25 mg/dL — ABNORMAL HIGH (ref 6–20)
CO2: 28 mmol/L (ref 22–32)
Calcium: 9.6 mg/dL (ref 8.9–10.3)
Chloride: 99 mmol/L — ABNORMAL LOW (ref 101–111)
Creatinine, Ser: 0.59 mg/dL (ref 0.44–1.00)
GFR calc Af Amer: >60 mL/min (ref >60)
Glucose, Bld: 166 mg/dL — ABNORMAL HIGH (ref 65–99)
POTASSIUM: 4.7 mmol/L (ref 3.5–5.1)
SODIUM: 137 mmol/L (ref 135–145)

## 2015-05-14 ENCOUNTER — Other Ambulatory Visit (HOSPITAL_COMMUNITY): Payer: Self-pay

## 2015-05-15 LAB — CBC
HEMATOCRIT: 36.7 % (ref 36.0–46.0)
Hemoglobin: 11.7 g/dL — ABNORMAL LOW (ref 12.0–15.0)
MCH: 28.4 pg (ref 26.0–34.0)
MCHC: 31.9 g/dL (ref 30.0–36.0)
MCV: 89.1 fL (ref 78.0–100.0)
PLATELETS: 231 10*3/uL (ref 150–400)
RBC: 4.12 MIL/uL (ref 3.87–5.11)
RDW: 14.3 % (ref 11.5–15.5)
WBC: 9.7 10*3/uL (ref 4.0–10.5)

## 2015-05-15 LAB — BASIC METABOLIC PANEL
ANION GAP: 10 (ref 5–15)
BUN: 37 mg/dL — ABNORMAL HIGH (ref 6–20)
CALCIUM: 9.8 mg/dL (ref 8.9–10.3)
CO2: 30 mmol/L (ref 22–32)
Chloride: 99 mmol/L — ABNORMAL LOW (ref 101–111)
Creatinine, Ser: 0.64 mg/dL (ref 0.44–1.00)
GLUCOSE: 151 mg/dL — AB (ref 65–99)
POTASSIUM: 4.9 mmol/L (ref 3.5–5.1)
Sodium: 139 mmol/L (ref 135–145)

## 2015-05-16 ENCOUNTER — Inpatient Hospital Stay (HOSPITAL_COMMUNITY)
Admission: RE | Admit: 2015-05-16 | Discharge: 2015-06-07 | DRG: 056 | Disposition: A | Discharge status: Other Acute Inpatient Hospital | Payer: 59 | Source: Other Acute Inpatient Hospital | Attending: Physical Medicine & Rehabilitation | Admitting: Physical Medicine & Rehabilitation

## 2015-05-16 DIAGNOSIS — B370 Candidal stomatitis: Secondary | ICD-10-CM

## 2015-05-16 DIAGNOSIS — E1310 Other specified diabetes mellitus with ketoacidosis without coma: Secondary | ICD-10-CM

## 2015-05-16 DIAGNOSIS — M5136 Other intervertebral disc degeneration, lumbar region: Secondary | ICD-10-CM | POA: Diagnosis not present

## 2015-05-16 DIAGNOSIS — F411 Generalized anxiety disorder: Secondary | ICD-10-CM | POA: Diagnosis not present

## 2015-05-16 DIAGNOSIS — I2699 Other pulmonary embolism without acute cor pulmonale: Secondary | ICD-10-CM | POA: Diagnosis not present

## 2015-05-16 DIAGNOSIS — G8929 Other chronic pain: Secondary | ICD-10-CM

## 2015-05-16 DIAGNOSIS — I69359 Hemiplegia and hemiparesis following cerebral infarction affecting unspecified side: Secondary | ICD-10-CM

## 2015-05-16 DIAGNOSIS — Z7984 Long term (current) use of oral hypoglycemic drugs: Secondary | ICD-10-CM

## 2015-05-16 DIAGNOSIS — Z79899 Other long term (current) drug therapy: Secondary | ICD-10-CM | POA: Diagnosis not present

## 2015-05-16 DIAGNOSIS — I69390 Apraxia following cerebral infarction: Secondary | ICD-10-CM | POA: Diagnosis not present

## 2015-05-16 DIAGNOSIS — Z9114 Patient's other noncompliance with medication regimen: Secondary | ICD-10-CM | POA: Diagnosis not present

## 2015-05-16 DIAGNOSIS — R339 Retention of urine, unspecified: Secondary | ICD-10-CM | POA: Diagnosis not present

## 2015-05-16 DIAGNOSIS — B9620 Unspecified Escherichia coli [E. coli] as the cause of diseases classified elsewhere: Secondary | ICD-10-CM | POA: Diagnosis not present

## 2015-05-16 DIAGNOSIS — Z7902 Long term (current) use of antithrombotics/antiplatelets: Secondary | ICD-10-CM

## 2015-05-16 DIAGNOSIS — M797 Fibromyalgia: Secondary | ICD-10-CM | POA: Diagnosis not present

## 2015-05-16 DIAGNOSIS — I69351 Hemiplegia and hemiparesis following cerebral infarction affecting right dominant side: Principal | ICD-10-CM

## 2015-05-16 DIAGNOSIS — E1140 Type 2 diabetes mellitus with diabetic neuropathy, unspecified: Secondary | ICD-10-CM | POA: Diagnosis not present

## 2015-05-16 DIAGNOSIS — R1310 Dysphagia, unspecified: Secondary | ICD-10-CM | POA: Diagnosis not present

## 2015-05-16 DIAGNOSIS — Z853 Personal history of malignant neoplasm of breast: Secondary | ICD-10-CM | POA: Diagnosis not present

## 2015-05-16 DIAGNOSIS — R061 Stridor: Secondary | ICD-10-CM | POA: Diagnosis not present

## 2015-05-16 DIAGNOSIS — I25119 Atherosclerotic heart disease of native coronary artery with unspecified angina pectoris: Secondary | ICD-10-CM

## 2015-05-16 DIAGNOSIS — E11649 Type 2 diabetes mellitus with hypoglycemia without coma: Secondary | ICD-10-CM | POA: Diagnosis not present

## 2015-05-16 DIAGNOSIS — E1165 Type 2 diabetes mellitus with hyperglycemia: Secondary | ICD-10-CM | POA: Diagnosis not present

## 2015-05-16 DIAGNOSIS — R0602 Shortness of breath: Secondary | ICD-10-CM

## 2015-05-16 DIAGNOSIS — K5900 Constipation, unspecified: Secondary | ICD-10-CM | POA: Diagnosis not present

## 2015-05-16 DIAGNOSIS — K920 Hematemesis: Secondary | ICD-10-CM | POA: Insufficient documentation

## 2015-05-16 DIAGNOSIS — N390 Urinary tract infection, site not specified: Secondary | ICD-10-CM | POA: Diagnosis not present

## 2015-05-16 DIAGNOSIS — Z794 Long term (current) use of insulin: Secondary | ICD-10-CM | POA: Diagnosis not present

## 2015-05-16 DIAGNOSIS — I69320 Aphasia following cerebral infarction: Secondary | ICD-10-CM

## 2015-05-16 DIAGNOSIS — I2510 Atherosclerotic heart disease of native coronary artery without angina pectoris: Secondary | ICD-10-CM | POA: Diagnosis not present

## 2015-05-16 DIAGNOSIS — R0600 Dyspnea, unspecified: Secondary | ICD-10-CM

## 2015-05-16 DIAGNOSIS — I69391 Dysphagia following cerebral infarction: Secondary | ICD-10-CM | POA: Diagnosis not present

## 2015-05-16 DIAGNOSIS — I10 Essential (primary) hypertension: Secondary | ICD-10-CM | POA: Insufficient documentation

## 2015-05-16 DIAGNOSIS — R000 Tachycardia, unspecified: Secondary | ICD-10-CM | POA: Diagnosis not present

## 2015-05-16 DIAGNOSIS — R0902 Hypoxemia: Secondary | ICD-10-CM

## 2015-05-16 DIAGNOSIS — G894 Chronic pain syndrome: Secondary | ICD-10-CM | POA: Diagnosis not present

## 2015-05-16 DIAGNOSIS — I619 Nontraumatic intracerebral hemorrhage, unspecified: Secondary | ICD-10-CM | POA: Diagnosis not present

## 2015-05-16 DIAGNOSIS — Z931 Gastrostomy status: Secondary | ICD-10-CM

## 2015-05-16 DIAGNOSIS — E118 Type 2 diabetes mellitus with unspecified complications: Secondary | ICD-10-CM | POA: Diagnosis not present

## 2015-05-16 DIAGNOSIS — R7989 Other specified abnormal findings of blood chemistry: Secondary | ICD-10-CM | POA: Diagnosis not present

## 2015-05-16 DIAGNOSIS — E1110 Type 2 diabetes mellitus with ketoacidosis without coma: Secondary | ICD-10-CM

## 2015-05-16 DIAGNOSIS — R4701 Aphasia: Secondary | ICD-10-CM | POA: Diagnosis not present

## 2015-05-16 DIAGNOSIS — I63312 Cerebral infarction due to thrombosis of left middle cerebral artery: Secondary | ICD-10-CM | POA: Diagnosis not present

## 2015-05-16 DIAGNOSIS — R040 Epistaxis: Secondary | ICD-10-CM

## 2015-05-16 DIAGNOSIS — R32 Unspecified urinary incontinence: Secondary | ICD-10-CM | POA: Diagnosis not present

## 2015-05-16 DIAGNOSIS — Z930 Tracheostomy status: Secondary | ICD-10-CM | POA: Diagnosis not present

## 2015-05-16 DIAGNOSIS — E785 Hyperlipidemia, unspecified: Secondary | ICD-10-CM

## 2015-05-16 DIAGNOSIS — E860 Dehydration: Secondary | ICD-10-CM | POA: Diagnosis not present

## 2015-05-16 DIAGNOSIS — F17200 Nicotine dependence, unspecified, uncomplicated: Secondary | ICD-10-CM | POA: Diagnosis not present

## 2015-05-16 DIAGNOSIS — I959 Hypotension, unspecified: Secondary | ICD-10-CM | POA: Insufficient documentation

## 2015-05-16 HISTORY — DX: Gastrointestinal hemorrhage, unspecified: K92.2

## 2015-05-16 HISTORY — DX: Other specified abnormal findings of blood chemistry: R79.89

## 2015-05-16 HISTORY — DX: Urinary tract infection, site not specified: N39.0

## 2015-05-16 HISTORY — DX: Other pulmonary embolism without acute cor pulmonale: I26.99

## 2015-05-16 HISTORY — DX: Other specified abnormalities of plasma proteins: R77.8

## 2015-05-16 LAB — GLUCOSE, CAPILLARY: GLUCOSE-CAPILLARY: 183 mg/dL — AB (ref 65–99)

## 2015-05-16 MED ORDER — PROCHLORPERAZINE 25 MG RE SUPP: 12.5000 mg | Freq: Four times a day (QID) | RECTAL | Status: DC | PRN

## 2015-05-16 MED ORDER — VITAMIN B-1 100 MG PO TABS: 100.0000 mg | ORAL_TABLET | Freq: Every day | ORAL | Status: DC

## 2015-05-16 MED ORDER — FREE WATER
200.0000 mL | Freq: Three times a day (TID) | Status: DC
Start: 2015-05-16 — End: 2015-05-16

## 2015-05-16 MED ORDER — BISACODYL 10 MG RE SUPP
10.0000 mg | Freq: Every day | RECTAL | Status: DC | PRN
  Administered 2015-05-17: 10 mg via RECTAL
  Filled 2015-05-16: qty 1

## 2015-05-16 MED ORDER — LISINOPRIL 10 MG PO TABS
10.0000 mg | ORAL_TABLET | Freq: Two times a day (BID) | ORAL | Status: DC
  Administered 2015-05-16 – 2015-06-06 (×43): 10 mg via ORAL
  Filled 2015-05-16 (×43): qty 1

## 2015-05-16 MED ORDER — CLOPIDOGREL BISULFATE 75 MG PO TABS: 75.0000 mg | ORAL_TABLET | Freq: Every day | ORAL | Status: DC

## 2015-05-16 MED ORDER — FAMOTIDINE 20 MG PO TABS: 20.0000 mg | ORAL_TABLET | Freq: Two times a day (BID) | ORAL | Status: DC

## 2015-05-16 MED ORDER — ADULT MULTIVITAMIN W/MINERALS CH
1.0000 | ORAL_TABLET | Freq: Every day | ORAL | Status: DC
Start: 2015-05-17 — End: 2015-06-05
  Administered 2015-05-17 – 2015-06-04 (×19): 1 via ORAL
  Filled 2015-05-16 (×19): qty 1

## 2015-05-16 MED ORDER — ENOXAPARIN SODIUM 40 MG/0.4ML ~~LOC~~ SOLN
40.0000 mg | SUBCUTANEOUS | Status: DC
Start: 2015-05-16 — End: 2015-06-07
  Administered 2015-05-16 – 2015-06-06 (×20): 40 mg via SUBCUTANEOUS
  Filled 2015-05-16 (×21): qty 0.4

## 2015-05-16 MED ORDER — PROCHLORPERAZINE EDISYLATE 5 MG/ML IJ SOLN
5.0000 mg | Freq: Four times a day (QID) | INTRAMUSCULAR | Status: DC | PRN
  Administered 2015-06-05: 10 mg via INTRAMUSCULAR
  Filled 2015-05-16: qty 2

## 2015-05-16 MED ORDER — AMLODIPINE BESYLATE 10 MG PO TABS
10.0000 mg | ORAL_TABLET | Freq: Every day | ORAL | Status: DC
  Administered 2015-05-18 – 2015-05-20 (×2): 10 mg via ORAL
  Filled 2015-05-16 (×4): qty 1

## 2015-05-16 MED ORDER — INSULIN ASPART 100 UNIT/ML ~~LOC~~ SOLN
0.0000 [IU] | Freq: Every day | SUBCUTANEOUS | Status: DC
  Administered 2015-05-17: 2 [IU] via SUBCUTANEOUS

## 2015-05-16 MED ORDER — ALUM & MAG HYDROXIDE-SIMETH 200-200-20 MG/5ML PO SUSP: 30.0000 mL | ORAL | Status: DC | PRN

## 2015-05-16 MED ORDER — GLIPIZIDE 5 MG PO TABS: 5.0000 mg | ORAL_TABLET | Freq: Every day | ORAL | Status: DC

## 2015-05-16 MED ORDER — FREE WATER
100.0000 mL | Freq: Three times a day (TID) | Status: DC
  Administered 2015-05-16 – 2015-05-29 (×51): 100 mL

## 2015-05-16 MED ORDER — ACETAMINOPHEN 325 MG PO TABS: 650.0000 mg | ORAL_TABLET | ORAL | Status: DC | PRN

## 2015-05-16 MED ORDER — GLIPIZIDE 5 MG PO TABS
5.0000 mg | ORAL_TABLET | Freq: Every day | ORAL | Status: DC
  Administered 2015-05-17 – 2015-06-03 (×18): 5 mg via ORAL
  Filled 2015-05-16 (×19): qty 1

## 2015-05-16 MED ORDER — ATORVASTATIN CALCIUM 20 MG PO TABS
20.0000 mg | ORAL_TABLET | Freq: Every day | ORAL | Status: DC
  Administered 2015-05-17 – 2015-06-06 (×21): 20 mg via ORAL
  Filled 2015-05-16 (×8): qty 1
  Filled 2015-05-16: qty 2
  Filled 2015-05-16 (×4): qty 1
  Filled 2015-05-16: qty 2
  Filled 2015-05-16 (×8): qty 1

## 2015-05-16 MED ORDER — ZINC SULFATE 220 (50 ZN) MG PO CAPS
220.0000 mg | ORAL_CAPSULE | Freq: Two times a day (BID) | ORAL | Status: DC
  Administered 2015-05-16 – 2015-06-07 (×43): 220 mg via ORAL
  Filled 2015-05-16 (×49): qty 1

## 2015-05-16 MED ORDER — NICOTINE 7 MG/24HR TD PT24
7.0000 mg | MEDICATED_PATCH | Freq: Every day | TRANSDERMAL | Status: DC
  Administered 2015-05-16 – 2015-06-06 (×22): 7 mg via TRANSDERMAL
  Filled 2015-05-16 (×25): qty 1

## 2015-05-16 MED ORDER — INSULIN GLARGINE 100 UNIT/ML ~~LOC~~ SOLN
24.0000 [IU] | Freq: Two times a day (BID) | SUBCUTANEOUS | Status: DC
  Administered 2015-05-16 – 2015-06-03 (×36): 24 [IU] via SUBCUTANEOUS
  Filled 2015-05-16 (×43): qty 0.24

## 2015-05-16 MED ORDER — INSULIN ASPART 100 UNIT/ML ~~LOC~~ SOLN
0.0000 [IU] | Freq: Three times a day (TID) | SUBCUTANEOUS | Status: DC
  Administered 2015-05-17 – 2015-05-18 (×3): 1 [IU] via SUBCUTANEOUS
  Administered 2015-05-18: 2 [IU] via SUBCUTANEOUS
  Administered 2015-05-19 – 2015-05-25 (×4): 1 [IU] via SUBCUTANEOUS
  Administered 2015-06-05 – 2015-06-06 (×4): 2 [IU] via SUBCUTANEOUS

## 2015-05-16 MED ORDER — PRO-STAT SUGAR FREE PO LIQD
30.0000 mL | Freq: Two times a day (BID) | ORAL | Status: DC
  Administered 2015-05-16 – 2015-05-31 (×31): 30 mL via ORAL
  Filled 2015-05-16 (×31): qty 30

## 2015-05-16 MED ORDER — FLEET ENEMA 7-19 GM/118ML RE ENEM: 1.0000 | ENEMA | Freq: Once | RECTAL | Status: DC | PRN

## 2015-05-16 MED ORDER — DIPHENHYDRAMINE HCL 12.5 MG/5ML PO ELIX: 12.5000 mg | ORAL_SOLUTION | Freq: Four times a day (QID) | ORAL | Status: DC | PRN

## 2015-05-16 MED ORDER — ACETAMINOPHEN 325 MG PO TABS
325.0000 mg | ORAL_TABLET | ORAL | Status: DC | PRN
  Administered 2015-05-17 – 2015-05-26 (×11): 650 mg via ORAL
  Administered 2015-05-30: 325 mg via ORAL
  Filled 2015-05-16 (×12): qty 2

## 2015-05-16 MED ORDER — CLONIDINE HCL 0.2 MG PO TABS
0.2000 mg | ORAL_TABLET | Freq: Two times a day (BID) | ORAL | Status: DC
  Administered 2015-05-16 – 2015-05-20 (×7): 0.2 mg via ORAL
  Filled 2015-05-16 (×10): qty 1

## 2015-05-16 MED ORDER — GUAIFENESIN-DM 100-10 MG/5ML PO SYRP: 5.0000 mL | ORAL_SOLUTION | Freq: Four times a day (QID) | ORAL | Status: DC | PRN

## 2015-05-16 MED ORDER — PROCHLORPERAZINE MALEATE 5 MG PO TABS
5.0000 mg | ORAL_TABLET | Freq: Four times a day (QID) | ORAL | Status: DC | PRN
  Administered 2015-06-01: 10 mg via ORAL
  Filled 2015-05-16: qty 1
  Filled 2015-05-16: qty 2

## 2015-05-16 MED ORDER — HYDRALAZINE HCL 10 MG PO TABS
20.0000 mg | ORAL_TABLET | Freq: Three times a day (TID) | ORAL | Status: DC
  Administered 2015-05-17 – 2015-06-06 (×56): 20 mg via ORAL
  Filled 2015-05-16 (×59): qty 2

## 2015-05-16 MED ORDER — CLONIDINE HCL 0.2 MG PO TABS: 0.2000 mg | ORAL_TABLET | Freq: Two times a day (BID) | ORAL | Status: DC

## 2015-05-16 MED ORDER — VITAMIN B-1 100 MG PO TABS
100.0000 mg | ORAL_TABLET | Freq: Every day | ORAL | Status: DC
  Administered 2015-05-17 – 2015-06-07 (×22): 100 mg via ORAL
  Filled 2015-05-16 (×22): qty 1

## 2015-05-16 MED ORDER — TRAZODONE HCL 50 MG PO TABS: 25.0000 mg | ORAL_TABLET | Freq: Every evening | ORAL | Status: DC | PRN

## 2015-05-16 MED ORDER — LISINOPRIL 10 MG PO TABS: 10.0000 mg | ORAL_TABLET | Freq: Two times a day (BID) | ORAL | Status: DC

## 2015-05-16 MED ORDER — HYDRALAZINE HCL 10 MG PO TABS: 20.0000 mg | ORAL_TABLET | Freq: Three times a day (TID) | ORAL | Status: DC

## 2015-05-16 MED ORDER — CLOPIDOGREL BISULFATE 75 MG PO TABS
75.0000 mg | ORAL_TABLET | Freq: Every day | ORAL | Status: DC
  Administered 2015-05-17 – 2015-06-05 (×20): 75 mg via ORAL
  Filled 2015-05-16 (×21): qty 1

## 2015-05-16 MED ORDER — FAMOTIDINE 20 MG PO TABS
20.0000 mg | ORAL_TABLET | Freq: Two times a day (BID) | ORAL | Status: DC
  Administered 2015-05-16 – 2015-06-04 (×38): 20 mg via ORAL
  Filled 2015-05-16 (×38): qty 1

## 2015-05-16 MED ORDER — INSULIN GLARGINE 100 UNIT/ML ~~LOC~~ SOLN: 24.0000 [IU] | Freq: Two times a day (BID) | SUBCUTANEOUS | Status: DC

## 2015-05-16 NOTE — Interval H&P Note (Signed)
Kerry [name] was admitted today to Inpatient Rehabilitation with the diagnosis of late effect Left MCA infarct.  The patient's history has been reviewed, patient examined, and there is no change in status.  Patient continues to be appropriate for intensive inpatient rehabilitation.  I have reviewed the patient's chart and labs.  Questions were answered to the patient's satisfaction. The PAPE has been reviewed and assessment remains appropriate.  Pama Roskos [name] 05/16/2015, 8:54 PM

## 2015-05-16 NOTE — H&P (Signed)
Physical Medicine and Rehabilitation Admission H&P    CC: Right sided weakness, dysphagia, aphasia,    HPI:  Kerry [name] is a 59 y.o. female with history of breast cancer, HTN, CAD, chronic pain, DM type 2, medication non-compliance who was admitted on 04/11/15 with difficulty speaking, right facial and right sided weakness. CT showed dense L-MCA sign and TPA started but she had worsening of right hemiparesis with lethargy. Repeat CT negative for bleed and she underwent cerebral angio showed atherosclerotic occlusion of L-ICA and was treated with embolectomy and stent placement and TICI revascularization. Post procedure developed acute respiratory failure with dense right hemiparesis with global aphasia. She was vent dependent and developed small patchy areas of hemorrhage within acuteLarge L-MCA infarct as well as cerebral edema. She was started on hypertonic saline for edema management. Malignant HTN treated with cardene drip and cardiology consulted for assistance with severe bradycardia with periods of asystole. Temporary pacemaker placed by Dr. Martinique on 10/21. She continued to have lethargy with decrease in LOC requiring PEG placement by Dr. Hulen Skains and Lurline Idol with BAL performed by Dr Nelda Marseille. She developed MSSA PNA that was treated with IV antibiotics and required IV diuresis for CHF.  She was weaned to trach collar but continued to have significant secretions requiring frequent suctioning.  She was discharged to [facility] on 11/02 for medical management and rehab.  Respiratory status has been stable and she was decannulated on 11/21.  MBS done 11/14 and feeding trials initiated. She was started on dysphagia 1, honey liquids on 11/21 and tube feeds ongoing as supplement.  She continues on plavix for secondary stroke prevention and is tolerating this without SE. She has been showing improvement in activity tolerance and has been tolerating therapy. Patient with resultant right hemiparesis with  sensory deficits, expressive > receptive aphasia with apraxia and dysphagia.  She was felt to be a good candidate for intensive rehab program and CIR was recommended for follow up therapy.   Per Family: Review of Systems  Unable to perform ROS: patient nonverbal    Past Medical History: Hypertension    . Coronary artery disease   . Diabetes mellitus   . Breast cancer ([place]) 03/10/2007    Left breat  . Degenerative disc disease, cervical   . Degenerative disc disease, lumbar   . Fibromyalgia   . Hyperlipidemia   . Carpal tunnel syndrome on right          Past Surgical History:  Procedure Laterality Date  . Cardiac surgery    . Appendectomy    . Breast surgery    . Mastectomy Left 03/2007  . Cardiac catheterization  2005  . Vein surgery Right 2005  . Tubal ligation Right    Percutaneous feeding tube  04/18/15   Tracheostomy   04/18/15  . Radiology with anesthesia N/A 04/11/2015    Procedure: RADIOLOGY WITH ANESTHESIA; Surgeon: Luanne Bras, MD; Location: [place]; Service: Radiology; Laterality: N/A;  . Cardiac catheterization N/A 04/13/2015    Procedure: Temporary Pacemaker; Surgeon: Peter M Martinique, MD; Location: [place] [facility]; Service: Cardiovascular; Laterality: N/A         Family History:  Problem Relation Age of Onset  . Cancer Mother     Breast cancer (right)  . Diabetes Sister   . Cancer Maternal Aunt     Breast cancer  . Cancer Maternal Aunt     Breast cancer  . Cancer Cousin     Breast Cancer  Social History:  Married. Independent PTA and has been a homemaker. Family reports that she has been smoking--1/2 to 1 PPD. She has never used smokeless tobacco. Per reports that she drinks alcohol occasionally. Per reports that she does not use illicit drugs.    Allergies:  Allergies  Allergen Reactions  . Codeine      Nausea?-- "made her sick"      Medications Prior to admission: Medication Sig Dispense Refill  . LISINOPRIL PO Take 20 mg by mouth daily.     Marland Kitchen LORazepam (ATIVAN) 1 MG tablet Take 1 mg by mouth at bedtime.    Marland Kitchen amLODipine (NORVASC) 10 MG tablet Take 10 mg by mouth daily.    . cloNIDine (CATAPRES) 0.1 MG tablet Take 0.1 mg by mouth 2 (two) times daily.    Marland Kitchen glipiZIDE (GLUCOTROL XL) 10 MG 24 hr tablet Take 10 mg by mouth daily.    . insulin glargine (LANTUS) 100 UNIT/ML injection Inject 30 Units into the skin at bedtime.     . metoprolol succinate (TOPROL-XL) 50 MG 24 hr tablet Take 50 mg by mouth daily. Take with or immediately following a meal.    . oxyCODONE (OXY IR/ROXICODONE) 5 MG immediate release tablet Take 5 mg by mouth every 4 (four) hours as needed.    Marland Kitchen oxyCODONE-acetaminophen (PERCOCET) 5-325 MG per tablet Take 1 tablet by mouth every 6 (six) hours as needed. 15 tablet 0  . PARoxetine (PAXIL) 20 MG tablet Take 20 mg by mouth every morning.    . pravastatin (PRAVACHOL) 40 MG tablet Take 40 mg by mouth daily.    . SUMAtriptan (IMITREX) 100 MG tablet Take 100 mg by mouth daily.            Home: Home Living Living Arrangements: Spouse/significant other, Other (Comment) Available Help at Discharge: Family, Available 24 hours/day Type of Home: House Home Access: Stairs to enter [employer] of Steps:  (3 steps) Entrance Stairs-Rails: Right, Left, Can reach both Home Layout: Two level, Able to live on main level with bedroom/bathroom Bathroom Shower/Tub: Tub/shower unit, Industrial/product designer: Yes  Lives With: Spouse, Son, Other (Comment)   Functional History: Prior Function Level of Independence: Independent  Functional Status:  Mobility: Max assist to roll to left in bed Max to total assist to roll to right Max assist for supine to sitting Able to sit EOB  for 10-12 minutes with supervision to min assist. Total assist +2 for transfers to chair.    ADL: Moderate assist for upper body bathing  Max assist for lower body bathing. Max assist for UB dressing and dependent for LB dressing.   Cognition: Able to express basic needs with Y/N questions.     Physical Exam: Height 5' 10"  (1.778 m), weight 86.183 kg (190 lb). Physical Exam  Nursing note and vitals reviewed. Constitutional: She appears well-developed and well-nourished. No distress.  Sitting up in bed making humming sounds. Air loss noted via covered trach stoma.   HENT:  Head: Normocephalic and atraumatic.  Mouth/Throat: Oropharynx is clear and moist.  White coating on tongue. Missing upper teeth and multiple lower teeth.   Eyes: Conjunctivae and EOM are normal. Pupils are equal, round, and reactive to light.  Neck: Normal range of motion. Neck supple.  Cardiovascular: Normal rate and regular rhythm.  Exam reveals no friction rub.   Respiratory: Effort normal. No respiratory distress. She has wheezes.  GI: Soft. Bowel sounds are normal. She exhibits no distension. There  is no tenderness.  PEG site clean and dry without drainage.   Musculoskeletal: She exhibits no edema or tenderness.  Neurological: She is alert.  Did not make attempts at phonation.   Perseverative behaviors noted.   Apraxia noted.   Inconsistently follows commands with perseveration Dense right hemiparesis  Lacks insight and awareness of deficits.  Unable to perform motor and sensory testing, however appears to >/ 3/5 in LUE and LLE. DTRs 3+ on right side Neg Clonus, Hoffman's   Skin: Skin is warm and dry. No rash noted. She is not diaphoretic. No erythema.  Psychiatric: She is not slowed and not withdrawn. Cognition and memory are impaired. She is noncommunicative. She is inattentive.    Results for orders placed or performed during the hospital encounter of 04/27/15 (from the past 48 hour(s))  CBC      Status: Abnormal   Collection Time: 05/15/15  5:14 AM  Result Value Ref Range   WBC 9.7 4.0 - 10.5 K/uL   RBC 4.12 3.87 - 5.11 MIL/uL   Hemoglobin 11.7 (L) 12.0 - 15.0 g/dL   HCT 36.7 36.0 - 46.0 %   MCV 89.1 78.0 - 100.0 fL   MCH 28.4 26.0 - 34.0 pg   MCHC 31.9 30.0 - 36.0 g/dL   RDW 14.3 11.5 - 15.5 %   Platelets 231 150 - 400 K/uL  Basic metabolic panel     Status: Abnormal   Collection Time: 05/15/15  5:14 AM  Result Value Ref Range   Sodium 139 135 - 145 mmol/L   Potassium 4.9 3.5 - 5.1 mmol/L   Chloride 99 (L) 101 - 111 mmol/L   CO2 30 22 - 32 mmol/L   Glucose, Bld 151 (H) 65 - 99 mg/dL   BUN 37 (H) 6 - 20 mg/dL   Creatinine, Ser 0.64 0.44 - 1.00 mg/dL   Calcium 9.8 8.9 - 10.3 mg/dL   GFR calc non Af Amer >60 >60 mL/min   GFR calc Af Amer >60 >60 mL/min    Comment: (NOTE) The eGFR has been calculated using the CKD EPI equation. This calculation has not been validated in all clinical situations. eGFR's persistently <60 mL/min signify possible Chronic Kidney Disease.    Anion gap 10 5 - 15   Dg Chest Port 1 View  05/14/2015  CLINICAL DATA:  Respiratory failure. Tracheostomy. Status post decannulation. EXAM: PORTABLE CHEST 1 VIEW COMPARISON:  04/27/2015 FINDINGS: Tracheostomy tube and right arm PICC line have been removed. The patient is partially rotated to the right. Both lungs are clear. No evidence of pneumothorax or pleural effusion. Heart size is stable. IMPRESSION: No active disease. Electronically Signed   By: Earle Gell M.D.   On: 05/14/2015 13:53   Medical Problem List and Plan: 1. Functional deficits secondary to Left MCA infarct 2.  DVT Prophylaxis/Anticoagulation: Pharmaceutical: Lovenox 3. Chronic DDD/Fibromyalgia/Pain Management: Used oxycodone prn at home per family.  Continue tylenol prn for now 4. Mood: LCSW to follow for evaluation and support as mentation improves.  5. Neuropsych: This patient is not capable of making decisions on her own behalf. 6.  Skin/Wound Care: Routine pressure relief measures 7. Fluids/Electrolytes/Nutrition: Monitor I/O and hold continuous tube feeds to allow for appetite to improve. Will check calorie count to monitor intake.  Continue prosource supplements. Water flushes qid per PEG. 8. HTN: Monitor BP bid. On Lisinopril, hydralazine and catapres. 9. DM type 2: Family reports non-compliance with insulin at home. Will monitor BS ac/hs. Continue lantus bid  with glipizide BID. Use SSI for elevated BS and titrate lantus as indicated.  10. Anxiety disorder: Use Paxil and ativan at home. Monitor as awareness improves.  11.  Acute pre-renal azotemia: increase water flushes to qid.   Post Admission Physician Evaluation: 1. Functional deficits secondary  to left MCA infarct. 2. Patient is admitted to receive collaborative, interdisciplinary care between the physiatrist, rehab nursing staff, and therapy team. 3. Patient's level of medical complexity and substantial therapy needs in context of that medical necessity cannot be provided at a lesser intensity of care such as a SNF. 4. Patient has experienced substantial functional loss from his/her baseline which was documented above under the "Functional History" and "Functional Status" headings.  Judging by the patient's diagnosis, physical exam, and functional history, the patient has potential for functional progress which will result in measurable gains while on inpatient rehab.  These gains will be of substantial and practical use upon discharge  in facilitating mobility and self-care at the household level. 5. Physiatrist will provide 24 hour management of medical needs as well as oversight of the therapy plan/treatment and provide guidance as appropriate regarding the interaction of the two. 6. 24 hour rehab nursing will assist with bladder management, bowel management, safety, skin/wound care, disease management, medication administration and patient education and help  integrate therapy concepts, techniques,education, etc. 7. PT will assess and treat for/with: Lower extremity strength, range of motion, stamina, balance, functional mobility, safety, adaptive techniques and equipment, woundcare, coping skills, pain control, education.   Goals are: Min/Mod A. 8. OT will assess and treat for/with: ADL's, functional mobility, safety, upper extremity strength, adaptive techniques and equipment, wound mgt, ego support, and community reintegration.   Goals are: Min/Mod A. Therapy may proceed with showering this patient. 9. SLP will assess and treat for/with: language, speech, cognition, higher level processing, swallowing.  Goals are: supervision/Min A. 10. Case Management and Social Worker will assess and treat for psychological issues and discharge planning. 11. Team conference will be held weekly to assess progress toward goals and to determine barriers to discharge. 12. Patient will receive at least 3 hours of therapy per day at least 5 days per week. 13. ELOS: 19-22 days.        14. Prognosis:  good and fair  Delice Lesch, MD  05/16/2015

## 2015-05-16 NOTE — Progress Notes (Signed)
Kerry [name], MD Physician Signed Physical Medicine and Rehabilitation PMR Pre-admission 05/16/2015 11:41 AM  Related encounter: Admission (Discharged) from 04/27/2015 in [facility] PMR Admission Coordinator Pre-Admission Assessment  Patient: Kerry [name] is an 59 y.o., female MRN: [id number] DOB: 25-Jul-1955 Height: 5\' 10"  (177.8 cm) Weight: 86.183 kg (190 lb)  Insurance Information HMO: yes PPO: PCP: IPA: 80/20: OTHER: compass navigate PRIMARY: [place] Policy#: [id number] Subscriber: pt CM Name: Kerry [name] Phone#: [phone] Fax#: [phone] Pre-Cert#: [id number] Employer: unemployed approved for 7 days and then f/u will Kerry [name]  Benefits: Phone #: [phone] Name: 05/16/15 Eff. Date: 06/23/14 Deduct: $3600 Out of Pocket Max: $5000 Life Max: none CIR: 80% SNF: 80% 60 days Outpatient: $20 copay per visit Co-Pay: 23 visits PT, 23 visits OT, 30 visits SLP Home Health: 80% Co-Pay: 20% no visit limits DME: 80% Co-Pay: 20% Providers: in network  SECONDARY: none   Medicaid Application Date: Case Manager:  Disability Application Date: Case Worker:   Emergency Contact Information Kerry [name] spouse [phone] Kerry [name] [phone]  Current Medical History  Patient Admitting Diagnosis: CVA  History of Present Illness: 59 yo female with a history of HTN, CAD, DM type 2, breast cancer who was admitted on 04/11/15 to [facility] with right facial and right sided weakness. CT showed L MCA sign and TPA given but she had worsening of right hemiparesis with lethargy. Repeat CT negative for bleed and she underwent cerebral angio showed atherosclerotic occlusion of L ICA and was treated with embolectomy and stent placement and TICI revascularization. Post  procedure was vent dependent and developed [name] patchy areas of hemorrhage with acute large L MCA infarct as well as cerebral edema. She was treated with hypertonic saline for edema management. Malignant HTN treated with cardene drip and cardiology consulted for assistance with severe bradycardia with periods of asystole. Temporary pacemaker placed 10/21 and removed 10/24 once HR stabilized. PEG and Trach placed by Dr. Hulen Skains at bedside 04/18/15. Pt switched from ASA to plavix for carotid stenting after procedure. MSSA RLL HCAP with mucomyst added and albuterol per pulmonary consultation. Treated also with lasix, Ceftaz and Vanc. Respiratory status remained tenuous with secretions and pt transferred to [facility] on 04/27/15.  Upon admit to [facility] pt was on FIO2 of 28 % and treated for PNA with IV antibiotics, now PNA resolved. Pt placed on Plavix and added on fish oil. Patient decannualted on 05/14/15. Pt now on room air with sats 95%. Pt continues with PEG tube feeds. SLP evaluated pt on 11/21 and pt placed on dysphagia 1 diet with honey thick liquids. Pt maintained on sliding scale insulin and glipizide. Hgb A1c 11.4. Continued on statin for hyperlipidemia. For ETOH pt placed on thiamine, folic acid and vitamins. For tobacco abuse pt placed on nicotine patch.   Patient's medical record from [facility] has been reviewed by the rehabilitation admission coordinator and physician. 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 Cancer Mother     Breast cancer (right)  . Diabetes Sister   . Cancer Maternal Aunt     Breast cancer  . Cancer Maternal Aunt     Breast  cancer  . Cancer Cousin  Breast Cancer            Prior Rehab/Hospitalizations Has the patient had major surgery during 100 days prior to admission? Yes   Current Medications accucheck q 6 with humalog sliding scale amiodipine lipitor Clonidine plavix pepcid Fish oil Folic acid Glipizide Hydralazine lantus insulin Lisinopril lovenox Nicotine patch Pro-stat thera-m plus thiamine Zinc sulfate  Patients Current Diet: Dysphagia 1 diet with honey thick liquids  Precautions / Restrictions Precautions Precautions/Special Needs: Swallowing   Has the patient had 2 or more falls or a fall with injury in the past year?No  Prior Activity Level Community (5-7x/wk): Independent and self sufficitent pta  Prior Functional Level Self Care: Did the patient need help bathing, dressing, using the toilet or eating? Independent  Indoor Mobility: Did the patient need assistance with walking from room to room (with or without device)? Independent  Stairs: Did the patient need assistance with internal or external stairs (with or without device)? Independent  Functional Cognition: Did the patient need help planning regular tasks such as shopping or remembering to take medications? Independent  Home Assistive Devices / Equipment Home Assistive Devices/Equipment: None  Prior Device Use: Indicate devices/aids used by the patient prior to current illness, exacerbation or injury? None of the above   Prior Functional Level Current Functional Level  Bed Mobility  Independent  Max assist rolling   Transfers  Independent  Total assist stand pivot transfers to chair   Mobility - Walk/Wheelchair  Independent  Other no ambulation to pivot to chair   Upper Body Dressing  Independent  Mod assist   Lower Body Dressing  Independent  Total assist   Grooming  Independent  Mod assist pt right hand dominant     Eating/Drinking  Independent  Min assist   Toilet Transfer  Independent  Total assist   Bladder Continence   continent (continent)  incontinent (incontinent)   Bowel Management  continent  incontinent   Stair Climbing  independent  Other not attempted   Communication  independent  minimal articulation. decannulated 11/21. Some automatic one word verbalization   Memory  intact   (mod preseveration during tasks)   Cooking/Meal Prep  did self     Housework  did self    Money Management       Driving  Yes pta      Special needs/care consideration BiPAP/CPAP no CPM no Continuous Drip IV no Dialysis no  Life Vest no Oxygen room air Special Bed no Trach Size decannulated 11/21 Wound Vac no  Skin barrier cream to buttocks due to incontinent Bowel mgmt: incontinent Bladder mgmt: incontinent Diabetic mgmt Hgb A1c 11.4  Previous Home Environment Living Arrangements: Spouse/significant other, Other (Comment) Lives With: Spouse, Son, Other (Comment) Available Help at Discharge: Family, Available 24 hours/day Type of Home: House Home Layout: Two level, Able to live on main level with bedroom/bathroom Home Access: Stairs to enter Entrance Stairs-Rails: Right, Left, Can reach both Entrance Stairs-Number of Steps: (3 steps) Bathroom Shower/Tub: Tub/shower unit, Architectural technologist: Standard Bathroom Accessibility: Yes How Accessible: Accessible via walker Home Care Services: No Pt's son, [name] and his girlfriend live with parents. [name] is unemployed.  Discharge Living Setting Plans for Discharge Living Setting: Patient's home, Lives with (comment) Type of Home at Discharge: House Discharge Home Layout: Two level, Able to live on main level with bedroom/bathroom Discharge Home Access: Stairs to enter Entrance Stairs-Rails: Right, Left, Can reach both Entrance Stairs-Number of Steps: (3  steps) Discharge Bathroom Shower/Tub: Tub/shower  unit, Curtain Discharge Bathroom Toilet: Standard Discharge Bathroom Accessibility: Yes How Accessible: Accessible via walker Does the patient have any problems obtaining your medications?: No  Social/Family/Support Systems Patient Roles: Spouse, Parent Contact Information: (Husband, [name] and main contact is son, Public librarian) Anticipated Caregiver: [name], son, [name], and son, [name] as well as Jeremiah's g) Anticipated Ambulance person Information: (see above) Ability/Limitations of Caregiver: (spouse was working, but has quit his job since Psychologist, occupational) Caregiver Availability: 24/7 Discharge Plan Discussed with Primary Caregiver: Yes Is Caregiver In Agreement with Plan?: Yes Does Caregiver/Family have Issues with Lodging/Transportation while Pt is in Rehab?: No Spouse did work part time pta but has quit his job since pt hospitalized. Matt, son, is main contact to coordinate family support. He is a Airline pilot and also works for [employer]  part time. [name] has to explain to pt's husband and his brother the overall plans and pt needs.  Goals/Additional Needs Patient/Family Goal for Rehab: (min to moderate amount PT, OT, and SLP. w/c level goals) Expected length of stay: ELOS 14- 20 days Dietary Needs: (PEG with glucerna feeds at 50 cc/hr) Pt/Family Agrees to Admission and willing to participate: Yes Program Orientation Provided & Reviewed with Pt/Caregiver Including Roles & Responsibilities: Yes  Patient Condition: I have reviewed records from [facility] and assessed pt at bedside. Patient will benefit from ongoing PT,OT and SLP with a coordinated team approach at inpatient acute rehabilitation. She is currently max to total assist transfers and overall adls. We will admit pt to inpt acute rehab today.  Preadmission Screen Completed By: Cleatrice Burke, 05/16/2015 12:22  PM ______________________________________________________________________  Discussed status with Dr. Posey Pronto on 05/16/2015 at 1218 and received telephone approval for admission today.  Admission Coordinator: Cleatrice Burke, time 1218 Date 05/16/2015   Assessment/Plan: Diagnosis: Late effect Left MCA infarct 1. Does the need for close, 24 hr/day Medical supervision in concert with the patient's rehab needs make it unreasonable for this patient to be served in a less intensive setting? Yes 2. Co-Morbidities requiring supervision/potential complications: HTN, CAD, DM type 2, breast cancer 3. Due to bladder management, bowel management, safety, skin/wound care, disease management, medication administration and patient education, does the patient require 24 hr/day rehab nursing? Yes 4. Does the patient require coordinated care of a physician, rehab nurse, PT (1-2 hrs/day, 5 days/week), OT (1-2 hrs/day, 5 days/week) and SLP (1-2 hrs/day, 5 days/week) to address physical and functional deficits in the context of the above medical diagnosis(es)? Yes Addressing deficits in the following areas: balance, endurance, locomotion, strength, transferring, bowel/bladder control, bathing, dressing, feeding, grooming, toileting, cognition, speech, language, swallowing and psychosocial support 5. Can the patient actively participate in an intensive therapy program of at least 3 hrs of therapy 5 days a week? Yes 6. The potential for patient to make measurable gains while on inpatient rehab is good 7. Anticipated functional outcomes upon discharge from inpatients are: min assist and mod assist PT, supervision, min assist and mod assist OT, supervision and min assist SLP 8. Estimated rehab length of stay to reach the above functional goals is: 19-22 days. 9. Does the patient have adequate social supports to accommodate these discharge functional goals? Yes 10. Anticipated D/C setting:  Home 11. Anticipated post D/C treatments: HH therapy and Home excercise program 12. Overall Rehab/Functional Prognosis: good and fair    RECOMMENDATIONS: This patient's condition is appropriate for continued rehabilitative care in the following setting: CIR Patient has agreed to participate in recommended program. Yes Note that insurance prior authorization  may be required for reimbursement for recommended care.  Comment: Rehab Admissions Coordinator to follow up.  Cleatrice Burke 05/16/2015  Delice Lesch, MD      Revision History     Date/Time User Provider Type Action   05/16/2015 1:14 PM Kerry [name], MD Physician Sign   05/16/2015 12:22 PM Cristina Gong, RN Rehab Admission Coordinator Share   View Details Report

## 2015-05-16 NOTE — H&P (View-Only) (Signed)
Physical Medicine and Rehabilitation Admission H&P    CC: Right sided weakness, dysphagia, aphasia,    HPI:  Kerry [name] is a 59 y.o. female with history of breast cancer, HTN, CAD, chronic pain, DM type 2, medication non-compliance who was admitted on 04/11/15 with difficulty speaking, right facial and right sided weakness. CT showed dense L-MCA sign and TPA started but she had worsening of right hemiparesis with lethargy. Repeat CT negative for bleed and she underwent cerebral angio showed atherosclerotic occlusion of L-ICA and was treated with embolectomy and stent placement and TICI revascularization. Post procedure developed acute respiratory failure with dense right hemiparesis with global aphasia. She was vent dependent and developed small patchy areas of hemorrhage within acuteLarge L-MCA infarct as well as cerebral edema. She was started on hypertonic saline for edema management. Malignant HTN treated with cardene drip and cardiology consulted for assistance with severe bradycardia with periods of asystole. Temporary pacemaker placed by Dr. Martinique on 10/21. She continued to have lethargy with decrease in LOC requiring PEG placement by Dr. Hulen Skains and Lurline Idol with BAL performed by Dr Nelda Marseille. She developed MSSA PNA that was treated with IV antibiotics and required IV diuresis for CHF.  She was weaned to trach collar but continued to have significant secretions requiring frequent suctioning.  She was discharged to [facility] on 11/02 for medical management and rehab.  Respiratory status has been stable and she was decannulated on 11/21.  MBS done 11/14 and feeding trials initiated. She was started on dysphagia 1, honey liquids on 11/21 and tube feeds ongoing as supplement.  She continues on plavix for secondary stroke prevention and is tolerating this without SE. She has been showing improvement in activity tolerance and has been tolerating therapy. Patient with resultant right hemiparesis with  sensory deficits, expressive > receptive aphasia with apraxia and dysphagia.  She was felt to be a good candidate for intensive rehab program and CIR was recommended for follow up therapy.   Per Family: Review of Systems  Unable to perform ROS: patient nonverbal    Past Medical History: Hypertension    . Coronary artery disease   . Diabetes mellitus   . Breast cancer ([place]) 03/10/2007    Left breat  . Degenerative disc disease, cervical   . Degenerative disc disease, lumbar   . Fibromyalgia   . Hyperlipidemia   . Carpal tunnel syndrome on right          Past Surgical History:  Procedure Laterality Date  . Cardiac surgery    . Appendectomy    . Breast surgery    . Mastectomy Left 03/2007  . Cardiac catheterization  2005  . Vein surgery Right 2005  . Tubal ligation Right    Percutaneous feeding tube  04/18/15   Tracheostomy   04/18/15  . Radiology with anesthesia N/A 04/11/2015    Procedure: RADIOLOGY WITH ANESTHESIA; Surgeon: Luanne Bras, MD; Location: [place]; Service: Radiology; Laterality: N/A;  . Cardiac catheterization N/A 04/13/2015    Procedure: Temporary Pacemaker; Surgeon: Peter M Martinique, MD; Location: [place] [facility]; Service: Cardiovascular; Laterality: N/A         Family History:  Problem Relation Age of Onset  . Cancer Mother     Breast cancer (right)  . Diabetes Sister   . Cancer Maternal Aunt     Breast cancer  . Cancer Maternal Aunt     Breast cancer  . Cancer Cousin     Breast Cancer  Social History:  Married. Independent PTA and has been a homemaker. Family reports that she has been smoking--1/2 to 1 PPD. She has never used smokeless tobacco. Per reports that she drinks alcohol occasionally. Per reports that she does not use illicit drugs.    Allergies:  Allergies  Allergen Reactions  . Codeine      Nausea?-- "made her sick"      Medications Prior to admission: Medication Sig Dispense Refill  . LISINOPRIL PO Take 20 mg by mouth daily.     Marland Kitchen LORazepam (ATIVAN) 1 MG tablet Take 1 mg by mouth at bedtime.    Marland Kitchen amLODipine (NORVASC) 10 MG tablet Take 10 mg by mouth daily.    . cloNIDine (CATAPRES) 0.1 MG tablet Take 0.1 mg by mouth 2 (two) times daily.    Marland Kitchen glipiZIDE (GLUCOTROL XL) 10 MG 24 hr tablet Take 10 mg by mouth daily.    . insulin glargine (LANTUS) 100 UNIT/ML injection Inject 30 Units into the skin at bedtime.     . metoprolol succinate (TOPROL-XL) 50 MG 24 hr tablet Take 50 mg by mouth daily. Take with or immediately following a meal.    . oxyCODONE (OXY IR/ROXICODONE) 5 MG immediate release tablet Take 5 mg by mouth every 4 (four) hours as needed.    Marland Kitchen oxyCODONE-acetaminophen (PERCOCET) 5-325 MG per tablet Take 1 tablet by mouth every 6 (six) hours as needed. 15 tablet 0  . PARoxetine (PAXIL) 20 MG tablet Take 20 mg by mouth every morning.    . pravastatin (PRAVACHOL) 40 MG tablet Take 40 mg by mouth daily.    . SUMAtriptan (IMITREX) 100 MG tablet Take 100 mg by mouth daily.            Home: Home Living Living Arrangements: Spouse/significant other, Other (Comment) Available Help at Discharge: Family, Available 24 hours/day Type of Home: House Home Access: Stairs to enter [employer] of Steps:  (3 steps) Entrance Stairs-Rails: Right, Left, Can reach both Home Layout: Two level, Able to live on main level with bedroom/bathroom Bathroom Shower/Tub: Tub/shower unit, Industrial/product designer: Yes  Lives With: Spouse, Son, Other (Comment)   Functional History: Prior Function Level of Independence: Independent  Functional Status:  Mobility: Max assist to roll to left in bed Max to total assist to roll to right Max assist for supine to sitting Able to sit EOB  for 10-12 minutes with supervision to min assist. Total assist +2 for transfers to chair.    ADL: Moderate assist for upper body bathing  Max assist for lower body bathing. Max assist for UB dressing and dependent for LB dressing.   Cognition: Able to express basic needs with Y/N questions.     Physical Exam: Height 5' 10"  (1.778 m), weight 86.183 kg (190 lb). Physical Exam  Nursing note and vitals reviewed. Constitutional: She appears well-developed and well-nourished. No distress.  Sitting up in bed making humming sounds. Air loss noted via covered trach stoma.   HENT:  Head: Normocephalic and atraumatic.  Mouth/Throat: Oropharynx is clear and moist.  White coating on tongue. Missing upper teeth and multiple lower teeth.   Eyes: Conjunctivae and EOM are normal. Pupils are equal, round, and reactive to light.  Neck: Normal range of motion. Neck supple.  Cardiovascular: Normal rate and regular rhythm.  Exam reveals no friction rub.   Respiratory: Effort normal. No respiratory distress. She has wheezes.  GI: Soft. Bowel sounds are normal. She exhibits no distension. There  is no tenderness.  PEG site clean and dry without drainage.   Musculoskeletal: She exhibits no edema or tenderness.  Neurological: She is alert.  Did not make attempts at phonation.   Perseverative behaviors noted.   Apraxia noted.   Inconsistently follows commands with perseveration Dense right hemiparesis  Lacks insight and awareness of deficits.  Unable to perform motor and sensory testing, however appears to >/ 3/5 in LUE and LLE. DTRs 3+ on right side Neg Clonus, Hoffman's   Skin: Skin is warm and dry. No rash noted. She is not diaphoretic. No erythema.  Psychiatric: She is not slowed and not withdrawn. Cognition and memory are impaired. She is noncommunicative. She is inattentive.    Results for orders placed or performed during the hospital encounter of 04/27/15 (from the past 48 hour(s))  CBC      Status: Abnormal   Collection Time: 05/15/15  5:14 AM  Result Value Ref Range   WBC 9.7 4.0 - 10.5 K/uL   RBC 4.12 3.87 - 5.11 MIL/uL   Hemoglobin 11.7 (L) 12.0 - 15.0 g/dL   HCT 36.7 36.0 - 46.0 %   MCV 89.1 78.0 - 100.0 fL   MCH 28.4 26.0 - 34.0 pg   MCHC 31.9 30.0 - 36.0 g/dL   RDW 14.3 11.5 - 15.5 %   Platelets 231 150 - 400 K/uL  Basic metabolic panel     Status: Abnormal   Collection Time: 05/15/15  5:14 AM  Result Value Ref Range   Sodium 139 135 - 145 mmol/L   Potassium 4.9 3.5 - 5.1 mmol/L   Chloride 99 (L) 101 - 111 mmol/L   CO2 30 22 - 32 mmol/L   Glucose, Bld 151 (H) 65 - 99 mg/dL   BUN 37 (H) 6 - 20 mg/dL   Creatinine, Ser 0.64 0.44 - 1.00 mg/dL   Calcium 9.8 8.9 - 10.3 mg/dL   GFR calc non Af Amer >60 >60 mL/min   GFR calc Af Amer >60 >60 mL/min    Comment: (NOTE) The eGFR has been calculated using the CKD EPI equation. This calculation has not been validated in all clinical situations. eGFR's persistently <60 mL/min signify possible Chronic Kidney Disease.    Anion gap 10 5 - 15   Dg Chest Port 1 View  05/14/2015  CLINICAL DATA:  Respiratory failure. Tracheostomy. Status post decannulation. EXAM: PORTABLE CHEST 1 VIEW COMPARISON:  04/27/2015 FINDINGS: Tracheostomy tube and right arm PICC line have been removed. The patient is partially rotated to the right. Both lungs are clear. No evidence of pneumothorax or pleural effusion. Heart size is stable. IMPRESSION: No active disease. Electronically Signed   By: Earle Gell M.D.   On: 05/14/2015 13:53   Medical Problem List and Plan: 1. Functional deficits secondary to Left MCA infarct 2.  DVT Prophylaxis/Anticoagulation: Pharmaceutical: Lovenox 3. Chronic DDD/Fibromyalgia/Pain Management: Used oxycodone prn at home per family.  Continue tylenol prn for now 4. Mood: LCSW to follow for evaluation and support as mentation improves.  5. Neuropsych: This patient is not capable of making decisions on her own behalf. 6.  Skin/Wound Care: Routine pressure relief measures 7. Fluids/Electrolytes/Nutrition: Monitor I/O and hold continuous tube feeds to allow for appetite to improve. Will check calorie count to monitor intake.  Continue prosource supplements. Water flushes qid per PEG. 8. HTN: Monitor BP bid. On Lisinopril, hydralazine and catapres. 9. DM type 2: Family reports non-compliance with insulin at home. Will monitor BS ac/hs. Continue lantus bid  with glipizide BID. Use SSI for elevated BS and titrate lantus as indicated.  10. Anxiety disorder: Use Paxil and ativan at home. Monitor as awareness improves.  11.  Acute pre-renal azotemia: increase water flushes to qid.   Post Admission Physician Evaluation: 1. Functional deficits secondary  to left MCA infarct. 2. Patient is admitted to receive collaborative, interdisciplinary care between the physiatrist, rehab nursing staff, and therapy team. 3. Patient's level of medical complexity and substantial therapy needs in context of that medical necessity cannot be provided at a lesser intensity of care such as a SNF. 4. Patient has experienced substantial functional loss from his/her baseline which was documented above under the "Functional History" and "Functional Status" headings.  Judging by the patient's diagnosis, physical exam, and functional history, the patient has potential for functional progress which will result in measurable gains while on inpatient rehab.  These gains will be of substantial and practical use upon discharge  in facilitating mobility and self-care at the household level. 5. Physiatrist will provide 24 hour management of medical needs as well as oversight of the therapy plan/treatment and provide guidance as appropriate regarding the interaction of the two. 6. 24 hour rehab nursing will assist with bladder management, bowel management, safety, skin/wound care, disease management, medication administration and patient education and help  integrate therapy concepts, techniques,education, etc. 7. PT will assess and treat for/with: Lower extremity strength, range of motion, stamina, balance, functional mobility, safety, adaptive techniques and equipment, woundcare, coping skills, pain control, education.   Goals are: Min/Mod A. 8. OT will assess and treat for/with: ADL's, functional mobility, safety, upper extremity strength, adaptive techniques and equipment, wound mgt, ego support, and community reintegration.   Goals are: Min/Mod A. Therapy may proceed with showering this patient. 9. SLP will assess and treat for/with: language, speech, cognition, higher level processing, swallowing.  Goals are: supervision/Min A. 10. Case Management and Social Worker will assess and treat for psychological issues and discharge planning. 11. Team conference will be held weekly to assess progress toward goals and to determine barriers to discharge. 12. Patient will receive at least 3 hours of therapy per day at least 5 days per week. 13. ELOS: 19-22 days.        14. Prognosis:  good and fair  Delice Lesch, MD  05/16/2015

## 2015-05-16 NOTE — PMR Pre-admission (Signed)
[facility] PMR Admission Coordinator Pre-Admission Assessment  Patient: Kerry [name] is an 59 y.o., female MRN: [id number] DOB: [birth year] Height: 5\' 10"  (177.8 cm) Weight: 86.183 kg (190 lb)  Insurance Information HMO: yes    PPO:      PCP:      IPA:      80/20:      OTHER: compass navigate PRIMARY: [place]      Policy#: [id number]      Subscriber: pt CM Name: [name]      Phone#: [phone]     Fax#: [phone] Pre-Cert#: [id number]      Employer: unemployed approved for 7 days and then f/u will [name]  Benefits:  Phone #: [phone]     Name: 05/16/15 Eff. Date: 06/23/14     Deduct: $3600      Out of Pocket Max: $5000      Life Max: none CIR: 80%      SNF: 80% 60 days Outpatient: $20 copay per visit     Co-Pay: 23 visits PT, 23 visits OT, 30 visits SLP Home Health: 80%      Co-Pay: 20% no visit limits DME: 80%     Co-Pay: 20% Providers: in network  SECONDARY: none        Medicaid Application Date:       Case Manager:  Disability Application Date:       Case Worker:   Emergency Contact Information Rodneshia Petak spouse  [phone] Anjulie Peeler son  [phone]  Current Medical History  Patient Admitting Diagnosis: CVA  History of Present Illness: 59 yo female with a history of HTN, CAD, DM type 2, breast cancer who was admitted on 04/11/15 to [facility] with right facial and right sided weakness. CT showed L MCA sign and TPA given but she had worsening of right hemiparesis with lethargy. Repeat CT negative for bleed and she underwent cerebral angio showed atherosclerotic occlusion of L ICA and was treated with embolectomy and stent placement and TICI revascularization. Post procedure was vent dependent and developed small patchy areas of hemorrhage with acute large L MCA infarct as well as cerebral edema. She was treated with hypertonic saline for edema management. Malignant HTN treated with cardene drip and cardiology consulted  for assistance with severe bradycardia with periods of asystole. Temporary pacemaker placed 10/21 and removed 10/24 once HR stabilized. PEG and Trach placed by Dr. Hulen Skains at bedside 04/18/15. Pt switched from ASA to plavix for carotid stenting after procedure. MSSA RLL HCAP with mucomyst added and albuterol per pulmonary consultation. Treated also with lasix, Ceftaz and Vanc. Respiratory status remained tenuous with secretions and pt transferred to [facility] on 04/27/15.  Upon admit to [facility] pt was on FIO2 of 28 % and treated for PNA with IV antibiotics, now PNA resolved. Pt placed on Plavix and added on fish oil. Patient decannualted on 05/14/15. Pt now on room air with sats 95%. Pt continues with PEG tube feeds. SLP evaluated pt on 11/21 and pt placed on dysphagia 1 diet with honey thick liquids. Pt maintained on sliding scale insulin and glipizide. Hgb A1c 11.4. Continued on statin for hyperlipidemia. For ETOH pt placed on thiamine, folic acid and  vitamins. For tobacco abuse pt placed on nicotine patch.   Patient's [facility] has been reviewed by the rehabilitation admission coordinator and physician. NIH Stroke scale: Glascow Coma Scale:  Past Medical History  HTN CAD DM Breast Cancer Degenerative cervical disc disease  Degenerative lumbar disc disease fibromylagia Hyperlipidemia Carpal tunnel syndrome on the right Cardiac surgery Appendectomy Mastectomy Vein surgery Tubal ligation  Family History   . Cancer Mother     Breast cancer (right)  . Diabetes Sister   . Cancer Maternal Aunt     Breast cancer  . Cancer Maternal Aunt     Breast cancer  . Cancer Cousin     Breast Cancer            Prior Rehab/Hospitalizations Has the patient had major surgery during 100 days prior to admission? Yes    Current Medications accucheck q 6 with humalog sliding  scale amiodipine lipitor Clonidine plavix pepcid Fish oil Folic acid Glipizide Hydralazine lantus insulin Lisinopril lovenox Nicotine patch Pro-stat thera-m plus  thiamine Zinc sulfate  Patients Current Diet:  Dysphagia 1 diet with honey thick liquids  Precautions / Restrictions Precautions Precautions/Special Needs: Swallowing   Has the patient had 2 or more falls or a fall with injury in the past year?No  Prior Activity Level Community (5-7x/wk): Independent and self sufficitent pta  Prior Functional Level Self Care: Did the patient need help bathing, dressing, using the toilet or eating?  Independent  Indoor Mobility: Did the patient need assistance with walking from room to room (with or without device)? Independent  Stairs: Did the patient need assistance with internal or external stairs (with or without device)? Independent  Functional Cognition: Did the patient need help planning regular tasks such as shopping or remembering to take medications? Independent  Home Assistive Devices / Equipment Home Assistive Devices/Equipment: None  Prior Device Use: Indicate devices/aids used by the patient prior to current illness, exacerbation or injury? None of the above   Prior Functional Level Current Functional Level  Bed Mobility  Independent  Max assist rolling   Transfers  Independent  Total assist stand pivot transfers to chair   Mobility - Walk/Wheelchair  Independent  Other no ambulation to pivot to chair   Upper Body Dressing  Independent  Mod assist   Lower Body Dressing  Independent  Total assist   Grooming  Independent  Mod assist pt right hand dominant   Eating/Drinking  Independent  Min assist   Toilet Transfer  Independent  Total assist   Bladder Continence   continent (continent)  incontinent (incontinent)   Bowel Management  continent  incontinent   Stair Climbing  independent  Other not attempted   Communication   independent  minimal articulation. decannulated 11/21. Some automatic one word verbalization   Memory  intact   (mod preseveration during tasks)   Cooking/Meal Prep  did self      Housework  did self    Money Management       Driving  Yes pta      Special needs/care consideration BiPAP/CPAP no CPM no Continuous Drip IV no Dialysis no         Life Vest no Oxygen room air Special Bed no Trach Size decannulated 11/21 Wound Vac no       Skin barrier cream to buttocks due to incontinent Bowel mgmt: incontinent Bladder mgmt: incontinent Diabetic mgmt Hgb A1c 11.4  Previous Home Environment Living Arrangements: Spouse/significant other, Other (Comment)  Lives With: Spouse, Son, Other (Comment) Available Help at Discharge: Family, Available 24 hours/day Type of Home: House Home Layout: Two level, Able to live on main level with bedroom/bathroom Home Access: Stairs to enter Entrance Stairs-Rails: Right, Left, Can reach both Entrance Stairs-Number of Steps:  (3 steps)  Bathroom Shower/Tub: Public librarian, Architectural technologist: Standard Bathroom Accessibility: Yes How Accessible: Accessible via walker Home Care Services: No Pt's son, [name] and his girlfriend live with parents. [name] is unemployed.  Discharge Living Setting Plans for Discharge Living Setting: Patient's home, Lives with (comment) Type of Home at Discharge: House Discharge Home Layout: Two level, Able to live on main level with bedroom/bathroom Discharge Home Access: Stairs to enter Entrance Stairs-Rails: Right, Left, Can reach both Entrance Stairs-Number of Steps:  (3 steps) Discharge Bathroom Shower/Tub: Tub/shower unit, Curtain Discharge Bathroom Toilet: Standard Discharge Bathroom Accessibility: Yes How Accessible: Accessible via walker Does the patient have any problems obtaining your medications?: No  Social/Family/Support Systems Patient Roles: Spouse, Parent Contact Information:   (Husband, [name] and main contact is son, Public librarian) Anticipated Caregiver:  [name], son, [name], and son, [name] as well as Jeremiah's g) Anticipated Ambulance person Information:  (see above) Ability/Limitations of Caregiver:  (spouse was working, but has quit his job since Psychologist, occupational) Caregiver Availability: 24/7 Discharge Plan Discussed with Primary Caregiver: Yes Is Caregiver In Agreement with Plan?: Yes Does Caregiver/Family have Issues with Lodging/Transportation while Pt is in Rehab?: No Spouse did work part time pta but has quit his job since pt hospitalized. Matt, son, is main contact to coordinate family support. He is a Airline pilot and also works for [employer]  part time. [name] has to explain to pt's husband and his brother the overall plans and pt needs.  Goals/Additional Needs Patient/Family Goal for Rehab:  (min to moderate amount PT, OT, and SLP. w/c level goals) Expected length of stay: ELOS 14- 20 days Dietary Needs:  (PEG with glucerna feeds at 50 cc/hr) Pt/Family Agrees to Admission and willing to participate: Yes Program Orientation Provided & Reviewed with Pt/Caregiver Including Roles  & Responsibilities: Yes  Patient Condition: I have reviewed records from [facility] and assessed pt at bedside. Patient will benefit from ongoing PT,OT and SLP with a coordinated team approach at inpatient acute rehabilitation. She is currently max to total assist transfers and overall adls. We will admit pt to inpt acute rehab today.  Preadmission Screen Completed By:  Cleatrice Burke, 05/16/2015 12:22 PM ______________________________________________________________________   Discussed status with Dr. Posey Pronto  on  05/16/2015  at  1218 and received telephone approval for admission today.  Admission Coordinator:  Cleatrice Burke, time  1218 Date  05/16/2015   Assessment/Plan: Diagnosis: Late effect Left MCA infarct 1. Does the need for close, 24 hr/day  Medical  supervision in concert with the patient's rehab needs make it unreasonable for this patient to be served in a less intensive setting? Yes 2. Co-Morbidities requiring supervision/potential complications: HTN, CAD, DM type 2, breast cancer 3. Due to bladder management, bowel management, safety, skin/wound care, disease management, medication administration and patient education, does the patient require 24 hr/day rehab nursing? Yes 4. Does the patient require coordinated care of a physician, rehab nurse, PT (1-2 hrs/day, 5 days/week), OT (1-2 hrs/day, 5 days/week) and SLP (1-2 hrs/day, 5 days/week) to address physical and functional deficits in the context of the above medical diagnosis(es)? Yes Addressing deficits in the following areas: balance, endurance, locomotion, strength, transferring, bowel/bladder control, bathing, dressing, feeding, grooming, toileting, cognition, speech, language, swallowing and psychosocial support 5. Can the patient actively participate in an intensive therapy program of at least 3 hrs of therapy 5 days a week? Yes 6. The potential for patient to make measurable gains while on inpatient rehab is good 7. Anticipated functional outcomes upon discharge  from inpatients are: min assist and mod assist PT, supervision, min assist and mod assist OT, supervision and min assist SLP 8. Estimated rehab length of stay to reach the above functional goals is: 19-22 days. 9. Does the patient have adequate social supports to accommodate these discharge functional goals? Yes 10. Anticipated D/C setting: Home 11. Anticipated post D/C treatments: HH therapy and Home excercise program 12. Overall Rehab/Functional Prognosis: good and fair    RECOMMENDATIONS: This patient's condition is appropriate for continued rehabilitative care in the following setting: CIR Patient has agreed to participate in recommended program. Yes Note that insurance prior authorization may be required for  reimbursement for recommended care.  Comment: Rehab Admissions Coordinator to follow up.  Cleatrice Burke 05/16/2015  Delice Lesch, MD

## 2015-05-17 DIAGNOSIS — Z930 Tracheostomy status: Secondary | ICD-10-CM

## 2015-05-17 LAB — CBC WITH DIFFERENTIAL/PLATELET
Basophils Absolute: 0 10*3/uL (ref 0.0–0.1)
Basophils Relative: 0 %
Eosinophils Absolute: 0.2 10*3/uL (ref 0.0–0.7)
Eosinophils Relative: 2 %
HEMATOCRIT: 37 % (ref 36.0–46.0)
HEMOGLOBIN: 11.6 g/dL — AB (ref 12.0–15.0)
LYMPHS ABS: 3.8 10*3/uL (ref 0.7–4.0)
LYMPHS PCT: 40 %
MCH: 27.9 pg (ref 26.0–34.0)
MCHC: 31.4 g/dL (ref 30.0–36.0)
MCV: 88.9 fL (ref 78.0–100.0)
MONOS PCT: 5 %
Monocytes Absolute: 0.5 10*3/uL (ref 0.1–1.0)
NEUTROS ABS: 4.9 10*3/uL (ref 1.7–7.7)
NEUTROS PCT: 53 %
Platelets: 224 10*3/uL (ref 150–400)
RBC: 4.16 MIL/uL (ref 3.87–5.11)
RDW: 14.6 % (ref 11.5–15.5)
WBC: 9.4 10*3/uL (ref 4.0–10.5)

## 2015-05-17 LAB — GLUCOSE, CAPILLARY
GLUCOSE-CAPILLARY: 126 mg/dL — AB (ref 65–99)
GLUCOSE-CAPILLARY: 96 mg/dL (ref 65–99)
Glucose-Capillary: 127 mg/dL — ABNORMAL HIGH (ref 65–99)
Glucose-Capillary: 166 mg/dL — ABNORMAL HIGH (ref 65–99)

## 2015-05-17 LAB — COMPREHENSIVE METABOLIC PANEL
ALBUMIN: 3.2 g/dL — AB (ref 3.5–5.0)
ALK PHOS: 74 U/L (ref 38–126)
ALT: 31 U/L (ref 14–54)
ANION GAP: 9 (ref 5–15)
AST: 27 U/L (ref 15–41)
BILIRUBIN TOTAL: 0.4 mg/dL (ref 0.3–1.2)
BUN: 22 mg/dL — ABNORMAL HIGH (ref 6–20)
CALCIUM: 9.6 mg/dL (ref 8.9–10.3)
CO2: 27 mmol/L (ref 22–32)
Chloride: 101 mmol/L (ref 101–111)
Creatinine, Ser: 0.58 mg/dL (ref 0.44–1.00)
GFR calc Af Amer: >60 mL/min (ref >60)
GLUCOSE: 152 mg/dL — AB (ref 65–99)
Potassium: 4.9 mmol/L (ref 3.5–5.1)
Sodium: 137 mmol/L (ref 135–145)
TOTAL PROTEIN: 7.1 g/dL (ref 6.5–8.1)

## 2015-05-17 NOTE — Progress Notes (Signed)
[place] PHYSICAL MEDICINE & REHABILITATION     PROGRESS NOTE    Subjective/Complaints: Appears comfortable. Stridor with breathing per baseline. Eating breakfast currently. No issues overnight Unable to perform ROS due to aphasia  Objective: Vital Signs: Blood pressure 108/68, pulse 81, temperature 97.8 F (36.6 C), temperature source Oral, resp. rate 20, weight 80.196 kg (176 lb 12.8 oz), SpO2 98 %. No results found.  Recent Labs  05/15/15 0514 05/17/15 0452  WBC 9.7 9.4  HGB 11.7* 11.6*  HCT 36.7 37.0  PLT 231 224    Recent Labs  05/15/15 0514 05/17/15 0452  NA 139 137  K 4.9 4.9  CL 99* 101  GLUCOSE 151* 152*  BUN 37* 22*  CREATININE 0.64 0.58  CALCIUM 9.8 9.6   CBG (last 3)   Recent Labs  05/16/15 2244 05/17/15 0718  GLUCAP 183* 126*    Wt Readings from Last 3 Encounters:  05/16/15 80.196 kg (176 lb 12.8 oz)  05/16/15 86.183 kg (190 lb)  04/27/15 92.5 kg (203 lb 14.8 oz)    Physical Exam:  Constitutional: She appears well-developed and well-nourished. No distress.  Sitting up in bed and comfortable  HENT:  Head: Normocephalic and atraumatic.  Mouth/Throat: Oropharynx is clear and moist.  White coating on tongue still present. Missing upper teeth and multiple lower teeth.  Eyes: Conjunctivae and EOM are normal. Pupils are equal, round, and reactive to light.  Neck: Normal range of motion. Neck supple.  Cardiovascular: Normal rate and regular rhythm. Exam reveals no friction rub.  Respiratory: Effort normal. No respiratory distress. She has wheezes. Stridor, minimal air leakage today GI: Soft. Bowel sounds are normal. She exhibits no distension. There is no tenderness.  PEG site clean and dry without drainage.  Musculoskeletal: She exhibits no edema or tenderness.  Neurological: She is alert.  Did not make attempts at phonation.  Perseverative behaviors noted.  Apraxia noted. occasionally with an appropriate y/n nod. Poor oral-motor  control Inconsistently follows commands with perseveration Dense right hemiparesis  Lacks insight and awareness of deficits.  Unable to perform motor and sensory testing, however appears to >/ 3/5 in LUE and LLE. DTRs 3+ on right side    Skin: Skin is warm and dry. No rash noted. She is not diaphoretic. No erythema.  Psychiatric: She is not slowed and not withdrawn. Cognition and memory are impaired. She is noncommunicative. She is inattentive.     Assessment/Plan: 1. 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 Physiatrist is providing close team supervision and 24 hour management of active medical problems listed below. Physiatrist and rehab team continue to assess barriers to discharge/monitor patient progress toward functional and medical goals.  Function:  Bathing Bathing position      Bathing parts      Bathing assist        Upper Body Dressing/Undressing Upper body dressing                    Upper body assist        Lower Body Dressing/Undressing Lower body dressing                                  Lower body assist        Toileting Toileting          Toileting assist  Transfers Chair/bed Physiological scientist Comprehension Comprehension assist level: Other (comment) (nonverbal- unable to assess)  Expression Expression assist level: Expresses basis less than 25% of the time/requires cueing >75% of the time.  Social Interaction Social Interaction assist level: Interacts appropriately less than 25% of the time. May be withdrawn or combative.  Problem Solving Problem solving assist level: Solves basic 25 - 49% of the time - needs direction more than half the time to initiate, plan or complete simple activities  Memory Memory assist level:   (nonverbal- unable to assess)   Medical Problem List and Plan: 1. Functional deficits secondary to Left MCA infarct 2. DVT Prophylaxis/Anticoagulation: Pharmaceutical: Lovenox 3. Chronic DDD/Fibromyalgia/Pain Management: Used oxycodone prn at home per family. Continue tylenol prn for now  -appears comfortable 4. Mood: LCSW to follow for evaluation and support as mentation improves.  5. Neuropsych: This patient is not capable of making decisions on her own behalf. 6. Skin/Wound Care: Routine pressure relief measures 7. Fluids/Electrolytes/Nutrition: Monitor I/O and hold continuous tube feeds to allow for appetite to improve.  checking calorie count to monitor intake. Continue prosource supplements. Water flushes qid per PEG. 8. HTN: Monitor BP bid. On Lisinopril, hydralazine and catapres. 9. DM type 2: Family reports non-compliance with insulin at home. Will monitor BS ac/hs. Continue lantus bid with glipizide BID. Use SSI for elevated BS and titrate lantus as indicated.   -good control at present.cbg's reviewed 10. Anxiety disorder: Use Paxil and ativan at home. Monitor as awareness improves.  11. Acute pre-renal azotemia: increase water flushes to qid. --I personally reviewed the patient's labs today. Bun now 22 12. Hx VDRF: continue occlusive dressing to trach site--no leakage today  -high aspiration risk  -full supervision with meals, D1 honey   LOS (Days) 1 A FACE TO FACE EVALUATION WAS PERFORMED  SWARTZ,ZACHARY T 05/17/2015 7:39 AM

## 2015-05-18 ENCOUNTER — Inpatient Hospital Stay (HOSPITAL_COMMUNITY): Payer: 59 | Admitting: Occupational Therapy

## 2015-05-18 ENCOUNTER — Inpatient Hospital Stay (HOSPITAL_COMMUNITY): Payer: 59 | Admitting: Physical Therapy

## 2015-05-18 ENCOUNTER — Inpatient Hospital Stay (HOSPITAL_COMMUNITY): Payer: 59 | Admitting: Speech Pathology

## 2015-05-18 LAB — GLUCOSE, CAPILLARY
GLUCOSE-CAPILLARY: 132 mg/dL — AB (ref 65–99)
GLUCOSE-CAPILLARY: 151 mg/dL — AB (ref 65–99)
Glucose-Capillary: 133 mg/dL — ABNORMAL HIGH (ref 65–99)
Glucose-Capillary: 167 mg/dL — ABNORMAL HIGH (ref 65–99)
Glucose-Capillary: 120 mg/dL — ABNORMAL HIGH (ref 65–99)

## 2015-05-18 NOTE — Progress Notes (Signed)
Patient information reviewed and entered into eRehab system by Artavius Stearns, RN, CRRN, PPS Coordinator.  Information including medical coding and functional independence measure will be reviewed and updated through discharge.    

## 2015-05-18 NOTE — Evaluation (Signed)
Occupational Therapy Assessment and Plan  Patient Details  Name: Kerry [name] MRN: [id number] Date of Birth: Feb 07, 1956  OT Diagnosis: abnormal posture, apraxia, cognitive deficits, flaccid hemiplegia and hemiparesis, hemiplegia affecting dominant side and muscle weakness (generalized) Rehab Potential: Rehab Potential (ACUTE ONLY): Good ELOS: 21-25 days   Today's Date: 05/18/2015 OT Individual Time: 5361-4431 (60 mins)       Problem List:  Patient Active Problem List   Diagnosis Date Noted  . Stroke due to thrombosis of left middle cerebral artery ([place]) 05/16/2015  . Hemiparesis, aphasia, and dysphagia as late effect of cerebrovascular accident (CVA) ([place])   . Essential hypertension   . Uncontrolled type 2 diabetes mellitus with ketoacidosis without coma ([place])   . Coronary artery disease involving native coronary artery of native heart with angina pectoris ([place])   . History of breast cancer   . Fibromyalgia   . Chronic pain syndrome   . Generalized anxiety disorder   . Prerenal azotemia   . Pneumonia   . Chronic respiratory failure ([place])   . Respiratory failure ([place])   . Hypokalemia   . Compromised airway   . HLD (hyperlipidemia)   . Type 2 diabetes mellitus with circulatory disorder ([place])   . Sinus bradycardia   . Asystole ([place])   . Acute respiratory failure ([place])   . Cerebral hemorrhage ([place])   . Cytotoxic cerebral edema ([place])   . Sinus pause   . Cerebrovascular accident (CVA) due to occlusion of left middle cerebral artery ([place])   . Cerebrovascular accident (CVA) due to thrombosis of precerebral artery ([place])   . Encounter for central line placement   . Encounter for feeding tube placement   . CVA (cerebral infarction) 04/11/2015  . Stroke (cerebrum) ([place]) 04/11/2015  . Aphasia   . 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 Hypertensive urgency   . Uncontrolled type 2 diabetes mellitus with hyperosmolarity without coma, without long-term current use of  insulin ([place])   . Breast cancer ([place]) 11/08/2012    Past Medical History:  Past Medical History  Diagnosis Date  . Hypertension   . Coronary artery disease   . Diabetes mellitus   . Breast cancer ([place]) 03/10/2007    Left breat  . Degenerative disc disease, cervical   . Degenerative disc disease, lumbar   . Fibromyalgia   . Hyperlipidemia   . Carpal tunnel syndrome on right    Past Surgical History:  Past Surgical History  Procedure Laterality Date  . Cardiac surgery    . Appendectomy    . Breast surgery    . Mastectomy Left 03/2007  . Cardiac catheterization  2005  . Vein surgery Right 2005  . Tubal ligation Right   . Radiology with anesthesia N/A 04/11/2015    Procedure: RADIOLOGY WITH ANESTHESIA;  Surgeon: Luanne Bras, MD;  Location: [facility];  Service: Radiology;  Laterality: N/A;  . Cardiac catheterization N/A 04/13/2015    Procedure: Temporary Pacemaker;  Surgeon: Peter M Martinique, MD;  Location: [facility];  Service: Cardiovascular;  Laterality: N/A;  . Peg placement N/A 04/18/2015    Procedure: PERCUTANEOUS ENDOSCOPIC GASTROSTOMY (PEG) PLACEMENT;  Surgeon: Judeth Horn, MD;  Location: [facility];  Service: General;  Laterality: N/A;  bedside  . Esophagogastroduodenoscopy N/A 04/18/2015    Procedure: ESOPHAGOGASTRODUODENOSCOPY (EGD);  Surgeon: Judeth Horn, MD;  Location: [facility] ENDOSCOPY;  Service: General;  Laterality: N/A;    Assessment & Plan Clinical Impression: Patient is a 59 y.o. year old female  with history of breast cancer, HTN, CAD, chronic pain, DM type 2, medication non-compliance who was admitted on 04/11/15 with difficulty speaking, right facial and right sided weakness. CT showed dense L-MCA sign and TPA started but she had worsening of right hemiparesis with lethargy. Repeat CT negative for bleed and she underwent cerebral angio showed atherosclerotic occlusion of L-ICA and was treated with embolectomy and stent placement and TICI revascularization.  Post procedure developed acute respiratory failure with dense right hemiparesis with global aphasia. She was vent dependent and developed small patchy areas of hemorrhage within acuteLarge L-MCA infarct as well as cerebral edema. She was started on hypertonic saline for edema management. Malignant HTN treated with cardene drip and cardiology consulted for assistance with severe bradycardia with periods of asystole. Temporary pacemaker placed by Dr. Martinique on 10/21. She continued to have lethargy with decrease in LOC requiring PEG placement by Dr. Hulen Skains and Lurline Idol with BAL performed by Dr Nelda Marseille. She developed MSSA PNA that was treated with IV antibiotics and required IV diuresis for CHF. She was weaned to trach collar but continued to have significant secretions requiring frequent suctioning. She was [facility] to [facility] on [facility] for medical management and rehab. Respiratory status has been stable and she was decannulated on 11/21. MBS done 11/14 and feeding trials initiated. She was started on dysphagia 1, honey liquids on 11/21 and tube feeds ongoing as supplement. She continues on plavix for secondary stroke prevention and is tolerating this without SE. She has been showing improvement in activity tolerance and has been tolerating therapy. Patient with resultant right hemiparesis with sensory deficits, expressive > receptive aphasia with apraxia and dysphagia. She was felt to be a good candidate for intensive rehab program and CIR was recommended for follow up therapy.   .  Patient transferred to CIR on 05/16/2015 .    Patient currently requires Max assist +2 with basic self-care skills secondary to muscle weakness, impaired timing and sequencing, abnormal tone, unbalanced muscle activation, motor apraxia and decreased motor planning, Left gaze preference, decreased attention to right and decreased motor planning, decreased attention, decreased awareness, decreased problem solving, decreased safety  awareness, decreased memory and perseveration and decreased sitting balance, decreased standing balance, hemiplegia and decreased balance strategies.  Prior to hospitalization, patient could complete ADLs with independent .  Patient will benefit from skilled intervention to decrease level of assist with basic self-care skills prior to discharge home with care partner.  Anticipate patient will require 24 hour supervision and moderate physical assestance and follow up home health.  OT - End of Session Activity Tolerance: Tolerates 30+ min activity with multiple rests Endurance Deficit: Yes Endurance Deficit Description: requires multiple rest breaks, decreased respiratory tolerance but HR and O2 sats WNL OT Assessment Rehab Potential (ACUTE ONLY): Good OT Patient demonstrates impairments in the following area(s): Balance;Endurance;Motor;Perception;Safety;Sensory;Cognition OT Basic ADL's Functional Problem(s): Eating;Grooming;Bathing;Dressing;Toileting OT Transfers Functional Problem(s): Toilet;Tub/Shower OT Additional Impairment(s): Fuctional Use of Upper Extremity OT Plan OT Intensity: Minimum of 1-2 x/day, 45 to 90 minutes OT Frequency: 5 out of 7 days OT Duration/Estimated Length of Stay: 21-25 days OT Treatment/Interventions: Balance/vestibular training;Cognitive remediation/compensation;Discharge planning;Disease mangement/prevention;DME/adaptive equipment instruction;Functional mobility training;Neuromuscular re-education;Pain management;Patient/family education;Psychosocial support;Self Care/advanced ADL retraining;Skin care/wound managment;Splinting/orthotics;Therapeutic Activities;Therapeutic Exercise;UE/LE Strength taining/ROM;UE/LE Coordination activities;Visual/perceptual remediation/compensation;Wheelchair propulsion/positioning OT Self Feeding Anticipated Outcome(s): Mod assist OT Basic Self-Care Anticipated Outcome(s): Min assist OT Toileting Anticipated Outcome(s): Mod assist OT  Bathroom Transfers Anticipated Outcome(s): Min assist OT Recommendation Patient destination: Home Follow Up Recommendations: Home health OT;24 hour supervision/assistance Equipment  Recommended: 3 in 1 bedside comode;Tub/shower bench;To be determined   Skilled Therapeutic Intervention OT eval completed with education on rehab process, OT purpose, POC, and goals.  Pt with aphasia therefore difficult to assess prior level of function.  Required Mod assist with bed mobility, mod - max assist for sitting balance, and +2 with self-care tasks seated at EOB due to Rt lean in sitting.  Pt with Lt visual preference but good scanning into all quadrants to visually locate items.  Incorporated reaching to Lt to promote weight shifting to Lt and decrease leaning/ LOB to Rt with sitting.  Squat pivot mod assist +2 with pt demonstrating good initiation of weight shifting.  Grooming completed in sitting with setup for items with pt able to initiate and complete oral care and brushing hair with only setup assist.  Noted perseveration with bathing, but not with grooming tasks.  Applied Kinesiotape to Rt shoulder due to approx 2 finger subluxation to improve support, issued arm trough for support when seated in w/c, and applied positioning handout to pt overhead board to decrease shoulder injury.   OT Evaluation Precautions/Restrictions  Precautions Precautions: Fall Precaution Comments: Peg, Rt shoulder subluxation Restrictions Weight Bearing Restrictions: No Pain Pain Assessment Pain Assessment: Faces Faces Pain Scale: No hurt Home Living/Prior Functioning Home Living Available Help at Discharge: Family, Available 24 hours/day Type of Home: House Home Access: Stairs to enter [employer] of Steps: 3 steps Entrance Stairs-Rails: Right, Left, Can reach both Home Layout: Two level, Able to live on main level with bedroom/bathroom Bathroom Shower/Tub: Tub/shower unit, Regulatory affairs officer Accessibility: Yes Additional Comments: Pt aphasic, therefore all home info obtained from admission coordinator documentation  Lives With: Spouse, Son IADL History Homemaking Responsibilities: Yes 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 Prior Function Level of Independence: Independent with basic ADLs, Independent with homemaking with ambulation, Independent with gait Driving: Yes 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 ADL  See Function Navigator Vision/Perception  Vision- History Baseline Vision/History: Wears glasses Wears Glasses:  ("when needed" according to acute OT note) Vision- Assessment Vision Assessment?: Vision impaired- to be further tested in functional context Eye Alignment: Within Functional Limits Ocular Range of Motion: Within Functional Limits Alignment/Gaze Preference: Gaze left;Other (comment) (however will gaze into Rt field) Tracking/Visual Pursuits: Impaired - to be further tested in functional context Saccades: Impaired - to be further tested in functional context Convergence: Impaired - to be further tested in functional context Visual Fields: Impaired-to be further tested in functional context Additional Comments: Able to locate items in all quadrants, will continue to assess Praxis Praxis-Other Comments: Able to reach for and brush hair with comb, but difficulty with sequencing and initiating bathing steps  Cognition Overall Cognitive Status: Impaired/Different from baseline Arousal/Alertness: Awake/alert Orientation Level: Nonverbal/unable to assess Sensation Sensation Light Touch: Impaired Detail Light Touch Impaired Details: Absent RUE (no response to pain stimulus at nail bed) Stereognosis: Not tested Hot/Cold: Not tested Proprioception: Impaired Detail Proprioception Impaired Details: Impaired RUE Coordination Gross Motor Movements are Fluid and Coordinated: No Fine Motor Movements are Fluid and Coordinated: No Finger Nose  Finger Test: unable to assess due to impaired attention/initiation and RUE weakness 9 Hole Peg Test: unable to assess due to impaired attention/initiation and RUE weakness Motor    Mobility  Bed Mobility Bed Mobility: Rolling Right;Rolling Left;Right Sidelying to Sit Rolling Right: 4: Min assist Rolling Right Details: Tactile cues for initiation;Tactile cues for sequencing;Visual cues/gestures for sequencing;Verbal cues for sequencing;Verbal cues for technique Rolling Left:  3: Mod assist Rolling Left Details: Tactile cues for initiation;Tactile cues for sequencing;Tactile cues for placement;Visual cues/gestures for sequencing;Visual cues/gestures for precautions/safety;Verbal cues for sequencing;Verbal cues for technique;Manual facilitation for weight shifting;Manual facilitation for placement Right Sidelying to Sit: 3: Mod assist Right Sidelying to Sit Details: Tactile cues for initiation;Tactile cues for sequencing;Tactile cues for placement;Visual cues/gestures for precautions/safety;Visual cues/gestures for sequencing;Verbal cues for sequencing;Verbal cues for technique;Manual facilitation for weight shifting;Manual facilitation for placement  Trunk/Postural Assessment     Balance Dynamic Sitting Balance Sitting balance - Comments: Mod-max assist during bathing seated at EOB (approx 20 mins) with lean to Rt.   Extremity/Trunk Assessment RUE Assessment RUE Assessment: Exceptions to [facility] RUE AROM (degrees) RUE Overall AROM Comments: no volitional active ROM, however suspect due to apraxia and imapred awareness and sensation.  Approx 2 finger subluxation RUE PROM (degrees) RUE Overall PROM Comments: WFL RUE Strength RUE Overall Strength Comments: suspect grossly 2/5 in shoulder and elbow as UE not appearing flaccid, however no volitional movement (due to apraxia) therefore unable to truly assess strength. LUE Assessment LUE Assessment: Within Functional Limits   See Function Navigator  for Current Functional Status.   Refer to Care Plan for Long Term Goals  Recommendations for other services: None  Discharge Criteria: Patient will be discharged from OT if patient refuses treatment 3 consecutive times without medical reason, if treatment goals not met, if there is a change in medical status, if patient makes no progress towards goals or if patient is discharged from hospital.  The above assessment, treatment plan, treatment alternatives and goals were discussed and mutually agreed upon: by patient  Simonne Come 05/18/2015, 9:14 AM

## 2015-05-18 NOTE — Progress Notes (Signed)
Social Work  Social Work Assessment and Plan  Patient Details  Name: Kerry [name] MRN: [id number] Date of Birth: Nov 19, 1955  Today's Date: 05/18/2015  Problem List:  Patient Active Problem List   Diagnosis Date Noted  . 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 Hemiparesis, aphasia, and dysphagia as late effect of cerebrovascular accident (CVA) ([place])   . Essential hypertension   . Uncontrolled type 2 diabetes mellitus with ketoacidosis without coma ([place])   . Coronary artery disease involving native coronary artery of native heart with angina pectoris ([place])   . History of breast cancer   . Fibromyalgia   . Chronic pain syndrome   . Generalized anxiety disorder   . Prerenal azotemia   . Pneumonia   . Chronic respiratory failure ([place])   . Respiratory failure ([place])   . Hypokalemia   . Compromised airway   . HLD (hyperlipidemia)   . Type 2 diabetes mellitus with circulatory disorder ([place])   . Sinus bradycardia   . Asystole ([place])   . Acute respiratory failure ([place])   . Cerebral hemorrhage ([place])   . Cytotoxic cerebral edema ([place])   . Sinus pause   . Cerebrovascular accident (CVA) due to occlusion of left middle cerebral artery ([place])   . Cerebrovascular accident (CVA) due to thrombosis of precerebral artery ([place])   . Encounter for central line placement   . Encounter for feeding tube placement   . CVA (cerebral infarction) 04/11/2015  . Stroke (cerebrum) ([place]) 04/11/2015  . Aphasia   . 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 Hypertensive urgency   . Uncontrolled type 2 diabetes mellitus with hyperosmolarity without coma, without long-term current use of insulin ([place])   . Breast cancer ([place]) 11/08/2012   Past Medical History:  Past Medical History  Diagnosis Date  . Hypertension   . Coronary artery disease   . Diabetes mellitus   . Breast cancer ([place]) 03/10/2007    Left breat  . Degenerative disc disease, cervical   .  Degenerative disc disease, lumbar   . Fibromyalgia   . Hyperlipidemia   . Carpal tunnel syndrome on right    Past Surgical History:  Past Surgical History  Procedure Laterality Date  . Cardiac surgery    . Appendectomy    . Breast surgery    . Mastectomy Left 03/2007  . Cardiac catheterization  2005  . Vein surgery Right 2005  . Tubal ligation Right   . Radiology with anesthesia N/A 04/11/2015    Procedure: RADIOLOGY WITH ANESTHESIA;  Surgeon: Luanne Bras, MD;  Location: [facility];  Service: Radiology;  Laterality: N/A;  . Cardiac catheterization N/A 04/13/2015    Procedure: Temporary Pacemaker;  Surgeon: Peter M Martinique, MD;  Location: [place];  Service: Cardiovascular;  Laterality: N/A;  . Peg placement N/A 04/18/2015    Procedure: PERCUTANEOUS ENDOSCOPIC GASTROSTOMY (PEG) PLACEMENT;  Surgeon: Judeth Horn, MD;  Location: [facility];  Service: General;  Laterality: N/A;  bedside  . Esophagogastroduodenoscopy N/A 04/18/2015    Procedure: ESOPHAGOGASTRODUODENOSCOPY (EGD);  Surgeon: Judeth Horn, MD;  Location: [facility];  Service: General;  Laterality: N/A;   Social History:  reports that she has been smoking.  She has never used smokeless tobacco. She reports that she drinks alcohol. She reports that she does not use illicit drugs.  Family / Support Systems Marital Status: Married Patient Roles: Spouse, Parent Spouse/Significant Other: [name] [phone] Children: Matt-son [phone] Other Supports: jeremiah-son whom  lives with them, along with his financee Anticipated Caregiver: Family all together Ability/Limitations of Caregiver: Husband quit his part-tiem job when pt got sick and [place] and fiancee live with them. Very committed to pt Caregiver Availability: 24/7 Family Dynamics: Very close knit family and their two son's.  One son and his fiancee live with them and their other is close by also. All are very dedicated to pt and her care, whatever she  needs they will do for her. They plan to be here and participate in therapies with her while here.  Social History Preferred language: English Religion: Non-Denominational Cultural Background: No issues Education: High School Read: Yes Write: Yes Employment Status: Unemployed Freight forwarder Issues: No issues Guardian/Conservator: None-according to MD pt is not capable of making her own decisions while here so will look toward her husband and son's if he prefers this.   Abuse/Neglect Physical Abuse: Denies Verbal Abuse: Denies Sexual Abuse: Denies Exploitation of patient/patient's resources: Denies Self-Neglect: Denies  Emotional Status Pt's affect, behavior adn adjustment status: Pt is motivated to be here and trying to communicate with staff. She is appropriate  with her gestures and makes eye contact when you are speaking to her. Her family report she was very active and independent and wants to get back to this. She was always taking care of others. Recent Psychosocial Issues: Other health issues but were managed-had breast cancer in 2014 Pyschiatric History: No history deferred depression at this time due to her communication issues, would benefit from neuro-psych seeing sometime throughout her stay.  Will await when more appropriate Substance Abuse History: Tobacco aware of risks and plans to quit. Family did not mention other issues but smoking  Patient / Family Perceptions, Expectations & Goals Pt/Family understanding of illness & functional limitations: Pt and family have a basic understanding of her condition and all she has been through due to they have been through it with her. They do talk with the MD's and feel their quesitons and concerns are being addressed. Pt's son works for [employer]  and explains what they do not understand. Premorbid pt/family roles/activities: Wife, Mother. friend, cancer survivor, home owner, church member, etc Anticipated changes in  roles/activities/participation: resume Pt/family expectations/goals: Pt gestures to do well here. Will re-assess when speaking. Husband states: " I want her to get as well as she can before going home."  Son states: " We will do whatyever she needs [place] to do she would for [place]."  [employer]: None Premorbid Home Care/DME Agencies: None Transportation available at discharge: family Resource referrals recommended: Neuropsychology, Support group (specify)  Discharge Planning Living Arrangements: Spouse/significant other, Children, Other relatives Support Systems: Spouse/significant other, Children, Other relatives, Friends/neighbors, Parent, Church/faith community Type of Residence: Private residence Insurance Resources: Multimedia programmer (specify), Medicaid (specify county) (UHC-compass & medicaid pending) Financial Resources: Family Support Financial Screen Referred: Previously completed Living Expenses: Own Money Management: Spouse, Patient Does the patient have any problems obtaining your medications?: No Home Management: Patient did home management now family will do  Patient/Family Preliminary Plans: Return home with husband, son and son's financee who can provide 24 hr physical assist, along with other son assisting when he can. All plan to be here and observe and participate in therapies with pt when appropriate. They are here today observing.  Seems to provide emotional support to ot having them here. Social Work Anticipated Follow Up Needs: HH/OP, Support Group  Clinical Impression Pleasant motivated female who has been through it, but is ready  to rehab and get back home. Very supportive and committed family who will provide whatever assistance she needs at home. Will check with other son regarding if disability and Medicaid applications started. Will work on discharge plans once team has completed their evaluations. Will have neuro-psych see pt when  appropriate.  Elease Hashimoto 05/18/2015, 12:48 PM

## 2015-05-18 NOTE — Evaluation (Signed)
Physical Therapy Assessment and Plan  Patient Details  Name: Kerry [name] MRN: [id number] Date of Birth: 11-07-1955  PT Diagnosis: Abnormal posture, Abnormality of gait, Cognitive deficits, Difficulty walking, Hemiparesis dominant, Hypotonia, Impaired sensation and Muscle weakness Rehab Potential: Fair ELOS: 21-24 days   Today's Date: 05/18/2015 PT Individual Time: 1030-1140 PT Individual Time Calculation (min): 70 min    Problem List:  Patient Active Problem List   Diagnosis Date Noted  . 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 Hemiparesis, aphasia, and dysphagia as late effect of cerebrovascular accident (CVA) ([place])   . Essential hypertension   . Uncontrolled type 2 diabetes mellitus with ketoacidosis without coma ([place])   . Coronary artery disease involving native coronary artery of native heart with angina pectoris ([place])   . History of breast cancer   . Fibromyalgia   . Chronic pain syndrome   . Generalized anxiety disorder   . Prerenal azotemia   . Pneumonia   . Chronic respiratory failure ([place])   . Respiratory failure ([place])   . Hypokalemia   . Compromised airway   . HLD (hyperlipidemia)   . Type 2 diabetes mellitus with circulatory disorder ([place])   . Sinus bradycardia   . Asystole ([place])   . Acute respiratory failure ([place])   . Cerebral hemorrhage ([place])   . Cytotoxic cerebral edema ([place])   . Sinus pause   . Cerebrovascular accident (CVA) due to occlusion of left middle cerebral artery ([place])   . Cerebrovascular accident (CVA) due to thrombosis of precerebral artery ([place])   . Encounter for central line placement   . Encounter for feeding tube placement   . CVA (cerebral infarction) 04/11/2015  . Stroke (cerebrum) ([place]) 04/11/2015  . Aphasia   . 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 Hypertensive urgency   . Uncontrolled type 2 diabetes mellitus with hyperosmolarity without coma, without long-term current use of insulin  ([place])   . Breast cancer ([place]) 11/08/2012    Past Medical History:  Past Medical History  Diagnosis Date  . Hypertension   . Coronary artery disease   . Diabetes mellitus   . Breast cancer ([place]) 03/10/2007    Left breat  . Degenerative disc disease, cervical   . Degenerative disc disease, lumbar   . Fibromyalgia   . Hyperlipidemia   . Carpal tunnel syndrome on right    Past Surgical History:  Past Surgical History  Procedure Laterality Date  . Cardiac surgery    . Appendectomy    . Breast surgery    . Mastectomy Left 03/2007  . Cardiac catheterization  2005  . Vein surgery Right 2005  . Tubal ligation Right   . Radiology with anesthesia N/A 04/11/2015    Procedure: RADIOLOGY WITH ANESTHESIA;  Surgeon: Luanne Bras, MD;  Location: [facility];  Service: Radiology;  Laterality: N/A;  . Cardiac catheterization N/A 04/13/2015    Procedure: Temporary Pacemaker;  Surgeon: Peter M Martinique, MD;  Location: [facility];  Service: Cardiovascular;  Laterality: N/A;  . Peg placement N/A 04/18/2015    Procedure: PERCUTANEOUS ENDOSCOPIC GASTROSTOMY (PEG) PLACEMENT;  Surgeon: Judeth Horn, MD;  Location: [facility];  Service: General;  Laterality: N/A;  bedside  . Esophagogastroduodenoscopy N/A 04/18/2015    Procedure: ESOPHAGOGASTRODUODENOSCOPY (EGD);  Surgeon: Judeth Horn, MD;  Location: [facility] ENDOSCOPY;  Service: General;  Laterality: N/A;    Assessment & Plan Clinical Impression: 59 yo female with a history of HTN, CAD, DM  type 2, breast cancer who was admitted on 04/11/15 to [facility] with right facial and right sided weakness. CT showed L MCA sign and TPA given but she had worsening of right hemiparesis with lethargy. Repeat CT negative for bleed and she underwent cerebral angio showed atherosclerotic occlusion of L ICA and was treated with embolectomy and stent placement and TICI revascularization. Post procedure was vent dependent and developed small patchy areas of hemorrhage  with acute large L MCA infarct as well as cerebral edema. She was treated with hypertonic saline for edema management. Malignant HTN treated with cardene drip and cardiology consulted for assistance with severe bradycardia with periods of asystole. Temporary pacemaker placed 10/21 and removed 10/24 once HR stabilized. PEG and Trach placed by Dr. Hulen Skains at bedside 04/18/15. Pt switched from ASA to plavix for carotid stenting after procedure. MSSA RLL HCAP with mucomyst added and albuterol per pulmonary consultation. Treated also with lasix, Ceftaz and Vanc. Respiratory status remained tenuous with secretions and pt transferred to [facility] on 04/27/15.  Upon admit to [facility] pt was on FIO2 of 28 % and treated for PNA with IV antibiotics, now PNA resolved. Pt placed on Plavix and added on fish oil. Patient decannualted on 05/14/15. Pt now on room air with sats 95%. Pt continues with PEG tube feeds. SLP evaluated pt on 11/21 and pt placed on dysphagia 1 diet with honey thick liquids. Pt maintained on sliding scale insulin and glipizide. Hgb A1c 11.4. Continued on statin for hyperlipidemia. For ETOH pt placed on thiamine, folic acid and vitamins. For tobacco abuse pt placed on nicotine patch.  Patient transferred to CIR on 05/16/2015 .   Patient currently requires mod/maxA +2 with mobility secondary to muscle weakness, decreased cardiorespiratoy endurance, abnormal tone, motor apraxia and decreased motor planning, decreased initiation, decreased attention, decreased awareness, decreased problem solving and decreased safety awareness and decreased sitting balance, decreased standing balance, decreased postural control, hemiplegia and decreased balance strategies.  Prior to hospitalization, patient was independent  with mobility and lived with Spouse, Son in a House home.  Home access is 3 stepsStairs to enter.  Patient will benefit from skilled PT intervention to maximize safe functional mobility, minimize fall risk and  decrease caregiver burden for planned discharge home with 24 hour assist.  Anticipate patient will benefit from follow up [facility] at discharge.  PT - End of Session Activity Tolerance: Tolerates 30+ min activity with multiple rests Endurance Deficit: Yes Endurance Deficit Description: several rest breaks throughout, heavy breathing following all mobility however O2 sats WNL PT Assessment Rehab Potential (ACUTE/IP ONLY): Fair Barriers to Discharge: [place] home environment (3 STE home) PT Patient demonstrates impairments in the following area(s): Balance;Perception;Safety;Endurance;Sensory;Motor;Skin Integrity PT Transfers Functional Problem(s): Bed Mobility;Bed to Chair;Car;Furniture PT Locomotion Functional Problem(s): Ambulation;Wheelchair Mobility PT Plan PT Intensity: Minimum of 1-2 x/day ,45 to 90 minutes PT Frequency: 5 out of 7 days PT Duration Estimated Length of Stay: 21-24 days PT Treatment/Interventions: Ambulation/gait training;Balance/vestibular training;Community reintegration;Cognitive remediation/compensation;Discharge planning;Disease management/prevention;Functional electrical stimulation;DME/adaptive equipment instruction;Functional mobility training;Neuromuscular re-education;Patient/family education;Psychosocial support;Stair training;Splinting/orthotics;Therapeutic Exercise;Therapeutic Activities;UE/LE Strength taining/ROM;UE/LE Coordination activities;Visual/perceptual remediation/compensation;Wheelchair propulsion/positioning PT Transfers Anticipated Outcome(s): minA squat pivot PT Locomotion Anticipated Outcome(s): S w/c management, modA gait with therapy only PT Recommendation Follow Up Recommendations: Home health PT Patient destination: Home Equipment Recommended: To be determined  Skilled Therapeutic Intervention Pt received seated in w/c with family present (husband, son, son's fiance). Pt non-verbal, does not initiate yes/no head shaking, and inconsistent  thumbs up/down according to family. History gathered via  family members. Performed bed mobility, transfers, sit <>stand with assist as described below. Pt has no AROM in R extremities, however unable to determine if flaccid or due to apraxia as family reports occasional spontaneous movement when in [facility]. Pt transferred to tilt-in-space w/c to A with positioning, pressure relief, head support. Extensive family education as they were not present for earlier OT session; discussed RUE positioning and safety, therapy schedule, goals, estimated length of stay, positioning in chair. Pt left seated in w/c semi-reclined with family present; instructed family in use of quick release belt when leaving pt alone and family agreeable.   PT Evaluation Precautions/Restrictions Precautions Precautions: Fall Precaution Comments: Peg, Rt shoulder subluxation Restrictions Weight Bearing Restrictions: No General Chart Reviewed: Yes PT Amount of Missed Time (min): 20 Minutes PT Missed Treatment Reason: Patient fatigue Family/Caregiver Present: Yes (husband, son and son's fiance)  Vital Signs  BP 131/87 HR 119 O2 99% Pain Pain Assessment Pain Assessment: Faces Faces Pain Scale: No hurt Home Living/Prior Functioning Home Living Available Help at Discharge: Family;Available 24 hours/day Type of Home: House Home Access: Stairs to enter [employer] of Steps: 3 steps Entrance Stairs-Rails: Right;Left;Can reach both Home Layout: Two level;Able to live on main level with bedroom/bathroom Bathroom Shower/Tub: Tub/shower unit;Curtain Bathroom Toilet: Standard Bathroom Accessibility: Yes Additional Comments: Info from family, in agreement with admission documentation  Lives With: Spouse;Son Prior Function Level of Independence: Independent with basic ADLs;Independent with homemaking with ambulation;Independent with gait;Independent with transfers  Able to Take Stairs?: Yes Driving: Yes 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 Leisure: Hobbies-yes (Comment) Comments: enjoyed cooking, visiting flea markets, antique shops Vision/Perception  Vision - Assessment Eye Alignment: Within Functional Limits Ocular Range of Motion: Within Functional Limits Alignment/Gaze Preference: Gaze left;Other (comment) Tracking/Visual Pursuits: Impaired - to be further tested in functional context Saccades: Impaired - to be further tested in functional context Convergence: Impaired - to be further tested in functional context Additional Comments: Able to locate items in all quadrants, will continue to assess Praxis Praxis-Other Comments: follows basic one step commands for L extremities, perseverative with LLE,   Cognition Overall Cognitive Status: Impaired/Different from baseline Arousal/Alertness: Awake/alert Orientation Level:  (pt non-verbal, unable to assess) Sensation Sensation Light Touch: Impaired Detail Light Touch Impaired Details: Impaired RLE Stereognosis: Not tested Hot/Cold: Not tested Proprioception: Impaired Detail Proprioception Impaired Details: Impaired LLE Coordination Gross Motor Movements are Fluid and Coordinated: No Fine Motor Movements are Fluid and Coordinated: No Finger Nose Finger Test: unable to assess due to impaired attention/initiation and RUE weakness Heel Shin Test: unable to assess due to impaired attention, intitation, RLE weakness, apraxia 9 Hole Peg Test: unable to assess due to impaired attention/initiation and RUE weakness Motor  Motor Motor: Hemiplegia;Abnormal postural alignment and control;Motor impersistence;Motor perseverations;Motor apraxia Motor - Skilled Clinical Observations: R lateral trunk lean, R extremities flaccid  Mobility Bed Mobility Bed Mobility: Rolling Right;Rolling Left;Supine to Sit;Sit to Supine Rolling Right: 4: Min assist Rolling Right Details: Tactile cues for initiation;Tactile cues for sequencing;Visual cues/gestures for  sequencing;Verbal cues for sequencing;Verbal cues for technique Rolling Left: 3: Mod assist Rolling Left Details: Tactile cues for initiation;Tactile cues for sequencing;Tactile cues for placement;Visual cues/gestures for sequencing;Visual cues/gestures for precautions/safety;Verbal cues for sequencing;Verbal cues for technique;Manual facilitation for weight shifting;Manual facilitation for placement Right Sidelying to Sit: 3: Mod assist Right Sidelying to Sit Details: Tactile cues for initiation;Tactile cues for sequencing;Tactile cues for placement;Visual cues/gestures for precautions/safety;Visual cues/gestures for sequencing;Verbal cues for sequencing;Verbal cues for technique;Manual facilitation for weight shifting;Manual  facilitation for placement Supine to Sit: 3: Mod assist Supine to Sit Details: Tactile cues for placement;Tactile cues for weight shifting;Verbal cues for sequencing;Verbal cues for technique;Manual facilitation for placement;Manual facilitation for weight shifting Sit to Supine: 2: Max assist Sit to Supine - Details: Verbal cues for sequencing;Verbal cues for technique;Manual facilitation for weight shifting;Manual facilitation for placement;Tactile cues for posture;Tactile cues for placement Transfers Transfers: Yes Squat Pivot Transfers: 1: +2 Total assist Squat Pivot Transfer Details: Manual facilitation for weight shifting;Manual facilitation for placement;Verbal cues for sequencing;Tactile cues for posture;Tactile cues for sequencing;Tactile cues for weight shifting Locomotion  Ambulation Ambulation: No (NT due to safety concerns) Stairs / Additional Locomotion Stairs: No (NT due to safety concerns) Wheelchair Mobility Wheelchair Mobility: No (NT, pt in tilt in space w/c, apraxia, RLE flaccidity)  Trunk/Postural Assessment  Cervical Assessment Cervical Assessment: Exceptions to [facility] (forward head posture, requires cues for cervical extension to bring head to neutral,  unable to maintain >20-30 sec) Thoracic Assessment Thoracic Assessment: Exceptions to [facility] (fixed thoracic kyphosis unable to correct, R lateral lean ) Lumbar Assessment Lumbar Assessment: Exceptions to [facility] (posterior pelvic tilt) Postural Control Postural Control: Deficits on evaluation (decreased righting reactions in sitting)  Balance Balance Balance Assessed: Yes Static Sitting Balance Static Sitting - Balance Support: Feet supported;No upper extremity supported Static Sitting - Level of Assistance: 2: Max assist Static Sitting - Comment/# of Minutes: maxA with no UE support, minA with LUE on mat table Dynamic Sitting Balance Dynamic Sitting - Balance Support: Feet supported;No upper extremity supported Dynamic Sitting - Level of Assistance: 2: Max assist Sitting balance - Comments: Mod-max assist during bathing seated at EOB (approx 20 mins) with lean to Rt.   Static Standing Balance Static Standing - Balance Support: No upper extremity supported Static Standing - Level of Assistance: 1: +2 Total assist Static Standing - Comment/# of Minutes: x20 sec Extremity Assessment  RUE Assessment RUE Assessment: Exceptions to [facility] RUE AROM (degrees) RUE Overall AROM Comments: no noted AROM, per family report pt does demonstrate occasional volitional movement however unsure if due to tone, apraxia  RUE PROM (degrees) RUE Overall PROM Comments: WFL RUE Strength RUE Overall Strength Comments: defer to OT exam LUE Assessment LUE Assessment: Within Functional Limits RLE Assessment RLE Assessment: Exceptions to [facility] (as with UE, no AROM however suspect due to apraxia due to family report of occasional spontaneous movement) LLE Assessment LLE Assessment: Within Functional Limits   See Function Navigator for Current Functional Status.   Refer to Care Plan for Long Term Goals  Recommendations for other services: None  Discharge Criteria: Patient will be discharged from PT if patient  refuses treatment 3 consecutive times without medical reason, if treatment goals not met, if there is a change in medical status, if patient makes no progress towards goals or if patient is discharged from hospital.  The above assessment, treatment plan, treatment alternatives and goals were discussed and mutually agreed upon: by family  Luberta Mutter 05/18/2015, 12:14 PM

## 2015-05-18 NOTE — Progress Notes (Signed)
[place] PHYSICAL MEDICINE & REHABILITATION     PROGRESS NOTE    Subjective/Complaints: Up sitting in chair with therapy. Actually did quite nicely with transfer. Appears comfortable. No problems reported by RN overnight Unable to perform ROS due to aphasia  Objective: Vital Signs: Blood pressure 142/68, pulse 98, temperature 97.8 F (36.6 C), temperature source Oral, resp. rate 20, weight 80.196 kg (176 lb 12.8 oz), SpO2 98 %. No results found.  Recent Labs  05/17/15 0452  WBC 9.4  HGB 11.6*  HCT 37.0  PLT 224    Recent Labs  05/17/15 0452  NA 137  K 4.9  CL 101  GLUCOSE 152*  BUN 22*  CREATININE 0.58  CALCIUM 9.6   CBG (last 3)   Recent Labs  05/17/15 1630 05/17/15 2043 05/18/15 0643  GLUCAP 127* 166* 167*    Wt Readings from Last 3 Encounters:  05/16/15 80.196 kg (176 lb 12.8 oz)  05/16/15 86.183 kg (190 lb)  04/27/15 92.5 kg (203 lb 14.8 oz)    Physical Exam:  Constitutional: She appears well-developed and well-nourished. No distress.  Sitting up in bed and comfortable  HENT:  Head: Normocephalic and atraumatic.  Mouth/Throat: Oropharynx is   Moist still with film   Missing upper teeth and multiple lower teeth.  Eyes: Conjunctivae and EOM are normal. Pupils are equal, round, and reactive to light.  Neck: Normal range of motion. Neck supple.  Cardiovascular: Normal rate and regular rhythm. Exam reveals no friction rub.  Respiratory: Effort normal. No respiratory distress. She has wheezes. Stridor, minimal air leakage today GI: Soft. Bowel sounds are normal. She exhibits no distension. There is no tenderness.  PEG site clean and dry without drainage.  Musculoskeletal: She exhibits no edema or tenderness.  Neurological: She is alert.  Did not make attempts at phonation.  Perseverative behaviors noted but able to hold comb and use to brush hair.   Apraxia noted. occasionally with an appropriate y/n nod. Poor oral-motor  control Inconsistently follows commands with perseveration Dense right hemiparesis  Lacks insight and awareness of deficits.  Unable to perform motor and sensory testing, however appears to >/ 3/5 in LUE and LLE. DTRs 3+ on right side    Skin: Skin is warm and dry. No rash noted. She is not diaphoretic. No erythema.  Psychiatric: She is not slowed and not withdrawn. Cognition and memory are impaired. She is noncommunicative. She is inattentive.     Assessment/Plan: 1. 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 Physiatrist is providing close team supervision and 24 hour management of active medical problems listed below. Physiatrist and rehab team continue to assess barriers to discharge/monitor patient progress toward functional and medical goals.  Function:  Bathing Bathing position   Position: Sitting EOB (bed level for perineal hygiene, sitting EOB for rest of bathing)  Bathing parts Body parts bathed by patient: Chest, Abdomen, Right upper leg, Left upper leg Body parts bathed by helper: Front perineal area, Buttocks, Back  Bathing assist Assist Level: 2 helpers (+2 for sitting balance at EOB)      Upper Body Dressing/Undressing Upper body dressing   What is the patient wearing?: Hospital gown                Upper body assist Assist Level: 2 helpers (+2 for sitting balance at EOB)      Lower Body Dressing/Undressing Lower body dressing  What is the patient wearing?: Non-skid slipper socks, Hospital Gown           Non-skid slipper socks- Performed by helper: Don/doff right sock, Don/doff left sock                  Lower body assist Assist for lower body dressing: 2 Helpers (+2 for sitting balance while 2nd person donned socks)      Toileting Toileting          Toileting assist     Transfers Chair/bed transfer   Chair/bed  transfer method: Squat pivot Chair/bed transfer assist level: 2 helpers Chair/bed transfer assistive device: Education officer, community Comprehension Comprehension assist level: Understands basic 25 - 49% of the time/ requires cueing 50 - 75% of the time  Expression Expression assist level: Expresses basic 25 - 49% of the time/requires cueing 50 - 75% of the time. Uses single words/gestures.  Social Interaction Social Interaction assist level: Interacts appropriately less than 25% of the time. May be withdrawn or combative.  Problem Solving Problem solving assist level: Solves basic 25 - 49% of the time - needs direction more than half the time to initiate, plan or complete simple activities  Memory Memory assist level:  (nonverbal- unable to assess)   Medical Problem List and Plan: 1. Functional deficits secondary to Left MCA infarct  -continue CIR therapies 2. DVT Prophylaxis/Anticoagulation: Pharmaceutical: Lovenox 3. Chronic DDD/Fibromyalgia/Pain Management: Used oxycodone prn at home per family. Continue tylenol prn for now  -appears comfortable 4. Mood: LCSW to follow for evaluation and support as mentation improves.  5. Neuropsych: This patient is not capable of making decisions on her own behalf. 6. Skin/Wound Care: Routine pressure relief measures 7. Fluids/Electrolytes/Nutrition: Monitor I/O and hold continuous tube feeds to allow for appetite to improve.  checking calorie count to monitor intake. Continue prosource supplements. Water flushes qid per PEG. 8. HTN: Monitor BP bid. On Lisinopril, hydralazine and catapres. 9. DM type 2: Family reports non-compliance with insulin at home. Will monitor BS ac/hs. Continue lantus bid with glipizide BID. Use SSI for elevated BS and titrate lantus as indicated.   -good control at present.cbg's reviewed 10. Anxiety disorder: Use Paxil and ativan at home. Monitor as awareness  improves.  11. Acute pre-renal azotemia: increase water flushes to qid. --I personally reviewed the patient's labs today. Bun now 22 12. Hx VDRF: continue occlusive dressing to trach site--no leakage today---appears to be comfortable and managing airway well at present  -high aspiration risk  -full supervision with meals, D1 honey   LOS (Days) 2 A FACE TO FACE EVALUATION WAS PERFORMED  SWARTZ,ZACHARY T 05/18/2015 9:33 AM

## 2015-05-18 NOTE — Progress Notes (Addendum)
Initial Nutrition Assessment   INTERVENTION:  Magic cup TID, each supplement provides 290 kcal and 9 grams of protein  Follow up on calorie count results. (no results documented yet)  Per meal completion documentation pt has been eating 100%  NUTRITION DIAGNOSIS:   Increased nutrient needs related to  (increased energy expenditure with participation of therapies ) as evidenced by estimated needs.  GOAL:   Patient will meet greater than or equal to 90% of their needs  MONITOR:   PO intake, Supplement acceptance, Diet advancement, Weight trends, Labs, I & O's  REASON FOR ASSESSMENT:   Consult Calorie Count  ASSESSMENT:   Pt admitted after MCA stroke. PEG placed during hospitalization.   TF being held to increase appetite.  Meal Completion: 95-100% Calorie count ordered starting 11/25, will follow up Monday 11/28 with full results.  Prostat BID 100 ml free water TID with meals and bedtime.  CBG's: 127-167  Pt had started eating lunch with nursing tech when I entered pt's room. She tends to eat too fast and take too big of a bite when eating and requires supervision. Per family pt is not a picky eater, loves pudding and is eating everything on her trays.  Discussed magic cups, will offer.   Unable to complete Nutrition-Focused physical exam at this time.   Diet Order:  DIET - DYS 1 Room service appropriate?: Yes; Fluid consistency:: Pudding Thick  Skin:  Wound (see comment) (MASD on buttocks and thigh)  Last BM:  11/24  Height:   Ht Readings from Last 1 Encounters:  05/16/15 5\' 10"  (1.778 m)   Weight:   Wt Readings from Last 1 Encounters:  05/16/15 176 lb 12.8 oz (80.196 kg)  Usual weight unknown  Ideal Body Weight:  68.1 kg  BMI:  Body mass index is 25.37 kg/(m^2).  Estimated Nutritional Needs:   Kcal:  1500-1700  Protein:  85-95 grams  Fluid:  > 1.5 L/day  EDUCATION NEEDS:   No education needs identified at this time  [place], [place],  [place] Pager [phone] After Hours Pager

## 2015-05-18 NOTE — IPOC Note (Signed)
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 ([place]) Active Problems:   Hemiparesis, aphasia, and dysphagia as late effect of cerebrovascular accident (CVA) ([place])   Essential hypertension   Uncontrolled type 2 diabetes mellitus with ketoacidosis without coma ([place])   Coronary artery disease involving native coronary artery of native heart with angina pectoris ([place])   History of breast cancer   Fibromyalgia   Chronic pain syndrome   Generalized anxiety disorder   Prerenal azotemia     Functional Problem List: Nursing Bladder, Bowel, Edema, Endurance, Motor, Nutrition, Perception, Sensory, Safety, Skin Integrity  PT Balance, Perception, Safety, Endurance, Sensory, Motor, Skin Integrity  OT Balance, Endurance, Motor, Perception, Safety, Sensory, Cognition  SLP Cognition, Linguistic, Motor, Nutrition, Safety  TR         Basic ADL's: OT Eating, Grooming, Bathing, Dressing, Toileting     Advanced  ADL's: OT       Transfers: PT Bed Mobility, Bed to Chair, Car, Manufacturing systems engineer, Metallurgist: PT Ambulation, Emergency planning/management officer     Additional Impairments: OT Fuctional Use of Upper Extremity  SLP Swallowing, Communication, Social Cognition comprehension, expression Problem Solving, Memory, Attention, Awareness  TR      Anticipated Outcomes Item Anticipated Outcome  Self Feeding Mod assist  Swallowing  Mod assist    Basic self-care  Min assist  Toileting  Mod assist   Bathroom Transfers Min assist  Bowel/Bladder  mod I  Transfers  minA squat pivot  Locomotion  S w/c management, modA gait with therapy only  Communication  Max assist   Cognition  Mod assist   Pain  <2  Safety/Judgment  mod    Therapy Plan: PT Intensity: Minimum of 1-2 x/day ,45 to 90  minutes PT Frequency: 5 out of 7 days PT Duration Estimated Length of Stay: 21-24 days OT Intensity: Minimum of 1-2 x/day, 45 to 90 minutes OT Frequency: 5 out of 7 days OT Duration/Estimated Length of Stay: 21-25 days SLP Intensity: Minumum of 1-2 x/day, 30 to 90 minutes SLP Frequency: 3 to 5 out of 7 days SLP Duration/Estimated Length of Stay: 19-22 days        Team Interventions: Nursing Interventions Patient/Family Education, Bladder Management, Bowel Management, Cognitive Remediation/Compensation, Pain Management, Medication Management, Dysphagia/Aspiration Precaution Training, Discharge Planning, Skin Care/Wound Management  PT interventions Ambulation/gait training, Training and development officer, Community reintegration, Cognitive remediation/compensation, Discharge planning, Disease management/prevention, Functional electrical stimulation, DME/adaptive equipment instruction, Functional mobility training, Neuromuscular re-education, Patient/family education, Psychosocial support, Stair training, Splinting/orthotics, Therapeutic Exercise, Therapeutic Activities, UE/LE Strength taining/ROM, UE/LE Coordination activities, Visual/perceptual remediation/compensation, Wheelchair propulsion/positioning  OT Interventions Training and development officer, Cognitive remediation/compensation, Discharge planning, Disease mangement/prevention, DME/adaptive equipment instruction, Functional mobility training, Neuromuscular re-education, Pain management, Patient/family education, Psychosocial support, Self Care/advanced ADL retraining, Skin care/wound managment, Splinting/orthotics, Therapeutic Activities, Therapeutic Exercise, UE/LE Strength taining/ROM, UE/LE Coordination activities, Visual/perceptual remediation/compensation, Wheelchair propulsion/positioning  SLP Interventions Cognitive remediation/compensation, English as a second language teacher, Dysphagia/aspiration precaution training, Environmental controls, Functional tasks,  Internal/external aids, Patient/family education, Multimodal communication approach, Speech/Language facilitation, Therapeutic Activities  TR Interventions    SW/CM Interventions Discharge Planning, Psychosocial Support, Patient/Family Education    Team Discharge Planning: Destination: PT-Home ,OT- Home , SLP-Home Projected Follow-up: PT-Home health PT, OT-  Home health OT, 24 hour supervision/assistance, SLP-24 hour supervision/assistance, Home Health SLP, Outpatient SLP Projected Equipment Needs: PT-To be determined,  OT- 3 in 1 bedside comode, Tub/shower bench, To be determined, SLP-None recommended by SLP Equipment Details: PT- , OT-  Patient/family involved in discharge planning: PT- Patient, Family member/caregiver,  OT-Patient unable/family or caregiver not available, SLP-Patient unable/family or caregive not available  MD ELOS: 21-23 days Medical Rehab Prognosis:  Good Assessment: The patient has been admitted for CIR therapies with the diagnosis of CVA. The team will be addressing functional mobility, strength, stamina, balance, safety, adaptive techniques and equipment, self-care, bowel and bladder mgt, patient and caregiver education, visual-spatial awareness, language, swallowing, communication, NMR. Goals have been set at min to mod assist with basic mobility, self-care/ADL's, and mod to max assist with language and communication.    Meredith Staggers, MD, FAAPMR      See Team Conference Notes for weekly updates to the plan of care

## 2015-05-18 NOTE — Evaluation (Signed)
Speech Language Pathology Assessment and Plan  Patient Details  Name: Kerry [name] MRN: [id number] Date of Birth: May 22, 1956  SLP Diagnosis: Aphasia;Apraxia;Cognitive Impairments;Dysphagia  Rehab Potential: Fair ELOS: 19-22 days    Today's Date: 05/18/2015 SLP Individual Time: 0865-7846 SLP Individual Time Calculation (min): 57 min   Problem List:  Patient Active Problem List   Diagnosis Date Noted  . 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 Hemiparesis, aphasia, and dysphagia as late effect of cerebrovascular accident (CVA) ([place])   . Essential hypertension   . Uncontrolled type 2 diabetes mellitus with ketoacidosis without coma ([place])   . Coronary artery disease involving native coronary artery of native heart with angina pectoris ([place])   . History of breast cancer   . Fibromyalgia   . Chronic pain syndrome   . Generalized anxiety disorder   . Prerenal azotemia   . Pneumonia   . Chronic respiratory failure ([place])   . Respiratory failure ([place])   . Hypokalemia   . Compromised airway   . HLD (hyperlipidemia)   . Type 2 diabetes mellitus with circulatory disorder ([place])   . Sinus bradycardia   . Asystole ([place])   . Acute respiratory failure ([place])   . Cerebral hemorrhage ([place])   . Cytotoxic cerebral edema ([place])   . Sinus pause   . Cerebrovascular accident (CVA) due to occlusion of left middle cerebral artery ([place])   . Cerebrovascular accident (CVA) due to thrombosis of precerebral artery ([place])   . Encounter for central line placement   . Encounter for feeding tube placement   . CVA (cerebral infarction) 04/11/2015  . Stroke (cerebrum) ([place]) 04/11/2015  . Aphasia   . 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 Hypertensive urgency   . Uncontrolled type 2 diabetes mellitus with hyperosmolarity without coma, without long-term current use of insulin ([place])   . Breast cancer ([place]) 11/08/2012   Past Medical History:  Past Medical  History  Diagnosis Date  . Hypertension   . Coronary artery disease   . Diabetes mellitus   . Breast cancer ([place]) 03/10/2007    Left breat  . Degenerative disc disease, cervical   . Degenerative disc disease, lumbar   . Fibromyalgia   . Hyperlipidemia   . Carpal tunnel syndrome on right    Past Surgical History:  Past Surgical History  Procedure Laterality Date  . Cardiac surgery    . Appendectomy    . Breast surgery    . Mastectomy Left 03/2007  . Cardiac catheterization  2005  . Vein surgery Right 2005  . Tubal ligation Right   . Radiology with anesthesia N/A 04/11/2015    Procedure: RADIOLOGY WITH ANESTHESIA;  Surgeon: Luanne Bras, MD;  Location: [place];  Service: Radiology;  Laterality: N/A;  . Cardiac catheterization N/A 04/13/2015    Procedure: Temporary Pacemaker;  Surgeon: Peter M Martinique, MD;  Location: [place] CV [facility];  Service: Cardiovascular;  Laterality: N/A;  . Peg placement N/A 04/18/2015    Procedure: PERCUTANEOUS ENDOSCOPIC GASTROSTOMY (PEG) PLACEMENT;  Surgeon: Judeth Horn, MD;  Location: [place];  Service: General;  Laterality: N/A;  bedside  . Esophagogastroduodenoscopy N/A 04/18/2015    Procedure: ESOPHAGOGASTRODUODENOSCOPY (EGD);  Surgeon: Judeth Horn, MD;  Location: [facility] ENDOSCOPY;  Service: General;  Laterality: N/A;    Assessment / Plan / Recommendation Clinical Impression VALINCIA [name] is a 59 y.o. female with history of breast cancer, HTN, CAD, chronic pain, DM type 2, medication non-compliance  who was admitted on 04/11/15 with difficulty speaking, right facial and right sided weakness. CT showed dense L-MCA sign and TPA started but she had worsening of right hemiparesis with lethargy. Repeat CT negative for bleed and she underwent cerebral angio showed atherosclerotic occlusion of L-ICA and was treated with embolectomy and stent placement and TICI revascularization. Post procedure developed acute respiratory failure with dense right  hemiparesis with global aphasia. She was vent dependent and developed small patchy areas of hemorrhage within acute Large L-MCA infarct as well as cerebral edema. She was started on hypertonic saline for edema management. Malignant HTN treated with cardene drip and cardiology consulted for assistance with severe bradycardia with periods of asystole.  Temporary pacemaker placed by Dr. Martinique on 10/21. She continued to have lethargy with decrease in LOC requiring PEG placement by Dr. Hulen Skains and Lurline Idol with BAL performed by Dr Nelda Marseille. She developed MSSA PNA that was treated with IV antibiotics and required IV diuresis for CHF.  She was weaned to trach collar but continued to have significant secretions requiring frequent suctioning.  As a result, she was discharged to [facility] on 11/02 for medical management and rehab.  Respiratory status has been stable and she was decannulated on 11/21.  MBS done 11/14 and feeding trials initiated. She was started on dysphagia 1, honey-thick liquids on 11/21 and tube feeds ongoing as supplement.  She has been showing improvement in activity tolerance and has been tolerating therapy. Patient with resultant right hemiparesis with sensory deficits, expressive > receptive aphasia with apraxia and dysphagia.  She was felt to be a good candidate for intensive rehab program and CIR was recommended for follow up therapy.  Patient was admitted to [place] 05/16/15 and evaluated 05/18/15.  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 Patient also demonstrates perseveration and poor self-monitoring.  Swallow is marked by a suspected delay, multiple swallows and delayed throat clears/weak coughs.  As a result, recommend diet downgrade to Dys.1 textures and pudding-thick liquids with continued full staff  supervision for strict use of safe swallow strategies.  Patient would benefit from skilled SLP intervention in order to maximize their functional independence prior to discharge. Anticipate patient will require 24 hour supervision at home and follow up SLP services.    Skilled Therapeutic Interventions          Cognitive-linguistic and bedside swallow evaluations completed.  Plan to share results and recommendations with family at next visit.      SLP Assessment  Patient will need skilled Speech Lanaguage Pathology Services during CIR admission    Recommendations  SLP Diet Recommendations: Dysphagia 1 (Puree);Honey Liquid Administration via: Spoon Medication Administration: Via alternative means Supervision: Patient able to self feed;Full supervision/cueing for compensatory strategies;Staff to assist with self feeding Compensations: Minimize environmental distractions;Slow rate;Small sips/bites;Multiple dry swallows after each bite/sip Postural Changes and/or Swallow Maneuvers: Out of bed for meals Oral Care Recommendations: Oral care BID Patient destination: Home Follow up Recommendations: 24 hour supervision/assistance;Home Health SLP;Outpatient SLP Equipment Recommended: None recommended by SLP    SLP Frequency 3 to 5 out of 7 days   SLP Treatment/Interventions Cognitive remediation/compensation;Cueing hierarchy;Dysphagia/aspiration precaution training;Environmental controls;Functional tasks;Internal/external aids;Patient/family education;Multimodal communication approach;Speech/Language facilitation;Therapeutic Activities   Pain Pain Assessment Pain Assessment: No/denies pain Prior Functioning Cognitive/Linguistic Baseline: Information not available  Function:  Eating Eating   Modified Consistency Diet: Yes Eating Assist Level: Helper checks for  pocketed food;Supervision or verbal cues;Help with picking up utensils;Set up assist for   Eating Set Up Assist For: Opening  containers       Cognition Comprehension Comprehension assist level: Understands basic less than 25% of the time/ requires cueing >75% of the time  Expression   Expression assist level: Expresses basis less than 25% of the time/requires cueing >75% of the time.  Social Interaction Social Interaction assist level: Interacts appropriately less than 25% of the time. May be withdrawn or combative.  Problem Solving Problem solving assist level: Solves basic 25 - 49% of the time - needs direction more than half the time to initiate, plan or complete simple activities  Memory Memory assist level: Recognizes or recalls 25 - 49% of the time/requires cueing 50 - 75% of the time   Short Term Goals: Week 1: SLP Short Term Goal 1 (Week 1): Patient will consume Dys.1 textures and pudding-thick liquids with Min multimodal cues for pacing and portion control to minimize overt s/s of aspiration.   SLP Short Term Goal 2 (Week 1): Patient will vocalize in 10% of opportunities with Max assit multimodal cues   SLP Short Term Goal 3 (Week 1): Patient will identify named object from a field of 3 with Max assist multimodal cues  SLP Short Term Goal 4 (Week 1): Patient will follow 1-step directions during familiar tasks with Max assist gesture cues  Refer to Care Plan for Long Term Goals  Recommendations for other services: None  Discharge Criteria: Patient will be discharged from SLP if patient refuses treatment 3 consecutive times without medical reason, if treatment goals not met, if there is a change in medical status, if patient makes no progress towards goals or if patient is discharged from hospital.  The above assessment, treatment plan, treatment alternatives and goals were discussed and mutually agreed upon: No family available/patient unable  Carmelia Roller., [place]  [place] 05/18/2015, 3:58 PM

## 2015-05-18 NOTE — Care Management Note (Signed)
[facility] Individual Statement of Services  Patient Name:  Kerry [name]  Date:  05/18/2015  Welcome to the [facility].  Our goal is to provide you with an individualized program based on your diagnosis and situation, designed to meet your specific needs.  With this comprehensive rehabilitation program, you will be expected to participate in at least 3 hours of rehabilitation therapies Monday-Friday, with modified therapy programming on the weekends.  Your rehabilitation program will include the following services:  Physical Therapy (PT), Occupational Therapy (OT), Speech Therapy (ST), 24 hour per day rehabilitation nursing, Therapeutic Recreaction (TR), Neuropsychology, Case Management (Social Worker), Rehabilitation Medicine, Nutrition Services and Pharmacy Services  Weekly team conferences will be held on Wednesday to discuss your progress.  Your Social Worker will talk with you frequently to get your input and to update you on team discussions.  Team conferences with you and your family in attendance may also be held.  Expected length of stay: 21-25 days  Overall anticipated outcome: min level  Depending on your progress and recovery, your program may change. Your Social Worker will coordinate services and will keep you informed of any changes. Your Social Worker's name and contact numbers are listed  below.  The following services may also be recommended but are not provided by the [facility] will be made to provide these services after discharge if needed.  Arrangements include referral to agencies that provide these services.  Your insurance has been verified to be:  [employer] Your primary doctor is:  Lucia Gaskins  Pertinent information will be shared with your doctor and your insurance  company.  Social Worker:  Ovidio Kin, [place] or ([phone]  Information discussed with and copy given to patient by: Elease Hashimoto, 05/18/2015, 11:12 AM

## 2015-05-19 ENCOUNTER — Inpatient Hospital Stay (HOSPITAL_COMMUNITY): Payer: 59 | Admitting: Occupational Therapy

## 2015-05-19 ENCOUNTER — Inpatient Hospital Stay (HOSPITAL_COMMUNITY): Payer: 59 | Admitting: Speech Pathology

## 2015-05-19 ENCOUNTER — Inpatient Hospital Stay (HOSPITAL_COMMUNITY): Payer: 59 | Admitting: *Deleted

## 2015-05-19 LAB — URINE CULTURE

## 2015-05-19 LAB — GLUCOSE, CAPILLARY
GLUCOSE-CAPILLARY: 114 mg/dL — AB (ref 65–99)
Glucose-Capillary: 136 mg/dL — ABNORMAL HIGH (ref 65–99)
Glucose-Capillary: 100 mg/dL — ABNORMAL HIGH (ref 65–99)
Glucose-Capillary: 140 mg/dL — ABNORMAL HIGH (ref 65–99)

## 2015-05-19 MED ORDER — PAROXETINE HCL 10 MG PO TABS
10.0000 mg | ORAL_TABLET | Freq: Every day | ORAL | Status: DC
  Administered 2015-05-19 – 2015-06-04 (×17): 10 mg
  Filled 2015-05-19 (×21): qty 1

## 2015-05-19 NOTE — Progress Notes (Signed)
Physical Therapy Session Note  Patient Details  Name: Kerry [name] MRN: [id number] Date of Birth: 04-Mar-1956  Today's Date: 05/19/2015 PT Individual Time: 1330-1445 PT Individual Time Calculation (min): 75 min    Skilled Therapeutic Interventions/Progress Updates:  Patient resting in bed at the beginning of the session, rolling in bed to L side with mod to max A, sitting EOB with max A, transfer to w/c with max A and second person for safety.  In therapy gym transfer to sitting EOM in order to increase core strengthening and righening reactions with visual feedback wile performing reaching for cones on L side with manual  assistance to engage L lumbar flexors. Card placement on back of mirror with min A for postural corrections. Weight shifting and positional recovery in sitting with visual feed back mod A to recover from R side and back leaning.  Transfer to supine with max A, PNF techniques for R LE in order to increase active movement in functional pattern, no tone noted.  Sit to stand training 3 x with max A and standing with max A and manual R knee locking assistance placed on R obliques. Gait training initiated with L side rail and therapist under arm , with max A for R LE progression and knee locking during stance on R to prevent buckling , patient covered distance of 10 feet max A and close w/c follow. Patient placed in a manual ,low w/c in order to train hemi style propulsion with L UE and LE, with min to mod A due to R side pull, patient able to engage in self propelling on a distance of 120 feet.  At the end of session patient returned to bed with max A, all needs within reaching ,nurse and NT present in room .  Therapy Documentation Precautions:  Precautions Precautions: Fall Precaution Comments: Peg, Rt shoulder subluxation Restrictions Weight Bearing Restrictions: No Vital Signs: Therapy Vitals Temp: 98 F (36.7 C) Temp Source: Oral Pulse Rate: (!) 110 Resp:  20 BP: 134/64 mmHg Patient Position (if appropriate): Lying Oxygen Therapy SpO2: 98 % O2 Device: Not Delivered Pain: Pain Assessment Pain Assessment: Faces Faces Pain Scale: Hurts little more Pain Type: Chronic pain Pain Location: Knee Pain Orientation: Left Pain Descriptors / Indicators: Aching Pain Intervention(s): Medication (See eMAR)   See Function Navigator for Current Functional Status.   Therapy/Group: Individual Therapy  [place] 05/19/2015, 3:36 PM

## 2015-05-19 NOTE — Progress Notes (Signed)
Speech Language Pathology Daily Session Note  Patient Details  Name: Kerry [name] MRN: [id number] Date of Birth: February 01, 1956  Today's Date: 05/19/2015 SLP Individual Time: 0800-0900 SLP Individual Time Calculation (min): 60 min  Short Term Goals: Week 1: SLP Short Term Goal 1 (Week 1): Patient will consume Dys.1 textures and pudding-thick liquids with Min multimodal cues for pacing and portion control to minimize overt s/s of aspiration.   SLP Short Term Goal 2 (Week 1): Patient will vocalize in 10% of opportunities with Max assit multimodal cues   SLP Short Term Goal 3 (Week 1): Patient will identify named object from a field of 3 with Max assist multimodal cues  SLP Short Term Goal 4 (Week 1): Patient will follow 1-step directions during familiar tasks with Max assist gesture cues  Skilled Therapeutic Interventions: Skilled ST intervention provided with focus on dysphagia and speech-lang goals. Slp assisted pt with repositioning in bed to upright 90 degrees. Assistance provided to set up and initiate meal. Pt self fed for 90% of meal with min-mod verbal/visual cues provided to slow rate, take small sips and clear L-sulci of pocketed material. Pt consumed 90% of meal with no overt s/s of aspiration. Mild pocketed material noted, which was cleared with liquid wash and manual removal using finger sweep. Pt attempted to verbalize with 0% accuracy. Pt followed 1-step instructions with 40% accuracy, given visual model.    Function:  Eating Eating   Modified Consistency Diet: Yes Eating Assist Level: Helper checks for pocketed food;Set up assist for;Supervision or verbal cues   Eating Set Up Assist For: Opening containers       Cognition Comprehension Comprehension assist level: Understands basic less than 25% of the time/ requires cueing >75% of the time  Expression   Expression assist level: Expresses basis less than 25% of the time/requires cueing >75% of the time.  Social  Interaction Social Interaction assist level: Interacts appropriately 25 - 49% of time - Needs frequent redirection.  Problem Solving Problem solving assist level: Solves basic 25 - 49% of the time - needs direction more than half the time to initiate, plan or complete simple activities  Memory      Pain Pain Assessment Pain Assessment: No/denies pain Faces Pain Scale: No hurt  Therapy/Group: Individual Therapy  Indalecio Malmstrom, [place] 05/19/2015, 8:56 AM

## 2015-05-19 NOTE — Progress Notes (Signed)
Occupational Therapy Session Note  Patient Details  Name: Kerry [name] MRN: [id number] Date of Birth: 12-30-1955  Today's Date: 05/19/2015 OT Individual Time: 0930-1030 OT Individual Time Calculation (min): 60 min    Short Term Goals: Week 1:  OT Short Term Goal 1 (Week 1): Pt will maintain static sitting balance at EOB in preparation for self-care tasks OT Short Term Goal 2 (Week 1): Pt will complete bathing with mod assist and min cues for sequencing OT Short Term Goal 3 (Week 1): Pt will complete UB dressing wiht mod assist and min cues for sequencing OT Short Term Goal 4 (Week 1): Pt will complete toilet transfer to drop arm BSC with mod assist of 1 caregiver OT Short Term Goal 5 (Week 1): Pt will position RUE in preparation for transfers with min cues  Skilled Therapeutic Interventions/Progress Updates:    ADL retraining with focus on initiation, sequencing, following one step directions, and trunk control with sitting and standing.  Pt with increase vocalizations this session in response to questions, however unintelligible and perseverative in nature.  Pt with improved initiation and sequencing with bed mobility.  +2 in sitting at EOB to complete UB bathing and dressing tasks. Incorporated reaching outside BOS to Lt to retrieve items to facilitate weight shift to Lt.  Therapist positioned in front of pt with 2nd person on pt't Lt with focus on upright sitting balance and weight shifting anterior and to Lt.  Utilized arm chair placed in front of pt to facilitate anterior weight shift as needed for sit > stand.  Completed sit > stand x5 with +2 assist with pt demonstrating increased weight shift and initiation, but requiring +2 to lift buttocks.  Family present during second half of session, utilized husband standing in front of pt to promote upright standing balance when standing.  Squat pivot transfer max assist +2 to Lt with pt reaching with LUE to facilitate weight shift for  transfer. Pt left semi reclined in tilt in space w/c with family present.  Therapy Documentation Precautions:  Precautions Precautions: Fall Precaution Comments: Peg, Rt shoulder subluxation Restrictions Weight Bearing Restrictions: No General:   Vital Signs: Therapy Vitals Pulse Rate: (!) 101 BP: 121/66 mmHg Patient Position (if appropriate): Sitting Pain: Pain Assessment Pain Assessment: No/denies pain Faces Pain Scale: No hurt  See Function Navigator for Current Functional Status.   Therapy/Group: Individual Therapy  Simonne Come 05/19/2015, 11:11 AM

## 2015-05-19 NOTE — Progress Notes (Signed)
Orthopedic Tech Progress Note Patient Details:  ARNETRA HUXFORD 07-13-1955 [id number]  Patient ID: [name], female   DOB: Apr 13, 1956, 59 y.o.   MRN: [id number]   [place] Pink 05/19/2015, 10:28 AMCalled [employer] for right PRAFO brace.

## 2015-05-19 NOTE — Progress Notes (Signed)
[place] PHYSICAL MEDICINE & REHABILITATION     PROGRESS NOTE    Subjective/Complaints: Sitting in bed. Smiling. No distress. Grabs and holds my hand Unable to perform ROS due to aphasia  Objective: Vital Signs: Blood pressure 121/66, pulse 101, temperature 97.8 F (36.6 C), temperature source Oral, resp. rate 18, weight 80.196 kg (176 lb 12.8 oz), SpO2 99 %. No results found.  Recent Labs  05/17/15 0452  WBC 9.4  HGB 11.6*  HCT 37.0  PLT 224    Recent Labs  05/17/15 0452  NA 137  K 4.9  CL 101  GLUCOSE 152*  BUN 22*  CREATININE 0.58  CALCIUM 9.6   CBG (last 3)   Recent Labs  05/18/15 1631 05/18/15 2047 05/19/15 0650  GLUCAP 120* 151* 136*    Wt Readings from Last 3 Encounters:  05/16/15 80.196 kg (176 lb 12.8 oz)  05/16/15 86.183 kg (190 lb)  04/27/15 92.5 kg (203 lb 14.8 oz)    Physical Exam:  Constitutional: She appears well-developed and well-nourished. No distress.  Sitting up in bed and comfortable  HENT:  Head: Normocephalic and atraumatic.  Mouth/Throat: Oropharynx is   Moist still with film   Missing upper teeth and multiple lower teeth.  Eyes: Conjunctivae and EOM are normal. Pupils are equal, round, and reactive to light.  Neck: Normal range of motion. Neck supple.  Cardiovascular: Normal rate and regular rhythm. Exam reveals no friction rub.  Respiratory: Effort normal. No respiratory distress. She has wheezes. Stridor, minimal air leakage today GI: Soft. Bowel sounds are normal. She exhibits no distension. There is no tenderness.  PEG site clean and dry without drainage.  Musculoskeletal: She exhibits no edema or tenderness.  Neurological: She is alert.  Did not make attempts at phonation.  Perseverative behaviors noted but able to hold comb and use to brush hair.   Apraxia noted. occasionally with an appropriate y/n nod. Poor oral-motor control Inconsistently follows commands with perseveration Dense right hemiparesis  0/5. Right heel cord tight.  Lacks insight and awareness of deficits.  Unable to perform motor and sensory testing, however appears to >/ 3/5 in LUE and LLE. DTRs 3+ on right side    Skin: Skin is warm and dry. No rash noted. She is not diaphoretic. No erythema.  Psychiatric: She is not slowed and not withdrawn. Cognition and memory are impaired. She is noncommunicative. She is inattentive.     Assessment/Plan: 1. 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 Physiatrist is providing close team supervision and 24 hour management of active medical problems listed below. Physiatrist and rehab team continue to assess barriers to discharge/monitor patient progress toward functional and medical goals.  Function:  Bathing Bathing position   Position: Sitting EOB (bed level for perineal hygiene, sitting EOB for rest of bathing)  Bathing parts Body parts bathed by patient: Chest, Abdomen, Right upper leg, Left upper leg Body parts bathed by helper: Front perineal area, Buttocks, Back  Bathing assist Assist Level: 2 helpers (+2 for sitting balance at EOB)      Upper Body Dressing/Undressing Upper body dressing   What is the patient wearing?: Hospital gown                Upper body assist Assist Level: 2 helpers (+2 for sitting balance at EOB)      Lower Body Dressing/Undressing Lower body dressing   What is the  patient wearing?: Non-skid slipper socks, Hospital Gown           Non-skid slipper socks- Performed by helper: Don/doff right sock, Don/doff left sock                  Lower body assist Assist for lower body dressing: 2 Helpers (+2 for sitting balance while 2nd person donned socks)      Toileting Toileting          Toileting assist     Transfers Chair/bed transfer   Chair/bed transfer method: Squat pivot Chair/bed transfer assist  level: 2 helpers Chair/bed transfer assistive device: Armrests     Locomotion Ambulation Ambulation activity did not occur: Safety/medical Editor, commissioning activity did not occur: Safety/medical concerns (pt in tilt-in-space w/c, unable to propel) Type: Manual      Cognition Comprehension Comprehension assist level: Understands basic less than 25% of the time/ requires cueing >75% of the time  Expression Expression assist level: Expresses basis less than 25% of the time/requires cueing >75% of the time.  Social Interaction Social Interaction assist level: Interacts appropriately 25 - 49% of time - Needs frequent redirection.  Problem Solving Problem solving assist level: Solves basic 25 - 49% of the time - needs direction more than half the time to initiate, plan or complete simple activities  Memory Memory assist level:  (UTA- pt nonverbal)   Medical Problem List and Plan: 1. Functional deficits secondary to Left MCA infarct  -continue CIR therapies 2. DVT Prophylaxis/Anticoagulation: Pharmaceutical: Lovenox 3. Chronic DDD/Fibromyalgia/Pain Management: Used oxycodone prn at home per family. Continue tylenol prn for now  -appears comfortable at present 4. Mood: LCSW to follow for evaluation and support as mentation improves.  5. Neuropsych: This patient is not capable of making decisions on her own behalf. 6. Skin/Wound Care: Routine pressure relief measures 7. Fluids/Electrolytes/Nutrition:eating quite well with supervision  -continue H20 flushes through PEG. 8. HTN: Monitor BP bid. On Lisinopril, hydralazine and catapres. 9. DM type 2: Family reports non-compliance with insulin at home. Will monitor BS ac/hs. Continue lantus bid with glipizide BID. Use SSI for elevated BS and titrate lantus as indicated.   -good control at present.cbg's reviewed 10. Anxiety disorder: Used Paxil and ativan at home. Monitor as awareness improves.   -resume low dose  paxil--ask family about home dose 11. Acute pre-renal azotemia: increased water flushes to qid. BUN improved to 22. Re-check next week. 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

## 2015-05-20 ENCOUNTER — Inpatient Hospital Stay (HOSPITAL_COMMUNITY): Payer: 59 | Admitting: Occupational Therapy

## 2015-05-20 ENCOUNTER — Inpatient Hospital Stay (HOSPITAL_COMMUNITY): Payer: 59 | Admitting: Physical Therapy

## 2015-05-20 LAB — GLUCOSE, CAPILLARY
GLUCOSE-CAPILLARY: 126 mg/dL — AB (ref 65–99)
GLUCOSE-CAPILLARY: 131 mg/dL — AB (ref 65–99)
Glucose-Capillary: 83 mg/dL (ref 65–99)
Glucose-Capillary: 98 mg/dL (ref 65–99)

## 2015-05-20 MED ORDER — SALINE SPRAY 0.65 % NA SOLN
1.0000 | Freq: Three times a day (TID) | NASAL | Status: DC
  Administered 2015-05-20 – 2015-06-06 (×34): 1 via NASAL
  Filled 2015-05-20 (×2): qty 44

## 2015-05-20 MED ORDER — FLUCONAZOLE 100 MG PO TABS
100.0000 mg | ORAL_TABLET | Freq: Every day | ORAL | Status: AC
  Administered 2015-05-20 – 2015-05-21 (×2): 100 mg via ORAL
  Filled 2015-05-20 (×2): qty 1

## 2015-05-20 NOTE — Progress Notes (Signed)
[place] PHYSICAL MEDICINE & REHABILITATION     PROGRESS NOTE    Subjective/Complaints: Pt sitting in bed. Family with her today. Family reports that she's picking at her left nare---likely because it's dry. No discharge or bleeding seen  Unable to perform ROS due to aphasia  Objective: Vital Signs: Blood pressure 133/71, pulse 93, temperature 97.8 F (36.6 C), temperature source Oral, resp. rate 16, weight 80.196 kg (176 lb 12.8 oz), SpO2 98 %. No results found. No results for input(s): WBC, HGB, HCT, PLT in the last 72 hours. No results for input(s): NA, K, CL, GLUCOSE, BUN, CREATININE, CALCIUM in the last 72 hours.  Invalid input(s): CO CBG (last 3)   Recent Labs  05/19/15 1634 05/19/15 2100 05/20/15 0634  GLUCAP 140* 114* 126*    Wt Readings from Last 3 Encounters:  05/16/15 80.196 kg (176 lb 12.8 oz)  05/16/15 86.183 kg (190 lb)  04/27/15 92.5 kg (203 lb 14.8 oz)    Physical Exam:  Constitutional: She appears well-developed and well-nourished. No distress.  Sitting up in bed and comfortable  HENT:  Head: Normocephalic and atraumatic.  Mouth/Throat: Oropharynx is   Moist still with white film on tongue   Missing upper teeth and multiple lower teeth.  Eyes: Conjunctivae and EOM are normal. Pupils are equal, round, and reactive to light.  Neck: Normal range of motion. Neck supple.  Cardiovascular: Normal rate and regular rhythm. Exam reveals no friction rub.  Respiratory: Effort normal. No respiratory distress. She makes stridorous sounds when breathing GI: Soft. Bowel sounds are normal. She exhibits no distension. There is no tenderness.  PEG site clean and dry without drainage.  Musculoskeletal: She exhibits no edema or tenderness.  Neurological: She is alert.  Did not make attempts at phonation.  Perseverative behaviors noted but able to hold comb and use to brush hair.   Apraxia noted. occasionally with an appropriate y/n nod. Poor oral-motor  control Inconsistently follows commands with perseveration Dense right hemiparesis 0/5. Right heel cord tight.  Lacks insight and awareness of deficits.  Unable to perform motor and sensory testing, however appears to >/ 3/5 in LUE and LLE. DTRs 3+ on right side    Skin: Skin is warm and dry. No rash noted. She is not diaphoretic. No erythema.  Psychiatric: She is not slowed and not withdrawn. Cognition and memory are impaired. She is noncommunicative. She is inattentive to right.     Assessment/Plan: 1. 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 Physiatrist is providing close team supervision and 24 hour management of active medical problems listed below. Physiatrist and rehab team continue to assess barriers to discharge/monitor patient progress toward functional and medical goals.  Function:  Bathing Bathing position   Position: Sitting EOB  Bathing parts Body parts bathed by patient: Chest, Abdomen, Right arm (only completed UB bathing this session) Body parts bathed by helper: Front perineal area, Buttocks, Back  Bathing assist Assist Level: 2 helpers (for sitting balance)      Upper Body Dressing/Undressing Upper body dressing   What is the patient wearing?: Pull over shirt/dress     Pull over shirt/dress - Perfomed by patient: Thread/unthread left sleeve, Put head through opening Pull over shirt/dress - Perfomed by helper: Thread/unthread right sleeve, Pull shirt over trunk        Upper body assist Assist Level: 2 helpers (for sitting balance)  Lower Body Dressing/Undressing Lower body dressing   What is the patient wearing?: Non-skid slipper socks, Hospital Gown           Non-skid slipper socks- Performed by helper: Don/doff right sock, Don/doff left sock                  Lower body assist Assist for lower body dressing: 2  Helpers (+2 for sitting balance while 2nd person donned socks)      Toileting Toileting Toileting activity did not occur: No continent bowel/bladder event        Toileting assist     Transfers Chair/bed transfer   Chair/bed transfer method: Squat pivot Chair/bed transfer assist level: 2 helpers Chair/bed transfer assistive device: Armrests     Locomotion Ambulation Ambulation activity did not occur: Safety/medical Editor, commissioning activity did not occur: Safety/medical concerns (pt in tilt-in-space w/c, unable to propel) Type: Manual      Cognition Comprehension Comprehension assist level: Understands basic less than 25% of the time/ requires cueing >75% of the time  Expression Expression assist level: Expresses basis less than 25% of the time/requires cueing >75% of the time.  Social Interaction Social Interaction assist level: Interacts appropriately 25 - 49% of time - Needs frequent redirection.  Problem Solving Problem solving assist level: Solves basic 25 - 49% of the time - needs direction more than half the time to initiate, plan or complete simple activities  Memory Memory assist level: Recognizes or recalls 25 - 49% of the time/requires cueing 50 - 75% of the time   Medical Problem List and Plan: 1. Functional deficits secondary to Left MCA infarct  -continue CIR therapies 2. DVT Prophylaxis/Anticoagulation: Pharmaceutical: Lovenox 3. Chronic DDD/Fibromyalgia/Pain Management: Used oxycodone prn at home per family. Continue tylenol prn for now  -appears comfortable at present 4. Mood: LCSW to follow for evaluation and support as mentation improves.  5. Neuropsych: This patient is not capable of making decisions on her own behalf. 6. Skin/Wound Care: Routine pressure relief measures  -ocean nasal spray for dry nasal mucosa 7. Fluids/Electrolytes/Nutrition:eating quite well with supervision  -continue H20 flushes through PEG. 8. HTN:  Monitor BP bid. On Lisinopril, hydralazine and catapres. 9. DM type 2: Family reports non-compliance with insulin at home. Will monitor BS ac/hs. Continue lantus bid with glipizide BID. Use SSI for elevated BS and titrate lantus as indicated.   -good control at present.cbg's reviewed 10. Anxiety disorder: Used Paxil and ativan at home. Monitor as awareness improves.   -resumed low dose paxil--follow up with family about home dose 11. Acute pre-renal azotemia: increased water flushes to qid. BUN improved to 22. Re-check next week. 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 Thrush: 2 days of diflucan   LOS (Days) 4 A FACE TO FACE EVALUATION WAS PERFORMED  SWARTZ,ZACHARY T 05/20/2015 8:26 AM

## 2015-05-20 NOTE — Progress Notes (Signed)
At 0225 bladder scan=539, incontinent void, rechecked PVR=204. No I&O cath needed at this time. Family at bedside.Kerry [name] A

## 2015-05-20 NOTE — Progress Notes (Signed)
Occupational Therapy Session Note  Patient Details  Name: Kerry [name] MRN: [id number] Date of Birth: April 19, 1956  Today's Date: 05/20/2015 OT Individual Time: [phone] and 1455-1555 OT Individual Time Calculation (min): 60 min and 60 min   Short Term Goals: Week 1:  OT Short Term Goal 1 (Week 1): Pt will maintain static sitting balance at EOB in preparation for self-care tasks OT Short Term Goal 2 (Week 1): Pt will complete bathing with mod assist and min cues for sequencing OT Short Term Goal 3 (Week 1): Pt will complete UB dressing wiht mod assist and min cues for sequencing OT Short Term Goal 4 (Week 1): Pt will complete toilet transfer to drop arm BSC with mod assist of 1 caregiver OT Short Term Goal 5 (Week 1): Pt will position RUE in preparation for transfers with min cues  Skilled Therapeutic Interventions/Progress Updates:  Session 1: Upon entering the room, pt supine in bed with no c/o pain. RN present in room to give pt medications and family present as well. Skilled OT session with focus on self care retraining, bed mobility, functional transfers, weight shift, and pt/family education. Family members stepping out while therapist assisted with self care. Pt attempting to assist with washing UB but requiring increased assistance secondary to thoroughness. Pt required mod A to roll L <>R this session. Pt incontinent of bowel in brief and requiring total A for clean up. LB clothing donned while in bed. Squat pivot transfer bed >wheelchair with +2 assist for safety. Pt completed UB bathing and dressing while seated in wheelchair at sink. Pt requiring mod verbal cues for initiation this session. OT educated caregivers on OT purpose, POC, goals, and CVA related questions. Pt remained in wheelchair with family present in room. Call bell and all needed items within reach upon exiting the room.   Session 2: Upon entering the room, pt supine in bed and points to R UE when asked if  hurting. Pt does not show any other signs or symptoms of pain during session. Pt performed supine >sit with manual facilitation a verbal cues for proper technique. Once seated on EOB, pt holding onto bedrail with L UE for 5 minutes with supervision - min A. Bedrail removed and pt requiring min- mod A to sit unsupported with verbal and tactile cues and manual faciliation for weight shift to the L while seated. Pt performed toilet transfer bed <>drop arm commode chair x 2 reps with Max A and second person holding equipment for safety. Pt requiring rest breaks secondary to fatigue. Pt then engaged in 2 reps of sit <>stand while able to stand for 45 sec and then 37 seconds respectively with R knee blocked and facilitation at trunk for upright posture. Pt with increasing fatigue at end of session. Sit >supine with max A for B LEs into bed. Bed alarm activated and call bell with all needed items within reach upon exiting the room.   Therapy Documentation Precautions:  Precautions Precautions: Fall Precaution Comments: Peg, Rt shoulder subluxation Restrictions Weight Bearing Restrictions: No Vital Signs: Therapy Vitals Temp: 97.8 F (36.6 C) Temp Source: Oral Pulse Rate: 93 Resp: 16 BP: 133/71 mmHg Patient Position (if appropriate): Sitting Oxygen Therapy SpO2: 98 % O2 Device: Not Delivered Pain: Pain Assessment Pain Assessment: Faces Faces Pain Scale: Hurts little more Pain Type: Chronic pain Pain Location: Knee Pain Orientation: Left Pain Intervention(s): Medication (See eMAR)  See Function Navigator for Current Functional Status.   Therapy/Group: Individual Therapy  Phineas Semen  05/20/2015, 9:01 AM

## 2015-05-20 NOTE — Progress Notes (Signed)
Physical Therapy Session Note  Patient Details  Name: Kerry [name] MRN: [id number] Date of Birth: 10-15-1955  Today's Date: 05/20/2015 PT Individual Time: 1300-1400 PT Individual Time Calculation (min): 60 min   Short Term Goals: Week 1:  PT Short Term Goal 1 (Week 1): Pt will perform supine <>sit consistent minA with bedrails and HOB elevated as needed PT Short Term Goal 2 (Week 1): Pt will perform squat pivot transfer w/c <>bed with maxA of one person PT Short Term Goal 3 (Week 1): Pt will perform sit <>stand with modA +2 for safety and R extremities management PT Short Term Goal 4 (Week 1): Pt will initiate gait training x10' with LRAD and +2 A  Skilled Therapeutic Interventions/Progress Updates:   Session focused on functional transfers, sitting balance, RUE/RLE NMR, trunk activation with reaching tasks, sit <> stand, and activity tolerance. Patient demonstrated good initiation and participation with squat pivot transfers for scooting to edge of surface and reaching for arm rests, max A with +2 for safety. Performed passive pec stretch with patient seated edge of mat and therapist sitting on physioball behind patient for improved upright posture due to rounded shoulders and kyphotic forward flexed posture. Patient performed sitting balance edge of mat with supervision with wedge placed under R hip and 4" step under feet progressed to reaching slightly outside BOS to R to facilitate R lateral trunk elongation and L lateral trunk shortening, RUE supported on pillows with dycem to keep hand in place and attempted reaching across midline to R with LUE with therapist facilitating optimal alignment of R shoulder joint to promote weightbearing through R forearm but patient compensating by shifting weight onto R hip only with no WB through RUE. Patient performed sit <> stand from edge of mat with Stedy and +2A for safety with therapist assisting RUE and rehab tech providing max A, patient able to  maintain standing x 2 trials between 1-2 min with max multimodal cues for upright posture and forward gaze. Patient transferred to [facility] wheelchair via [facility] and performed squat pivot transfer back to bed with +2A, max A for sit > supine and patient assisting with repositioning higher in bed with LUE and pushing through LLE. Patient left semi reclined in bed with RUE supported on pillows and family present.   Therapy Documentation Precautions:  Precautions Precautions: Fall Precaution Comments: Peg, Rt shoulder subluxation Restrictions Weight Bearing Restrictions: No Pain: Pain Assessment Pain Assessment: Faces Faces Pain Scale: Hurts little more Pain Type: Chronic pain Pain Location: Back Pain Orientation: Lower Pain Descriptors / Indicators: Other (Comment) (per family) Pain Onset: With Activity Pain Intervention(s): Rest;Repositioned   See Function Navigator for Current Functional Status.   Therapy/Group: Individual Therapy  Laretta Alstrom 05/20/2015, 4:17 PM

## 2015-05-21 ENCOUNTER — Inpatient Hospital Stay (HOSPITAL_COMMUNITY): Payer: 59 | Admitting: Physical Therapy

## 2015-05-21 ENCOUNTER — Inpatient Hospital Stay (HOSPITAL_COMMUNITY): Payer: 59 | Admitting: Occupational Therapy

## 2015-05-21 ENCOUNTER — Inpatient Hospital Stay (HOSPITAL_COMMUNITY): Payer: 59 | Admitting: Speech Pathology

## 2015-05-21 LAB — BASIC METABOLIC PANEL
ANION GAP: 9 (ref 5–15)
BUN: 23 mg/dL — ABNORMAL HIGH (ref 6–20)
CALCIUM: 9.7 mg/dL (ref 8.9–10.3)
CO2: 27 mmol/L (ref 22–32)
Chloride: 102 mmol/L (ref 101–111)
Creatinine, Ser: 0.63 mg/dL (ref 0.44–1.00)
GLUCOSE: 100 mg/dL — AB (ref 65–99)
POTASSIUM: 4 mmol/L (ref 3.5–5.1)
SODIUM: 138 mmol/L (ref 135–145)

## 2015-05-21 LAB — GLUCOSE, CAPILLARY
GLUCOSE-CAPILLARY: 94 mg/dL (ref 65–99)
GLUCOSE-CAPILLARY: 104 mg/dL — AB (ref 65–99)
GLUCOSE-CAPILLARY: 90 mg/dL (ref 65–99)
Glucose-Capillary: 103 mg/dL — ABNORMAL HIGH (ref 65–99)

## 2015-05-21 MED ORDER — AMLODIPINE BESYLATE 5 MG PO TABS
5.0000 mg | ORAL_TABLET | Freq: Every day | ORAL | Status: DC
  Administered 2015-05-21 – 2015-06-04 (×15): 5 mg via ORAL
  Filled 2015-05-21 (×15): qty 1

## 2015-05-21 MED ORDER — TRAMADOL 5 MG/ML ORAL SUSPENSION: 25.0000 mg | Freq: Four times a day (QID) | ORAL | Status: DC

## 2015-05-21 MED ORDER — CLONIDINE HCL 0.1 MG PO TABS
0.1000 mg | ORAL_TABLET | Freq: Two times a day (BID) | ORAL | Status: DC
  Administered 2015-05-21 – 2015-06-04 (×29): 0.1 mg via ORAL
  Filled 2015-05-21 (×29): qty 1

## 2015-05-21 MED ORDER — TRAMADOL HCL 50 MG PO TABS
25.0000 mg | ORAL_TABLET | Freq: Four times a day (QID) | ORAL | Status: DC
  Administered 2015-05-21 – 2015-06-07 (×64): 25 mg via ORAL
  Filled 2015-05-21 (×67): qty 1

## 2015-05-21 MED ORDER — RESOURCE THICKENUP CLEAR PO POWD
ORAL | Status: DC | PRN
  Filled 2015-05-21: qty 125

## 2015-05-21 MED ORDER — CETYLPYRIDINIUM CHLORIDE 0.05 % MT LIQD
7.0000 mL | Freq: Two times a day (BID) | OROMUCOSAL | Status: DC
  Administered 2015-05-21 – 2015-06-06 (×31): 7 mL via OROMUCOSAL

## 2015-05-21 NOTE — Plan of Care (Signed)
Problem: RH BOWEL ELIMINATION Goal: RH STG MANAGE BOWEL WITH ASSISTANCE STG Manage Bowel with max Assistance.  Outcome: Not Progressing Incontinent.   Problem: RH BLADDER ELIMINATION Goal: RH STG MANAGE BLADDER WITH ASSISTANCE STG Manage Bladder With max Assistance  Outcome: Not Progressing Incontinent requiring total assist. PRN I & O caths.

## 2015-05-21 NOTE — Progress Notes (Signed)
Occupational Therapy Session Note  Patient Details  Name: Kerry [name] MRN: [id number] Date of Birth: 1955/11/06  Today's Date: 05/21/2015 OT Individual Time: [id number] OT Individual Time Calculation (min): 73 min    Short Term Goals: Week 1:  OT Short Term Goal 1 (Week 1): Pt will maintain static sitting balance at EOB in preparation for self-care tasks OT Short Term Goal 2 (Week 1): Pt will complete bathing with mod assist and min cues for sequencing OT Short Term Goal 3 (Week 1): Pt will complete UB dressing wiht mod assist and min cues for sequencing OT Short Term Goal 4 (Week 1): Pt will complete toilet transfer to drop arm BSC with mod assist of 1 caregiver OT Short Term Goal 5 (Week 1): Pt will position RUE in preparation for transfers with min cues  Skilled Therapeutic Interventions/Progress Updates:    Treatment session with focus on ADL retraining, Rt shoulder positioning, and trunk control in sitting and standing.  Bathing and dressing completed at seated level with increased initiation and sequencing.  Sit > stand at sink with +2 for stability and safety, use of mirror for visual input for upright standing and therapist supporting RUE.  Grooming tasks completed with setup.  Squat pivot to therapy mat with +2 to assist with anterior weight shift and trunk control.  Engaged in reaching activity with focus on trunk control and elongation with reaching.  Progressed to sit > stand utilizing arm chair placed in front to promote anterior weight shift with Lt hand on arm rest.  +2 on either side with RUE over therapist's shoulders to decrease harm and 2nd person providing assist with weight shift and lift off from mat.  Utilized Geologist, engineering for visual feedback for upright standing balance, engaged in weight shifting in standing to promote LLE strengthening and control.  Therapy Documentation Precautions:  Precautions Precautions: Fall Precaution Comments: Peg, Rt shoulder  subluxation Restrictions Weight Bearing Restrictions: No General:   Vital Signs: Therapy Vitals Pulse Rate: (!) 117 BP: 127/76 mmHg Patient Position (if appropriate): Sitting Pain: Pain Assessment Pain Assessment: Faces Faces Pain Scale: Hurts a little bit  See Function Navigator for Current Functional Status.   Therapy/Group: Individual Therapy  Simonne Come 05/21/2015, 12:05 PM

## 2015-05-21 NOTE — Progress Notes (Signed)
Speech Language Pathology Daily Session Note  Patient Details  Name: Kerry [name] MRN: [id number] Date of Birth: 1955-07-01  Today's Date: 05/21/2015 SLP Individual Time: [id number] SLP Individual Time Calculation (min): 57 min  Short Term Goals: Week 1: SLP Short Term Goal 1 (Week 1): Patient will consume Dys.1 textures and pudding-thick liquids with Min multimodal cues for pacing and portion control to minimize overt s/s of aspiration.   SLP Short Term Goal 2 (Week 1): Patient will vocalize in 10% of opportunities with Max assit multimodal cues   SLP Short Term Goal 3 (Week 1): Patient will identify named object from a field of 3 with Max assist multimodal cues  SLP Short Term Goal 4 (Week 1): Patient will follow 1-step directions during familiar tasks with Max assist gesture cues  Skilled Therapeutic Interventions:  Pt was seen for skilled ST targeting goals for communication and dysphagia.  SLP completed skilled observations during presentations of pt's currently prescribed diet.  Pt consumed dys 1 textures and pudding thick liquids with min assist verbal cues for use of swallowing precautions.  No overt s/s of aspiration were evident with liquids or purees.  During meal, pt was noted to initiate multimodal communication to indicate likes/dislikes and when she was finished eating.  Pt was able to elevate and protrude tongue as well as open and close mouth on command with max assist multimodal cues in preparation for production of phonemes.  Pt was able to produce rounded vowel /u/ x4 in isolation with max assist multimodal cues to facilitate phonemic placement.  Pt was also able to produce the automatic phrase "I love you" x10 in unison with SLP with max assist multimodal cues.  Pt's verbal expression continues to be limited by perseveration of jargon; however, her awareness of verbal errors was noted to slowly improve within session as evidenced by pt's intermittent attempts to self  correct.  Pt was left in bed with bed alarm set and call bell within reach.  Continue per current plan of care.    Function:  Eating Eating   Modified Consistency Diet: Yes Eating Assist Level: Supervision or verbal cues;Set up assist for   Eating Set Up Assist For: Opening containers       Cognition Comprehension Comprehension assist level: Understands basic 25 - 49% of the time/ requires cueing 50 - 75% of the time  Expression   Expression assist level: Expresses basis less than 25% of the time/requires cueing >75% of the time.  Social Interaction Social Interaction assist level: Interacts appropriately 50 - 74% of the time - May be physically or verbally inappropriate.  Problem Solving Problem solving assist level: Solves basic 25 - 49% of the time - needs direction more than half the time to initiate, plan or complete simple activities  Memory Memory assist level: Recognizes or recalls less than 25% of the time/requires cueing greater than 75% of the time    Pain Pain Assessment Pain Assessment: Faces Faces Pain Scale: No hurt  Therapy/Group: Individual Therapy  Shahram Alexopoulos, Selinda Orion 05/21/2015, 3:05 PM

## 2015-05-21 NOTE — Progress Notes (Signed)
At 2000, BP=118/58, scheduled Clonidine given. At 2250, BP=85/48, apresoline due. Paged Dr. Naaman Plummer R/T BP, orders to hold med. Stridor-like breath sounds. O2 sats 98% RA. White coating to tongue, used suction swab. Bladder scan=334, no I & O cath at this time. Dressing changed to old trach site-healing without problems, small scab noted, new dressing applied. PEG dressing CD&I. At 0015, no void, bladder scan=382cc's, I & O cath=350cc's. 0257 appeared uncomfortable, PRN tylenol given and repositioned. Family at bedside. Patrici Ranks A

## 2015-05-21 NOTE — Progress Notes (Signed)
[facility] PHYSICAL MEDICINE & REHABILITATION     PROGRESS NOTE    Subjective/Complaints: Family at bedside, states that poor sleep was due to left lower extremity pain. History of lumbar degenerative disc.Marland Kitchen Patient remains severely aphasic cannot give history. Discussed strategies regarding pain control and patient with neurologic deficits, minimizing medications which interfere with cognition.  Unable to perform ROS due to aphasia  Objective: Vital Signs: Blood pressure 103/55, pulse 74, temperature 97.9 F (36.6 C), temperature source Oral, resp. rate 19, weight 80.196 kg (176 lb 12.8 oz), SpO2 99 %. No results found. No results for input(s): WBC, HGB, HCT, PLT in the last 72 hours.  Recent Labs  05/21/15 0640  NA 138  K 4.0  CL 102  GLUCOSE 100*  BUN 23*  CREATININE 0.63  CALCIUM 9.7   CBG (last 3)   Recent Labs  05/20/15 1627 05/20/15 2119 05/21/15 0709  GLUCAP 98 131* 94    Wt Readings from Last 3 Encounters:  05/16/15 80.196 kg (176 lb 12.8 oz)  05/16/15 86.183 kg (190 lb)  04/27/15 92.5 kg (203 lb 14.8 oz)    Physical Exam:  Constitutional: She appears well-developed and well-nourished. No distress.  Sitting up in bed and comfortable  HENT:  Head: Normocephalic and atraumatic.  Mouth/Throat: Oropharynx is   Moist still with white film on tongue   Missing upper teeth and multiple lower teeth.  Eyes: Conjunctivae and EOM are normal. Pupils are equal, round, and reactive to light.  Neck: Normal range of motion. Neck supple.  Cardiovascular: Normal rate and regular rhythm. Exam reveals no friction rub.  Respiratory: Effort normal. No respiratory distress. She makes stridorous sounds when breathing GI: Soft. Bowel sounds are normal. She exhibits no distension. There is no tenderness.  PEG site clean and dry without drainage.  Musculoskeletal: She exhibits no edema or tenderness.  Neurological: She is alert.  Did not make attempts at phonation.   Perseverative behaviors noted but able to hold comb and use to brush hair.   Apraxia noted. occasionally with an appropriate y/n nod. Poor oral-motor control Inconsistently follows commands with perseveration . Right heel cord tight.  Lacks insight and awareness of deficits.  Unable to perform motor and sensory testing, Grossly 4/5 in LUE and LLE.No active movement right upper or right lower extremity Withdraws to pinch in the left upper and left lower but not in the right upper and right lower DTRs 3+ on right side    Skin: Skin is warm and dry. No rash noted. She is not diaphoretic. No erythema.  Psychiatric: She is not slowed and not withdrawn. Cognition and memory are impaired. She is noncommunicative. She is inattentive to right.     Assessment/Plan: 1. 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 Physiatrist is providing close team supervision and 24 hour management of active medical problems listed below. Physiatrist and rehab team continue to assess barriers to discharge/monitor patient progress toward functional and medical goals.  Function:  Bathing Bathing position   Position: Other (comment) (LB supine and UB at sink in wheelchair)  Bathing parts Body parts bathed by patient: Chest, Abdomen, Right arm, Left upper leg, Right upper leg Body parts bathed by helper: Left arm, Front perineal area, Buttocks, Right lower leg, Left lower leg, Back  Bathing assist Assist Level:  (max a)      Upper Body Dressing/Undressing Upper body dressing  What is the patient wearing?: Pull over shirt/dress     Pull over shirt/dress - Perfomed by patient: Thread/unthread left sleeve, Put head through opening Pull over shirt/dress - Perfomed by helper: Thread/unthread right sleeve, Pull shirt over trunk        Upper body assist Assist Level:  (mod A)      Lower  Body Dressing/Undressing Lower body dressing   What is the patient wearing?: Pants, Socks, Shoes       Pants- Performed by helper: Thread/unthread right pants leg, Thread/unthread left pants leg, Pull pants up/down, Fasten/unfasten pants   Non-skid slipper socks- Performed by helper: Don/doff right sock, Don/doff left sock       Shoes - Performed by helper: Don/doff right shoe, Don/doff left shoe, Fasten right, Fasten left          Lower body assist Assist for lower body dressing:  (total)      Toileting Toileting Toileting activity did not occur: No continent bowel/bladder event        Toileting assist     Transfers Chair/bed transfer   Chair/bed transfer method: Squat pivot Chair/bed transfer assist level: 2 helpers Chair/bed transfer assistive device: Armrests     Locomotion Ambulation Ambulation activity did not occur: Safety/medical Editor, commissioning activity did not occur: Safety/medical concerns (pt in tilt-in-space w/c, unable to propel) Type: Manual   Assist Level: Dependent (Pt equals 0%) (TIS wheelchair)  Cognition Comprehension Comprehension assist level: Understands basic less than 25% of the time/ requires cueing >75% of the time  Expression Expression assist level: Expresses basis less than 25% of the time/requires cueing >75% of the time.  Social Interaction Social Interaction assist level: Interacts appropriately 25 - 49% of time - Needs frequent redirection.  Problem Solving Problem solving assist level: Solves basic 25 - 49% of the time - needs direction more than half the time to initiate, plan or complete simple activities  Memory Memory assist level: Recognizes or recalls 25 - 49% of the time/requires cueing 50 - 75% of the time   Medical Problem List and Plan: 1. Functional deficits secondary to Left MCA infarct, Right hemiplegia, apraxia, aphasia  -continue CIR therapies 2. DVT Prophylaxis/Anticoagulation:  Pharmaceutical: Lovenox 3. Chronic DDD/Fibromyalgia/Pain Management: Used oxycodone prn at home per family. Continue tylenol prn for now  -appears comfortable at present 4. Mood: LCSW to follow for evaluation and support as mentation improves.  5. Neuropsych: This patient is not capable of making decisions on her own behalf. 6. Skin/Wound Care: Routine pressure relief measures  -ocean nasal spray for dry nasal mucosa 7. Fluids/Electrolytes/Nutrition:eating quite well with supervision  -continue H20 flushes through PEG.Will increase volume, monitor for fluid overload 8. HTN: Monitor BP bid. On Lisinopril, hydralazine and catapres.No reduce amlodipine to 5 mg, reduce Catapres to 0.1 mg 9. DM type 2: Family reports non-compliance with insulin at home. Will monitor BS ac/hs. Continue lantus bid with glipizide BID. Use SSI for elevated BS and titrate lantus as indicated.   -good control at present.cbg's reviewed CBG, well controlled at present 10. Anxiety disorder: Used Paxil and ativan at home. Monitor as awareness improves.   -resumed low dose paxil--follow up with family about home dose 11. Acute pre-renal azotemia: increased water flushes to qid. BUN improved to 22. Re-check next week. 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 Thrush: 2 days of diflucan   LOS (Days) 5 A FACE TO FACE EVALUATION WAS PERFORMED  Charlett Blake 05/21/2015 8:33 AM

## 2015-05-21 NOTE — Progress Notes (Signed)
Physical Therapy Session Note  Patient Details  Name: Kerry [name] MRN: [id number] Date of Birth: 11-09-1955  Today's Date: 05/21/2015 PT Individual Time: 0900-1000 PT Individual Time Calculation (min): 60 min   Short Term Goals: Week 1:  PT Short Term Goal 1 (Week 1): Pt will perform supine <>sit consistent minA with bedrails and HOB elevated as needed PT Short Term Goal 2 (Week 1): Pt will perform squat pivot transfer w/c <>bed with maxA of one person PT Short Term Goal 3 (Week 1): Pt will perform sit <>stand with modA +2 for safety and R extremities management PT Short Term Goal 4 (Week 1): Pt will initiate gait training x10' with LRAD and +2 A  Skilled Therapeutic Interventions/Progress Updates:    Pt received seated in bed with family present; pt unable to report pain however does demonstrate occasional grimacing with movement during, especially in [place]. Rolling in bed to R/L with minA to A with changing brief and performing perineal hygiene with totalA. Supine>sit with modA and bedrails; variable min>modA for sitting balance upon reaching seated position. Squat pivot transfer modA +2 to L bed >w/c. W/c propulsion x50' with L hemi technique and minA. Sit <>stand x5 trials with Stedy using LUE to pull up on bar, maxA for boosting up however able to maintain standing position with LUE support with variable minA>close S. With increased trials pt appears distressed and in pain; vitals assessed WNL. Returned to seated position in w/c for support, and given cues for deep breathing and pt appears to relax. Returned to room totalA and remained seated in w/c with family present and all needs within reach.   Therapy Documentation Precautions:  Precautions Precautions: Fall Precaution Comments: Peg, Rt shoulder subluxation Restrictions Weight Bearing Restrictions: No Pain: Pain Assessment Pain Assessment: Faces Faces Pain Scale: Hurts a little bit    See Function Navigator for  Current Functional Status.   Therapy/Group: Individual Therapy  Luberta Mutter 05/21/2015, 10:41 AM

## 2015-05-21 NOTE — Progress Notes (Signed)
Calorie Count Note  48 hour calorie count ordered.  Diet: Dysphagia 1 with pudding-thick liquids Supplements: Magic Cup ice cream with meals  05/19/15 Breakfast: 273 kcal, 9 grams protein Lunch: 241 kcal, 7 grams protein Dinner: 196 kcal, 3 grams protein  Total intake: 710 kcal (47% of minimum estimated needs)  19 grams protein (22% of minimum estimated needs)  05/20/15 Breakfast: 246 kcal, 9 grams protein Lunch: 172 kcal, 4 grams protein Dinner: 281 kcal, 12 grams protein  Total intake: 699 kcal (47% of minimum estimated needs)  25 protein (29% of minimum estimated needs)  Patient and staff report that patient does not like roast beef or green beans. In reviewing calorie count, pt received these foods daily. Pt also reports some nausea.  Patient reports that she likes the Magic Cup ice cream somewhat. Per review of meals, she did not receive a supplement on each meal tray. RD has placed notes in meal ordering system to notify nutrition staff to provide Magic Cup at every meal and to provide a wider variety of pureed foods. RD encouraged pt to consume Magic cup to improve nutrient intake.   Nutrition Dx: Increased nutrient needs related to  (increased energy expenditure with participation of therapies ) as evidenced by estimated needs.  Goal: Pt to meet >/= 90% of their estimated nutrition needs; Unmet  Intervention:  Provide Magic Cup ice cream TID with meals Nutrition staff informed of additional puree items available in order to provide patient with more variety Encourage PO intake  Scarlette Ar RD, LDN Inpatient Clinical Dietitian Pager: [phone] After Hours Pager: [phone]

## 2015-05-22 ENCOUNTER — Inpatient Hospital Stay (HOSPITAL_COMMUNITY): Payer: 59 | Admitting: Physical Therapy

## 2015-05-22 ENCOUNTER — Inpatient Hospital Stay (HOSPITAL_COMMUNITY): Payer: 59 | Admitting: Occupational Therapy

## 2015-05-22 ENCOUNTER — Inpatient Hospital Stay (HOSPITAL_COMMUNITY): Payer: 59 | Admitting: Speech Pathology

## 2015-05-22 LAB — GLUCOSE, CAPILLARY
GLUCOSE-CAPILLARY: 88 mg/dL (ref 65–99)
GLUCOSE-CAPILLARY: 94 mg/dL (ref 65–99)
Glucose-Capillary: 89 mg/dL (ref 65–99)
Glucose-Capillary: 93 mg/dL (ref 65–99)

## 2015-05-22 NOTE — Progress Notes (Signed)
Physical Therapy Session Note  Patient Details  Name: Kerry [name] MRN: [id number] Date of Birth: 1955/09/20  Today's Date: 05/22/2015 PT Individual Time: 1300-1445 PT Individual Time Calculation (min): 105 min   Short Term Goals: Week 1:  PT Short Term Goal 1 (Week 1): Pt will perform supine <>sit consistent minA with bedrails and HOB elevated as needed PT Short Term Goal 2 (Week 1): Pt will perform squat pivot transfer w/c <>bed with maxA of one person PT Short Term Goal 3 (Week 1): Pt will perform sit <>stand with modA +2 for safety and R extremities management PT Short Term Goal 4 (Week 1): Pt will initiate gait training x10' with LRAD and +2 A  Skilled Therapeutic Interventions/Progress Updates:    Pt received supine in bed, 0/10 pain based on FACES scale due to pt nonverbal. Supine>sit modA. Squat pivot transfer bed>w/c modA +2 with improving initiation, scooting in chair. W/c propulsion x25' with L hemi technique however pt with increased difficulty steering using LLE compared to yesterdays session. Gait training with L rail in hall for 12' and 16' with maxA and w/c follow. RLE progression with totalA, however pt does demonstrate min weight acceptance and knee control. Pt demonstrating attempts to communicate however unable to respond appropriately to yes/no questions; returned to room to give pt opportunity to point to needs/wants. Questioned regarding using toilet and pt appears to say yes. Transferred w/c <>drop arm BSC with +2 modA. Sit <>stand x3 trials while donning/doffing pants and brief; requires maxA +1 for standing balance. Squat pivot transfer bed <>w/c with modA +2. Sitting balance with LUE dynamic reaching and color matching with bean bags, RUE supported on table for weight bearing and proprioception. Sit <>stand with w/c in front of pt for LUE support x4 trials with LUE reaching for horseshoes on L side; maxA for standing balance. Pt returned to room totalA. Squat pivot  transfer w/c >bed maxA +1. Sit >supine modA. Remained supine in bed with alarm intact and all needs within reach. Education with pt's mother throughout session regarding use of cueing, visual cues for posture, RUE sling and weight bearing.   Therapy Documentation Precautions:  Precautions Precautions: Fall Precaution Comments: Peg, Rt shoulder subluxation Restrictions Weight Bearing Restrictions: No Pain: Pain Assessment Pain Assessment: Faces Faces Pain Scale: No hurt   See Function Navigator for Current Functional Status.   Therapy/Group: Individual Therapy  Luberta Mutter 05/22/2015, 2:55 PM

## 2015-05-22 NOTE — Progress Notes (Signed)
Recreational Therapy Session Note  Patient Details  Name: Kerry [name] MRN: [id number] Date of Birth: 1956-03-16 Today's Date: 05/22/2015  Pt not yet appropriate for TR services and is placed on HOLD at this time.  Will continue to monitor through team for future participation.  Markham Dumlao 05/22/2015, 3:54 PM

## 2015-05-22 NOTE — Progress Notes (Signed)
Occupational Therapy Session Note  Patient Details  Name: Kerry [name] MRN: [id number] Date of Birth: 16-Jul-1955  Today's Date: 05/22/2015 OT Individual Time: [id number] OT Individual Time Calculation (min): 55 min    Short Term Goals: Week 1:  OT Short Term Goal 1 (Week 1): Pt will maintain static sitting balance at EOB in preparation for self-care tasks OT Short Term Goal 2 (Week 1): Pt will complete bathing with mod assist and min cues for sequencing OT Short Term Goal 3 (Week 1): Pt will complete UB dressing wiht mod assist and min cues for sequencing OT Short Term Goal 4 (Week 1): Pt will complete toilet transfer to drop arm BSC with mod assist of 1 caregiver OT Short Term Goal 5 (Week 1): Pt will position RUE in preparation for transfers with min cues  Skilled Therapeutic Interventions/Progress Updates:    Treatment session with focus on ADL retraining, trunk control, and safe positioning of RUE.  Bathing completed seated at EOB with focus on sitting balance, pt with increased Rt lean in sitting this session.  Encouraged reaching to bed rail at end of bed to promote weight shifting to Lt.  Pt with improved initiation with bathing and dressing tasks.  Total assist for LB dressing, however pt assisting with pulling pants up over hips in standing.  Engaged in reaching task in sitting with focus on spontaneous weigh shift with card matching activity, to promote weight shift as needed for sit > stand.  +2 with all sitting tasks and standing due to Rt lean and decreased weight shift.  Therapy Documentation Precautions:  Precautions Precautions: Fall Precaution Comments: Peg, Rt shoulder subluxation Restrictions Weight Bearing Restrictions: No General:   Vital Signs: Therapy Vitals BP: (!) 129/58 mmHg Pain: Pain Assessment Pain Assessment: Faces Pain Score: Asleep  See Function Navigator for Current Functional Status.   Therapy/Group: Individual Therapy  Simonne Come 05/22/2015, 11:06 AM

## 2015-05-22 NOTE — Progress Notes (Signed)
Speech Language Pathology Daily Session Note  Patient Details  Name: Kerry [name] MRN: [id number] Date of Birth: 1956-06-10  Today's Date: 05/22/2015 SLP Individual Time: 1000-1100 SLP Individual Time Calculation (min): 60 min  Short Term Goals: Week 1: SLP Short Term Goal 1 (Week 1): Patient will consume Dys.1 textures and pudding-thick liquids with Min multimodal cues for pacing and portion control to minimize overt s/s of aspiration.   SLP Short Term Goal 2 (Week 1): Patient will vocalize in 10% of opportunities with Max assit multimodal cues   SLP Short Term Goal 3 (Week 1): Patient will identify named object from a field of 3 with Max assist multimodal cues  SLP Short Term Goal 4 (Week 1): Patient will follow 1-step directions during familiar tasks with Max assist gesture cues  Skilled Therapeutic Interventions:  Pt was seen for skilled ST targeting goals for communication and dysphagia.  SLP facilitated the session with trials of honey thick liquids via teaspoon and cup sips to continue working towards liquids progression.  Pt demonstrated multiple swallows with both cup sips and teaspoons but no other overt s/s of compromised airway protection.  Recommend that pt continue trials of honey thick liquids at bedside with SLP only for 1-2 more days prior to advancement. Pt was able to sing short portions of familiar songs with ~75% accuracy of production in context over multiple consecutive trials.  Pt was also able to identify an object when named from a field of 3 with 70% accuracy, improving to 100% accuracy with min-mod verbal and visual cues for awareness of errors.  Pt remains unable to verbally name familiar objects, neither with nor without written aids, due to perseveration.  Pt benefits from mod verbal and visual cues to recognize verbal errors; max to total assist to correct verbal errors.  Pt required max assist verbal cues to return demonstration for use of call bell at the end  of today's session. Pt was returned to room and left upright in wheelchair with family present.   Continue per current plan of care.    Function:  Eating Eating   Modified Consistency Diet: Yes Eating Assist Level: Supervision or verbal cues;Set up assist for   Eating Set Up Assist For: Opening containers       Cognition Comprehension Comprehension assist level: Understands basic 50 - 74% of the time/ requires cueing 25 - 49% of the time  Expression   Expression assist level: Expresses basic 25 - 49% of the time/requires cueing 50 - 75% of the time. Uses single words/gestures.  Social Interaction Social Interaction assist level: Interacts appropriately 50 - 74% of the time - May be physically or verbally inappropriate.  Problem Solving Problem solving assist level: Solves basic 25 - 49% of the time - needs direction more than half the time to initiate, plan or complete simple activities  Memory Memory assist level: Recognizes or recalls less than 25% of the time/requires cueing greater than 75% of the time    Pain Pain Assessment Pain Assessment: Faces Faces Pain Scale: No hurt  Therapy/Group: Individual Therapy  Kerry [name], Selinda Orion 05/22/2015, 12:41 PM

## 2015-05-22 NOTE — Progress Notes (Addendum)
[facility] PHYSICAL MEDICINE & REHABILITATION     PROGRESS NOTE    Subjective/Complaints: Pt alone in rm, aphasic  Unable to perform ROS due to aphasia  Objective: Vital Signs: Blood pressure 129/58, pulse 85, temperature 98 F (36.7 C), temperature source Oral, resp. rate 18, weight 80.196 kg (176 lb 12.8 oz), SpO2 98 %. No results found. No results for input(s): WBC, HGB, HCT, PLT in the last 72 hours.  Recent Labs  05/21/15 0640  NA 138  K 4.0  CL 102  GLUCOSE 100*  BUN 23*  CREATININE 0.63  CALCIUM 9.7   CBG (last 3)   Recent Labs  05/21/15 1649 05/21/15 2134 05/22/15 0649  GLUCAP 90 103* 89    Wt Readings from Last 3 Encounters:  05/16/15 80.196 kg (176 lb 12.8 oz)  05/16/15 86.183 kg (190 lb)  04/27/15 92.5 kg (203 lb 14.8 oz)    Physical Exam:  Constitutional: She appears well-developed and well-nourished. No distress.  Sitting up in bed and comfortable  HENT:  Head: Normocephalic and atraumatic.  Mouth/Throat: Oropharynx is   Moist still with white film on tongue   Missing upper teeth and multiple lower teeth.  Eyes: Conjunctivae and EOM are normal. Pupils are equal, round, and reactive to light.  Neck: Normal range of motion. Neck supple.  Cardiovascular: Normal rate and regular rhythm. Exam reveals no friction rub.  Respiratory: Effort normal. No respiratory distress. She makes stridorous sounds when breathing GI: Soft. Bowel sounds are normal. She exhibits no distension. There is no tenderness.  PEG site clean and dry without drainage.  Musculoskeletal: She exhibits no edema or tenderness.  Neurological: She is alert.  Did not make attempts at phonation.  Perseverative behaviors noted but able to hold comb and use to brush hair.   Apraxia noted. occasionally with an appropriate y/n nod. Poor oral-motor control Inconsistently follows commands with perseveration . Right heel cord tight.  Lacks insight and awareness of deficits.   Unable to perform motor and sensory testing, Grossly 4/5 in LUE and LLE.No active movement right upper or right lower extremity Withdraws to pinch in the left upper and left lower but not in the right upper and right lower DTRs 3+ on right side    Skin: Skin is warm and dry. No rash noted. She is not diaphoretic. No erythema.  Psychiatric: She is not slowed and not withdrawn. Cognition and memory are impaired. She is noncommunicative. She is inattentive to right.     Assessment/Plan: 1. 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 Physiatrist is providing close team supervision and 24 hour management of active medical problems listed below. Physiatrist and rehab team continue to assess barriers to discharge/monitor patient progress toward functional and medical goals.  Function:  Bathing Bathing position   Position: Sitting EOB  Bathing parts Body parts bathed by patient: Chest, Abdomen, Right arm, Left upper leg, Right upper leg Body parts bathed by helper: Left arm  Bathing assist Assist Level:  (Max assist)      Upper Body Dressing/Undressing Upper body dressing   What is the patient wearing?: Pull over shirt/dress     Pull over shirt/dress - Perfomed by patient: Thread/unthread left sleeve, Put head through opening Pull over shirt/dress - Perfomed by helper: Thread/unthread right sleeve, Pull shirt over trunk        Upper body assist Assist Level:  (Mod assist)  Lower Body Dressing/Undressing Lower body dressing   What is the patient wearing?: Pants, Socks, Shoes       Pants- Performed by helper: Thread/unthread right pants leg, Thread/unthread left pants leg, Pull pants up/down   Non-skid slipper socks- Performed by helper: Don/doff right sock, Don/doff left sock       Shoes - Performed by helper: Don/doff right shoe, Don/doff left shoe,  Fasten right, Fasten left          Lower body assist Assist for lower body dressing: 2 Automotive engineer activity did not occur: No continent bowel/bladder event        Toileting assist     Transfers Chair/bed transfer   Chair/bed transfer method: Squat pivot Chair/bed transfer assist level: 2 helpers Chair/bed transfer assistive device: Armrests     Locomotion Ambulation Ambulation activity did not occur: Safety/medical Editor, commissioning activity did not occur: Safety/medical concerns (pt in tilt-in-space w/c, unable to propel) Type: Manual Max wheelchair distance: 50 Assist Level: Moderate assistance (Pt 50 - 74%)  Cognition Comprehension Comprehension assist level: Understands basic 25 - 49% of the time/ requires cueing 50 - 75% of the time  Expression Expression assist level: Expresses basis less than 25% of the time/requires cueing >75% of the time.  Social Interaction Social Interaction assist level: Interacts appropriately 50 - 74% of the time - May be physically or verbally inappropriate.  Problem Solving Problem solving assist level: Solves basic 25 - 49% of the time - needs direction more than half the time to initiate, plan or complete simple activities  Memory Memory assist level: Recognizes or recalls less than 25% of the time/requires cueing greater than 75% of the time   Medical Problem List and Plan: 1. Functional deficits secondary to Left MCA infarct, Right hemiplegia, apraxia, aphasia  -continue CIR therapies 2. DVT Prophylaxis/Anticoagulation: Pharmaceutical: Lovenox 3. Chronic DDD/Fibromyalgia/Pain Management: Used oxycodone prn at home per family. Continue tylenol prn for now  -appears comfortable at present 4. Mood: LCSW to follow for evaluation and support as mentation improves.  5. Neuropsych: This patient is not capable of making decisions on her own behalf. 6. Skin/Wound Care: Routine pressure  relief measures  -ocean nasal spray for dry nasal mucosa 7. Fluids/Electrolytes/Nutrition:eating quite well with supervision  -continue H20 flushes through PEG.Will increase volume, monitor for fluid overload 8. HTN: Monitor BP bid. On Lisinopril, hydralazine and catapres.No reduce amlodipine to 5 mg, reduce Catapres to 0.1 mg, Sys BP 129 this am 9. DM type 2: Family reports non-compliance with insulin at home. Will monitor BS ac/hs. Continue lantus bid with glipizide BID. Use SSI for elevated BS and titrate lantus as indicated.   -good control at present.cbg's reviewed CBG, well controlled at present, 89 this am, monitor hypoglycemia 10. Anxiety disorder: Used Paxil and ativan at home. Monitor as awareness improves.   -resumed low dose paxil--follow up with family about home dose 11. Acute pre-renal azotemia: increased water flushes to qid. BUN improved to 23.  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 Thrush: 2 days of diflucan   LOS (Days) 6 A FACE TO FACE EVALUATION WAS PERFORMED  Charlett Blake 05/22/2015 8:44 AM

## 2015-05-23 ENCOUNTER — Inpatient Hospital Stay (HOSPITAL_COMMUNITY): Payer: 59 | Admitting: Physical Therapy

## 2015-05-23 ENCOUNTER — Inpatient Hospital Stay (HOSPITAL_COMMUNITY): Payer: 59 | Admitting: Occupational Therapy

## 2015-05-23 ENCOUNTER — Inpatient Hospital Stay (HOSPITAL_COMMUNITY): Payer: 59 | Admitting: Speech Pathology

## 2015-05-23 LAB — GLUCOSE, CAPILLARY
GLUCOSE-CAPILLARY: 98 mg/dL (ref 65–99)
GLUCOSE-CAPILLARY: 103 mg/dL — AB (ref 65–99)
Glucose-Capillary: 84 mg/dL (ref 65–99)
Glucose-Capillary: 113 mg/dL — ABNORMAL HIGH (ref 65–99)

## 2015-05-23 MED ORDER — SILVER NITRATE-POT NITRATE 75-25 % EX MISC
1.0000 "application " | CUTANEOUS | Status: DC | PRN
  Filled 2015-05-23: qty 1

## 2015-05-23 MED ORDER — HYDROCERIN EX CREA
TOPICAL_CREAM | Freq: Two times a day (BID) | CUTANEOUS | Status: DC
  Administered 2015-05-23 – 2015-05-25 (×4): via TOPICAL
  Administered 2015-05-25 – 2015-05-26 (×2): 1 via TOPICAL
  Administered 2015-05-26 – 2015-06-07 (×24): via TOPICAL
  Filled 2015-05-23: qty 113

## 2015-05-23 NOTE — Progress Notes (Signed)
Orthopedic Tech Progress Note Patient Details:  Kerry [name] 1955/10/17 [id number]  Patient ID: Kerry [name], female   DOB: 1955/11/28, 59 y.o.   MRN: [id number] Called in advanced brace order; spoke with [name], [name] 05/23/2015, 9:25 AM

## 2015-05-23 NOTE — Progress Notes (Signed)
Physical Therapy Session Note  Patient Details  Name: Kerry [name] MRN: [id number] Date of Birth: 09/30/1955  Today's Date: 05/23/2015 PT Individual Time: 1130-1200 and 1303-1400 PT Individual Time Calculation (min): 30 min and 57 min (total 87 min)   Short Term Goals: Week 1:  PT Short Term Goal 1 (Week 1): Pt will perform supine <>sit consistent minA with bedrails and HOB elevated as needed PT Short Term Goal 2 (Week 1): Pt will perform squat pivot transfer w/c <>bed with maxA of one person PT Short Term Goal 3 (Week 1): Pt will perform sit <>stand with modA +2 for safety and R extremities management PT Short Term Goal 4 (Week 1): Pt will initiate gait training x10' with LRAD and +2 A  Skilled Therapeutic Interventions/Progress Updates:    1130-1200: Pt received supine in bed; no evidence of pain per FACES scale as pt is nonverbal. Supine>sit with bedrails and minA. Squat pivot transfer bed>w/c modA +2 for safety. LUE assisted with w/c propulsion x150', modA for steering. Gait with L rail and maxA +1 x15'; +2A for last 15' to A with RLE placement to reduce external rotation and A with hip extension. Stairs x3 3-inch height steps with +2A; noted increase in pushing with L extremities. Returned to room totalA. Squat pivot transfer maxA +1. Max A for scooting to L due to pushing with LUE on bedrails. Sit >supine modA. Remained supine in bed with RUE elevated, heels floated, alarm intact and all needs in reach, husband present.   1300-1400: Pt received supine in bed, no evidence of pain per FACES scale. Supine>sit modA, squat pivot transfer bed>w/c modA +2. Sit <>stand at parallel bars with LUE pulling from bar to facilitate forward trunk lean and weight shift. Standing balance with variable min>maxA +1 or 2, increased assist needed when using LUE for dynamic reaching task, blocking at R knee to prevent buckling and improve weight bearing. Pt returned to room and set-up for sit <>stand at sink  to A with changing brief after incontinent bowel movement. Noted trach site with bandage removed with minimal bleeding; RN notified and gauze with bandaid applied to site per RN orders. Sit <>stand with LUE on sink and standing balance +2A while performing hygiene and changing brief totalA. W/c propulsion with L hemi technique minA x100' with improved LLE management for steering. Remained seated in w/c with quick release belt intact and all needs within reach.   Therapy Documentation Precautions:  Precautions Precautions: Fall Precaution Comments: Peg, Rt shoulder subluxation Restrictions Weight Bearing Restrictions: No Pain: Pain Assessment Pain Assessment: Faces Faces Pain Scale: No hurt PAINAD (Pain Assessment in Advanced Dementia) Breathing: normal Negative Vocalization: none Body Language: relaxed Consolability: no need to console   See Function Navigator for Current Functional Status.   Therapy/Group: Individual Therapy  Luberta Mutter 05/23/2015, 12:12 PM

## 2015-05-23 NOTE — Progress Notes (Signed)
Social Work Patient ID: Kerry [name], female   DOB: 1955-11-12, 59 y.o.   MRN: [id number] Met with pt and husband to discuss team conference goals-min assist level and mod assist ambulation with therapy only. Discharge date 12/17. Both pleased she has a target date to shoot toward, she feels she is making good progress and is glad to be here. Husband participated in PT with pt today and Saw her attempt going up two stairs, he thought she did well, it did scare her though.  She is hopeful each time will get easier. Both are pleased and agreeable to the discharge date.

## 2015-05-23 NOTE — Progress Notes (Signed)
Speech Language Pathology Daily Session Note  Patient Details  Name: Kerry [name] MRN: [id number] Date of Birth: [birth year]  Today's Date: 05/23/2015 SLP Individual Time: 1005-1030 SLP Individual Time Calculation (min): 25 min  Short Term Goals: Week 1: SLP Short Term Goal 1 (Week 1): Patient will consume Dys.1 textures and pudding-thick liquids with Min multimodal cues for pacing and portion control to minimize overt s/s of aspiration.   SLP Short Term Goal 2 (Week 1): Patient will vocalize in 10% of opportunities with Max assit multimodal cues   SLP Short Term Goal 3 (Week 1): Patient will identify named object from a field of 3 with Max assist multimodal cues  SLP Short Term Goal 4 (Week 1): Patient will follow 1-step directions during familiar tasks with Max assist gesture cues  Skilled Therapeutic Interventions:  Pt was seen for skilled ST targeting goals for dysphagia.  SLP facilitated the session with trials of honey thick liquids via teaspoon and cup sips to continue working towards diet advancement.  Trials via teaspoon and cup sips were both characterized by intermittent episodes of multiple swallows, typically following larger boluses.  No throat clearing or coughing was evident following trials and pt demonstrated no audible wetness following consumption of liquids.  Recommend assessing toleration of honey thick liquids during a functional meal at next available appointment prior to liquids advancement.  Pt was left in wheelchair with quick release belt donned and call bell left within reach.  Continue per current plan of care.    Function:  Eating Eating   Modified Consistency Diet: Yes Eating Assist Level: Supervision or verbal cues;Set up assist for   Eating Set Up Assist For: Opening containers       Cognition Comprehension Comprehension assist level: Understands basic 50 - 74% of the time/ requires cueing 25 - 49% of the time  Expression   Expression assist  level: Expresses basic 25 - 49% of the time/requires cueing 50 - 75% of the time. Uses single words/gestures.  Social Interaction Social Interaction assist level: Interacts appropriately 50 - 74% of the time - May be physically or verbally inappropriate.  Problem Solving Problem solving assist level: Solves basic 25 - 49% of the time - needs direction more than half the time to initiate, plan or complete simple activities  Memory Memory assist level: Recognizes or recalls less than 25% of the time/requires cueing greater than 75% of the time    Pain Pain Assessment Pain Assessment: No/denies pain   Therapy/Group: Individual Therapy  Emori Mumme, Selinda Orion 05/23/2015, 12:22 PM

## 2015-05-23 NOTE — Progress Notes (Signed)
Occupational Therapy Session Note  Patient Details  Name: Kerry [name] MRN: [id number] Date of Birth: 1956-04-25  Today's Date: 05/23/2015 OT Individual Time: [phone] and 1435-1500 OT Individual Time Calculation (min): 60 min and 25 min   Short Term Goals: Week 1:  OT Short Term Goal 1 (Week 1): Pt will maintain static sitting balance at EOB in preparation for self-care tasks OT Short Term Goal 2 (Week 1): Pt will complete bathing with mod assist and min cues for sequencing OT Short Term Goal 3 (Week 1): Pt will complete UB dressing wiht mod assist and min cues for sequencing OT Short Term Goal 4 (Week 1): Pt will complete toilet transfer to drop arm BSC with mod assist of 1 caregiver OT Short Term Goal 5 (Week 1): Pt will position RUE in preparation for transfers with min cues  Skilled Therapeutic Interventions/Progress Updates:    1) ADL retraining with focus on functional transfers, sit > stand, standing balance, and increased participation in self-care tasks.  Skilled co-treat with PT clinical specialist with increased focus on trunk control with sitting and standing and improved positioning in sitting.  Squat pivot transfer mod assist to w/c with +2 (2nd person stabilizing w/c and roll-in shower chair).bathing in room shower from roll-in shower chair with pt demonstrating improved sequencing and initiation in functional setting of shower.  Sit > stand with +2 for stability while therapist assisted with washing buttocks and perineal area in standing.  Increased focus on hand placement to promote anterior weight shift and then upright standing balance.  Pt assisted with pulling pants over hips in standing with improved standing tolerance with therapist placed in front of pt and to pt's Rt to further support RUE.  Cues for breathing strategies as pt appears to have increasingly shallow breathing with activity and anxiety with increased challenge.  2) Treatment session with focus on  trunk control and sit > stand.  Engaged in sit > stand x4 with focus on anterior weight shift and trunk control as well as upright standing posture in standing.  Therapist sat in front of pt in arm chair, supporting RUE on therapist's Lt shoulder, while pt slid Lt arm forward along arm rest to facilitate anterior weight shift.  Sit > stand with mod-max assist from this position with pt requiring tactile cues for upright posture.  Performed squat pivot transfer to pt's Lt at end of session with pt requiring mod assist for facilitation of anterior weight shift and then max assist for transfer to bed.  Positioned in bed in supine with pillow providing positioning for RUE.  Therapy Documentation Precautions:  Precautions Precautions: Fall Precaution Comments: Peg, Rt shoulder subluxation Restrictions Weight Bearing Restrictions: No General:   Vital Signs: Therapy Vitals BP: 120/70 mmHg Pain: Pain Assessment Pain Assessment: Faces PAINAD (Pain Assessment in Advanced Dementia) Breathing: normal Negative Vocalization: none Body Language: relaxed Consolability: no need to console  See Function Navigator for Current Functional Status.   Therapy/Group: Individual Therapy  Simonne Come 05/23/2015, 10:49 AM

## 2015-05-23 NOTE — Patient Care Conference (Signed)
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 Hemiparesis, aphasia, and dysphagia as late effect of cerebrovascular accident (CVA) ([place])   . Essential hypertension   . Uncontrolled type 2 diabetes mellitus with ketoacidosis without coma ([place])   . Coronary artery disease involving native coronary artery of native heart with angina pectoris ([place])   . History of breast cancer   . Fibromyalgia   . Chronic pain syndrome   . Generalized anxiety disorder   . Prerenal azotemia   . Pneumonia   . Chronic respiratory failure ([place])   . Respiratory failure ([place])   . Hypokalemia   . Compromised airway   . HLD (hyperlipidemia)   . Type 2 diabetes mellitus with circulatory disorder ([place])   . Sinus bradycardia   . Asystole ([place])   . Acute respiratory failure ([place])   . Cerebral hemorrhage ([place])   . Cytotoxic cerebral edema ([place])   . Sinus pause   . Cerebrovascular accident (CVA) due to occlusion of left middle cerebral artery ([place])   . Cerebrovascular accident (CVA) due to thrombosis of precerebral artery ([place])   . Encounter for central line placement   . Encounter for feeding tube placement   . CVA (cerebral infarction) 04/11/2015  . Stroke (cerebrum) ([place]) 04/11/2015  . Aphasia    . 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 Hypertensive urgency   . Uncontrolled type 2 diabetes mellitus with hyperosmolarity without coma, without long-term current use of insulin ([place])   . Breast cancer ([place]) 11/08/2012    Expected Discharge Date: Expected Discharge Date: 06/09/15  Team Members Present: Physician leading conference: Dr. Alysia Penna Social Worker Present: Ovidio Kin, LCSW Nurse Present: Dorien Chihuahua, RN PT Present: Kem Parkinson, PT OT Present: Simonne Come, OT SLP Present: Windell Moulding, SLP PPS Coordinator present : Daiva Nakayama, RN, CRRN     Current Status/Progress Goal Weekly Team Focus  Medical   severe Right hemiparesis and aphasia  home taking meds po  improve fluid intake, may upgrade textures   Bowel/Bladder   incontinent of bowel and bladder, LBM 11/27  continent of bowel and bladder with max assist  timed toileting   Swallow/Nutrition/ Hydration   Dys 1, pudding thick liquids; full supervision for use of swallowing precautions  min assist   trials of honey thick liquids, continue assessing readiness for diet progression and repeat MBS    ADL's   mod assist bathing at bed level, +2 bathing at sit > stand level, max assist transfers, min-mod assist unsupported sitting balance  min assist overall, mod assist LB dressing and toileting  dynamic sitting balance, sit > stand, RUE NMR, transfers   Mobility   modA bed mobility, maxA +1 transfers, modA w/c propulsion, maxA +2 gait  with rail   S bed mobility, transfers/w/c propulsion/ minA, gait modA with therapy  Sitting balance, sit <>stand, transfers, gait training, RLE NMR   Communication   Mod-max assist for multimodal communication; Max-total assist for verbal expression, mod-max assist cues needed for comprehension   Mod assist   automatic sequences, receptive identification of basic objects, familiar phrases    Safety/Cognition/ Behavioral Observations  decreased awareness of deficits  and decreased safety awareness, limited by aphasia   LTG and STG for cognition deferred at this time to allow ST to address communication deficits       Pain   patient apahasic, using faces pain scale, none indicated at this time  pain less than or eaqual to 4 on scale of 0-10      Skin   MASD under R breast and groin  nio new skin injury/breakdown  assess skin q shift, apply barrier cream to groin, interdry beneath R breast      *See Care Plan and progress notes for long and short-term goals.  Barriers to Discharge: severe neurologic deficits    Possible Resolutions to Barriers:  cont rehab    Discharge Planning/Teaching Needs:  Home with family members who are here daily and observing pt in therapies and providing support. Very committed to pt      Team Discussion:  Goals-min/mod level of assist-sitting balance better, making progress in therapies and with initiation. Need to encourage to eat and drink-po intake poor. I & O cath and working on timed toileting, if in room continent with bowel. O2 levels good. Try to upgrade to honey thick liquids-trials. Pushes to the left.  Revisions to Treatment Plan:  Upgrade diet to honey thick liquids   Continued Need for Acute Rehabilitation Level of Care: The patient requires daily medical management by a physician with specialized training in physical medicine and rehabilitation for the following conditions: Daily direction of a multidisciplinary physical rehabilitation program to ensure safe treatment while eliciting the highest outcome that is of practical value to the patient.: Yes Daily medical management of patient stability for increased activity during participation in an intensive rehabilitation regime.: Yes Daily analysis of laboratory values and/or radiology reports with any subsequent need for medication adjustment of medical intervention for : Neurological problems;Other  Kamran Coker, Gardiner Rhyme 05/23/2015, 2:06 PM

## 2015-05-23 NOTE — Plan of Care (Signed)
Problem: RH BLADDER ELIMINATION Goal: RH STG MANAGE BLADDER WITH EQUIPMENT WITH ASSISTANCE STG Manage Bladder With Equipment With max Assistance  Outcome: Not Progressing Total assist with in and out caths  Every 6-8 hours

## 2015-05-23 NOTE — Progress Notes (Signed)
[place] PHYSICAL MEDICINE & REHABILITATION     PROGRESS NOTE    Subjective/Complaints: Patient eating with supervision assistance  Patient phonates with expiration  Unable to perform ROS due to aphasia  Objective: Vital Signs: Blood pressure 120/70, pulse 73, temperature 98.3 F (36.8 C), temperature source Oral, resp. rate 18, weight 80.196 kg (176 lb 12.8 oz), SpO2 95 %. No results found. No results for input(s): WBC, HGB, HCT, PLT in the last 72 hours.  Recent Labs  05/21/15 0640  NA 138  K 4.0  CL 102  GLUCOSE 100*  BUN 23*  CREATININE 0.63  CALCIUM 9.7   CBG (last 3)   Recent Labs  05/22/15 1629 05/22/15 2039 05/23/15 0643  GLUCAP 88 94 84    Wt Readings from Last 3 Encounters:  05/16/15 80.196 kg (176 lb 12.8 oz)  05/16/15 86.183 kg (190 lb)  04/27/15 92.5 kg (203 lb 14.8 oz)    Physical Exam:  Constitutional: She appears well-developed and well-nourished. No distress.  Sitting up in bed and comfortable  HENT: Trach site is clean no evidence of a fairly Head: Normocephalic and atraumatic.  Mouth/Throat: Oropharynx is   Moist still with white film on tongue   Missing upper teeth and multiple lower teeth.  Eyes: Conjunctivae and EOM are normal. Pupils are equal, round, and reactive to light.  Neck: Normal range of motion. Neck supple.  Cardiovascular: Normal rate and regular rhythm. Exam reveals no friction rub.  Respiratory: Effort normal. No respiratory distress. She makes stridorous sounds when breathing GI: Soft. Bowel sounds are normal. She exhibits no distension. There is no tenderness.  PEG site clean and dry without drainage.  Musculoskeletal: She exhibits no edema or tenderness.  Neurological: She is alert.  Did not make attempts at phonation.  Perseverative behaviors noted but able to hold comb and use to brush hair.   Apraxia noted. occasionally with an appropriate y/n nod. Poor oral-motor control Inconsistently follows  commands with perseveration . Right heel cord tight.  Lacks insight and awareness of deficits.  Unable to perform motor and sensory testing, Grossly 4/5 in LUE and LLE.No active movement right upper or right lower extremity Withdraws to pinch in the left upper and left lower but not in the right upper and right lower DTRs 3+ on right side    Skin: Skin is warm and dry. No rash noted. She is not diaphoretic. No erythema.  Psychiatric: She is not slowed and not withdrawn. Cognition and memory are impaired. She is noncommunicative. She is inattentive to right.     Assessment/Plan: 1. 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 Physiatrist is providing close team supervision and 24 hour management of active medical problems listed below. Physiatrist and rehab team continue to assess barriers to discharge/monitor patient progress toward functional and medical goals.  Function:  Bathing Bathing position   Position: Sitting EOB  Bathing parts Body parts bathed by patient: Chest, Abdomen, Right arm, Left upper leg, Right upper leg Body parts bathed by helper: Left arm, Back  Bathing assist Assist Level:  (Max assist for bathing)      Upper Body Dressing/Undressing Upper body dressing   What is the patient wearing?: Pull over shirt/dress     Pull over shirt/dress - Perfomed by patient: Thread/unthread left sleeve, Put head through opening Pull over shirt/dress - Perfomed by helper: Thread/unthread right sleeve, Pull shirt over trunk  Upper body assist Assist Level:  (Mod assist)      Lower Body Dressing/Undressing Lower body dressing   What is the patient wearing?: Pants, Socks, Shoes       Pants- Performed by helper: Thread/unthread right pants leg, Thread/unthread left pants leg, Pull pants up/down   Non-skid slipper socks- Performed by helper:  Don/doff right sock, Don/doff left sock   Socks - Performed by helper: Don/doff right sock, Don/doff left sock   Shoes - Performed by helper: Don/doff right shoe, Don/doff left shoe, Fasten right, Fasten left          Lower body assist Assist for lower body dressing: 2 Automotive engineer activity did not occur: No continent bowel/bladder event        Toileting assist     Transfers Chair/bed transfer   Chair/bed transfer method: Squat pivot Chair/bed transfer assist level: Maximal assist (Pt 25 - 49%/lift and lower) Chair/bed transfer assistive device: Armrests     Locomotion Ambulation Ambulation activity did not occur: Safety/medical concerns   Max distance: 16 Assist level: 2 helpers   Oceanographer activity did not occur: Safety/medical concerns (pt in tilt-in-space w/c, unable to propel) Type: Manual Max wheelchair distance: 25 Assist Level: Maximal assistance (Pt 25 - 49%)  Cognition Comprehension Comprehension assist level: Understands basic 50 - 74% of the time/ requires cueing 25 - 49% of the time  Expression Expression assist level: Expresses basic 25 - 49% of the time/requires cueing 50 - 75% of the time. Uses single words/gestures.  Social Interaction Social Interaction assist level: Interacts appropriately 50 - 74% of the time - May be physically or verbally inappropriate.  Problem Solving Problem solving assist level: Solves basic 25 - 49% of the time - needs direction more than half the time to initiate, plan or complete simple activities  Memory Memory assist level: Recognizes or recalls less than 25% of the time/requires cueing greater than 75% of the time   Medical Problem List and Plan: 1. Functional deficits secondary to Left MCA infarct, Right hemiplegia, apraxia, aphasia  -continue CIR therapies 2. DVT Prophylaxis/Anticoagulation: Pharmaceutical: Lovenox 3. Chronic DDD/Fibromyalgia/Pain Management: Used oxycodone prn  at home per family. Continue tylenol prn for now  -appears comfortable at present 4. Mood: LCSW to follow for evaluation and support as mentation improves.  5. Neuropsych: This patient is not capable of making decisions on her own behalf. 6. Skin/Wound Care: Routine pressure relief measures  -ocean nasal spray for dry nasal mucosa 7. Fluids/Electrolytes/Nutrition:eating quite well with supervision, By mouth intake is 0-100% of meals, fluid intake poor at around 300 mL per day  -continue H20 flushes through PEG.Will increase volume, monitor for fluid overload 8. HTN: Monitor BP bid. On Lisinopril, hydralazine and catapres.No reduce amlodipine to 5 mg, reduce Catapres to 0.1 mg, BP 120/70 this am 9. DM type 2: Family reports non-compliance with insulin at home. Will monitor BS ac/hs. Continue lantus bid with glipizide BID. Use SSI for elevated BS and titrate lantus as indicated.   -good control at present.cbg's reviewed CBG, well controlled at present, 89 this am, monitor hypoglycemia 10. Anxiety disorder: Used Paxil and ativan at home. Monitor as awareness improves.   -resumed low dose paxil--follow up with family about home dose 11. Acute pre-renal azotemia: increased water flushes to qid. BUN improved to 23.  12. Hx VDRF: continue occlusive dressing to trach site--area closing in nicely  --continues to be comfortable and managing airway well at  present  -high aspiration risk, no current evidence of asp PNA  -full supervision with meals, D1 honeyEncourage intake 13. Thrush: 2 days of diflucan 14. Constipation no BM since 1127 when she had a loose incontinent stool. We'll need to adjust laxatives 15. Urinary retention which is intermittent. Patient will need to try voiding on a toilet with assistance  LOS (Days) 7 A FACE TO [place] E 05/23/2015 10:02 AM

## 2015-05-24 ENCOUNTER — Inpatient Hospital Stay (HOSPITAL_COMMUNITY): Payer: 59

## 2015-05-24 ENCOUNTER — Inpatient Hospital Stay (HOSPITAL_COMMUNITY): Payer: 59 | Admitting: Speech Pathology

## 2015-05-24 ENCOUNTER — Inpatient Hospital Stay (HOSPITAL_COMMUNITY): Payer: 59 | Admitting: Physical Therapy

## 2015-05-24 LAB — GLUCOSE, CAPILLARY
GLUCOSE-CAPILLARY: 98 mg/dL (ref 65–99)
GLUCOSE-CAPILLARY: 79 mg/dL (ref 65–99)
GLUCOSE-CAPILLARY: 101 mg/dL — AB (ref 65–99)
Glucose-Capillary: 87 mg/dL (ref 65–99)

## 2015-05-24 MED ORDER — ACETAMINOPHEN 160 MG/5ML PO SOLN
325.0000 mg | ORAL | Status: DC | PRN
  Administered 2015-05-24 – 2015-05-30 (×3): 650 mg via ORAL
  Filled 2015-05-24 (×3): qty 20.3

## 2015-05-24 NOTE — Progress Notes (Signed)
Physical Therapy Session Note  Patient Details  Name: Kerry [name] MRN: [id number] Date of Birth: [birth year]  Today's Date: 05/24/2015 PT Individual Time: 1105-1200 PT Individual Time Calculation (min): 55 min   Short Term Goals: Week 1:  PT Short Term Goal 1 (Week 1): Pt will perform supine <>sit consistent minA with bedrails and HOB elevated as needed PT Short Term Goal 2 (Week 1): Pt will perform squat pivot transfer w/c <>bed with maxA of one person PT Short Term Goal 3 (Week 1): Pt will perform sit <>stand with modA +2 for safety and R extremities management PT Short Term Goal 4 (Week 1): Pt will initiate gait training x10' with LRAD and +2 A  Skilled Therapeutic Interventions/Progress Updates:    Session focus on RUE/RLE NMR, gait training, activity tolerance, and sitting balance.  PT propelled w/c to/from therapy gym for energy conservation and time management.  PT instructed patient in gait with LUE support on rail in hallway 2x30' with max A +1 for RLE advance and placement, weight shift, and R knee control.  +2 for w/c follow.  PT instructed patient in RLE NMR activity kicking ball with facilitation at R quad for contraction with pt demonstrating successful activation of quad 60% of the time.  PT instructed patient in PROM D1/D2 for RLE for NMR and stretching, and 2x20 seconds stretch for R glutes, hip flexors, and hamstrings.  PT instructed patient in trunk rotation to R and L in hook lying with facilitation at R hip abd/adductors for muscle activation to return to starting position.  Pt transitioned from supine>sitting on mat with mod assist to elevate trunk and squat/pivot to w/c with max assist.  Pt positioned in room at end of session with call bell in reach and needs met.   Gait with rail x2 passes max A + 2 assist RLE NMR kicking ball with hip positioned in flexion and faciliation at quad   Therapy Documentation Precautions:  Precautions Precautions: Fall Precaution  Comments: Peg, Rt shoulder subluxation Restrictions Weight Bearing Restrictions: No Pain: Pain Assessment Pain Assessment: Faces Pain Score: 3  Faces Pain Scale: No hurt   See Function Navigator for Current Functional Status.   Therapy/Group: Individual Therapy  Mariadelcarmen Corella E Penven-Crew 05/24/2015, 11:34 AM

## 2015-05-24 NOTE — Progress Notes (Signed)
[place] PHYSICAL MEDICINE & REHABILITATION     PROGRESS NOTE    Subjective/Complaints: Remains severely aphasic  Unable to perform ROS due to aphasia  Objective: Vital Signs: Blood pressure 123/68, pulse 76, temperature 98.1 F (36.7 C), temperature source Oral, resp. rate 18, weight 82.9 kg (182 lb 12.2 oz), SpO2 98 %. No results found. No results for input(s): WBC, HGB, HCT, PLT in the last 72 hours. No results for input(s): NA, K, CL, GLUCOSE, BUN, CREATININE, CALCIUM in the last 72 hours.  Invalid input(s): CO CBG (last 3)   Recent Labs  05/23/15 1715 05/23/15 2058 05/24/15 0636  GLUCAP 113* 103* 98    Wt Readings from Last 3 Encounters:  05/23/15 82.9 kg (182 lb 12.2 oz)  05/16/15 86.183 kg (190 lb)  04/27/15 92.5 kg (203 lb 14.8 oz)    Physical Exam:  Constitutional: She appears well-developed and well-nourished. No distress.  Sitting up in bed and comfortable  HENT: Trach site is clean no evidence of a fairly Head: Normocephalic and atraumatic.  Mouth/Throat: Oropharynx is   Moist still with white film on tongue   Missing upper teeth and multiple lower teeth.  Eyes: Conjunctivae and EOM are normal. Pupils are equal, round, and reactive to light.  Neck: Normal range of motion. Neck supple.  Cardiovascular: Normal rate and regular rhythm. Exam reveals no friction rub.  Respiratory: Effort normal. No respiratory distress. She makes stridorous sounds when breathing GI: Soft. Bowel sounds are normal. She exhibits no distension. There is no tenderness.  PEG site clean and dry without drainage.  Musculoskeletal: She exhibits no edema or tenderness.  Neurological: She is alert.  Did not make attempts at phonation.  Perseverative behaviors noted but able to hold comb and use to brush hair.   Apraxia noted. occasionally with an appropriate y/n nod. Poor oral-motor control Inconsistently follows commands with perseveration  Lacks insight and awareness  of deficits.  Unable to perform motor and sensory testing, Grossly 4/5 in LUE and LLE.No active movement right upper or right lower extremity Withdraws to pinch in the left upper and left lower but not in the right upper and right lower DTRs 3+ on right side    Skin: Skin is warm and dry. No rash noted. She is not diaphoretic. No erythema.  Psychiatric: She is not slowed and not withdrawn. Cognition and memory are impaired. She is noncommunicative. She is inattentive to right.     Assessment/Plan: 1. 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 Physiatrist is providing close team supervision and 24 hour management of active medical problems listed below. Physiatrist and rehab team continue to assess barriers to discharge/monitor patient progress toward functional and medical goals.  Function:  Bathing Bathing position   Position: Shower  Bathing parts Body parts bathed by patient: Chest, Abdomen, Right arm, Left upper leg, Right upper leg Body parts bathed by helper: Left arm, Back, Front perineal area, Buttocks, Right lower leg, Left lower leg  Bathing assist Assist Level: 2 helpers      Upper Body Dressing/Undressing Upper body dressing   What is the patient wearing?: Pull over shirt/dress     Pull over shirt/dress - Perfomed by patient: Thread/unthread left sleeve, Put head through opening Pull over shirt/dress - Perfomed by helper: Thread/unthread right sleeve, Pull shirt over trunk        Upper body assist Assist Level:  (Mod assist)  Lower Body Dressing/Undressing Lower body dressing   What is the patient wearing?: Pants, Socks, Shoes       Pants- Performed by helper: Thread/unthread right pants leg, Thread/unthread left pants leg, Pull pants up/down   Non-skid slipper socks- Performed by helper: Don/doff right sock, Don/doff left sock    Socks - Performed by helper: Don/doff right sock, Don/doff left sock   Shoes - Performed by helper: Don/doff right shoe, Don/doff left shoe, Fasten right, Fasten left          Lower body assist Assist for lower body dressing: 2 Automotive engineer activity did not occur: No continent bowel/bladder event        Toileting assist     Transfers Chair/bed transfer   Chair/bed transfer method: Squat pivot Chair/bed transfer assist level: Maximal assist (Pt 25 - 49%/lift and lower) Chair/bed transfer assistive device: Armrests     Locomotion Ambulation Ambulation activity did not occur: Safety/medical concerns   Max distance: 30 Assist level: 2 helpers   Oceanographer activity did not occur: Safety/medical concerns (pt in tilt-in-space w/c, unable to propel) Type: Manual Max wheelchair distance: 100 Assist Level: Moderate assistance (Pt 50 - 74%)  Cognition Comprehension Comprehension assist level: Understands basic 50 - 74% of the time/ requires cueing 25 - 49% of the time  Expression Expression assist level: Expresses basic 25 - 49% of the time/requires cueing 50 - 75% of the time. Uses single words/gestures.  Social Interaction Social Interaction assist level: Interacts appropriately 50 - 74% of the time - May be physically or verbally inappropriate.  Problem Solving Problem solving assist level: Solves basic 25 - 49% of the time - needs direction more than half the time to initiate, plan or complete simple activities  Memory Memory assist level: Recognizes or recalls less than 25% of the time/requires cueing greater than 75% of the time   Medical Problem List and Plan: 1. Functional deficits secondary to Left MCA infarct, Right hemiplegia, apraxia, aphasia  -continue CIR therapies 2. DVT Prophylaxis/Anticoagulation: Pharmaceutical: Lovenox 3. Chronic DDD/Fibromyalgia/Pain Management: Used oxycodone prn at home per family. Continue tylenol  prn for now  -appears comfortable at present 4. Mood: LCSW to follow for evaluation and support as mentation improves.  5. Neuropsych: This patient is not capable of making decisions on her own behalf. 6. Skin/Wound Care: Routine pressure relief measures  -ocean nasal spray for dry nasal mucosa 7. Fluids/Electrolytes/Nutrition:eating quite well with supervision, By mouth intake remains variable, fluid intake poor at around 300 mL per day  -continue H20 flushes through PEG.Will increase volume, monitor for fluid overload 8. HTN: Monitor BP bid. On Lisinopril, hydralazine and catapres.No reduce amlodipine to 5 mg, reduce Catapres to 0.1 mg, BP 120/70 this am 9. DM type 2: Family reports non-compliance with insulin at home. Will monitor BS ac/hs. Continue lantus bid with glipizide BID. Use SSI for elevated BS and titrate lantus as indicated.   -good control at present.cbg's reviewed CBG, well controlled at present,98 this am, monitor hypoglycemia 10. Anxiety disorder: Used Paxil and ativan at home. Monitor as awareness improves.   -resumed low dose paxil--follow up with family about home dose 11. Acute pre-renal azotemia: increased water flushes to qid. BUN improved to 23.  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 Thrush: 2 days of diflucan 14. Constipation no BM since 11/27  15. Urinary retention which is intermittent. Patient will need to try voiding on a toilet with assistance  LOS (Days) [place] E 05/24/2015 8:26 AM

## 2015-05-24 NOTE — Progress Notes (Signed)
Occupational Therapy Session Note  Patient Details  Name: Kerry [name] MRN: [id number] Date of Birth: Nov 13, 1955  Today's Date: 05/24/2015 OT Individual Time: 0900-1000 OT Individual Time Calculation (min): 60 min    Short Term Goals: Week 1:  OT Short Term Goal 1 (Week 1): Pt will maintain static sitting balance at EOB in preparation for self-care tasks OT Short Term Goal 2 (Week 1): Pt will complete bathing with mod assist and min cues for sequencing OT Short Term Goal 3 (Week 1): Pt will complete UB dressing wiht mod assist and min cues for sequencing OT Short Term Goal 4 (Week 1): Pt will complete toilet transfer to drop arm BSC with mod assist of 1 caregiver OT Short Term Goal 5 (Week 1): Pt will position RUE in preparation for transfers with min cues  Skilled Therapeutic Interventions/Progress Updates:    Pt engaged in BADL retraining including bathing and dressing with sit<>stand from w/c at sink.  Pt did not have a shirt and donned hospital gown and pants during dressing tasks.  Pt followed all one step commands during session.  Pt required min verbal cues for sequencing.  Pt required mod A for sit<>stand from w/c and min A for standing balance with min tactile cues for weight shifts.  Pt requires multiple rest breaks and min verbal cues when perseverating on bathing tasks.  Pt required assistance for positioning of RUE when not positioned in arm trough.  Focus on activity tolerance, sit<>stand, standing balance, task initiation, sequencing, following one step commands, attention to Rt, and safety awareness to increased independence with BADLs.  Therapy Documentation Precautions:  Precautions Precautions: Fall Precaution Comments: Peg, Rt shoulder subluxation Restrictions Weight Bearing Restrictions: No   Pain: Pain Assessment Pain Assessment: No/denies pain Pain Score: 0-No pain  See Function Navigator for Current Functional Status.   Therapy/Group: Individual  Therapy  Leroy Libman 05/24/2015, 11:04 AM

## 2015-05-24 NOTE — Progress Notes (Signed)
Pt kicked prevalon boots off this morning. Lynett Fish, RN

## 2015-05-24 NOTE — Progress Notes (Signed)
Speech Language Pathology Daily Session Note  Patient Details  Name: Kerry [name] MRN: [id number] Date of Birth: 1956-02-28  Today's Date: 05/24/2015 SLP Individual Time: 1430-1500 SLP Individual Time Calculation (min): 30 min  Short Term Goals: Week 1: SLP Short Term Goal 1 (Week 1): Patient will consume Dys.1 textures and pudding-thick liquids with Min multimodal cues for pacing and portion control to minimize overt s/s of aspiration.   SLP Short Term Goal 2 (Week 1): Patient will vocalize in 10% of opportunities with Max assit multimodal cues   SLP Short Term Goal 3 (Week 1): Patient will identify named object from a field of 3 with Max assist multimodal cues  SLP Short Term Goal 4 (Week 1): Patient will follow 1-step directions during familiar tasks with Max assist gesture cues  Skilled Therapeutic Interventions: Skilled treatment session focused on language goals. SLP facilitated session by providing total A for patient to follow 1 step commands in a structured task, without context, however, patient was able to follow 1 step commands with 100% accuracy in basic, familiar tasks with contextual cues. SLP also facilitated session by providing Max A verbal cues to identify functional items from a field of 3 with 50% accuracy. Patient was able to vocalize the appropriate melodies of familiar songs and required Max A multimodal cues for approximation of familiar phrases throughout singing task. Patient perseverative on "yes" and "dopa do" throughout session. Patient left supine in bed with alarm on and all needs within reach. Continue with current plan of care.      Function:  Cognition Comprehension Comprehension assist level: Understands basic 50 - 74% of the time/ requires cueing 25 - 49% of the time  Expression   Expression assist level: Expresses basic 25 - 49% of the time/requires cueing 50 - 75% of the time. Uses single words/gestures.  Social Interaction Social Interaction  assist level: Interacts appropriately 50 - 74% of the time - May be physically or verbally inappropriate.  Problem Solving Problem solving assist level: Solves basic 25 - 49% of the time - needs direction more than half the time to initiate, plan or complete simple activities  Memory Memory assist level: Recognizes or recalls less than 25% of the time/requires cueing greater than 75% of the time    Pain Pain Assessment Pain Assessment: No signs of pain  Therapy/Group: Individual Therapy  Kaitrin Seybold 05/24/2015, 5:54 PM

## 2015-05-24 NOTE — Progress Notes (Addendum)
Speech Language Pathology Daily Session Note  Patient Details  Name: Kerry [name] MRN: [id number] Date of Birth: 1956/03/31  Today's Date: 05/24/2015 SLP Individual Time: 0803-0900 SLP Individual Time Calculation (min): 57 min  Short Term Goals: Week 1: SLP Short Term Goal 1 (Week 1): Patient will consume Dys.1 textures and pudding-thick liquids with Min multimodal cues for pacing and portion control to minimize overt s/s of aspiration.   SLP Short Term Goal 2 (Week 1): Patient will vocalize in 10% of opportunities with Max assit multimodal cues   SLP Short Term Goal 3 (Week 1): Patient will identify named object from a field of 3 with Max assist multimodal cues  SLP Short Term Goal 4 (Week 1): Patient will follow 1-step directions during familiar tasks with Max assist gesture cues  Skilled Therapeutic Interventions:  Pt was seen for skilled ST targeting goals for communication and dysphagia.  Pt was transferred from bed to wheelchair with +2 assist from RN to maximize alertness and swallowing safety during structured therapeutic tasks.  SLP facilitated the session with trials of honey thick liquids via cup sips during breakfast to continue working towards liquids progression.  Pt demonstrated immediate cough x1 following bolus of pureed eggs which SLP suspects to be related to larger bolus size.  No overt s/s of aspiration were evident with cup sips of honey thick liquids; however, pt was noted with delayed wet vocal quality which she cleared with self initiated, reflexive throat clear.  Recommend advancement back to honey thick liquids at this time with continued pureed solids and full supervision for use of swallowing precautions.  Pt required intermittent min assist demonstration cues to clear anterior loss of boluses but otherwise demonstrated effective manipulation and clearance of materials from the oral cavity post swallow.  Pt demonstrated increased difficulty producing familiar  phrases in unison with SLP in comparison to previous therapy sessions, requiring at times up to total assist to produce "I love you."  Pt was able to recite 2 familiar songs in their entirety for ~75% accuracy with mod assist verbal and visual cues for awareness of verbal errors.  Pt was left upright in wheelchair with quick release belt donned, call bell left within reach.  Pt was able to return demonstration of use of call bell with Max assist multimodal cues.  Continue per current plan of care.      Function:  Eating Eating   Modified Consistency Diet: Yes Eating Assist Level: Supervision or verbal cues;Set up assist for   Eating Set Up Assist For: Opening containers       Cognition Comprehension Comprehension assist level: Understands basic 50 - 74% of the time/ requires cueing 25 - 49% of the time  Expression   Expression assist level: Expresses basic 25 - 49% of the time/requires cueing 50 - 75% of the time. Uses single words/gestures.  Social Interaction Social Interaction assist level: Interacts appropriately 50 - 74% of the time - May be physically or verbally inappropriate.  Problem Solving Problem solving assist level: Solves basic 25 - 49% of the time - needs direction more than half the time to initiate, plan or complete simple activities  Memory Memory assist level: Recognizes or recalls less than 25% of the time/requires cueing greater than 75% of the time    Pain Pain Assessment Pain Assessment: Faces Faces Pain Scale: No hurt  Therapy/Group: Individual Therapy  Shogo Larkey, Selinda Orion 05/24/2015, 12:11 PM

## 2015-05-25 ENCOUNTER — Inpatient Hospital Stay (HOSPITAL_COMMUNITY): Payer: 59 | Admitting: Physical Therapy

## 2015-05-25 ENCOUNTER — Inpatient Hospital Stay (HOSPITAL_COMMUNITY): Payer: 59 | Admitting: Occupational Therapy

## 2015-05-25 ENCOUNTER — Inpatient Hospital Stay (HOSPITAL_COMMUNITY): Payer: 59 | Admitting: Speech Pathology

## 2015-05-25 LAB — GLUCOSE, CAPILLARY
GLUCOSE-CAPILLARY: 86 mg/dL (ref 65–99)
Glucose-Capillary: 84 mg/dL (ref 65–99)
Glucose-Capillary: 122 mg/dL — ABNORMAL HIGH (ref 65–99)
Glucose-Capillary: 83 mg/dL (ref 65–99)

## 2015-05-25 NOTE — Progress Notes (Signed)
Physical Therapy Weekly Progress Note  Patient Details  Name: Kerry [name] MRN: [id number] Date of Birth: 24-Nov-1955  Beginning of progress report period: May 17, 2015 End of progress report period: May 25, 2015  Today's Date: 05/25/2015 PT Individual Time: 1300-1430 PT Individual Time Calculation (min): 90 min   Patient has met 4 of 4 short term goals.  Pt requires min/modA for bed mobility, maxA for all upright mobility, and +2A for gait with close w/c follow for safety and +2A for stairs. Limited by expressive aphasia, apraxia, and limited activity tolerance. Family has been present throughout at 67 of session; ongoing education regarding deficits and d/c planning.   Patient continues to demonstrate the following deficits: impaired activity tolerance, balance, postural control, ability to compensate for deficits, functional use of  right upper extremity and right lower extremity, attention, awareness and coordination and therefore will continue to benefit from skilled PT intervention to enhance overall performance with bed mobility, transfers, gait, stair training, access to home and community.   Patient progressing toward long term goals..  Continue plan of care.  PT Short Term Goals Week 1:  PT Short Term Goal 1 (Week 1): Pt will perform supine <>sit consistent minA with bedrails and HOB elevated as needed PT Short Term Goal 1 - Progress (Week 1): Met PT Short Term Goal 2 (Week 1): Pt will perform squat pivot transfer w/c <>bed with maxA of one person PT Short Term Goal 2 - Progress (Week 1): Met PT Short Term Goal 3 (Week 1): Pt will perform sit <>stand with modA +2 for safety and R extremities management PT Short Term Goal 3 - Progress (Week 1): Met PT Short Term Goal 4 (Week 1): Pt will initiate gait training x10' with LRAD and +2 A PT Short Term Goal 4 - Progress (Week 1): Met Week 2:  PT Short Term Goal 1 (Week 2): Pt will perform sit <>supine minA with HOB  flat to simulate home environment PT Short Term Goal 2 (Week 2): Pt will perform squat pivot transfer with consistent modA +1 PT Short Term Goal 3 (Week 2): Pt will perform gait with LRAD x30' modA +1 and w/c follow for safety  PT Short Term Goal 4 (Week 2): Pt will perform ascent/descent three 3-inch height stairs with maxA +1  Skilled Therapeutic Interventions/Progress Updates:    Pt received seated in w/c with no evidence of pain via FACES scale, agreeable to treatment. W/c propulsion with L hemi technique x150' including turns with minA initially to avoid obstacles on R side, decreased to S with visual/verbal cues for navigation and safety. Gait with L rail and maxA +1 for RLE progression and stance control, 30' x3 trials. W/c follow for safety and convenience. Seated and standing balance on edge of mat table with card matching at eye level and overhead to facilitate sit <>stand and standing balance. Matched cards correctly 85% of the time, with increased time to locate cards on R side due to mild R inattention. Seated in front of mirror, used dry erase markers to play tic-tac-toe, connecting numbers/letters in order, writing name and husbands name. Requires increased time for all activities and visual/verbal cues. Transfer w/c <>bed in simulated apartment x1 trial modA +1. Sit <>supine on bed x2 trials modA. Supine in hooklying with facilitation for bridging/scooting toward HOB. Attempted to hold ball between knees to encourage RLE AROM, however no AROM noted. Returned to room with [place] for energy conservation. Squat pivot to return to bed modA.  Sit >supine minA. Remained supine in bed with RN and NA present at completion of session. Mother arrived at end of session and therapist informed mother of pt's performance during session; mother hopeful that pt will continue to make good progress in next two weeks to prepare for d/c home.    Therapy Documentation Precautions:  Precautions Precautions:  Fall Precaution Comments: Peg, Rt shoulder subluxation Restrictions Weight Bearing Restrictions: No Pain: Pain Assessment Pain Assessment: Faces Faces Pain Scale: No hurt   See Function Navigator for Current Functional Status.  Therapy/Group: Individual Therapy  Luberta Mutter 05/25/2015, 2:39 PM

## 2015-05-25 NOTE — Progress Notes (Signed)
[facility]     PROGRESS NOTE    Subjective/Complaints: Remains severely aphasic  Unable to perform ROS due to aphasia  Objective: Vital Signs: Blood pressure 121/85, pulse 69, temperature 98 F (36.7 C), temperature source Oral, resp. rate 18, weight 82.9 kg (182 lb 12.2 oz), SpO2 98 %. No results found. No results for input(s): WBC, HGB, HCT, PLT in the last 72 hours. No results for input(s): NA, K, CL, GLUCOSE, BUN, CREATININE, CALCIUM in the last 72 hours.  Invalid input(s): CO CBG (last 3)   Recent Labs  05/24/15 1648 05/24/15 2034 05/25/15 0652  GLUCAP 87 101* 86    Wt Readings from Last 3 Encounters:  05/23/15 82.9 kg (182 lb 12.2 oz)  05/16/15 86.183 kg (190 lb)  04/27/15 92.5 kg (203 lb 14.8 oz)    Physical Exam:  Constitutional: She appears well-developed and well-nourished. No distress.  Sitting up in bed and comfortable  HENT: Trach site is clean no evidence of a fairly Head: Normocephalic and atraumatic.  Mouth/Throat: Oropharynx is   Moist still with white film on tongue   Missing upper teeth and multiple lower teeth.  Eyes: Conjunctivae and EOM are normal. Pupils are equal, round, and reactive to light.  Neck: Normal range of motion. Neck supple.  Cardiovascular: Normal rate and regular rhythm. Exam reveals no friction rub.  Respiratory: Effort normal. No respiratory distress. She makes stridorous sounds when breathing GI: Soft. Bowel sounds are normal. She exhibits no distension. There is no tenderness.  PEG site clean and dry without drainage.  Musculoskeletal: She exhibits no edema or tenderness.  Neurological: She is alert.  Did not make attempts at phonation.  Perseverative behaviors noted but able to hold comb and use to brush hair.   Apraxia noted. occasionally with an appropriate y/n nod. Poor oral-motor control Inconsistently follows commands with perseveration  Lacks insight and awareness of  deficits.  Unable to perform motor and sensory testing, Grossly 4/5 in LUE and LLE.No active movement right upper or right lower extremity Withdraws to pinch in the left upper and left lower but not in the right upper and right lower DTRs 3+ on right side    Skin: Skin is warm and dry. No rash noted. She is not diaphoretic. No erythema.  Psychiatric: She is not slowed and not withdrawn. Cognition and memory are impaired. She is noncommunicative. She is inattentive to right.     Assessment/Plan: 1. 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 Physiatrist is providing close team supervision and 24 hour management of active medical problems listed below. Physiatrist and rehab team continue to assess barriers to discharge/monitor patient progress toward functional and medical goals.  Function:  Bathing Bathing position   Position: Wheelchair/chair at sink  Bathing parts Body parts bathed by patient: Chest, Abdomen, Right arm, Right upper leg, Left upper leg, Front perineal area Body parts bathed by helper: Left arm, Buttocks, Right lower leg, Left lower leg, Back  Bathing assist Assist Level: 2 helpers      Upper Body Dressing/Undressing Upper body dressing   What is the patient wearing?: [place] over shirt/dress - Perfomed by patient: Thread/unthread left sleeve, Put head through opening Pull over shirt/dress - Perfomed by helper: Thread/unthread right sleeve, Pull shirt over trunk        Upper body assist Assist Level:  (Mod  assist)      Lower Body Dressing/Undressing Lower body dressing   What is the patient wearing?: Pants, Socks, Shoes       Pants- Performed by helper: Thread/unthread right pants leg, Thread/unthread left pants leg, Pull pants up/down   Non-skid slipper socks- Performed by helper: Don/doff right sock, Don/doff left  sock   Socks - Performed by helper: Don/doff right sock, Don/doff left sock   Shoes - Performed by helper: Don/doff right shoe, Don/doff left shoe, Fasten right, Fasten left          Lower body assist Assist for lower body dressing: 2 Automotive engineer activity did not occur: No continent bowel/bladder event        Toileting assist     Transfers Chair/bed transfer   Chair/bed transfer method: Squat pivot Chair/bed transfer assist level: Maximal assist (Pt 25 - 49%/lift and lower) Chair/bed transfer assistive device: Armrests     Locomotion Ambulation Ambulation activity did not occur: Safety/medical concerns   Max distance: 30 Assist level: 2 helpers   Oceanographer activity did not occur: Safety/medical concerns (pt in tilt-in-space w/c, unable to propel) Type: Manual Max wheelchair distance: 100 Assist Level: Moderate assistance (Pt 50 - 74%)  Cognition Comprehension Comprehension assist level: Understands basic 50 - 74% of the time/ requires cueing 25 - 49% of the time  Expression Expression assist level: Expresses basic 25 - 49% of the time/requires cueing 50 - 75% of the time. Uses single words/gestures.  Social Interaction Social Interaction assist level: Interacts appropriately 50 - 74% of the time - May be physically or verbally inappropriate.  Problem Solving Problem solving assist level: Solves basic 25 - 49% of the time - needs direction more than half the time to initiate, plan or complete simple activities  Memory Memory assist level: Recognizes or recalls less than 25% of the time/requires cueing greater than 75% of the time   Medical Problem List and Plan: 1. Functional deficits secondary to Left MCA infarct, Right hemiplegia, apraxia, aphasia  -continue CIR therapies 2. DVT Prophylaxis/Anticoagulation: Pharmaceutical: Lovenox 3. Chronic DDD/Fibromyalgia/Pain Management: Used oxycodone prn at home per family. Continue  tylenol prn for now  -appears comfortable at present 4. Mood: LCSW to follow for evaluation and support as mentation improves.  5. Neuropsych: This patient is not capable of making decisions on her own behalf. 6. Skin/Wound Care: Routine pressure relief measures  -ocean nasal spray for dry nasal mucosa 7. Fluids/Electrolytes/Nutrition:eating quite well with supervision, By mouth intake remains variable, fluid intake poor at around 300 mL per day, intake 20-40% of meals  -continue H20 flushes through PEG.Will increase volume, monitor for fluid overload 8. HTN: Monitor BP bid. On Lisinopril, hydralazine and catapres. reduce amlodipine to 5 mg, reduce Catapres to 0.1 mg, BP 121/85this am 9. DM type 2: Family reports non-compliance with insulin at home. Will monitor BS ac/hs. Continue lantus bid with glipizide BID. Use SSI for elevated BS and titrate lantus as indicated.   -good control at present.cbg's reviewed CBG, well controlled at present,98 this am, monitor hypoglycemia 10. Anxiety disorder: Used Paxil and ativan at home. Monitor as awareness improves.   -resumed low dose paxil--follow up with family about home dose 11. Acute pre-renal azotemia: increased water flushes to qid. BUN improved to 23.  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 Thrush: 2 days of diflucan 14. Constipation did have incontinent BM on 12 1 15. Urinary retention which is intermittent. Patient will need to try voiding on a toilet with assistance  LOS (Days) 9 A FACE TO [place] E 05/25/2015 8:45 AM

## 2015-05-25 NOTE — Progress Notes (Signed)
Occupational Therapy Weekly Progress Note  Patient Details  Name: Kerry [name] MRN: [id number] Date of Birth: [birth year]  Beginning of progress report period: May 18, 2015 End of progress report period: May 25, 2015  Today's Date: 05/25/2015 OT Individual Time: 6010-9323 OT Individual Time Calculation (min): 60 min    Patient has met 5 of 5 short term goals. Pt is making steady progress towards goals. Pt continues to require initial assist to achieve midline sitting balance on bed, but is able to correct lean with min cues and able to sustain midline balance during static sitting.  Pt is demonstrating increased initiation, sequencing, and problem solving with basic functional tasks in familiar context.  Pt continues to demonstrate fear and hesitancy with increased activity, as evidenced by increase in respiration rate with standing or increased challenge during self-care tasks.  Pt has progressed to a mod-max assist with functional transfers when allowed time to initiate.  Patient continues to demonstrate the following deficits: mild Rt inattention, functional use of dominant RUE with subluxation, impaired activity tolerance, postural control, and impaired attention and awareness of deficits and therefore will continue to benefit from skilled OT intervention to enhance overall performance with BADL and Reduce care partner burden.  Patient progressing toward long term goals..  Continue plan of care.  OT Short Term Goals Week 1:  OT Short Term Goal 1 (Week 1): Pt will maintain static sitting balance at EOB in preparation for self-care tasks OT Short Term Goal 1 - Progress (Week 1): Met OT Short Term Goal 2 (Week 1): Pt will complete bathing with mod assist and min cues for sequencing OT Short Term Goal 2 - Progress (Week 1): Met OT Short Term Goal 3 (Week 1): Pt will complete UB dressing wiht mod assist and min cues for sequencing OT Short Term Goal 3 - Progress (Week 1):  Met OT Short Term Goal 4 (Week 1): Pt will complete toilet transfer to drop arm BSC with mod assist of 1 caregiver OT Short Term Goal 4 - Progress (Week 1): Met OT Short Term Goal 5 (Week 1): Pt will position RUE in preparation for transfers with min cues OT Short Term Goal 5 - Progress (Week 1): Met Week 2:  OT Short Term Goal 1 (Week 2): Pt will complete bathing with min assist at sit > stand level OT Short Term Goal 2 (Week 2): Pt will complete LB dressing with mod assist at sit > stand level OT Short Term Goal 3 (Week 2): Pt will complete toilet transfer to regular commode with mod assist of 1 caregiver OT Short Term Goal 4 (Week 2): Pt will complete 1 grooming task in standing with min assist for standing balance  Skilled Therapeutic Interventions/Progress Updates:    ADL retraining with focus on static and dynamic sitting balance, sequencing, problem solving, and standing balance.  Completed bathing and dressing seated at EOB to continue to address trunk control, with pt demonstrating increased awareness when losing balance and ability to correct lean to Rt, requiring min cues throughout.  Pt with improved participation with LB dressing when therapist supporting leg across opposite knee to allow pt to attempt threading pant legs, socks, and shoes.  Mod cues and encouragement to attempt LB dressing.  Mod assist sit > stand for LB dressing, requiring assist to pull pants over Rt hip.  Squat pivot to w/c with mod assist with pt demonstrating improved initiation with positioning body and initiating transfer once set up, requiring mod assist to  help position hips appropriately in chair.  Engaged in sit > stand and reaching to Lt to promote weight shifting in standing while decorating Christmas tree as meaningful task.  +2 with all standing at tree with one person on Rt to support RUE and promote proper positioning of RLE and blocking knee while 2nd person assisted with weight shift to come into standing  and when reaching for ornaments.  Completed sit > stand x7 with mod-max assist.  Pt with increased respiration rate with increased challenge with standing to decorate tree and when pulling pants over hips, continues to benefit from encouragement.  Propelled w/c approx 100 feet with visual and verbal cues for attention to Rt and to correct when veering Rt.  Therapy Documentation Precautions:  Precautions Precautions: Fall Precaution Comments: Peg, Rt shoulder subluxation Restrictions Weight Bearing Restrictions: No General:   Vital Signs: Therapy Vitals BP: 124/82 mmHg Pain:  Pt with no c/o pain  See Function Navigator for Current Functional Status.   Therapy/Group: Individual Therapy  Simonne Come 05/25/2015, 11:36 AM

## 2015-05-25 NOTE — Progress Notes (Signed)
Speech Language Pathology Weekly Progress and Session Note  Patient Details  Name: Kerry [name] MRN: [id number] Date of Birth: July 16, 1955  Beginning of progress report period: May 18, 2015  End of progress report period: May 25, 2015  Today's Date: 05/25/2015 SLP Individual Time: 1105-1200 SLP Individual Time Calculation (min): 55 min  Short Term Goals: Week 1: SLP Short Term Goal 1 (Week 1): Patient will consume Dys.1 textures and pudding-thick liquids with Min multimodal cues for pacing and portion control to minimize overt s/s of aspiration.   SLP Short Term Goal 1 - Progress (Week 1): Met SLP Short Term Goal 2 (Week 1): Patient will vocalize in 10% of opportunities with Max assit multimodal cues   SLP Short Term Goal 2 - Progress (Week 1): Met SLP Short Term Goal 3 (Week 1): Patient will identify named object from a field of 3 with Max assist multimodal cues  SLP Short Term Goal 3 - Progress (Week 1): Met SLP Short Term Goal 4 (Week 1): Patient will follow 1-step directions during familiar tasks with Max assist gesture cues SLP Short Term Goal 4 - Progress (Week 1): Progressing toward goal    New Short Term Goals: Week 2: SLP Short Term Goal 1 (Week 2): Patient will consume Dys.1 textures and honey thick liquids with Min tactile cues for pacing and portion control to minimize overt s/s of aspiration over three consecutive sessions prior to initiation of nectar thick liquids trials.  SLP Short Term Goal 2 (Week 2): Patient will vocalize in 25% of opportunities with Max assit multimodal cues   SLP Short Term Goal 3 (Week 2): Patient will follow 1-step directions during familiar tasks with Max assist gesture cues SLP Short Term Goal 4 (Week 2): Pt will match object to phrase from a field of three with max assist visual cues.    Weekly Progress Updates:  Pt made functional gains this reporting period and has met 3 out of 4 short term goals.  Pt diet has been upgraded to  dys 1 textures and honey thick liquids which pt is consuming with minimal s/s of aspiration and full supervision for use of swallowing precautions.  She requires max assist for functional communication due to global aphasia and apraxia but has demonstrated some improvement in initiation of appropriate verbalization in the context of familiar songs.  Spontaneous verbal expression remains limited by perseveration although pt appears to have improved awareness of verbal errors with overall min-mod assist visual and gestural cues.  Pt would continue to benefit from skilled ST while inpatient in order to maximize functional independence and reduce burden of care prior to discharge.     Intensity: Minumum of 1-2 x/day, 30 to 90 minutes Frequency: 3 to 5 out of 7 days Duration/Length of Stay: 19-22 days  Treatment/Interventions: Cognitive remediation/compensation;Cueing hierarchy;Dysphagia/aspiration precaution training;Environmental controls;Functional tasks;Internal/external aids;Patient/family education;Multimodal communication approach;Speech/Language facilitation;Therapeutic Activities   Daily Session Skilled Therapeutic Interventions: Pt was seen for skilled ST targeting communication goals.  Pt was able to identify objects when named from a field of three with 60% accuracy, improving to 80% accuracy with min assist gestural and visual cues.  Pt required max demonstration and tactile cues, in addition to intermittent hand over hand assist to follow 1-step commands for ~80% accuracy.  SLP also facilitated the session with ongoing diagnostic assessment of reading comprehension.  Pt was able to match object to written word from a field of 3 for 60% accuracy independently, improving to 75% accuracy with min assist  visual cues, and further improving to 88% accuracy with max assist visual cues.   Of note, SLP briefly completed skilled observations during presentations of honey thick liquids via cup to assess  toleration following yesterday's diet upgrade.  Pt consumed thickened liquids without overt s/s of aspiration although she continues to present with suspected delayed swallow initiation and multiple swallows.  Recommend that pt continue on dys 1, honey thick liquids diet with full supervision for use of precautions.  Sister was present throughout the duration of today's therapy session and was provided with strategies to facilitate pt's comprehension of verbal expression. Pt was left upright in wheelchair with quick release belt donned with sister present.  Continue per current plan of care.        Function:   Eating Eating   Modified Consistency Diet: Yes Eating Assist Level: Supervision or verbal cues;Set up assist for   Eating Set Up Assist For: Opening containers       Cognition Comprehension Comprehension assist level: Understands basic 50 - 74% of the time/ requires cueing 25 - 49% of the time  Expression   Expression assist level: Expresses basic 25 - 49% of the time/requires cueing 50 - 75% of the time. Uses single words/gestures.  Social Interaction Social Interaction assist level: Interacts appropriately 50 - 74% of the time - May be physically or verbally inappropriate.  Problem Solving Problem solving assist level: Solves basic 25 - 49% of the time - needs direction more than half the time to initiate, plan or complete simple activities  Memory Memory assist level: Recognizes or recalls 25 - 49% of the time/requires cueing 50 - 75% of the time   General    Pain Pain Assessment Pain Assessment: Faces Faces Pain Scale: No hurt  Therapy/Group: Individual Therapy  Kerry [name], Kerry [name] 05/25/2015, 12:52 PM

## 2015-05-26 ENCOUNTER — Inpatient Hospital Stay (HOSPITAL_COMMUNITY): Payer: 59 | Admitting: *Deleted

## 2015-05-26 ENCOUNTER — Inpatient Hospital Stay (HOSPITAL_COMMUNITY): Payer: 59 | Admitting: Speech Pathology

## 2015-05-26 DIAGNOSIS — E1165 Type 2 diabetes mellitus with hyperglycemia: Secondary | ICD-10-CM

## 2015-05-26 DIAGNOSIS — E1140 Type 2 diabetes mellitus with diabetic neuropathy, unspecified: Secondary | ICD-10-CM

## 2015-05-26 LAB — GLUCOSE, CAPILLARY
GLUCOSE-CAPILLARY: 114 mg/dL — AB (ref 65–99)
GLUCOSE-CAPILLARY: 86 mg/dL (ref 65–99)
Glucose-Capillary: 67 mg/dL (ref 65–99)
Glucose-Capillary: 83 mg/dL (ref 65–99)
Glucose-Capillary: 82 mg/dL (ref 65–99)

## 2015-05-26 NOTE — Plan of Care (Signed)
Problem: RH SKIN INTEGRITY Goal: RH STG SKIN FREE OF INFECTION/BREAKDOWN Skin will remain free from infection/ breakdown while on rehab moderate assistance  Outcome: Not Progressing Patient refusing to turn q2hr or allow pressure relief consistently.  Explained importance of pressure relief for skin integrity.  Will continue to monitor.

## 2015-05-26 NOTE — Progress Notes (Signed)
Patient has blanchable redness to her sacrum.  Patient is refusing to be turned q2hr.  Patient was educated about the importance of turning due to the risk of skin breakdown.  Will continue to monitor.

## 2015-05-26 NOTE — Progress Notes (Signed)
Kerry [name] is a 59 y.o. female 02/12/1956 [id number]  Subjective: RN reports cbg 10 this AM - taking 50% meal intake - Pt denies symptoms (family at side concurs) No other problems noted  Objective: Vital signs in last 24 hours: Temp:  [98.2 F (36.8 C)] 98.2 F (36.8 C) (12/03 0624) Pulse Rate:  [68-84] 72 (12/03 0915) Resp:  [18] 18 (12/03 0624) BP: (118-143)/(58-80) 118/64 mmHg (12/03 0915) SpO2:  [98 %-99 %] 99 % (12/03 0624) Weight change:  Last BM Date: 05/25/15  Intake/Output from previous day: 12/02 0701 - 12/03 0700 In: 480 [P.O.:480] Out: 650 [Urine:650]  Physical Exam General: No apparent distress   Severe aphasia without change Lungs: Normal effort. Lungs clear to auscultation, no crackles or wheezes. Cardiovascular: Regular rate and rhythm, no edema Neurological: No new neurological deficits Wounds: N/A      Lab Results: BMET    Component Value Date/Time   NA 138 05/21/2015 0640   K 4.0 05/21/2015 0640   CL 102 05/21/2015 0640   CO2 27 05/21/2015 0640   GLUCOSE 100* 05/21/2015 0640   BUN 23* 05/21/2015 0640   CREATININE 0.63 05/21/2015 0640   CALCIUM 9.7 05/21/2015 0640   GFRNONAA >60 05/21/2015 0640   GFRAA >60 05/21/2015 0640   CBC    Component Value Date/Time   WBC 9.4 05/17/2015 0452   WBC 10.6* 12/17/2011 1528   RBC 4.16 05/17/2015 0452   RBC 5.02 12/17/2011 1528   HGB 11.6* 05/17/2015 0452   HGB 14.6 12/17/2011 1528   HCT 37.0 05/17/2015 0452   HCT 43.2 12/17/2011 1528   PLT 224 05/17/2015 0452   PLT 239 12/17/2011 1528   MCV 88.9 05/17/2015 0452   MCV 86.0 12/17/2011 1528   MCH 27.9 05/17/2015 0452   MCH 29.0 12/17/2011 1528   MCHC 31.4 05/17/2015 0452   MCHC 33.7 12/17/2011 1528   RDW 14.6 05/17/2015 0452   RDW 13.8 12/17/2011 1528   LYMPHSABS 3.8 05/17/2015 0452   LYMPHSABS 3.0 12/17/2011 1528   MONOABS 0.5 05/17/2015 0452   MONOABS 0.5 12/17/2011 1528   EOSABS 0.2 05/17/2015 0452   EOSABS 0.4 12/17/2011 1528   BASOSABS 0.0 05/17/2015 0452   BASOSABS 0.1 12/17/2011 1528   CBG's (last 3):    Recent Labs  05/25/15 2144 05/26/15 0722 05/26/15 0749  GLUCAP 83 67 114*   LFT's Lab Results  Component Value Date   ALT 31 05/17/2015   AST 27 05/17/2015   ALKPHOS 74 05/17/2015   BILITOT 0.4 05/17/2015    Studies/Results: No results found.  Medications:  I have reviewed the patient's current medications. Scheduled Medications: . amLODipine  5 mg Oral Daily  . antiseptic oral rinse  7 mL Mouth Rinse BID  . atorvastatin  20 mg Oral q1800  . cloNIDine  0.1 mg Oral BID  . clopidogrel  75 mg Oral Daily  . enoxaparin (LOVENOX) injection  40 mg Subcutaneous Q24H  . famotidine  20 mg Oral BID  . feeding supplement (PRO-STAT SUGAR FREE 64)  30 mL Oral BID  . free water  100 mL Per Tube TID WC & HS  . glipiZIDE  5 mg Oral QAC breakfast  . hydrALAZINE  20 mg Oral 3 times per day  . hydrocerin   Topical BID  . insulin aspart  0-5 Units Subcutaneous QHS  . insulin aspart  0-9 Units Subcutaneous TID WC  . insulin glargine  24 Units Subcutaneous BID  . lisinopril  10 mg  Oral BID  . multivitamin with minerals  1 tablet Oral Daily  . nicotine  7 mg Transdermal Daily  . PARoxetine  10 mg Per Tube Daily  . sodium chloride  1 spray Each Nare TID  . thiamine  100 mg Oral Daily  . traMADol  25 mg Oral 4 times per day  . 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 Functional deficits secondary to Left MCA infarct, Right hemiplegia, apraxia, aphasia -continue CIR therapies 2. DVT Prophylaxis/Anticoagulation: Pharmaceutical: Lovenox 3. Chronic DDD/Fibromyalgia/Pain Management: Used oxycodone prn at home per family. Continue tylenol prn for now -appears comfortable at present 4. Mood: LCSW to follow for evaluation and support as mentation improves.  5. Neuropsych: This patient is not capable of making decisions on her own behalf. 6. Skin/Wound Care: Routine pressure relief measures -ocean nasal spray for dry nasal mucosa 7. Fluids/Electrolytes/Nutrition: eating reasonably well with supervision; without help, oral intake remains variable, fluid intake poor at around 300 mL per day, intake 20-40% of meals -continue H20 flushes through PEG. monitor for fluid overload 8. HTN: Monitor BP bid. On Lisinopril, hydralazine and catapres. reduced amlodipine to 5 mg, reduced Catapres to 0.1 mg 9. DM type 2: mild asymptomatic hypoglycemia this AM reviewed. Family confirms non-compliance with insulin at home. Will monitor BS ac/hs. Continue lantus bid with glipizide BID, no dose change for now - encourage po intake. Use SSI for elevated BS and titrate lantus as indicated.  - continue to monitor for hypoglycemia 10. Anxiety disorder: Used Paxil and ativan at home. Monitor as awareness improves.  -resumed low dose paxil--follow up with family about home dose 11. Acute pre-renal azotemia: increased water flushes to qid. BUN improved to 23.  12. Hx VDRF: continue occlusive dressing to trach site--area  closing in nicely --continues to be comfortable and managing airway well at present -high aspiration risk, no current evidence of asp PNA -full supervision with meals, D1 honeyEncourage intake 13. Thrush: s/p 2 days of diflucan 14. Constipation - improved; incontinent BM on 12/1 15. Urinary retention which is intermittent. Patient will need to try voiding on a toilet with assistance  Length of stay, days: 10   Valerie A. Asa Lente, MD 05/26/2015, 11:25 AM

## 2015-05-26 NOTE — Progress Notes (Signed)
Speech Language Pathology Daily Session Note  Patient Details  Name: Kerry [name] MRN: [id number] Date of Birth: July 28, 1955  Today's Date: 05/26/2015 SLP Individual Time: 1300-1345 SLP Individual Time Calculation (min): 45 min  Short Term Goals: Week 2: SLP Short Term Goal 1 (Week 2): Patient will consume Dys.1 textures and honey thick liquids with Min tactile cues for pacing and portion control to minimize overt s/s of aspiration over three consecutive sessions prior to initiation of nectar thick liquids trials.  SLP Short Term Goal 2 (Week 2): Patient will vocalize in 25% of opportunities with Max assit multimodal cues   SLP Short Term Goal 3 (Week 2): Patient will follow 1-step directions during familiar tasks with Max assist gesture cues SLP Short Term Goal 4 (Week 2): Pt will match object to phrase from a field of three with max assist visual cues.    Skilled Therapeutic Interventions: Skilled St intervention provided with focus on dysphagia and cognitive goals. Pt was seen in room for therapy session, while in bed. Clinician assist pt with snack of HTL consistency. Pt consumed 100% of HTL juice by spoon with no overt s/s of aspiration. Min verbal cues required to increase swallow safety and use of strategies. Pt matched object to phrase with 20% accuracy, given mod cues.     Function:  Eating Eating   Modified Consistency Diet: Yes Eating Assist Level: Supervision or verbal cues;Set up assist for;Helper checks for pocketed food   Eating Set Up Assist For: Opening containers       Cognition Comprehension Comprehension assist level: Understands basic 50 - 74% of the time/ requires cueing 25 - 49% of the time  Expression   Expression assist level: Expresses basis less than 25% of the time/requires cueing >75% of the time.  Social Interaction Social Interaction assist level: Interacts appropriately 50 - 74% of the time - May be physically or verbally inappropriate.  Problem  Solving Problem solving assist level: Solves basic 25 - 49% of the time - needs direction more than half the time to initiate, plan or complete simple activities  Memory Memory assist level: Recognizes or recalls 25 - 49% of the time/requires cueing 50 - 75% of the time    Pain Pain Assessment Pain Assessment: No/denies pain  Therapy/Group: Individual Therapy  Nare Gaspari, Bernerd Pho 05/26/2015, 3:02 PM

## 2015-05-26 NOTE — Progress Notes (Signed)
Physical Therapy Session Note  Patient Details  Name: Kerry [name] MRN: [id number] Date of Birth: 1955/08/08  Today's Date: 05/26/2015 PT Individual Time: 1430-1530 PT Individual Time Calculation (min): 60 min    Skilled Therapeutic Interventions/Progress Updates:  Patient in room , in bed, signals that she needs to be changed, RN assisted in hygiene and changing clothes as patient is totally dependant for changing, max A for rolling in bed to R and L. Coming to sit EOB with max A, sitting with minA. Transfer to w/c with max A via stand pivot.  W/C propulsion training with min A to assist with path following, patient was able to get from room to gym and back with hemi technique.  Sit to stand and standing at rail in a hall 4 x 2 min max standing with min A and manual assistance for weight distribution, patient seats uncontrolled w/o warning, close w/c follow and placement is critical.  Attempted to train short distance gait with side rail on L and therapist support on R with max A for R LE progression and manual locking of knee during weight bearing . Patient returned to room , left in w/c with QR belt on. Nursing notified patient is up in a w/c.   Therapy Documentation Precautions:  Precautions Precautions: Fall Precaution Comments: Peg, Rt shoulder subluxation Restrictions Weight Bearing Restrictions: No Vital Signs: Therapy Vitals Temp: 98.7 F (37.1 C) Temp Source: Oral Pulse Rate: 83 Resp: 18 BP: 134/64 mmHg Patient Position (if appropriate): Lying Oxygen Therapy SpO2: 99 % O2 Device: Not Delivered Pain: Pain Assessment Pain Assessment: No/denies pain  See Function Navigator for Current Functional Status.   Therapy/Group: Individual Therapy  Guad[place]pe Spanish 05/26/2015, 3:49 PM

## 2015-05-26 NOTE — Progress Notes (Signed)
Hypoglycemic Event  CBG: 67  Treatment: 15 GM carbohydrate snack  Symptoms: None  Follow-up CBG: [id number] CBG Result:114  Possible Reasons for Event: Unknown  Comments/MD notified:Dr. Asa Lente notified; advises to give lantus as ordered.  Will continue to monitor.    Tina Griffiths

## 2015-05-27 ENCOUNTER — Inpatient Hospital Stay (HOSPITAL_COMMUNITY): Payer: 59 | Admitting: Physical Therapy

## 2015-05-27 DIAGNOSIS — E118 Type 2 diabetes mellitus with unspecified complications: Secondary | ICD-10-CM

## 2015-05-27 DIAGNOSIS — Z794 Long term (current) use of insulin: Secondary | ICD-10-CM

## 2015-05-27 LAB — GLUCOSE, CAPILLARY
GLUCOSE-CAPILLARY: 84 mg/dL (ref 65–99)
GLUCOSE-CAPILLARY: 100 mg/dL — AB (ref 65–99)
GLUCOSE-CAPILLARY: 109 mg/dL — AB (ref 65–99)
Glucose-Capillary: 81 mg/dL (ref 65–99)

## 2015-05-27 NOTE — Progress Notes (Signed)
1930  Patient with nose bleed with bright red blood. Moderate amount from left nostril. 2134 BP 131/72 P 70. adm

## 2015-05-27 NOTE — Progress Notes (Signed)
Physical Therapy Session Note  Patient Details  Name: Kerry [name] MRN: [id number] Date of Birth: 08-16-1955  Today's Date: 05/27/2015 PT Individual Time: 1015-1115 PT Individual Time Calculation (min): 60 min   Short Term Goals: Week 1:  PT Short Term Goal 1 (Week 1): Pt will perform supine <>sit consistent minA with bedrails and HOB elevated as needed PT Short Term Goal 1 - Progress (Week 1): Met PT Short Term Goal 2 (Week 1): Pt will perform squat pivot transfer w/c <>bed with maxA of one person PT Short Term Goal 2 - Progress (Week 1): Met PT Short Term Goal 3 (Week 1): Pt will perform sit <>stand with modA +2 for safety and R extremities management PT Short Term Goal 3 - Progress (Week 1): Met PT Short Term Goal 4 (Week 1): Pt will initiate gait training x10' with LRAD and +2 A PT Short Term Goal 4 - Progress (Week 1): Met Week 2:  PT Short Term Goal 1 (Week 2): Pt will perform sit <>supine minA with HOB flat to simulate home environment PT Short Term Goal 2 (Week 2): Pt will perform squat pivot transfer with consistent modA +1 PT Short Term Goal 3 (Week 2): Pt will perform gait with LRAD x30' modA +1 and w/c follow for safety  PT Short Term Goal 4 (Week 2): Pt will perform ascent/descent three 3-inch height stairs with maxA +1  Skilled Therapeutic Interventions/Progress Updates:   Pt able to perform segmental transfers with decreased assist at times, but unable to consistently perform without total manua cueing. Pt also very limited by flexed and L rotated posture also impacting core stability. Pt would continue to benefit from skilled PT intervention to increase functional mobility.  Therapy Documentation Precautions:  Precautions Precautions: Fall Precaution Comments: Peg, Rt shoulder subluxation Restrictions Weight Bearing Restrictions: No Pain: Pain Assessment Pain Assessment: No/denies pain Mobility:  Mod A transfers with cues for LE placement, weight shift, and  technique Locomotion :   2' Max +2 assist without AD Other Treatments:  Pt and family educated on rehab plan, safety in mobility, forced use, role of posture, R sided attention, and progressing in mobility. Pt performs static standing 1'x5 in session. Pt performs pre-gait LLE advancement then RLE advancement 4x10. Pt performs standing weight shifts 3x10. Pt performs static standing reaching on w/c handle then SBQC 2x10. Pt performs segmental transfers to and from w/cduring 3 trials. T/S and L/S soft tissue stretches into ext 3x30". Partial sit to stands 3x5 including 5 trials with [facility] and 5 trials with W/c handle.   See Function Navigator for Current Functional Status.   Therapy/Group: Individual Therapy  [name] 05/27/2015, 1:01 PM

## 2015-05-27 NOTE — Progress Notes (Signed)
Kerry [name] is a 59 y.o. female 14-Jan-1956 [id number]  Subjective: Severe aphasia without change - no family at side. Pt appears comfortable and agreeable - no grimace or distress during conversation. RN reports decent intake  Objective: Vital signs in last 24 hours: Temp:  [98.2 F (36.8 C)-98.7 F (37.1 C)] 98.2 F (36.8 C) (12/04 0523) Pulse Rate:  [67-83] 67 (12/04 0523) Resp:  [18-19] 19 (12/04 0523) BP: (125-156)/(60-72) 125/60 mmHg (12/04 0523) SpO2:  [98 %-99 %] 98 % (12/04 0523) Weight change:  Last BM Date: 05/26/15  Intake/Output from previous day: 12/03 0701 - 12/04 0700 In: 290 [P.O.:290] Out: 650 [Urine:650]  Physical Exam General: No apparent distress   Severe aphasia without change Lungs: Normal effort. Lungs clear to auscultation, no crackles or wheezes. Cardiovascular: Regular rate and rhythm, no edema Neurological: No new neurological deficits  Lab Results: BMET    Component Value Date/Time   NA 138 05/21/2015 0640   K 4.0 05/21/2015 0640   CL 102 05/21/2015 0640   CO2 27 05/21/2015 0640   GLUCOSE 100* 05/21/2015 0640   BUN 23* 05/21/2015 0640   CREATININE 0.63 05/21/2015 0640   CALCIUM 9.7 05/21/2015 0640   GFRNONAA >60 05/21/2015 0640   GFRAA >60 05/21/2015 0640   CBC    Component Value Date/Time   WBC 9.4 05/17/2015 0452   WBC 10.6* 12/17/2011 1528   RBC 4.16 05/17/2015 0452   RBC 5.02 12/17/2011 1528   HGB 11.6* 05/17/2015 0452   HGB 14.6 12/17/2011 1528   HCT 37.0 05/17/2015 0452   HCT 43.2 12/17/2011 1528   PLT 224 05/17/2015 0452   PLT 239 12/17/2011 1528   MCV 88.9 05/17/2015 0452   MCV 86.0 12/17/2011 1528   MCH 27.9 05/17/2015 0452   MCH 29.0 12/17/2011 1528   MCHC 31.4 05/17/2015 0452   MCHC 33.7 12/17/2011 1528   RDW 14.6 05/17/2015 0452   RDW 13.8 12/17/2011 1528   LYMPHSABS 3.8 05/17/2015 0452   LYMPHSABS 3.0 12/17/2011 1528   MONOABS 0.5 05/17/2015 0452   MONOABS 0.5 12/17/2011 1528   EOSABS 0.2 05/17/2015  0452   EOSABS 0.4 12/17/2011 1528   BASOSABS 0.0 05/17/2015 0452   BASOSABS 0.1 12/17/2011 1528   CBG's (last 3):    Recent Labs  05/26/15 1635 05/26/15 2125 05/27/15 0702  GLUCAP 86 82 84   LFT's Lab Results  Component Value Date   ALT 31 05/17/2015   AST 27 05/17/2015   ALKPHOS 74 05/17/2015   BILITOT 0.4 05/17/2015    Studies/Results: No results found.  Medications:  I have reviewed the patient's current medications. Scheduled Medications: . amLODipine  5 mg Oral Daily  . antiseptic oral rinse  7 mL Mouth Rinse BID  . atorvastatin  20 mg Oral q1800  . cloNIDine  0.1 mg Oral BID  . clopidogrel  75 mg Oral Daily  . enoxaparin (LOVENOX) injection  40 mg Subcutaneous Q24H  . famotidine  20 mg Oral BID  . feeding supplement (PRO-STAT SUGAR FREE 64)  30 mL Oral BID  . free water  100 mL Per Tube TID WC & HS  . glipiZIDE  5 mg Oral QAC breakfast  . hydrALAZINE  20 mg Oral 3 times per day  . hydrocerin   Topical BID  . insulin aspart  0-5 Units Subcutaneous QHS  . insulin aspart  0-9 Units Subcutaneous TID WC  . insulin glargine  24 Units Subcutaneous BID  . lisinopril  10 mg  Oral BID  . multivitamin with minerals  1 tablet Oral Daily  . nicotine  7 mg Transdermal Daily  . PARoxetine  10 mg Per Tube Daily  . sodium chloride  1 spray Each Nare TID  . thiamine  100 mg Oral Daily  . traMADol  25 mg Oral 4 times per day  . 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 Functional deficits secondary to Left MCA infarct, Right hemiplegia, apraxia, aphasia -continue CIR therapies 2. DVT Prophylaxis/Anticoagulation: Pharmaceutical: Lovenox 3. Chronic DDD/Fibromyalgia/Pain Management: Used oxycodone prn at home per family. Continue tylenol prn for now -appears comfortable at present 4. Mood: LCSW to follow for evaluation and support as mentation improves.  5. Neuropsych: This patient is not capable of making decisions on her own behalf. 6. Skin/Wound Care: Routine pressure relief measures -ocean nasal spray for dry nasal mucosa 7. Fluids/Electrolytes/Nutrition: eating reasonably well with supervision; without help, oral intake remains variable, fluid intake poor at around 300 mL per day, intake 20-40% of meals -continue H20 flushes through PEG. monitor for fluid overload 8. HTN: Monitor BP bid. On Lisinopril, hydralazine and catapres. reduced amlodipine to 5 mg, reduced Catapres to 0.1 mg 9. DM type 2: mild asymptomatic hypoglycemia 12/3 reviewed. Family has previously confirmed non-compliance with insulin at home. Will monitor BS ac/hs. Continue lantus bid with glipizide BID, no dose change for now - encourage po intake. Use SSI for elevated BS and titrate lantus as indicated.  - continue to monitor for hypoglycemia 10. Anxiety disorder: Used Paxil and ativan at home. Monitor as awareness improves.  -resumed low dose paxil--will need follow up with family about home dose 11. Acute pre-renal azotemia: increased water flushes to qid. BUN improved to 23.  12. 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 Thrush: s/p 2 days of diflucan 14. Constipation - improved; incontinent BM on 12/1 15. Urinary retention which is intermittent. Patient will need to try voiding on a toilet with assistance  Length of stay, days: 11   Luverne Zerkle A. Asa Lente, MD 05/27/2015, 9:19 AM

## 2015-05-27 NOTE — Plan of Care (Signed)
Problem: RH BLADDER ELIMINATION Goal: RH STG MANAGE BLADDER WITH ASSISTANCE STG Manage Bladder With max Assistance  Outcome: Not Progressing Urinary retention  Straight cath q 6-8 hours

## 2015-05-28 ENCOUNTER — Inpatient Hospital Stay (HOSPITAL_COMMUNITY): Payer: 59 | Admitting: Physical Therapy

## 2015-05-28 ENCOUNTER — Inpatient Hospital Stay (HOSPITAL_COMMUNITY): Payer: 59 | Admitting: Occupational Therapy

## 2015-05-28 ENCOUNTER — Inpatient Hospital Stay (HOSPITAL_COMMUNITY): Payer: 59 | Admitting: Speech Pathology

## 2015-05-28 LAB — GLUCOSE, CAPILLARY
GLUCOSE-CAPILLARY: 114 mg/dL — AB (ref 65–99)
GLUCOSE-CAPILLARY: 111 mg/dL — AB (ref 65–99)
Glucose-Capillary: 83 mg/dL (ref 65–99)
Glucose-Capillary: 99 mg/dL (ref 65–99)

## 2015-05-28 MED ORDER — ENSURE ENLIVE PO LIQD: 237.0000 mL | Freq: Every day | ORAL | Status: DC

## 2015-05-28 NOTE — Progress Notes (Signed)
Physical Therapy Session Note  Patient Details  Name: Kerry [name] MRN: [id number] Date of Birth: 01-28-1956  Today's Date: 05/28/2015 PT Individual Time: 1430-1530 PT Individual Time Calculation (min): 60 min   Short Term Goals: Week 2:  PT Short Term Goal 1 (Week 2): Pt will perform sit <>supine minA with HOB flat to simulate home environment PT Short Term Goal 2 (Week 2): Pt will perform squat pivot transfer with consistent modA +1 PT Short Term Goal 3 (Week 2): Pt will perform gait with LRAD x30' modA +1 and w/c follow for safety  PT Short Term Goal 4 (Week 2): Pt will perform ascent/descent three 3-inch height stairs with maxA +1  Skilled Therapeutic Interventions/Progress Updates:    Pt received supine in bed; no evidence of pain initially however once up in w/c and through questioning pt indicates she has a headache. Vitals assessed; BP 136/67 HR 94 bpm O2 99%. RN alerted to pain and pt given medication. W/c propulsion with LUE with minA; L footrest removed to allow pt to use LLE to A with navigation, however pt puts legrest back on and waves away therapist when attempting to explain purpose of using LLE. Gait x2 trials for 109' and 15' with maxA and w/c follow. TotalA for RLE progression, and ace wrap dorsiflexion assist for safety, modA for RLE stance control, facilitation at glutes and quads. Pt appears to be in pain during second trial, and pt returned to seated position, vitals assessed WNL. Sitting balance with cues for thoracic extension, scapular retraction, and LUE reaching up and to L side to facilitate L weight shift due to pt occasional pushing with L extremities. Squat pivot w/c <>mat table with modA. Pt returned to room totalA, transfer to bed modA, sit >supine minA for RLE and RUE management and safety. Remained supine in bed with alarm intact and all needs within reach at completion of session.    Therapy Documentation Precautions:  Precautions Precautions:  Fall Precaution Comments: Peg, Rt shoulder subluxation Restrictions Weight Bearing Restrictions: No Pain: Pain Assessment Pain Assessment: Faces Faces Pain Scale: Hurts little more Pain Type: Acute pain Pain Location: Head Pain Orientation: Other (Comment) (unable to determine d/t aphasia) Pain Intervention(s): Other (Comment);RN made aware;Medication (See eMAR) Multiple Pain Sites: No   See Function Navigator for Current Functional Status.   Therapy/Group: Individual Therapy  Luberta Mutter 05/28/2015, 7:35 PM

## 2015-05-28 NOTE — Progress Notes (Signed)
Speech Language Pathology Daily Session Note  Patient Details  Name: Kerry [name] MRN: [id number] Date of Birth: 12-Oct-1955  Today's Date: 05/28/2015 SLP Individual Time: 1003-1100 SLP Individual Time Calculation (min): 57 min  Short Term Goals: Week 2: SLP Short Term Goal 1 (Week 2): Patient will consume Dys.1 textures and honey thick liquids with Min tactile cues for pacing and portion control to minimize overt s/s of aspiration over three consecutive sessions prior to initiation of nectar thick liquids trials.  SLP Short Term Goal 2 (Week 2): Patient will vocalize in 25% of opportunities with Max assit multimodal cues   SLP Short Term Goal 3 (Week 2): Patient will follow 1-step directions during familiar tasks with Max assist gesture cues SLP Short Term Goal 4 (Week 2): Pt will match object to phrase from a field of three with max assist visual cues.    Skilled Therapeutic Interventions:  Pt was seen for skilled ST targeting communication goals.  Pt was able to match object to written word from a field of 3 for 60% accuracy, improving to 90% accuracy with min assist visual cues.  SLP increased task complexity with phrase matching.  Pt was independently 50% accurate for matching object to written phrase, which improved to 100% accuracy with mod assist visual cues.  Pt continues to exhibit difficulty following 1 step commands.  She was able to follow 1 out of 10 commands independently, improving to 3 out of 10 commands with mod assist visual demonstration cues, and improving further to 9 out of 10 commands with max assist visual and tactile cues.  Pt answered simple, concrete yes/no questions by pointing to written visual aid in 7 out of 19 opportunities, improving to 9 out of 19 with min assist visual cues, 13 out of 19 with mod assist visual cues, and 18 out of 19 with max assist visual cues.  Pt was returned to room and left in wheelchair with call bell within reach and quick release belt  donned.  Continue per current plan of care.     Function:  Eating Eating                 Cognition Comprehension Comprehension assist level: Understands basic 50 - 74% of the time/ requires cueing 25 - 49% of the time  Expression   Expression assist level: Expresses basis less than 25% of the time/requires cueing >75% of the time.  Social Interaction Social Interaction assist level: Interacts appropriately 25 - 49% of time - Needs frequent redirection.  Problem Solving Problem solving assist level: Solves basic 25 - 49% of the time - needs direction more than half the time to initiate, plan or complete simple activities  Memory Memory assist level: Recognizes or recalls 50 - 74% of the time/requires cueing 25 - 49% of the time    Pain Pain Assessment Pain Assessment: No/denies pain Pain Score: 0-No pain  Therapy/Group: Individual Therapy  Vyom Brass, Selinda Orion 05/28/2015, 12:25 PM

## 2015-05-28 NOTE — Progress Notes (Signed)
Nutrition Follow-up  DOCUMENTATION CODES:   Not applicable  INTERVENTION:  Continue 30 ml Prostat po BID, each supplement provides 100 kcal and 15 grams of protein.   Provide Ensure Enlive po once daily (thickened to honey thick), each supplement provides 350 kcal and 20 grams of protein.  Continue free water flushes via tube.   Encourage adequate PO intake.   NUTRITION DIAGNOSIS:   Increased nutrient needs related to  (increased energy expenditure with participation of therapies ) as evidenced by estimated needs; ongoing  GOAL:   Patient will meet greater than or equal to 90% of their needs; progressing  MONITOR:   PO intake, Supplement acceptance, Diet advancement, Weight trends, Labs, I & O's  REASON FOR ASSESSMENT:   Consult Calorie Count  ASSESSMENT:   Pt admitted after MCA stroke. PEG placed during hospitalization.   Meal completion has been 15-50%. Pt currently has Prostat ordered and has been consuming them. RD to additionally order Ensure to aid in caloric and protein needs. Continue to encourage po intake.   Diet Order:  DIET - DYS 1 Room service appropriate?: Yes; Fluid consistency:: Honey Thick  Skin:  Reviewed, no issues  Last BM:  12/3  Height:   Ht Readings from Last 1 Encounters:  05/16/15 5\' 10"  (1.778 m)    Weight:   Wt Readings from Last 1 Encounters:  05/23/15 182 lb 12.2 oz (82.9 kg)    Ideal Body Weight:  68.1 kg  BMI:  Body mass index is 26.22 kg/(m^2).  Estimated Nutritional Needs:   Kcal:  1800-2000  Protein:  85-95 grams  Fluid:  1.7 - 1.9 L/day  EDUCATION NEEDS:   No education needs identified at this time  Corrin Parker, MS, RD, LDN Pager # [phone] After hours/ weekend pager # [phone]

## 2015-05-28 NOTE — Progress Notes (Signed)
[place] PHYSICAL MEDICINE & REHABILITATION     PROGRESS NOTE    Subjective/Complaints: A aphasic and apraxic, continues to vocalize during exhalations no respiratory distress  Unable to perform ROS due to aphasia  Objective: Vital Signs: Blood pressure 127/60, pulse 64, temperature 98.2 F (36.8 C), temperature source Oral, resp. rate 18, weight 82.9 kg (182 lb 12.2 oz), SpO2 97 %. No results found. No results for input(s): WBC, HGB, HCT, PLT in the last 72 hours. No results for input(s): NA, K, CL, GLUCOSE, BUN, CREATININE, CALCIUM in the last 72 hours.  Invalid input(s): CO CBG (last 3)   Recent Labs  05/27/15 1631 05/27/15 2039 05/28/15 0708  GLUCAP 81 109* 83    Wt Readings from Last 3 Encounters:  05/23/15 82.9 kg (182 lb 12.2 oz)  05/16/15 86.183 kg (190 lb)  04/27/15 92.5 kg (203 lb 14.8 oz)    Physical Exam:  Constitutional: She appears well-developed and well-nourished. No distress.  Sitting up in bed and comfortable  HENT: Trach site is clean no evidence of a fairly Head: Normocephalic and atraumatic.  Mouth/Throat: Oropharynx is   Moist still with white film on tongue   Missing upper teeth and multiple lower teeth.  Eyes: Conjunctivae and EOM are normal. Pupils are equal, round, and reactive to light.  Neck: Normal range of motion. Neck supple.  Cardiovascular: Normal rate and regular rhythm. Exam reveals no friction rub.  Respiratory: Effort normal. No respiratory distress. She makes stridorous sounds when breathing GI: Soft. Bowel sounds are normal. She exhibits no distension. There is no tenderness.  PEG site clean and dry without drainage.  Musculoskeletal: She exhibits no edema or tenderness.  Neurological: She is alert.  Did not make attempts at phonation.  Perseverative behaviors noted but able to hold comb and use to brush hair.   Apraxia noted. occasionally with an appropriate y/n nod. Poor oral-motor control Inconsistently follows  commands with perseveration  Lacks insight and awareness of deficits.  Unable to perform motor and sensory testing, Grossly 4/5 in LUE and LLE.No active movement right upper or right lower extremity Withdraws to pinch in the left upper and left lower but not in the right upper and right lower DTRs 3+ on right side    Skin: Skin is warm and dry. No rash noted. She is not diaphoretic. No erythema.  Psychiatric: She is not slowed and not withdrawn. Cognition and memory are impaired. She is noncommunicative. She is inattentive to right.     Assessment/Plan: 1. 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 Physiatrist is providing close team supervision and 24 hour management of active medical problems listed below. Physiatrist and rehab team continue to assess barriers to discharge/monitor patient progress toward functional and medical goals.  Function:  Bathing Bathing position   Position: Sitting EOB  Bathing parts Body parts bathed by patient: Chest, Abdomen, Right arm, Right upper leg, Left upper leg Body parts bathed by helper: Left arm, Right lower leg, Left lower leg, Back  Bathing assist Assist Level:  (Mod assist)      Upper Body Dressing/Undressing Upper body dressing   What is the patient wearing?: Pull over shirt/dress     Pull over shirt/dress - Perfomed by patient: Thread/unthread left sleeve, Put head through opening, Pull shirt over trunk Pull over shirt/dress - Perfomed by helper: Thread/unthread right sleeve, Pull shirt over trunk  Upper body assist Assist Level: Touching or steadying assistance(Pt > 75%)      Lower Body Dressing/Undressing Lower body dressing   What is the patient wearing?: Pants, Socks, Shoes     Pants- Performed by patient: Thread/unthread left pants leg Pants- Performed by helper: Thread/unthread right pants  leg, Pull pants up/down   Non-skid slipper socks- Performed by helper: Don/doff right sock, Don/doff left sock Socks - Performed by patient: Don/doff right sock Socks - Performed by helper: Don/doff left sock Shoes - Performed by patient: Don/doff right shoe Shoes - Performed by helper: Don/doff left shoe          Lower body assist Assist for lower body dressing:  (Max assist, as pt required steadying of leg when crossed over opposite knee to assist with donning pants, socks, and shoes)      Toileting Toileting Toileting activity did not occur: No continent bowel/bladder event        Toileting assist     Transfers Chair/bed transfer   Chair/bed transfer method: Squat pivot Chair/bed transfer assist level: Moderate assist (Pt 50 - 74%/lift or lower) Chair/bed transfer assistive device: Armrests     Locomotion Ambulation Ambulation activity did not occur: Safety/medical concerns   Max distance: 2 Assist level: 2 helpers   Oceanographer activity did not occur: Safety/medical concerns (pt in tilt-in-space w/c, unable to propel) Type: Manual Max wheelchair distance: 150 Assist Level: Touching or steadying assistance (Pt > 75%)  Cognition Comprehension Comprehension assist level: Understands basic 50 - 74% of the time/ requires cueing 25 - 49% of the time  Expression Expression assist level: Expresses basis less than 25% of the time/requires cueing >75% of the time.  Social Interaction Social Interaction assist level: Interacts appropriately 25 - 49% of time - Needs frequent redirection.  Problem Solving Problem solving assist level: Solves basic 25 - 49% of the time - needs direction more than half the time to initiate, plan or complete simple activities  Memory Memory assist level: Recognizes or recalls 50 - 74% of the time/requires cueing 25 - 49% of the time   Medical Problem List and Plan: 1. Functional deficits secondary to Left MCA infarct, Right hemiplegia,  apraxia, aphasia  -continue CIR therapies 2. DVT Prophylaxis/Anticoagulation: Pharmaceutical: Lovenox 3. Chronic DDD/Fibromyalgia/Pain Management: Used oxycodone prn at home per family. Continue tylenol prn for now  -appears comfortable at present 4. Mood: LCSW to follow for evaluation and support as mentation improves.  5. Neuropsych: This patient is not capable of making decisions on her own behalf. 6. Skin/Wound Care: Routine pressure relief measures  -ocean nasal spray for dry nasal mucosa 7. Fluids/Electrolytes/Nutrition:eating quite well with supervision, By mouth intake remains variable, fluid intake poor at around 300 mL per day, intake 20-40% of meals  -continue H20 flushes through PEG.Will increase volume, monitor for fluid overload 8. HTN: Monitor BP bid. On Lisinopril, hydralazine and catapres. reduce amlodipine to 5 mg, reduce Catapres to 0.1 mg, BP 127/60 this am 9. DM type 2: Family reports non-compliance with insulin at home. Will monitor BS ac/hs. Continue lantus bid with glipizide BID. Use SSI for elevated BS and titrate lantus as indicated.   -good control at present.cbg's reviewed CBG, well controlled at present,98 this am, monitor hypoglycemia 10. Anxiety disorder: Used Paxil and ativan at home. Monitor as awareness improves.   -resumed low dose paxil--follow up with family about home dose 11. Acute pre-renal azotemia: increased water flushes to qid. BUN stable at 23.  12. Hx  VDRF:trach site well healed  --continues to be comfortable and managing airway well at present  -high aspiration risk, no current evidence of asp PNA  -full supervision with meals, D1 honeyEncourage intake 13. Thrush: 2 days of diflucan 14. Constipation did have incontinent BM on 12/2, adjust laxatives 15. Urinary retention which is intermittent. Patient will need to try voiding on a toilet with assistance  16.  severe dysphasia status post stroke, Continue honey thick liquids, monitor for  aspiration LOS (Days) 12 A FACE TO FACE EVALUATION WAS PERFORMED  Charlett Blake 05/28/2015 8:39 AM

## 2015-05-28 NOTE — Progress Notes (Signed)
Occupational Therapy Session Note  Patient Details  Name: Kerry [name] MRN: [id number] Date of Birth: February 25, 1956  Today's Date: 05/28/2015 OT Individual Time: [id number] and [id number] OT Individual Time Calculation (min): 56 min and 60 min    Short Term Goals: Week 2:  OT Short Term Goal 1 (Week 2): Pt will complete bathing with min assist at sit > stand level OT Short Term Goal 2 (Week 2): Pt will complete LB dressing with mod assist at sit > stand level OT Short Term Goal 3 (Week 2): Pt will complete toilet transfer to regular commode with mod assist of 1 caregiver OT Short Term Goal 4 (Week 2): Pt will complete 1 grooming task in standing with min assist for standing balance  Skilled Therapeutic Interventions/Progress Updates:    1) ADL retraining with focus on trunk control, attention to Rt arm and environment, and transfers.  Completed bathing and dressing at EOB with towel placed under Rt hip to promote midline sitting balance, with pt demonstrating improved sitting during static and dynamic tasks.  Pt would still demonstrate LOB but was able to correct by reaching for stable item with LUE.  Pt with carryover of LB dressing when therapist supported leg across opposite knee to allow pt to attempt threading pant legs, socks, and shoes. Mod cues and encouragement to attempt LB dressing. Mod assist sit > stand for LB dressing, requiring assist to pull pants over Rt hip and support at RLE to promote proper positioning. Squat pivot to w/c with mod assist with pt demonstrating improved initiation with positioning body and initiating transfer once set up, requiring mod assist to help position hips appropriately in chair. Engaged in [place] with focus on scanning to and visually attending to Rt visual field as well as initiation.  Completed Mode A x3 with pt progressing from average reaction time of 4.8 to 3.0 seconds.  Pt's reaction time to illuminated buttons in Rt visual field also  progressed from 7.93 in upper quadrant and 7.20 in lower quadrant to 4.02 in upper and 4.05 in lower.  Utilized signing of Christmas songs throughout self-care tasks to promote automatic verbalizations and encourage engagement in task.  2) Treatment session with focus on trunk control, sit > stand, and standing tolerance.  Pt in bed upon arrival, bed mobility with min-mod assist and cues to reach to Lt to promote weight shifting as pt tends to lose balance to Rt.  Completed block practice of sit > stand with focus on anterior weight shift, utilizing reaching forward with LUE to further promote weight shift.  Pt's RUE placed on therapist shoulder and supported underneath to provide support and promote proper positioning at scapula.  Pt participated in pet therapy, with pt's face brightening at sight of therapy dog.  Pt shaking head no to sitting up in chair, therefore returned to bed at end of session.  Therapy Documentation Precautions:  Precautions Precautions: Fall Precaution Comments: Peg, Rt shoulder subluxation Restrictions Weight Bearing Restrictions: No Pain: Pain Assessment Pain Assessment: No/denies pain Pain Score: 0-No pain  See Function Navigator for Current Functional Status.   Therapy/Group: Individual Therapy  Simonne Come 05/28/2015, 12:23 PM

## 2015-05-29 ENCOUNTER — Inpatient Hospital Stay (HOSPITAL_COMMUNITY): Payer: 59 | Admitting: Occupational Therapy

## 2015-05-29 ENCOUNTER — Inpatient Hospital Stay (HOSPITAL_COMMUNITY): Payer: 59 | Admitting: Physical Therapy

## 2015-05-29 ENCOUNTER — Inpatient Hospital Stay (HOSPITAL_COMMUNITY): Payer: 59 | Admitting: Speech Pathology

## 2015-05-29 LAB — URINALYSIS, ROUTINE W REFLEX MICROSCOPIC
BILIRUBIN URINE: NEGATIVE
GLUCOSE, UA: NEGATIVE mg/dL
Hgb urine dipstick: NEGATIVE
KETONES UR: NEGATIVE mg/dL
NITRITE: POSITIVE — AB
PH: 6 (ref 5.0–8.0)
PROTEIN: NEGATIVE mg/dL
Specific Gravity, Urine: 1.009 (ref 1.005–1.030)

## 2015-05-29 LAB — GLUCOSE, CAPILLARY
GLUCOSE-CAPILLARY: 91 mg/dL (ref 65–99)
GLUCOSE-CAPILLARY: 90 mg/dL (ref 65–99)
GLUCOSE-CAPILLARY: 73 mg/dL (ref 65–99)
GLUCOSE-CAPILLARY: 136 mg/dL — AB (ref 65–99)
Glucose-Capillary: 93 mg/dL (ref 65–99)

## 2015-05-29 LAB — URINE MICROSCOPIC-ADD ON

## 2015-05-29 MED ORDER — FREE WATER
150.0000 mL | Freq: Three times a day (TID) | Status: DC
  Administered 2015-05-29 – 2015-06-06 (×32): 150 mL

## 2015-05-29 NOTE — Progress Notes (Signed)
Occupational Therapy Session Note  Patient Details  Name: Kerry [name] MRN: [id number] Date of Birth: 1956/04/25  Today's Date: 05/29/2015 OT Individual Time: 1105-1200 OT Individual Time Calculation (min): 55 min    Short Term Goals: Week 2:  OT Short Term Goal 1 (Week 2): Pt will complete bathing with min assist at sit > stand level OT Short Term Goal 2 (Week 2): Pt will complete LB dressing with mod assist at sit > stand level OT Short Term Goal 3 (Week 2): Pt will complete toilet transfer to regular commode with mod assist of 1 caregiver OT Short Term Goal 4 (Week 2): Pt will complete 1 grooming task in standing with min assist for standing balance  Skilled Therapeutic Interventions/Progress Updates:    ADL retraining with focus on trunk control, attention to Rt arm, transfers, and increased participation in self-care tasks.  Performed squat pivot transfer bed > roll in shower chair with mod assist with 2nd person present for safety.  Pt demonstrating improved sequencing and initiation with bathing at shower level.  Constant cues to weight shift to Lt due to Rt lateral lean in sitting, utilizing shower wall as barrier to encourage weight shift all the way to wall.  Dressing completed seated on EOB with pt threading pant legs while therapist supported leg across opposite knee.  Sit > stand with mod assist with pt able to pull pants over hips on Lt side with assist from therapist to pull up Rt side. Squat pivot to w/c with mod assist with pt initiating movement and then requiring manual facilitation for full weight shift.    Therapy Documentation Precautions:  Precautions Precautions: Fall Precaution Comments: Peg, Rt shoulder subluxation Restrictions Weight Bearing Restrictions: No Pain: Pain Assessment Pain Assessment: Faces Faces Pain Scale: No hurt  See Function Navigator for Current Functional Status.   Therapy/Group: Individual Therapy  Simonne Come 05/29/2015, 12:28  PM

## 2015-05-29 NOTE — Progress Notes (Signed)
Physical Therapy Session Note  Patient Details  Name: SIEANNA FATTA MRN: [id number] Date of Birth: 08-12-1955  Today's Date: 05/29/2015 PT Individual Time: 0900-1000 PT Individual Time Calculation (min): 60 min   Short Term Goals: Week 2:  PT Short Term Goal 1 (Week 2): Pt will perform sit <>supine minA with HOB flat to simulate home environment PT Short Term Goal 2 (Week 2): Pt will perform squat pivot transfer with consistent modA +1 PT Short Term Goal 3 (Week 2): Pt will perform gait with LRAD x30' modA +1 and w/c follow for safety  PT Short Term Goal 4 (Week 2): Pt will perform ascent/descent three 3-inch height stairs with maxA +1  Skilled Therapeutic Interventions/Progress Updates:    Pt received supine in bed, no evidence of pain via FACES scale with pt non-verbal. Rolling R/L to A with upper and lower body dressing, modA for UB dressing, maxA for LB dressing. Squat pivot transfer bed>w/c modA to L side. W/c propulsion x100' with minA due to decreased LLE contact with floor to A with navigation. Standing frame x20 min for RLE weight bearing, prolonged R gastroc/soleus stretch, while pt engaged in LUE writing tasks and playing games on white board. Female acquaintance arrived to see pt; therapist was unfamiliar with the individual, questioned regarding relationship to patient and he reports he is the patient's boyfriend who she lives with. Although pt is aphasic, no evidence of pt disliking him or wanting him to leave. Wil discuss with CSW as previously discussed d/c plan was to home with husband and children. Pt returned to room totalA with suspicion of bowel movement. Standing at sink 2 x 3-4 min each with mod/maxA +1 for standing balance with UE support on sink, and +2 A for changing brief, hygiene and pants. Transfer w/c >bed modA squat pivot. Remained supine in bed with alarm intact and all needs in reach at completion of session.   Therapy Documentation Precautions:   Precautions Precautions: Fall Precaution Comments: Peg, Rt shoulder subluxation Restrictions Weight Bearing Restrictions: No Pain: Pain Assessment Pain Assessment: Faces Faces Pain Scale: No hurt   See Function Navigator for Current Functional Status.   Therapy/Group: Individual Therapy  Luberta Mutter 05/29/2015, 10:52 AM

## 2015-05-29 NOTE — Progress Notes (Signed)
Speech Language Pathology Daily Session Note  Patient Details  Name: Kerry [name] MRN: [id number] Date of Birth: 1956-01-08  Today's Date: 05/29/2015 SLP Individual Time: 1403-1500 SLP Individual Time Calculation (min): 57 min  Short Term Goals: Week 2: SLP Short Term Goal 1 (Week 2): Patient will consume Dys.1 textures and honey thick liquids with Min tactile cues for pacing and portion control to minimize overt s/s of aspiration over three consecutive sessions prior to initiation of nectar thick liquids trials.  SLP Short Term Goal 2 (Week 2): Patient will vocalize in 25% of opportunities with Max assit multimodal cues   SLP Short Term Goal 3 (Week 2): Patient will follow 1-step directions during familiar tasks with Max assist gesture cues SLP Short Term Goal 4 (Week 2): Pt will match object to phrase from a field of three with max assist visual cues.    Skilled Therapeutic Interventions:  Pt was seen for skilled ST targeting goals for language and dysphagia.  Prior to transferring out of bed to wheelchair, pt indicated that she had been incontinent by saying an approximation of the word "dirty" and pointing to her soiled brief. Pt was changed into a clean brief and pants and transferred to wheelchair with assistance from RN.  SLP facilitated the session with trials of thin liquids via teaspoons and cup sips following thorough oral care to continue working towards liquids advancement.  Pt consumed thin liquids via teaspoon without overt s/s of aspiration; however, with trials of thin liquids via cup sips, pt demonstrated multiple, audible swallows followed by immediate cough in 50% of opportunities.  SLP will continue to follow up for trials of advanced consistencies to continue to assess readiness for repeat objective swallow study.  Pt was 30% independent for matching object to written phrase from a field of three, improving to 70% accuracy with min assist visual cues, and 100% accuracy  with max assist visual cues.    Pt was 60% independent for answering yes/no questions with written visual aid, improving to 65% accuracy with min assist, and 100% accuracy with mod assist visual cues.  Pt recited automatic sequences within a familiar song for 25/26 targets with max assist verbal and visual cues to recognize and correct perseverative verbal errors.  Pt was returned to room and left in wheelchair with quick release belt donned and call bell left within reach.  Continue per current plan of care.    Function:  Eating Eating   Modified Consistency Diet: Yes Eating Assist Level: Supervision or verbal cues;Set up assist for   Eating Set Up Assist For: Opening containers       Cognition Comprehension Comprehension assist level: Understands basic 50 - 74% of the time/ requires cueing 25 - 49% of the time  Expression   Expression assist level: Expresses basis less than 25% of the time/requires cueing >75% of the time.  Social Interaction Social Interaction assist level: Interacts appropriately 50 - 74% of the time - May be physically or verbally inappropriate.  Problem Solving Problem solving assist level: Solves basic 25 - 49% of the time - needs direction more than half the time to initiate, plan or complete simple activities  Memory Memory assist level: Recognizes or recalls 25 - 49% of the time/requires cueing 50 - 75% of the time    Pain Pain Assessment Pain Assessment: Faces Faces Pain Scale: No hurt  Therapy/Group: Individual Therapy  Hopie Pellegrin, Selinda Orion 05/29/2015, 4:14 PM

## 2015-05-29 NOTE — Progress Notes (Signed)
Occupational Therapy Session Note  Patient Details  Name: Kerry [name] MRN: [id number] Date of Birth: [birth year]  Today's Date: 05/29/2015 OT Individual Time: 1515-1550 OT Individual Time Calculation (min): 35 min    Short Term Goals: Week 2:  OT Short Term Goal 1 (Week 2): Pt will complete bathing with min assist at sit > stand level OT Short Term Goal 2 (Week 2): Pt will complete LB dressing with mod assist at sit > stand level OT Short Term Goal 3 (Week 2): Pt will complete toilet transfer to regular commode with mod assist of 1 caregiver OT Short Term Goal 4 (Week 2): Pt will complete 1 grooming task in standing with min assist for standing balance  Skilled Therapeutic Interventions/Progress Updates:  Upon entering the room, pt seated in wheelchair and gesturing to bed and pants. OT transferred pt from wheelchair >bed with mod A. Pt requiring min A to return to supine. Pt rolled L <>R with mod A with use of bed rail for clothing management. Pt was not incontinent this session and able to pull L side of pants over hip herself. OT assisted pt with positioning in bed in order to protect hemiplegic side and subluxation of R UE. Pt falling asleep at end of session with call bell and all needed items placed within reach. Bed alarm activated.   Therapy Documentation Precautions:  Precautions Precautions: Fall Precaution Comments: Peg, Rt shoulder subluxation Restrictions Weight Bearing Restrictions: No Vital Signs: Therapy Vitals Temp: 98.2 F (36.8 C) Temp Source: Oral Pulse Rate: 100 Resp: 18 BP: (!) 146/81 mmHg Patient Position (if appropriate): Sitting Oxygen Therapy SpO2: 99 % O2 Device: Not Delivered Pain: Pain Assessment Pain Assessment: Faces Faces Pain Scale: No hurt  See Function Navigator for Current Functional Status.   Therapy/Group: Individual Therapy  Phineas Semen 05/29/2015, 4:21 PM

## 2015-05-30 ENCOUNTER — Inpatient Hospital Stay (HOSPITAL_COMMUNITY): Payer: 59 | Admitting: Physical Therapy

## 2015-05-30 ENCOUNTER — Inpatient Hospital Stay (HOSPITAL_COMMUNITY): Payer: 59 | Admitting: Occupational Therapy

## 2015-05-30 ENCOUNTER — Inpatient Hospital Stay (HOSPITAL_COMMUNITY): Payer: 59 | Admitting: Speech Pathology

## 2015-05-30 DIAGNOSIS — R32 Unspecified urinary incontinence: Secondary | ICD-10-CM

## 2015-05-30 DIAGNOSIS — R159 Full incontinence of feces: Secondary | ICD-10-CM

## 2015-05-30 LAB — GLUCOSE, CAPILLARY
Glucose-Capillary: 90 mg/dL (ref 65–99)
Glucose-Capillary: 130 mg/dL — ABNORMAL HIGH (ref 65–99)
Glucose-Capillary: 81 mg/dL (ref 65–99)
Glucose-Capillary: 101 mg/dL — ABNORMAL HIGH (ref 65–99)

## 2015-05-30 NOTE — Progress Notes (Signed)
Social Work Patient ID: Kerry [name], female   DOB: Mar 22, 1956, 59 y.o.   MRN: [id number] Met with pt and husband who was here and spoke with Matt-son via telephone to discuss team conference progression toward her goals and discharge target is till 12/17. To have MBS next week and work on diet teaching. Family education needs to begin next week for the whole week if need be, until family is comfortable with pt's care. Husband and son on board and will let their other son know to be here next week. Will work on  discharge needs for next Sat.

## 2015-05-30 NOTE — Patient Care Conference (Addendum)
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 Hemiparesis, aphasia, and dysphagia as late effect of cerebrovascular accident (CVA) ([place])   . Essential hypertension   . Coronary artery disease involving native coronary artery of native heart with angina pectoris ([place])   . History of breast cancer   . Fibromyalgia   . Chronic pain syndrome   . Generalized anxiety disorder   . Prerenal azotemia   . Pneumonia   . Chronic respiratory failure ([place])   . Respiratory failure ([place])   . Hypokalemia   . Compromised airway   . HLD (hyperlipidemia)   . Type 2 diabetes mellitus with circulatory disorder ([place])   . Sinus bradycardia   . Asystole ([place])   . Acute respiratory failure ([place])   . Cerebral hemorrhage ([place])   . Cytotoxic cerebral edema ([place])   . Sinus pause   . Cerebrovascular accident (CVA) due to occlusion of left middle cerebral artery ([place])   . Cerebrovascular accident (CVA) due to thrombosis of precerebral artery ([place])   . Encounter for central line placement   . Encounter for feeding tube placement   . CVA (cerebral infarction) 04/11/2015  . Stroke (cerebrum) ([place]) 04/11/2015  . Aphasia   . 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 Hypertensive urgency   .  Uncontrolled type 2 diabetes mellitus with hyperosmolarity without coma, without long-term current use of insulin ([place])   . Breast cancer ([place]) 11/08/2012    Expected Discharge Date: Expected Discharge Date: 06/09/15  Team Members Present: Physician leading conference: Dr. Alysia Penna Social Worker Present: Ovidio Kin, LCSW Nurse Present: Heather Roberts, RN PT Present: Kem Parkinson, PT OT Present: Simonne Come, OT SLP Present: Windell Moulding, SLP PPS Coordinator present : Daiva Nakayama, RN, CRRN     Current Status/Progress Goal Weekly Team Focus  Medical   Poor by mouth intake liquid greater than caloric, still getting PEG flushes  Home with adequate oral intake of pills and fluids  See above   Bowel/Bladder   Incontinent of bowel and bladder; intermittent caths for retention  Mod assist  Assess and treat for constipation as needed; continue PVR checks and cath for high volumes or no void   Swallow/Nutrition/ Hydration   Dys 1, honey thick liquids, full supervision for use of swallowing precautions   min assist   therapeutic trials of ice chips to continue to assess readiness for repeat MBS   ADL's   mod assist bathing from roll-in shower seat, mod assist sit > stand at EOB, mod-max assist transfer, min assist unsupported sitting balance  min assist overall, mod assist LB dressing and toileting  dynamic sitting balance, sit > stand, RUE NMR, transfers   Mobility   minA bed mobility, modA squat pivot  transfers, minA w/c propulsion, maxA gait with rail w/c follow  S bed mobility, transfers/w/c propulsion/ minA, gait modA with therapy  RLE NMR, sit <>stand and standing balance, transfers, gait training   Communication   mod-max assist for receptive language,  max to total assist for verbal expression, improvements noted in ability to receptively identify familiar objects and answer simple yes/no questions   Mod assist   continue to address receptive and expressive language     Safety/Cognition/ Behavioral Observations  improving intellectual awareness of verbal errors, but continues to require max to total assist to correct perseverative verbal errors, right inattention  LTG and STG for cognition deferred at this time to allow ST to address communication deficits       Pain   C/o pain in right shoulder and lower back; on scheduled ultram- tylenol 650mg  po q4h prn  < 4  Assess and treat for pain q shift and prn   Skin   MASD to right breast fold- microguard powder  No skin breakdown or infection with mod assist  Assess skin q shift and prn      *See Care Plan and progress notes for long and short-term goals.  Barriers to Discharge: Severe neurologic deficits no improvement in strength as far    Possible Resolutions to Barriers:  Continue rehabilitation program, anticipate longer length of stay    Discharge Planning/Teaching Needs:  Family here daily participating and learning her care. Committed to pt and providing her care      Team Discussion:  Progressing with Yes/no's and gestures her needs. Pain in shoulder and back managed with scheduled ultram. r-field cut with inattention. Decreased awareness and sequencing. MBS next week. Timed toileting working on, some urinary retension. Focus on family education next week with family to prepare to go home next Sat.  Revisions to Treatment Plan:  None   Continued Need for Acute Rehabilitation Level of Care: The patient requires daily medical management by a physician with specialized training in physical medicine and rehabilitation for the following conditions: Daily direction of a multidisciplinary physical rehabilitation program to ensure safe treatment while eliciting the highest outcome that is of practical value to the patient.: Yes Daily medical management of patient stability for increased activity during participation in an intensive rehabilitation regime.: Yes Daily analysis of laboratory values and/or  radiology reports with any subsequent need for medication adjustment of medical intervention for : Neurological problems;Other  Albeiro Trompeter, Gardiner Rhyme 05/30/2015, 4:00 PM

## 2015-05-30 NOTE — Progress Notes (Signed)
[facility]IC[facility] RE[facility]ILIT[facility]     PROGRESS NOTE    Subjective/Complaints: aphasic and apraxic, continues to vocalize during exhalations no respiratory distress  Unable to perform ROS due to aphasia  Objective: Vital Signs: Blood pressure 122/57, pulse 69, temperature 97.7 F (36.5 C), temperature source Oral, resp. rate 21, weight 82.9 kg (182 lb 12.2 oz), SpO2 99 %. No results found. No results for input(s): WBC, HGB, HCT, PLT in the last 72 hours. No results for input(s): NA, K, CL, GLUCOSE, BUN, CREATININE, CALCIUM in the last 72 hours.  Invalid input(s): CO CBG (last 3)   Recent Labs  05/29/15 2119 05/29/15 2250 05/30/15 0710  GLUCAP 73 136* 90    Wt Readings from Last 3 Encounters:  05/23/15 82.9 kg (182 lb 12.2 oz)  05/16/15 86.183 kg (190 lb)  04/27/15 92.5 kg (203 lb 14.8 oz)    Physical Exam:  Constitutional: She appears well-developed and well-nourished. No distress.  Sitting up in bed and comfortable  HENT: Trach site is clean no evidence of a fairly Head: Normocephalic and atraumatic.  Mouth/Throat: Oropharynx is   Moist still with white film on tongue   Missing upper teeth and multiple lower teeth.  Eyes: Conjunctivae and EOM are normal. Pupils are equal, round, and reactive to light.  Neck: Normal range of motion. Neck supple.  Cardiovascular: Normal rate and regular rhythm. Exam reveals no friction rub.  Respiratory: Effort normal. No respiratory distress. She makes stridorous sounds when breathing GI: Soft. Bowel sounds are normal. She exhibits no distension. There is no tenderness.  PEG site clean and dry without drainage.  Musculoskeletal: She exhibits no edema or tenderness.  Neurological: She is alert.  Did not make attempts at phonation.  Perseverative behaviors noted but able to hold comb and use to brush hair.   Apraxia noted. occasionally with an appropriate y/n nod. Poor oral-motor control Inconsistently follows  commands with perseveration  Lacks insight and awareness of deficits.  Unable to perform motor and sensory testing, Grossly 4/5 in LUE and LLE.No active movement right upper or right lower extremity Withdraws to pinch in the left upper and left lower but not in the right upper and right lower DTRs 3+ on right side    Skin: Skin is warm and dry. No rash noted. She is not diaphoretic. No erythema.  Psychiatric: She is not slowed and not withdrawn. Cognition and memory are impaired. She is noncommunicative. She is inattentive to right.     Assessment/Plan: 1. 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 Physiatrist is providing close team supervision and 24 hour management of active medical problems listed below. Physiatrist and rehab team continue to assess barriers to discharge/monitor patient progress toward functional and medical goals.  Function:  Bathing Bathing position   Position: Shower  Bathing parts Body parts bathed by patient: Chest, Abdomen, Right arm, Right upper leg, Left upper leg, Front perineal area Body parts bathed by helper: Left arm, Right lower leg, Left lower leg, Back  Bathing assist Assist Level:  (Mod assist)      Upper Body Dressing/Undressing Upper body dressing   What is the patient wearing?: Pull over shirt/dress     Pull over shirt/dress - Perfomed by patient: Thread/unthread left sleeve, Put head through opening, Pull shirt over trunk Pull over shirt/dress - Perfomed by helper: Thread/unthread right sleeve  Upper body assist Assist Level: Touching or steadying assistance(Pt > 75%)      Lower Body Dressing/Undressing Lower body dressing   What is the patient wearing?: Pants, Non-skid slipper socks     Pants- Performed by patient: Thread/unthread left pants leg Pants- Performed by helper: Thread/unthread right pants  leg, Pull pants up/down Non-skid slipper socks- Performed by patient: Don/doff right sock Non-skid slipper socks- Performed by helper: Don/doff right sock, Don/doff left sock Socks - Performed by patient: Don/doff right sock Socks - Performed by helper: Don/doff left sock Shoes - Performed by patient: Don/doff left shoe Shoes - Performed by helper: Don/doff right shoe, Fasten right, Fasten left          Lower body assist Assist for lower body dressing:  (Max assist)      Naval architect activity did not occur: No continent bowel/bladder event        Toileting assist     Transfers Chair/bed transfer   Chair/bed transfer method: Squat pivot Chair/bed transfer assist level: Moderate assist (Pt 50 - 74%/lift or lower) Chair/bed transfer assistive device: Armrests     Locomotion Ambulation Ambulation activity did not occur: Safety/medical concerns   Max distance: 30 Assist level: 2 helpers   Oceanographer activity did not occur: Safety/medical concerns (pt in tilt-in-space w/c, unable to propel) Type: Manual Max wheelchair distance: 100 Assist Level: Touching or steadying assistance (Pt > 75%)  Cognition Comprehension Comprehension assist level: Understands basic 50 - 74% of the time/ requires cueing 25 - 49% of the time  Expression Expression assist level: Expresses basis less than 25% of the time/requires cueing >75% of the time.  Social Interaction Social Interaction assist level: Interacts appropriately 50 - 74% of the time - May be physically or verbally inappropriate.  Problem Solving Problem solving assist level: Solves basic 25 - 49% of the time - needs direction more than half the time to initiate, plan or complete simple activities  Memory Memory assist level: Recognizes or recalls 25 - 49% of the time/requires cueing 50 - 75% of the time   Medical Problem List and Plan: 1. Functional deficits secondary to Left MCA infarct, Right hemiplegia,  apraxia, aphasia  -continue CIR therapies 2. DVT Prophylaxis/Anticoagulation: Pharmaceutical: Lovenox 3. Chronic DDD/Fibromyalgia/Pain Management: Used oxycodone prn at home per family. Continue tylenol prn for now  -appears comfortable at present 4. Mood: LCSW to follow for evaluation and support as mentation improves.  5. Neuropsych: This patient is not capable of making decisions on her own behalf. 6. Skin/Wound Care: Routine pressure relief measures  -ocean nasal spray for dry nasal mucosa 7. Fluids/Electrolytes/Nutrition:eating quite well with supervision, By mouth intake remains variable, fluid intake poor at around 300 mL per day, intake 25-40% of meals  -continue H20 flushes through PEG.Will increase volume, monitor for fluid overload 8. HTN: Monitor BP bid. On Lisinopril, hydralazine and catapres. reduce amlodipine to 5 mg, reduce Catapres to 0.1 mg, BP 122/57 this am 9. DM type 2: Family reports non-compliance with insulin at home. Will monitor BS ac/hs. Continue lantus bid with glipizide BID. Use SSI for elevated BS  -good control at present.cbg's reviewed CBG, well controlled at present,98 this am, monitor hypoglycemia 10. Anxiety disorder: Used Paxil and ativan at home. Monitor as awareness improves.   -resumed low dose paxil--follow up with family about home dose 11. Acute pre-renal azotemia: increased water flushes to qid. BUN stable at 23.  12. Hx VDRF:trach site well healed  --continues to be comfortable  and managing airway well at present  -high aspiration risk, no current evidence of asp PNA  -full supervision with meals, D1 honeyEncourage intake 13. Thrush: 2 days of diflucan 14. Constipation did have incontinent BM x 3 on 12/6, small amounts will adjust laxatives 15. Urinary retention which is intermittent. Patient will need to try voiding on a toilet with assistance  16.  severe dysphasia status post stroke, Continue honey thick liquids, monitor for  aspiration LOS (Days) 14 A FACE TO FACE EVALUATION WAS PERFORMED  Kerry [name] E 05/30/2015 9:01 AM

## 2015-05-30 NOTE — Progress Notes (Signed)
Physical Therapy Session Note  Patient Details  Name: Kerry [name] MRN: [id number] Date of Birth: March 26, 1956  Today's Date: 05/30/2015 PT Individual Time: 1030-1045 and 1130-1200 PT Individual Time Calculation (min): 15 min and 30 min (45 min total)   Short Term Goals: Week 2:  PT Short Term Goal 1 (Week 2): Pt will perform sit <>supine minA with HOB flat to simulate home environment PT Short Term Goal 2 (Week 2): Pt will perform squat pivot transfer with consistent modA +1 PT Short Term Goal 3 (Week 2): Pt will perform gait with LRAD x30' modA +1 and w/c follow for safety  PT Short Term Goal 4 (Week 2): Pt will perform ascent/descent three 3-inch height stairs with maxA +1  Skilled Therapeutic Interventions/Progress Updates:    Tx 1: Pt received seated in w/c, sister Manuela Schwartz and RN present giving medication. Per nursing and family report, pt has been upset, appears uncomfortable, in pain and motioning to go back to bed. Transfer w/c >bed modA. Sit>supine minA to A with RLE into bed. Repositioning in bed using bedrails and min verbal cues, minA. Rolling R/L to check brief for incontinent episode, however none noted. Educated daughter in RLE PROM to gastroc/soleus and educated on foot drop, progress in therapy so far, d/c plan home with family and family education and hands-on training to occur next week. Pt remained supine in bed with all needs in reach at completion of session, alarm intact and sister present.   Tx2: Pt received supine in bed, no evidence of pain and agreeable to treatment. Pt indicating she needed something, stated "dirty". Performed rolling R/L in bed with minA/CGA and verbal cues to A with changing brief and hygiene totalA. Supine>sit modA from flat surface. Squat pivot transfer bed>w/c modA. W/c propulsion x50' with LUE/LLE with minA for navigation. Sit <>stand at sink and performed hair brushing with LUE, maxA for standing balance, facilitation at R quads for stance  control. Remained seated in w/c with husband present at completion of session, all needs in reach; requested pt's husband alert nursing or don quick release belt if leaving the pt alone.   Therapy Documentation Precautions:  Precautions Precautions: Fall Precaution Comments: Peg, Rt shoulder subluxation Restrictions Weight Bearing Restrictions: No General: PT Amount of Missed Time (min): 15 Minutes PT Missed Treatment Reason: Patient fatigue;Pain Pain: Pain Assessment Pain Assessment: Faces Pain Score: Asleep Faces Pain Scale: Hurts little more   See Function Navigator for Current Functional Status.   Therapy/Group: Individual Therapy  Luberta Mutter 05/30/2015, 10:49 AM

## 2015-05-30 NOTE — Progress Notes (Signed)
SLP Cancellation Note  Patient Details Name: Kerry [name] MRN: [id number] DOB: 1956-04-12   Cancelled treatment:        Patient missed 60 minutes of skilled SLP therapy due to vomiting earlier and was continuing to decline due to feeling ill and wanting to rest.                                                                                                Gunnar Fusi, M.A., CCC-SLP [place] 05/30/2015, 4:26 PM

## 2015-05-30 NOTE — Progress Notes (Signed)
Occupational Therapy Session Note  Patient Details  Name: Kerry [name] MRN: [id number] Date of Birth: [birth year]  Today's Date: 05/30/2015 OT Individual Time: [phone] and 1305-1340 OT Individual Time Calculation (min): 60 min and 35 min   Short Term Goals: Week 2:  OT Short Term Goal 1 (Week 2): Pt will complete bathing with min assist at sit > stand level OT Short Term Goal 2 (Week 2): Pt will complete LB dressing with mod assist at sit > stand level OT Short Term Goal 3 (Week 2): Pt will complete toilet transfer to regular commode with mod assist of 1 caregiver OT Short Term Goal 4 (Week 2): Pt will complete 1 grooming task in standing with min assist for standing balance  Skilled Therapeutic Interventions/Progress Updates:    1) Treatment session with focus on trunk control, sequencing, sit > stand, Rt attention, and attention to task.  Dressing task completed from w/c level with pt requiring cues for hemi-dressing technique to don RUE first then completing UB dressing.  Pt able to thread both pant legs this session with therapist propping and maintaining RLE crossed over Lt knee and then pt reaching forward to thread Lt pant leg.  Sit > stand with mod assist and then pt able to pull pants over Lt hip, requiring assist to pull over Rt hip.  Engaged in oral care with suction brush due to excess of eggs in mouth.  Engaged in table top jigsaw puzzle with focus on scanning to Rt, attention to task, and sequencing, with pt demonstrating difficulty with outermost part of picture but improved with interior detail of picture.    2) Treatment session with focus on sitting balance and attention to task.  Pt engaged in matching task seated at EOB with pt able to sort shapes with only initial cue.  Utilized Hotel manager with focus on spelling pt's name and children's names.  Pt able to sort letters to spell her name without cue, requiring mod-max cues to sort to spell her son's names.  Pt  returned to bed at end of session with mod assist to position.  Pt began coughing and appeared to be struggling to breathe, therefore sat up in bed supported by therapist and pt began to vomit.  Notified RN of pt current status with pt vomiting 3 more times.  Left upright with RN and nurse tech to assess and change clothing and bedding.  Therapy Documentation Precautions:  Precautions Precautions: Fall Precaution Comments: Peg, Rt shoulder subluxation Restrictions Weight Bearing Restrictions: No General:   Vital Signs: Therapy Vitals Temp: 97.7 F (36.5 C) Temp Source: Oral Pulse Rate: 69 Resp: (!) 21 BP: (!) 122/57 mmHg Patient Position (if appropriate): Lying Oxygen Therapy SpO2: 99 % O2 Device: Not Delivered Pain: Pain Assessment Pain Assessment: Faces Faces Pain Scale: No hurt  See Function Navigator for Current Functional Status.   Therapy/Group: Individual Therapy  Simonne Come 05/30/2015, 9:52 AM

## 2015-05-31 ENCOUNTER — Inpatient Hospital Stay (HOSPITAL_COMMUNITY): Payer: 59 | Admitting: Speech Pathology

## 2015-05-31 ENCOUNTER — Inpatient Hospital Stay (HOSPITAL_COMMUNITY): Payer: 59 | Admitting: Physical Therapy

## 2015-05-31 ENCOUNTER — Inpatient Hospital Stay (HOSPITAL_COMMUNITY): Payer: 59 | Admitting: Occupational Therapy

## 2015-05-31 ENCOUNTER — Ambulatory Visit (HOSPITAL_COMMUNITY): Payer: 59 | Admitting: Occupational Therapy

## 2015-05-31 LAB — GLUCOSE, CAPILLARY
GLUCOSE-CAPILLARY: 72 mg/dL (ref 65–99)
GLUCOSE-CAPILLARY: 107 mg/dL — AB (ref 65–99)
GLUCOSE-CAPILLARY: 77 mg/dL (ref 65–99)
Glucose-Capillary: 79 mg/dL (ref 65–99)

## 2015-05-31 NOTE — Progress Notes (Signed)
Occupational Therapy Session Note  Patient Details  Name: Kerry [name] MRN: [id number] Date of Birth: 1955/09/07  Today's Date: 05/31/2015 OT Individual Time: 0930-1010 OT Individual Time Calculation (min): 40 min    Short Term Goals: Week 2:  OT Short Term Goal 1 (Week 2): Pt will complete bathing with min assist at sit > stand level OT Short Term Goal 2 (Week 2): Pt will complete LB dressing with mod assist at sit > stand level OT Short Term Goal 3 (Week 2): Pt will complete toilet transfer to regular commode with mod assist of 1 caregiver OT Short Term Goal 4 (Week 2): Pt will complete 1 grooming task in standing with min assist for standing balance  Skilled Therapeutic Interventions/Progress Updates:    ADL retraining with focus on Rt attention, trunk control, sit > stand, and dynamic standing balance.  Pt received upright in w/c, therefore completed bathing and dressing from w/c level at sink.  Pt with increased sequencing with bathing and initiating bathing RUE without cues from therapist, however when progressing to wash BLE pt perseverated on washing face requiring hand over hand assist to initiate washing legs.  Pt donned pants with therapist supporting LLE to reach foot and then able to don RLE without assist.  Pt gesturing no when asked if needed to wash perineal area.  Utilized family pictures to focus on naming family members, with pt perseverating on her son's name "[name]" with no awareness of incorrect response when cued for correct names, with pt continuing to state "[name]".   Therapy Documentation Precautions:  Precautions Precautions: Fall Precaution Comments: Peg, Rt shoulder subluxation Restrictions Weight Bearing Restrictions: No General:   Vital Signs: Therapy Vitals BP: (!) 170/76 mmHg Pain:  Pt with no c/o pain  See Function Navigator for Current Functional Status.   Therapy/Group: Individual Therapy  Kerry [name] 05/31/2015, 10:52 AM

## 2015-05-31 NOTE — Progress Notes (Signed)
Occupational Therapy Session Note  Patient Details  Name: SIVI GOODBAR MRN: [id number] Date of Birth: March 10, 1956  Today's Date: 05/31/2015 OT Individual Time: 1345-1430 OT Individual Time Calculation (min): 45 min    Short Term Goals: Week 2:  OT Short Term Goal 1 (Week 2): Pt will complete bathing with min assist at sit > stand level OT Short Term Goal 2 (Week 2): Pt will complete LB dressing with mod assist at sit > stand level OT Short Term Goal 3 (Week 2): Pt will complete toilet transfer to regular commode with mod assist of 1 caregiver OT Short Term Goal 4 (Week 2): Pt will complete 1 grooming task in standing with min assist for standing balance  Skilled Therapeutic Interventions/Progress Updates:    1:1 Focus on functional transfer training from bed/mat<>w/c with mod A with max VC for setup.  Pt transitioned down to the gym from room to also address sit to stan/ standing endurance, muscle activity in glut and quad for hip and knee terminal extension. Pt able to activate glut but continues to demonstrate mm impersistance. Also focus on weight shifts toward and away from weak side addressing balance. Transitioned to the mat and performed sit to sidelying with mod A. Focus on right Scapular protraction and retraction.  Pt able to perform slight protraction but total A with retraction.  Nothing detected distally. Left pt on mat in prep for next therapy.   Therapy Documentation Precautions:  Precautions Precautions: Fall Precaution Comments: Peg, Rt shoulder subluxation Restrictions Weight Bearing Restrictions: No Pain: Pain Assessment Pain Assessment: Faces Faces Pain Scale: No hurt  See Function Navigator for Current Functional Status.   Therapy/Group: Individual Therapy  [name] [facility] 05/31/2015, 3:36 PM

## 2015-05-31 NOTE — Progress Notes (Signed)
Speech Language Pathology Weekly Progress and Session Note  Patient Details  Name: Kerry [name] MRN: [id number] Date of Birth: 02-19-1956  Beginning of progress report period:  May 24, 2015  End of progress report period: May 31, 2015  Today's Date: 05/31/2015 SLP Individual Time: 0800-0900 SLP Individual Time Calculation (min): 60 min  Short Term Goals: Week 2: SLP Short Term Goal 1 (Week 2): Patient will consume Dys.1 textures and honey thick liquids with Min tactile cues for pacing and portion control to minimize overt s/s of aspiration over three consecutive sessions prior to initiation of nectar thick liquids trials.  SLP Short Term Goal 1 - Progress (Week 2): Met SLP Short Term Goal 2 (Week 2): Patient will vocalize in 25% of opportunities with Max assit multimodal cues   SLP Short Term Goal 2 - Progress (Week 2): Met SLP Short Term Goal 3 (Week 2): Patient will follow 1-step directions during familiar tasks with Max assist gesture cues SLP Short Term Goal 3 - Progress (Week 2): Met SLP Short Term Goal 4 (Week 2): Pt will match object to phrase from a field of three with max assist visual cues.   SLP Short Term Goal 4 - Progress (Week 2): Met    New Short Term Goals: Week 3: SLP Short Term Goal 1 (Week 3): Pt will consume trials of dys 2 textures with min verbal cues for rate and portion control over 2 consecutive sessions prior to advancement  SLP Short Term Goal 2 (Week 3): Patient will vocalize in 50% of opportunities with Max assit multimodal cues   SLP Short Term Goal 3 (Week 3): Patient will follow 1-step directions during familiar tasks with Mod assist gesture cues SLP Short Term Goal 4 (Week 3): Pt will match object to phrase from a field of three with mod assist visual cues.   SLP Short Term Goal 5 (Week 3): Pt will consume trials of therapeutic ice chips with minimal overt s/s of aspiration and supervision cues for use of swallowing precautions over 3  targeted sessions.    Weekly Progress Updates:  Pt made functional gains and is discharging having met 4 out of 4 short term goals.  Pt is consuming dys 1 textures and honey thick liquids with full supervision for use of swallowing precautions with minimal overt s/s of aspiration. Pt currently requires max assist for initiation of vocalization and to verbally express her needs/wants to caregivers.  She has demonstrated improvements in her receptive language and can follow 1-step commands with max assist.  Pt would continue to benefit from skilled ST while inpatient in order to maximize functional independence for cognitive-linguistic and swallowing function and reduce burden of care prior to discharge.  Continue to anticipate that pt will need close 24/7 supervision in addition to ST follow up at next level of care.      Intensity: Minumum of 1-2 x/day, 30 to 90 minutes Frequency: 3 to 5 out of 7 days Duration/Length of Stay: 19-22 days  Treatment/Interventions: Cognitive remediation/compensation;Cueing hierarchy;Dysphagia/aspiration precaution training;Environmental controls;Functional tasks;Internal/external aids;Patient/family education;Multimodal communication approach;Speech/Language facilitation;Therapeutic Activities   Daily Session  Skilled Therapeutic Interventions: Pt was seen for skilled ST targeting goals for dysphagia and communication.  Pt had had an incontinent bowel movement when initiating transfer from bed to wheelchair via [place].  Pt was able to follow 1-step commands during transfer to toilet to complete bowel movement and hand hygiene following toileting with max assist verbal cues. Pt consumed dys 1 textures and honey thick  liquids with x1 min assist verbal cue for rate and portion control, which improved to supervision level assist as meal progressed.  SLP facilitated the session with a trial meal tray of dys 2 textures to continue working towards diet progression.  Pt  demonstrated effective mastication and clearance of solids from the oral cavity post swallow but trials were limited due to pt's refusal of further trials.  SLP will continue to follow up for trials of advanced textures.  Pt was perseverative on the word "[name]" today and required max assist verbal cues for to monitor and correct verbal errors.  Pt was able to complete automatic sequences in a familiar song with mod assist verbal and visual cues for accurate production of words.  Pt was left in wheelchair with quick release belt donned and call bell left within reach.  Goals updated on this date to reflect current progress and plan of care.        Function:   Eating Eating   Modified Consistency Diet: Yes Eating Assist Level: Supervision or verbal cues;Set up assist for   Eating Set Up Assist For: Opening containers       Cognition Comprehension Comprehension assist level: Understands basic 50 - 74% of the time/ requires cueing 25 - 49% of the time  Expression   Expression assist level: Expresses basic 25 - 49% of the time/requires cueing 50 - 75% of the time. Uses single words/gestures.  Social Interaction Social Interaction assist level: Interacts appropriately 50 - 74% of the time - May be physically or verbally inappropriate.  Problem Solving Problem solving assist level: Solves basic 25 - 49% of the time - needs direction more than half the time to initiate, plan or complete simple activities  Memory Memory assist level: Recognizes or recalls 25 - 49% of the time/requires cueing 50 - 75% of the time   General    Pain Pain Assessment Pain Assessment: No/denies pain Faces Pain Scale: No hurt  Therapy/Group: Individual Therapy  Keirsten Matuska, Selinda Orion 05/31/2015, 4:00 PM

## 2015-05-31 NOTE — Progress Notes (Signed)
[place] PHYSICAL MEDICINE & REHABILITATION     PROGRESS NOTE    Subjective/Complaints: aphasic and apraxic, continues to vocalize during exhalations no respiratory distress Patient is awake alert attempts to follow commands but has apraxia Urine reported as malodorous  Unable to perform ROS due to aphasia  Objective: Vital Signs: Blood pressure 170/76, pulse 64, temperature 97.8 F (36.6 C), temperature source Oral, resp. rate 17, weight 82.9 kg (182 lb 12.2 oz), SpO2 99 %. No results found. No results for input(s): WBC, HGB, HCT, PLT in the last 72 hours. No results for input(s): NA, K, CL, GLUCOSE, BUN, CREATININE, CALCIUM in the last 72 hours.  Invalid input(s): CO CBG (last 3)   Recent Labs  05/30/15 1226 05/30/15 1648 05/30/15 2111  GLUCAP 130* 81 101*    Wt Readings from Last 3 Encounters:  05/23/15 82.9 kg (182 lb 12.2 oz)  05/16/15 86.183 kg (190 lb)  04/27/15 92.5 kg (203 lb 14.8 oz)    Physical Exam:  Constitutional: She appears well-developed and well-nourished. No distress.  Sitting up in bed and comfortable  HENT: Trach site is clean no evidence of a fairly Head: Normocephalic and atraumatic.  Mouth/Throat: Oropharynx is   Moist still with white film on tongue   Missing upper teeth and multiple lower teeth.  Eyes: Conjunctivae and EOM are normal. Pupils are equal, round, and reactive to light.  Neck: Normal range of motion. Neck supple.  Cardiovascular: Normal rate and regular rhythm. Exam reveals no friction rub.  Respiratory: Effort normal. No respiratory distress. She makes stridorous sounds when breathing GI: Soft. Bowel sounds are normal. She exhibits no distension. There is no tenderness.  PEG site clean and dry without drainage.  Musculoskeletal: She exhibits no edema or tenderness.  Neurological: She is alert.    Apraxia noted. occasionally with an appropriate y/n nod. Poor oral-motor control Inconsistently follows commands    Lacks insight and awareness of deficits.  Unable to perform motor and sensory testing, Grossly 4/5 in LUE and LLE.No active movement right upper or right lower extremity Withdraws to pinch in the left upper and left lower but not in the right upper and right lower DTRs 3+ on right side    Skin: Skin is warm and dry. No rash noted. She is not diaphoretic. No erythema.  Psychiatric: She is not slowed and not withdrawn. Cognition and memory are impaired. She is noncommunicative. She is inattentive to right.     Assessment/Plan: 1. 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 Physiatrist is providing close team supervision and 24 hour management of active medical problems listed below. Physiatrist and rehab team continue to assess barriers to discharge/monitor patient progress toward functional and medical goals.  Function:  Bathing Bathing position   Position: Shower  Bathing parts Body parts bathed by patient: Chest, Abdomen, Right arm, Right upper leg, Left upper leg, Front perineal area Body parts bathed by helper: Left arm, Right lower leg, Left lower leg, Back  Bathing assist Assist Level:  (Mod assist)      Upper Body Dressing/Undressing Upper body dressing   What is the patient wearing?: Pull over shirt/dress     Pull over shirt/dress - Perfomed by patient: Thread/unthread left sleeve, Put head through opening, Pull shirt over trunk Pull over shirt/dress - Perfomed by helper: Thread/unthread right sleeve        Upper body assist Assist Level: Touching  or steadying assistance(Pt > 75%)      Lower Body Dressing/Undressing Lower body dressing   What is the patient wearing?: Pants, Shoes     Pants- Performed by patient: Thread/unthread left pants leg, Thread/unthread right pants leg Pants- Performed by helper: Pull pants up/down Non-skid slipper  socks- Performed by patient: Don/doff right sock Non-skid slipper socks- Performed by helper: Don/doff right sock, Don/doff left sock Socks - Performed by patient: Don/doff right sock Socks - Performed by helper: Don/doff left sock Shoes - Performed by patient: Don/doff left shoe Shoes - Performed by helper: Don/doff right shoe, Don/doff left shoe          Lower body assist Assist for lower body dressing:  (Max assist)      Toileting Toileting Toileting activity did not occur: No continent bowel/bladder event        Toileting assist     Transfers Chair/bed transfer   Chair/bed transfer method: Squat pivot Chair/bed transfer assist level: Moderate assist (Pt 50 - 74%/lift or lower) Chair/bed transfer assistive device: Armrests     Locomotion Ambulation Ambulation activity did not occur: Safety/medical concerns   Max distance: 30 Assist level: 2 helpers   Oceanographer activity did not occur: Safety/medical concerns (pt in tilt-in-space w/c, unable to propel) Type: Manual Max wheelchair distance: 100 Assist Level: Touching or steadying assistance (Pt > 75%)  Cognition Comprehension Comprehension assist level: Understands basic 50 - 74% of the time/ requires cueing 25 - 49% of the time  Expression Expression assist level: Expresses basis less than 25% of the time/requires cueing >75% of the time.  Social Interaction Social Interaction assist level: Interacts appropriately 50 - 74% of the time - May be physically or verbally inappropriate.  Problem Solving Problem solving assist level: Solves basic 25 - 49% of the time - needs direction more than half the time to initiate, plan or complete simple activities  Memory Memory assist level: Recognizes or recalls 25 - 49% of the time/requires cueing 50 - 75% of the time   Medical Problem List and Plan: 1. Functional deficits secondary to Left MCA infarct, Right hemiplegia, apraxia, aphasia  -continue CIR therapies 2. DVT  Prophylaxis/Anticoagulation: Pharmaceutical: Lovenox 3. Chronic DDD/Fibromyalgia/Pain Management: Used oxycodone prn at home per family. Continue tylenol prn for now  -appears comfortable at present 4. Mood: LCSW to follow for evaluation and support as mentation improves.  5. Neuropsych: This patient is not capable of making decisions on her own behalf. 6. Skin/Wound Care: Routine pressure relief measures  -ocean nasal spray for dry nasal mucosa 7. Fluids/Electrolytes/Nutrition:eating quite well with supervision, By mouth intake remains variable, fluid intake poor at around 300 mL per day, intake 25-40% of meals  -continue H20 flushes through PEG.Will increase volume, monitor for fluid overload 8. HTN: Monitor BP bid. On Lisinopril, hydralazine and catapres. reduce amlodipine to 5 mg, reduce Catapres to 0.1 mg, BP 122/57 this am 9. DM type 2: Family reports non-compliance with insulin at home. Will monitor BS ac/hs. Continue lantus bid with glipizide BID. Use SSI for elevated BS  -good control at present.cbg's reviewed CBG, well controlled at present,101 this am, monitor hypoglycemia 10. Anxiety disorder: Used Paxil and ativan at home. Monitor as awareness improves.   -resumed low dose paxil--follow up with family about home dose 11. Acute pre-renal azotemia: increased water flushes to qid. BUN stable at 23.  12. Hx VDRF:trach site well healed  --continues to be comfortable and managing airway well at present  -high aspiration  risk, no current evidence of asp PNA  -full supervision with meals, D1 honeyEncourage intake  14. Constipation did have incontinent BM x 3 on 12/6, small amounts will adjust laxatives 15. Urinary retention which is intermittent. Patient will need to try voiding on a toilet with assistance, given malodorous urine and need for catheterization check UA  16.  severe dysphagia status post stroke, Continue honey thick liquids, monitor for aspiration, Supplemental water  boluses, LOS (Days) 15 A FACE TO FACE EVALUATION WAS PERFORMED  KIRSTEINS,ANDREW E 05/31/2015 8:19 AM

## 2015-05-31 NOTE — Progress Notes (Signed)
Physical Therapy Session Note  Patient Details  Name: Kerry [name] MRN: [id number] Date of Birth: 1956/02/24  Today's Date: 05/31/2015 PT Individual Time: 1430-1530 PT Individual Time Calculation (min): 60 min   Short Term Goals: Week 2:  PT Short Term Goal 1 (Week 2): Pt will perform sit <>supine minA with HOB flat to simulate home environment PT Short Term Goal 2 (Week 2): Pt will perform squat pivot transfer with consistent modA +1 PT Short Term Goal 3 (Week 2): Pt will perform gait with LRAD x30' modA +1 and w/c follow for safety  PT Short Term Goal 4 (Week 2): Pt will perform ascent/descent three 3-inch height stairs with maxA +1  Skilled Therapeutic Interventions/Progress Updates:    Pt received supine in mat table with handoff from OT; no evidence of pain via FACES scale and agreeable to treatment. Performed progressive part>whole bed mobility beginning with seated>R elbow support>seated, gradually progressing seated>supine. Verbal cues for LUE assist to mobilize RUE, as well as cues for RLE to A with LLE movement onto and off of table. Transfer training to R/L sides from mat table <>w/c x4 trials. Demonstrated and explained head/hips relationship due to difficulty with small scoots in w/c for positioning. Gait with L rail x30' for 1 trial, ace wrap dorsiflexion assist, totalA for RLE progression and variable mod/maxA for RLE stance control. Nustep x8 min total (3 short rest breaks) for RLE NMR. Required maxA initially to facilitate reciprocal movements, abduction assist to maintain neutral alignment. After several minutes pt able to perform reciprocal stepping without assist x2 min continuously. Returned to room, squat pivot w/c >bed with modA. Sit>supine minA. Remained supine in bed with all needs in reach at completion of session.    Therapy Documentation Precautions:  Precautions Precautions: Fall Precaution Comments: Peg, Rt shoulder subluxation Restrictions Weight Bearing  Restrictions: No Pain: Pain Assessment Pain Assessment: Faces Faces Pain Scale: No hurt   See Function Navigator for Current Functional Status.   Therapy/Group: Individual Therapy  Luberta Mutter 05/31/2015, 3:35 PM

## 2015-06-01 ENCOUNTER — Inpatient Hospital Stay (HOSPITAL_COMMUNITY): Payer: 59 | Admitting: Physical Therapy

## 2015-06-01 ENCOUNTER — Inpatient Hospital Stay (HOSPITAL_COMMUNITY): Payer: 59 | Admitting: Occupational Therapy

## 2015-06-01 ENCOUNTER — Inpatient Hospital Stay (HOSPITAL_COMMUNITY): Payer: 59 | Admitting: Speech Pathology

## 2015-06-01 LAB — URINE CULTURE

## 2015-06-01 LAB — GLUCOSE, CAPILLARY
GLUCOSE-CAPILLARY: 98 mg/dL (ref 65–99)
Glucose-Capillary: 88 mg/dL (ref 65–99)

## 2015-06-01 MED ORDER — PRO-STAT SUGAR FREE PO LIQD
30.0000 mL | Freq: Three times a day (TID) | ORAL | Status: DC
  Administered 2015-06-01 – 2015-06-07 (×15): 30 mL via ORAL
  Filled 2015-06-01 (×15): qty 30

## 2015-06-01 NOTE — Progress Notes (Signed)
Occupational Therapy Session Note  Patient Details  Name: Kerry [name] MRN: [id number] Date of Birth: 1955-11-17  Today's Date: 06/01/2015 OT Individual Time:  -    1100-1200  (60 min)  1st session                                            1500-1530  (30 min)  2nd session      Short Term Goals: Week 1:  OT Short Term Goal 1 (Week 1): Pt will maintain static sitting balance at EOB in preparation for self-care tasks OT Short Term Goal 1 - Progress (Week 1): Met OT Short Term Goal 2 (Week 1): Pt will complete bathing with mod assist and min cues for sequencing OT Short Term Goal 2 - Progress (Week 1): Met OT Short Term Goal 3 (Week 1): Pt will complete UB dressing wiht mod assist and min cues for sequencing OT Short Term Goal 3 - Progress (Week 1): Met OT Short Term Goal 4 (Week 1): Pt will complete toilet transfer to drop arm BSC with mod assist of 1 caregiver OT Short Term Goal 4 - Progress (Week 1): Met OT Short Term Goal 5 (Week 1): Pt will position RUE in preparation for transfers with min cues OT Short Term Goal 5 - Progress (Week 1): Met Week 2:  OT Short Term Goal 1 (Week 2): Pt will complete bathing with min assist at sit > stand level OT Short Term Goal 2 (Week 2): Pt will complete LB dressing with mod assist at sit > stand level OT Short Term Goal 3 (Week 2): Pt will complete toilet transfer to regular commode with mod assist of 1 caregiver OT Short Term Goal 4 (Week 2): Pt will complete 1 grooming task in standing with min assist for standing balance  Skilled Therapeutic Interventions/Progress Updates:  1st session:  Focus of treatment was bed mobility, transfers,  Neuro-muscular reeducation, sitting balance, standing balance, attention, therapeutic activities, sustained attention, postural control.  Pt. Lying in bed.  Addressed rolling from side to EOB with max to mod assist.  Pt. Sat EOB for 2 minutes with no LOB in static;  Needed mod assist with minimally challenging  dynamic reaching.  Transferred from EOB To WC with Stand pivot transfer +2.  Transferred from wc to toilet with 3n1 over it with +2.  Went to sink and stood x1.  Pt seemed to be in pain.  Transferred to bed +2.  Pt was nauseated and vomited am pills.  Informed RN.  Husband, [name] in room during treatment.   Time:1500-1530  (30 min) Pain:none noted   2nd session:  Individual session: Pt. Lying in bed.  Assist pt from supine to sit.  Sat EOB and engaged in [place] card game  for 15 minutes with no LOB.  Pt. Unable to to point to the picture, but did organize the cards and put game cards back in box.  Provided WB ing through RUE during session with support at wrist and faciliatation at elbow and scapula.  Returned pt to supine with all needs in reach.       Therapy Documentation Precautions:  Precautions Precautions: Fall Precaution Comments: Peg, Rt shoulder subluxation Restrictions Weight Bearing Restrictions: No    Vital Signs: Therapy Vitals Temp: 98 F (36.7 C) Temp Source: Oral Pulse Rate: 74 Resp: 18 BP: 118/63 mmHg Patient Position (  if appropriate): Lying Oxygen Therapy SpO2: 98 % O2 Device: Not Delivered Pain:  Back Pain no number given          See Function Navigator for Current Functional Status.   Therapy/Group: Individual Therapy  Lisa Roca 06/01/2015, 8:25 AM

## 2015-06-01 NOTE — Progress Notes (Signed)
[place] PHYSICAL MEDICINE & REHABILITATION     PROGRESS NOTE    Subjective/Complaints: No apparent distress, vocalizing more, occ words  Unable to perform ROS due to aphasia  Objective: Vital Signs: Blood pressure 118/63, pulse 74, temperature 98 F (36.7 C), temperature source Oral, resp. rate 18, weight 82.9 kg (182 lb 12.2 oz), SpO2 98 %. No results found. No results for input(s): WBC, HGB, HCT, PLT in the last 72 hours. No results for input(s): NA, K, CL, GLUCOSE, BUN, CREATININE, CALCIUM in the last 72 hours.  Invalid input(s): CO CBG (last 3)   Recent Labs  05/31/15 1623 05/31/15 2112 06/01/15 0631  GLUCAP 77 79 98    Wt Readings from Last 3 Encounters:  05/23/15 82.9 kg (182 lb 12.2 oz)  05/16/15 86.183 kg (190 lb)  04/27/15 92.5 kg (203 lb 14.8 oz)    Physical Exam:  Constitutional: She appears well-developed and well-nourished. No distress.  Sitting up in bed and comfortable  HENT: Trach site is clean no evidence of a fairly Head: Normocephalic and atraumatic.  Mouth/Throat: Oropharynx is   Moist still with white film on tongue   Missing upper teeth and multiple lower teeth.  Eyes: Conjunctivae and EOM are normal. Pupils are equal, round, and reactive to light.  Neck: Normal range of motion. Neck supple.  Cardiovascular: Normal rate and regular rhythm. Exam reveals no friction rub.  Respiratory: Effort normal. No respiratory distress. She makes stridorous sounds when breathing GI: Soft. Bowel sounds are normal. She exhibits no distension. There is no tenderness.  PEG site clean and dry without drainage.  Musculoskeletal: She exhibits no edema or tenderness.  Neurological: She is alert.    Apraxia noted. occasionally with an appropriate y/n nod. Poor oral-motor control Inconsistently follows commands   Lacks insight and awareness of deficits.  Unable to perform motor and sensory testing, Grossly 4/5 in LUE and LLE.No active movement right  upper or right lower extremity Withdraws to pinch in the left upper and left lower but not in the right upper and right lower DTRs 3+ on right side    Skin: Skin is warm and dry. No rash noted. She is not diaphoretic. No erythema.  Psychiatric: She is not slowed and not withdrawn. Cognition and memory are impaired. She is noncommunicative. She is inattentive to right.     Assessment/Plan: 1. 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 Physiatrist is providing close team supervision and 24 hour management of active medical problems listed below. Physiatrist and rehab team continue to assess barriers to discharge/monitor patient progress toward functional and medical goals.  Function:  Bathing Bathing position   Position: Wheelchair/chair at sink  Bathing parts Body parts bathed by patient: Right arm, Chest, Abdomen, Right upper leg, Left upper leg Body parts bathed by helper: Left arm, Right lower leg, Left lower leg, Back  Bathing assist Assist Level:  (Mod assist, pt declined washing buttocks)      Upper Body Dressing/Undressing Upper body dressing   What is the patient wearing?: Pull over shirt/dress     Pull over shirt/dress - Perfomed by patient: Thread/unthread left sleeve, Put head through opening, Pull shirt over trunk Pull over shirt/dress - Perfomed by helper: Thread/unthread right sleeve        Upper body assist Assist Level: Touching or steadying assistance(Pt > 75%)      Lower Body Dressing/Undressing Lower body dressing  What is the patient wearing?: Pants, Shoes     Pants- Performed by patient: Thread/unthread left pants leg, Thread/unthread right pants leg Pants- Performed by helper: Pull pants up/down Non-skid slipper socks- Performed by patient: Don/doff right sock Non-skid slipper socks- Performed by helper: Don/doff right sock,  Don/doff left sock Socks - Performed by patient: Don/doff right sock Socks - Performed by helper: Don/doff left sock Shoes - Performed by patient: Don/doff left shoe Shoes - Performed by helper: Don/doff right shoe, Don/doff left shoe          Lower body assist Assist for lower body dressing:  (Max assist)      Toileting Toileting Toileting activity did not occur: No continent bowel/bladder event        Toileting assist     Transfers Chair/bed transfer   Chair/bed transfer method: Squat pivot Chair/bed transfer assist level: Moderate assist (Pt 50 - 74%/lift or lower) Chair/bed transfer assistive device: Armrests     Locomotion Ambulation Ambulation activity did not occur: Safety/medical concerns   Max distance: 30 Assist level: 2 helpers   Oceanographer activity did not occur: Safety/medical concerns (pt in tilt-in-space w/c, unable to propel) Type: Manual Max wheelchair distance: 100 Assist Level: Touching or steadying assistance (Pt > 75%)  Cognition Comprehension Comprehension assist level: Understands basic 50 - 74% of the time/ requires cueing 25 - 49% of the time  Expression Expression assist level: Expresses basic 25 - 49% of the time/requires cueing 50 - 75% of the time. Uses single words/gestures.  Social Interaction Social Interaction assist level: Interacts appropriately 50 - 74% of the time - May be physically or verbally inappropriate.  Problem Solving Problem solving assist level: Solves basic 25 - 49% of the time - needs direction more than half the time to initiate, plan or complete simple activities  Memory Memory assist level: Recognizes or recalls less than 25% of the time/requires cueing greater than 75% of the time   Medical Problem List and Plan: 1. Functional deficits secondary to Left MCA infarct, Right hemiplegia, apraxia, aphasia  -continue CIR therapies 2. DVT Prophylaxis/Anticoagulation: Pharmaceutical: Lovenox 3. Chronic  DDD/Fibromyalgia/Pain Management: Used oxycodone prn at home per family. Continue tylenol prn for now  -appears comfortable at present 4. Mood: LCSW to follow for evaluation and support as mentation improves.  5. Neuropsych: This patient is not capable of making decisions on her own behalf. 6. Skin/Wound Care: Routine pressure relief measures  -ocean nasal spray for dry nasal mucosa 7. Fluids/Electrolytes/Nutrition:eating quite well with supervision, By mouth intake remains variable, fluid intake poor at around 300 mL per day, intake 25-40% of meals  -continue H20 flushes through PEG.Will increase volume, monitor for fluid overload 8. HTN: Monitor BP bid. On Lisinopril, hydralazine and catapres. reduce amlodipine to 5 mg, reduce Catapres to 0.1 mg, BP 118/63 this am 9. DM type 2: Family reports non-compliance with insulin at home. Will monitor BS ac/hs. Continue lantus bid with glipizide BID. Use SSI for elevated BS  -good control at present.cbg's reviewed CBG, well controlled at present,98 this am, monitor hypoglycemia 10. Anxiety disorder: Used Paxil and ativan at home. Monitor as awareness improves.   -resumed low dose paxil--follow up with family about home dose 11. Acute pre-renal azotemia: increased water flushes to qid. BUN stable at 23.    14. Constipation did have continent BM x 3 on 12/8,  15. Urinary retention which is intermittent. Patient will need to try voiding on a toilet with assistance, given malodorous urine  and need for catheterization check UA  16.  severe dysphagia status post stroke, Continue honey thick liquids, monitor for aspiration, Supplemental water boluses, LOS (Days) 16 A FACE TO FACE EVALUATION WAS PERFORMED  Navada Osterhout E 06/01/2015 9:17 AM

## 2015-06-01 NOTE — Progress Notes (Signed)
Nutrition Follow-up  DOCUMENTATION CODES:   Not applicable  INTERVENTION:  Provide 30 ml Prostat po TID, each supplement provides 100 kcal and 15 grams of protein.   Discontinue Ensure due to poor acceptance.   Continue free water flushes via tube.   Encourage adequate PO intake.   NUTRITION DIAGNOSIS:   Increased nutrient needs related to  (increased energy expenditure with participation of therapies ) as evidenced by estimated needs; ongoing  GOAL:   Patient will meet greater than or equal to 90% of their needs; progressing  MONITOR:   PO intake, Supplement acceptance, Diet advancement, Weight trends, Labs, I & O's  REASON FOR ASSESSMENT:   Consult Calorie Count  ASSESSMENT:   Pt admitted after MCA stroke. PEG placed during hospitalization.   Meal completion has been varied from 20-85% with 85% at breakfast this AM. Pt reports her appetite is just "ok". Pt currently has Ensure and prostat ordered. Pt has been consuming her Prostat however refusing her Ensure. RD to modify orders. Pt was encouraged to eat her food at meals and to take her supplements.   Diet Order:  DIET - DYS 1 Room service appropriate?: Yes; Fluid consistency:: Honey Thick  Skin:  Reviewed, no issues  Last BM:  12/8  Height:   Ht Readings from Last 1 Encounters:  05/16/15 5\' 10"  (1.778 m)    Weight:   Wt Readings from Last 1 Encounters:  05/23/15 182 lb 12.2 oz (82.9 kg)    Ideal Body Weight:  68.1 kg  BMI:  Body mass index is 26.22 kg/(m^2).  Estimated Nutritional Needs:   Kcal:  1800-2000  Protein:  85-95 grams  Fluid:  1.7 - 1.9 L/day  EDUCATION NEEDS:   No education needs identified at this time  Corrin Parker, MS, RD, LDN Pager # [phone] After hours/ weekend pager # [phone]

## 2015-06-01 NOTE — Progress Notes (Signed)
Physical Therapy Weekly Progress Note  Patient Details  Name: Kerry [name] MRN: [id number] Date of Birth: 03/09/1956  Beginning of progress report period: May 25, 2015 End of progress report period: June 01, 2015  Today's Date: 06/01/2015 PT Individual Time: 0900-1000 PT Individual Time Calculation (min): 60 min   Patient has met 3 of 4 short term goals.  Pt requires minA for bed mobility with cues and assistance for R extremity management. Pt demonstrates consistent modA squat pivot transfers from a variety of surfaces. Continues to requires maxA for gait due to R strength deficits, apraxia, inattention and decreased control during stance phase. Stair training has not been a major focus of treatment sessions as pt will be going home at w/c level and is not required to perform stairs to access home. Pt is limited due to apraxia, mild R inattention, communication impairments.   Patient continues to demonstrate the following deficits: activity tolerance, balance, postural control, ability to compensate for deficits, functional use of  right upper extremity and right lower extremity, attention, awareness and coordination.and therefore will continue to benefit from skilled PT intervention to enhance overall performance with bed mobility, transfers, gait, stairs, home and community access.   Patient progressing toward long term goals..  Continue plan of care.  PT Short Term Goals Week 2:  PT Short Term Goal 1 (Week 2): Pt will perform sit <>supine minA with HOB flat to simulate home environment PT Short Term Goal 1 - Progress (Week 2): Met PT Short Term Goal 2 (Week 2): Pt will perform squat pivot transfer with consistent modA +1 PT Short Term Goal 2 - Progress (Week 2): Met PT Short Term Goal 3 (Week 2): Pt will perform gait with LRAD x30' modA +1 and w/c follow for safety  PT Short Term Goal 3 - Progress (Week 2): Met PT Short Term Goal 4 (Week 2): Pt will perform ascent/descent  three 3-inch height stairs with maxA +1 PT Short Term Goal 4 - Progress (Week 2): Not met Week 3:  PT Short Term Goal 1 (Week 3): =LTG due to estimated length of stay  Skilled Therapeutic Interventions/Progress Updates:    Pt received seated in w/c, no evidence of pain via FACES scale. Transported to gym with [employer] for energy conservation. Transfer w/c <>nustep modA squat pivot. Nustep x10 min on level 2 with BLE, adduction assist for RLE to prevent external rotation/abduction. MinA initially to perform reciprocal movements, however able to maintain RLE AROM without assist for remainder of time; performed for RLE NMR, strengthening, aerobic endurance. Gait training immediately after nustep to assess carryover with RLE AROM. Two trials of 30' each with L rail, totalA for RLE progression and variable mod/maxA for RLE stance control. Sit <>stand from mat table with modA and w/c in front of pt for LUE support. In standing performed dynamic LUE beach ball hits while therapist provided facilitation at R glutes/quads for stance; two trials of approximately 2 min each with seated rest break between due to fatigue. Returned to room totalA. Squat pivot to return to bed modA. Sit >supine minA; remained supine in bed with alarm intact and all needs in reach at completion of session.    Therapy Documentation Precautions:  Precautions Precautions: Fall Precaution Comments: Peg, Rt shoulder subluxation Restrictions Weight Bearing Restrictions: No Pain: Pain Assessment Pain Assessment: Faces Faces Pain Scale: No hurt   See Function Navigator for Current Functional Status.  Therapy/Group: Individual Therapy  Luberta Mutter 06/01/2015, 11:18 AM

## 2015-06-01 NOTE — Progress Notes (Signed)
Speech Language Pathology Daily Session Note  Patient Details  Name: Kerry [name] MRN: [id number] Date of Birth: [birth year]  Today's Date: 06/01/2015 SLP Individual Time: 0800-0900 SLP Individual Time Calculation (min): 60 min  Short Term Goals: Week 3: SLP Short Term Goal 1 (Week 3): Pt will consume trials of dys 2 textures with min verbal cues for rate and portion control over 2 consecutive sessions prior to advancement  SLP Short Term Goal 2 (Week 3): Patient will vocalize in 50% of opportunities with Max assit multimodal cues   SLP Short Term Goal 3 (Week 3): Patient will follow 1-step directions during familiar tasks with Mod assist gesture cues SLP Short Term Goal 4 (Week 3): Pt will match object to phrase from a field of three with mod assist visual cues.   SLP Short Term Goal 5 (Week 3): Pt will consume trials of therapeutic ice chips with minimal overt s/s of aspiration and supervision cues for use of swallowing precautions over 3 targeted sessions.    Skilled Therapeutic Interventions:  Pt was seen for skilled ST targeting goals for dysphagia and communication.  Pt again initiated communication to indicate that her brief needed to be changed prior to transfer from bed to wheelchair via pointing, gesturing, and vocalizations.  SLP completed skilled observations with presentations of pt's currently prescribed diet and pt demonstrated no overt s/s of aspiration with pureed textures or honey thick liquids via teaspoon (per pt's preference).  Pt utilized rate and portion control to maximize safe swallowing with supervision cues.  SLP facilitated the session with trials of dys 2 textures to continue working towards diet progression and pt demonstrated audibly wet vocal quality during trials which ceased once trials were ended.  Question whether wet vocal quality is due to pharyngeal residuals remaining in the pharynx following mixed solids and pureed consistencies.  SLP will continue to  follow up with trials of advanced textures to continue working towards diet progression.  SLP also facilitated the session with a basic sequencing task targeting following commands in a functional context.  Pt sequenced 4 picture cards accurately in 4 out of 8 trials independently, improving to 7 out of 8 with min assist, and 8 out of 8 with mod assist verbal and visual cues.  Pt was returned to room and left in wheelchair with quick release belt donned and call bell left within reach.  Continue per current plan of care.    Function:  Eating Eating                 Cognition Comprehension Comprehension assist level: Understands basic 50 - 74% of the time/ requires cueing 25 - 49% of the time  Expression   Expression assist level: Expresses basic 25 - 49% of the time/requires cueing 50 - 75% of the time. Uses single words/gestures.  Social Interaction Social Interaction assist level: Interacts appropriately 50 - 74% of the time - May be physically or verbally inappropriate.  Problem Solving Problem solving assist level: Solves basic 50 - 74% of the time/requires cueing 25 - 49% of the time  Memory Memory assist level: Recognizes or recalls less than 25% of the time/requires cueing greater than 75% of the time    Pain Pain Assessment Pain Assessment: No/denies pain Faces Pain Scale: No hurt  Therapy/Group: Individual Therapy  Fatimah Sundquist, Selinda Orion 06/01/2015, 12:15 PM

## 2015-06-02 ENCOUNTER — Inpatient Hospital Stay (HOSPITAL_COMMUNITY): Payer: 59 | Admitting: Speech Pathology

## 2015-06-02 ENCOUNTER — Inpatient Hospital Stay (HOSPITAL_COMMUNITY): Payer: 59 | Admitting: Occupational Therapy

## 2015-06-02 ENCOUNTER — Inpatient Hospital Stay (HOSPITAL_COMMUNITY): Payer: 59 | Admitting: *Deleted

## 2015-06-02 NOTE — Progress Notes (Signed)
Speech Language Pathology Daily Session Note  Patient Details  Name: Kerry [name] MRN: [id number] Date of Birth: [birth year]  Today's Date: 06/02/2015 SLP Individual Time: [id number] SLP Individual Time Calculation (min): 57 min  Short Term Goals: Week 3: SLP Short Term Goal 1 (Week 3): Pt will consume trials of dys 2 textures with min verbal cues for rate and portion control over 2 consecutive sessions prior to advancement  SLP Short Term Goal 2 (Week 3): Patient will vocalize in 50% of opportunities with Max assit multimodal cues   SLP Short Term Goal 3 (Week 3): Patient will follow 1-step directions during familiar tasks with Mod assist gesture cues SLP Short Term Goal 4 (Week 3): Pt will match object to phrase from a field of three with mod assist visual cues.   SLP Short Term Goal 5 (Week 3): Pt will consume trials of thin liquids with minimal overt s/s of aspiration and supervision cues for use of swallowing precautions over 3 targeted sessions.   SLP Short Term Goal 5 - Progress (Week 3): Other (comment) (revised due to pt's preference of thin liquids versus ice chips )  Skilled Therapeutic Interventions:  Pt was seen for skilled ST targeting goals for dysphagia and communication.  SLP facilitated the session with trials of regular, unthickened water following thorough oral care to continue addressing liquids progression. Pt consumed ~3 oz of water and demonstrated multiple, audible swallows in 100% of trials.  No other overt s/s of aspiration evident with trials and no audible wetness noted after trials.  Recommend continued trials of water with SLP only to assess readiness for repeat objective swallow study.  Pt was able to produce 3 novel words out of 6 trials when naming objects in picture cards with max assist visual demonstration cues for phonemic placement and max assist verbal cues to monitor and correct verbal errors.  Pt was also able to sustain production of /i/ in isolation  with max assist demonstration cues; however, she was unable to alternate between production of different vowel sounds due to decreased motor planning and perseveration.   Pt was returned to room and left with quick release belt donned and call bell left within reach.  Continue per current plan of care.   Function:  Eating Eating                 Cognition Comprehension Comprehension assist level: Understands basic 50 - 74% of the time/ requires cueing 25 - 49% of the time  Expression   Expression assist level: Expresses basic 25 - 49% of the time/requires cueing 50 - 75% of the time. Uses single words/gestures.  Social Interaction Social Interaction assist level: Interacts appropriately 50 - 74% of the time - May be physically or verbally inappropriate.  Problem Solving Problem solving assist level: Solves basic 50 - 74% of the time/requires cueing 25 - 49% of the time  Memory Memory assist level: Recognizes or recalls less than 25% of the time/requires cueing greater than 75% of the time    Pain Pain Assessment Pain Assessment: No/denies pain  Therapy/Group: Individual Therapy  Knolan Simien, Selinda Orion 06/02/2015, 12:56 PM

## 2015-06-02 NOTE — Progress Notes (Signed)
Physical Therapy Session Note  Patient Details  Name: Kerry [name] MRN: [id number] Date of Birth: [birth year]  Today's Date: 06/02/2015 PT Individual Time: 0905-1020 PT Individual Time Calculation (min): 75 min   Short Term Goals: Week 3:  PT Short Term Goal 1 (Week 3): =LTG due to estimated length of stay  Skilled Therapeutic Interventions/Progress Updates:  Tx focused on functional mobility training, WC propulsion in low cushion, standing frame, gait at hall rail, and NMR via forced use, manual facilitation, and multi-modal cues throughout tx. Pt's mother arrived at end of tx and was educated on limb protection, positioning UE and ankle, and WC propulsion for increased independence with mobility. She was encouraged to have more family present for tx for education and training.   NMR during bed via rolling R/L, limb management, attempts at bridging, and transitional movements.  Pt performed all supine<>sit and squat pivot transfers via Bobath method with mod/max A and NMR for multi-modal cues. Pt did a nice job of following cues and instructions and was able to participate in all mobility components.   Pt performed WC propulsion with low cushion using hemi-technique and cues for sequence/stroke efficiency.     Standing frame 2x53min for activity tolerance and NMR via forced use and midline orientation with addition of cognitive tasks writing on mirror. HR increased during task, but returned to [facility] during seated rest. Pt able to assist with sit<>stand.   Gait at hall rail x20' with Max A for RLE management during swing and stance phases with mirror for visual feedback and manual facilitation for upward trunk and gaze. Limited by fatigue.   Pt left up in bed for brief change with NT.    Therapy Documentation Precautions:  Precautions Precautions: Fall Precaution Comments: Peg, Rt shoulder subluxation Restrictions Weight Bearing Restrictions: No General:   Vital Signs: Therapy  Vitals BP: 126/64 mmHg Pain: none except indicating sore in L ankle which was relieved with support, modified tx      See Function Navigator for Current Functional Status.   Therapy/Group: Individual Therapy  Tamikka Pilger, Corinna Lines, PT, DPT  06/02/2015, 11:13 AM

## 2015-06-02 NOTE — Progress Notes (Signed)
[place] PHYSICAL MEDICINE & REHABILITATION     PROGRESS NOTE    Subjective/Complaints: Pt's mom at bedside Pt says "leave" when she needs to have BM or urinate Pt also says "[name]" which is one of her son's names.  Unable to perform ROS due to aphasia  Objective: Vital Signs: Blood pressure 126/64, pulse 67, temperature 98.2 F (36.8 C), temperature source Axillary, resp. rate 20, weight 82.9 kg (182 lb 12.2 oz), SpO2 97 %. No results found. No results for input(s): WBC, HGB, HCT, PLT in the last 72 hours. No results for input(s): NA, K, CL, GLUCOSE, BUN, CREATININE, CALCIUM in the last 72 hours.  Invalid input(s): CO CBG (last 3)   Recent Labs  05/31/15 2112 06/01/15 0631 06/01/15 1119  GLUCAP 79 98 88    Wt Readings from Last 3 Encounters:  05/23/15 82.9 kg (182 lb 12.2 oz)  05/16/15 86.183 kg (190 lb)  04/27/15 92.5 kg (203 lb 14.8 oz)    Physical Exam:  Constitutional: She appears well-developed and well-nourished. No distress.  Laying in bed and comfortable  HENT: Lurline Idol site is healed Head: Normocephalic and atraumatic.  Mouth/Throat: Oropharynx is   Moist still with white film on tongue   Missing upper teeth and multiple lower teeth.  Eyes: Conjunctivae and EOM are normal. Pupils are equal, round, and reactive to light.  Neck: Normal range of motion. Neck supple.  Cardiovascular: Normal rate and regular rhythm. Exam reveals no friction rub.  Respiratory: Effort normal. No respiratory distress. She makes stridorous sounds when breathing GI: Soft. Bowel sounds are normal. She exhibits no distension. There is no tenderness.  PEG site clean and dry without drainage.  Musculoskeletal: She exhibits no edema or tenderness.  Neurological: She is alert.    Apraxia noted. occasionally with an appropriate y/n nod. Poor oral-motor control Inconsistently follows commands    Unable to perform motor and sensory testing, Grossly 4/5 in LUE and LLE.No  active movement right upper or right lower extremity Withdraws to pinch in the left upper and left lower but not in the right upper and right lower DTRs 3+ on right side    Skin: Skin is warm and dry. No rash noted. She is not diaphoretic. No erythema.  Psychiatric: She is not slowed and not withdrawn. Cognition and memory are impaired. She is noncommunicative. She is inattentive to right.     Assessment/Plan: 1. 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 Physiatrist is providing close team supervision and 24 hour management of active medical problems listed below. Physiatrist and rehab team continue to assess barriers to discharge/monitor patient progress toward functional and medical goals.  Function:  Bathing Bathing position   Position: Wheelchair/chair at sink  Bathing parts Body parts bathed by patient: Right arm, Chest, Abdomen, Right upper leg, Left upper leg Body parts bathed by helper: Left arm, Right lower leg, Left lower leg, Back  Bathing assist Assist Level:  (Mod assist, pt declined washing buttocks)      Upper Body Dressing/Undressing Upper body dressing   What is the patient wearing?: Pull over shirt/dress     Pull over shirt/dress - Perfomed by patient: Thread/unthread left sleeve, Put head through opening, Pull shirt over trunk Pull over shirt/dress - Perfomed by helper: Thread/unthread right sleeve        Upper body assist Assist Level: Touching or steadying assistance(Pt > 75%)  Lower Body Dressing/Undressing Lower body dressing   What is the patient wearing?: Pants, Shoes     Pants- Performed by patient: Thread/unthread left pants leg, Thread/unthread right pants leg Pants- Performed by helper: Pull pants up/down Non-skid slipper socks- Performed by patient: Don/doff right sock Non-skid slipper socks- Performed by helper:  Don/doff right sock, Don/doff left sock Socks - Performed by patient: Don/doff right sock Socks - Performed by helper: Don/doff left sock Shoes - Performed by patient: Don/doff left shoe Shoes - Performed by helper: Don/doff right shoe, Don/doff left shoe          Lower body assist Assist for lower body dressing:  (Max assist)      Naval architect activity did not occur: No continent bowel/bladder event   Toileting steps completed by helper: Adjust clothing prior to toileting, Performs perineal hygiene, Adjust clothing after toileting    Toileting assist Assist level: Touching or steadying assistance (Pt.75%)   Transfers Chair/bed transfer   Chair/bed transfer method: Squat pivot Chair/bed transfer assist level: Moderate assist (Pt 50 - 74%/lift or lower) Chair/bed transfer assistive device: Armrests     Locomotion Ambulation Ambulation activity did not occur: Safety/medical concerns   Max distance: 30 Assist level: 2 helpers   Oceanographer activity did not occur: Safety/medical concerns (pt in tilt-in-space w/c, unable to propel) Type: Manual Max wheelchair distance: 100 Assist Level: Touching or steadying assistance (Pt > 75%)  Cognition Comprehension Comprehension assist level: Understands basic 50 - 74% of the time/ requires cueing 25 - 49% of the time  Expression Expression assist level: Expresses basis less than 25% of the time/requires cueing >75% of the time.  Social Interaction Social Interaction assist level: Interacts appropriately 50 - 74% of the time - May be physically or verbally inappropriate.  Problem Solving Problem solving assist level: Solves basic 50 - 74% of the time/requires cueing 25 - 49% of the time  Memory Memory assist level: Recognizes or recalls less than 25% of the time/requires cueing greater than 75% of the time   Medical Problem List and Plan: 1. Functional deficits secondary to Left MCA infarct, Right hemiplegia,  apraxia, aphasia  -continue CIR therapies 2. DVT Prophylaxis/Anticoagulation: Pharmaceutical: Lovenox 3. Chronic DDD/Fibromyalgia/Pain Management: Used oxycodone prn at home per family. Continue tylenol prn for now  -appears comfortable at present 4. Mood: LCSW to follow for evaluation and support as mentation improves.  5. Neuropsych: This patient is not capable of making decisions on her own behalf. 6. Skin/Wound Care: Routine pressure relief measures  -ocean nasal spray for dry nasal mucosa 7. Fluids/Electrolytes/Nutrition:eating quite well with supervision, By mouth intake remains variable, fluid intake poor at around 300 mL per day, intake 25-40% of meals  -continue H20 flushes through PEG.Will increase volume, monitor for fluid overload 8. HTN: Monitor BP bid. On Lisinopril, hydralazine and catapres. reduce amlodipine to 5 mg, reduce Catapres to 0.1 mg, BP 126/64 this am, cont current meds 9. DM type 2: Family reports non-compliance with insulin at home. Will monitor BS ac/hs. Continue lantus bid with glipizide BID. Use SSI for elevated BS  -good control at present.cbg's reviewed CBG, well controlled at present,98 this am, monitor hypoglycemia 10. Anxiety disorder: Used Paxil and ativan at home. Monitor as awareness improves.   -resumed low dose paxil--follow up with family about home dose 11. Acute pre-renal azotemia: increased water flushes to qid. BUN stable at 23. Discussed purpose of PEG with mom   14. Constipation resolved, daily cont BMs 15. Urinary  retention which is intermittent. Patient will need to try voiding on a toilet with assistance, given malodorous urine and need for catheterization check UA  16.  severe dysphagia status post stroke, Continue honey thick liquids, monitor for aspiration, Supplemental water boluses,? Recheck MBS next week to see if fluids can be upgraded LOS (Days) 17 A FACE TO FACE EVALUATION WAS PERFORMED  Alysia Penna E 06/02/2015 10:40  AM

## 2015-06-02 NOTE — Progress Notes (Signed)
Occupational Therapy Session Note  Patient Details  Name: Kerry [name] MRN: [id number] Date of Birth: May 16, 1956  Today's Date: 06/02/2015 OT Individual Time:  -   1500-1600  (60 min)      Short Term Goals: Week 1:  OT Short Term Goal 1 (Week 1): Pt will maintain static sitting balance at EOB in preparation for self-care tasks OT Short Term Goal 1 - Progress (Week 1): Met OT Short Term Goal 2 (Week 1): Pt will complete bathing with mod assist and min cues for sequencing OT Short Term Goal 2 - Progress (Week 1): Met OT Short Term Goal 3 (Week 1): Pt will complete UB dressing wiht mod assist and min cues for sequencing OT Short Term Goal 3 - Progress (Week 1): Met OT Short Term Goal 4 (Week 1): Pt will complete toilet transfer to drop arm BSC with mod assist of 1 caregiver OT Short Term Goal 4 - Progress (Week 1): Met OT Short Term Goal 5 (Week 1): Pt will position RUE in preparation for transfers with min cues OT Short Term Goal 5 - Progress (Week 1): Met Week 2:  OT Short Term Goal 1 (Week 2): Pt will complete bathing with min assist at sit > stand level OT Short Term Goal 2 (Week 2): Pt will complete LB dressing with mod assist at sit > stand level OT Short Term Goal 3 (Week 2): Pt will complete toilet transfer to regular commode with mod assist of 1 caregiver OT Short Term Goal 4 (Week 2): Pt will complete 1 grooming task in standing with min assist for standing balance Week 3:     Skilled Therapeutic Interventions/Progress Updates:    Skilled OT intervention with treatment focus on the following:  Bed mobility, transfer, sitting balance, NMRE, LUE functional activity.  Pt went from lying to sitting with mod assist.  Sat EOB for 5 minutes fo don pants  (max assist with pants).   Pt goes from sit to stand with mod assst and transfer to wc with max assist.  Pt propell wc to day room with min assist.  Engages in writing activity for attention and in purposeful and meaningful  activity.  Pt. Able to cirlcle correct shape 50 % of time.  Returns to room and transfers to bed with max assist.  Left pt in  bed with  and call bell,phone within reach.    Therapy Documentation Precautions:  Precautions Precautions: Fall Precaution Comments: Peg, Rt shoulder subluxation Restrictions Weight Bearing Restrictions: No       Pain:  none          See Function Navigator for Current Functional Status.   Therapy/Group: Individual Therapy  Lisa Roca 06/02/2015, 5:47 PM

## 2015-06-03 ENCOUNTER — Inpatient Hospital Stay (HOSPITAL_COMMUNITY): Payer: 59 | Admitting: Occupational Therapy

## 2015-06-03 LAB — GLUCOSE, CAPILLARY
GLUCOSE-CAPILLARY: 59 mg/dL — AB (ref 65–99)
GLUCOSE-CAPILLARY: 58 mg/dL — AB (ref 65–99)
GLUCOSE-CAPILLARY: 116 mg/dL — AB (ref 65–99)
Glucose-Capillary: 94 mg/dL (ref 65–99)
Glucose-Capillary: 86 mg/dL (ref 65–99)
Glucose-Capillary: 88 mg/dL (ref 65–99)
Glucose-Capillary: 106 mg/dL — ABNORMAL HIGH (ref 65–99)
Glucose-Capillary: 81 mg/dL (ref 65–99)

## 2015-06-03 NOTE — Progress Notes (Signed)
[facility]     PROGRESS NOTE    Subjective/Complaints: Perseverates on "Kerry [name]" which is one of her son's names.  Unable to perform ROS due to aphasia  Objective: Vital Signs: Blood pressure 136/63, pulse 68, temperature 97.9 F (36.6 C), temperature source Oral, resp. rate 18, weight 82.9 kg (182 lb 12.2 oz), SpO2 99 %. No results found. No results for input(s): WBC, HGB, HCT, PLT in the last 72 hours. No results for input(s): NA, K, CL, GLUCOSE, BUN, CREATININE, CALCIUM in the last 72 hours.  Invalid input(s): CO CBG (last 3)   Recent Labs  06/02/15 1146 06/02/15 2139 06/02/15 2230  GLUCAP 88 106* 81    Wt Readings from Last 3 Encounters:  05/23/15 82.9 kg (182 lb 12.2 oz)  05/16/15 86.183 kg (190 lb)  04/27/15 92.5 kg (203 lb 14.8 oz)    Physical Exam:  Constitutional: She appears well-developed and well-nourished. No distress.  Laying in bed and comfortable  HENT: Lurline Idol site is healed Head: Normocephalic and atraumatic.  Mouth/Throat: Oropharynx is   Moist still with white film on tongue   Missing upper teeth and multiple lower teeth.  Eyes: Conjunctivae and EOM are normal. Pupils are equal, round, and reactive to light.  Neck: Normal range of motion. Neck supple.  Cardiovascular: Normal rate and regular rhythm. Exam reveals no friction rub.  Respiratory: Effort normal. No respiratory distress. She makes stridorous sounds when breathing GI: Soft. Bowel sounds are normal. She exhibits no distension. There is no tenderness.  PEG site clean and dry without drainage.  Musculoskeletal: She exhibits no edema or tenderness.  Neurological: She is alert.    Apraxia noted. occasionally with an appropriate y/n nod. Poor oral-motor control Inconsistently follows commands    Unable to perform motor and sensory testing due to severe apraxia , Grossly 4/5 in LUE and LLE.No active movement right upper or right lower  extremity Withdraws to pinch in the left upper and left lower but not in the right upper and right lower DTRs 3+ on right side    Skin: Skin is warm and dry. No rash noted. She is not diaphoretic. No erythema.  Psychiatric: She is not slowed and not withdrawn. Cognition and memory are impaired. She is noncommunicative. She is inattentive to right.     Assessment/Plan: 1. 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 Physiatrist is providing close team supervision and 24 hour management of active medical problems listed below. Physiatrist and rehab team continue to assess barriers to discharge/monitor patient progress toward functional and medical goals.  Function:  Bathing Bathing position   Position: Shower  Bathing parts Body parts bathed by patient: Chest, Abdomen, Right upper leg, Left upper leg, Front perineal area, Right arm, Right lower leg, Left lower leg Body parts bathed by helper: Right arm, Back, Buttocks  Bathing assist Assist Level: Touching or steadying assistance(Pt > 75%)      Upper Body Dressing/Undressing Upper body dressing   What is the patient wearing?: Pull over shirt/dress     Pull over shirt/dress - Perfomed by patient: Thread/unthread left sleeve, Put head through opening, Pull shirt over trunk Pull over shirt/dress - Perfomed by helper: Thread/unthread right sleeve        Upper body assist Assist Level: Touching or steadying assistance(Pt > 75%)      Lower Body Dressing/Undressing Lower body dressing   What  is the patient wearing?: Pants, Non-skid slipper socks     Pants- Performed by patient: Thread/unthread left pants leg Pants- Performed by helper: Pull pants up/down, Thread/unthread right pants leg Non-skid slipper socks- Performed by patient: Don/doff right sock Non-skid slipper socks- Performed by helper: Don/doff right  sock, Don/doff left sock Socks - Performed by patient: Don/doff right sock Socks - Performed by helper: Don/doff left sock Shoes - Performed by patient: Don/doff left shoe Shoes - Performed by helper: Don/doff right shoe, Don/doff left shoe          Lower body assist Assist for lower body dressing: 2 Helpers      Toileting Toileting Toileting activity did not occur: No continent bowel/bladder event   Toileting steps completed by helper: Adjust clothing prior to toileting, Performs perineal hygiene, Adjust clothing after toileting    Toileting assist Assist level: Touching or steadying assistance (Pt.75%)   Transfers Chair/bed transfer   Chair/bed transfer method: Squat pivot Chair/bed transfer assist level: Moderate assist (Pt 50 - 74%/lift or lower) Chair/bed transfer assistive device: Armrests     Locomotion Ambulation Ambulation activity did not occur: Safety/medical concerns   Max distance: 20 Assist level: 2 helpers   Oceanographer activity did not occur: Safety/medical concerns (pt in tilt-in-space w/c, unable to propel) Type: Manual Max wheelchair distance: 75 Assist Level: Touching or steadying assistance (Pt > 75%)  Cognition Comprehension Comprehension assist level: Understands basic 50 - 74% of the time/ requires cueing 25 - 49% of the time  Expression Expression assist level: Expresses basic 25 - 49% of the time/requires cueing 50 - 75% of the time. Uses single words/gestures.  Social Interaction Social Interaction assist level: Interacts appropriately 50 - 74% of the time - May be physically or verbally inappropriate.  Problem Solving Problem solving assist level: Solves basic 50 - 74% of the time/requires cueing 25 - 49% of the time  Memory Memory assist level: Recognizes or recalls less than 25% of the time/requires cueing greater than 75% of the time   Medical Problem List and Plan: 1. Functional deficits secondary to Left MCA infarct, Right  hemiplegia, apraxia, aphasia, no motor recovery noted thus far  -continue CIR therapies 2. DVT Prophylaxis/Anticoagulation: Pharmaceutical: Lovenox 3. Chronic DDD/Fibromyalgia/Pain Management: Used oxycodone prn at home per family. Continue tylenol prn for now  -appears comfortable at present 4. Mood: LCSW to follow for evaluation and support as mentation improves.  5. Neuropsych: This patient is not capable of making decisions on her own behalf. 6. Skin/Wound Care: Routine pressure relief measures  -ocean nasal spray for dry nasal mucosa 7. Fluids/Electrolytes/Nutrition:eating quite well with supervision, By mouth intake remains variable, fluid intake poor at around 300 mL per day, intake 25-40% of meals  -continue H20 flushes through PEG.Will increase volume, monitor for fluid overload 8. HTN: Monitor BP bid. On Lisinopril, hydralazine and catapres. reduce amlodipine to 5 mg, reduce Catapres to 0.1 mg, BP 136/63 this am, cont current meds 9. DM type 2: Family reports non-compliance with insulin at home. Will monitor BS ac/hs. Continue lantus bid with glipizide BID. Use SSI for elevated BS  -good control at present.cbg's reviewed CBG, well controlled at present,98 this am, monitor hypoglycemia 10. Anxiety disorder: Used Paxil and ativan at home. Monitor as awareness improves.   -resumed low dose paxil--follow up with family about home dose 11. Acute pre-renal azotemia: increased water flushes to qid. BUN stable at 23. Will need to try off PEG flushes prior to d/c and see if  pt can maintain hydration po   14. Constipation resolved, daily cont BMs 15. Urinary retention which is intermittent. Patient will need to try voiding on a toilet with assistance, given malodorous urine and need for catheterization check UA  16.  severe dysphagia status post stroke, Continue honey thick liquids, monitor for aspiration, Supplemental water boluses,Recheck MBS this week to see if fluids can be  upgraded LOS (Days) 18 A FACE TO FACE EVALUATION WAS PERFORMED  Tameca Jerez E 06/03/2015 10:09 AM

## 2015-06-03 NOTE — Progress Notes (Signed)
Occupational Therapy Session Note  Patient Details  Name: Kerry [name] MRN: [id number] Date of Birth: 01/19/1956  Today's Date: 06/03/2015 OT Individual Time:  - 0800-0900  (60 min)      Short Term Goals: Week 1:  OT Short Term Goal 1 (Week 1): Pt will maintain static sitting balance at EOB in preparation for self-care tasks OT Short Term Goal 1 - Progress (Week 1): Met OT Short Term Goal 2 (Week 1): Pt will complete bathing with mod assist and min cues for sequencing OT Short Term Goal 2 - Progress (Week 1): Met OT Short Term Goal 3 (Week 1): Pt will complete UB dressing wiht mod assist and min cues for sequencing OT Short Term Goal 3 - Progress (Week 1): Met OT Short Term Goal 4 (Week 1): Pt will complete toilet transfer to drop arm BSC with mod assist of 1 caregiver OT Short Term Goal 4 - Progress (Week 1): Met OT Short Term Goal 5 (Week 1): Pt will position RUE in preparation for transfers with min cues OT Short Term Goal 5 - Progress (Week 1): Met Week 2:  OT Short Term Goal 1 (Week 2): Pt will complete bathing with min assist at sit > stand level OT Short Term Goal 2 (Week 2): Pt will complete LB dressing with mod assist at sit > stand level OT Short Term Goal 3 (Week 2): Pt will complete toilet transfer to regular commode with mod assist of 1 caregiver OT Short Term Goal 4 (Week 2): Pt will complete 1 grooming task in standing with min assist for standing balance      Skilled Therapeutic Interventions/Progress Updates:     .Focus of treatment was bed mobility, transfers,  Neuro-muscular reeducation, sitting balance, standing balance,  therapeutic activities, sustained attention, postural control. OT transferers pt to shower chair and rolls her into shower.  SEE function for levels.  +2 to go from sit to stand to clean buttocks.  Pt incontinent of bowels.  Pt placed in wc with max assist and performed dressing.  Pt transferred to bed at end of session and left with all things  in reach.       Therapy Documentation Precautions:  Precautions Precautions: Fall Precaution Comments: Peg, Rt shoulder subluxation Restrictions Weight Bearing Restrictions: No    Vital Signs: Therapy Vitals Temp: 97.9 F (36.6 C) Temp Source: Oral Pulse Rate: 68 Resp: 18 BP: 136/63 mmHg Patient Position (if appropriate): Lying Oxygen Therapy SpO2: 99 % O2 Device: Not Delivered Pain:  none        See Function Navigator for Current Functional Status.   Therapy/Group: Individual Therapy  Lisa Roca 06/03/2015, 7:48 AM

## 2015-06-04 ENCOUNTER — Inpatient Hospital Stay (HOSPITAL_COMMUNITY): Payer: 59

## 2015-06-04 ENCOUNTER — Inpatient Hospital Stay (HOSPITAL_COMMUNITY): Payer: 59 | Admitting: Speech Pathology

## 2015-06-04 ENCOUNTER — Inpatient Hospital Stay (HOSPITAL_COMMUNITY): Payer: 59 | Admitting: Physical Therapy

## 2015-06-04 ENCOUNTER — Inpatient Hospital Stay (HOSPITAL_COMMUNITY): Payer: 59 | Admitting: Occupational Therapy

## 2015-06-04 LAB — CBC
HCT: 37.1 % (ref 36.0–46.0)
HEMATOCRIT: 36 % (ref 36.0–46.0)
HEMOGLOBIN: 11.5 g/dL — AB (ref 12.0–15.0)
Hemoglobin: 11.7 g/dL — ABNORMAL LOW (ref 12.0–15.0)
MCH: 27.5 pg (ref 26.0–34.0)
MCH: 27.9 pg (ref 26.0–34.0)
MCHC: 31.5 g/dL (ref 30.0–36.0)
MCHC: 31.9 g/dL (ref 30.0–36.0)
MCV: 87.3 fL (ref 78.0–100.0)
MCV: 87.4 fL (ref 78.0–100.0)
PLATELETS: 242 10*3/uL (ref 150–400)
Platelets: 238 10*3/uL (ref 150–400)
RBC: 4.25 MIL/uL (ref 3.87–5.11)
RBC: 4.12 MIL/uL (ref 3.87–5.11)
RDW: 14.6 % (ref 11.5–15.5)
RDW: 14.5 % (ref 11.5–15.5)
WBC: 8.7 10*3/uL (ref 4.0–10.5)
WBC: 8.9 10*3/uL (ref 4.0–10.5)

## 2015-06-04 LAB — BASIC METABOLIC PANEL
Anion gap: 10 (ref 5–15)
BUN: 12 mg/dL (ref 6–20)
CALCIUM: 9.6 mg/dL (ref 8.9–10.3)
CO2: 29 mmol/L (ref 22–32)
CREATININE: 0.54 mg/dL (ref 0.44–1.00)
Chloride: 100 mmol/L — ABNORMAL LOW (ref 101–111)
GFR calc Af Amer: >60 mL/min (ref >60)
GFR calc non Af Amer: >60 mL/min (ref >60)
GLUCOSE: 111 mg/dL — AB (ref 65–99)
Potassium: 3.3 mmol/L — ABNORMAL LOW (ref 3.5–5.1)
Sodium: 139 mmol/L (ref 135–145)

## 2015-06-04 MED ORDER — PANTOPRAZOLE SODIUM 40 MG IV SOLR
40.0000 mg | Freq: Two times a day (BID) | INTRAVENOUS | Status: DC
  Administered 2015-06-04 – 2015-06-05 (×3): 40 mg via INTRAVENOUS
  Filled 2015-06-04 (×3): qty 40

## 2015-06-04 MED ORDER — POTASSIUM CHLORIDE IN NACL 20-0.9 MEQ/L-% IV SOLN
INTRAVENOUS | Status: DC
  Administered 2015-06-04: 19:00:00 via INTRAVENOUS
  Filled 2015-06-04 (×2): qty 1000

## 2015-06-04 MED ORDER — AMOXICILLIN 250 MG PO CAPS
250.0000 mg | ORAL_CAPSULE | Freq: Three times a day (TID) | ORAL | Status: DC
Start: 2015-06-04 — End: 2015-06-05
  Filled 2015-06-04: qty 1

## 2015-06-04 MED ORDER — GLIPIZIDE 5 MG PO TABS
5.0000 mg | ORAL_TABLET | Freq: Every day | ORAL | Status: DC
  Administered 2015-06-06: 5 mg via ORAL
  Filled 2015-06-04: qty 1

## 2015-06-04 MED ORDER — INSULIN GLARGINE 100 UNIT/ML ~~LOC~~ SOLN
12.0000 [IU] | Freq: Two times a day (BID) | SUBCUTANEOUS | Status: DC
  Filled 2015-06-04 (×3): qty 0.12

## 2015-06-04 MED ORDER — POTASSIUM CHLORIDE 10 MEQ/100ML IV SOLN
10.0000 meq | Freq: Once | INTRAVENOUS | Status: DC
  Filled 2015-06-04: qty 100

## 2015-06-04 NOTE — Progress Notes (Signed)
Refusing scheduled nose spray at HS, pushes RN's hand away. Nose bled on previous shift. Decreased appetite. No void, I & O cath=400. Incontinent of stool. Doesn't call for assistance. Patrici Ranks A

## 2015-06-04 NOTE — Progress Notes (Signed)
Physical Therapy Session Note  Patient Details  Name: Kerry [name] MRN: [id number] Date of Birth: Feb 25, 1956  Today's Date: 06/04/2015 PT Individual Time: 0902-0931 PT Individual Time Calculation (min): 29 min   Short Term Goals: Week 3:  PT Short Term Goal 1 (Week 3): =LTG due to estimated length of stay  Skilled Therapeutic Interventions/Progress Updates:    Pt received resting in w/c and agreeable to therapy session.  O2 sats throughout session ranged 96-100% on room air.  PT instructed patient in w/c negotiation x50 feet with overall mod assist for straight path navigation.  Pt occasionally requiring total assist to negotiate away from obstacles in R visual field.  PT propelled remaining distance to therapy gym for time management.  PT instructed patient in sit<>stand at // bars with max assist and with facilitation at RLE and trunk for upright posture in standing.  PT instructed patient in dynamic standing balance activity reaching across midline for bags with min/mod assist to maintain standing balance and min verbal cues for simple sorting task with bags.  Pt returned to w/c and PT propelled pt back to room at end of session.  Pt left sitting in w/c with QRB in place, call bell in reach and needs met.   Therapy Documentation Precautions:  Precautions Precautions: Fall Precaution Comments: Peg, Rt shoulder subluxation Restrictions Weight Bearing Restrictions: No Pain: Pain Assessment Pain Assessment: Faces Faces Pain Scale: No hurt   See Function Navigator for Current Functional Status.   Therapy/Group: Individual Therapy  Solei Wubben E Penven-Crew 06/04/2015, 10:01 AM

## 2015-06-04 NOTE — Progress Notes (Signed)
Speech Language Pathology Daily Session Note  Patient Details  Name: Kerry [name] MRN: [id number] Date of Birth: September 17, 1955  Today's Date: 06/04/2015 SLP Individual Time: 0930-1030 SLP Individual Time Calculation (min): 60 min  Short Term Goals: Week 3: SLP Short Term Goal 1 (Week 3): Pt will consume trials of dys 2 textures with min verbal cues for rate and portion control over 2 consecutive sessions prior to advancement  SLP Short Term Goal 2 (Week 3): Patient will vocalize in 50% of opportunities with Max assit multimodal cues   SLP Short Term Goal 3 (Week 3): Patient will follow 1-step directions during familiar tasks with Mod assist gesture cues SLP Short Term Goal 4 (Week 3): Pt will match object to phrase from a field of three with mod assist visual cues.   SLP Short Term Goal 5 (Week 3): Pt will consume trials of thin liquids with minimal overt s/s of aspiration and supervision cues for use of swallowing precautions over 3 targeted sessions.   SLP Short Term Goal 5 - Progress (Week 3): Other (comment) (revised due to pt's preference of thin liquids versus ice chips )  Skilled Therapeutic Interventions: Skilled treatment session focused on addressing cognitive-linguistic and dysphagia goals. SLP facilitated session by providing set-up and Min verbal, visual cues for accuracy during oral care via suctioning.  Patient then consumed about 2 oz of water via cup with consistent use of multiple swallows and no overt s/s of aspiration. Recommend repeat objective assessment given sensory and motor impairments prior to advancement. Patient's spontaneous verbal expression is primarily comprised of perseverative neologisms.  She was intermittently able to produce yea appropriately; however, with use of written aid patient achieved 90% accuracy with expression of yes/no.  Patient provided with aid at end of session for carryover.  She labeled an item accurately with sentence completion and  phonemic placement cue (1/10 trials).  Continue per current plan of care.   Function:  Cognition Comprehension Comprehension assist level: Understands basic 50 - 74% of the time/ requires cueing 25 - 49% of the time  Expression   Expression assist level: Expresses basic 25 - 49% of the time/requires cueing 50 - 75% of the time. Uses single words/gestures.  Social Interaction Social Interaction assist level: Interacts appropriately 50 - 74% of the time - May be physically or verbally inappropriate.  Problem Solving Problem solving assist level: Solves basic 25 - 49% of the time - needs direction more than half the time to initiate, plan or complete simple activities  Memory Memory assist level: Recognizes or recalls 25 - 49% of the time/requires cueing 50 - 75% of the time    Pain Pain Assessment Pain Assessment: No/denies pain Faces Pain Scale: No hurt  Therapy/Group: Individual Therapy  Carmelia Roller., CCC-SLP [id number]  [place] 06/04/2015, 1:54 PM

## 2015-06-04 NOTE — Progress Notes (Signed)
Order received to flush peg with 50 ml of water, and to contact GI physician if black or red fluid return. Peg flushed with 50 ml of water, no black or red fluid return.

## 2015-06-04 NOTE — Progress Notes (Signed)
Physical Therapy Session Note  Patient Details  Name: Kerry [name] MRN: [id number] Date of Birth: 1955/06/28  Today's Date: 06/04/2015 PT Individual Time: 1430-1515 PT Individual Time Calculation (min): 45 min   Short Term Goals: Week 3:  PT Short Term Goal 1 (Week 3): =LTG due to estimated length of stay  Skilled Therapeutic Interventions/Progress Updates:    Pt received supine in bed with HOB elevated and pt appeared distressed, reaching to bed controls. Laid pt down and appeared to relieve pt anxiety for a few minutes, however pt began getting frustrated again and attempting to verbalize needs. Pt assisted supine>sit with modA to sit EOB. Scooting to R side with minA, and pt became emotional, tearful. Questioned pt regarding need to use restroom, change brief, presence of pain and unable to determine cause of anxiety/frustration. Returned pt to supine and assisted in scooting HOB with +2 A and cues for hand placement on bedrails. Pt continues to verbalize needs however unable to determine what pt is trying to say. Vitals assessed with dynamap with BP 201/92 HR 116, O2 97%. Reassessed with manual cuff 150/80 BP. HOB elevated and pt began to throw up; nurse alerted to vommiting and possible pain. LE AROM assessed; cued pt for RLE extension however unable to volitionally move RLE, able to push against resistance on LLE. Attempted bridging with BLE to facilitate RLE activation during functional task, with cues at BLEs knees and hips, however no palpable activation noted. Pt remained seated in bed with HOB elevated and RN present to assess.    Therapy Documentation Precautions:  Precautions Precautions: Fall Precaution Comments: Peg, Rt shoulder subluxation Restrictions Weight Bearing Restrictions: No General: PT Amount of Missed Time (min): 15 Minutes PT Missed Treatment Reason: Patient ill (Comment);Nursing care (vomitting, RN present to assess) Pain: Pain Assessment Pain  Assessment: Faces Pain Score: Asleep Faces Pain Scale: Hurts little more   See Function Navigator for Current Functional Status.   Therapy/Group: Individual Therapy  Luberta Mutter 06/04/2015, 3:47 PM

## 2015-06-04 NOTE — Consult Note (Signed)
Referring Provider: Dr. Letta Pate Primary Care Physician:  [name], MD Primary Gastroenterologist:  Althia Forts  Reason for Consultation:  GI bleed  HPI: Kerry [name] is a 59 y.o. female with recent CVA and s/p PEG placement in October 2016 being seen for a consult due to the acute onset of coffee grounds emesis today. She was nauseous and then vomited up black emesis and about 80 cc of black fluid was aspirated from the PEG tube. Reportedly has been having a nose bleed. Husband reports a remote history of a hiatal hernia on an EGD. Patient aphasic. Husband at bedside.   Past Medical History  Diagnosis Date  . Hypertension   . Coronary artery disease   . Diabetes mellitus   . Breast cancer ([facility]) 03/10/2007    Left breat  . Degenerative disc disease, cervical   . Degenerative disc disease, lumbar   . Fibromyalgia   . Hyperlipidemia   . Carpal tunnel syndrome on right     Past Surgical History  Procedure Laterality Date  . Cardiac surgery    . Appendectomy    . Breast surgery    . Mastectomy Left 03/2007  . Cardiac catheterization  2005  . Vein surgery Right 2005  . Tubal ligation Right   . Radiology with anesthesia N/A 04/11/2015    Procedure: RADIOLOGY WITH ANESTHESIA;  Surgeon: Luanne Bras, MD;  Location: [place];  Service: Radiology;  Laterality: N/A;  . Cardiac catheterization N/A 04/13/2015    Procedure: Temporary Pacemaker;  Surgeon: Peter M Martinique, MD;  Location: [facility];  Service: Cardiovascular;  Laterality: N/A;  . Peg placement N/A 04/18/2015    Procedure: PERCUTANEOUS ENDOSCOPIC GASTROSTOMY (PEG) PLACEMENT;  Surgeon: Judeth Horn, MD;  Location: [facility];  Service: General;  Laterality: N/A;  bedside  . Esophagogastroduodenoscopy N/A 04/18/2015    Procedure: ESOPHAGOGASTRODUODENOSCOPY (EGD);  Surgeon: Judeth Horn, MD;  Location: [place];  Service: General;  Laterality: N/A;    Prior to Admission medications   Medication  Sig Start Date End Date Taking? Authorizing Provider  acetaminophen (TYLENOL) 160 MG/5ML solution Place 20.3 mLs (650 mg total) into feeding tube every 4 (four) hours as needed for mild pain, headache or fever. 04/27/15   Donzetta Starch, NP  acetaminophen (TYLENOL) 325 MG tablet Take 650 mg by mouth every 6 (six) hours as needed for mild pain (temp > 101).   Yes Historical Provider, MD  acetaminophen (TYLENOL) 650 MG suppository Place 1 suppository (650 mg total) rectally every 4 (four) hours as needed for mild pain (temp >/= 99.5). Patient taking differently: Place 650 mg rectally every 6 (six) hours as needed for mild pain (temp > 101).  04/27/15   Donzetta Starch, NP  acetylcysteine (MUCOMYST) 20 % nebulizer solution Take 3 mLs by nebulization every 6 (six) hours. Patient not taking: Reported on 05/16/2015 04/27/15   Donzetta Starch, NP  albuterol (PROVENTIL) (2.5 MG/3ML) 0.083% nebulizer solution Take 3 mLs (2.5 mg total) by nebulization every 6 (six) hours. 04/27/15   Donzetta Starch, NP  Amino Acids-Protein Hydrolys (FEEDING SUPPLEMENT, PRO-STAT SUGAR FREE 64,) LIQD Take 30 mLs by mouth daily.   Yes Historical Provider, MD  amLODipine (NORVASC) 10 MG tablet Take 1 tablet (10 mg total) by mouth daily. 04/27/15   Donzetta Starch, NP  antiseptic oral rinse (CPC / CETYLPYRIDINIUM CHLORIDE 0.05%) 0.05 % LIQD solution 7 mLs by Mouth Rinse route 2 times daily at 12 noon and 4 pm. 04/27/15  Donzetta Starch, NP  atorvastatin (LIPITOR) 20 MG tablet Take 20 mg by mouth at bedtime.   Yes Historical Provider, MD  bisacodyl (DULCOLAX) 10 MG suppository Place 1 suppository (10 mg total) rectally daily as needed for moderate constipation. 04/27/15   Donzetta Starch, NP  cefTAZidime 2 g in dextrose 5 % 50 mL Inject 2 g into the vein every 8 (eight) hours. Patient not taking: Reported on 05/16/2015 04/27/15   Donzetta Starch, NP  chlorhexidine (PERIDEX) 0.12 % solution 15 mLs by Mouth Rinse route 2 (two) times daily. 04/27/15   Donzetta Starch, NP  cloNIDine (CATAPRES) 0.1 MG tablet Take 0.2 mg by mouth 2 (two) times daily.   Yes Historical Provider, MD  cloNIDine (CATAPRES) 0.2 MG tablet Place 1 tablet (0.2 mg total) into feeding tube 2 (two) times daily. 04/27/15  Yes Donzetta Starch, NP  clopidogrel (PLAVIX) 75 MG tablet Take 1 tablet (75 mg total) by mouth daily. 04/27/15  Yes Donzetta Starch, NP  enoxaparin (LOVENOX) 40 MG/0.4ML injection Inject 40 mg into the skin daily.   Yes Historical Provider, MD  famotidine (PEPCID) 20 MG tablet Take 20 mg by mouth 2 (two) times daily.   Yes Historical Provider, MD  feeding supplement, ENSURE, (ENSURE) PUDG Take 1 Container by mouth 3 (three) times daily between meals.   Yes Historical Provider, MD  Fish Oil OIL Take 160 mg by mouth daily. 160 mg per 5 ml   Yes Historical Provider, MD  folic acid (FOLVITE) 1 MG tablet Take 1 mg by mouth daily.   Yes Historical Provider, MD  glipiZIDE (GLUCOTROL) 5 MG tablet Take by mouth daily before breakfast.   Yes Historical Provider, MD  heparin 5000 UNIT/ML injection Inject 1 mL (5,000 Units total) into the skin every 8 (eight) hours. 04/27/15   Donzetta Starch, NP  hydrALAZINE (APRESOLINE) 10 MG tablet Take 10 mg by mouth 3 (three) times daily.   Yes Historical Provider, MD  hydrALAZINE (APRESOLINE) 20 MG/ML injection Inject 1.25 mLs (25 mg total) into the vein every 4 (four) hours as needed (SBP > 160). 04/27/15   Donzetta Starch, NP  hydrALAZINE (APRESOLINE) 50 MG tablet Take 1 tablet (50 mg total) by mouth every 8 (eight) hours. 04/27/15   Donzetta Starch, NP  insulin aspart (NOVOLOG) 100 UNIT/ML injection Inject 0-20 Units into the skin every 4 (four) hours. 04/27/15   Donzetta Starch, NP  insulin aspart (NOVOLOG) 100 UNIT/ML injection Inject 3 Units into the skin every 4 (four) hours. 04/27/15   Donzetta Starch, NP  insulin glargine (LANTUS) 100 UNIT/ML injection Inject 24 Units into the skin 2 (two) times daily. Hold if NPO   Yes Historical Provider, MD  insulin  lispro (HUMALOG) 100 UNIT/ML injection Inject 0-16 Units into the skin 3 (three) times daily before meals. Sliding scale at [facility]: CBG 75-150 = none; CBG 151-200 = 4 units; CBG 201-250 = 8 units; CBG 251-300 = 10 units; CBG 301-350 = 12 units; CBG 351-400 = 16 units; CBG >400 = 16 units and call MD and obtain stat lab verification   Yes Historical Provider, MD  ipratropium-albuterol (DUONEB) 0.5-2.5 (3) MG/3ML SOLN Take 3 mLs by nebulization every 6 (six) hours as needed (for shortness of breath or wheezing).   Yes Historical Provider, MD  lisinopril (PRINIVIL,ZESTRIL) 10 MG tablet Take 10 mg by mouth 2 (two) times daily.   Yes Historical Provider, MD  lisinopril (PRINIVIL,ZESTRIL) 20 MG  tablet Take 1 tablet (20 mg total) by mouth 2 (two) times daily. 04/27/15   Donzetta Starch, NP  Melatonin 3 MG TABS Take 6 mg by mouth at bedtime.   Yes Historical Provider, MD  Multiple Vitamins-Minerals (MULTIVITAMINS THER. W/MINERALS) TABS tablet Take 1 tablet by mouth daily.   Yes Historical Provider, MD  nicotine (NICODERM CQ - DOSED IN MG/24 HR) 7 mg/24hr patch Place 7 mg onto the skin daily.   Yes Historical Provider, MD  nitroGLYCERIN (NITROSTAT) 0.4 MG SL tablet Place 0.4 mg under the tongue every 5 (five) minutes as needed for chest pain.   Yes Historical Provider, MD  Nutritional Supplements (FEEDING SUPPLEMENT, VITAL AF 1.2 CAL,) LIQD Place 1,000 mLs into feeding tube continuous. 04/27/15   Donzetta Starch, NP  ondansetron (ZOFRAN) 4 MG tablet Take 4 mg by mouth every 6 (six) hours as needed for nausea or vomiting.   Yes Historical Provider, MD  ondansetron (ZOFRAN) 4 MG/2ML SOLN injection Inject 4 mg into the vein every 6 (six) hours as needed for nausea or vomiting.   Yes Historical Provider, MD  pantoprazole sodium (PROTONIX) 40 mg/20 mL PACK Place 20 mLs (40 mg total) into feeding tube daily. 04/27/15   Donzetta Starch, NP  potassium chloride 20 MEQ/15ML (10%) SOLN Place 15 mLs (20 mEq total) into feeding tube 2  (two) times daily. 04/27/15   Donzetta Starch, NP  pravastatin (PRAVACHOL) 40 MG tablet Take 40 mg by mouth daily.    Historical Provider, MD  senna-docusate (SENNA PLUS) 8.6-50 MG tablet Take 1 tablet by mouth 2 (two) times daily as needed for mild constipation.   Yes Historical Provider, MD  sennosides (SENOKOT) 8.8 MG/5ML syrup Place 5 mLs into feeding tube 2 (two) times daily as needed for mild constipation. 04/27/15   Donzetta Starch, NP  sodium chloride 0.9 % infusion Inject 1,000 mLs into the vein continuous. 04/27/15   Donzetta Starch, NP  sodium chloride 0.9 % injection 10-40 mLs by Intracatheter route every 12 (twelve) hours. 04/27/15   Donzetta Starch, NP  sodium chloride 0.9 % injection 10-40 mLs by Intracatheter route as needed (flush). 04/27/15   Donzetta Starch, NP  thiamine 100 MG tablet Take 100 mg by mouth daily.   Yes Historical Provider, MD  vancomycin (VANCOCIN) 1 GM/200ML SOLN Inject 200 mLs (1,000 mg total) into the vein every 8 (eight) hours. Patient not taking: Reported on 05/16/2015 04/28/15   Donzetta Starch, NP  zinc sulfate 220 MG capsule Take 220 mg by mouth daily at 6 PM.   Yes Historical Provider, MD    Scheduled Meds: . amLODipine  5 mg Oral Daily  . amoxicillin  250 mg Oral 3 times per day  . antiseptic oral rinse  7 mL Mouth Rinse BID  . atorvastatin  20 mg Oral q1800  . cloNIDine  0.1 mg Oral BID  . clopidogrel  75 mg Oral Daily  . enoxaparin (LOVENOX) injection  40 mg Subcutaneous Q24H  . feeding supplement (PRO-STAT SUGAR FREE 64)  30 mL Oral TID BM  . free water  150 mL Per Tube TID WC & HS  . [START ON 06/06/2015] glipiZIDE  5 mg Oral QAC breakfast  . hydrALAZINE  20 mg Oral 3 times per day  . hydrocerin   Topical BID  . insulin aspart  0-5 Units Subcutaneous QHS  . insulin aspart  0-9 Units Subcutaneous TID WC  . insulin glargine  12  Units Subcutaneous BID  . lisinopril  10 mg Oral BID  . multivitamin with minerals  1 tablet Oral Daily  . nicotine  7 mg  Transdermal Daily  . pantoprazole (PROTONIX) IV  40 mg Intravenous Q12H  . PARoxetine  10 mg Per Tube Daily  . sodium chloride  1 spray Each Nare TID  . thiamine  100 mg Oral Daily  . traMADol  25 mg Oral 4 times per day  . zinc sulfate  220 mg Oral BID   Continuous Infusions: . 0.9 % NaCl with KCl 20 mEq / L     PRN Meds:.acetaminophen (TYLENOL) oral liquid 160 mg/5 mL, acetaminophen, alum & mag hydroxide-simeth, bisacodyl, diphenhydrAMINE, guaiFENesin-dextromethorphan, prochlorperazine **OR** prochlorperazine **OR** prochlorperazine, RESOURCE THICKENUP CLEAR, silver nitrate applicators, sodium phosphate, traZODone  Allergies as of 05/16/2015 - Review Complete 05/16/2015  Allergen Reaction Noted  . Codeine  05/16/2015  . Oxycodone Nausea Only 09/13/2011  . Oxycontin [oxycodone hcl] Nausea Only 11/08/2012    Family History  Problem Relation Age of Onset  . Cancer Mother     Breast cancer (right)  . Diabetes Sister   . Cancer Maternal Aunt     Breast cancer  . Cancer Maternal Aunt     Breast cancer  . Cancer Cousin     Breast Cancer    Social History   Social History  . Marital Status: Married    Spouse Name: N/A  . Number of Children: N/A  . Years of Education: N/A   Occupational History  . Not on file.   Social History Main Topics  . Smoking status: Current Every Day Smoker -- 0.25 packs/day  . Smokeless tobacco: Never Used  . Alcohol Use: Yes     Comment: Social drinker  . Drug Use: No     Comment: Tried crack cocaine and marijuana in the past  . Sexual Activity: No   Other Topics Concern  . Not on file   Social History Narrative    Review of Systems: All negative except as stated above in HPI.  Physical Exam: Vital signs: Filed Vitals:   06/04/15 0800 06/04/15 1551  BP: 136/74 168/72  Pulse: 96 110  Temp:  98.1 F (36.7 C)  Resp:  18   Last BM Date: 06/04/15 General:   Aphasic, Alert,  Loud exhalations, attempts to follow commands HEENT:  anicteric sclera Neck: supple, nontender Lungs:  Clear throughout to auscultation.   No wheezes, crackles, or rhonchi. No acute distress. Heart:  Regular rate and rhythm; no murmurs, clicks, rubs,  or gallops. Abdomen: soft, nontender, nondistended, +BS  Rectal:  Deferred Ext: no edema   GI:  Lab Results:  Recent Labs  06/04/15 1554  WBC 8.7  HGB 11.7*  HCT 37.1  PLT 242   BMET  Recent Labs  06/04/15 1630  NA 139  K 3.3*  CL 100*  CO2 29  GLUCOSE 111*  BUN 12  CREATININE 0.54  CALCIUM 9.6   LFT No results for input(s): PROT, ALBUMIN, AST, ALT, ALKPHOS, BILITOT, BILIDIR, IBILI in the last 72 hours. PT/INR No results for input(s): LABPROT, INR in the last 72 hours.   Studies/Results: Dg Abd Portable 1v  06/04/2015  CLINICAL DATA:  Hematemesis. EXAM: PORTABLE ABDOMEN - 1 VIEW COMPARISON:  04/29/2015 FINDINGS: No evidence of bowel obstruction or ileus. Tube overlying the right abdomen appears to represent a gastrostomy tube. The region of the stomach is cut off at the top of the film. IMPRESSION: No evidence  of bowel obstruction or ileus. Electronically Signed   By: Aletta Edouard M.D.   On: 06/04/2015 17:22    Impression/Plan: 59 yo with coffee grounds emesis and coffee grounds through PEG today. Question peptic ulcer disease vs. Gastritis/Esophagitis vs. Blood from epistaxis. Hgb stable. Would hold off on EGD unless black fluid persisting. Continue Protonix 40 mg IV Q 12 hours. Flush PEG in AM and if black fluid continuing then may need an EGD otherwise would recommend conservative management and avoid invasive endoscopic procedures. Will follow.    LOS: 19 days   [place] C.  06/04/2015, 6:10 PM  Pager [phone]  If no answer or after 5 PM call [phone]

## 2015-06-04 NOTE — Progress Notes (Addendum)
She did not eat lunch and has been nauseous since early this afternoon. Was receiving meds per PEG when she threw up dark liquids. She had dark black emesis in basin and 30 cc of coffee ground fluid aspirated via PEG. Small clot seen in fluid. Fluid positive tested positive on hemoccult card. Patient pale but in NAD. Vitals stable.  Patient nods yes to question of pain and abdomen with mild tenderness.    A/P 1. GIB:  She has had ongoing nose bleed as well as nausea. This is new and likely GI related.  Will check stat CBC/BMET. [facility] consulted for input. D/c Lovenox. Will make patient NPO.  Place saline lock and give dose of IV Protonix as well as compazine. Monitor for now

## 2015-06-04 NOTE — Progress Notes (Signed)
Marlowe [place], PA notified to clarify if we are still giving medications via PEG tube with patients NPO status and possible GI bleed. Ordered to hold all medications tonight, and we will check the residuals in the AM. Ordered to hold lovenox, and monitor BP. Ordered to call Dan back if diastolic goes above [phone]. Will continue to monitor. Kennieth Francois, RN

## 2015-06-04 NOTE — Progress Notes (Signed)
Kerry [name] PHYSICAL MEDICINE & REHABILITATION     PROGRESS NOTE    Subjective/Complaints: Needs help with dressing and transfers. Still severely aphasic and apraxic Cannot voice any complaints  Unable to perform ROS due to aphasia  Objective: Vital Signs: Blood pressure 136/74, pulse 96, temperature 98.1 F (36.7 C), temperature source Oral, resp. rate 18, weight 82.9 kg (182 lb 12.2 oz), SpO2 98 %. No results found. No results for input(s): WBC, HGB, HCT, PLT in the last 72 hours. No results for input(s): NA, K, CL, GLUCOSE, BUN, CREATININE, CALCIUM in the last 72 hours.  Invalid input(s): CO CBG (last 3)   Recent Labs  06/02/15 1146 06/02/15 2139 06/02/15 2230  GLUCAP 88 106* 81    Wt Readings from Last 3 Encounters:  05/23/15 82.9 kg (182 lb 12.2 oz)  05/16/15 86.183 kg (190 lb)  04/27/15 92.5 kg (203 lb 14.8 oz)    Physical Exam:  Constitutional: She appears well-developed and well-nourished. No distress.  Laying in bed and comfortable  HENT: Lurline Idol site is healed Head: Normocephalic and atraumatic.  Mouth/Throat: Oropharynx is   Moist still with white film on tongue   Missing upper teeth and multiple lower teeth.  Eyes: Conjunctivae and EOM are normal. Pupils are equal, round, and reactive to light.  Neck: Normal range of motion. Neck supple.  Cardiovascular: Normal rate and regular rhythm. Exam reveals no friction rub.  Respiratory: Effort normal. No respiratory distress. She makes stridorous sounds when breathing GI: Soft. Bowel sounds are normal. She exhibits no distension. There is no tenderness.  PEG site clean and dry without drainage.  Musculoskeletal: She exhibits no edema or tenderness.  Neurological: She is alert.    Apraxia noted. occasionally with an appropriate y/n nod. 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 No rash noted. She is not diaphoretic. No erythema.  Psychiatric: She is not slowed and not withdrawn. Cognition and memory are impaired. She is noncommunicative. She is inattentive to right.     Assessment/Plan: 1. 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 Physiatrist is providing close team supervision and 24 hour management of active medical problems listed below. Physiatrist and rehab team continue to assess barriers to discharge/monitor patient progress toward functional and medical goals.  Function:  Bathing Bathing position   Position: Shower  Bathing parts Body parts bathed by patient: Chest, Abdomen, Right upper leg, Left upper leg, Front perineal area, Right arm, Right lower leg, Left lower leg Body parts bathed by helper: Right arm, Back, Buttocks  Bathing assist Assist Level: Touching or steadying assistance(Pt > 75%)      Upper Body Dressing/Undressing Upper body dressing   What is the patient wearing?: Pull over shirt/dress     Pull over shirt/dress - Perfomed by patient: Thread/unthread left sleeve, Put head through opening, Pull shirt over trunk Pull over shirt/dress - Perfomed by helper: Thread/unthread right sleeve        Upper body assist Assist Level: Touching or steadying assistance(Pt > 75%)      Lower Body Dressing/Undressing Lower body dressing   What is the patient wearing?: Pants, Non-skid slipper socks     Pants-  Performed by patient: Thread/unthread left pants leg Pants- Performed by helper: Pull pants up/down, Thread/unthread right pants leg Non-skid slipper socks- Performed by patient: Don/doff right sock Non-skid slipper socks- Performed by helper: Don/doff right sock, Don/doff left sock Socks - Performed by patient:  Don/doff right sock Socks - Performed by helper: Don/doff left sock Shoes - Performed by patient: Don/doff left shoe Shoes - Performed by helper: Don/doff right shoe, Don/doff left shoe          Lower body assist Assist for lower body dressing: 2 Helpers      Naval architect activity did not occur: No continent bowel/bladder event   Toileting steps completed by helper: Adjust clothing prior to toileting, Performs perineal hygiene, Adjust clothing after toileting    Toileting assist Assist level: Touching or steadying assistance (Pt.75%)   Transfers Chair/bed transfer   Chair/bed transfer method: Squat pivot Chair/bed transfer assist level: Moderate assist (Pt 50 - 74%/lift or lower) Chair/bed transfer assistive device: Armrests     Locomotion Ambulation Ambulation activity did not occur: Safety/medical concerns   Max distance: 20 Assist level: 2 helpers   Oceanographer activity did not occur: Safety/medical concerns (pt in tilt-in-space w/c, unable to propel) Type: Manual Max wheelchair distance: 50 Assist Level: Moderate assistance (Pt 50 - 74%)  Cognition Comprehension Comprehension assist level: Understands basic 50 - 74% of the time/ requires cueing 25 - 49% of the time  Expression Expression assist level: Expresses basic 25 - 49% of the time/requires cueing 50 - 75% of the time. Uses single words/gestures.  Social Interaction Social Interaction assist level: Interacts appropriately 50 - 74% of the time - May be physically or verbally inappropriate.  Problem Solving Problem solving assist level: Solves basic 50 - 74% of the time/requires cueing 25 - 49% of the time  Memory Memory assist level: Recognizes or recalls less than 25% of the time/requires cueing greater than 75% of the time   Medical Problem List and Plan: 1. Functional deficits secondary to Left MCA infarct, Right hemiplegia, apraxia, aphasia, no motor recovery noted thus  far  -continue CIR therapies 2. DVT Prophylaxis/Anticoagulation: Pharmaceutical: Lovenox 3. Chronic DDD/Fibromyalgia/Pain Management: Used oxycodone prn at home per family. Continue tylenol prn for now  -appears comfortable at present 4. Mood: LCSW to follow for evaluation and support as mentation improves.  5. Neuropsych: This patient is not capable of making decisions on her own behalf. 6. Skin/Wound Care: Routine pressure relief measures  -ocean nasal spray for dry nasal mucosa 7. Fluids/Electrolytes/Nutrition:eating quite well with supervision, By mouth intake remains variable, fluid intake poor at <300 mL per day, intake 15-50% of meals  -continue H20 flushes through PEG.Will increase volume, monitor for fluid overload 8. HTN: Monitor BP bid. On Lisinopril, hydralazine and catapres. reduce amlodipine to 5 mg, reduce Catapres to 0.1 mg, BP 136/74 this am, cont current meds 9. DM type 2: Family reports non-compliance with insulin at home. Will monitor BS ac/hs. Continue lantus bid with glipizide BID. Use SSI for elevated BS  -good control at present.cbg's reviewed CBG, well controlled at present,98 this am, monitor hypoglycemia 10. Anxiety disorder: Used Paxil and ativan at home. Monitor as awareness improves.   -resumed low dose paxil--follow up with family about home dose 11. Acute pre-renal azotemia: increased water flushes to qid. BUN stable at 23. Will need to try off PEG flushes prior to d/c and see if pt can maintain hydration po, not ready for this yet especially since she has UTI  and requires hydration   14. Constipation resolved, daily cont BMs 15. Urinary retention which is intermittent. Patient will need to try voiding on a toilet with assistance, Escherichia coli UTI, start Amoxil 16.  severe dysphagia status post stroke, Continue honey thick liquids, monitor for aspiration, Supplemental water boluses,Recheck MBS this week to see if fluids can be upgraded LOS (Days) 19 A  FACE TO FACE EVALUATION WAS PERFORMED  Kerry [name] E 06/04/2015 11:15 AM

## 2015-06-04 NOTE — Progress Notes (Signed)
Occupational Therapy Weekly Progress Note  Patient Details  Name: Kerry [name] MRN: [id number] Date of Birth: May 13, 1956  Beginning of progress report period: May 25, 2015 End of progress report period: June 04, 2015  Today's Date: 06/04/2015 OT Individual Time: 1110-1207 OT Individual Time Calculation (min): 57 min    Patient has met 2 of 4 short term goals.  Pt is making slow progress towards goals.  Pt currently mod assist with squat pivot transfers, continues to require cues for sequencing and lifting assist.  Pt fluctuates with participation with dressing tasks, tending to be more motivated seated at EOB or in w/c due to increased familiarity of task when compared to dressing at bed level.  Pt continues to require assistance for RUE and RLE management due to decreased mobility and inattention.    Patient continues to demonstrate the following deficits: activity tolerance, balance, postural control, ability to compensate for deficits, functional use ofright upper extremity and right lower extremity, attention, awareness and coordination and therefore will continue to benefit from skilled OT intervention to enhance overall performance with BADL and Reduce care partner burden.  Patient progressing toward long term goals..  Plan of care revisions: downgraded transfers to mod assist.  OT Short Term Goals Week 2:  OT Short Term Goal 1 (Week 2): Pt will complete bathing with min assist at sit > stand level OT Short Term Goal 1 - Progress (Week 2): Met OT Short Term Goal 2 (Week 2): Pt will complete LB dressing with mod assist at sit > stand level OT Short Term Goal 2 - Progress (Week 2): Progressing toward goal OT Short Term Goal 3 (Week 2): Pt will complete toilet transfer to regular commode with mod assist of 1 caregiver OT Short Term Goal 3 - Progress (Week 2): Met OT Short Term Goal 4 (Week 2): Pt will complete 1 grooming task in standing with min assist for standing  balance OT Short Term Goal 4 - Progress (Week 2): Not met Week 3:  OT Short Term Goal 1 (Week 3): STG = LTGs due to remaining LOS  Skilled Therapeutic Interventions/Progress Updates:    Treatment session with focus on hands on family education with pt's husband.  Pt's husband with multiple questions about d/c planning and necessary equipment.  Focus of session on bathroom transfers and equipment.  Therapist demonstrated proper hand placement and technique with squat pivot transfer to tub bench in ADL apt, pt requiring mod assist and improved initiation and weight shifting.  Pt's husband watched transfers x2 then performed 3rd transfer in/out of shower with mod cues for hand placement and technique.  Upon exiting shower 3rd time, pt vomited.  Pt's husband reports she might have over done it.  Returned to room and transferred back to bed via pt request.  Notified RN of vomiting and pt beginning to perseverate on incontinence brief, although declining use of toilet.  Therapy Documentation Precautions:  Precautions Precautions: Fall Precaution Comments: Peg, Rt shoulder subluxation Restrictions Weight Bearing Restrictions: No Pain: Pain Assessment Pain Assessment: Faces Faces Pain Scale: No hurt  See Function Navigator for Current Functional Status.   Therapy/Group: Individual Therapy  Simonne Come 06/04/2015, 12:22 PM

## 2015-06-04 NOTE — Plan of Care (Signed)
Problem: RH BLADDER ELIMINATION Goal: RH STG MANAGE BLADDER WITH ASSISTANCE STG Manage Bladder With max Assistance  Outcome: Not Progressing Total A

## 2015-06-04 NOTE — Plan of Care (Signed)
Problem: RH BOWEL ELIMINATION Goal: RH STG MANAGE BOWEL WITH ASSISTANCE STG Manage Bowel with max Assistance.  Outcome: Not Progressing Incontinent of stool. Total assist with clean up and hygiene Goal: RH STG MANAGE BOWEL W/MEDICATION W/ASSISTANCE STG Manage Bowel with Medication with minimal Assistance.  Outcome: Not Progressing Total assist  Problem: RH BLADDER ELIMINATION Goal: RH STG MANAGE BLADDER WITH ASSISTANCE STG Manage Bladder With max Assistance  Outcome: Not Progressing Incontinent of urine and still requiring I & O cath for urinary retention.   Problem: RH SKIN INTEGRITY Goal: RH STG SKIN FREE OF INFECTION/BREAKDOWN Skin will remain free from infection/ breakdown while on rehab moderate assistance  Outcome: Not Progressing Total assist Goal: RH STG MAINTAIN SKIN INTEGRITY WITH ASSISTANCE STG Maintain Skin Integrity With mod Assistance.  Outcome: Not Progressing Total assit.

## 2015-06-05 ENCOUNTER — Inpatient Hospital Stay (HOSPITAL_COMMUNITY): Payer: 59 | Admitting: Occupational Therapy

## 2015-06-05 ENCOUNTER — Inpatient Hospital Stay (HOSPITAL_COMMUNITY): Payer: 59 | Admitting: Speech Pathology

## 2015-06-05 ENCOUNTER — Inpatient Hospital Stay (HOSPITAL_COMMUNITY): Payer: 59

## 2015-06-05 ENCOUNTER — Inpatient Hospital Stay (HOSPITAL_COMMUNITY): Payer: 59 | Admitting: *Deleted

## 2015-06-05 DIAGNOSIS — K920 Hematemesis: Secondary | ICD-10-CM

## 2015-06-05 DIAGNOSIS — R110 Nausea: Secondary | ICD-10-CM

## 2015-06-05 LAB — BASIC METABOLIC PANEL
Anion gap: 10 (ref 5–15)
BUN: 8 mg/dL (ref 6–20)
CALCIUM: 9.3 mg/dL (ref 8.9–10.3)
CO2: 29 mmol/L (ref 22–32)
CREATININE: 0.46 mg/dL (ref 0.44–1.00)
Chloride: 99 mmol/L — ABNORMAL LOW (ref 101–111)
GFR calc Af Amer: >60 mL/min (ref >60)
GLUCOSE: 88 mg/dL (ref 65–99)
POTASSIUM: 3.5 mmol/L (ref 3.5–5.1)
SODIUM: 138 mmol/L (ref 135–145)

## 2015-06-05 LAB — CBC
HCT: 37 % (ref 36.0–46.0)
Hemoglobin: 11.7 g/dL — ABNORMAL LOW (ref 12.0–15.0)
MCH: 27.8 pg (ref 26.0–34.0)
MCHC: 31.6 g/dL (ref 30.0–36.0)
MCV: 87.9 fL (ref 78.0–100.0)
PLATELETS: 251 10*3/uL (ref 150–400)
RBC: 4.21 MIL/uL (ref 3.87–5.11)
RDW: 14.6 % (ref 11.5–15.5)
WBC: 7.6 10*3/uL (ref 4.0–10.5)

## 2015-06-05 MED ORDER — INSULIN GLARGINE 100 UNIT/ML ~~LOC~~ SOLN
12.0000 [IU] | Freq: Two times a day (BID) | SUBCUTANEOUS | Status: DC
  Administered 2015-06-05: 12 [IU] via SUBCUTANEOUS
  Filled 2015-06-05 (×4): qty 0.12

## 2015-06-05 MED ORDER — JEVITY 1.2 CAL PO LIQD
237.0000 mL | Freq: Three times a day (TID) | ORAL | Status: DC | PRN
  Administered 2015-06-05: 237 mL

## 2015-06-05 MED ORDER — CLONIDINE HCL 0.1 MG/24HR TD PTWK
0.1000 mg | MEDICATED_PATCH | TRANSDERMAL | Status: DC
  Administered 2015-06-05: 0.1 mg via TRANSDERMAL
  Filled 2015-06-05 (×2): qty 1

## 2015-06-05 MED ORDER — PAROXETINE HCL 10 MG PO TABS
10.0000 mg | ORAL_TABLET | Freq: Every day | ORAL | Status: DC
  Administered 2015-06-05 – 2015-06-07 (×3): 10 mg
  Filled 2015-06-05 (×4): qty 1

## 2015-06-05 MED ORDER — CEFAZOLIN SODIUM 1-5 GM-% IV SOLN
1.0000 g | Freq: Three times a day (TID) | INTRAVENOUS | Status: DC
  Administered 2015-06-05 – 2015-06-07 (×7): 1 g via INTRAVENOUS
  Filled 2015-06-05 (×11): qty 50

## 2015-06-05 MED ORDER — ADULT MULTIVITAMIN W/MINERALS CH
1.0000 | ORAL_TABLET | Freq: Every day | ORAL | Status: DC
  Administered 2015-06-05 – 2015-06-07 (×3): 1 via ORAL
  Filled 2015-06-05 (×3): qty 1

## 2015-06-05 MED ORDER — JEVITY 1.2 CAL PO LIQD
ORAL | Status: AC
  Filled 2015-06-05: qty 237

## 2015-06-05 MED ORDER — INSULIN GLARGINE 100 UNIT/ML ~~LOC~~ SOLN: 12.0000 [IU] | Freq: Two times a day (BID) | SUBCUTANEOUS | Status: DC

## 2015-06-05 MED ORDER — AMLODIPINE BESYLATE 5 MG PO TABS
5.0000 mg | ORAL_TABLET | Freq: Every day | ORAL | Status: DC
  Administered 2015-06-05 – 2015-06-06 (×2): 5 mg via ORAL
  Filled 2015-06-05 (×2): qty 1

## 2015-06-05 NOTE — Progress Notes (Signed)
Speech Language Pathology Daily Session Note  Patient Details  Name: Kerry [name] MRN: [id number] Date of Birth: 05/23/1956  Today's Date: 06/05/2015 SLP Individual Time: [id number] SLP Individual Time Calculation (min): 30 min  Short Term Goals: Week 3: SLP Short Term Goal 1 (Week 3): Pt will consume trials of dys 2 textures with min verbal cues for rate and portion control over 2 consecutive sessions prior to advancement  SLP Short Term Goal 2 (Week 3): Patient will vocalize in 50% of opportunities with Max assit multimodal cues   SLP Short Term Goal 3 (Week 3): Patient will follow 1-step directions during familiar tasks with Mod assist gesture cues SLP Short Term Goal 4 (Week 3): Pt will match object to phrase from a field of three with mod assist visual cues.   SLP Short Term Goal 5 (Week 3): Pt will consume trials of thin liquids with minimal overt s/s of aspiration and supervision cues for use of swallowing precautions over 3 targeted sessions.   SLP Short Term Goal 5 - Progress (Week 3): Other (comment) (revised due to pt's preference of thin liquids versus ice chips )  Skilled Therapeutic Interventions: Pt was seen for skilled ST targeting communication goals.  Upon arrival, pt was reclined in bed, awake, but lethargic.  SLP noted that pt's rate of breathing was increased from her typical baseline and pt appeared restless and uncomfortable.  Per report from RN and PA, pt had not eaten anything since yesterday due to nausea, vomiting, and NPO status this AM and her blood pressure had been elevated today.  Pt also just recently completed OT therapy session and seemed fatigued.  SLP attempted to offer pt presentations of her currently prescribed diet but pt adamantly declined purees, she did take ~3 sips of honey thick liquids.  Pt perseverated on pointing to bed rail and vocalizing "bat."  Pt required mod assist verbal cues to accurately answer immediate yes/no questions to indicate  that she wanted to recline her bed back.  Pt also required max assist verbal cues to recognize and correct perseverative verbal errors when requesting assistance for use of TV remote to increase volume.  Pt was left in bed with call bell left within reach.  Pt missed 15 minutes due to illness, fatigue, and pt's unwillingness to participate.  Continue per current plan of care.     Function:  Eating Eating               Cognition Comprehension Comprehension assist level: Understands basic 50 - 74% of the time/ requires cueing 25 - 49% of the time  Expression   Expression assist level: Expresses basic 25 - 49% of the time/requires cueing 50 - 75% of the time. Uses single words/gestures.  Social Interaction Social Interaction assist level: Interacts appropriately 50 - 74% of the time - May be physically or verbally inappropriate.  Problem Solving Problem solving assist level: Solves basic 25 - 49% of the time - needs direction more than half the time to initiate, plan or complete simple activities  Memory Memory assist level: Recognizes or recalls 25 - 49% of the time/requires cueing 50 - 75% of the time    Pain Pain Assessment Pain Assessment: Faces Faces Pain Scale: Hurts little more Pain Location: Generalized Pain Descriptors / Indicators: Discomfort Pain Intervention(s): RN made aware  Therapy/Group: Individual Therapy  Dominyk Law, Selinda Orion 06/05/2015, 7:33 PM

## 2015-06-05 NOTE — Progress Notes (Signed)
Occupational Therapy Session Note  Patient Details  Name: Kerry [name] MRN: [id number] Date of Birth: 09-18-1955  Today's Date: 06/05/2015 OT Individual Time: [id number] OT Individual Time Calculation (min): 56 min   Skilled Therapeutic Interventions/Progress Updates:    Pt completed bathing supine to sitting during session.  Pt with labored breathing during session.  Worked on rolling in the bed to begin session in order for her to complete washing front peri area.  Noted brief completely soaked as well.  Pt needing mod assist for rolling to the left side and only min guard using rail to roll to the right.  Pt with bowel incontinence as well when rolling to her side.  Therapist donned new brief and had pt sit EOB.  Noted pt not feeling well via report from nursing.  Pt sat EOB for 10 mins while completing UB bathing.  Pt became much more anxious and wanting to lay down by her actions and pointing to the bed, as pt is expressively aphasic.  Finished bathing in bed with therapist assisting with washing LEs.  BP taken as pt very anxious with increased respirations at 25 and continued to repeatedly state "back" and point to the left side of the bed or bedrail.  BP 239/136 with automatic cuff and 200/90 manually.  Pt needing max support to slow breathing down but still perseverating on "back"  Pt left in bed with nursing present to administer medications.    Therapy Documentation Precautions:  Precautions Precautions: Fall Precaution Comments: Peg, Rt shoulder subluxation Restrictions Weight Bearing Restrictions: No  Vital Signs: Therapy Vitals Temp: 99.8 F (37.7 C) Temp Source: Oral Pulse Rate: (!) 148 (RN notified) Resp: (!) 26 BP: (!) 165/90 mmHg Patient Position (if appropriate): Lying Oxygen Therapy SpO2: 94 % O2 Device: Not Delivered Pain: Pain Assessment Pain Assessment: Faces Faces Pain Scale: No hurt ADL: See Function Navigator for Current Functional  Status.   Therapy/Group: Individual Therapy  Jahmya Onofrio OTR/L 06/05/2015, 4:12 PM

## 2015-06-05 NOTE — Progress Notes (Signed)
0600 medications moved to 0800 per Marlowe [place], PA due to patient being NPO. He wants to make sure pt is not going to have a procedure this morning before we give medications. Kennieth Francois, RN

## 2015-06-05 NOTE — Progress Notes (Signed)
Social Work Patient ID: Kerry [name], female   DOB: [birth year], 59 y.o.   MRN: [id number] Team feels pt may need an extension due to missed therapies due to not feeling well or throwing up. Will discuss in team conference tomorrow.

## 2015-06-05 NOTE — Progress Notes (Signed)
Nutrition Follow-up  DOCUMENTATION CODES:   Not applicable  INTERVENTION:  Let patient attempt to eat at meals, if po intake at that meal is <50%, administer a bolus feeds of Jevity 1.2 formula via PEG at a volume of 237 ml (1 can) up to 3 times daily.  Each bolus feed provides 284 kcal, 13 grams of protein, and 192 ml of free water.   Continue 30 ml Prostat po TID,each supplement provides 100 kcal and 15 grams of protein.   Encourage adequate PO intake.   Continue free water flushes.   NUTRITION DIAGNOSIS:   Increased nutrient needs related to  (increased energy expenditure with participation of therapies ) as evidenced by estimated needs; ongoing  GOAL:   Patient will meet greater than or equal to 90% of their needs; not met  MONITOR:   PO intake, Supplement acceptance, Diet advancement, Weight trends, Labs, I & O's  REASON FOR ASSESSMENT:   Consult Calorie Count  ASSESSMENT:   Pt admitted after MCA stroke. PEG placed during hospitalization.   RD consulted to order bolus tube feeds PRN when po intake at meals are poor. Meal completion has been varied from 0-85% with most meals at 40-50%. Of note, pt became nauseous and vomited black emesis yesterday with 80 ml of black fluid from PEG tube. No vomiting since then. No blood or coffee grounds when PEG flushed since then. Diet has been advanced from NPO this AM. Bolus tube feeding orders have been ordered for PRN. RD to additionally continue with Prostat as pt has been consuming them. RD to continue to monitor.   Diet Order:  DIET - DYS 1 Room service appropriate?: Yes; Fluid consistency:: Honey Thick  Skin:  Reviewed, no issues  Last BM:  12/12  Height:   Ht Readings from Last 1 Encounters:  05/16/15 5' 10"  (1.778 m)    Weight:   Wt Readings from Last 1 Encounters:  05/23/15 182 lb 12.2 oz (82.9 kg)    Ideal Body Weight:  68.1 kg  BMI:  Body mass index is 26.22 kg/(m^2).  Estimated Nutritional Needs:    Kcal:  1800-2000  Protein:  85-95 grams  Fluid:  1.7 - 1.9 L/day  EDUCATION NEEDS:   No education needs identified at this time  Corrin Parker, MS, RD, LDN Pager # [phone] After hours/ weekend pager # [phone]

## 2015-06-05 NOTE — Progress Notes (Signed)
[place], PA to clarify medication's he wanted held tonight. He said to give her IV protonix, and to hold the amoxicillin. Kennieth Francois, RN

## 2015-06-05 NOTE — Progress Notes (Signed)
Speech Language Pathology Daily Session Note  Patient Details  Name: Kerry [name] MRN: [id number] Date of Birth: Mar 03, 1956  Today's Date: 06/05/2015 SLP Individual Time: [id number] SLP Individual Time Calculation (min): 43 min  Short Term Goals: Week 3: SLP Short Term Goal 1 (Week 3): Pt will consume trials of dys 2 textures with min verbal cues for rate and portion control over 2 consecutive sessions prior to advancement  SLP Short Term Goal 2 (Week 3): Patient will vocalize in 50% of opportunities with Max assit multimodal cues   SLP Short Term Goal 3 (Week 3): Patient will follow 1-step directions during familiar tasks with Mod assist gesture cues SLP Short Term Goal 4 (Week 3): Pt will match object to phrase from a field of three with mod assist visual cues.   SLP Short Term Goal 5 (Week 3): Pt will consume trials of thin liquids with minimal overt s/s of aspiration and supervision cues for use of swallowing precautions over 3 targeted sessions.   SLP Short Term Goal 5 - Progress (Week 3): Other (comment) (revised due to pt's preference of thin liquids versus ice chips )  Skilled Therapeutic Interventions:  Pt was seen for skilled ST targeting goals for communication and dysphagia.  Plans were initially set for repeat MBS; however, pt is currently undergoing medical work up for questionable GI bleed and is NPO for possible procedure.   PA did inform SLP halfway through today's therapy session that pt may resume POs; therefore, SLP facilitated the session with trials of regular, unthickened water via cup sips following thorough oral care.  Pt demonstrated delayed cough following 1 out of 6 trials.  Pt's swallow appeared delayed and was audible throughout all trials regardless of whether pt demonstrated overt s/s of aspiration.  Recommend repeat objective assessment at next available appointment to objectively determine readiness to advance.  Pt was able to complete 2 distinct sets of  automatic sequences within familiar songs with overall mod assist verbal cues to recognize and correct verbal errors.  Pt was also able to answer basic, immediate yes/no questions with the use of a visual aid with mod assist verbal and visual cues to alternate between yes/no responses.  Pt was noted with greater variation in jargon in comparison to previous therapy session and more attempts to correct verbal errors; however, her production of real word utterances remains limited.  Pt was left in bed, handed off to transport for stat chest xray per RN.  Continue per current plan of care.   Function:  Eating Eating   Modified Consistency Diet: Yes Eating Assist Level: Supervision or verbal cues;Set up assist for   Eating Set Up Assist For: Opening containers       Cognition Comprehension Comprehension assist level: Understands basic 50 - 74% of the time/ requires cueing 25 - 49% of the time  Expression   Expression assist level: Expresses basic 25 - 49% of the time/requires cueing 50 - 75% of the time. Uses single words/gestures.  Social Interaction Social Interaction assist level: Interacts appropriately 50 - 74% of the time - May be physically or verbally inappropriate.  Problem Solving Problem solving assist level: Solves basic 25 - 49% of the time - needs direction more than half the time to initiate, plan or complete simple activities  Memory Memory assist level: Recognizes or recalls 25 - 49% of the time/requires cueing 50 - 75% of the time    Pain Pain Assessment Pain Assessment: No/denies pain Faces Pain Scale:  Hurts a little bit  Therapy/Group: Individual Therapy  Kerry [name], Selinda Orion 06/05/2015, 12:10 PM

## 2015-06-05 NOTE — Progress Notes (Addendum)
Patient ID: AURBREE EURE, female   DOB: 02-16-1956, 59 y.o.   MRN: [id number] [facility] Gastroenterology Progress Note  DANNIEL VANDEVOORDE 59 y.o. Mar 10, 1956    No further bleeding from PEG tube. Nurse at bedside.  Objective: Vital signs in last 24 hours: Filed Vitals:   06/05/15 1350 06/05/15 1510  BP: 200/100 165/90  Pulse:  148 (nurse aware)  Temp:  99.8  Resp:  26    Physical Exam: Gen: aphasic, awake HEENT: anicteric CV: RRR Chest: CTA B Abd: soft, nontender, nondistended, +BS  Lab Results:  Recent Labs  06/04/15 1630 06/05/15 0335  NA 139 138  K 3.3* 3.5  CL 100* 99*  CO2 29 29  GLUCOSE 111* 88  BUN 12 8  CREATININE 0.54 0.46  CALCIUM 9.6 9.3   No results for input(s): AST, ALT, ALKPHOS, BILITOT, PROT, ALBUMIN in the last 72 hours.  Recent Labs  06/04/15 2139 06/05/15 0335  WBC 8.9 7.6  HGB 11.5* 11.7*  HCT 36.0 37.0  MCV 87.4 87.9  PLT 238 251   No results for input(s): LABPROT, INR in the last 72 hours.    Assessment/Plan: Small amount of coffee grounds from PEG tube and vomitus yesterday in the setting of epistaxis. No evidence of an ongoing bleed and no recurrent black fluid today. Suspect the epistaxis was the source. EGD not needed at this time. Would change to Protonix per G-tube tomorrow. Ok to resume Plavix tomorrow from a GI standpoint. Agree with resuming tube feeds. Will sign off. Call if questions.   Phil Campbell C. 06/05/2015, 4:05 PM  Pager [phone]  If no answer or after 5 PM call [phone]

## 2015-06-05 NOTE — Progress Notes (Signed)
Physical Therapy Session Note  Patient Details  Name: Kerry [name] MRN: [id number] Date of Birth: Jan 14, 1956  Today's Date: 06/05/2015 PT Individual Time: 0800-0835 PT Individual Time Calculation (min): 35 min   Short Term Goals: Week 3:  PT Short Term Goal 1 (Week 3): =LTG due to estimated length of stay  Skilled Therapeutic Interventions/Progress Updates:    Pt received seated in bed with HOB elevated, holding basin in the event of nausea/vomitting. Encouraged pt to participate, cued for dressing however pt uses body language to express that she does not want to put clothes on. BP assessed manually 160/80, HR 108. Rolling R/L to assess continence with minA and bedrails, however pt does not require brief change at this time. Engaged pt with several questions to determine wants/needs using yes/no sign in room, however pt answers "no" to every question including "is your name Coralyn Mark?". Supine>sit modA with bedrails. Seated on EOB again attempted to engage pt in dressing task and pt began reaching for pants with LUE, however MD arrived to assess pt. After sitting on EOB x approximately 2 min pt reaches LUE back toward bed to initiate sit >supine. Assisted to supine position modA. Repositioning in bed with +2A to scoot toward [facility] and pt assisting with LUE on bedrail and LUE bridging. Cued pt in RLE AROM x several trials using multi-modal cues, with no palpable contraction noted. Pt uses hand gestures to indicate desire for lights off, bedrail up. Pt remained seated in bed with HOB elevated, all needs including basin in reach and alarm intact.   Therapy Documentation Precautions:  Precautions Precautions: Fall Precaution Comments: Peg, Rt shoulder subluxation Restrictions Weight Bearing Restrictions: No General: PT Amount of Missed Time (min): 25 Minutes PT Missed Treatment Reason: Other (Comment) (nausea, pt refusal) Pain: Pain Assessment Pain Assessment: Faces Faces Pain Scale: Hurts  a little bit   See Function Navigator for Current Functional Status.   Therapy/Group: Individual Therapy  Luberta Mutter 06/05/2015, 8:42 AM

## 2015-06-05 NOTE — Progress Notes (Signed)
[facility]ICINE & REHABILITATION     PROGRESS NOTE    Subjective/Complaints: Nauseated but no vomiting this am.  No PEG flush done this am, No blood or coffe grounds when flushed last noc Labs reviewed no drop in Hgb  Unable to perform ROS due to aphasia  Objective: Vital Signs: Blood pressure 175/83, pulse 87, temperature 98 F (36.7 C), temperature source Oral, resp. rate 18, weight 82.9 kg (182 lb 12.2 oz), SpO2 98 %. Dg Abd Portable 1v  06/04/2015  CLINICAL DATA:  Hematemesis. EXAM: PORTABLE ABDOMEN - 1 VIEW COMPARISON:  04/29/2015 FINDINGS: No evidence of bowel obstruction or ileus. Tube overlying the right abdomen appears to represent a gastrostomy tube. The region of the stomach is cut off at the top of the film. IMPRESSION: No evidence of bowel obstruction or ileus. Electronically Signed   By: Aletta Edouard M.D.   On: 06/04/2015 17:22    Recent Labs  06/04/15 2139 06/05/15 0335  WBC 8.9 7.6  HGB 11.5* 11.7*  HCT 36.0 37.0  PLT 238 251    Recent Labs  06/04/15 1630 06/05/15 0335  NA 139 138  K 3.3* 3.5  CL 100* 99*  GLUCOSE 111* 88  BUN 12 8  CREATININE 0.54 0.46  CALCIUM 9.6 9.3   CBG (last 3)   Recent Labs  06/02/15 1146 06/02/15 2139 06/02/15 2230  GLUCAP 88 106* 81    Wt Readings from Last 3 Encounters:  05/23/15 82.9 kg (182 lb 12.2 oz)  05/16/15 86.183 kg (190 lb)  04/27/15 92.5 kg (203 lb 14.8 oz)    Physical Exam:  Constitutional: She appears well-developed and well-nourished. No distress.  Laying in bed and comfortable  HENT: Lurline Idol site is healed Head: Normocephalic and atraumatic.  Mouth/Throat: Oropharynx is   Moist still with white film on tongue   Missing upper teeth and multiple lower teeth.  Eyes: Conjunctivae and EOM are normal. Pupils are equal, round, and reactive to light.  Neck: Normal range of motion. Neck supple.  Cardiovascular: Normal rate and regular rhythm. Exam reveals no friction rub.   Respiratory: Effort normal. No respiratory distress. She makes stridorous sounds when breathing GI: Soft. Bowel sounds are normal. She exhibits no distension. There is no tenderness.  PEG site clean and dry without drainage.  Musculoskeletal: She exhibits no edema or tenderness.  Neurological: She is alert.    Apraxia noted. occasionally with an appropriate y/n nod. Poor oral-motor control Inconsistently follows commands    Unable to perform motor and sensory testing due to severe apraxia , Grossly 4/5 in LUE and LLE.No active movement right upper or right lower extremity  DTRs 3+ on right side    Skin: Skin is warm and dry. No rash noted. She is not diaphoretic. No erythema.  Psychiatric: She is not slowed and not withdrawn. Cognition and memory are impaired. She is noncommunicative. She is inattentive to right.     Assessment/Plan: 1. 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 Physiatrist is providing close team supervision and 24 hour management of active medical problems listed below. Physiatrist and rehab team continue to assess barriers to discharge/monitor patient progress toward functional and medical goals.  Function:  Bathing Bathing position   Position: Shower  Bathing parts Body parts bathed by patient: Chest, Abdomen, Right upper leg, Left upper leg, Front perineal area, Right arm, Right lower leg, Left lower leg  Body parts bathed by helper: Right arm, Back, Buttocks  Bathing assist Assist Level: Touching or steadying assistance(Pt > 75%)      Upper Body Dressing/Undressing Upper body dressing   What is the patient wearing?: Pull over shirt/dress     Pull over shirt/dress - Perfomed by patient: Thread/unthread left sleeve, Put head through opening, Pull shirt over trunk Pull over shirt/dress - Perfomed by helper: Thread/unthread right  sleeve        Upper body assist Assist Level: Touching or steadying assistance(Pt > 75%)      Lower Body Dressing/Undressing Lower body dressing   What is the patient wearing?: Pants, Non-skid slipper socks     Pants- Performed by patient: Thread/unthread left pants leg Pants- Performed by helper: Pull pants up/down, Thread/unthread right pants leg Non-skid slipper socks- Performed by patient: Don/doff right sock Non-skid slipper socks- Performed by helper: Don/doff right sock, Don/doff left sock Socks - Performed by patient: Don/doff right sock Socks - Performed by helper: Don/doff left sock Shoes - Performed by patient: Don/doff left shoe Shoes - Performed by helper: Don/doff right shoe, Don/doff left shoe          Lower body assist Assist for lower body dressing: 2 Helpers      Naval architect activity did not occur: No continent bowel/bladder event   Toileting steps completed by helper: Adjust clothing prior to toileting, Performs perineal hygiene, Adjust clothing after toileting    Toileting assist Assist level: Touching or steadying assistance (Pt.75%)   Transfers Chair/bed transfer   Chair/bed transfer method: Squat pivot Chair/bed transfer assist level: Moderate assist (Pt 50 - 74%/lift or lower) Chair/bed transfer assistive device: Armrests     Locomotion Ambulation Ambulation activity did not occur: Safety/medical concerns   Max distance: 20 Assist level: 2 helpers   Oceanographer activity did not occur: Safety/medical concerns (pt in tilt-in-space w/c, unable to propel) Type: Manual Max wheelchair distance: 50 Assist Level: Moderate assistance (Pt 50 - 74%)  Cognition Comprehension Comprehension assist level: Understands basic 50 - 74% of the time/ requires cueing 25 - 49% of the time  Expression Expression assist level: Expresses basic 25 - 49% of the time/requires cueing 50 - 75% of the time. Uses single words/gestures.  Social  Interaction Social Interaction assist level: Interacts appropriately 50 - 74% of the time - May be physically or verbally inappropriate.  Problem Solving Problem solving assist level: Solves basic 25 - 49% of the time - needs direction more than half the time to initiate, plan or complete simple activities  Memory Memory assist level: Recognizes or recalls 25 - 49% of the time/requires cueing 50 - 75% of the time   Medical Problem List and Plan: 1. Functional deficits secondary to Left MCA infarct, Right hemiplegia, apraxia, aphasia, R UE with increased movement today  -continue CIR therapies 2. DVT Prophylaxis/Anticoagulation: Pharmaceutical: Lovenox- held for epistaxis and coffee grounds aspirated from PEG flush 3. Chronic DDD/Fibromyalgia/Pain Management: Used oxycodone prn at home per family. Continue tylenol prn for now  -appears comfortable at present 4. Mood: LCSW to follow for evaluation and support as mentation improves.  5. Neuropsych: This patient is not capable of making decisions on her own behalf. 6. Skin/Wound Care: Routine pressure relief measures  -ocean nasal spray for dry nasal mucosa 7. Fluids/Electrolytes/Nutrition:eating quite well with supervision, By mouth intake remains variable, fluid intake poor at <300 mL per day, intake for meals minimal  -continue H20 flushes through PEG.Will increase volume, monitor for  fluid overload 8. HTN: Monitor BP bid. On Lisinopril, hydralazine and catapres.on hold BP 175/83 this am,given NPO status, will change catapres to trans dermal 9. DM type 2: Family reports non-compliance with insulin at home. Will monitor BS ac/hs. Continue lantus bid with glipizide BID. Use SSI for elevated BS  -good control at present.cbg's reviewed CBG, well controlled at present,98 this am, monitor hypoglycemia 10. Anxiety disorder: Used Paxil and ativan at home. Monitor as awareness improves.   -resumed low dose paxil--follow up with family about home  dose 11. Acute pre-renal azotemia: increased water flushes to qid. BUN stable at 23. Will need to try off PEG flushes prior to d/c and see if pt can maintain hydration po, not ready for this yet especially since she has UTI and requires hydration   14. Constipation resolved, daily cont BMs 15. Urinary retention which is intermittent. Patient will need to try voiding on a toilet with assistance, Escherichia coli UTI, currently NPO, also question if nausea related to Amoxil 16.  severe dysphagia status post stroke, Continue honey thick liquids, monitor for aspiration, Supplemental water boluses,Recheck MBS this week to see if fluids can be upgraded, not ready today because of nausea LOS (Days) 20 A FACE TO FACE EVALUATION WAS PERFORMED  Charlett Blake 06/05/2015 8:38 AM

## 2015-06-05 NOTE — Progress Notes (Signed)
[place], PA and he ordered to hold lovenox for tonight. Kennieth Francois, RN

## 2015-06-05 NOTE — Progress Notes (Signed)
Spoke to [employer], PA to clarify if Catapres patch is to be applied. Also to clarify if nightly dose of Lantus should be given.  Per Pam. Catapres patch is to be applied. Lantus to be held if pt was still NPO. Pt is not NPO, therefore dosage should be administered.

## 2015-06-05 NOTE — Progress Notes (Addendum)
Patient in bed and does not appear in distress. Answered yes to most questions. With Y/N board pointed to yes when questioned on abdominal pain.  Nurse did flush this am and got a couple of dark flecks back. I flushed the PEG tube with 50 cc sterile water --pulled back clear fluid with few dark flecks.  No pain elicited with palpation.    Updated Dr. Michail Sermon who felt that patient could be started on diet/meds as H/H stable and no signs of bleeding noted. To continue IV protonix bid and he will follow up later today. Will hold plavix today.

## 2015-06-06 ENCOUNTER — Telehealth: Payer: Self-pay | Admitting: Registered Nurse

## 2015-06-06 ENCOUNTER — Inpatient Hospital Stay (HOSPITAL_COMMUNITY): Payer: 59 | Admitting: Occupational Therapy

## 2015-06-06 ENCOUNTER — Inpatient Hospital Stay (HOSPITAL_COMMUNITY): Payer: 59 | Admitting: Physical Therapy

## 2015-06-06 ENCOUNTER — Encounter (HOSPITAL_COMMUNITY): Payer: 59 | Admitting: Speech Pathology

## 2015-06-06 ENCOUNTER — Inpatient Hospital Stay (HOSPITAL_COMMUNITY): Payer: 59

## 2015-06-06 ENCOUNTER — Inpatient Hospital Stay (HOSPITAL_COMMUNITY): Payer: 59 | Admitting: Speech Pathology

## 2015-06-06 DIAGNOSIS — R000 Tachycardia, unspecified: Secondary | ICD-10-CM

## 2015-06-06 LAB — GLUCOSE, CAPILLARY
GLUCOSE-CAPILLARY: 102 mg/dL — AB (ref 65–99)
GLUCOSE-CAPILLARY: 106 mg/dL — AB (ref 65–99)
GLUCOSE-CAPILLARY: 118 mg/dL — AB (ref 65–99)
GLUCOSE-CAPILLARY: 135 mg/dL — AB (ref 65–99)
GLUCOSE-CAPILLARY: 141 mg/dL — AB (ref 65–99)
GLUCOSE-CAPILLARY: 184 mg/dL — AB (ref 65–99)
GLUCOSE-CAPILLARY: 197 mg/dL — AB (ref 65–99)
GLUCOSE-CAPILLARY: 70 mg/dL (ref 65–99)
GLUCOSE-CAPILLARY: 99 mg/dL (ref 65–99)
Glucose-Capillary: 85 mg/dL (ref 65–99)
Glucose-Capillary: 88 mg/dL (ref 65–99)
Glucose-Capillary: 70 mg/dL (ref 65–99)
Glucose-Capillary: 100 mg/dL — ABNORMAL HIGH (ref 65–99)
Glucose-Capillary: 101 mg/dL — ABNORMAL HIGH (ref 65–99)
Glucose-Capillary: 115 mg/dL — ABNORMAL HIGH (ref 65–99)
Glucose-Capillary: 148 mg/dL — ABNORMAL HIGH (ref 65–99)
Glucose-Capillary: 156 mg/dL — ABNORMAL HIGH (ref 65–99)
Glucose-Capillary: 182 mg/dL — ABNORMAL HIGH (ref 65–99)
Glucose-Capillary: 175 mg/dL — ABNORMAL HIGH (ref 65–99)

## 2015-06-06 LAB — COMPREHENSIVE METABOLIC PANEL
ALBUMIN: 3.2 g/dL — AB (ref 3.5–5.0)
ALK PHOS: 74 U/L (ref 38–126)
ALT: 25 U/L (ref 14–54)
AST: 20 U/L (ref 15–41)
Anion gap: 12 (ref 5–15)
BILIRUBIN TOTAL: 0.4 mg/dL (ref 0.3–1.2)
BUN: 18 mg/dL (ref 6–20)
CALCIUM: 9.3 mg/dL (ref 8.9–10.3)
CO2: 29 mmol/L (ref 22–32)
CREATININE: 0.53 mg/dL (ref 0.44–1.00)
Chloride: 97 mmol/L — ABNORMAL LOW (ref 101–111)
GFR calc Af Amer: >60 mL/min (ref >60)
GLUCOSE: 171 mg/dL — AB (ref 65–99)
Potassium: 3.1 mmol/L — ABNORMAL LOW (ref 3.5–5.1)
Sodium: 138 mmol/L (ref 135–145)
TOTAL PROTEIN: 7 g/dL (ref 6.5–8.1)

## 2015-06-06 LAB — CBC WITH DIFFERENTIAL/PLATELET
BASOS ABS: 0 10*3/uL (ref 0.0–0.1)
BASOS PCT: 0 %
EOS ABS: 0 10*3/uL (ref 0.0–0.7)
EOS PCT: 0 %
HCT: 42.4 % (ref 36.0–46.0)
Hemoglobin: 13.6 g/dL (ref 12.0–15.0)
Lymphocytes Relative: 26 %
Lymphs Abs: 3.9 10*3/uL (ref 0.7–4.0)
MCH: 27.9 pg (ref 26.0–34.0)
MCHC: 32.1 g/dL (ref 30.0–36.0)
MCV: 86.9 fL (ref 78.0–100.0)
MONO ABS: 1 10*3/uL (ref 0.1–1.0)
Monocytes Relative: 6 %
Neutro Abs: 10.5 10*3/uL — ABNORMAL HIGH (ref 1.7–7.7)
Neutrophils Relative %: 68 %
PLATELETS: 354 10*3/uL (ref 150–400)
RBC: 4.88 MIL/uL (ref 3.87–5.11)
RDW: 14.6 % (ref 11.5–15.5)
WBC: 15.4 10*3/uL — AB (ref 4.0–10.5)

## 2015-06-06 LAB — TROPONIN I
Troponin I: 0.46 ng/mL — ABNORMAL HIGH (ref <0.031)
Troponin I: 0.33 ng/mL — ABNORMAL HIGH (ref <0.031)

## 2015-06-06 LAB — LACTIC ACID, PLASMA: LACTIC ACID, VENOUS: 1.1 mmol/L (ref 0.5–2.0)

## 2015-06-06 LAB — D-DIMER, QUANTITATIVE (NOT AT ARMC): D DIMER QUANT: 0.58 ug{FEU}/mL — AB (ref 0.00–0.50)

## 2015-06-06 MED ORDER — ACETAMINOPHEN 160 MG/5ML PO SOLN: 325.0000 mg | ORAL | Status: DC | PRN

## 2015-06-06 MED ORDER — PANTOPRAZOLE SODIUM 40 MG PO PACK
20.0000 mg | PACK | Freq: Two times a day (BID) | ORAL | Status: DC
  Administered 2015-06-06 – 2015-06-07 (×2): 20 mg
  Filled 2015-06-06 (×2): qty 20

## 2015-06-06 MED ORDER — FREE WATER
200.0000 mL | Freq: Three times a day (TID) | Status: DC
  Administered 2015-06-06: 200 mL

## 2015-06-06 MED ORDER — LORAZEPAM 0.5 MG PO TABS
0.5000 mg | ORAL_TABLET | Freq: Four times a day (QID) | ORAL | Status: DC | PRN
  Administered 2015-06-06: 0.5 mg via ORAL
  Filled 2015-06-06: qty 1

## 2015-06-06 MED ORDER — CLOPIDOGREL BISULFATE 75 MG PO TABS
75.0000 mg | ORAL_TABLET | Freq: Every day | ORAL | Status: DC
  Administered 2015-06-06: 75 mg via ORAL

## 2015-06-06 MED ORDER — PANTOPRAZOLE SODIUM 20 MG PO TBEC: 20.0000 mg | DELAYED_RELEASE_TABLET | Freq: Two times a day (BID) | ORAL | Status: DC

## 2015-06-06 MED ORDER — ONDANSETRON HCL 4 MG PO TABS
4.0000 mg | ORAL_TABLET | ORAL | Status: DC
  Administered 2015-06-06 – 2015-06-07 (×5): 4 mg via ORAL
  Filled 2015-06-06 (×5): qty 1

## 2015-06-06 MED ORDER — HYDROCERIN EX CREA: 1.0000 "application " | TOPICAL_CREAM | Freq: Two times a day (BID) | CUTANEOUS | Status: DC

## 2015-06-06 MED ORDER — SALINE SPRAY 0.65 % NA SOLN: 1.0000 | Freq: Three times a day (TID) | NASAL | Status: DC

## 2015-06-06 MED ORDER — POTASSIUM CHLORIDE CRYS ER 20 MEQ PO TBCR
20.0000 meq | EXTENDED_RELEASE_TABLET | Freq: Two times a day (BID) | ORAL | Status: DC
Start: 2015-06-06 — End: 2015-06-07
  Administered 2015-06-06: 20 meq via ORAL
  Filled 2015-06-06: qty 1

## 2015-06-06 NOTE — Progress Notes (Signed)
Physical Therapy Session Note  Patient Details  Name: Kerry [name] MRN: [id number] Date of Birth: September 25, 1955  Today's Date: 06/06/2015 PT Individual Time: 1030-1100 and 1300-1400 PT Individual Time Calculation (min): 30 min and 60 min (total 90 min)   Short Term Goals: Week 3:  PT Short Term Goal 1 (Week 3): =LTG due to estimated length of stay  Skilled Therapeutic Interventions/Progress Updates:    Tx 1: Pt received seated in bed with family present (husband, son [name], son's fiance). Educated family on bed mobility and transfers w/c <>bed. Therapist demonstrated transfer to w/c, discussed importance of head/hips relationship, RUE management. Husband demonstrated transfer w/c>bed with modA and therapist assist for safety. Seated on EOB x5 min, son asks if transfer board might increase safety and ease of transfer for husband if home alone. While therapist left to retrieve transfer board, pt initiates sit >supine and assisted back to bed modA. Pt declines further tx; left supine with family and RN present and all needs in reach.   Tx 2: Pt received supine in bed with family present, no evidence of pain via FACES scale and agreeable to treatment. Supine>sit with modA and bedrails. Son's fiance Amy performed squat pivot transfer bed >w/c modA. Sit <>stand at sink with Amy providing maxA for donning pants while therapist and pt's LUE on sink providing standing balance. Car transfer with Amy practicing on therapist to allow for therapist to give feedback on hand placement, body mechanics and w/c set-up, as well as allowing pt to rest, observe transfer, and decrease anxiety. Husband transferred pt w/c <>car with mod/maxA. Educated regarding head/hips relationship and importance of forward trunk flexion of pt to A with unweighing hips for increased ease and safety of transfer. Utilized transfer board due to large gap between chair and car. Pt able to assist with weight shifting and elevating LE to A  with placement. Returned to room and husband transferred w/c>bed with modA. Sit>supine modA, husband and Amy provided +2 for scooting in bed. Education throughout session during rest breaks regarding energy conservation, bed and w/c positioning, ramp construction (given handout), equipment, RUE management and safety. Pt remained supine in bed with all needs in reach at completion of session.   Therapy Documentation Precautions:  Precautions Precautions: Fall Precaution Comments: Peg, Rt shoulder subluxation Restrictions Weight Bearing Restrictions: No Pain: Pain Assessment Pain Assessment: Faces Faces Pain Scale: No hurt   See Function Navigator for Current Functional Status.   Therapy/Group: Individual Therapy  Luberta Mutter 06/06/2015, 11:28 AM

## 2015-06-06 NOTE — Plan of Care (Signed)
Pt's plan of care adjusted to 15 hours of therapy over 7 days after speaking with care team and discussed with MD in team conference as pt currently unable to tolerate current therapy schedule with OT, PT, and SLP.

## 2015-06-06 NOTE — Progress Notes (Addendum)
Speech Language Pathology Daily Session Note  Patient Details  Name: Kerry [name] MRN: [id number] Date of Birth: 1956/03/05  Today's Date: 06/06/2015 SLP Individual Time: 1402-1430 SLP Individual Time Calculation (min): 28 min  Short Term Goals: Week 3: SLP Short Term Goal 1 (Week 3): Pt will consume trials of dys 2 textures and nectar thick liquids  with min verbal cues for rate and portion control over 2 consecutive sessions prior to advancement  SLP Short Term Goal 1 - Progress (Week 3): Updated due to goal met SLP Short Term Goal 2 (Week 3): Patient will vocalize in 50% of opportunities with Max assit multimodal cues   SLP Short Term Goal 3 (Week 3): Patient will follow 1-step directions during familiar tasks with Mod assist gesture cues SLP Short Term Goal 4 (Week 3): Pt will match object to phrase from a field of three with mod assist visual cues.   SLP Short Term Goal 5 (Week 3): Pt will consume trials of thin liquids with minimal overt s/s of aspiration and supervision cues for use of swallowing precautions over 3 targeted sessions.   SLP Short Term Goal 5 - Progress (Week 3): Other (comment) (revised due to pt's preference of thin liquids versus ice chips )  Skilled Therapeutic Interventions:  Pt was seen for skilled ST targeting dysphagia goals and family education.  Pt's husband and daughter in law were present during therapy today; therefore, SLP reviewed and reinforced recommendations for currently prescribed diet and rationale behind current swallowing precautions per today's MBS.   SLP completed skilled observations during presentations of pt's currently prescribed diet.  No overt s/s of aspiration were evident with nectar thick liquids or dys 2 textures and pt demonstrated effective mastication and complete clearance of solids from the oral cavity post swallow.  Recommend that pt continue on dys 2 textures and nectar thick liquids, pt's daughter in law and husband now signed  off to supervise pt during meals.  Prior to meal, SLP also facilitated the session with trials of regular water following thorough oral care per the water protocol.  Pt demonstrated multiple swallows but no overt s/s of aspiration with trials.  Pt's family aware of the parameters of the water protocol, including that trials may only occur with full staff supervision.  Throughout the session, pt required max encouragement for participation.  Pt was left in bed with call bell within reach and family at bedside.  Continue per current plan of care.    Function:  Eating Eating   Modified Consistency Diet: Yes Eating Assist Level: Supervision or verbal cues;Set up assist for   Eating Set Up Assist For: Opening containers       Cognition Comprehension Comprehension assist level: Understands basic 50 - 74% of the time/ requires cueing 25 - 49% of the time  Expression   Expression assist level: Expresses basic 25 - 49% of the time/requires cueing 50 - 75% of the time. Uses single words/gestures.  Social Interaction Social Interaction assist level: Interacts appropriately 50 - 74% of the time - May be physically or verbally inappropriate.  Problem Solving Problem solving assist level: Solves basic 25 - 49% of the time - needs direction more than half the time to initiate, plan or complete simple activities  Memory Memory assist level: Recognizes or recalls 25 - 49% of the time/requires cueing 50 - 75% of the time    Pain Pain Assessment Pain Assessment: No/denies pain   Therapy/Group: Individual Therapy  Wacey Zieger, Selinda Orion  06/06/2015, 4:22 PM

## 2015-06-06 NOTE — Progress Notes (Signed)
Occupational Therapy Session Note  Patient Details  Name: Kerry [name] MRN: [id number] Date of Birth: [birth year]  Today's Date: 06/06/2015 OT Individual Time: [id number] OT Individual Time Calculation (min): 35 min    Short Term Goals: Week 3:  OT Short Term Goal 1 (Week 3): STG = LTGs due to remaining LOS  Skilled Therapeutic Interventions/Progress Updates:    ADL retraining with focus on bed mobility and increased participation in self-care tasks.  Pt declined OOB activity this session with constant head shaking "no".  Pt completed perineal hygiene at bed level, rolled Rt with Min guard and Lt with Mod assist while therapist completed hygiene post incontinent BM.  Pt perseverating on "back" and refusing to sit at EOB to even attempt bathing seated upright.  Pt washed chest and stomach with min cues to initiate.  Pt anxious throughout session with increased respiration rate at 36.  Cues for slowed breathing with pt continuing to breathe quickly.  Pt scooted up in bed with +2 to increase positioning and left upright in bed.  Therapy Documentation Precautions:  Precautions Precautions: Fall Precaution Comments: Peg, Rt shoulder subluxation Restrictions Weight Bearing Restrictions: No General: General OT Amount of Missed Time: 10 Minutes Vital Signs: Therapy Vitals BP: (!) 150/80 mmHg Pain: Pain Assessment Pain Assessment: CPOT Faces Pain Scale: No hurt Critical Care Pain Observation Tool (CPOT) Facial Expression: Relaxed, neutral Body Movements: Absence of movements Muscle Tension: Tense, rigid Compliance with ventilator (intubated pts.): N/A Vocalization (extubated pts.): Sighing, moaning CPOT Total: 2  See Function Navigator for Current Functional Status.   Therapy/Group: Individual Therapy  Simonne Come 06/06/2015, 11:51 AM

## 2015-06-06 NOTE — Progress Notes (Signed)
Late entry. Patient noted with HR 120's this am. Rechecked for 110 HR 0830. Patient did not report SOB or chest pain to RN. Patient participated in therapy today . Patient noted with some labored breathing throughout day but denied distress. HR 122 at 1441. B/P 121/73 Temp. 98.1 RR 24. Rechecked HR at 1456 for 121. O2 sat at 97% RA. Lezlie Lye, PA notified . EKG obtained. Orders received. Continued with plan of care .  Mliss Sax

## 2015-06-06 NOTE — Telephone Encounter (Signed)
Received a call from [facility] stating [name] was hypotensive. Rapid response nurse gave an order for a fluid bolus. Spoke with Monica at 11:10 vitals Temp 97.7 Blood Pressure 98/54. HR 115 and O2 sat's 97%. Cardiology Note Reviewed Reesa Chew PA placed order for CT scan.

## 2015-06-06 NOTE — Progress Notes (Signed)
Patient appears more comfortable now than this am.   Heart rate continues to be tachycardic but no signs of discomfort noted--patient smiling and gave thumbs up. Will await cardiology input.

## 2015-06-06 NOTE — Progress Notes (Signed)
Social Work Patient ID: Kerry [name], female   DOB: 08-23-1955, 59 y.o.   MRN: [id number] Met with pt , husband and son's fiance to discuss equipment needed, hosptial bed, wheelchair, drop-arm bedside commode, 30 transfer board and will wait on tub bench. All but wheelchair to be delivered to home by Friday and wheelchair to come up to the hospital room. Family education went well according to family.

## 2015-06-06 NOTE — Progress Notes (Signed)
Follow up call received per floor RN regarding Pt BP. Current VS  HR 102, bp 79/40, RR 22 Po2 94 on RA . Call placed to Provider on call awaiting call back, lactic acid still not drawn. RN advised to infuse 250 NS bolus and check BP after fluids completed. Suggest Pt may need to transfer to higher level of care if her hypotension does not improve. Will follow.

## 2015-06-06 NOTE — Patient Care Conference (Signed)
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 Vomiting blood   . 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 Hemiparesis, aphasia, and dysphagia as late effect of cerebrovascular accident (CVA) ([place])   . Essential hypertension   . Coronary artery disease involving native coronary artery of native heart with angina pectoris ([place])   . History of breast cancer   . Fibromyalgia   . Chronic pain syndrome   . Generalized anxiety disorder   . Prerenal azotemia   . Pneumonia   . Chronic respiratory failure ([place])   . Respiratory failure ([place])   . Hypokalemia   . Compromised airway   . HLD (hyperlipidemia)   . Type 2 diabetes mellitus with circulatory disorder ([place])   . Sinus bradycardia   . Asystole ([place])   . Acute respiratory failure ([place])   . Cerebral hemorrhage ([place])   . Cytotoxic cerebral edema ([place])   . Sinus pause   . Cerebrovascular accident (CVA) due to occlusion of left middle cerebral artery ([place])   . Cerebrovascular accident (CVA) due to thrombosis of precerebral artery ([place])   . Encounter for central line placement   . Encounter for feeding tube placement   . CVA (cerebral infarction) 04/11/2015  . Stroke (cerebrum) ([place]) 04/11/2015  . Aphasia   . 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  Hypertensive urgency   . Uncontrolled type 2 diabetes mellitus with hyperosmolarity without coma, without long-term current use of insulin ([place])   . Breast cancer ([place]) 11/08/2012    Expected Discharge Date: Expected Discharge Date: 06/09/15  Team Members Present: Physician leading conference: Dr. Alysia Penna Social Worker Present: Ovidio Kin, LCSW Nurse Present: Dorien Chihuahua, RN PT Present: Kem Parkinson, PT OT Present: Simonne Come, OT SLP Present: Windell Moulding, SLP PPS Coordinator present : Daiva Nakayama, RN, CRRN     Current Status/Progress Goal Weekly Team Focus  Medical   Increased nausea likely antibiotic or UTI related, episode of epistaxis  no GI bleed  Adequate oral intake of fluids and calories and pills  Supplement oral intake with bolus feeds, continue H2O flushes   Bowel/Bladder   Incontinent of bowel and bladder. LBM 06/05/15.R Requiring I&O cath q 6-8hrs  Managed bowel and bladder  Tiimed toileting q 2-3 hours   Swallow/Nutrition/ Hydration   upgraded to Dys 2, nectar thick liquids; water protocol; full supervision for use of swallowing precautions    min assist   toleration of recently upgraded diet   ADL's   min assist UB bathing, mod assist LB bathing sit to stand per notes, mod to max assist for transfers, Brunnstrum stage I in the right hand and stage II in the arm.    min assist overall, mod assist LB dressing and toileting  dynamic sitting balance, functional  transfers, neuromuscular re-education, selfcare re-training, pt/family education   Mobility   min/modA bed mobility, modA squat pivot transfers, minA w/c propulsion, maxA gait with rail. Decreased motivation/particiation with recent nausea/vomiting   S bed mobility, transfers/w/c propulsion/ minA, gait modA with therapy  Bed mobility, transfers, sit <>stand for functional tasks, plan for family ed when family attends therapy   Communication   mod assist for receptive language with basic  information and commands, improved response accuracy with use of visual aids, slightly improved naming of familiar objects but not consistent  Mod assist   production of automatic sequences, awareness and correction of perseveration    Safety/Cognition/ Behavioral Observations  continues to require max to total assist to correct perseverative verbal errors, improving right inattention  continue to defer formal cognitive goals due to langauge deficits       Pain   No c/o pain  <3  Assess for nonverbal cues of pain   Skin   R buttock red, blanchable. Barrier cream to area, Peg in RUQ, CDI  No additional skin breakdown  Assist with turn q 2hrs      *See Care Plan and progress notes for long and short-term goals.  Barriers to Discharge: Severe neurologic deficits, severe receptive and expressive aphasia as well as dense hemiplegia    Possible Resolutions to Barriers:  Continue rehabilitation program, extensive family teaching will be needed    Discharge Planning/Teaching Needs:  Family training on-going need to have son and his fiancee here. Pt has missed therapies due to medical issues, question if should extend couple days      Team Discussion:  Family training on-going with husband and son's fiancee-here today. Nausea better. Passed MBS diet Dys 2 with nectar thick liquids. Hopefully po will improve. Treating for UTI. Chest x-ray, KUB and labs clear. Started zofran for nausea. Discharge still 12/17 home  Revisions to Treatment Plan:  DC home Sat   Continued Need for Acute Rehabilitation Level of Care: The patient requires daily medical management by a physician with specialized training in physical medicine and rehabilitation for the following conditions: Daily direction of a multidisciplinary physical rehabilitation program to ensure safe treatment while eliciting the highest outcome that is of practical value to the patient.: Yes Daily medical management of patient stability for  increased activity during participation in an intensive rehabilitation regime.: Yes Daily analysis of laboratory values and/or radiology reports with any subsequent need for medication adjustment of medical intervention for : Neurological problems;Other  Elease Hashimoto 06/06/2015, 12:34 PM

## 2015-06-06 NOTE — Plan of Care (Signed)
Problem: RH KNOWLEDGE DEFICIT Goal: RH STG INCREASE KNOWLEDGE OF DIABETES Pt family will verbalize understanding of the proper diet, meds, and the importance of exercise for DM  Outcome: Progressing Needs continuing education. Goal: RH STG INCREASE KNOWLEDGE OF HYPERTENSION Patient's caregiver able to verbalize understanding of s/s of hypo/hypertension with minimal assistance. Caregiver also able to demonstrate understanding of diet, medications, and treatment prescribed by MD with minimal assistance.  Outcome: Progressing Needs continuing education. Goal: RH STG INCREASE KNOWLEDGE OF DYSPHAGIA/FLUID INTAKE Patient's caregiver able to demonstrate understanding of swallowing precautions, textures, and consistency of foods prescribed by MD with minimal assistance.  Outcome: Progressing Needs continuing education

## 2015-06-06 NOTE — Progress Notes (Addendum)
Call received per floor RN at 2100 regarding Pt with low BP 86/56 HR 115 at 2030. Pt ST on days and Cardiologist saw Pt today. Upon my arrival at 2107 Pt found resting in bed in no apparent distress, awake alert following commands. Lung sound clear, diminished at bases, Patient has a chronic sigh/wheeze with inspiration per floor RN. Rectal temp obtained yielding 100, HR 109, Bp 97/56, RR 23, Po2 96 on RA. Clonidine patch on right arm removed. Lactic Acid lab placed per sepsis protocol. RN advised to monitor Pt closely and update Provider with results and lab results from earlier this evening including D dimer cbc bmet.

## 2015-06-06 NOTE — Progress Notes (Signed)
[facility] PHYSICAL MEDICINE & REHABILITATION     PROGRESS NOTE    Subjective/Complaints: Appreciate gastroenterology consult. No signs of upper GI bleed. Patient remains aphasic. Abdominal and chest x-rays were unremarkable. White count is normal.  Unable to perform ROS due to aphasia  Objective: Vital Signs: Blood pressure 144/89, pulse 108, temperature 98.1 F (36.7 C), temperature source Oral, resp. rate 21, weight 77.5 kg (170 lb 13.7 oz), SpO2 97 %. Dg Chest 2 View  06/05/2015  CLINICAL DATA:  Shortness of breath with exertion, personal history of breast cancer, patient smokes EXAM: CHEST  2 VIEW COMPARISON:  05/14/2015 FINDINGS: Stable mild cardiac enlargement. Vascular pattern normal. No consolidation or effusion. IMPRESSION: No active cardiopulmonary disease. Electronically Signed   By: Skipper Cliche M.D.   On: 06/05/2015 10:39   Dg Abd Portable 1v  06/04/2015  CLINICAL DATA:  Hematemesis. EXAM: PORTABLE ABDOMEN - 1 VIEW COMPARISON:  04/29/2015 FINDINGS: No evidence of bowel obstruction or ileus. Tube overlying the right abdomen appears to represent a gastrostomy tube. The region of the stomach is cut off at the top of the film. IMPRESSION: No evidence of bowel obstruction or ileus. Electronically Signed   By: Aletta Edouard M.D.   On: 06/04/2015 17:22    Recent Labs  06/04/15 2139 06/05/15 0335  WBC 8.9 7.6  HGB 11.5* 11.7*  HCT 36.0 37.0  PLT 238 251    Recent Labs  06/04/15 1630 06/05/15 0335  NA 139 138  K 3.3* 3.5  CL 100* 99*  GLUCOSE 111* 88  BUN 12 8  CREATININE 0.54 0.46  CALCIUM 9.6 9.3   CBG (last 3)  No results for input(s): GLUCAP in the last 72 hours.  Wt Readings from Last 3 Encounters:  06/06/15 77.5 kg (170 lb 13.7 oz)  05/16/15 86.183 kg (190 lb)  04/27/15 92.5 kg (203 lb 14.8 oz)    Physical Exam:  Constitutional: She appears well-developed and well-nourished. No distress.  Laying in bed and comfortable  HENT: Lurline Idol site is  healed Head: Normocephalic and atraumatic.  Mouth/Throat: Oropharynx is   Moist still with white film on tongue   Missing upper teeth and multiple lower teeth.  Eyes: Conjunctivae and EOM are normal. Pupils are equal, round, and reactive to light.  Neck: Normal range of motion. Neck supple.  Cardiovascular: Normal rate and regular rhythm. Exam reveals no friction rub.  Respiratory: Effort normal. No respiratory distress. She makes stridorous sounds when breathing but this is volitional GI: Soft. Bowel sounds are normal. She exhibits no distension. There is no tenderness.  PEG site clean and dry without drainage.  Musculoskeletal: She exhibits no edema or tenderness.  Neurological: She is alert.    Apraxia noted. occasionally with an appropriate y/n nod. Poor oral-motor control Inconsistently follows commands    Unable to perform motor and sensory testing due to severe apraxia , Grossly 4/5 in LUE and LLE.No active movement right upper or right lower extremity  DTRs 3+ on right side    Skin: Skin is warm and dry. No rash noted. She is not diaphoretic. No erythema.  Psychiatric: She is not slowed and not withdrawn. Cognition and memory are impaired. She is noncommunicative. She is inattentive to right.     Assessment/Plan: 1. 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 Physiatrist is providing close team supervision and 24 hour management of active medical problems listed below.  Physiatrist and rehab team continue to assess barriers to discharge/monitor patient progress toward functional and medical goals. Patient has had a large left MCA infarct with poor prognosis for recovery of function.   Function:  Bathing Bathing position   Position: Sitting EOB  Bathing parts Body parts bathed by patient: Chest, Abdomen, Right upper leg, Left upper leg, Front  perineal area, Right arm Body parts bathed by helper: Left arm, Buttocks, Right lower leg, Left lower leg, Back  Bathing assist Assist Level: Touching or steadying assistance(Pt > 75%)      Upper Body Dressing/Undressing Upper body dressing   What is the patient wearing?: Hospital gown     Pull over shirt/dress - Perfomed by patient: Thread/unthread left sleeve Pull over shirt/dress - Perfomed by helper: Thread/unthread right sleeve        Upper body assist Assist Level: Touching or steadying assistance(Pt > 75%)      Lower Body Dressing/Undressing Lower body dressing Lower body dressing/undressing activity did not occur: N/A What is the patient wearing?: Pants, Non-skid slipper socks     Pants- Performed by patient: Thread/unthread left pants leg Pants- Performed by helper: Pull pants up/down, Thread/unthread right pants leg Non-skid slipper socks- Performed by patient: Don/doff right sock Non-skid slipper socks- Performed by helper: Don/doff right sock, Don/doff left sock Socks - Performed by patient: Don/doff right sock Socks - Performed by helper: Don/doff left sock Shoes - Performed by patient: Don/doff left shoe Shoes - Performed by helper: Don/doff right shoe, Don/doff left shoe          Lower body assist Assist for lower body dressing: 2 Helpers      Naval architect activity did not occur: No continent bowel/bladder event   Toileting steps completed by helper: Adjust clothing prior to toileting, Performs perineal hygiene, Adjust clothing after toileting    Toileting assist Assist level: Touching or steadying assistance (Pt.75%)   Transfers Chair/bed transfer   Chair/bed transfer method: Squat pivot Chair/bed transfer assist level: Moderate assist (Pt 50 - 74%/lift or lower) Chair/bed transfer assistive device: Armrests     Locomotion Ambulation Ambulation activity did not occur: Safety/medical concerns   Max distance: 20 Assist level: 2  helpers   Oceanographer activity did not occur: Safety/medical concerns (pt in tilt-in-space w/c, unable to propel) Type: Manual Max wheelchair distance: 50 Assist Level: Moderate assistance (Pt 50 - 74%)  Cognition Comprehension Comprehension assist level: Understands basic 50 - 74% of the time/ requires cueing 25 - 49% of the time  Expression Expression assist level: Expresses basic 25 - 49% of the time/requires cueing 50 - 75% of the time. Uses single words/gestures.  Social Interaction Social Interaction assist level: Interacts appropriately 50 - 74% of the time - May be physically or verbally inappropriate.  Problem Solving Problem solving assist level: Solves basic 25 - 49% of the time - needs direction more than half the time to initiate, plan or complete simple activities  Memory Memory assist level: Recognizes or recalls 25 - 49% of the time/requires cueing 50 - 75% of the time   Medical Problem List and Plan: 1. Functional deficits secondary to Left MCA infarct, Right hemiplegia, apraxia, aphasia,Team conference today please see physician documentation under team conference tab, met with team face-to-face to discuss problems,progress, and goals. Formulized individual treatment plan based on medical history, underlying problem and comorbidities.  -continue CIR therapies 2. DVT Prophylaxis/Anticoagulation: Pharmaceutical: Lovenox- held for epistaxis and coffee grounds aspirated from PEG flush 3. Chronic DDD/Fibromyalgia/Pain  Management: Used oxycodone prn at home per family. Continue tylenol prn for now  -appears comfortable at present 4. Mood: LCSW to follow for evaluation and support as mentation improves.  5. Neuropsych: This patient is not capable of making decisions on her own behalf. 6. Skin/Wound Care: Routine pressure relief measures  -ocean nasal spray for dry nasal mucosa 7. Fluids/Electrolytes/Nutrition:eating quite well with supervision, By mouth intake remains  variable,2 feeding bolus one can after meals if less than 50% intake. Also one can at bedtime  -continue H20 flushes through PEG.Will increase volume, monitor for fluid overload 8. HTN: Monitor BP bid. On Lisinopril, hydralazine resumed will change catapres to trans dermal 9. DM type 2: Family reports non-compliance with insulin at home. Will monitor BS ac/hs. Continue lantus bid with glipizide BID. Use SSI for elevated BS  -good control at present.cbg's reviewed CBG, well controlled at present,98 this am, monitor hypoglycemia 10. Anxiety disorder: Used Paxil and ativan at home. Monitor as awareness improves.   -resumed low dose paxil--follow up with family about home dose 11. Acute pre-renal azotemia:resolved increased water flushes to qid. BUN Normal at 8   14. Constipation resolved, daily cont BMs 15. Urinary retention which is intermittent. Patient will need to try voiding on a toilet with assistance, Escherichia coli UTI, currently NPO, also question if nausea related to Amoxil 16.  severe dysphagia status post stroke, Continue honey thick liquids, monitor for aspiration, Supplemental water boluses,Recheck MBS this week to see if fluids can be upgraded,should be able to do that today LOS (Days) 21 A FACE TO FACE EVALUATION WAS PERFORMED  Alysia Penna E 06/06/2015 8:59 AM

## 2015-06-06 NOTE — Progress Notes (Signed)
Social Work Patient ID: Kerry [name], female   DOB: 01-Sep-1955, 59 y.o.   MRN: [id number] Met with pt and husband to discuss team conference and the need for he and son's finance to learn her care prior to Sat discharge. He and she are here today, to attend therapies. All still plan to take pt home even though she is a lot of care. Husband states: " We will do it and figure it out."  Discussed equipment and follow up therapies, PT to show them how to bump up wheelchair Up steps. Work toward Sat discharge. All pleased with diet increase and feel pt will eat better.

## 2015-06-06 NOTE — Progress Notes (Signed)
Physical Therapy Session Note  Patient Details  Name: Kerry [name] MRN: [id number] Date of Birth: 10-14-1955  Today's Date: 06/06/2015 PT Individual Time: 1130-1200 PT Individual Time Calculation (min): 30 min   Short Term Goals: Week 3:  PT Short Term Goal 1 (Week 3): =LTG due to estimated length of stay  Skilled Therapeutic Interventions/Progress Updates:    Pt received in bed with husband Gershon Mussel and daughter in law Amy present for family training. Therapeutic Activity - Husband demonstrates using bed controls to raise HOB and flatten FOB, then assist pt modified supine to EOB transfer. Once EOB, husband stand-pivots pt bed to w/c to R - pt's R leg begins to push w/c away from bed as pt turns and PT assists in w/c placement while husband helps pt sit in w/c. Pt requires scooting laterally in w/c for proper positioning and husband attempts to scoot pt while her feet were in windswept position and PT interrupts and demonstrates to husband proper foot positioning (under hips), blocking pt's R knee, and using head-hips to help pt get into squat position and scoot to center of w/c. PT then demonstrates and verbally guides DIL Amy in scooting pt forward in w/c, correct w/c set-up (w/c positioned close to bed, armrest swung back/out of way, brakes locked), and in squat-pivot transfer assisting pt under armpits and blocking R knee. Amy demonstrates safe squat-pivot transfer w/c to/from bed and helping pt sit to supine transfer (mod A for B LEs). PT educates both family members how to use a draw sheet to scoot pt up in bed (as well as bed controls if home hospital bed has [place] function) and to position pillow under R arm for support. PT also shows family to raise FOB prior to raising [facility] in order to reduce pt sliding towards FOB. Bed alarm on and pt left with all needs in reach and family present. Continue per PT POC.   Therapy Documentation Precautions:  Precautions Precautions:  Fall Precaution Comments: Peg, Rt shoulder subluxation Restrictions Weight Bearing Restrictions: No Pain: Pain Assessment Pain Assessment: CPOT Faces Pain Scale: No hurt Critical Care Pain Observation Tool (CPOT) Facial Expression: Relaxed, neutral Body Movements: Absence of movements Muscle Tension: Tense, rigid Compliance with ventilator (intubated pts.): N/A Vocalization (extubated pts.): Sighing, moaning CPOT Total: 2   See Function Navigator for Current Functional Status.   Therapy/Group: Individual Therapy  Aldan Camey M 06/06/2015, 11:46 AM

## 2015-06-06 NOTE — Consult Note (Signed)
Consulting cardiologist: Dr Carlyle Dolly MD  Clinical Summary Kerry [name] is a 59 y.o.female history of recent CVA currently in rehab, cardiology had followed previously due to severe bradycardia post her CVA requiring a temporary pacer in late October for a period of time, the bradycardia was thought related to her CVA. From her CVA she has resulting right hemiparesis, aphasia, and dysphagia. Cardiology is consulted today for tachycardia. From recent notes she had some coffee grounds fluid aspirated from PEG tube recently as well as epistaxis, her DVT prophylasix was held for 2 days.. She has had some abdominal pain over the last few days. Also reported history of chronic pain and anxiety for which she is on opiates and benzos for.   From chart review tachycardia appeared to start yesterday mid afternoon to 100s-140s. History is limited as she is aphasic, history gathered using a yes/no board. She indicates she did have some chest pain this AM and afternoon along with some SOB that has primarily resolved at this time.    Vitals p 121 bp 121/73 97% RA 03/2015 echo LVEF 55-60%, no WMAs K 3.5, Cr 0.46, Hgb 11.7, Plt 251,  CXR no acute process EKG Sinus tach 120, Qwaves inferior and anterior, nonspecific ST/T changes  Allergies  Allergen Reactions  . Codeine     Nausea?-- "made her sick"  . Oxycodone Nausea Only    Sick   . Oxycontin [Oxycodone Hcl] Nausea Only    Sick     Medications Scheduled Medications: . amLODipine  5 mg Oral Daily  . antiseptic oral rinse  7 mL Mouth Rinse BID  . atorvastatin  20 mg Oral q1800  .  ceFAZolin (ANCEF) IV  1 g Intravenous 3 times per day  . cloNIDine  0.1 mg Transdermal Weekly  . clopidogrel  75 mg Oral Daily  . enoxaparin (LOVENOX) injection  40 mg Subcutaneous Q24H  . feeding supplement (PRO-STAT SUGAR FREE 64)  30 mL Oral TID BM  . free water  200 mL Per Tube TID WC & HS  . glipiZIDE  5 mg Oral QAC breakfast  . hydrALAZINE  20 mg Oral  3 times per day  . hydrocerin   Topical BID  . insulin aspart  0-5 Units Subcutaneous QHS  . insulin aspart  0-9 Units Subcutaneous TID WC  . lisinopril  10 mg Oral BID  . multivitamin with minerals  1 tablet Oral Daily  . ondansetron  4 mg Oral Q4H while awake  . pantoprazole sodium  20 mg Per Tube BID  . PARoxetine  10 mg Per Tube Daily  . sodium chloride  1 spray Each Nare TID  . thiamine  100 mg Oral Daily  . traMADol  25 mg Oral 4 times per day  . zinc sulfate  220 mg Oral BID     Infusions:     PRN Medications:  acetaminophen (TYLENOL) oral liquid 160 mg/5 mL, acetaminophen, alum & mag hydroxide-simeth, bisacodyl, diphenhydrAMINE, feeding supplement (JEVITY 1.2 CAL), guaiFENesin-dextromethorphan, LORazepam, prochlorperazine **OR** prochlorperazine **OR** prochlorperazine, RESOURCE THICKENUP CLEAR, silver nitrate applicators, sodium phosphate, traZODone   Past Medical History  Diagnosis Date  . Hypertension   . Coronary artery disease   . Diabetes mellitus   . Breast cancer ([place]) 03/10/2007    Left breat  . Degenerative disc disease, cervical   . Degenerative disc disease, lumbar   . Fibromyalgia   . Hyperlipidemia   . Carpal tunnel syndrome on right     Past  Surgical History  Procedure Laterality Date  . Cardiac surgery    . Appendectomy    . Breast surgery    . Mastectomy Left 03/2007  . Cardiac catheterization  2005  . Vein surgery Right 2005  . Tubal ligation Right   . Radiology with anesthesia N/A 04/11/2015    Procedure: RADIOLOGY WITH ANESTHESIA;  Surgeon: Luanne Bras, MD;  Location: [place] [facility];  Service: Radiology;  Laterality: N/A;  . Cardiac catheterization N/A 04/13/2015    Procedure: Temporary Pacemaker;  Surgeon: Peter M Martinique, MD;  Location: [place] [facility];  Service: Cardiovascular;  Laterality: N/A;  . Peg placement N/A 04/18/2015    Procedure: PERCUTANEOUS ENDOSCOPIC GASTROSTOMY (PEG) PLACEMENT;  Surgeon: Judeth Horn, MD;  Location: [facility];  Service: General;  Laterality: N/A;  bedside  . Esophagogastroduodenoscopy N/A 04/18/2015    Procedure: ESOPHAGOGASTRODUODENOSCOPY (EGD);  Surgeon: Judeth Horn, MD;  Location: [facility];  Service: General;  Laterality: N/A;    Family History  Problem Relation Age of Onset  . Cancer Mother     Breast cancer (right)  . Diabetes Sister   . Cancer Maternal Aunt     Breast cancer  . Cancer Maternal Aunt     Breast cancer  . Cancer Cousin     Breast Cancer    Social History Ms. Mccorkle reports that she has been smoking.  She has never used smokeless tobacco. Ms. Seeger reports that she drinks alcohol.  Review of Systems Unable to gather, patient is aphasic.      Physical Examination Blood pressure 130/70, pulse 121, temperature 98.1 F (36.7 C), temperature source Oral, resp. rate 24, weight 170 lb 13.7 oz (77.5 kg), SpO2 97 %.  Intake/Output Summary (Last 24 hours) at 06/06/15 1841 Last data filed at 06/06/15 1800  Gross per 24 hour  Intake    270 ml  Output    500 ml  Net   -230 ml    HEENT: sclera clear  Cardiovascular: RRR, no m/r/g, no jvd  Respiratory: CTAB  GI: abdomen soft, NT, ND  MSK: no LE edema  Neuro: right sided weakness  Psych: appropriate affect   Lab Results  Basic Metabolic Panel:  Recent Labs Lab 06/04/15 1630 06/05/15 0335  NA 139 138  K 3.3* 3.5  CL 100* 99*  CO2 29 29  GLUCOSE 111* 88  BUN 12 8  CREATININE 0.54 0.46  CALCIUM 9.6 9.3    Liver Function Tests: No results for input(s): AST, ALT, ALKPHOS, BILITOT, PROT, ALBUMIN in the last 168 hours.  CBC:  Recent Labs Lab 06/04/15 1554 06/04/15 2139 06/05/15 0335  WBC 8.7 8.9 7.6  HGB 11.7* 11.5* 11.7*  HCT 37.1 36.0 37.0  MCV 87.3 87.4 87.9  PLT 242 238 251    Cardiac Enzymes: No results for input(s): CKTOTAL, CKMB, CKMBINDEX, TROPONINI in the last 168 hours.  BNP: Invalid input(s): POCBNP     Impression/Recommendations 1. Sinus  tachycardia - highest on differential is possible PE given sudden onset sinus tachycardia along with reported chest pain and SOB this AM. She is high risk given her long hospital admission and right sided hemiparesis. Her DVT prophylaxis had to be held 2 days due to epistaxis and coffee ground in her peg tube - D-dimer is pending, this is very likely to be elevated simply due to her recent CVA and being hospitalized. If elevated would obtain a CT PE. If she has a PE would recommend IV heparin due to its shorter  half life is she has troubles with bleeding again -cardiac ischemia is less likely, can f/u troponins. Her EKG shows sinus tach with nonspecific ST/T changes. - consider transfer to tele bed for closer monitoring.   Carlyle Dolly, M.D.

## 2015-06-06 NOTE — Progress Notes (Addendum)
Patient reported to be tachycardic.  No signs of abdominal pain but as also has bouts of abdominal breathing/tacpnea.  EKG with sinus tach with T wave abnormality. Cardiac enzymes ordered and cardiology paged for input. Will recheck labs today.

## 2015-06-07 ENCOUNTER — Inpatient Hospital Stay (HOSPITAL_COMMUNITY): Payer: 59

## 2015-06-07 ENCOUNTER — Observation Stay (HOSPITAL_COMMUNITY): Payer: 59

## 2015-06-07 ENCOUNTER — Inpatient Hospital Stay (HOSPITAL_COMMUNITY): Payer: 59 | Admitting: Speech Pathology

## 2015-06-07 ENCOUNTER — Telehealth: Payer: Self-pay | Admitting: Registered Nurse

## 2015-06-07 ENCOUNTER — Encounter (HOSPITAL_COMMUNITY): Payer: 59

## 2015-06-07 ENCOUNTER — Inpatient Hospital Stay (HOSPITAL_COMMUNITY)
Admission: RE | Admit: 2015-06-07 | Discharge: 2015-06-13 | DRG: 871 | Disposition: A | Discharge status: Rehabilitation Facility | Payer: 59 | Source: Ambulatory Visit | Attending: Internal Medicine | Admitting: Internal Medicine

## 2015-06-07 ENCOUNTER — Inpatient Hospital Stay (HOSPITAL_COMMUNITY): Payer: 59 | Admitting: Occupational Therapy

## 2015-06-07 ENCOUNTER — Encounter (HOSPITAL_COMMUNITY): Payer: Self-pay | Admitting: Radiology

## 2015-06-07 DIAGNOSIS — E875 Hyperkalemia: Secondary | ICD-10-CM | POA: Diagnosis not present

## 2015-06-07 DIAGNOSIS — R4182 Altered mental status, unspecified: Secondary | ICD-10-CM | POA: Insufficient documentation

## 2015-06-07 DIAGNOSIS — K219 Gastro-esophageal reflux disease without esophagitis: Secondary | ICD-10-CM | POA: Diagnosis present

## 2015-06-07 DIAGNOSIS — I2699 Other pulmonary embolism without acute cor pulmonale: Secondary | ICD-10-CM | POA: Diagnosis present

## 2015-06-07 DIAGNOSIS — E785 Hyperlipidemia, unspecified: Secondary | ICD-10-CM | POA: Diagnosis present

## 2015-06-07 DIAGNOSIS — R130 Aphagia: Secondary | ICD-10-CM | POA: Diagnosis present

## 2015-06-07 DIAGNOSIS — R0902 Hypoxemia: Secondary | ICD-10-CM | POA: Diagnosis not present

## 2015-06-07 DIAGNOSIS — Z789 Other specified health status: Secondary | ICD-10-CM | POA: Diagnosis not present

## 2015-06-07 DIAGNOSIS — I10 Essential (primary) hypertension: Secondary | ICD-10-CM | POA: Diagnosis present

## 2015-06-07 DIAGNOSIS — I82402 Acute embolism and thrombosis of unspecified deep veins of left lower extremity: Secondary | ICD-10-CM | POA: Diagnosis not present

## 2015-06-07 DIAGNOSIS — I69391 Dysphagia following cerebral infarction: Secondary | ICD-10-CM | POA: Diagnosis not present

## 2015-06-07 DIAGNOSIS — J3802 Paralysis of vocal cords and larynx, bilateral: Secondary | ICD-10-CM | POA: Diagnosis present

## 2015-06-07 DIAGNOSIS — R400 Somnolence: Secondary | ICD-10-CM | POA: Diagnosis not present

## 2015-06-07 DIAGNOSIS — J3800 Paralysis of vocal cords and larynx, unspecified: Secondary | ICD-10-CM | POA: Diagnosis not present

## 2015-06-07 DIAGNOSIS — Z931 Gastrostomy status: Secondary | ICD-10-CM | POA: Diagnosis not present

## 2015-06-07 DIAGNOSIS — G9340 Encephalopathy, unspecified: Secondary | ICD-10-CM | POA: Diagnosis not present

## 2015-06-07 DIAGNOSIS — A419 Sepsis, unspecified organism: Principal | ICD-10-CM | POA: Diagnosis present

## 2015-06-07 DIAGNOSIS — J9602 Acute respiratory failure with hypercapnia: Secondary | ICD-10-CM | POA: Diagnosis not present

## 2015-06-07 DIAGNOSIS — N390 Urinary tract infection, site not specified: Secondary | ICD-10-CM | POA: Diagnosis present

## 2015-06-07 DIAGNOSIS — Z853 Personal history of malignant neoplasm of breast: Secondary | ICD-10-CM

## 2015-06-07 DIAGNOSIS — G8191 Hemiplegia, unspecified affecting right dominant side: Secondary | ICD-10-CM | POA: Diagnosis present

## 2015-06-07 DIAGNOSIS — Z86711 Personal history of pulmonary embolism: Secondary | ICD-10-CM

## 2015-06-07 DIAGNOSIS — I25119 Atherosclerotic heart disease of native coronary artery with unspecified angina pectoris: Secondary | ICD-10-CM | POA: Diagnosis present

## 2015-06-07 DIAGNOSIS — I248 Other forms of acute ischemic heart disease: Secondary | ICD-10-CM | POA: Diagnosis present

## 2015-06-07 DIAGNOSIS — R7989 Other specified abnormal findings of blood chemistry: Secondary | ICD-10-CM | POA: Diagnosis present

## 2015-06-07 DIAGNOSIS — M797 Fibromyalgia: Secondary | ICD-10-CM | POA: Diagnosis present

## 2015-06-07 DIAGNOSIS — G8100 Flaccid hemiplegia affecting unspecified side: Secondary | ICD-10-CM | POA: Diagnosis present

## 2015-06-07 DIAGNOSIS — R040 Epistaxis: Secondary | ICD-10-CM | POA: Diagnosis present

## 2015-06-07 DIAGNOSIS — R4701 Aphasia: Secondary | ICD-10-CM | POA: Diagnosis present

## 2015-06-07 DIAGNOSIS — R061 Stridor: Secondary | ICD-10-CM | POA: Insufficient documentation

## 2015-06-07 DIAGNOSIS — Z794 Long term (current) use of insulin: Secondary | ICD-10-CM | POA: Diagnosis not present

## 2015-06-07 DIAGNOSIS — J9620 Acute and chronic respiratory failure, unspecified whether with hypoxia or hypercapnia: Secondary | ICD-10-CM | POA: Diagnosis not present

## 2015-06-07 DIAGNOSIS — F17200 Nicotine dependence, unspecified, uncomplicated: Secondary | ICD-10-CM | POA: Diagnosis present

## 2015-06-07 DIAGNOSIS — I63312 Cerebral infarction due to thrombosis of left middle cerebral artery: Secondary | ICD-10-CM | POA: Diagnosis not present

## 2015-06-07 DIAGNOSIS — Z803 Family history of malignant neoplasm of breast: Secondary | ICD-10-CM | POA: Diagnosis not present

## 2015-06-07 DIAGNOSIS — I959 Hypotension, unspecified: Secondary | ICD-10-CM | POA: Insufficient documentation

## 2015-06-07 DIAGNOSIS — Z9012 Acquired absence of left breast and nipple: Secondary | ICD-10-CM

## 2015-06-07 DIAGNOSIS — R778 Other specified abnormalities of plasma proteins: Secondary | ICD-10-CM | POA: Diagnosis present

## 2015-06-07 DIAGNOSIS — R0689 Other abnormalities of breathing: Secondary | ICD-10-CM | POA: Diagnosis not present

## 2015-06-07 DIAGNOSIS — I639 Cerebral infarction, unspecified: Secondary | ICD-10-CM | POA: Diagnosis present

## 2015-06-07 DIAGNOSIS — J189 Pneumonia, unspecified organism: Secondary | ICD-10-CM | POA: Diagnosis not present

## 2015-06-07 DIAGNOSIS — R401 Stupor: Secondary | ICD-10-CM | POA: Diagnosis not present

## 2015-06-07 DIAGNOSIS — J15211 Pneumonia due to Methicillin susceptible Staphylococcus aureus: Secondary | ICD-10-CM | POA: Diagnosis not present

## 2015-06-07 DIAGNOSIS — E1151 Type 2 diabetes mellitus with diabetic peripheral angiopathy without gangrene: Secondary | ICD-10-CM | POA: Diagnosis not present

## 2015-06-07 DIAGNOSIS — K920 Hematemesis: Secondary | ICD-10-CM | POA: Diagnosis present

## 2015-06-07 DIAGNOSIS — E1159 Type 2 diabetes mellitus with other circulatory complications: Secondary | ICD-10-CM | POA: Diagnosis present

## 2015-06-07 DIAGNOSIS — J9601 Acute respiratory failure with hypoxia: Secondary | ICD-10-CM | POA: Diagnosis not present

## 2015-06-07 DIAGNOSIS — Z79899 Other long term (current) drug therapy: Secondary | ICD-10-CM | POA: Diagnosis not present

## 2015-06-07 DIAGNOSIS — I619 Nontraumatic intracerebral hemorrhage, unspecified: Secondary | ICD-10-CM | POA: Diagnosis not present

## 2015-06-07 DIAGNOSIS — E876 Hypokalemia: Secondary | ICD-10-CM | POA: Diagnosis present

## 2015-06-07 DIAGNOSIS — I63012 Cerebral infarction due to thrombosis of left vertebral artery: Secondary | ICD-10-CM | POA: Diagnosis not present

## 2015-06-07 DIAGNOSIS — F411 Generalized anxiety disorder: Secondary | ICD-10-CM | POA: Diagnosis not present

## 2015-06-07 DIAGNOSIS — I82411 Acute embolism and thrombosis of right femoral vein: Secondary | ICD-10-CM | POA: Diagnosis present

## 2015-06-07 DIAGNOSIS — Z4659 Encounter for fitting and adjustment of other gastrointestinal appliance and device: Secondary | ICD-10-CM | POA: Diagnosis not present

## 2015-06-07 DIAGNOSIS — G894 Chronic pain syndrome: Secondary | ICD-10-CM | POA: Diagnosis not present

## 2015-06-07 DIAGNOSIS — I6300 Cerebral infarction due to thrombosis of unspecified precerebral artery: Secondary | ICD-10-CM | POA: Diagnosis not present

## 2015-06-07 DIAGNOSIS — Z452 Encounter for adjustment and management of vascular access device: Secondary | ICD-10-CM

## 2015-06-07 LAB — COMPREHENSIVE METABOLIC PANEL
ALBUMIN: 2.8 g/dL — AB (ref 3.5–5.0)
ALK PHOS: 62 U/L (ref 38–126)
ALT: 21 U/L (ref 14–54)
AST: 19 U/L (ref 15–41)
Anion gap: 8 (ref 5–15)
BILIRUBIN TOTAL: 0.3 mg/dL (ref 0.3–1.2)
BUN: 18 mg/dL (ref 6–20)
CALCIUM: 8.6 mg/dL — AB (ref 8.9–10.3)
CO2: 27 mmol/L (ref 22–32)
CREATININE: 0.48 mg/dL (ref 0.44–1.00)
Chloride: 105 mmol/L (ref 101–111)
GFR calc Af Amer: >60 mL/min (ref >60)
GFR calc non Af Amer: >60 mL/min (ref >60)
GLUCOSE: 129 mg/dL — AB (ref 65–99)
Potassium: 2.8 mmol/L — ABNORMAL LOW (ref 3.5–5.1)
Sodium: 140 mmol/L (ref 135–145)
TOTAL PROTEIN: 5.8 g/dL — AB (ref 6.5–8.1)

## 2015-06-07 LAB — GLUCOSE, CAPILLARY
GLUCOSE-CAPILLARY: 128 mg/dL — AB (ref 65–99)
GLUCOSE-CAPILLARY: 139 mg/dL — AB (ref 65–99)
GLUCOSE-CAPILLARY: 188 mg/dL — AB (ref 65–99)
Glucose-Capillary: 113 mg/dL — ABNORMAL HIGH (ref 65–99)

## 2015-06-07 LAB — CBC WITH DIFFERENTIAL/PLATELET
Basophils Absolute: 0 10*3/uL (ref 0.0–0.1)
Basophils Relative: 0 %
EOS PCT: 1 %
Eosinophils Absolute: 0.1 10*3/uL (ref 0.0–0.7)
HEMATOCRIT: 37.4 % (ref 36.0–46.0)
Hemoglobin: 11.6 g/dL — ABNORMAL LOW (ref 12.0–15.0)
LYMPHS ABS: 3.3 10*3/uL (ref 0.7–4.0)
LYMPHS PCT: 33 %
MCH: 27.7 pg (ref 26.0–34.0)
MCHC: 31 g/dL (ref 30.0–36.0)
MCV: 89.3 fL (ref 78.0–100.0)
MONO ABS: 0.7 10*3/uL (ref 0.1–1.0)
Monocytes Relative: 7 %
Neutro Abs: 5.8 10*3/uL (ref 1.7–7.7)
Neutrophils Relative %: 59 %
PLATELETS: 250 10*3/uL (ref 150–400)
RBC: 4.19 MIL/uL (ref 3.87–5.11)
RDW: 14.9 % (ref 11.5–15.5)
WBC: 10 10*3/uL (ref 4.0–10.5)

## 2015-06-07 LAB — CBC
HCT: 37 % (ref 36.0–46.0)
HCT: 36 % (ref 36.0–46.0)
HEMATOCRIT: 37.5 % (ref 36.0–46.0)
HEMOGLOBIN: 11.6 g/dL — AB (ref 12.0–15.0)
Hemoglobin: 11.9 g/dL — ABNORMAL LOW (ref 12.0–15.0)
Hemoglobin: 11.7 g/dL — ABNORMAL LOW (ref 12.0–15.0)
MCH: 28.5 pg (ref 26.0–34.0)
MCH: 27.4 pg (ref 26.0–34.0)
MCH: 28.5 pg (ref 26.0–34.0)
MCHC: 32.2 g/dL (ref 30.0–36.0)
MCHC: 30.9 g/dL (ref 30.0–36.0)
MCHC: 32.5 g/dL (ref 30.0–36.0)
MCV: 88.5 fL (ref 78.0–100.0)
MCV: 88.4 fL (ref 78.0–100.0)
MCV: 87.6 fL (ref 78.0–100.0)
PLATELETS: 228 10*3/uL (ref 150–400)
Platelets: 237 10*3/uL (ref 150–400)
Platelets: 221 10*3/uL (ref 150–400)
RBC: 4.18 MIL/uL (ref 3.87–5.11)
RBC: 4.24 MIL/uL (ref 3.87–5.11)
RBC: 4.11 MIL/uL (ref 3.87–5.11)
RDW: 14.8 % (ref 11.5–15.5)
RDW: 14.6 % (ref 11.5–15.5)
RDW: 14.5 % (ref 11.5–15.5)
WBC: 9.5 10*3/uL (ref 4.0–10.5)
WBC: 7.4 10*3/uL (ref 4.0–10.5)
WBC: 9.3 10*3/uL (ref 4.0–10.5)

## 2015-06-07 LAB — TYPE AND SCREEN
ABO/RH(D): A POS
ANTIBODY SCREEN: NEGATIVE

## 2015-06-07 LAB — ABO/RH: ABO/RH(D): A POS

## 2015-06-07 LAB — PROTIME-INR
INR: 1.32 (ref 0.00–1.49)
Prothrombin Time: 16.5 seconds — ABNORMAL HIGH (ref 11.6–15.2)

## 2015-06-07 LAB — APTT: aPTT: 77 seconds — ABNORMAL HIGH (ref 24–37)

## 2015-06-07 LAB — HEPARIN LEVEL (UNFRACTIONATED): HEPARIN UNFRACTIONATED: 0.71 [IU]/mL — AB (ref 0.30–0.70)

## 2015-06-07 LAB — BLOOD GAS, ARTERIAL
Acid-Base Excess: 2.3 mmol/L — ABNORMAL HIGH (ref 0.0–2.0)
Bicarbonate: 26.8 mEq/L — ABNORMAL HIGH (ref 20.0–24.0)
DRAWN BY: 313941
O2 CONTENT: 2 L/min
O2 SAT: 98.9 %
PO2 ART: 130 mmHg — AB (ref 80.0–100.0)
Patient temperature: 98.6
TCO2: 28.2 mmol/L (ref 0–100)
pCO2 arterial: 44.9 mmHg (ref 35.0–45.0)
pH, Arterial: 7.393 (ref 7.350–7.450)

## 2015-06-07 LAB — LACTIC ACID, PLASMA
Lactic Acid, Venous: 0.8 mmol/L (ref 0.5–2.0)
Lactic Acid, Venous: 1.1 mmol/L (ref 0.5–2.0)

## 2015-06-07 LAB — TROPONIN I
TROPONIN I: 0.19 ng/mL — AB (ref <0.031)
Troponin I: 0.22 ng/mL — ABNORMAL HIGH
Troponin I: 0.18 ng/mL — ABNORMAL HIGH (ref <0.031)

## 2015-06-07 LAB — PROCALCITONIN
PROCALCITONIN: 0.13 ng/mL
PROCALCITONIN: 0.11 ng/mL

## 2015-06-07 MED ORDER — HEPARIN (PORCINE) IN NACL 100-0.45 UNIT/ML-% IJ SOLN: 1250.0000 [IU]/h | INTRAMUSCULAR | Status: DC

## 2015-06-07 MED ORDER — SENNOSIDES-DOCUSATE SODIUM 8.6-50 MG PO TABS: 1.0000 | ORAL_TABLET | Freq: Two times a day (BID) | ORAL | Status: DC | PRN

## 2015-06-07 MED ORDER — SENNOSIDES 8.8 MG/5ML PO SYRP: 5.0000 mL | ORAL_SOLUTION | Freq: Two times a day (BID) | ORAL | Status: DC | PRN

## 2015-06-07 MED ORDER — THIAMINE HCL 100 MG PO TABS: 100.0000 mg | ORAL_TABLET | Freq: Every day | ORAL | Status: DC

## 2015-06-07 MED ORDER — PRO-STAT SUGAR FREE PO LIQD
30.0000 mL | Freq: Every day | ORAL | Status: DC
  Administered 2015-06-08 – 2015-06-13 (×6): 30 mL via ORAL
  Filled 2015-06-07 (×7): qty 30

## 2015-06-07 MED ORDER — CHLORHEXIDINE GLUCONATE 0.12 % MT SOLN
15.0000 mL | Freq: Two times a day (BID) | OROMUCOSAL | Status: DC
  Administered 2015-06-07 – 2015-06-13 (×12): 15 mL via OROMUCOSAL
  Filled 2015-06-07 (×9): qty 15

## 2015-06-07 MED ORDER — FISH OIL OIL: 160.0000 mg | TOPICAL_OIL | Freq: Every day | Status: DC

## 2015-06-07 MED ORDER — POTASSIUM CHLORIDE 20 MEQ/15ML (10%) PO SOLN
40.0000 meq | Freq: Two times a day (BID) | ORAL | Status: AC
  Administered 2015-06-07 (×2): 40 meq
  Filled 2015-06-07 (×2): qty 30

## 2015-06-07 MED ORDER — SODIUM CHLORIDE 0.9 % IV BOLUS (SEPSIS)
1000.0000 mL | Freq: Once | INTRAVENOUS | Status: AC
  Administered 2015-06-07: 1000 mL via INTRAVENOUS

## 2015-06-07 MED ORDER — MORPHINE SULFATE (PF) 2 MG/ML IV SOLN: 2.0000 mg | INTRAVENOUS | Status: DC | PRN

## 2015-06-07 MED ORDER — SODIUM CHLORIDE 0.9 % IV SOLN
INTRAVENOUS | Status: DC
  Administered 2015-06-07: 10:00:00 via INTRAVENOUS

## 2015-06-07 MED ORDER — MORPHINE SULFATE (PF) 2 MG/ML IV SOLN
2.0000 mg | INTRAVENOUS | Status: DC | PRN
Start: 2015-06-07 — End: 2015-06-07

## 2015-06-07 MED ORDER — SODIUM CHLORIDE 0.9 % IJ SOLN
3.0000 mL | Freq: Two times a day (BID) | INTRAMUSCULAR | Status: DC
  Administered 2015-06-07 – 2015-06-13 (×12): 3 mL via INTRAVENOUS

## 2015-06-07 MED ORDER — NITROGLYCERIN 0.4 MG SL SUBL: 0.4000 mg | SUBLINGUAL_TABLET | SUBLINGUAL | Status: DC | PRN

## 2015-06-07 MED ORDER — HEPARIN BOLUS VIA INFUSION
4000.0000 [IU] | Freq: Once | INTRAVENOUS | Status: DC
  Filled 2015-06-07: qty 4000

## 2015-06-07 MED ORDER — ONDANSETRON HCL 4 MG PO TABS: 4.0000 mg | ORAL_TABLET | Freq: Four times a day (QID) | ORAL | Status: DC | PRN

## 2015-06-07 MED ORDER — HEPARIN (PORCINE) IN NACL 100-0.45 UNIT/ML-% IJ SOLN
1250.0000 [IU]/h | INTRAMUSCULAR | Status: DC
  Administered 2015-06-07: 1250 [IU]/h via INTRAVENOUS
  Filled 2015-06-07: qty 250

## 2015-06-07 MED ORDER — SODIUM CHLORIDE 0.9 % IJ SOLN: 3.0000 mL | Freq: Two times a day (BID) | INTRAMUSCULAR | Status: DC

## 2015-06-07 MED ORDER — CEFAZOLIN SODIUM 1-5 GM-% IV SOLN
1.0000 g | Freq: Three times a day (TID) | INTRAVENOUS | Status: DC
  Administered 2015-06-07 – 2015-06-12 (×15): 1 g via INTRAVENOUS
  Filled 2015-06-07 (×20): qty 50

## 2015-06-07 MED ORDER — ATORVASTATIN CALCIUM 20 MG PO TABS: 20.0000 mg | ORAL_TABLET | Freq: Every day | ORAL | Status: DC

## 2015-06-07 MED ORDER — PRO-STAT SUGAR FREE PO LIQD: 30.0000 mL | Freq: Three times a day (TID) | ORAL | Status: DC

## 2015-06-07 MED ORDER — PANTOPRAZOLE SODIUM 40 MG IV SOLR: 40.0000 mg | Freq: Two times a day (BID) | INTRAVENOUS | Status: DC

## 2015-06-07 MED ORDER — LISINOPRIL 10 MG PO TABS: 10.0000 mg | ORAL_TABLET | Freq: Two times a day (BID) | ORAL | Status: DC

## 2015-06-07 MED ORDER — ONDANSETRON HCL 4 MG/2ML IJ SOLN: 4.0000 mg | Freq: Four times a day (QID) | INTRAMUSCULAR | Status: DC | PRN

## 2015-06-07 MED ORDER — CETYLPYRIDINIUM CHLORIDE 0.05 % MT LIQD
7.0000 mL | Freq: Two times a day (BID) | OROMUCOSAL | Status: DC
Start: 2015-06-07 — End: 2015-06-13
  Administered 2015-06-07 – 2015-06-13 (×13): 7 mL via OROMUCOSAL

## 2015-06-07 MED ORDER — ALBUTEROL SULFATE (2.5 MG/3ML) 0.083% IN NEBU: 2.5000 mg | INHALATION_SOLUTION | Freq: Four times a day (QID) | RESPIRATORY_TRACT | Status: DC

## 2015-06-07 MED ORDER — NICOTINE 7 MG/24HR TD PT24
7.0000 mg | MEDICATED_PATCH | Freq: Every day | TRANSDERMAL | Status: DC
  Administered 2015-06-08 – 2015-06-13 (×6): 7 mg via TRANSDERMAL
  Filled 2015-06-07 (×6): qty 1

## 2015-06-07 MED ORDER — VITAL AF 1.2 CAL PO LIQD
237.0000 mL | Freq: Three times a day (TID) | ORAL | Status: DC
  Administered 2015-06-07 – 2015-06-13 (×17): 237 mL
  Filled 2015-06-07 (×24): qty 237

## 2015-06-07 MED ORDER — SODIUM CHLORIDE 0.9 % IV SOLN
INTRAVENOUS | Status: DC
  Administered 2015-06-08: 08:00:00 via INTRAVENOUS

## 2015-06-07 MED ORDER — MELATONIN 3 MG PO TABS: 6.0000 mg | ORAL_TABLET | Freq: Every day | ORAL | Status: DC

## 2015-06-07 MED ORDER — DEXAMETHASONE SODIUM PHOSPHATE 4 MG/ML IJ SOLN
4.0000 mg | Freq: Once | INTRAMUSCULAR | Status: AC
  Administered 2015-06-07: 4 mg via INTRAVENOUS
  Filled 2015-06-07: qty 1

## 2015-06-07 MED ORDER — POTASSIUM CHLORIDE 20 MEQ/15ML (10%) PO SOLN
20.0000 meq | Freq: Two times a day (BID) | ORAL | Status: DC
  Administered 2015-06-07 – 2015-06-08 (×4): 20 meq
  Filled 2015-06-07 (×4): qty 15

## 2015-06-07 MED ORDER — ALBUTEROL SULFATE (2.5 MG/3ML) 0.083% IN NEBU: 2.5000 mg | INHALATION_SOLUTION | RESPIRATORY_TRACT | Status: DC | PRN

## 2015-06-07 MED ORDER — HEPARIN (PORCINE) IN NACL 100-0.45 UNIT/ML-% IJ SOLN
1200.0000 [IU]/h | INTRAMUSCULAR | Status: AC
  Administered 2015-06-07: 1150 [IU]/h via INTRAVENOUS
  Administered 2015-06-07: 1250 [IU]/h via INTRAVENOUS
  Filled 2015-06-07: qty 250

## 2015-06-07 MED ORDER — FOLIC ACID 1 MG PO TABS
1.0000 mg | ORAL_TABLET | Freq: Every day | ORAL | Status: DC
  Administered 2015-06-07 – 2015-06-13 (×7): 1 mg via ORAL
  Filled 2015-06-07 (×7): qty 1

## 2015-06-07 MED ORDER — IOHEXOL 350 MG/ML SOLN
80.0000 mL | Freq: Once | INTRAVENOUS | Status: AC | PRN
  Administered 2015-06-07: 100 mL via INTRAVENOUS

## 2015-06-07 MED ORDER — AMLODIPINE BESYLATE 5 MG PO TABS: 5.0000 mg | ORAL_TABLET | Freq: Every day | ORAL | Status: DC

## 2015-06-07 MED ORDER — FREE WATER
200.0000 mL | Freq: Three times a day (TID) | Status: DC
  Administered 2015-06-07 – 2015-06-13 (×18): 200 mL

## 2015-06-07 MED ORDER — BISACODYL 10 MG RE SUPP: 10.0000 mg | Freq: Every day | RECTAL | Status: DC | PRN

## 2015-06-07 MED ORDER — CEFAZOLIN SODIUM 1-5 GM-% IV SOLN: 1.0000 g | Freq: Three times a day (TID) | INTRAVENOUS | Status: DC

## 2015-06-07 MED ORDER — ENSURE PUDDING PO PUDG: 1.0000 | Freq: Three times a day (TID) | ORAL | Status: DC

## 2015-06-07 MED ORDER — VITAMIN B-1 100 MG PO TABS
100.0000 mg | ORAL_TABLET | Freq: Every day | ORAL | Status: DC
  Administered 2015-06-08 – 2015-06-13 (×6): 100 mg via ORAL
  Filled 2015-06-07 (×7): qty 1

## 2015-06-07 MED ORDER — PANTOPRAZOLE SODIUM 40 MG PO PACK: 40.0000 mg | PACK | ORAL | Status: DC

## 2015-06-07 MED ORDER — INSULIN ASPART 100 UNIT/ML ~~LOC~~ SOLN: 0.0000 [IU] | Freq: Three times a day (TID) | SUBCUTANEOUS | Status: DC

## 2015-06-07 MED ORDER — SODIUM CHLORIDE 0.9 % IV BOLUS (SEPSIS)
500.0000 mL | Freq: Once | INTRAVENOUS | Status: AC
  Administered 2015-06-07: 500 mL via INTRAVENOUS

## 2015-06-07 MED ORDER — ADULT MULTIVITAMIN W/MINERALS CH
1.0000 | ORAL_TABLET | Freq: Every day | ORAL | Status: DC
  Administered 2015-06-08 – 2015-06-13 (×6): 1 via ORAL
  Filled 2015-06-07 (×7): qty 1

## 2015-06-07 MED ORDER — IPRATROPIUM-ALBUTEROL 0.5-2.5 (3) MG/3ML IN SOLN: 3.0000 mL | Freq: Four times a day (QID) | RESPIRATORY_TRACT | Status: DC | PRN

## 2015-06-07 MED ORDER — ATORVASTATIN CALCIUM 40 MG PO TABS
40.0000 mg | ORAL_TABLET | Freq: Every day | ORAL | Status: DC
  Administered 2015-06-07 – 2015-06-13 (×7): 40 mg via ORAL
  Filled 2015-06-07 (×7): qty 1

## 2015-06-07 MED ORDER — HEPARIN BOLUS VIA INFUSION
2000.0000 [IU] | Freq: Once | INTRAVENOUS | Status: AC
  Administered 2015-06-07: 2000 [IU] via INTRAVENOUS
  Filled 2015-06-07: qty 2000

## 2015-06-07 MED ORDER — RACEPINEPHRINE HCL 2.25 % IN NEBU
0.5000 mL | INHALATION_SOLUTION | Freq: Once | RESPIRATORY_TRACT | Status: AC
  Administered 2015-06-07: 0.5 mL via RESPIRATORY_TRACT
  Filled 2015-06-07: qty 0.5

## 2015-06-07 MED ORDER — FAMOTIDINE 20 MG PO TABS: 20.0000 mg | ORAL_TABLET | Freq: Two times a day (BID) | ORAL | Status: DC

## 2015-06-07 MED ORDER — ACETAMINOPHEN 650 MG RE SUPP: 650.0000 mg | Freq: Four times a day (QID) | RECTAL | Status: DC | PRN

## 2015-06-07 MED ORDER — CLOPIDOGREL BISULFATE 75 MG PO TABS
75.0000 mg | ORAL_TABLET | Freq: Every day | ORAL | Status: DC
  Administered 2015-06-07: 75 mg via ORAL
  Filled 2015-06-07 (×2): qty 1

## 2015-06-07 NOTE — Progress Notes (Addendum)
Patient BP at 2030 was 86/56 manuel HR 115, 97.6 temp patient bp meds held, called rapid response  Rapid response came up to assess the patient rectal temp taken 100 bp 97/56. Patient clonidine patch removed by rapid response nurse and lactic acid order by rapid nurse. Recheck patient  BP 79/40 call PA [name] advised her for the cardiology consult note recommendation for CT and  Abnormal labs drawn tonight.  Order put in for a CT and potassium. Rapid called advised to given 250 NS bolus patient BP recheck 98/54. Patient went down to CT, radiologist called back to confirm PE. PA on call notified no new orders given. Rapid response nurse called recommend transfer , 2LNC, PA called recommend transfer order given for 1L of fluid at 100, PA called cardiology, triad and Critical care for acceptance. Critical care order 1L bolus patient bp 111/54, critical care came up to access patient ordered 250 bolus, declined to take patient stated he would call triad hospitialist back, RN awaiting transfer orders, RN called PA stated hadn't heard anything stated she would call hospitalist, hospitalist came up to assess patient PA gave order to discharge patient awaiting bed

## 2015-06-07 NOTE — Progress Notes (Signed)
Social Work  Discharge Note  The overall goal for the admission was met for:   Discharge location: No-TRANSFERRING TO ACUTE MEDICAL ISSUES  Length of Stay: No-22 DAYS  Discharge activity level: No-MIN/MOD LEVEL  Home/community participation: Yes  Services provided included: MD, RD, PT, OT, SLP, RN, CM, TR, Pharmacy and [facility]: [facility]  Follow-up services arranged: Home Health: [facility]-PT, OT, SP, RN,AIDE  Comments (or additional information):PT BEING TRANSFERRED TO STEP DOWN UNIT DUE TO MEDICAL ISSUES-FAMILY EDUCATION ON-GOING REGARDING [employer] DISCHARGE DATE WAS 12/17. FAMILY AWARE OF AMOUNT OF CARE PT REQUIRES AND COMMITTED TO TAKING HER HOME UPON DISCHARGE. HAVE CONTACTED [place]   Patient/Family verbalized understanding of follow-up arrangements: Yes  Individual responsible for coordination of the follow-up plan: THOMAS-HUSBAND  Confirmed correct DME delivered: Elease Hashimoto 06/07/2015    Elease Hashimoto

## 2015-06-07 NOTE — Progress Notes (Signed)
Attempted to call for report twice at 755 am and 923 am.RN unable to take report at this moment,keep monitoring pt. Closely.

## 2015-06-07 NOTE — Progress Notes (Signed)
ANTICOAGULATION CONSULT NOTE - Initial Consult  Pharmacy Consult for Heparin Indication: PE  Allergies  Allergen Reactions  . Codeine     Nausea?-- "made her sick"  . Oxycodone Nausea Only    Sick   . Oxycontin [Oxycodone Hcl] Nausea Only    Sick     Patient Measurements: Weight: 170 lb 13.7 oz (77.5 kg)  Vital Signs: Temp: 99.4 F (37.4 C) (12/15 0103) Temp Source: Axillary (12/15 0103) BP: 91/47 mmHg (12/15 0210) Pulse Rate: 88 (12/15 0210)  Labs:  Recent Labs  06/04/15 1630 06/04/15 2139 06/05/15 0335 06/06/15 1928 06/06/15 2256  HGB  --  11.5* 11.7* 13.6  --   HCT  --  36.0 37.0 42.4  --   PLT  --  238 251 354  --   CREATININE 0.54  --  0.46 0.53  --   TROPONINI  --   --   --  0.46* 0.33*    Estimated Creatinine Clearance: 82.9 mL/min (by C-G formula based on Cr of 0.53).   Medical History: Past Medical History  Diagnosis Date  . Hypertension   . Coronary artery disease   . Diabetes mellitus   . Breast cancer ([place]) 03/10/2007    Left breat  . Degenerative disc disease, cervical   . Degenerative disc disease, lumbar   . Fibromyalgia   . Hyperlipidemia   . Carpal tunnel syndrome on right     Medications:  Prescriptions prior to admission  Medication Sig Dispense Refill Last Dose  . acetaminophen (TYLENOL) 160 MG/5ML solution Place 20.3 mLs (650 mg total) into feeding tube every 4 (four) hours as needed for mild pain, headache or fever. 120 mL 0   . acetaminophen (TYLENOL) 325 MG tablet Take 650 mg by mouth every 6 (six) hours as needed for mild pain (temp > 101).    at unknown  . acetaminophen (TYLENOL) 650 MG suppository Place 1 suppository (650 mg total) rectally every 4 (four) hours as needed for mild pain (temp >/= 99.5). (Patient taking differently: Place 650 mg rectally every 6 (six) hours as needed for mild pain (temp > 101). ) 12 suppository 0  at [facility]  . acetylcysteine (MUCOMYST) 20 % nebulizer solution Take 3 mLs by nebulization every 6  (six) hours. (Patient not taking: Reported on 05/16/2015) 30 mL 12 Completed Course at Unknown time  . albuterol (PROVENTIL) (2.5 MG/3ML) 0.083% nebulizer solution Take 3 mLs (2.5 mg total) by nebulization every 6 (six) hours. 75 mL 12   . Amino Acids-Protein Hydrolys (FEEDING SUPPLEMENT, PRO-STAT SUGAR FREE 64,) LIQD Take 30 mLs by mouth daily.   05/16/2015 at Unknown time  . amLODipine (NORVASC) 10 MG tablet Take 1 tablet (10 mg total) by mouth daily. 30 tablet 2 05/16/2015 at unknown  . antiseptic oral rinse (CPC / CETYLPYRIDINIUM CHLORIDE 0.05%) 0.05 % LIQD solution 7 mLs by Mouth Rinse route 2 times daily at 12 noon and 4 pm. 118 mL 2   . atorvastatin (LIPITOR) 20 MG tablet Take 20 mg by mouth at bedtime.   05/15/2015 at Unknown time  . bisacodyl (DULCOLAX) 10 MG suppository Place 1 suppository (10 mg total) rectally daily as needed for moderate constipation. 12 suppository 0   . cefTAZidime 2 g in dextrose 5 % 50 mL Inject 2 g into the vein every 8 (eight) hours. (Patient not taking: Reported on 05/16/2015) 21 ampule 0 Completed Course at Unknown time  . chlorhexidine (PERIDEX) 0.12 % solution 15 mLs by Mouth Rinse route  2 (two) times daily. 120 mL 0   . cloNIDine (CATAPRES) 0.1 MG tablet Take 0.2 mg by mouth 2 (two) times daily.   05/16/2015 at Unknown time  . cloNIDine (CATAPRES) 0.2 MG tablet Place 1 tablet (0.2 mg total) into feeding tube 2 (two) times daily. 60 tablet 2 05/16/2015 at Unknown time  . clopidogrel (PLAVIX) 75 MG tablet Take 1 tablet (75 mg total) by mouth daily. 30 tablet 2 05/16/2015 at Unknown time  . enoxaparin (LOVENOX) 40 MG/0.4ML injection Inject 40 mg into the skin daily.   05/16/2015 at 1000  . famotidine (PEPCID) 20 MG tablet Take 20 mg by mouth 2 (two) times daily.   05/16/2015 at Unknown time  . feeding supplement, ENSURE, (ENSURE) PUDG Take 1 Container by mouth 3 (three) times daily between meals.   05/16/2015 at Unknown time  . Fish Oil OIL Take 160 mg by mouth  daily. 160 mg per 5 ml   05/16/2015 at Unknown time  . folic acid (FOLVITE) 1 MG tablet Take 1 mg by mouth daily.   05/16/2015 at Unknown time  . glipiZIDE (GLUCOTROL) 5 MG tablet Take by mouth daily before breakfast.   05/16/2015 at Unknown time  . heparin 5000 UNIT/ML injection Inject 1 mL (5,000 Units total) into the skin every 8 (eight) hours. 1 mL 30   . hydrALAZINE (APRESOLINE) 10 MG tablet Take 10 mg by mouth 3 (three) times daily.   05/16/2015 at Unknown time  . hydrALAZINE (APRESOLINE) 20 MG/ML injection Inject 1.25 mLs (25 mg total) into the vein every 4 (four) hours as needed (SBP > 160). 1 mL 30   . hydrALAZINE (APRESOLINE) 50 MG tablet Take 1 tablet (50 mg total) by mouth every 8 (eight) hours. 90 tablet 2   . insulin aspart (NOVOLOG) 100 UNIT/ML injection Inject 0-20 Units into the skin every 4 (four) hours. 10 mL 11   . insulin aspart (NOVOLOG) 100 UNIT/ML injection Inject 3 Units into the skin every 4 (four) hours. 10 mL 11   . insulin glargine (LANTUS) 100 UNIT/ML injection Inject 24 Units into the skin 2 (two) times daily. Hold if NPO   05/16/2015 at Unknown time  . insulin lispro (HUMALOG) 100 UNIT/ML injection Inject 0-16 Units into the skin 3 (three) times daily before meals. Sliding scale at [facility]: CBG 75-150 = none; CBG 151-200 = 4 units; CBG 201-250 = 8 units; CBG 251-300 = 10 units; CBG 301-350 = 12 units; CBG 351-400 = 16 units; CBG >400 = 16 units and call MD and obtain stat lab verification    at unknown  . ipratropium-albuterol (DUONEB) 0.5-2.5 (3) MG/3ML SOLN Take 3 mLs by nebulization every 6 (six) hours as needed (for shortness of breath or wheezing).    at unknown  . lisinopril (PRINIVIL,ZESTRIL) 10 MG tablet Take 10 mg by mouth 2 (two) times daily.   05/16/2015 at Unknown time  . lisinopril (PRINIVIL,ZESTRIL) 20 MG tablet Take 1 tablet (20 mg total) by mouth 2 (two) times daily. 60 tablet 2   . Melatonin 3 MG TABS Take 6 mg by mouth at bedtime.   05/15/2015 at Unknown  time  . Multiple Vitamins-Minerals (MULTIVITAMINS THER. W/MINERALS) TABS tablet Take 1 tablet by mouth daily.   05/16/2015 at Unknown time  . nicotine (NICODERM CQ - DOSED IN MG/24 HR) 7 mg/24hr patch Place 7 mg onto the skin daily.   05/16/2015 at Unknown time  . nitroGLYCERIN (NITROSTAT) 0.4 MG SL tablet Place 0.4 mg under  the tongue every 5 (five) minutes as needed for chest pain.    at unknown  . Nutritional Supplements (FEEDING SUPPLEMENT, VITAL AF 1.2 CAL,) LIQD Place 1,000 mLs into feeding tube continuous. 1000 mL 12   . ondansetron (ZOFRAN) 4 MG tablet Take 4 mg by mouth every 6 (six) hours as needed for nausea or vomiting.    at unknown  . ondansetron (ZOFRAN) 4 MG/2ML SOLN injection Inject 4 mg into the vein every 6 (six) hours as needed for nausea or vomiting.    at unknown  . pantoprazole sodium (PROTONIX) 40 mg/20 mL PACK Place 20 mLs (40 mg total) into feeding tube daily. 30 each 30   . potassium chloride 20 MEQ/15ML (10%) SOLN Place 15 mLs (20 mEq total) into feeding tube 2 (two) times daily. 900 mL 0   . pravastatin (PRAVACHOL) 40 MG tablet Take 40 mg by mouth daily.   Past Week at Unknown time  . senna-docusate (SENNA PLUS) 8.6-50 MG tablet Take 1 tablet by mouth 2 (two) times daily as needed for mild constipation.    at unknown  . sennosides (SENOKOT) 8.8 MG/5ML syrup Place 5 mLs into feeding tube 2 (two) times daily as needed for mild constipation. 240 mL 0   . sodium chloride 0.9 % infusion Inject 1,000 mLs into the vein continuous. 1000 mL prn   . sodium chloride 0.9 % injection 10-40 mLs by Intracatheter route every 12 (twelve) hours. 5 mL 60   . sodium chloride 0.9 % injection 10-40 mLs by Intracatheter route as needed (flush). 5 mL 60   . thiamine 100 MG tablet Take 100 mg by mouth daily.   05/16/2015 at Unknown time  . vancomycin (VANCOCIN) 1 GM/200ML SOLN Inject 200 mLs (1,000 mg total) into the vein every 8 (eight) hours. (Patient not taking: Reported on 05/16/2015) 4000 mL  30 Completed Course at Unknown time  . zinc sulfate 220 MG capsule Take 220 mg by mouth daily at 6 PM.   05/16/2015 at Unknown time    Assessment: 59 y.o. F on rehab after recent hemorrhagic stroke (October 2016) now has PE on CT scan. See Dr. Chase Caller note 12/15 0240 re: discussion with Dr. Nicole Kindred, neurologist, and ok to anticoagulate with heparin for PE but no systemic lysis. To begin heparin for R lower lobe PE. No evidence of R heart strain per CT report. CBC ok on admission. Pt received Lovenox 40mg  yesterday at 2100.  Goal of Therapy:  Heparin level 0.3-0.7 units/ml Monitor platelets by anticoagulation protocol: Yes   Plan:  D/c Lovenox Heparin IV bolus 2000 units (will give ~half bolus due to Lovenox being given last night) Heparin gtt at 1250 units/hr Will f/u heparin level in 6 hours Daily heparin level and CBC  Sherlon Handing, PharmD, BCPS Clinical pharmacist, pager [phone] 06/07/2015,3:41 AM

## 2015-06-07 NOTE — Discharge Summary (Signed)
Physician Discharge Summary  Patient ID: Kerry [name] MRN: [id number] DOB/AGE: 1955-09-05 59 y.o.  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 (CVA) ([place])   Essential hypertension   Coronary artery disease involving native coronary artery of native heart with angina pectoris ([place])   History of breast cancer   Fibromyalgia   Chronic pain syndrome   Generalized anxiety disorder   Prerenal azotemia   Vomiting blood   Sinus tachycardia ([place])   PE (pulmonary embolism)   Pulmonary embolism without acute cor pulmonale ([place])   Discharged Condition:  Guarded  Significant Diagnostic Studies: Dg Chest 2 View  06/05/2015  CLINICAL DATA:  Shortness of breath with exertion, personal history of breast cancer, patient smokes EXAM: CHEST  2 VIEW COMPARISON:  05/14/2015 FINDINGS: Stable mild cardiac enlargement. Vascular pattern normal. No consolidation or effusion. IMPRESSION: No active cardiopulmonary disease. Electronically Signed   By: Skipper Cliche M.D.   On: 06/05/2015 10:39   Ct Angio Chest Pe W/cm &/or Wo Cm  06/07/2015  CLINICAL DATA:  Tachycardia. EKG with sinus tachycardia and T-wave abnormality. EXAM: CT ANGIOGRAPHY CHEST WITH CONTRAST TECHNIQUE: Multidetector CT imaging of the chest was performed using the standard protocol during bolus administration of intravenous contrast. Multiplanar CT image reconstructions and MIPs were obtained to evaluate the vascular anatomy. CONTRAST:  80 mL OMNIPAQUE IOHEXOL 350 MG/ML SOLN COMPARISON:  04/20/2015 FINDINGS: Technically adequate study with good opacification of the central and segmental pulmonary arteries. Filling defects are demonstrated in the right lower lobe posterior subsegmental branch vessels consistent with pulmonary  embolus. This may be acute or chronic. No large central pulmonary emboli. Possible filling defect in the left atrial appendage. Cardiac enlargement with evidence of left ventricular hypertrophy. R 8 RV ratio is less than 1 suggesting no evidence of right heart strain. Small pericardial effusion or thickening. Esophagus is decompressed. No significant lymphadenopathy in the chest. Evaluation of lungs is limited due to respiratory motion artifact. Probable atelectasis in the lung bases. Emphysematous changes in the upper lungs. No pleural effusions. No pneumothorax. Included portions of the upper abdominal organs demonstrate calcified granulomas in the spleen. Degenerative changes in the spine. Review of the MIP images confirms the above findings. IMPRESSION: Subsegmental right lower lobe pulmonary emboli. No evidence of right heart strain. Possible filling defect in the left atrial appendage. Cardiac enlargement with left ventricular hypertrophy. No evidence of active pulmonary disease. These results were called by telephone at the time of interpretation on 06/07/2015 at 12:44 am to [facility], the patient's nurse on [facility]-4w, who verbally acknowledged these results. Electronically Signed   By: Lucienne Capers M.D.   On: 06/07/2015 00:47   Dg Chest Port 1 View  05/14/2015  CLINICAL DATA:  Respiratory failure. Tracheostomy. Status post decannulation. EXAM: PORTABLE CHEST 1 VIEW COMPARISON:  04/27/2015 FINDINGS: Tracheostomy tube and right arm PICC line have been removed. The patient is partially rotated to the right. Both lungs are clear. No evidence of pneumothorax or pleural effusion. Heart size is stable. IMPRESSION: No active disease. Electronically Signed   By: Earle Gell M.D.   On: 05/14/2015 13:53   Dg Abd Portable 1v  06/04/2015  CLINICAL DATA:  Hematemesis. EXAM: PORTABLE ABDOMEN - 1 VIEW COMPARISON:  04/29/2015 FINDINGS: No evidence of bowel obstruction or ileus. Tube overlying the right abdomen appears  to represent a gastrostomy tube. The region of the stomach is cut off at the top of the film. IMPRESSION: No evidence of bowel obstruction or ileus. Electronically Signed   By: Aletta Edouard M.D.   On: 06/04/2015 17:22   Labs:  Basic Metabolic Panel:  Recent Labs Lab 06/04/15 1630 06/05/15 0335 06/06/15 1928  NA 139 138 138  K 3.3* 3.5 3.1*  CL 100* 99* 97*  CO2 29 29 29   GLUCOSE 111* 88 171*  BUN 12 8 18   CREATININE 0.54 0.46 0.53  CALCIUM 9.6 9.3 9.3    CBC:  Recent Labs Lab 06/04/15 2139 06/05/15 0335 06/06/15 1928  WBC 8.9 7.6 15.4*  NEUTROABS  --   --  10.5*  HGB 11.5* 11.7* 13.6  HCT 36.0 37.0 42.4  MCV 87.4 87.9 86.9  PLT 238 251 354    CBG:  Recent Labs Lab 06/06/15 0653 06/06/15 1204 06/06/15 1649 06/06/15 2140 06/07/15 0652  GLUCAP 156* 182* 175* 118* 128*    Brief HPI:   Kerry [name] is a 59 y.o. female with history of breast cancer, HTN, CAD, chronic pain, DM type 2, non-compliance who was admitted on 04/11/15 with difficulty speaking, right facial and right sided weakness. Work up revealed L-MCA infarct and TPA started but she had worsening of right hemiparesis with lethargy. Repeat CT negative for bleed and she underwent cerebral angio showed atherosclerotic occlusion of L-ICA and was treated with embolectomy and stent placement and TICI revascularization. Post procedure developed acute respiratory failure with dense right hemiparesis with global aphasia. She had complicated medical stay with requiring trach due to difficulty with vent wean as well as PEG for nutritional support. She was transferred to [facility]  on 11/02 for medical management and rehab. Respiratory status has been stable and she was decannulated on 11/21 and started on dysphagia 1, honey liquids with tube feeds as supplement. She was showing improvement in activity tolerance and CIR was recommended for follow up therapy.  Patient with resultant right hemiparesis with sensory  deficits, expressive > receptive aphasia with apraxia and dysphagia. She was felt to be a good candidate for intensive rehab program and was cleared for discharge to rehab.     Hospital Course: LUV CAIRES was admitted to rehab 05/16/2015 for inpatient therapies to consist of PT, ST and OT at least three hours five days a week. Past admission physiatrist, therapy team and rehab RN have worked together to provide customized collaborative inpatient rehab.  Blood pressures were monitored on bid basis and were relatively stable.  Diabetes was monitored with ac/hs cbg checks and BS were managed with use of levemir bid and glucotrol. Tube feeds were held to help with po intake. Renal status has been monitored and water flushes were increased to help manage dehydration. Respiratory status was monitored with activity and has been stable. She continued to make humming-stridor type sounds question due to apraxia throughout her stay.  Prior trach site has healed well and PEG site is clean and dry without signs of infection.  Family expressed concerns about adequate management of patient's chronic back pain and therefore low dose tramadol was scheduled on qid basis.    She has had improvement in swallow function and follow up MBS was done on 12/14 showing improvement in swallow and she was advanced to dysphagia 2, nectar liquids and water protocol started with ST only.  She has had issues with intermittent nose bleeds as well as indigestion with occasional bouts of  nausea/vomiting when fatigued.  On  12/12, patient vomited black emesis and about 80 cc of black coffee ground fluid was aspirated from PEG. Patient expressed signs of abdominal discomfort therefore she was made NPO. Fluid was heme positive on testing. Serial CBC was ordered for monitoring H/H and plavix and lovenox was placed on hold. Dr. Michail Sermon was consulted for input and recommended conservative management as H/H was stable. To continue IV protonix  every 12 hours and re-flush PEG in am to monitor if continuation of black fluid.  On 12/13 am check revealed that this has resolved and diet was resumed.  she was also started on bolus tube feeds due to poor po intake. Her blood pressures were trending upwards therefor catapress patch was added for better control.   Family education was started this week in anticipation of discharge to home. Team has expressed concerns about the amount of care needed but family felt that they could adequately provide this. On 12/14 pm patient was noted to have tachycardia with heart rate in 120s. EKG showed ST changes and cardiac enzymes were ordered for workup. Cardiology recommended checking D-dimer which was positive.  CTA revealed RLL PE. Patient developed hypotension later that evening requiring multiple fluid boluses.  Catapres patch was removed and hydralazine was held. [facility] was contacted for assistance and transfer due to need for closer monitoring. PCCM consulted neurology who cleared patient to start IV heparin for treatment. She was discharged to step down unit on 06/07/15 am.   During patient's stay in rehab weekly team conferences were held to monitor patient's progress, set goals and discuss barriers to discharge. At admission patient required moderate to max assist +2 for mobility and  Patient has had improvement in activity tolerance, balance, postural control, as well as ability to compensate for deficits. She is able to complete bathing and upper body dressing with min assist. She requires min to moderate assistance for transfers. She requires mod to max assist with min to moderate cues to comprehend basic information, she is able to express basic needs using single word. She shows 90% accuracy to point to written Yes/No for simple questions.  Family was educated on ADLs, mobility, diet and safe swallow strategies as well as appropriate food textures.    Disposition:  Step down unit  Diet:  Dysphagia 2, nectar liquids. Full supervision   Discharge Instructions    Ambulatory referral to Physical Medicine Rehab    Complete by:  As directed   Stroke follow up in 4 weeks.            Medication List    STOP taking these medications        acetylcysteine 20 % nebulizer solution  Commonly known as:  MUCOMYST     albuterol (2.5 MG/3ML) 0.083% nebulizer solution  Commonly known as:  PROVENTIL     amLODipine 10 MG tablet  Commonly known as:  NORVASC     antiseptic oral rinse 0.05 % Liqd solution  Commonly known as:  CPC / CETYLPYRIDINIUM CHLORIDE 0.05%     bisacodyl 10 MG suppository  Commonly known as:  DULCOLAX     chlorhexidine 0.12 % solution  Commonly known as:  PERIDEX     cloNIDine 0.1 MG tablet  Commonly known as:  CATAPRES     cloNIDine 0.2 MG tablet  Commonly known as:  CATAPRES     enoxaparin 40 MG/0.4ML injection  Commonly known as:  LOVENOX     famotidine 20 MG tablet  Commonly known as:  PEPCID     feeding supplement (PRO-STAT SUGAR FREE 64) Liqd     Fish Oil Oil     heparin 5000 UNIT/ML injection     hydrALAZINE 10 MG tablet  Commonly known as:  APRESOLINE     hydrALAZINE 20 MG/ML injection  Commonly known as:  APRESOLINE     hydrALAZINE 50 MG tablet  Commonly known as:  APRESOLINE     insulin aspart 100 UNIT/ML injection  Commonly known as:  novoLOG     insulin glargine 100 UNIT/ML injection  Commonly known as:  LANTUS     insulin lispro 100 UNIT/ML injection  Commonly known as:  HUMALOG     ipratropium-albuterol 0.5-2.5 (3) MG/3ML Soln  Commonly known as:  DUONEB     lisinopril 10 MG tablet  Commonly known as:  PRINIVIL,ZESTRIL     lisinopril 20 MG tablet  Commonly known as:  PRINIVIL,ZESTRIL     nicotine 7 mg/24hr patch  Commonly known as:  NICODERM CQ - dosed in mg/24 hr     nitroGLYCERIN 0.4 MG SL tablet  Commonly known as:  NITROSTAT     ondansetron 4 MG tablet  Commonly known as:  ZOFRAN     ondansetron 4  MG/2ML Soln injection  Commonly known as:  ZOFRAN     pantoprazole sodium 40 mg/20 mL Pack  Commonly known as:  PROTONIX     potassium chloride 20 MEQ/15ML (10%) Soln     pravastatin 40 MG tablet  Commonly known as:  PRAVACHOL     sennosides 8.8 MG/5ML syrup  Commonly known as:  SENOKOT     sodium chloride 0.9 % infusion     sodium chloride 0.9 % injection     vancomycin 1 GM/200ML Soln  Commonly known as:  VANCOCIN      TAKE these medications        acetaminophen 160 MG/5ML solution  Commonly known as:  TYLENOL  Take 10.2-20.3 mLs (325-650 mg total) by mouth every 4 (four) hours as needed for mild pain.     atorvastatin 20 MG tablet  Commonly known as:  LIPITOR  Take 20 mg by mouth at bedtime.     cefTAZidime 2 g in dextrose 5 % 50 mL  Inject 2 g into the vein every 8 (eight) hours.     clopidogrel 75 MG tablet  Commonly known as:  PLAVIX  Take 1 tablet (75 mg total) by mouth daily.     feeding supplement (ENSURE) Pudg  Take 1 Container by mouth 3 (three) times daily between meals.     folic acid 1 MG tablet  Commonly known as:  FOLVITE  Take 1 mg by mouth daily.     glipiZIDE 5 MG tablet  Commonly known as:  GLUCOTROL  Take by mouth daily before breakfast.     hydrocerin Crea  Apply 1 application topically 2 (two) times daily. To bilateral feet     Melatonin 3 MG Tabs  Take 6 mg by mouth at bedtime.     multivitamins ther. w/minerals Tabs tablet  Take 1 tablet by mouth daily.     SENNA PLUS 8.6-50 MG tablet  Generic drug:  senna-docusate  Take 1 tablet by mouth 2 (two) times daily as needed for mild constipation.     sodium chloride 0.65 % Soln nasal spray  Commonly known as:  OCEAN  Place 1 spray into both nostrils 3 (three) times daily. To help with nose bleeds  thiamine 100 MG tablet  Take 100 mg by mouth daily.     zinc sulfate 220 MG capsule  Take 220 mg by mouth daily at 6 PM.           Follow-up Information    Follow up with  Charlett Blake, MD.   Specialty:  Physical Medicine and Rehabilitation   Why:  office to call you with follow up appointment   Contact information:   [place] [address] [phone]       Follow up with Cleotis Nipper, MD.   Specialty:  Gastroenterology   Contact information:   [id number] [address] [phone]       Follow up with Thompson Grayer, MD.   Specialty:  Cardiology   Contact information:   [address] [place] [address] [phone]       Signed: Bary Leriche 06/07/2015, 8:30 AM

## 2015-06-07 NOTE — Telephone Encounter (Addendum)
Monica RN called with Ms. Lampron vitals at 1:00 am: Temp: 99.4 blood pressure 82/46 , HR 95 Resp 22 O2 Sat's 95 %. C: Right Lower Lobe  Pulmonary Emboli. I placed a call to Dr. [name] and results of D-dimer and CT: Cardiology wouldn't accept her on their service but will continue to follow. Place a call to [facility] at 1;20 spoke with Pincus Sanes PA she doesn't due the admissions.  Spoke with Dr. Blaine Hamper Hospitalist he felt the patient was too unstable and with her history of hemorrhagic stroke asked if I would called critical care. Placed a call to critical care at 2:00, spoke with Dr. Chase Caller he will evaluate patient and wanted Ms. Kaiser to receive a Normal Saline Bolus 1000cc.  Ordered called to [employer]. Awaiting critical care input. Dr. Chase Caller evaluated patient.  Placed a call to Dr. Blaine Hamper she will be transferred to Hospitalist service. Order given to discharge patient from [facility].

## 2015-06-07 NOTE — Care Management Note (Signed)
Case Management Note  Patient Details  Name: Kerry [name] MRN: [id number] Date of Birth: [birth year]  Subjective/Objective:    Adm w pul embolus                Action/Plan:was at inpt rehab. Recent cva, lives w husband   Expected Discharge Date:                  Expected Discharge Plan:  [place]  In-House Referral:     Discharge planning Services  CM Consult  Post Acute Care Choice:  Durable Medical Equipment, Home Health Choice offered to:     DME Arranged:    DME Agency:     HH Arranged:  RN, PT, OT, Nurse's Aide, Speech Therapy [place] Agency:  [facility]  Status of Service:     Medicare Important Message Given:    Date Medicare IM Given:    Medicare IM give by:    Date Additional Medicare IM Given:    Additional Medicare Important Message give by:     If discussed at [place] of Stay Meetings, dates discussed:    Additional Comments: ur review done. inpt rehab had arranged hhc w adv homecare. Have alerted donna w [facility] of pt's adm to [facility].  Lacretia Leigh, RN 06/07/2015, 2:04 PM

## 2015-06-07 NOTE — Progress Notes (Signed)
Notified husband, Eastlyn Branco, at this time patient being transferred to 2H09.

## 2015-06-07 NOTE — Progress Notes (Signed)
[facility] for Heparin Indication: PE  Allergies  Allergen Reactions  . Codeine     Nausea?-- "made her sick"  . Oxycodone Nausea Only    Sick   . Oxycontin [Oxycodone Hcl] Nausea Only    Sick     Patient Measurements:    Vital Signs: Temp: 98.1 F (36.7 C) (12/15 1154) Temp Source: Oral (12/15 1154) BP: 146/66 mmHg (12/15 1154) Pulse Rate: 83 (12/15 1154)  Labs:  Recent Labs  06/05/15 0335  06/06/15 1928 06/06/15 2256 06/07/15 0443 06/07/15 0836 06/07/15 1216  HGB 11.7*  --  13.6  --   --  11.6* 11.9*  HCT 37.0  --  42.4  --   --  37.4 37.0  PLT 251  --  354  --   --  250 237  APTT  --   --   --   --   --  77*  --   LABPROT  --   --   --   --   --  16.5*  --   INR  --   --   --   --   --  1.32  --   HEPARINUNFRC  --   --   --   --   --   --  0.71*  CREATININE 0.46  --  0.53  --   --  0.48  --   TROPONINI  --   < > 0.46* 0.33* 0.22*  --  0.18*  < > = values in this interval not displayed.  Estimated Creatinine Clearance: 82.9 mL/min (by C-G formula based on Cr of 0.48).   Medical History: Past Medical History  Diagnosis Date  . Hypertension   . Coronary artery disease   . Diabetes mellitus   . Breast cancer ([place]) 03/10/2007    Left breat  . Degenerative disc disease, cervical   . Degenerative disc disease, lumbar   . Fibromyalgia   . Hyperlipidemia   . Carpal tunnel syndrome on right   . PE (pulmonary embolism)   . UTI (lower urinary tract infection)   . GIB (gastrointestinal bleeding)   . Elevated troponin     Medications:  Prescriptions prior to admission  Medication Sig Dispense Refill Last Dose  . acetaminophen (TYLENOL) 160 MG/5ML solution Take 10.2-20.3 mLs (325-650 mg total) by mouth every 4 (four) hours as needed for mild pain. 120 mL 0 Past Week at Unknown time  . atorvastatin (LIPITOR) 20 MG tablet Take 20 mg by mouth at bedtime.   06/06/2015 at Unknown time  . clopidogrel (PLAVIX) 75 MG tablet Take  1 tablet (75 mg total) by mouth daily. 30 tablet 2 06/07/2015 at Unknown time  . feeding supplement, ENSURE, (ENSURE) PUDG Take 1 Container by mouth 3 (three) times daily between meals.   06/06/2015 at Unknown time  . folic acid (FOLVITE) 1 MG tablet Take 1 mg by mouth daily.   06/07/2015 at Unknown time  . glipiZIDE (GLUCOTROL) 5 MG tablet Take by mouth daily before breakfast.   06/07/2015 at Unknown time  . hydrocerin (EUCERIN) CREA Apply 1 application topically 2 (two) times daily. To bilateral feet  0 06/06/2015 at Unknown time  . Melatonin 3 MG TABS Take 6 mg by mouth at bedtime.   06/06/2015 at Unknown time  . Multiple Vitamins-Minerals (MULTIVITAMINS THER. W/MINERALS) TABS tablet Take 1 tablet by mouth daily.   06/06/2015 at Unknown time  . senna-docusate (SENNA PLUS) 8.6-50 MG tablet  Take 1 tablet by mouth 2 (two) times daily as needed for mild constipation.   Past Week at Unknown time  . sodium chloride (OCEAN) 0.65 % SOLN nasal spray Place 1 spray into both nostrils 3 (three) times daily. To help with nose bleeds  0 06/06/2015 at Unknown time  . thiamine 100 MG tablet Take 100 mg by mouth daily.   06/06/2015 at Unknown time  . zinc sulfate 220 MG capsule Take 220 mg by mouth daily at 6 PM.   06/06/2015 at Unknown time    Assessment: 59 y.o. F on rehab after recent hemorrhagic stroke (October 2016) now has PE on CT scan. See Dr. Chase Caller note 12/15 0240 re: discussion with Dr. Nicole Kindred, neurologist, and ok to anticoagulate with heparin for PE but no systemic lysis. On heparin for R lower lobe PE. No evidence of R heart strain per CT report. CBC ok on admission. Pt received Lovenox 40mg  yesterday at 2100.   HL slightly supratherapeutic at 0.71 on heparin 1250 units/hr.  Goal of Therapy:  Heparin level 0.3-0.7 units/ml Monitor platelets by anticoagulation protocol: Yes   Plan:  Decrease Heparin gtt to 1150 units/hr F/u morning HL to confirm Daily heparin level and CBC and s/sx  bleeding  Joya San, PharmD Clinical Pharmacy Resident Pager # [phone] 06/07/2015 2:21 PM

## 2015-06-07 NOTE — Progress Notes (Signed)
[facility]                                                                              Patient Demographics  Kerry [name], is a 59 y.o. female, DOB - 09-Oct-1955, [id number]  Admit date - 06/07/2015   Admitting Physician Ivor Costa, MD  Outpatient Primary MD for the patient is [name], MD  LOS -    No chief complaint on file.     HPI on 06/07/2015 by Dr. Ivor Costa Kerry [name] is a 59 y.o. female with PMH of hypertension, hyperlipidemia, diabetes mellitus, GERD, CAD, breast cancer (s/P left mastectomy), DDD, carpal tunnel syndrome, recent large left MCA territory infarct with resolving associated hemorrhage after tPA treatment, s/p of PEG placement, right-sided hemiparalysis, currently doing rehab in [place] since 11/23.   Today pt developed tachycardia and hypotension. Cardiology was consulted, Dr. [name] suspected that patient may have PE, which is confirmed by CTA tonight. PCCM was consulted and recommended to start IV heparin. We are asked to admit pt. Pt has aphagia, but can tell that she has shortness of breath and mild chest pain. She does not seem to have abdominal pain or diarrhea. She is currently being treated for UTI with ancef since 12/13. CTA showed subsegmental right lower lobe pulmonary emboli. No evidence of right heart strain. Possible filling defect in the left atrial appendage.  Patient was found to have hypotension with blood pressure 82/46, potassium 3.1, troponin 0.33, lactate 1.1, tachycardia, tachypnea, temperature 99.8, WBC 15.4. Patient is admitted to stepdown bed for further eval and treatment.   Assessment & Plan   Patient admitted earlier today by Dr. Ivor Costa.  Please see full H&P for details. Agree with current assessment and plan.  Acute pulmonary embolism/DVT -As seen on CTA chest -Patient did have transient hypotension which was thought to be likely from sepsis rather than from pulmonary embolism -PCCM was consulted and  appreciated, recommended heparin drip -Echocardiogram pending -Troponin currently trending downward, was 0.46 currently 0.2 to -Lower extremity Doppler: Right focal, acute nonocclusive DVT noted in CFV  Coronary artery disease with elevated troponin -Troponin likely elevated due to pulmonary embolism and demand ischemia -Pending echocardiogram -Audiology consulted and appreciated, signed off -Continue statin, Plavix  Sepsis secondary to Urinary tract infection -Patient was noted to have leukocytosis, tachycardia, tachypnea, hypotension and febrile -Patient is being treated with Ancef -Continue IV fluids and antibiotics -Urine culture showed pansensitive Escherichia coli  Coffee-ground emesis -Gastroenterology was consulted, Dr. Michail Sermon suspected the epistaxis was the cause. EGD was not indicated at that time. It was okay to resume Plavix on 1214 -Continue Protonix -Monitor H&H closely while on IV heparin  CVA -Status post TPA and embolectomy by interventional radiology -Patient was admitted to inpatient rehabilitation -Patient has right-sided paresis as well as aphasia -Status post PEG placement -Continue Plavix and statin -Will speak to neurology regarding anticoagulation along with Plavix  Hyperlipidemia -Last LDL on 04/12/2015 was 133 -Continue statin  Essential hypertension -Patient was hypotensive which improved with fluid bolus -Continue to monitor and hold antihypertensive medications  Hypokalemia -Continue potassium supplementation -Will obtain magnesium level -Continue to monitor BMP  Code Status: Full  Family Communication:  None at bedside  Disposition Plan: Admitted  Time Spent in minutes   30 minutes  Procedures  Lower extremity Doppler  Consults   Cardiology PCCM  DVT Prophylaxis  Heparin  Javar Eshbach D.O. on 06/07/2015 at 1:19 PM  Between 7am to 7pm - Pager - [phone]  After 7pm go to [email or web address] - password [email or web address]  And look for  the night coverage person covering for me after hours  [facility] Office  [phone]

## 2015-06-07 NOTE — Progress Notes (Signed)
Pt seen at bedside 0200, additional fluid bolus started. Per Danella Sensing P.A on call [facility] and PCCM consulted for readmission to [facility]. Verbal order received for fluids already started. Pt sleeping but easily aroused VS  BP 91/47, RR 24, HR 88, po2 94 on RA. 2 LNC placed on Patient. PCCM provider on call to see Pt shortly. RN advised to check VS once bolus completed. Will monitor.

## 2015-06-07 NOTE — Progress Notes (Signed)
This pt had recent hemorrhagic stroke and is currently in rehab. She had severe bradycardia post her CVA requiring a temporary pacer in late October. Because of chest pain and tachycardia, cardiology was consulted. Dr. [name] suspected that patient may have PE, which is confirmed by CTA tonight. I was called to transfer this pt to our service. Currently bp is 82/46, has tachycardia and tachypnea. Given recent history of intracranial hemorrhage, she is complicated regarding blood thinner use. I feel that pt is not stable enough for our service. I asked PA to consult PCCM [phone]) first. I am glad to take care of this pt, if PCCM thinks it is appropriate for admission by [place].  Ivor Costa, MD  [facility] Pager [phone]  If 7PM-7AM, please contact night-coverage [email or web address] Password [email or web address] 06/07/2015, 2:12 AM

## 2015-06-07 NOTE — Progress Notes (Signed)
Call received per floor RN regarding Pt CTA results positive for PE. VS BP 82/46, HR 95, RR 22, Po2 95. Pt in no distress at this time. RN advised to notify Provider on call of results and need for transfer to higher level of care. Also recommend additional fluids for hypotension.

## 2015-06-07 NOTE — Progress Notes (Signed)
[place] [facility]ICINE & REHABILITATION     PROGRESS NOTE    Subjective/Complaints:  Appreciate cardiology, CCM and [facility] IM notes. Pt remains aphasic  No evidence of respiratory distress on O2    Unable to perform ROS due to aphasia  Objective: Vital Signs: Blood pressure 120/56, pulse 84, temperature 97.3 F (36.3 C), temperature source Axillary, resp. rate 22, weight 79.107 kg (174 lb 6.4 oz), SpO2 100 %. Dg Chest 2 View  06/05/2015  CLINICAL DATA:  Shortness of breath with exertion, personal history of breast cancer, patient smokes EXAM: CHEST  2 VIEW COMPARISON:  05/14/2015 FINDINGS: Stable mild cardiac enlargement. Vascular pattern normal. No consolidation or effusion. IMPRESSION: No active cardiopulmonary disease. Electronically Signed   By: Skipper Cliche M.D.   On: 06/05/2015 10:39   Ct Angio Chest Pe W/cm &/or Wo Cm  06/07/2015  CLINICAL DATA:  Tachycardia. EKG with sinus tachycardia and T-wave abnormality. EXAM: CT ANGIOGRAPHY CHEST WITH CONTRAST TECHNIQUE: Multidetector CT imaging of the chest was performed using the standard protocol during bolus administration of intravenous contrast. Multiplanar CT image reconstructions and MIPs were obtained to evaluate the vascular anatomy. CONTRAST:  80 mL OMNIPAQUE IOHEXOL 350 MG/ML SOLN COMPARISON:  04/20/2015 FINDINGS: Technically adequate study with good opacification of the central and segmental pulmonary arteries. Filling defects are demonstrated in the right lower lobe posterior subsegmental branch vessels consistent with pulmonary embolus. This may be acute or chronic. No large central pulmonary emboli. Possible filling defect in the left atrial appendage. Cardiac enlargement with evidence of left ventricular hypertrophy. R 8 RV ratio is less than 1 suggesting no evidence of right heart strain. Small pericardial effusion or thickening. Esophagus is decompressed. No significant lymphadenopathy in the chest. Evaluation of lungs is  limited due to respiratory motion artifact. Probable atelectasis in the lung bases. Emphysematous changes in the upper lungs. No pleural effusions. No pneumothorax. Included portions of the upper abdominal organs demonstrate calcified granulomas in the spleen. Degenerative changes in the spine. Review of the MIP images confirms the above findings. IMPRESSION: Subsegmental right lower lobe pulmonary emboli. No evidence of right heart strain. Possible filling defect in the left atrial appendage. Cardiac enlargement with left ventricular hypertrophy. No evidence of active pulmonary disease. These results were called by telephone at the time of interpretation on 06/07/2015 at 12:44 am to [facility], the patient's nurse on MC-4w, who verbally acknowledged these results. Electronically Signed   By: Lucienne Capers M.D.   On: 06/07/2015 00:47   Dg Swallowing Func-speech Pathology  06/06/2015  Objective Swallowing Evaluation:   Patient Details Name: Kerry [name] MRN: [id number] Date of Birth: 1955/12/08 Today's Date: 06/06/2015 Time: SLP Start Time (ACUTE ONLY): 0902-SLP Stop Time (ACUTE ONLY): 0930 SLP Time Calculation (min) (ACUTE ONLY): 28 min Past Medical History: Past Medical History Diagnosis Date . Hypertension  . Coronary artery disease  . Diabetes mellitus  . Breast cancer ([facility]) 03/10/2007   Left breat . Degenerative disc disease, cervical  . Degenerative disc disease, lumbar  . Fibromyalgia  . Hyperlipidemia  . Carpal tunnel syndrome on right  Past Surgical History: Past Surgical History Procedure Laterality Date . Cardiac surgery   . Appendectomy   . Breast surgery   . Mastectomy Left 03/2007 . Cardiac catheterization  2005 . Vein surgery Right 2005 . Tubal ligation Right  . Radiology with anesthesia N/A 04/11/2015   Procedure: RADIOLOGY WITH ANESTHESIA;  Surgeon: Luanne Bras, MD;  Location: [place];  Service: Radiology;  Laterality: N/A; .  Cardiac catheterization N/A 04/13/2015   Procedure: Temporary  Pacemaker;  Surgeon: Peter M Martinique, MD;  Location: [place] [facility];  Service: Cardiovascular;  Laterality: N/A; . Peg placement N/A 04/18/2015   Procedure: PERCUTANEOUS ENDOSCOPIC GASTROSTOMY (PEG) PLACEMENT;  Surgeon: Judeth Horn, MD;  Location: [place];  Service: General;  Laterality: N/A;  bedside . Esophagogastroduodenoscopy N/A 04/18/2015   Procedure: ESOPHAGOGASTRODUODENOSCOPY (EGD);  Surgeon: Judeth Horn, MD;  Location: [facility] ENDOSCOPY;  Service: General;  Laterality: N/A; HPI: Kerry [name] is a 59 y.o. female who was admitted on 04/11/15 with difficulty speaking, right facial and right sided weakness. CT showed dense L-MCA sign and TPA started but she had worsening of right hemiparesis with lethargy. Post procedure developed acute respiratory failure with dense right hemiparesis with global aphasia. She was vent dependent and developed small patchy areas of hemorrhage within acute Large L-MCA infarct as well as cerebral edema. She continued to have lethargy with decrease in LOC requiring PEG placement by Dr. Hulen Skains and Lurline Idol with BAL performed by Dr Nelda Marseille.  She was weaned to trach collar but continued to have significant secretions requiring frequent suctioning.  She was discharged to [facility] on 11/02 for medical management and rehab. She was decannulated on 11/21.  MBS done 11/14 and she was started on dysphagia 1, honey liquids on 11/21 and tube feeds ongoing as supplement. She was felt to be a good candidate for intensive rehab program and CIR was recommended for follow up therapy.  Repeat objective swallow study ordered today to determine readiness for diet progression.   No Data Recorded Assessment / Plan / Recommendation CHL IP CLINICAL IMPRESSIONS 06/06/2015 Therapy Diagnosis Mild oral phase dysphagia;Moderate pharyngeal phase dysphagia Clinical Impression Pt presents with improvements in swallowing function in comparison to initial MBS.  Pt now presents with a mild oral phase dysphagia and  moderate pharyngeal phase dysphagia with both sensory and motor components.  Oral phase deficits are characterized by right sided labial and lingual weakness which results in weakened manipulation and prolonged oral transit of boluses to the oropharynx.  Decreased pharyngeal sensation and decreased base of tongue retraction also resulted in premature spillage of materials into the pharynx and delayed swallow initiation.  Swallow response was delayed to the vallecula with thickened liquids and solids, and further delayed to the pyriforms with thin liquids.  Delayed swallow initiation resulted in deep, silent penetration of thin liquids.    Pharyngeal weakness also resulted in mild-moderate residuals remaining in the vallecula post swallow.  Vallecular residuals were noted to spill over the epiglottis into the laryngeal vestibule and resulted in high flash penetration of solids during the swallow as well as trace aspiration after the swallow during consumption of thin liquids.   Pt was noted to spontaneously use extra swallows which, in combination with SLP interventions for alternating solids and liquids effectively minimized the amount of residue in the pharynx.  Recommend diet progression to dys 2 textures and nectar thick liquids with full supervision for use of swallowing precautions.  Pt would also benefit from the water protocol to continue working towards further liquids progression.  Prognosis for advancement good with trials of advanced consistencies, SLP follow up for use of safe swallowing strategies, and repeat objective swallow study in 1-2 weeks.   Impact on safety and function Risk for inadequate nutrition/hydration;Moderate aspiration risk   No flowsheet data found.  CHL IP DIET RECOMMENDATION 06/06/2015 SLP Diet Recommendations Dysphagia 2 (Fine chop) solids;Nectar thick liquid Liquid Administration via Cup Medication Administration  Other (Comment) Compensations Minimize environmental  distractions;Slow rate;Small sips/bites;Multiple dry swallows after each bite/sip;Follow solids with liquid Postural Changes --   No flowsheet data found.         CHL IP ORAL PHASE 06/06/2015 Oral Phase Impaired Oral - Pudding Teaspoon -- Oral - Pudding Cup -- Oral - Honey Teaspoon -- Oral - Honey Cup Lingual/palatal residue;Decreased bolus cohesion;Weak lingual manipulation;Reduced posterior propulsion;Delayed oral transit;Premature spillage Oral - Nectar Teaspoon -- Oral - Nectar Cup Weak lingual manipulation;Reduced posterior propulsion;Premature spillage;Delayed oral transit;Decreased bolus cohesion;Lingual/palatal residue Oral - Nectar Straw -- Oral - Thin Teaspoon -- Oral - Thin Cup Weak lingual manipulation;Reduced posterior propulsion;Premature spillage;Delayed oral transit;Decreased bolus cohesion;Lingual/palatal residue Oral - Thin Straw Premature spillage;Lingual/palatal residue;Decreased bolus cohesion Oral - Puree Weak lingual manipulation;Reduced posterior propulsion;Lingual/palatal residue;Premature spillage;Delayed oral transit Oral - Mech Soft -- Oral - Regular Weak lingual manipulation;Reduced posterior propulsion;Lingual/palatal residue;Premature spillage Oral - Multi-Consistency -- Oral - Pill -- Oral Phase - Comment --  CHL IP PHARYNGEAL PHASE 06/06/2015 Pharyngeal Phase Impaired Pharyngeal- Pudding Teaspoon -- Pharyngeal -- Pharyngeal- Pudding Cup -- Pharyngeal -- Pharyngeal- Honey Teaspoon -- Pharyngeal -- Pharyngeal- Honey Cup Delayed swallow initiation-vallecula;Reduced tongue base retraction;Reduced laryngeal elevation;Reduced anterior laryngeal mobility;Pharyngeal residue - valleculae Pharyngeal -- Pharyngeal- Nectar Teaspoon -- Pharyngeal -- Pharyngeal- Nectar Cup Delayed swallow initiation-vallecula;Reduced tongue base retraction;Reduced anterior laryngeal mobility;Reduced laryngeal elevation;Pharyngeal residue - valleculae Pharyngeal -- Pharyngeal- Nectar Straw -- Pharyngeal --  Pharyngeal- Thin Teaspoon -- Pharyngeal -- Pharyngeal- Thin Cup Reduced airway/laryngeal closure;Reduced tongue base retraction;Reduced anterior laryngeal mobility;Reduced laryngeal elevation;Pharyngeal residue - valleculae;Pharyngeal residue - pyriform;Penetration/Aspiration during swallow;Penetration/Apiration after swallow;Delayed swallow initiation-pyriform sinuses Pharyngeal Material enters airway, CONTACTS cords and not ejected out;Material enters airway, passes BELOW cords without attempt by patient to eject out (silent aspiration) Pharyngeal- Thin Straw Compensatory strategies attempted (with notebox);Penetration/Aspiration during swallow;Reduced airway/laryngeal closure;Reduced anterior laryngeal mobility;Reduced laryngeal elevation;Reduced tongue base retraction;Pharyngeal residue - pyriform;Pharyngeal residue - valleculae Pharyngeal Material enters airway, CONTACTS cords and then ejected out Pharyngeal- Puree Delayed swallow initiation-vallecula;Reduced tongue base retraction;Pharyngeal residue - valleculae;Reduced anterior laryngeal mobility Pharyngeal -- Pharyngeal- Mechanical Soft -- Pharyngeal -- Pharyngeal- Regular Delayed swallow initiation-vallecula;Reduced anterior laryngeal mobility;Reduced tongue base retraction;Pharyngeal residue - valleculae Pharyngeal -- Pharyngeal- Multi-consistency -- Pharyngeal -- Pharyngeal- Pill -- Pharyngeal -- Pharyngeal Comment --  No flowsheet data found. Page, Selinda Orion 06/06/2015, 12:37 PM               Recent Labs  06/05/15 0335 06/06/15 1928  WBC 7.6 15.4*  HGB 11.7* 13.6  HCT 37.0 42.4  PLT 251 354    Recent Labs  06/05/15 0335 06/06/15 1928  NA 138 138  K 3.5 3.1*  CL 99* 97*  GLUCOSE 88 171*  BUN 8 18  CREATININE 0.46 0.53  CALCIUM 9.3 9.3   CBG (last 3)   Recent Labs  06/06/15 1649 06/06/15 2140 06/07/15 0652  GLUCAP 175* 118* 128*    Wt Readings from Last 3 Encounters:  06/06/15 79.107 kg (174 lb 6.4 oz)  05/16/15 86.183  kg (190 lb)  04/27/15 92.5 kg (203 lb 14.8 oz)    Physical Exam:  Constitutional: She appears well-developed and well-nourished. No distress.  Laying in bed and comfortable  HENT: Lurline Idol site is healed Head: Normocephalic and atraumatic.  Mouth/Throat: Oropharynx is   Moist still with white film on tongue   Missing upper teeth and multiple lower teeth.  Eyes: Conjunctivae and EOM are normal. Pupils are equal, round, and reactive to light.  Neck: Normal range of motion.  Neck supple.  Cardiovascular: Normal rate and regular rhythm. Exam reveals no friction rub.  Respiratory: Effort normal. No respiratory distress. She makes stridorous sounds when breathing but this is volitional GI: Soft. Bowel sounds are normal. She exhibits no distension. There is no tenderness.  PEG site clean and dry without drainage.  Musculoskeletal: She exhibits no edema or tenderness.  Neurological: She is alert.    Apraxia noted. occasionally with an appropriate y/n nod. Poor oral-motor control Inconsistently follows commands    Unable to perform motor and sensory testing due to severe apraxia , Grossly 4/5 in LUE and LLE.No active movement right upper or right lower extremity  DTRs 3+ on right side    Skin: Skin is warm and dry. No rash noted. She is not diaphoretic. No erythema.  Psychiatric: She is not slowed and not withdrawn. Cognition and memory are impaired. She is noncommunicative. She is inattentive to right.     Assessment/Plan: 1. Right hemiparesis, aphasia, dysphagia after left MCA infarct- had epistaxis 2 days ago and lovenox was held also was made NPO for presumed EGD, pt was cont on plavix.  Pt missed antihypertensive med dose in prep for EGD but was started on catapres patch.  Then developed tachycardia, hypoxia. Evaled by cardiology, d dimer up, CTA showed Left LL PE, evaled by TRH, then CCM- ICU not rec, TRH will be admitting to SDU.  I discussed with accepting MD from [facility].    Function:  Bathing Bathing position   Position: Bed  Bathing parts Body parts bathed by patient: Chest, Abdomen, Front perineal area, Right upper leg, Left upper leg Body parts bathed by helper: Right arm, Left arm, Buttocks, Right lower leg, Left lower leg  Bathing assist Assist Level:  (Mod assist)      Upper Body Dressing/Undressing Upper body dressing   What is the patient wearing?: Hospital gown     Pull over shirt/dress - Perfomed by patient: Thread/unthread left sleeve Pull over shirt/dress - Perfomed by helper: Thread/unthread right sleeve        Upper body assist Assist Level:  (Mod assist)      Lower Body Dressing/Undressing Lower body dressing Lower body dressing/undressing activity did not occur: N/A What is the patient wearing?: Pants, Non-skid slipper socks     Pants- Performed by patient: Thread/unthread left pants leg Pants- Performed by helper: Pull pants up/down, Thread/unthread right pants leg Non-skid slipper socks- Performed by patient: Don/doff right sock Non-skid slipper socks- Performed by helper: Don/doff right sock, Don/doff left sock Socks - Performed by patient: Don/doff right sock Socks - Performed by helper: Don/doff left sock Shoes - Performed by patient: Don/doff left shoe Shoes - Performed by helper: Don/doff right shoe, Don/doff left shoe          Lower body assist Assist for lower body dressing: 2 Helpers      Naval architect activity did not occur: No continent bowel/bladder event   Toileting steps completed by helper: Adjust clothing prior to toileting, Performs perineal hygiene, Adjust clothing after toileting    Toileting assist Assist level: Touching or steadying assistance (Pt.75%)   Transfers Chair/bed transfer   Chair/bed transfer method: Squat pivot Chair/bed transfer assist level: Moderate assist (Pt 50 - 74%/lift or lower) Chair/bed transfer assistive device: Bedrails     Locomotion Ambulation  Ambulation activity did not occur: Safety/medical concerns   Max distance: 20 Assist level: 2 helpers   Oceanographer activity did not occur: Safety/medical concerns (pt in tilt-in-space w/c,  unable to propel) Type: Manual Max wheelchair distance: 50 Assist Level: Moderate assistance (Pt 50 - 74%)  Cognition Comprehension Comprehension assist level: Understands basic 50 - 74% of the time/ requires cueing 25 - 49% of the time  Expression Expression assist level: Expresses basic 25 - 49% of the time/requires cueing 50 - 75% of the time. Uses single words/gestures.  Social Interaction Social Interaction assist level: Interacts appropriately 50 - 74% of the time - May be physically or verbally inappropriate.  Problem Solving Problem solving assist level: Solves basic 25 - 49% of the time - needs direction more than half the time to initiate, plan or complete simple activities  Memory Memory assist level: Recognizes or recalls 25 - 49% of the time/requires cueing 50 - 75% of the time   Medical Problem List and Plan: 1. Functional deficits secondary to Left MCA infarct, Right hemiplegia, apraxia, aphasia, -continue CIR therapies 2. DVT Prophylaxis/Anticoagulation: Pharmaceutical: Lovenox- held for epistaxis and coffee grounds aspirated from PEG flush 3. Chronic DDD/Fibromyalgia/Pain Management: Used oxycodone prn at home per family. Continue tylenol prn for now  -appears comfortable at present 4. Mood: LCSW to follow for evaluation and support as mentation improves.  5. Neuropsych: This patient is not capable of making decisions on her own behalf. 6. Skin/Wound Care: Routine pressure relief measures  -ocean nasal spray for dry nasal mucosa 7. Fluids/Electrolytes/Nutrition:eating quite well with supervision, By mouth intake remains variable,2 feeding bolus one can after meals if less than 50% intake. Also one can at bedtime  -continue H20 flushes through PEG.Will increase volume,  monitor for fluid overload 8. HTN: Monitor BP bid. On Lisinopril, hydralazine resumed  changed catapres to trans dermal 9. DM type 2: Family reports non-compliance with insulin at home. Will monitor BS ac/hs. Continue lantus bid with glipizide BID. Use SSI for elevated BS  -good control at present.cbg's reviewed CBG, well controlled at present,98 this am, monitor hypoglycemia 10. Anxiety disorder: Used Paxil and ativan at home. Monitor as awareness improves.   -resumed low dose paxil--follow up with family about home dose 11. Acute pre-renal azotemia: did require additional bolus of saline   14. Constipation resolved, daily cont BMs 15. Urinary retention which is intermittent. Patient will need to try voiding on a toilet with assistance, Escherichia coli UTI, currently NPO, also question if nausea related to Amoxil 16.  severe dysphagia status post stroke, Continue honey thick liquids, monitor for aspiration, Supplemental water boluses,MBS showed some improvement of swallowing LOS (Days) 22 A FACE TO FACE EVALUATION WAS PERFORMED  Damain Broadus E 06/07/2015 7:11 AM

## 2015-06-07 NOTE — Progress Notes (Signed)
VASCULAR LAB PRELIMINARY  PRELIMINARY  PRELIMINARY  PRELIMINARY  Bilateral lower extremity venous duplex  completed.    Preliminary report:  Right:  Focal, acute, non occlusive DVT noted in the CFV.  No evidence of superficial thrombosis.  No Baker's cyst.  Left:  No evidence of DVT, superficial thrombosis, or Baker's cyst.  Chenay Nesmith, RVT 06/07/2015, 12:00 PM

## 2015-06-07 NOTE — Consult Note (Signed)
Name: Kerry [name] MRN: [id number] DOB: 07/11/1955    ADMISSION DATE:  06/07/2015 CONSULTATION DATE:  06/07/15  REFERRING MD :  Letta Pate  CHIEF COMPLAINT:  Hypotension, RLL PE and Right CFV DVT  BRIEF PATIENT DESCRIPTION: 59 y.o. female  Initially admitted to [facility] 10/19 with L MCA infarct.  She had stenting and embolectomy by IR and was later discharged to [facility] before being admitted to [facility] 11/23.  On 12/15, she had tachycardia, tachypnea, and hypotension and was found to have small RLL PE and Rt CFV DVT.  PCCM called for recs.  SIGNIFICANT EVENTS  10/19 > admitted with L MCA infarct. 10/26 > trach / PEG. 11/4 > discharged to [facility]. 11/23 > admitted to CIR. 12/15 > tachycardia and hypotension > small RLL subsegmental PE found > PCCM consulted.  STUDIES:  CTA chest 12/14 > subsegmental RLL PE.   HISTORY OF PRESENT ILLNESS: Patient is aphasic hence history is obtained from nursing and from chart review.  Kerry [name] is a 59 y.o. female with a PMH as outlined below including recent L MCA infarct s/p tPA (admitted 04/11/15).  She had mild hemorrhage post tPA.  She was then taken to IR for L carotid stent and embolectomy with TICI 2A revascularization, then placed on ASA / plavix.  She had PEG and trach 10/26 (has since been decannulated, unknown date).  She was discharged from [facility] and sent to [facility] 04/27/15.  She was then admitted to CIR at [facility] on 11/23 for continued rehab efforts.  On 12/14, she had tachycardia for which cardiology was consulted. D-dimer was obtained which was positive, prompting CTA of the chest.  CTA revealed small subsegmental RLL PE. Dopplers of the lower extremities done 12/15 show a non-occlusive DVT of the CFV. PCCM was consulted for further recs.    PAST MEDICAL HISTORY :   has a past medical history of Hypertension; Coronary artery disease; Diabetes mellitus; Breast cancer ([place]) (03/10/2007); Degenerative disc disease, cervical; Degenerative disc disease,  lumbar; Fibromyalgia; Hyperlipidemia; Carpal tunnel syndrome on right; PE (pulmonary embolism); UTI (lower urinary tract infection); GIB (gastrointestinal bleeding); and Elevated troponin.  has past surgical history that includes Cardiac surgery; Appendectomy; Breast surgery; Mastectomy (Left, 03/2007); Cardiac catheterization (2005); Vein Surgery (Right, 2005); Tubal ligation (Right); Radiology with anesthesia (N/A, 04/11/2015); Cardiac catheterization (N/A, 04/13/2015); PEG placement (N/A, 04/18/2015); and Esophagogastroduodenoscopy (N/A, 04/18/2015). Prior to Admission medications   Medication Sig Start Date End Date Taking? Authorizing Provider  acetaminophen (TYLENOL) 160 MG/5ML solution Place 20.3 mLs (650 mg total) into feeding tube every 4 (four) hours as needed for mild pain, headache or fever. 04/27/15   Donzetta Starch, NP  acetaminophen (TYLENOL) 160 MG/5ML solution Take 10.2-20.3 mLs (325-650 mg total) by mouth every 4 (four) hours as needed for mild pain. 06/06/15   Bary Leriche, PA-C  acetaminophen (TYLENOL) 325 MG tablet Take 650 mg by mouth every 6 (six) hours as needed for mild pain (temp > 101).   Yes Historical Provider, MD  acetaminophen (TYLENOL) 650 MG suppository Place 1 suppository (650 mg total) rectally every 4 (four) hours as needed for mild pain (temp >/= 99.5). Patient taking differently: Place 650 mg rectally every 6 (six) hours as needed for mild pain (temp > 101).  04/27/15   Donzetta Starch, NP  acetylcysteine (MUCOMYST) 20 % nebulizer solution Take 3 mLs by nebulization every 6 (six) hours. Patient not taking: Reported on 05/16/2015 04/27/15   Donzetta Starch, NP  albuterol (PROVENTIL) (2.5  MG/3ML) 0.083% nebulizer solution Take 3 mLs (2.5 mg total) by nebulization every 6 (six) hours. 04/27/15   Donzetta Starch, NP  Amino Acids-Protein Hydrolys (FEEDING SUPPLEMENT, PRO-STAT SUGAR FREE 64,) LIQD Take 30 mLs by mouth daily.   Yes Historical Provider, MD  amLODipine (NORVASC) 10 MG  tablet Take 1 tablet (10 mg total) by mouth daily. 04/27/15   Donzetta Starch, NP  antiseptic oral rinse (CPC / CETYLPYRIDINIUM CHLORIDE 0.05%) 0.05 % LIQD solution 7 mLs by Mouth Rinse route 2 times daily at 12 noon and 4 pm. 04/27/15   Donzetta Starch, NP  atorvastatin (LIPITOR) 20 MG tablet Take 20 mg by mouth at bedtime.   Yes Historical Provider, MD  bisacodyl (DULCOLAX) 10 MG suppository Place 1 suppository (10 mg total) rectally daily as needed for moderate constipation. 04/27/15   Donzetta Starch, NP  cefTAZidime 2 g in dextrose 5 % 50 mL Inject 2 g into the vein every 8 (eight) hours. Patient not taking: Reported on 05/16/2015 04/27/15   Donzetta Starch, NP  chlorhexidine (PERIDEX) 0.12 % solution 15 mLs by Mouth Rinse route 2 (two) times daily. 04/27/15   Donzetta Starch, NP  cloNIDine (CATAPRES) 0.1 MG tablet Take 0.2 mg by mouth 2 (two) times daily.   Yes Historical Provider, MD  cloNIDine (CATAPRES) 0.2 MG tablet Place 1 tablet (0.2 mg total) into feeding tube 2 (two) times daily. 04/27/15  Yes Donzetta Starch, NP  clopidogrel (PLAVIX) 75 MG tablet Take 1 tablet (75 mg total) by mouth daily. 04/27/15  Yes Donzetta Starch, NP  enoxaparin (LOVENOX) 40 MG/0.4ML injection Inject 40 mg into the skin daily.   Yes Historical Provider, MD  famotidine (PEPCID) 20 MG tablet Take 20 mg by mouth 2 (two) times daily.   Yes Historical Provider, MD  feeding supplement, ENSURE, (ENSURE) PUDG Take 1 Container by mouth 3 (three) times daily between meals.   Yes Historical Provider, MD  Fish Oil OIL Take 160 mg by mouth daily. 160 mg per 5 ml   Yes Historical Provider, MD  folic acid (FOLVITE) 1 MG tablet Take 1 mg by mouth daily.   Yes Historical Provider, MD  glipiZIDE (GLUCOTROL) 5 MG tablet Take by mouth daily before breakfast.   Yes Historical Provider, MD  heparin 5000 UNIT/ML injection Inject 1 mL (5,000 Units total) into the skin every 8 (eight) hours. 04/27/15   Donzetta Starch, NP  hydrALAZINE (APRESOLINE) 10 MG tablet  Take 10 mg by mouth 3 (three) times daily.   Yes Historical Provider, MD  hydrALAZINE (APRESOLINE) 20 MG/ML injection Inject 1.25 mLs (25 mg total) into the vein every 4 (four) hours as needed (SBP > 160). 04/27/15   Donzetta Starch, NP  hydrALAZINE (APRESOLINE) 50 MG tablet Take 1 tablet (50 mg total) by mouth every 8 (eight) hours. 04/27/15   Donzetta Starch, NP  hydrocerin (EUCERIN) CREA Apply 1 application topically 2 (two) times daily. To bilateral feet 06/06/15   Ivan Anchors Love, PA-C  insulin aspart (NOVOLOG) 100 UNIT/ML injection Inject 0-20 Units into the skin every 4 (four) hours. 04/27/15   Donzetta Starch, NP  insulin aspart (NOVOLOG) 100 UNIT/ML injection Inject 3 Units into the skin every 4 (four) hours. 04/27/15   Donzetta Starch, NP  insulin glargine (LANTUS) 100 UNIT/ML injection Inject 24 Units into the skin 2 (two) times daily. Hold if NPO   Yes Historical Provider, MD  insulin lispro (HUMALOG) 100 UNIT/ML  injection Inject 0-16 Units into the skin 3 (three) times daily before meals. Sliding scale at [facility]: CBG 75-150 = none; CBG 151-200 = 4 units; CBG 201-250 = 8 units; CBG 251-300 = 10 units; CBG 301-350 = 12 units; CBG 351-400 = 16 units; CBG >400 = 16 units and call MD and obtain stat lab verification   Yes Historical Provider, MD  ipratropium-albuterol (DUONEB) 0.5-2.5 (3) MG/3ML SOLN Take 3 mLs by nebulization every 6 (six) hours as needed (for shortness of breath or wheezing).   Yes Historical Provider, MD  lisinopril (PRINIVIL,ZESTRIL) 10 MG tablet Take 10 mg by mouth 2 (two) times daily.   Yes Historical Provider, MD  lisinopril (PRINIVIL,ZESTRIL) 20 MG tablet Take 1 tablet (20 mg total) by mouth 2 (two) times daily. 04/27/15   Donzetta Starch, NP  Melatonin 3 MG TABS Take 6 mg by mouth at bedtime.   Yes Historical Provider, MD  Multiple Vitamins-Minerals (MULTIVITAMINS THER. W/MINERALS) TABS tablet Take 1 tablet by mouth daily.   Yes Historical Provider, MD  nicotine (NICODERM CQ - DOSED IN  MG/24 HR) 7 mg/24hr patch Place 7 mg onto the skin daily.   Yes Historical Provider, MD  nitroGLYCERIN (NITROSTAT) 0.4 MG SL tablet Place 0.4 mg under the tongue every 5 (five) minutes as needed for chest pain.   Yes Historical Provider, MD  Nutritional Supplements (FEEDING SUPPLEMENT, VITAL AF 1.2 CAL,) LIQD Place 1,000 mLs into feeding tube continuous. 04/27/15   Donzetta Starch, NP  ondansetron (ZOFRAN) 4 MG tablet Take 4 mg by mouth every 6 (six) hours as needed for nausea or vomiting.   Yes Historical Provider, MD  ondansetron (ZOFRAN) 4 MG/2ML SOLN injection Inject 4 mg into the vein every 6 (six) hours as needed for nausea or vomiting.   Yes Historical Provider, MD  pantoprazole sodium (PROTONIX) 40 mg/20 mL PACK Place 20 mLs (40 mg total) into feeding tube daily. 04/27/15   Donzetta Starch, NP  potassium chloride 20 MEQ/15ML (10%) SOLN Place 15 mLs (20 mEq total) into feeding tube 2 (two) times daily. 04/27/15   Donzetta Starch, NP  pravastatin (PRAVACHOL) 40 MG tablet Take 40 mg by mouth daily.    Historical Provider, MD  senna-docusate (SENNA PLUS) 8.6-50 MG tablet Take 1 tablet by mouth 2 (two) times daily as needed for mild constipation.   Yes Historical Provider, MD  sennosides (SENOKOT) 8.8 MG/5ML syrup Place 5 mLs into feeding tube 2 (two) times daily as needed for mild constipation. 04/27/15   Donzetta Starch, NP  sodium chloride (OCEAN) 0.65 % SOLN nasal spray Place 1 spray into both nostrils 3 (three) times daily. To help with nose bleeds 06/06/15   Ivan Anchors Love, PA-C  sodium chloride 0.9 % infusion Inject 1,000 mLs into the vein continuous. 04/27/15   Donzetta Starch, NP  sodium chloride 0.9 % injection 10-40 mLs by Intracatheter route every 12 (twelve) hours. 04/27/15   Donzetta Starch, NP  sodium chloride 0.9 % injection 10-40 mLs by Intracatheter route as needed (flush). 04/27/15   Donzetta Starch, NP  thiamine 100 MG tablet Take 100 mg by mouth daily.   Yes Historical Provider, MD  vancomycin  (VANCOCIN) 1 GM/200ML SOLN Inject 200 mLs (1,000 mg total) into the vein every 8 (eight) hours. Patient not taking: Reported on 05/16/2015 04/28/15   Donzetta Starch, NP  zinc sulfate 220 MG capsule Take 220 mg by mouth daily at 6 PM.   Yes  Historical Provider, MD   Allergies  Allergen Reactions  . Codeine     Nausea?-- "made her sick"  . Oxycodone Nausea Only    Sick   . Oxycontin [Oxycodone Hcl] Nausea Only    Sick     FAMILY HISTORY:  family history includes Cancer in her cousin, maternal aunt, maternal aunt, and mother; Diabetes in her sister. SOCIAL HISTORY:  reports that she has been smoking.  She has never used smokeless tobacco. She reports that she drinks alcohol. She reports that she does not use illicit drugs.  REVIEW OF SYSTEMS:  Unable to obtain as pt is aphasic and somnolent.   SUBJECTIVE:   VITAL SIGNS: Temp:  [97.3 F (36.3 C)-100 F (37.8 C)] 98.1 F (36.7 C) (12/15 1154) Pulse Rate:  [83-121] 83 (12/15 1154) Resp:  [20-24] 20 (12/15 1154) BP: (78-146)/(40-74) 146/66 mmHg (12/15 1154) SpO2:  [94 %-100 %] 100 % (12/15 1154) Weight:  [79.107 kg (174 lb 6.4 oz)] 79.107 kg (174 lb 6.4 oz) (12/14 1950)  PHYSICAL EXAMINATION: General: Adult female, resting in bed, in respiratory distress HEENT: [place]/AT. PERRL, sclerae anicteric. Cardiovascular: RRR, no M/R/G.  Lungs: Respirations labored with significant use of accessory muscles, audible upper airway stridor (chronic per RN - she apparently breathes like this even when awake). Neuro: Alert and oriented x1; able to follow basic commands, right facial droop, tongue midline,  right hemiplegia, speech is garbled with few audible words. Abdomen: PEG in place.  BS x 4, soft, NT/ND.  Musculoskeletal: No gross deformities, no edema.  Skin: Intact, warm, no rashes.  Recent Labs Lab 06/05/15 0335 06/06/15 1928 06/07/15 0836  NA 138 138 140  K 3.5 3.1* 2.8*  CL 99* 97* 105  CO2 29 29 27   BUN 8 18 18   CREATININE 0.46  0.53 0.48  GLUCOSE 88 171* 129*   Recent Labs Lab 06/05/15 0335 06/06/15 1928 06/07/15 0836  HGB 11.7* 13.6 11.6*  HCT 37.0 42.4 37.4  WBC 7.6 15.4* 10.0  PLT 251 354 250   Ct Angio Chest Pe W/cm &/or Wo Cm  06/07/2015  CLINICAL DATA:  Tachycardia. EKG with sinus tachycardia and T-wave abnormality. EXAM: CT ANGIOGRAPHY CHEST WITH CONTRAST TECHNIQUE: Multidetector CT imaging of the chest was performed using the standard protocol during bolus administration of intravenous contrast. Multiplanar CT image reconstructions and MIPs were obtained to evaluate the vascular anatomy. CONTRAST:  80 mL OMNIPAQUE IOHEXOL 350 MG/ML SOLN COMPARISON:  04/20/2015 FINDINGS: Technically adequate study with good opacification of the central and segmental pulmonary arteries. Filling defects are demonstrated in the right lower lobe posterior subsegmental branch vessels consistent with pulmonary embolus. This may be acute or chronic. No large central pulmonary emboli. Possible filling defect in the left atrial appendage. Cardiac enlargement with evidence of left ventricular hypertrophy. R 8 RV ratio is less than 1 suggesting no evidence of right heart strain. Small pericardial effusion or thickening. Esophagus is decompressed. No significant lymphadenopathy in the chest. Evaluation of lungs is limited due to respiratory motion artifact. Probable atelectasis in the lung bases. Emphysematous changes in the upper lungs. No pleural effusions. No pneumothorax. Included portions of the upper abdominal organs demonstrate calcified granulomas in the spleen. Degenerative changes in the spine. Review of the MIP images confirms the above findings. IMPRESSION: Subsegmental right lower lobe pulmonary emboli. No evidence of right heart strain. Possible filling defect in the left atrial appendage. Cardiac enlargement with left ventricular hypertrophy. No evidence of active pulmonary disease. These results were called  by telephone at the  time of interpretation on 06/07/2015 at 12:44 am to [facility], the patient's nurse on MC-4w, who verbally acknowledged these results. Electronically Signed   By: Lucienne Capers M.D.   On: 06/07/2015 00:47   Dg Swallowing Func-speech Pathology  06/06/2015  Objective Swallowing Evaluation:   Patient Details Name: Kerry [name] MRN: [id number] Date of Birth: January 03, 1956 Today's Date: 06/06/2015 Time: SLP Start Time (ACUTE ONLY): 0902-SLP Stop Time (ACUTE ONLY): 0930 SLP Time Calculation (min) (ACUTE ONLY): 28 min Past Medical History: Past Medical History Diagnosis Date . Hypertension  . Coronary artery disease  . Diabetes mellitus  . Breast cancer ([place]) 03/10/2007   Left breat . Degenerative disc disease, cervical  . Degenerative disc disease, lumbar  . Fibromyalgia  . Hyperlipidemia  . Carpal tunnel syndrome on right  Past Surgical History: Past Surgical History Procedure Laterality Date . Cardiac surgery   . Appendectomy   . Breast surgery   . Mastectomy Left 03/2007 . Cardiac catheterization  2005 . Vein surgery Right 2005 . Tubal ligation Right  . Radiology with anesthesia N/A 04/11/2015   Procedure: RADIOLOGY WITH ANESTHESIA;  Surgeon: Luanne Bras, MD;  Location: [place];  Service: Radiology;  Laterality: N/A; . Cardiac catheterization N/A 04/13/2015   Procedure: Temporary Pacemaker;  Surgeon: Peter M Martinique, MD;  Location: [place] [facility];  Service: Cardiovascular;  Laterality: N/A; . Peg placement N/A 04/18/2015   Procedure: PERCUTANEOUS ENDOSCOPIC GASTROSTOMY (PEG) PLACEMENT;  Surgeon: Judeth Horn, MD;  Location: [place];  Service: General;  Laterality: N/A;  bedside . Esophagogastroduodenoscopy N/A 04/18/2015   Procedure: ESOPHAGOGASTRODUODENOSCOPY (EGD);  Surgeon: Judeth Horn, MD;  Location: [facility] ENDOSCOPY;  Service: General;  Laterality: N/A; HPI: PETRINA ALLSTON is a 59 y.o. female who was admitted on 04/11/15 with difficulty speaking, right facial and right sided weakness. CT showed  dense L-MCA sign and TPA started but she had worsening of right hemiparesis with lethargy. Post procedure developed acute respiratory failure with dense right hemiparesis with global aphasia. She was vent dependent and developed small patchy areas of hemorrhage within acute Large L-MCA infarct as well as cerebral edema. She continued to have lethargy with decrease in LOC requiring PEG placement by Dr. Hulen Skains and Lurline Idol with BAL performed by Dr Nelda Marseille.  She was weaned to trach collar but continued to have significant secretions requiring frequent suctioning.  She was discharged to [facility] on 11/02 for medical management and rehab. She was decannulated on 11/21.  MBS done 11/14 and she was started on dysphagia 1, honey liquids on 11/21 and tube feeds ongoing as supplement. She was felt to be a good candidate for intensive rehab program and CIR was recommended for follow up therapy.  Repeat objective swallow study ordered today to determine readiness for diet progression.   No Data Recorded Assessment / Plan / Recommendation CHL IP CLINICAL IMPRESSIONS 06/06/2015 Therapy Diagnosis Mild oral phase dysphagia;Moderate pharyngeal phase dysphagia Clinical Impression Pt presents with improvements in swallowing function in comparison to initial MBS.  Pt now presents with a mild oral phase dysphagia and moderate pharyngeal phase dysphagia with both sensory and motor components.  Oral phase deficits are characterized by right sided labial and lingual weakness which results in weakened manipulation and prolonged oral transit of boluses to the oropharynx.  Decreased pharyngeal sensation and decreased base of tongue retraction also resulted in premature spillage of materials into the pharynx and delayed swallow initiation.  Swallow response was delayed to the vallecula with thickened liquids  and solids, and further delayed to the pyriforms with thin liquids.  Delayed swallow initiation resulted in deep, silent penetration of thin  liquids.    Pharyngeal weakness also resulted in mild-moderate residuals remaining in the vallecula post swallow.  Vallecular residuals were noted to spill over the epiglottis into the laryngeal vestibule and resulted in high flash penetration of solids during the swallow as well as trace aspiration after the swallow during consumption of thin liquids.   Pt was noted to spontaneously use extra swallows which, in combination with SLP interventions for alternating solids and liquids effectively minimized the amount of residue in the pharynx.  Recommend diet progression to dys 2 textures and nectar thick liquids with full supervision for use of swallowing precautions.  Pt would also benefit from the water protocol to continue working towards further liquids progression.  Prognosis for advancement good with trials of advanced consistencies, SLP follow up for use of safe swallowing strategies, and repeat objective swallow study in 1-2 weeks.   Impact on safety and function Risk for inadequate nutrition/hydration;Moderate aspiration risk   No flowsheet data found.  CHL IP DIET RECOMMENDATION 06/06/2015 SLP Diet Recommendations Dysphagia 2 (Fine chop) solids;Nectar thick liquid Liquid Administration via Cup Medication Administration Other (Comment) Compensations Minimize environmental distractions;Slow rate;Small sips/bites;Multiple dry swallows after each bite/sip;Follow solids with liquid Postural Changes --   No flowsheet data found.         CHL IP ORAL PHASE 06/06/2015 Oral Phase Impaired Oral - Pudding Teaspoon -- Oral - Pudding Cup -- Oral - Honey Teaspoon -- Oral - Honey Cup Lingual/palatal residue;Decreased bolus cohesion;Weak lingual manipulation;Reduced posterior propulsion;Delayed oral transit;Premature spillage Oral - Nectar Teaspoon -- Oral - Nectar Cup Weak lingual manipulation;Reduced posterior propulsion;Premature spillage;Delayed oral transit;Decreased bolus cohesion;Lingual/palatal residue Oral - Nectar  Straw -- Oral - Thin Teaspoon -- Oral - Thin Cup Weak lingual manipulation;Reduced posterior propulsion;Premature spillage;Delayed oral transit;Decreased bolus cohesion;Lingual/palatal residue Oral - Thin Straw Premature spillage;Lingual/palatal residue;Decreased bolus cohesion Oral - Puree Weak lingual manipulation;Reduced posterior propulsion;Lingual/palatal residue;Premature spillage;Delayed oral transit Oral - Mech Soft -- Oral - Regular Weak lingual manipulation;Reduced posterior propulsion;Lingual/palatal residue;Premature spillage Oral - Multi-Consistency -- Oral - Pill -- Oral Phase - Comment --  CHL IP PHARYNGEAL PHASE 06/06/2015 Pharyngeal Phase Impaired Pharyngeal- Pudding Teaspoon -- Pharyngeal -- Pharyngeal- Pudding Cup -- Pharyngeal -- Pharyngeal- Honey Teaspoon -- Pharyngeal -- Pharyngeal- Honey Cup Delayed swallow initiation-vallecula;Reduced tongue base retraction;Reduced laryngeal elevation;Reduced anterior laryngeal mobility;Pharyngeal residue - valleculae Pharyngeal -- Pharyngeal- Nectar Teaspoon -- Pharyngeal -- Pharyngeal- Nectar Cup Delayed swallow initiation-vallecula;Reduced tongue base retraction;Reduced anterior laryngeal mobility;Reduced laryngeal elevation;Pharyngeal residue - valleculae Pharyngeal -- Pharyngeal- Nectar Straw -- Pharyngeal -- Pharyngeal- Thin Teaspoon -- Pharyngeal -- Pharyngeal- Thin Cup Reduced airway/laryngeal closure;Reduced tongue base retraction;Reduced anterior laryngeal mobility;Reduced laryngeal elevation;Pharyngeal residue - valleculae;Pharyngeal residue - pyriform;Penetration/Aspiration during swallow;Penetration/Apiration after swallow;Delayed swallow initiation-pyriform sinuses Pharyngeal Material enters airway, CONTACTS cords and not ejected out;Material enters airway, passes BELOW cords without attempt by patient to eject out (silent aspiration) Pharyngeal- Thin Straw Compensatory strategies attempted (with notebox);Penetration/Aspiration during  swallow;Reduced airway/laryngeal closure;Reduced anterior laryngeal mobility;Reduced laryngeal elevation;Reduced tongue base retraction;Pharyngeal residue - pyriform;Pharyngeal residue - valleculae Pharyngeal Material enters airway, CONTACTS cords and then ejected out Pharyngeal- Puree Delayed swallow initiation-vallecula;Reduced tongue base retraction;Pharyngeal residue - valleculae;Reduced anterior laryngeal mobility Pharyngeal -- Pharyngeal- Mechanical Soft -- Pharyngeal -- Pharyngeal- Regular Delayed swallow initiation-vallecula;Reduced anterior laryngeal mobility;Reduced tongue base retraction;Pharyngeal residue - valleculae Pharyngeal -- Pharyngeal- Multi-consistency -- Pharyngeal -- Pharyngeal- Pill -- Pharyngeal -- Pharyngeal Comment --  No flowsheet data found. Page, Selinda Orion 06/06/2015, 12:37 PM               ASSESSMENT / PLAN This is a 59 yo White female with multiple comorbidities and a long and complicated hospital course presenting with a subsegmental right lower lobe pulmonary embolism without right heart strain and right focal, acute, non occlusive DVT noted in the CFV. She is hemodynamically stable and her respiratory stridor seems to be chronic and not related to this new PE. She is saturating at 100% on 2L . Based on the size of the PE, and  her clinical presentation there is no indication for either  catheter directed tPA or systemic tPA. Also her  recent CVA with hemorrhagic conversion precludes her from long-term oral anticoagulation.   Plan RLL subsegmental PE  and Right non-occlusive DVT of the CFV -Continue heparin gtt -Monitor Platelets and mental status -No indication for  IVC filter at this time -Consult with neurology regarding risks vs. benefits of long term anticoagulation given recent CVA with hemorrhagic conversion s/p tPA.   Audible Stridor -Dexamethasone 4mg  IV X1  -Racepinephrine 0.52ml via neb x 1 -Spoke with patient's husband who is her HCP. He states that  patient remains a full code and he will like her intubated and CPR performed if necessary  Magdalene S. Tukov ANP-BC Pulmonary and [place] Pager: [phone]  Attending Note:  59 year old female with a recent stroke and is essentially aphasic, patient was in rehab when she developed increase WOB that is likely due to vocal cord edema resulting in stridor.  Bedside the exam is significant for audible stridor and patient is working hard to exhale.  I am unable to teach her how to breath through it because I do not think she can understand me.  I reviewed the chest CT myself, PE noted.  Discussed with bedside RN and PCCM-NP.  Acute PE:  - Continue heparin.  - No IVC filter.  - Watch mental status as the patient had a recent CVA post tPA.  Stridor due to vocal cord edema vs paralyzed cords.  - Decadrone.  - Racemic epi.  - No intubation for now but patient is full code.  Hypoxemia:  - Titrate O2 for sat of 88-92%.  - Will need an ambulatory desat study prior to discharge.  AMS due to CVA, not a respiratory, ABG with CO2 normal.  - Avoid sedation.  - Monitor.  Patient seen and examined, agree with above note.  I dictated the care and orders written for this patient under my direction.  Rush Farmer, MD [phone]

## 2015-06-07 NOTE — Progress Notes (Signed)
PROGRESS NOTE  Subjective:   59 yo admitted with a CVA Aphasic Was found to have suden onset of sinus tach.   Was seen by Dr. [name] and was diagnosed with a pulmonary embolus    Objective:    Vital Signs:   Temp:  [97.3 F (36.3 C)-100 F (37.8 C)] 97.3 F (36.3 C) (12/15 0454) Pulse Rate:  [84-121] 84 (12/15 0531) Resp:  [20-24] 22 (12/15 0454) BP: (78-150)/(40-80) 120/56 mmHg (12/15 0531) SpO2:  [94 %-100 %] 100 % (12/15 0454) Weight:  [174 lb 6.4 oz (79.107 kg)] 174 lb 6.4 oz (79.107 kg) (12/14 1950)  Last BM Date: 06/06/15   24-hour weight change: Weight change: 3 lb 8.7 oz (1.607 kg)  Weight trends: Filed Weights   05/23/15 1800 06/06/15 0500 06/06/15 1950  Weight: 182 lb 12.2 oz (82.9 kg) 170 lb 13.7 oz (77.5 kg) 174 lb 6.4 oz (79.107 kg)    Intake/Output:  12/14 0701 - 12/15 0700 In: 120 [P.O.:120] Out: 410 [Urine:410]     Physical Exam: BP 120/56 mmHg  Pulse 84  Temp(Src) 97.3 F (36.3 C) (Axillary)  Resp 22  Wt 174 lb 6.4 oz (79.107 kg)  SpO2 100%  Wt Readings from Last 3 Encounters:  06/06/15 174 lb 6.4 oz (79.107 kg)  05/16/15 190 lb (86.183 kg)  04/27/15 203 lb 14.8 oz (92.5 kg)    General: Vital signs reviewed and noted.   Head: Normocephalic, atraumatic.  Eyes: conjunctivae/corneas clear.  EOM's intact.   Throat: normal  Neck:    Lungs:    clear   Heart:  RR, no longer tachycardic  Abdomen:  Soft, non-tender, non-distended    Extremities: No edema    Neurologic: Follows with head and eyes,  aphasic  Psych: na    Labs: BMET:  Recent Labs  06/05/15 0335 06/06/15 1928  NA 138 138  K 3.5 3.1*  CL 99* 97*  CO2 29 29  GLUCOSE 88 171*  BUN 8 18  CREATININE 0.46 0.53  CALCIUM 9.3 9.3    Liver function tests:  Recent Labs  06/06/15 1928  AST 20  ALT 25  ALKPHOS 74  BILITOT 0.4  PROT 7.0  ALBUMIN 3.2*   No results for input(s): LIPASE, AMYLASE in the last 72 hours.  CBC:  Recent Labs  06/05/15 0335  06/06/15 1928  WBC 7.6 15.4*  NEUTROABS  --  10.5*  HGB 11.7* 13.6  HCT 37.0 42.4  MCV 87.9 86.9  PLT 251 354    Cardiac Enzymes:  Recent Labs  06/06/15 1928 06/06/15 2256 06/07/15 0443  TROPONINI 0.46* 0.33* 0.22*    Coagulation Studies: No results for input(s): LABPROT, INR in the last 72 hours.  Other: Invalid input(s): POCBNP  Recent Labs  06/06/15 1928  DDIMER 0.58*   No results for input(s): HGBA1C in the last 72 hours. No results for input(s): CHOL, HDL, LDLCALC, TRIG, CHOLHDL in the last 72 hours. No results for input(s): TSH, T4TOTAL, T3FREE, THYROIDAB in the last 72 hours.  Invalid input(s): FREET3 No results for input(s): VITAMINB12, FOLATE, FERRITIN, TIBC, IRON, RETICCTPCT in the last 72 hours.   Other results:  EKG  ( personally reviewed )  - last night - sinus tach at 120 , no St or T wave change.  Medications:    Infusions: . sodium chloride 100 mL/hr at 06/07/15 0345  . heparin 1,250 Units/hr (06/07/15 0541)    Scheduled Medications: . antiseptic oral rinse  7  mL Mouth Rinse BID  .  ceFAZolin (ANCEF) IV  1 g Intravenous 3 times per day  . feeding supplement (PRO-STAT SUGAR FREE 64)  30 mL Oral TID BM  . hydrocerin   Topical BID  . insulin aspart  0-5 Units Subcutaneous QHS  . insulin aspart  0-9 Units Subcutaneous TID WC  . multivitamin with minerals  1 tablet Oral Daily  . ondansetron  4 mg Oral Q4H while awake  . pantoprazole sodium  20 mg Per Tube BID  . PARoxetine  10 mg Per Tube Daily  . sodium chloride  1 spray Each Nare TID  . sodium chloride  3 mL Intravenous Q12H  . thiamine  100 mg Oral Daily  . traMADol  25 mg Oral 4 times per day  . 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 Pulmonary embolus:  HR has improved. On heparin PCCM is also seeing. We will sign off.    Disposition:  Length of Stay: 22  Thayer Headings, Brooke Bonito., MD, [facility] 06/07/2015, 8:41 AM Office [phone] Pager [phone]

## 2015-06-07 NOTE — Progress Notes (Signed)
Echocardiogram 2D Echocardiogram has been performed.  Kerry [name] 06/07/2015, 4:19 PM

## 2015-06-07 NOTE — Consult Note (Signed)
Name: Kerry [name] MRN: [id number] DOB: [birth year]    ADMISSION DATE:  05/16/2015 CONSULTATION DATE:  06/07/15  REFERRING MD :  Letta Pate  CHIEF COMPLAINT:  Hypotension  BRIEF PATIENT DESCRIPTION: 59 y.o. female  Initially admitted to [facility] 10/19 with L MCA infarct.  She had stenting and embolectomy by IR and was later discharged to [facility] before being admitted to [facility] 11/23.  On 12/15, she had tachycardia and hypotension and was found to have small RLL PE.  PCCM called for recs.  SIGNIFICANT EVENTS  10/19 > admitted with L MCA infarct. 10/26 > trach / PEG. 11/4 > discharged to [facility] 11/23 > admitted to CIR. 12/15 > tachycardia and hypotension > small RLL subsegmental PE found > PCCM consulted.  STUDIES:  CTA chest 12/14 > subsegmental RLL PE.   HISTORY OF PRESENT ILLNESS: Pt is aphasic and asleep; therefore, this HPI is obtained from chart review.  Kerry [name] is a 59 y.o. female with a PMH as outlined below including recent L MCA infarct s/p tPA (admitted 04/11/15).  She had mild hemorrhage post tPA.  She was then taken to IR for L carotid stent and embolectomy with TICI 2A revascularization, then placed on ASA / plavix.  She had PEG and trach 10/26 (has since been decannulated, unknown date).  She was discharged from [facility] and sent to [facility] 04/27/15.  She was then admitted to CIR at [facility] on 11/23 for continued rehab efforts.  On 12/14, she had tachycardia for which cardiology was consulted. D-dimer was obtained which was positive, prompting CTA of the chest.  CTA revealed small subsegmental RLL PE.  She later had hypotension with SBP in the low 80's.  PCCM was consulted for further recs.  PAST MEDICAL HISTORY :   has a past medical history of Hypertension; Coronary artery disease; Diabetes mellitus; Breast cancer ([place]) (03/10/2007); Degenerative disc disease, cervical; Degenerative disc disease, lumbar; Fibromyalgia; Hyperlipidemia; and Carpal tunnel syndrome on right.  has past  surgical history that includes Cardiac surgery; Appendectomy; Breast surgery; Mastectomy (Left, 03/2007); Cardiac catheterization (2005); Vein Surgery (Right, 2005); Tubal ligation (Right); Radiology with anesthesia (N/A, 04/11/2015); Cardiac catheterization (N/A, 04/13/2015); PEG placement (N/A, 04/18/2015); and Esophagogastroduodenoscopy (N/A, 04/18/2015). Prior to Admission medications   Medication Sig Start Date End Date Taking? Authorizing Provider  acetaminophen (TYLENOL) 160 MG/5ML solution Place 20.3 mLs (650 mg total) into feeding tube every 4 (four) hours as needed for mild pain, headache or fever. 04/27/15   Donzetta Starch, NP  acetaminophen (TYLENOL) 160 MG/5ML solution Take 10.2-20.3 mLs (325-650 mg total) by mouth every 4 (four) hours as needed for mild pain. 06/06/15   Bary Leriche, PA-C  acetaminophen (TYLENOL) 325 MG tablet Take 650 mg by mouth every 6 (six) hours as needed for mild pain (temp > 101).   Yes Historical Provider, MD  acetaminophen (TYLENOL) 650 MG suppository Place 1 suppository (650 mg total) rectally every 4 (four) hours as needed for mild pain (temp >/= 99.5). Patient taking differently: Place 650 mg rectally every 6 (six) hours as needed for mild pain (temp > 101).  04/27/15   Donzetta Starch, NP  acetylcysteine (MUCOMYST) 20 % nebulizer solution Take 3 mLs by nebulization every 6 (six) hours. Patient not taking: Reported on 05/16/2015 04/27/15   Donzetta Starch, NP  albuterol (PROVENTIL) (2.5 MG/3ML) 0.083% nebulizer solution Take 3 mLs (2.5 mg total) by nebulization every 6 (six) hours. 04/27/15   Donzetta Starch, NP  Amino Acids-Protein Hydrolys (FEEDING  SUPPLEMENT, PRO-STAT SUGAR FREE 64,) LIQD Take 30 mLs by mouth daily.   Yes Historical Provider, MD  amLODipine (NORVASC) 10 MG tablet Take 1 tablet (10 mg total) by mouth daily. 04/27/15   Donzetta Starch, NP  antiseptic oral rinse (CPC / CETYLPYRIDINIUM CHLORIDE 0.05%) 0.05 % LIQD solution 7 mLs by Mouth Rinse route 2 times  daily at 12 noon and 4 pm. 04/27/15   Donzetta Starch, NP  atorvastatin (LIPITOR) 20 MG tablet Take 20 mg by mouth at bedtime.   Yes Historical Provider, MD  bisacodyl (DULCOLAX) 10 MG suppository Place 1 suppository (10 mg total) rectally daily as needed for moderate constipation. 04/27/15   Donzetta Starch, NP  cefTAZidime 2 g in dextrose 5 % 50 mL Inject 2 g into the vein every 8 (eight) hours. Patient not taking: Reported on 05/16/2015 04/27/15   Donzetta Starch, NP  chlorhexidine (PERIDEX) 0.12 % solution 15 mLs by Mouth Rinse route 2 (two) times daily. 04/27/15   Donzetta Starch, NP  cloNIDine (CATAPRES) 0.1 MG tablet Take 0.2 mg by mouth 2 (two) times daily.   Yes Historical Provider, MD  cloNIDine (CATAPRES) 0.2 MG tablet Place 1 tablet (0.2 mg total) into feeding tube 2 (two) times daily. 04/27/15  Yes Donzetta Starch, NP  clopidogrel (PLAVIX) 75 MG tablet Take 1 tablet (75 mg total) by mouth daily. 04/27/15  Yes Donzetta Starch, NP  enoxaparin (LOVENOX) 40 MG/0.4ML injection Inject 40 mg into the skin daily.   Yes Historical Provider, MD  famotidine (PEPCID) 20 MG tablet Take 20 mg by mouth 2 (two) times daily.   Yes Historical Provider, MD  feeding supplement, ENSURE, (ENSURE) PUDG Take 1 Container by mouth 3 (three) times daily between meals.   Yes Historical Provider, MD  Fish Oil OIL Take 160 mg by mouth daily. 160 mg per 5 ml   Yes Historical Provider, MD  folic acid (FOLVITE) 1 MG tablet Take 1 mg by mouth daily.   Yes Historical Provider, MD  glipiZIDE (GLUCOTROL) 5 MG tablet Take by mouth daily before breakfast.   Yes Historical Provider, MD  heparin 5000 UNIT/ML injection Inject 1 mL (5,000 Units total) into the skin every 8 (eight) hours. 04/27/15   Donzetta Starch, NP  hydrALAZINE (APRESOLINE) 10 MG tablet Take 10 mg by mouth 3 (three) times daily.   Yes Historical Provider, MD  hydrALAZINE (APRESOLINE) 20 MG/ML injection Inject 1.25 mLs (25 mg total) into the vein every 4 (four) hours as needed  (SBP > 160). 04/27/15   Donzetta Starch, NP  hydrALAZINE (APRESOLINE) 50 MG tablet Take 1 tablet (50 mg total) by mouth every 8 (eight) hours. 04/27/15   Donzetta Starch, NP  hydrocerin (EUCERIN) CREA Apply 1 application topically 2 (two) times daily. To bilateral feet 06/06/15   Ivan Anchors Love, PA-C  insulin aspart (NOVOLOG) 100 UNIT/ML injection Inject 0-20 Units into the skin every 4 (four) hours. 04/27/15   Donzetta Starch, NP  insulin aspart (NOVOLOG) 100 UNIT/ML injection Inject 3 Units into the skin every 4 (four) hours. 04/27/15   Donzetta Starch, NP  insulin glargine (LANTUS) 100 UNIT/ML injection Inject 24 Units into the skin 2 (two) times daily. Hold if NPO   Yes Historical Provider, MD  insulin lispro (HUMALOG) 100 UNIT/ML injection Inject 0-16 Units into the skin 3 (three) times daily before meals. Sliding scale at [facility]: CBG 75-150 = none; CBG 151-200 = 4 units; CBG 201-250 =  8 units; CBG 251-300 = 10 units; CBG 301-350 = 12 units; CBG 351-400 = 16 units; CBG >400 = 16 units and call MD and obtain stat lab verification   Yes Historical Provider, MD  ipratropium-albuterol (DUONEB) 0.5-2.5 (3) MG/3ML SOLN Take 3 mLs by nebulization every 6 (six) hours as needed (for shortness of breath or wheezing).   Yes Historical Provider, MD  lisinopril (PRINIVIL,ZESTRIL) 10 MG tablet Take 10 mg by mouth 2 (two) times daily.   Yes Historical Provider, MD  lisinopril (PRINIVIL,ZESTRIL) 20 MG tablet Take 1 tablet (20 mg total) by mouth 2 (two) times daily. 04/27/15   Donzetta Starch, NP  Melatonin 3 MG TABS Take 6 mg by mouth at bedtime.   Yes Historical Provider, MD  Multiple Vitamins-Minerals (MULTIVITAMINS THER. W/MINERALS) TABS tablet Take 1 tablet by mouth daily.   Yes Historical Provider, MD  nicotine (NICODERM CQ - DOSED IN MG/24 HR) 7 mg/24hr patch Place 7 mg onto the skin daily.   Yes Historical Provider, MD  nitroGLYCERIN (NITROSTAT) 0.4 MG SL tablet Place 0.4 mg under the tongue every 5 (five) minutes as needed  for chest pain.   Yes Historical Provider, MD  Nutritional Supplements (FEEDING SUPPLEMENT, VITAL AF 1.2 CAL,) LIQD Place 1,000 mLs into feeding tube continuous. 04/27/15   Donzetta Starch, NP  ondansetron (ZOFRAN) 4 MG tablet Take 4 mg by mouth every 6 (six) hours as needed for nausea or vomiting.   Yes Historical Provider, MD  ondansetron (ZOFRAN) 4 MG/2ML SOLN injection Inject 4 mg into the vein every 6 (six) hours as needed for nausea or vomiting.   Yes Historical Provider, MD  pantoprazole sodium (PROTONIX) 40 mg/20 mL PACK Place 20 mLs (40 mg total) into feeding tube daily. 04/27/15   Donzetta Starch, NP  potassium chloride 20 MEQ/15ML (10%) SOLN Place 15 mLs (20 mEq total) into feeding tube 2 (two) times daily. 04/27/15   Donzetta Starch, NP  pravastatin (PRAVACHOL) 40 MG tablet Take 40 mg by mouth daily.    Historical Provider, MD  senna-docusate (SENNA PLUS) 8.6-50 MG tablet Take 1 tablet by mouth 2 (two) times daily as needed for mild constipation.   Yes Historical Provider, MD  sennosides (SENOKOT) 8.8 MG/5ML syrup Place 5 mLs into feeding tube 2 (two) times daily as needed for mild constipation. 04/27/15   Donzetta Starch, NP  sodium chloride (OCEAN) 0.65 % SOLN nasal spray Place 1 spray into both nostrils 3 (three) times daily. To help with nose bleeds 06/06/15   Ivan Anchors Love, PA-C  sodium chloride 0.9 % infusion Inject 1,000 mLs into the vein continuous. 04/27/15   Donzetta Starch, NP  sodium chloride 0.9 % injection 10-40 mLs by Intracatheter route every 12 (twelve) hours. 04/27/15   Donzetta Starch, NP  sodium chloride 0.9 % injection 10-40 mLs by Intracatheter route as needed (flush). 04/27/15   Donzetta Starch, NP  thiamine 100 MG tablet Take 100 mg by mouth daily.   Yes Historical Provider, MD  vancomycin (VANCOCIN) 1 GM/200ML SOLN Inject 200 mLs (1,000 mg total) into the vein every 8 (eight) hours. Patient not taking: Reported on 05/16/2015 04/28/15   Donzetta Starch, NP  zinc sulfate 220 MG capsule  Take 220 mg by mouth daily at 6 PM.   Yes Historical Provider, MD   Allergies  Allergen Reactions  . Codeine     Nausea?-- "made her sick"  . Oxycodone Nausea Only    Sick   .  Oxycontin [Oxycodone Hcl] Nausea Only    Sick     FAMILY HISTORY:  family history includes Cancer in her cousin, maternal aunt, maternal aunt, and mother; Diabetes in her sister. SOCIAL HISTORY:  reports that she has been smoking.  She has never used smokeless tobacco. She reports that she drinks alcohol. She reports that she does not use illicit drugs.  REVIEW OF SYSTEMS:  Unable to obtain as pt is aphasic and somnolent.   SUBJECTIVE:   VITAL SIGNS: Temp:  [97.6 F (36.4 C)-100 F (37.8 C)] 99.4 F (37.4 C) (12/15 0103) Pulse Rate:  [88-124] 88 (12/15 0210) Resp:  [20-36] 22 (12/15 0223) BP: (78-150)/(40-92) 91/47 mmHg (12/15 0210) SpO2:  [94 %-100 %] 100 % (12/15 0223) Weight:  [77.5 kg (170 lb 13.7 oz)] 77.5 kg (170 lb 13.7 oz) (12/14 0500)  PHYSICAL EXAMINATION: General: Adult female, resting in bed, in NAD. Neuro: Awakens to voice but falls back to sleep quickly / easily (per RN this is normal for pt when she sleeps). HEENT: [place]/AT. PERRL, sclerae anicteric. Cardiovascular: RRR, no M/R/G.  Lungs: Respirations even and unlabored.  Upper airway stridor noted (chronic per RN - she apparently breathes like this even when awake). Abdomen: PEG in place.  BS x 4, soft, NT/ND.  Musculoskeletal: No gross deformities, no edema.  Skin: Intact, warm, no rashes.     Recent Labs Lab 06/04/15 1630 06/05/15 0335 06/06/15 1928  NA 139 138 138  K 3.3* 3.5 3.1*  CL 100* 99* 97*  CO2 29 29 29   BUN 12 8 18   CREATININE 0.54 0.46 0.53  GLUCOSE 111* 88 171*    Recent Labs Lab 06/04/15 2139 06/05/15 0335 06/06/15 1928  HGB 11.5* 11.7* 13.6  HCT 36.0 37.0 42.4  WBC 8.9 7.6 15.4*  PLT 238 251 354   Dg Chest 2 View  06/05/2015  CLINICAL DATA:  Shortness of breath with exertion, personal history  of breast cancer, patient smokes EXAM: CHEST  2 VIEW COMPARISON:  05/14/2015 FINDINGS: Stable mild cardiac enlargement. Vascular pattern normal. No consolidation or effusion. IMPRESSION: No active cardiopulmonary disease. Electronically Signed   By: Skipper Cliche M.D.   On: 06/05/2015 10:39   Ct Angio Chest Pe W/cm &/or Wo Cm  06/07/2015  CLINICAL DATA:  Tachycardia. EKG with sinus tachycardia and T-wave abnormality. EXAM: CT ANGIOGRAPHY CHEST WITH CONTRAST TECHNIQUE: Multidetector CT imaging of the chest was performed using the standard protocol during bolus administration of intravenous contrast. Multiplanar CT image reconstructions and MIPs were obtained to evaluate the vascular anatomy. CONTRAST:  80 mL OMNIPAQUE IOHEXOL 350 MG/ML SOLN COMPARISON:  04/20/2015 FINDINGS: Technically adequate study with good opacification of the central and segmental pulmonary arteries. Filling defects are demonstrated in the right lower lobe posterior subsegmental branch vessels consistent with pulmonary embolus. This may be acute or chronic. No large central pulmonary emboli. Possible filling defect in the left atrial appendage. Cardiac enlargement with evidence of left ventricular hypertrophy. R 8 RV ratio is less than 1 suggesting no evidence of right heart strain. Small pericardial effusion or thickening. Esophagus is decompressed. No significant lymphadenopathy in the chest. Evaluation of lungs is limited due to respiratory motion artifact. Probable atelectasis in the lung bases. Emphysematous changes in the upper lungs. No pleural effusions. No pneumothorax. Included portions of the upper abdominal organs demonstrate calcified granulomas in the spleen. Degenerative changes in the spine. Review of the MIP images confirms the above findings. IMPRESSION: Subsegmental right lower lobe pulmonary emboli. No evidence  of right heart strain. Possible filling defect in the left atrial appendage. Cardiac enlargement with left  ventricular hypertrophy. No evidence of active pulmonary disease. These results were called by telephone at the time of interpretation on 06/07/2015 at 12:44 am to [facility], the patient's nurse on MC-4w, who verbally acknowledged these results. Electronically Signed   By: Lucienne Capers M.D.   On: 06/07/2015 00:47   Dg Swallowing Func-speech Pathology  06/06/2015  Objective Swallowing Evaluation:   Patient Details Name: KENIYA GAETZ MRN: [id number] Date of Birth: May 08, 1956 Today's Date: 06/06/2015 Time: SLP Start Time (ACUTE ONLY): 0902-SLP Stop Time (ACUTE ONLY): 0930 SLP Time Calculation (min) (ACUTE ONLY): 28 min Past Medical History: Past Medical History Diagnosis Date . Hypertension  . Coronary artery disease  . Diabetes mellitus  . Breast cancer ([place]) 03/10/2007   Left breat . Degenerative disc disease, cervical  . Degenerative disc disease, lumbar  . Fibromyalgia  . Hyperlipidemia  . Carpal tunnel syndrome on right  Past Surgical History: Past Surgical History Procedure Laterality Date . Cardiac surgery   . Appendectomy   . Breast surgery   . Mastectomy Left 03/2007 . Cardiac catheterization  2005 . Vein surgery Right 2005 . Tubal ligation Right  . Radiology with anesthesia N/A 04/11/2015   Procedure: RADIOLOGY WITH ANESTHESIA;  Surgeon: Luanne Bras, MD;  Location: [facility];  Service: Radiology;  Laterality: N/A; . Cardiac catheterization N/A 04/13/2015   Procedure: Temporary Pacemaker;  Surgeon: Peter M Martinique, MD;  Location: [facility];  Service: Cardiovascular;  Laterality: N/A; . Peg placement N/A 04/18/2015   Procedure: PERCUTANEOUS ENDOSCOPIC GASTROSTOMY (PEG) PLACEMENT;  Surgeon: Judeth Horn, MD;  Location: [facility];  Service: General;  Laterality: N/A;  bedside . Esophagogastroduodenoscopy N/A 04/18/2015   Procedure: ESOPHAGOGASTRODUODENOSCOPY (EGD);  Surgeon: Judeth Horn, MD;  Location: [facility] ENDOSCOPY;  Service: General;  Laterality: N/A; HPI: LEXUS ASAD is a 59 y.o.  female who was admitted on 04/11/15 with difficulty speaking, right facial and right sided weakness. CT showed dense L-MCA sign and TPA started but she had worsening of right hemiparesis with lethargy. Post procedure developed acute respiratory failure with dense right hemiparesis with global aphasia. She was vent dependent and developed small patchy areas of hemorrhage within acute Large L-MCA infarct as well as cerebral edema. She continued to have lethargy with decrease in LOC requiring PEG placement by Dr. Hulen Skains and Lurline Idol with BAL performed by Dr Nelda Marseille.  She was weaned to trach collar but continued to have significant secretions requiring frequent suctioning.  She was discharged to [facility] on 11/02 for medical management and rehab. She was decannulated on 11/21.  MBS done 11/14 and she was started on dysphagia 1, honey liquids on 11/21 and tube feeds ongoing as supplement. She was felt to be a good candidate for intensive rehab program and CIR was recommended for follow up therapy.  Repeat objective swallow study ordered today to determine readiness for diet progression.   No Data Recorded Assessment / Plan / Recommendation CHL IP CLINICAL IMPRESSIONS 06/06/2015 Therapy Diagnosis Mild oral phase dysphagia;Moderate pharyngeal phase dysphagia Clinical Impression Pt presents with improvements in swallowing function in comparison to initial MBS.  Pt now presents with a mild oral phase dysphagia and moderate pharyngeal phase dysphagia with both sensory and motor components.  Oral phase deficits are characterized by right sided labial and lingual weakness which results in weakened manipulation and prolonged oral transit of boluses to the oropharynx.  Decreased pharyngeal sensation and decreased base  of tongue retraction also resulted in premature spillage of materials into the pharynx and delayed swallow initiation.  Swallow response was delayed to the vallecula with thickened liquids and solids, and further delayed to  the pyriforms with thin liquids.  Delayed swallow initiation resulted in deep, silent penetration of thin liquids.    Pharyngeal weakness also resulted in mild-moderate residuals remaining in the vallecula post swallow.  Vallecular residuals were noted to spill over the epiglottis into the laryngeal vestibule and resulted in high flash penetration of solids during the swallow as well as trace aspiration after the swallow during consumption of thin liquids.   Pt was noted to spontaneously use extra swallows which, in combination with SLP interventions for alternating solids and liquids effectively minimized the amount of residue in the pharynx.  Recommend diet progression to dys 2 textures and nectar thick liquids with full supervision for use of swallowing precautions.  Pt would also benefit from the water protocol to continue working towards further liquids progression.  Prognosis for advancement good with trials of advanced consistencies, SLP follow up for use of safe swallowing strategies, and repeat objective swallow study in 1-2 weeks.   Impact on safety and function Risk for inadequate nutrition/hydration;Moderate aspiration risk   No flowsheet data found.  CHL IP DIET RECOMMENDATION 06/06/2015 SLP Diet Recommendations Dysphagia 2 (Fine chop) solids;Nectar thick liquid Liquid Administration via Cup Medication Administration Other (Comment) Compensations Minimize environmental distractions;Slow rate;Small sips/bites;Multiple dry swallows after each bite/sip;Follow solids with liquid Postural Changes --   No flowsheet data found.         CHL IP ORAL PHASE 06/06/2015 Oral Phase Impaired Oral - Pudding Teaspoon -- Oral - Pudding Cup -- Oral - Honey Teaspoon -- Oral - Honey Cup Lingual/palatal residue;Decreased bolus cohesion;Weak lingual manipulation;Reduced posterior propulsion;Delayed oral transit;Premature spillage Oral - Nectar Teaspoon -- Oral - Nectar Cup Weak lingual manipulation;Reduced posterior  propulsion;Premature spillage;Delayed oral transit;Decreased bolus cohesion;Lingual/palatal residue Oral - Nectar Straw -- Oral - Thin Teaspoon -- Oral - Thin Cup Weak lingual manipulation;Reduced posterior propulsion;Premature spillage;Delayed oral transit;Decreased bolus cohesion;Lingual/palatal residue Oral - Thin Straw Premature spillage;Lingual/palatal residue;Decreased bolus cohesion Oral - Puree Weak lingual manipulation;Reduced posterior propulsion;Lingual/palatal residue;Premature spillage;Delayed oral transit Oral - Mech Soft -- Oral - Regular Weak lingual manipulation;Reduced posterior propulsion;Lingual/palatal residue;Premature spillage Oral - Multi-Consistency -- Oral - Pill -- Oral Phase - Comment --  CHL IP PHARYNGEAL PHASE 06/06/2015 Pharyngeal Phase Impaired Pharyngeal- Pudding Teaspoon -- Pharyngeal -- Pharyngeal- Pudding Cup -- Pharyngeal -- Pharyngeal- Honey Teaspoon -- Pharyngeal -- Pharyngeal- Honey Cup Delayed swallow initiation-vallecula;Reduced tongue base retraction;Reduced laryngeal elevation;Reduced anterior laryngeal mobility;Pharyngeal residue - valleculae Pharyngeal -- Pharyngeal- Nectar Teaspoon -- Pharyngeal -- Pharyngeal- Nectar Cup Delayed swallow initiation-vallecula;Reduced tongue base retraction;Reduced anterior laryngeal mobility;Reduced laryngeal elevation;Pharyngeal residue - valleculae Pharyngeal -- Pharyngeal- Nectar Straw -- Pharyngeal -- Pharyngeal- Thin Teaspoon -- Pharyngeal -- Pharyngeal- Thin Cup Reduced airway/laryngeal closure;Reduced tongue base retraction;Reduced anterior laryngeal mobility;Reduced laryngeal elevation;Pharyngeal residue - valleculae;Pharyngeal residue - pyriform;Penetration/Aspiration during swallow;Penetration/Apiration after swallow;Delayed swallow initiation-pyriform sinuses Pharyngeal Material enters airway, CONTACTS cords and not ejected out;Material enters airway, passes BELOW cords without attempt by patient to eject out (silent  aspiration) Pharyngeal- Thin Straw Compensatory strategies attempted (with notebox);Penetration/Aspiration during swallow;Reduced airway/laryngeal closure;Reduced anterior laryngeal mobility;Reduced laryngeal elevation;Reduced tongue base retraction;Pharyngeal residue - pyriform;Pharyngeal residue - valleculae Pharyngeal Material enters airway, CONTACTS cords and then ejected out Pharyngeal- Puree Delayed swallow initiation-vallecula;Reduced tongue base retraction;Pharyngeal residue - valleculae;Reduced anterior laryngeal mobility Pharyngeal -- Pharyngeal- Mechanical Soft -- Pharyngeal -- Pharyngeal-  Regular Delayed swallow initiation-vallecula;Reduced anterior laryngeal mobility;Reduced tongue base retraction;Pharyngeal residue - valleculae Pharyngeal -- Pharyngeal- Multi-consistency -- Pharyngeal -- Pharyngeal- Pill -- Pharyngeal -- Pharyngeal Comment --  No flowsheet data found. Page, Selinda Orion 06/06/2015, 12:37 PM               ASSESSMENT / PLAN:  Hypotension - likely multifactorial in setting of dehydration from decreased PO intake and multiple antihypertensive medication regimen.  Doubt small subsegmental PE is contributory.  WBC is mildly elevated but lactic acid is reassuring (1.1).  She has known UTI (pan sensitive E.coli) which she is being treated for.  Of note is that she has responded well to fluid challenge thus far. Plan: Additional 500cc bolus now. MIVF @ 100cc/hr. Clonidine patch removed. Hold additional antihypertensives today 12/15. Continue ancef for UTI. Assess PCT. Vital signs q45min x 2 hours to ensure her hypotension has resolved - if no improvement or worsening then may require transfer to higher level of care.  RLL subsegmental PE - while on ASA and plavix (though these were held for 2 days due to coffee ground emesis).  It is encouraging / reassuring that she has no chest pain and is currently on RA with O2 sats of 99%. Plan: Start Heparin gtt. No systemic or catheter  directed lysis given recent ICH post tPA. Trend troponins. LE duplex - if extensive clot burden then consider IVC filter.  Rest per primary team.  Continue to monitor closely.  She does not require transfer to ICU at this time.   Montey Hora, [facility] Pager: [phone]  or [phone] 06/07/2015, 3:14 AM

## 2015-06-07 NOTE — H&P (Signed)
[facility] History and Physical  Kerry [name] [id number] DOB: [birth year] DOA: (Not on file)  Referring physician: ED physician PCP: [name], MD  Specialists:   Chief Complaint: hypotension and shortness of breath  HPI: Kerry [name] is a 60 y.o. female with PMH of hypertension, hyperlipidemia, diabetes mellitus, GERD, CAD, breast cancer (s/P left mastectomy), DDD, carpal tunnel syndrome, recent large left MCA territory infarct with resolving associated hemorrhage after tPA treatment, s/p of PEG placement, right-sided hemiparalysis, currently doing rehab in [place] since 11/23.   Today pt developed tachycardia and hypotension. Cardiology was consulted, Dr. [name] suspected that patient may have PE, which is confirmed by CTA tonight. PCCM was consulted and recommended to start IV heparin. We are asked to admit pt. Pt has aphagia, but can tell that she has shortness of breath and mild chest pain. She does not seem to have abdominal pain or diarrhea. She is currently being treated for UTI with ancef since 12/13. CTA showed subsegmental right lower lobe pulmonary emboli. No evidence of right heart strain. Possible filling defect in the left atrial appendage.  Patient was found to have hypotension with blood pressure 82/46, potassium 3.1, troponin 0.33, lactate 1.1, tachycardia, tachypnea, temperature 99.8, WBC 15.4. Patient is admitted to stepdown bed for further eval and treatment.   Where does patient live?   At home    Can patient participate in ADLs? none  Review of Systems:  could not be reviewed accurately due to aphagia.   Allergy:  Allergies  Allergen Reactions  . Codeine     Nausea?-- "made her sick"  . Oxycodone Nausea Only    Sick   . Oxycontin [Oxycodone Hcl] Nausea Only    Sick     Past Medical History  Diagnosis Date  . Hypertension   . Coronary artery disease   . Diabetes mellitus   . Breast cancer ([place]) 03/10/2007    Left breat  .  Degenerative disc disease, cervical   . Degenerative disc disease, lumbar   . Fibromyalgia   . Hyperlipidemia   . Carpal tunnel syndrome on right   . PE (pulmonary embolism)   . UTI (lower urinary tract infection)   . GIB (gastrointestinal bleeding)   . Elevated troponin     Past Surgical History  Procedure Laterality Date  . Cardiac surgery    . Appendectomy    . Breast surgery    . Mastectomy Left 03/2007  . Cardiac catheterization  2005  . Vein surgery Right 2005  . Tubal ligation Right   . Radiology with anesthesia N/A 04/11/2015    Procedure: RADIOLOGY WITH ANESTHESIA;  Surgeon: Luanne Bras, MD;  Location: [place];  Service: Radiology;  Laterality: N/A;  . Cardiac catheterization N/A 04/13/2015    Procedure: Temporary Pacemaker;  Surgeon: Peter M Martinique, MD;  Location: [place] [facility];  Service: Cardiovascular;  Laterality: N/A;  . Peg placement N/A 04/18/2015    Procedure: PERCUTANEOUS ENDOSCOPIC GASTROSTOMY (PEG) PLACEMENT;  Surgeon: Judeth Horn, MD;  Location: [place];  Service: General;  Laterality: N/A;  bedside  . Esophagogastroduodenoscopy N/A 04/18/2015    Procedure: ESOPHAGOGASTRODUODENOSCOPY (EGD);  Surgeon: Judeth Horn, MD;  Location: [place];  Service: General;  Laterality: N/A;    Social History:  reports that she has been smoking.  She has never used smokeless tobacco. She reports that she drinks alcohol. She reports that she does not use illicit drugs.  Family History:  Family History  Problem Relation Age of Onset  .  Cancer Mother     Breast cancer (right)  . Diabetes Sister   . Cancer Maternal Aunt     Breast cancer  . Cancer Maternal Aunt     Breast cancer  . Cancer Cousin     Breast Cancer     Prior to Admission medications   Medication Sig Start Date End Date Taking? Authorizing Provider  acetaminophen (TYLENOL) 160 MG/5ML solution Place 20.3 mLs (650 mg total) into feeding tube every 4 (four) hours as needed for mild pain,  headache or fever. 04/27/15   Donzetta Starch, NP  acetaminophen (TYLENOL) 160 MG/5ML solution Take 10.2-20.3 mLs (325-650 mg total) by mouth every 4 (four) hours as needed for mild pain. 06/06/15   Bary Leriche, PA-C  acetaminophen (TYLENOL) 325 MG tablet Take 650 mg by mouth every 6 (six) hours as needed for mild pain (temp > 101).    Historical Provider, MD  acetaminophen (TYLENOL) 650 MG suppository Place 1 suppository (650 mg total) rectally every 4 (four) hours as needed for mild pain (temp >/= 99.5). Patient taking differently: Place 650 mg rectally every 6 (six) hours as needed for mild pain (temp > 101).  04/27/15   Donzetta Starch, NP  acetylcysteine (MUCOMYST) 20 % nebulizer solution Take 3 mLs by nebulization every 6 (six) hours. Patient not taking: Reported on 05/16/2015 04/27/15   Donzetta Starch, NP  albuterol (PROVENTIL) (2.5 MG/3ML) 0.083% nebulizer solution Take 3 mLs (2.5 mg total) by nebulization every 6 (six) hours. 04/27/15   Donzetta Starch, NP  Amino Acids-Protein Hydrolys (FEEDING SUPPLEMENT, PRO-STAT SUGAR FREE 64,) LIQD Take 30 mLs by mouth daily.    Historical Provider, MD  amLODipine (NORVASC) 10 MG tablet Take 1 tablet (10 mg total) by mouth daily. 04/27/15   Donzetta Starch, NP  antiseptic oral rinse (CPC / CETYLPYRIDINIUM CHLORIDE 0.05%) 0.05 % LIQD solution 7 mLs by Mouth Rinse route 2 times daily at 12 noon and 4 pm. 04/27/15   Donzetta Starch, NP  atorvastatin (LIPITOR) 20 MG tablet Take 20 mg by mouth at bedtime.    Historical Provider, MD  bisacodyl (DULCOLAX) 10 MG suppository Place 1 suppository (10 mg total) rectally daily as needed for moderate constipation. 04/27/15   Donzetta Starch, NP  cefTAZidime 2 g in dextrose 5 % 50 mL Inject 2 g into the vein every 8 (eight) hours. Patient not taking: Reported on 05/16/2015 04/27/15   Donzetta Starch, NP  chlorhexidine (PERIDEX) 0.12 % solution 15 mLs by Mouth Rinse route 2 (two) times daily. 04/27/15   Donzetta Starch, NP  cloNIDine  (CATAPRES) 0.1 MG tablet Take 0.2 mg by mouth 2 (two) times daily.    Historical Provider, MD  cloNIDine (CATAPRES) 0.2 MG tablet Place 1 tablet (0.2 mg total) into feeding tube 2 (two) times daily. 04/27/15   Donzetta Starch, NP  clopidogrel (PLAVIX) 75 MG tablet Take 1 tablet (75 mg total) by mouth daily. 04/27/15   Donzetta Starch, NP  enoxaparin (LOVENOX) 40 MG/0.4ML injection Inject 40 mg into the skin daily.    Historical Provider, MD  famotidine (PEPCID) 20 MG tablet Take 20 mg by mouth 2 (two) times daily.    Historical Provider, MD  feeding supplement, ENSURE, (ENSURE) PUDG Take 1 Container by mouth 3 (three) times daily between meals.    Historical Provider, MD  Fish Oil OIL Take 160 mg by mouth daily. 160 mg per 5 ml    Historical  Provider, MD  folic acid (FOLVITE) 1 MG tablet Take 1 mg by mouth daily.    Historical Provider, MD  glipiZIDE (GLUCOTROL) 5 MG tablet Take by mouth daily before breakfast.    Historical Provider, MD  heparin 5000 UNIT/ML injection Inject 1 mL (5,000 Units total) into the skin every 8 (eight) hours. 04/27/15   Donzetta Starch, NP  hydrALAZINE (APRESOLINE) 10 MG tablet Take 10 mg by mouth 3 (three) times daily.    Historical Provider, MD  hydrALAZINE (APRESOLINE) 20 MG/ML injection Inject 1.25 mLs (25 mg total) into the vein every 4 (four) hours as needed (SBP > 160). 04/27/15   Donzetta Starch, NP  hydrALAZINE (APRESOLINE) 50 MG tablet Take 1 tablet (50 mg total) by mouth every 8 (eight) hours. 04/27/15   Donzetta Starch, NP  hydrocerin (EUCERIN) CREA Apply 1 application topically 2 (two) times daily. To bilateral feet 06/06/15   Ivan Anchors Love, PA-C  insulin aspart (NOVOLOG) 100 UNIT/ML injection Inject 0-20 Units into the skin every 4 (four) hours. 04/27/15   Donzetta Starch, NP  insulin aspart (NOVOLOG) 100 UNIT/ML injection Inject 3 Units into the skin every 4 (four) hours. 04/27/15   Donzetta Starch, NP  insulin glargine (LANTUS) 100 UNIT/ML injection Inject 24 Units into the  skin 2 (two) times daily. Hold if NPO    Historical Provider, MD  insulin lispro (HUMALOG) 100 UNIT/ML injection Inject 0-16 Units into the skin 3 (three) times daily before meals. Sliding scale at [facility]: CBG 75-150 = none; CBG 151-200 = 4 units; CBG 201-250 = 8 units; CBG 251-300 = 10 units; CBG 301-350 = 12 units; CBG 351-400 = 16 units; CBG >400 = 16 units and call MD and obtain stat lab verification    Historical Provider, MD  ipratropium-albuterol (DUONEB) 0.5-2.5 (3) MG/3ML SOLN Take 3 mLs by nebulization every 6 (six) hours as needed (for shortness of breath or wheezing).    Historical Provider, MD  lisinopril (PRINIVIL,ZESTRIL) 10 MG tablet Take 10 mg by mouth 2 (two) times daily.    Historical Provider, MD  lisinopril (PRINIVIL,ZESTRIL) 20 MG tablet Take 1 tablet (20 mg total) by mouth 2 (two) times daily. 04/27/15   Donzetta Starch, NP  Melatonin 3 MG TABS Take 6 mg by mouth at bedtime.    Historical Provider, MD  Multiple Vitamins-Minerals (MULTIVITAMINS THER. W/MINERALS) TABS tablet Take 1 tablet by mouth daily.    Historical Provider, MD  nicotine (NICODERM CQ - DOSED IN MG/24 HR) 7 mg/24hr patch Place 7 mg onto the skin daily.    Historical Provider, MD  nitroGLYCERIN (NITROSTAT) 0.4 MG SL tablet Place 0.4 mg under the tongue every 5 (five) minutes as needed for chest pain.    Historical Provider, MD  Nutritional Supplements (FEEDING SUPPLEMENT, VITAL AF 1.2 CAL,) LIQD Place 1,000 mLs into feeding tube continuous. 04/27/15   Donzetta Starch, NP  ondansetron (ZOFRAN) 4 MG tablet Take 4 mg by mouth every 6 (six) hours as needed for nausea or vomiting.    Historical Provider, MD  ondansetron (ZOFRAN) 4 MG/2ML SOLN injection Inject 4 mg into the vein every 6 (six) hours as needed for nausea or vomiting.    Historical Provider, MD  pantoprazole sodium (PROTONIX) 40 mg/20 mL PACK Place 20 mLs (40 mg total) into feeding tube daily. 04/27/15   Donzetta Starch, NP  potassium chloride 20 MEQ/15ML (10%) SOLN  Place 15 mLs (20 mEq total) into feeding tube 2 (two)  times daily. 04/27/15   Donzetta Starch, NP  pravastatin (PRAVACHOL) 40 MG tablet Take 40 mg by mouth daily.    Historical Provider, MD  senna-docusate (SENNA PLUS) 8.6-50 MG tablet Take 1 tablet by mouth 2 (two) times daily as needed for mild constipation.    Historical Provider, MD  sennosides (SENOKOT) 8.8 MG/5ML syrup Place 5 mLs into feeding tube 2 (two) times daily as needed for mild constipation. 04/27/15   Donzetta Starch, NP  sodium chloride (OCEAN) 0.65 % SOLN nasal spray Place 1 spray into both nostrils 3 (three) times daily. To help with nose bleeds 06/06/15   Ivan Anchors Love, PA-C  sodium chloride 0.9 % infusion Inject 1,000 mLs into the vein continuous. 04/27/15   Donzetta Starch, NP  sodium chloride 0.9 % injection 10-40 mLs by Intracatheter route every 12 (twelve) hours. 04/27/15   Donzetta Starch, NP  sodium chloride 0.9 % injection 10-40 mLs by Intracatheter route as needed (flush). 04/27/15   Donzetta Starch, NP  thiamine 100 MG tablet Take 100 mg by mouth daily.    Historical Provider, MD  vancomycin (VANCOCIN) 1 GM/200ML SOLN Inject 200 mLs (1,000 mg total) into the vein every 8 (eight) hours. Patient not taking: Reported on 05/16/2015 04/28/15   Donzetta Starch, NP  zinc sulfate 220 MG capsule Take 220 mg by mouth daily at 6 PM.    Historical Provider, MD    Physical Exam: There were no vitals filed for this visit. General: Not in acute distress HEENT:       Eyes: PERRL, EOMI, no scleral icterus.       ENT: No discharge from the ears and nose, no pharynx injection, no tonsillar enlargement.        Neck: No JVD, no bruit, no mass felt. Heme: No neck lymph node enlargement. Cardiac: S1/S2, RRR, tachycardia, No murmurs, No gallops or rubs. Pulm:  No rales, wheezing, rhonchi or rubs. Abd: Soft, nondistended, nontender, no rebound pain, no organomegaly, BS present. Ext: No pitting leg edema bilaterally. 2+DP/PT pulse  bilaterally. Musculoskeletal: No joint deformities, No joint redness or warmth, no limitation of ROM in spin. Skin: No rashes.  Neuro: Alert, cranial nerves II-XII grossly intact except for right facial droop, right-sided hemiparalysis. Marland KitchenPsych: Patient is not psychotic, no suicidal or hemocidal ideation.  Labs on Admission:  Basic Metabolic Panel:  Recent Labs Lab 06/04/15 1630 06/05/15 0335 06/06/15 1928  NA 139 138 138  K 3.3* 3.5 3.1*  CL 100* 99* 97*  CO2 29 29 29   GLUCOSE 111* 88 171*  BUN 12 8 18   CREATININE 0.54 0.46 0.53  CALCIUM 9.6 9.3 9.3   Liver Function Tests:  Recent Labs Lab 06/06/15 1928  AST 20  ALT 25  ALKPHOS 74  BILITOT 0.4  PROT 7.0  ALBUMIN 3.2*   No results for input(s): LIPASE, AMYLASE in the last 168 hours. No results for input(s): AMMONIA in the last 168 hours. CBC:  Recent Labs Lab 06/04/15 1554 06/04/15 2139 06/05/15 0335 06/06/15 1928  WBC 8.7 8.9 7.6 15.4*  NEUTROABS  --   --   --  10.5*  HGB 11.7* 11.5* 11.7* 13.6  HCT 37.1 36.0 37.0 42.4  MCV 87.3 87.4 87.9 86.9  PLT 242 238 251 354   Cardiac Enzymes:  Recent Labs Lab 06/06/15 1928 06/06/15 2256 06/07/15 0443  TROPONINI 0.46* 0.33* 0.22*    BNP (last 3 results) No results for input(s): BNP in the last 8760  hours.  ProBNP (last 3 results) No results for input(s): PROBNP in the last 8760 hours.  CBG:  Recent Labs Lab 06/06/15 0442 06/06/15 0653 06/06/15 1204 06/06/15 1649 06/06/15 2140  GLUCAP 148* 156* 182* 175* 118*    Radiological Exams on Admission: Dg Chest 2 View  06/05/2015  CLINICAL DATA:  Shortness of breath with exertion, personal history of breast cancer, patient smokes EXAM: CHEST  2 VIEW COMPARISON:  05/14/2015 FINDINGS: Stable mild cardiac enlargement. Vascular pattern normal. No consolidation or effusion. IMPRESSION: No active cardiopulmonary disease. Electronically Signed   By: Skipper Cliche M.D.   On: 06/05/2015 10:39   Ct Angio Chest  Pe W/cm &/or Wo Cm  06/07/2015  CLINICAL DATA:  Tachycardia. EKG with sinus tachycardia and T-wave abnormality. EXAM: CT ANGIOGRAPHY CHEST WITH CONTRAST TECHNIQUE: Multidetector CT imaging of the chest was performed using the standard protocol during bolus administration of intravenous contrast. Multiplanar CT image reconstructions and MIPs were obtained to evaluate the vascular anatomy. CONTRAST:  80 mL OMNIPAQUE IOHEXOL 350 MG/ML SOLN COMPARISON:  04/20/2015 FINDINGS: Technically adequate study with good opacification of the central and segmental pulmonary arteries. Filling defects are demonstrated in the right lower lobe posterior subsegmental branch vessels consistent with pulmonary embolus. This may be acute or chronic. No large central pulmonary emboli. Possible filling defect in the left atrial appendage. Cardiac enlargement with evidence of left ventricular hypertrophy. R 8 RV ratio is less than 1 suggesting no evidence of right heart strain. Small pericardial effusion or thickening. Esophagus is decompressed. No significant lymphadenopathy in the chest. Evaluation of lungs is limited due to respiratory motion artifact. Probable atelectasis in the lung bases. Emphysematous changes in the upper lungs. No pleural effusions. No pneumothorax. Included portions of the upper abdominal organs demonstrate calcified granulomas in the spleen. Degenerative changes in the spine. Review of the MIP images confirms the above findings. IMPRESSION: Subsegmental right lower lobe pulmonary emboli. No evidence of right heart strain. Possible filling defect in the left atrial appendage. Cardiac enlargement with left ventricular hypertrophy. No evidence of active pulmonary disease. These results were called by telephone at the time of interpretation on 06/07/2015 at 12:44 am to [facility], the patient's nurse on [facility]-4[facility], who verbally acknowledged these results. Electronically Signed   By: Lucienne Capers M.D.   On: 06/07/2015 00:47    Dg Swallowing Func-speech Pathology  06/06/2015  Objective Swallowing Evaluation:   Patient Details Name: HADASHA ARTLEY MRN: [id number] Date of Birth: May 17, 1956 Today's Date: 06/06/2015 Time: SLP Start Time (ACUTE ONLY): 0902-SLP Stop Time (ACUTE ONLY): 0930 SLP Time Calculation (min) (ACUTE ONLY): 28 min Past Medical History: Past Medical History Diagnosis Date . Hypertension  . Coronary artery disease  . Diabetes mellitus  . Breast cancer ([place]) 03/10/2007   Left breat . Degenerative disc disease, cervical  . Degenerative disc disease, lumbar  . Fibromyalgia  . Hyperlipidemia  . Carpal tunnel syndrome on right  Past Surgical History: Past Surgical History Procedure Laterality Date . Cardiac surgery   . Appendectomy   . Breast surgery   . Mastectomy Left 03/2007 . Cardiac catheterization  2005 . Vein surgery Right 2005 . Tubal ligation Right  . Radiology with anesthesia N/A 04/11/2015   Procedure: RADIOLOGY WITH ANESTHESIA;  Surgeon: Luanne Bras, MD;  Location: [place];  Service: Radiology;  Laterality: N/A; . Cardiac catheterization N/A 04/13/2015   Procedure: Temporary Pacemaker;  Surgeon: Peter M Martinique, MD;  Location: [place] [facility];  Service: Cardiovascular;  Laterality: N/A; .  Peg placement N/A 04/18/2015   Procedure: PERCUTANEOUS ENDOSCOPIC GASTROSTOMY (PEG) PLACEMENT;  Surgeon: Judeth Horn, MD;  Location: [place];  Service: General;  Laterality: N/A;  bedside . Esophagogastroduodenoscopy N/A 04/18/2015   Procedure: ESOPHAGOGASTRODUODENOSCOPY (EGD);  Surgeon: Judeth Horn, MD;  Location: [facility] ENDOS[facility];  Service: General;  Laterality: N/A; HPI: Kerry [name] is a 59 y.o. female who was admitted on 04/11/15 with difficulty speaking, right facial and right sided weakness. CT showed dense L-MCA sign and TPA started but she had worsening of right hemiparesis with lethargy. Post procedure developed acute respiratory failure with dense right hemiparesis with global aphasia. She was vent  dependent and developed small patchy areas of hemorrhage within acute Large L-MCA infarct as well as cerebral edema. She continued to have lethargy with decrease in LOC requiring PEG placement by Dr. Hulen Skains and Lurline Idol with BAL performed by Dr Nelda Marseille.  She was weaned to trach collar but continued to have significant secretions requiring frequent suctioning.  She was [facility] to [facility] on 11/[facility] for medical management and rehab. She was decannulated on 11/21.  MBS done 11/14 and she was started on dysphagia 1, honey liquids on 11/21 and tube feeds ongoing as supplement. She was felt to be a good candidate for intensive rehab program and CIR was recommended for follow up therapy.  Repeat objective swallow study ordered today to determine readiness for diet progression.   No Data Recorded Assessment / Plan / Recommendation CHL IP CLINICAL IMPRESSIONS 06/06/2015 Therapy Diagnosis Mild oral phase dysphagia;Moderate pharyngeal phase dysphagia Clinical Impression Pt presents with improvements in swallowing function in comparison to initial MBS.  Pt now presents with a mild oral phase dysphagia and moderate pharyngeal phase dysphagia with both sensory and motor components.  Oral phase deficits are characterized by right sided labial and lingual weakness which results in weakened manipulation and prolonged oral transit of boluses to the oropharynx.  Decreased pharyngeal sensation and decreased base of tongue retraction also resulted in premature spillage of materials into the pharynx and delayed swallow initiation.  Swallow response was delayed to the vallecula with thickened liquids and solids, and further delayed to the pyriforms with thin liquids.  Delayed swallow initiation resulted in deep, silent penetration of thin liquids.    Pharyngeal weakness also resulted in mild-moderate residuals remaining in the vallecula post swallow.  Vallecular residuals were noted to spill over the epiglottis into the laryngeal vestibule and  resulted in high flash penetration of solids during the swallow as well as trace aspiration after the swallow during consumption of thin liquids.   Pt was noted to spontaneously use extra swallows which, in combination with SLP interventions for alternating solids and liquids effectively minimized the amount of residue in the pharynx.  Recommend diet progression to dys 2 textures and nectar thick liquids with full supervision for use of swallowing precautions.  Pt would also benefit from the water protocol to continue working towards further liquids progression.  Prognosis for advancement good with trials of advanced consistencies, SLP follow up for use of safe swallowing strategies, and repeat objective swallow study in 1-2 weeks.   Impact on safety and function Risk for inadequate nutrition/hydration;Moderate aspiration risk   No flowsheet data found.  CHL IP DIET RECOMMENDATION 06/06/2015 SLP Diet Recommendations Dysphagia 2 (Fine chop) solids;Nectar thick liquid Liquid Administration via Cup Medication Administration Other (Comment) Compensations Minimize environmental distractions;Slow rate;Small sips/bites;Multiple dry swallows after each bite/sip;Follow solids with liquid Postural Changes --   No flowsheet data found.  CHL IP ORAL PHASE 06/06/2015 Oral Phase Impaired Oral - Pudding Teaspoon -- Oral - Pudding Cup -- Oral - Honey Teaspoon -- Oral - Honey Cup Lingual/palatal residue;Decreased bolus cohesion;Weak lingual manipulation;Reduced posterior propulsion;Delayed oral transit;Premature spillage Oral - Nectar Teaspoon -- Oral - Nectar Cup Weak lingual manipulation;Reduced posterior propulsion;Premature spillage;Delayed oral transit;Decreased bolus cohesion;Lingual/palatal residue Oral - Nectar Straw -- Oral - Thin Teaspoon -- Oral - Thin Cup Weak lingual manipulation;Reduced posterior propulsion;Premature spillage;Delayed oral transit;Decreased bolus cohesion;Lingual/palatal residue Oral - Thin Straw  Premature spillage;Lingual/palatal residue;Decreased bolus cohesion Oral - Puree Weak lingual manipulation;Reduced posterior propulsion;Lingual/palatal residue;Premature spillage;Delayed oral transit Oral - Mech Soft -- Oral - Regular Weak lingual manipulation;Reduced posterior propulsion;Lingual/palatal residue;Premature spillage Oral - Multi-Consistency -- Oral - Pill -- Oral Phase - Comment --  CHL IP PHARYNGEAL PHASE 06/06/2015 Pharyngeal Phase Impaired Pharyngeal- Pudding Teaspoon -- Pharyngeal -- Pharyngeal- Pudding Cup -- Pharyngeal -- Pharyngeal- Honey Teaspoon -- Pharyngeal -- Pharyngeal- Honey Cup Delayed swallow initiation-vallecula;Reduced tongue base retraction;Reduced laryngeal elevation;Reduced anterior laryngeal mobility;Pharyngeal residue - valleculae Pharyngeal -- Pharyngeal- Nectar Teaspoon -- Pharyngeal -- Pharyngeal- Nectar Cup Delayed swallow initiation-vallecula;Reduced tongue base retraction;Reduced anterior laryngeal mobility;Reduced laryngeal elevation;Pharyngeal residue - valleculae Pharyngeal -- Pharyngeal- Nectar Straw -- Pharyngeal -- Pharyngeal- Thin Teaspoon -- Pharyngeal -- Pharyngeal- Thin Cup Reduced airway/laryngeal closure;Reduced tongue base retraction;Reduced anterior laryngeal mobility;Reduced laryngeal elevation;Pharyngeal residue - valleculae;Pharyngeal residue - pyriform;Penetration/Aspiration during swallow;Penetration/Apiration after swallow;Delayed swallow initiation-pyriform sinuses Pharyngeal Material enters airway, CONTACTS cords and not ejected out;Material enters airway, passes BELOW cords without attempt by patient to eject out (silent aspiration) Pharyngeal- Thin Straw Compensatory strategies attempted (with notebox);Penetration/Aspiration during swallow;Reduced airway/laryngeal closure;Reduced anterior laryngeal mobility;Reduced laryngeal elevation;Reduced tongue base retraction;Pharyngeal residue - pyriform;Pharyngeal residue - valleculae Pharyngeal Material  enters airway, CONTACTS cords and then ejected out Pharyngeal- Puree Delayed swallow initiation-vallecula;Reduced tongue base retraction;Pharyngeal residue - valleculae;Reduced anterior laryngeal mobility Pharyngeal -- Pharyngeal- Mechanical Soft -- Pharyngeal -- Pharyngeal- Regular Delayed swallow initiation-vallecula;Reduced anterior laryngeal mobility;Reduced tongue base retraction;Pharyngeal residue - valleculae Pharyngeal -- Pharyngeal- Multi-consistency -- Pharyngeal -- Pharyngeal- Pill -- Pharyngeal -- Pharyngeal Comment --  No flowsheet data found. Page, Selinda Orion 06/06/2015, 12:37 PM               EKG: Independently reviewed.  QTC 480, tachycardia, T wave inversion in V3 to V6.   Assessment/Plan Principal Problem:   PE (pulmonary embolism) Active Problems:   CVA (cerebral infarction)   Aphasia   Flaccid hemiplegia and hemiparesis (HCC)   Encounter for central line placement   Cerebral hemorrhage (HCC)   HLD (hyperlipidemia)   Type 2 diabetes mellitus with circulatory disorder (HCC)   Hypokalemia   Essential hypertension   Coronary artery disease involving native coronary artery of native heart with angina pectoris (HCC)   History of breast cancer   Elevated troponin   UTI (urinary tract infection)   Coffee ground emesis   Sepsis ([place])  PE (pulmonary embolism): as evidenced by CTA, no R heart straining. Patient has hypotension which is likely from sepsis rather than PE. PCCM consulted and recommended to start IV heparin  -admit to stepdown for close monitoring -heparin drip initiated -2D echocardiogram ordered -LE dopplers ordered to evaluate for DVT -trop x 3 -pain control: When necessary morphine  CVA (cerebral infarction): s/p of tPA and embolectomy by IR. Has right-sided hemiparesis and aphagia. S/p of PEG placement. -On nutrition supplement and free water per tube -On Plavix  HLD: Last LDL was 133 on 04/12/15  -Continue Lipitor  DM-II: Last A1c 9.7  on 04/28/15,  poorly controled. Patient was taking Lantus before, currently on sliding scale insulin. -SSI  Hypokalemia: Potassium 3.1 -Potassium chloride 20 mEq twice a day per tube  Essential hypertension: Not has hypotension, which improved to 124/57 at 1.5 L of normal saline bolus -Hold all blood pressure medications now -Continue IV fluid, normal saline 100 cc/h  CAD and elevated troponin: Troponin is mildly elevated at 0.33, which is likely due to demanding ischemia secondary to pulmonary embolism. -Troponin 3 -Follow-up 2-D echo -Lipitor, Plavix, when necessary morphine and nitroglycerin  UTI and sepsis: Patient is being treated for UTI with Ancef. Urine culture showed pan sensitive E.coli). She meets criteria for sepsis with leukocytosis, fever, tachycardia, tachypnea and hypotension. Lactate 1.1. -continue Ancef IV -will get Procalcitonin and trend lactic acid levels per sepsis protocol. -IVF: 1.5L of NS bolus in ED, followed by 100  Coffee ground emesis: GI was consulted, Dr. Michail Sermon invited the patient, who suspected the epistaxis as the source. EGD not needed at this time. Ok to resume Plavix on 12/14. -IV protonix  -check cbc q6h while on IV heparin.  DVT ppx: On IV Heparin     Code Status: Full code Family Communication: None at bed side. Disposition Plan: Admit to inpatient   Date of Service 06/07/2015    Ivor Costa [facility] Pager [phone]  If 7PM-7AM, please contact night-coverage [email or web address] Password [facility] 06/07/2015, 6:52 AM

## 2015-06-07 NOTE — Progress Notes (Signed)
[facility] Progress Note Patient Name: Kerry [name] DOB: Jul 29, 1955 MRN: [id number]   Date of Service  06/07/2015  HPI/Events of Note  D/  w D[facility] Lorin [facility]et neuro - based on infor I provided of stroke in Oct 2016, and currently on plavix ok to anti-coagulate with heparin for PE but no systemic lysis  eICU Interventions  xx     Intervention Category Intermediate Interventions: Communication with other healthcare providers and/or family  Terion Hedman 06/07/2015, 2:38 AM

## 2015-06-07 NOTE — Progress Notes (Signed)
Social Work Discharge Note  The overall goal for the admission was met for:   Discharge location: No-TRANSFERRING TO ACUTE MEDICAL ISSUES  Length of Stay: No-22 DAYS  Discharge activity level: No-MIN/MOD LEVEL  Home/community participation: Yes  Services provided included: MD, RD, PT, OT, SLP, RN, CM, TR, Pharmacy and [place]: [facility]  Follow-up services arranged: Home Health: [facility]-PT, OT, SP, RN,AIDE  Comments (or additional information):PT BEING TRANSFERRED TO STEP DOWN UNIT DUE TO MEDICAL ISSUES-FAMILY EDUCATION ON-GOING REGARDING [employer] DISCHARGE DATE WAS 12/17. FAMILY AWARE OF AMOUNT OF CARE PT REQUIRES AND COMMITTED TO TAKING HER HOME UPON DISCHARGE. HAVE CONTACTED [place]   Patient/Family verbalized understanding of follow-up arrangements: Yes  Individual responsible for coordination of the follow-up plan: THOMAS-HUSBAND  Confirmed correct DME delivered: Elease Hashimoto 06/07/2015   Elease Hashimoto

## 2015-06-07 NOTE — Progress Notes (Signed)
Report was given,pt. Is ready to be transfer to 2H15.

## 2015-06-07 NOTE — Progress Notes (Signed)
Physical Therapy Note  Patient Details  Name: Kerry [name] MRN: [id number] Date of Birth: [birth year] Today's Date: 06/07/2015    Pt being transferred off the unit due to medical complications. Pt will be discharged from CIR PT services at this time. Pt did not complete program and therefore did not meet all of her long term goals.   Canary Brim Ivory Broad, PT, DPT  06/07/2015, 8:10 AM

## 2015-06-08 ENCOUNTER — Inpatient Hospital Stay (HOSPITAL_COMMUNITY): Payer: 59

## 2015-06-08 DIAGNOSIS — I63032 Cerebral infarction due to thrombosis of left carotid artery: Secondary | ICD-10-CM

## 2015-06-08 DIAGNOSIS — G8100 Flaccid hemiplegia affecting unspecified side: Secondary | ICD-10-CM

## 2015-06-08 DIAGNOSIS — A419 Sepsis, unspecified organism: Principal | ICD-10-CM

## 2015-06-08 DIAGNOSIS — E1159 Type 2 diabetes mellitus with other circulatory complications: Secondary | ICD-10-CM

## 2015-06-08 LAB — COMPREHENSIVE METABOLIC PANEL
ALT: 18 U/L (ref 14–54)
AST: 20 U/L (ref 15–41)
Albumin: 2.8 g/dL — ABNORMAL LOW (ref 3.5–5.0)
Alkaline Phosphatase: 66 U/L (ref 38–126)
Anion gap: 8 (ref 5–15)
BILIRUBIN TOTAL: 0.5 mg/dL (ref 0.3–1.2)
BUN: 6 mg/dL (ref 6–20)
CO2: 25 mmol/L (ref 22–32)
CREATININE: 0.44 mg/dL (ref 0.44–1.00)
Calcium: 8.4 mg/dL — ABNORMAL LOW (ref 8.9–10.3)
Chloride: 106 mmol/L (ref 101–111)
Glucose, Bld: 117 mg/dL — ABNORMAL HIGH (ref 65–99)
POTASSIUM: 3.8 mmol/L (ref 3.5–5.1)
Sodium: 139 mmol/L (ref 135–145)
TOTAL PROTEIN: 5.7 g/dL — AB (ref 6.5–8.1)

## 2015-06-08 LAB — GLUCOSE, CAPILLARY
GLUCOSE-CAPILLARY: 116 mg/dL — AB (ref 65–99)
GLUCOSE-CAPILLARY: 161 mg/dL — AB (ref 65–99)
Glucose-Capillary: 145 mg/dL — ABNORMAL HIGH (ref 65–99)
Glucose-Capillary: 175 mg/dL — ABNORMAL HIGH (ref 65–99)

## 2015-06-08 LAB — CBC
HCT: 35.9 % — ABNORMAL LOW (ref 36.0–46.0)
HCT: 37.3 % (ref 36.0–46.0)
Hemoglobin: 11.6 g/dL — ABNORMAL LOW (ref 12.0–15.0)
Hemoglobin: 12.2 g/dL (ref 12.0–15.0)
MCH: 28.5 pg (ref 26.0–34.0)
MCH: 28.4 pg (ref 26.0–34.0)
MCHC: 32.3 g/dL (ref 30.0–36.0)
MCHC: 32.7 g/dL (ref 30.0–36.0)
MCV: 88.2 fL (ref 78.0–100.0)
MCV: 86.9 fL (ref 78.0–100.0)
Platelets: 229 10*3/uL (ref 150–400)
Platelets: 257 10*3/uL (ref 150–400)
RBC: 4.07 MIL/uL (ref 3.87–5.11)
RBC: 4.29 MIL/uL (ref 3.87–5.11)
RDW: 14.5 % (ref 11.5–15.5)
RDW: 14.6 % (ref 11.5–15.5)
WBC: 11.7 10*3/uL — ABNORMAL HIGH (ref 4.0–10.5)
WBC: 13.6 10*3/uL — ABNORMAL HIGH (ref 4.0–10.5)

## 2015-06-08 LAB — POCT I-STAT 3, ART BLOOD GAS (G3+)
Acid-Base Excess: 3 mmol/L — ABNORMAL HIGH (ref 0.0–2.0)
BICARBONATE: 30.1 meq/L — AB (ref 20.0–24.0)
O2 Saturation: 96 %
PH ART: 7.35 (ref 7.350–7.450)
PO2 ART: 90 mmHg (ref 80.0–100.0)
TCO2: 32 mmol/L (ref 0–100)
pCO2 arterial: 54.5 mmHg — ABNORMAL HIGH (ref 35.0–45.0)

## 2015-06-08 LAB — MRSA PCR SCREENING: MRSA by PCR: NEGATIVE

## 2015-06-08 LAB — HEPARIN LEVEL (UNFRACTIONATED): HEPARIN UNFRACTIONATED: 0.33 [IU]/mL (ref 0.30–0.70)

## 2015-06-08 MED ORDER — CARVEDILOL 12.5 MG PO TABS
12.5000 mg | ORAL_TABLET | Freq: Two times a day (BID) | ORAL | Status: DC
  Administered 2015-06-08 – 2015-06-13 (×11): 12.5 mg via ORAL
  Filled 2015-06-08 (×12): qty 1

## 2015-06-08 MED ORDER — OXYMETAZOLINE HCL 0.05 % NA SOLN
2.0000 | Freq: Three times a day (TID) | NASAL | Status: AC
  Administered 2015-06-08 – 2015-06-10 (×8): 2 via NASAL
  Filled 2015-06-08 (×2): qty 15

## 2015-06-08 MED ORDER — CLOPIDOGREL BISULFATE 75 MG PO TABS
75.0000 mg | ORAL_TABLET | Freq: Every day | ORAL | Status: DC
  Administered 2015-06-08: 75 mg via ORAL

## 2015-06-08 MED ORDER — HYDRALAZINE HCL 20 MG/ML IJ SOLN
INTRAMUSCULAR | Status: AC
  Filled 2015-06-08: qty 1

## 2015-06-08 MED ORDER — LORAZEPAM 2 MG/ML IJ SOLN
0.5000 mg | Freq: Once | INTRAMUSCULAR | Status: AC
  Administered 2015-06-08: 0.5 mg via INTRAVENOUS
  Filled 2015-06-08: qty 1

## 2015-06-08 MED ORDER — HYDRALAZINE HCL 20 MG/ML IJ SOLN
10.0000 mg | Freq: Four times a day (QID) | INTRAMUSCULAR | Status: DC | PRN
  Administered 2015-06-08 – 2015-06-09 (×2): 10 mg via INTRAVENOUS
  Filled 2015-06-08: qty 1

## 2015-06-08 MED ORDER — LISINOPRIL 10 MG PO TABS
10.0000 mg | ORAL_TABLET | Freq: Every day | ORAL | Status: DC
  Administered 2015-06-08 – 2015-06-13 (×6): 10 mg via ORAL
  Filled 2015-06-08 (×7): qty 1

## 2015-06-08 MED ORDER — RIVAROXABAN 20 MG PO TABS: 20.0000 mg | ORAL_TABLET | Freq: Every day | ORAL | Status: DC

## 2015-06-08 MED ORDER — BACITRACIN ZINC 500 UNIT/GM EX OINT
TOPICAL_OINTMENT | Freq: Three times a day (TID) | CUTANEOUS | Status: DC
  Administered 2015-06-08 – 2015-06-13 (×16): via TOPICAL
  Filled 2015-06-08 (×2): qty 28.35

## 2015-06-08 MED ORDER — RIVAROXABAN 15 MG PO TABS
15.0000 mg | ORAL_TABLET | Freq: Two times a day (BID) | ORAL | Status: DC
  Administered 2015-06-08 – 2015-06-13 (×11): 15 mg via ORAL
  Filled 2015-06-08 (×13): qty 1

## 2015-06-08 NOTE — Progress Notes (Signed)
Dr. Ree Kida notified pt's BP still running high 160s-180s.  Orders received.  Dr. Ree Kida also notified pt's husband was in room and called in and spoke with pt's husband

## 2015-06-08 NOTE — Progress Notes (Signed)
Have asked cm sec to check on copay and if prior auth req for xarelto or eliquis.

## 2015-06-08 NOTE — Consult Note (Addendum)
Kerry [name], Kerry [name] [id number] Jul 19, 1955 Kerry Ford, DO  Reason for Consult: stridor, possible vocal cord paralysis  HPI: 59yo with recent stroke and right hemiparesis with more recent right lower lobe pulmonary embolus. History of tracheotomy several weeks ago since decannulated. Primary team noted some stridor and consulted ENT for laryngoscopy.  Allergies:  Allergies  Allergen Reactions  . Codeine     Nausea?-- "made her sick"  . Oxycodone Nausea Only    Sick   . Oxycontin [Oxycodone Hcl] Nausea Only    Sick     ROS: unable to obtain x 12 systems  due to decreased mental status and aphasia.  PMH:  Past Medical History  Diagnosis Date  . Hypertension   . Coronary artery disease   . Diabetes mellitus   . Breast cancer ([place]) 03/10/2007    Left breat  . Degenerative disc disease, cervical   . Degenerative disc disease, lumbar   . Fibromyalgia   . Hyperlipidemia   . Carpal tunnel syndrome on right   . PE (pulmonary embolism)   . UTI (lower urinary tract infection)   . GIB (gastrointestinal bleeding)   . Elevated troponin     FH:  Family History  Problem Relation Age of Onset  . Cancer Mother     Breast cancer (right)  . Diabetes Sister   . Cancer Maternal Aunt     Breast cancer  . Cancer Maternal Aunt     Breast cancer  . Cancer Cousin     Breast Cancer    SH:  Social History   Social History  . Marital Status: Married    Spouse Name: N/A  . Number of Children: N/A  . Years of Education: N/A   Occupational History  . Not on file.   Social History Main Topics  . Smoking status: Current Every Day Smoker -- 0.25 packs/day  . Smokeless tobacco: Never Used  . Alcohol Use: Yes     Comment: Social drinker  . Drug Use: No     Comment: Tried crack cocaine and marijuana in the past  . Sexual Activity: No   Other Topics Concern  . Not on file   Social History Narrative    PSH:  Past Surgical History  Procedure Laterality Date  . Cardiac  surgery    . Appendectomy    . Breast surgery    . Mastectomy Left 03/2007  . Cardiac catheterization  2005  . Vein surgery Right 2005  . Tubal ligation Right   . Radiology with anesthesia N/A 04/11/2015    Procedure: RADIOLOGY WITH ANESTHESIA;  Surgeon: Luanne Bras, MD;  Location: [facility];  Service: Radiology;  Laterality: N/A;  . Cardiac catheterization N/A 04/13/2015    Procedure: Temporary Pacemaker;  Surgeon: Peter M Martinique, MD;  Location: [facility];  Service: Cardiovascular;  Laterality: N/A;  . Peg placement N/A 04/18/2015    Procedure: PERCUTANEOUS ENDOSCOPIC GASTROSTOMY (PEG) PLACEMENT;  Surgeon: Judeth Horn, MD;  Location: [facility];  Service: General;  Laterality: N/A;  bedside  . Esophagogastroduodenoscopy N/A 04/18/2015    Procedure: ESOPHAGOGASTRODUODENOSCOPY (EGD);  Surgeon: Judeth Horn, MD;  Location: [facility];  Service: General;  Laterality: N/A;    Physical  Exam: aphasic, lying in ICU bed EAC/TMs normal BL. Oral cavity, lips, gums, ororpharynx normal with no masses or lesions. Some mild audible inspiratory and expiratory stridor but no respiratory distress. Skin warm and dry. Nasal cavity with dried blood right naris but without polyps or purulence. External nose  and ears without masses or lesions. PERRLA. Neck supple. No lymphadenopathy palpated. Thyroid symmetric with no masses palpated.  Procedure Note: 31575 Informed verbal consent was obtained after explaining the risks (including bleeding and infection), benefits and alternatives of the procedure. Verbal timeout was performed prior to the procedure. The nose was topicalized with topical oxymetazoline and surgilube. The 33mm flexible scope was advanced through the right nasal cavity. The septum and turbinates appeared normal. The middle meatus was free of polyps or purulence. The eustachian tube, choana, and adenoids were normal in appearance. The hypopharynx, arytenoids and  false vocal folds appeared  normal with no masses or lesions. There are no masses or lesions of the true vocal folds but there is bilateral true vocal fold paresis/hypomobility with just some mild motion of the arytenoids. The laryngeal aperture is narrowed somewhat by the vocal fold paresis but is patent and the posterior glottis is patent. The visualized portion of the subglottis appeared normal. The patient tolerated the procedure with no immediate complications.  A/P: bilateral vocal fold paresis/hypomobility. This may be a sequela of her recent CVA. Since she does not appear to be in any respiratory distress from this and is able to lie flat with no difficulty, and she passed her swallow eval, can likely just observe with oxygen as needed. She should be intubatable if needed. Can also see bilateral vocal fold paralysis from brain edema and cerebellar tonsillar herniation as in Chiari malformation so MRI or CT head may be indicated. Would humidify the nasal cannula oxygen to prevent epistaxis. Once she is more stable can consider outpatient referral to one of the Laryngologists at one of the the Universities to consider laser arytenoidectomy/partial cordotomy which would give her a larger laryngeal airway if her stridor is persistent once she improves from her recent issues.   [name] 06/08/2015 5:18 PM

## 2015-06-08 NOTE — Progress Notes (Signed)
Subjective/Objective Patient with deteriorating mental status (GCS earlier today 12 - now 7)  Physical exam: BP 105/61 mmHg  Pulse 90  Temp(Src) 98.3 F (36.8 C) (Axillary)  Resp 31  Wt 80.1 kg (176 lb 9.4 oz)  SpO2 100% General: Eyes closed, unresponsive Lungs: sonorous labored breathing Heart:  Regular rate and rhythm Neuro:  GCS 7, PERRL. Does not follow commands or open eyes.   Scheduled Meds: . antiseptic oral rinse  7 mL Mouth Rinse q12n4p  . atorvastatin  40 mg Oral q1800  . bacitracin   Topical TID  . carvedilol  12.5 mg Oral BID WC  .  ceFAZolin (ANCEF) IV  1 g Intravenous 3 times per day  . chlorhexidine  15 mL Mouth Rinse BID  . feeding supplement (PRO-STAT SUGAR FREE 64)  30 mL Oral Daily  . feeding supplement (VITAL AF 1.2 CAL)  237 mL Per Tube TID  . folic acid  1 mg Oral Daily  . free water  200 mL Per Tube 3 times per day  . lisinopril  10 mg Oral Daily  . multivitamin with minerals  1 tablet Oral Daily  . nicotine  7 mg Transdermal Daily  . oxymetazoline  2 spray Each Nare 3 times per day  . potassium chloride  20 mEq Per Tube BID  . rivaroxaban  15 mg Oral BID WC   Followed by  . [START ON 06/30/2015] rivaroxaban  20 mg Oral Q supper  . sodium chloride  3 mL Intravenous Q12H  . thiamine  100 mg Oral Daily   Continuous Infusions:  PRN Meds:acetaminophen, bisacodyl, hydrALAZINE, ipratropium-albuterol, nitroGLYCERIN, senna-docusate  Vital signs in last 24 hours: Temp:  [97.8 F (36.6 C)-99.1 F (37.3 C)] 98.3 F (36.8 C) (12/16 2300) Pulse Rate:  [90-145] 90 (12/16 2300) Resp:  [23-43] 31 (12/16 2300) BP: (105-207)/(61-113) 105/61 mmHg (12/16 2300) SpO2:  [91 %-100 %] 100 % (12/16 2300) Weight:  [80.1 kg (176 lb 9.4 oz)] 80.1 kg (176 lb 9.4 oz) (12/16 0300)  Intake/Output last 3 shifts: I/O last 3 completed shifts: In: 5443.1 [I.V.:3445.1; [id number]; IV [id number] Out: 325 [Urine:325] Intake/Output this shift: Total I/O In: 487  [Other:237; NG/GT:200; IV Piggyback:50] Out: 200 [Urine:200]  Problem Assessment/Plan  Decline in LOC Evaluate CT head. CCM also evaluating.  Recent PE Appears respiratory status overall stable.  Arterial Blood Gas result:  pO2 90; pCO2 54.5; pH 7.35;  HCO3 30.1, %O2 Sat 96%. On Xarelto.

## 2015-06-08 NOTE — Progress Notes (Signed)
Initial Nutrition Assessment  DOCUMENTATION CODES:   Not applicable  INTERVENTION:  - Continue bolus feeds of 237 ml Vital AF 1.2 via PEG TID  30 ml Prostat daily.    Tube feeding regimen provides 952 kcal (53% of needs), 68 grams of protein, and 576 ml of H2O (1176 ml fluid with free water flush regimen).   -RD will follow for diet advancement  NUTRITION DIAGNOSIS:   Inadequate oral intake related to inability to eat as evidenced by NPO status.  GOAL:   Patient will meet greater than or equal to 90% of their needs  MONITOR:   Labs, Weight trends, TF tolerance, Skin, I & O's  REASON FOR ASSESSMENT:   Low Braden    ASSESSMENT:   59 y.o. female Initially admitted to [facility] 10/19 with L MCA infarct. She had stenting and embolectomy by IR and was later discharged to [facility] before being admitted to [facility] 11/23. On 12/15, she had tachycardia, tachypnea, and hypotension and was found to have small RLL PE and Rt CFV DVT. PCCM called for recs.  Pt transferred from CIR to SDU due to pulmonary embolism. Initial plan was to dc from CIR on 06/09/15 prior to acute medical event.  Pt working with therapy at time of visit.   Spoke with CIR RD, who revealed that pt was just advanced to a dysphagia 1 diet with honey thick liquids, but was not eating well. She was on PRN bolus feeds (Jevity 1.2 formula via PEG at a volume of 237 ml (1 can) up to 3 times daily).  Spoke with RN, who confirms NPO status. Pt is currently receiving bolus feedings of 237 ml Vital AF 1.2 3 times daily and 30 ml Prostat daily via PEG. Also receiving 200 ml free water flush TID. RN reports good tolerance with regimen. Total regimen provides 952 kcals, 68 grams protein, and 576 ml fluid (1176 ml fluid with free water flushes).    Labs reviewed: CBGS: 116-188.   Diet Order:  Diet NPO time specified  Skin:  Wound (see comment) (MASD butocks)  Last BM:  06/08/15  Height:   Ht Readings from Last 1 Encounters:   05/16/15 5\' 10"  (1.778 m)    Weight:   Wt Readings from Last 1 Encounters:  06/08/15 176 lb 9.4 oz (80.1 kg)    Ideal Body Weight:  72.7 kg  BMI:  Body mass index is 25.34 kg/(m^2).  Estimated Nutritional Needs:   Kcal:  1800-2000  Protein:  80-95 grams  Fluid:  1.8.2.0 L  EDUCATION NEEDS:   No education needs identified at this time  Imogean Ciampa A. Jimmye Norman, RD, LDN, CDE Pager: [phone] After hours Pager: [phone]

## 2015-06-08 NOTE — Progress Notes (Signed)
OT Cancellation Note  Patient Details Name: Kerry [name] MRN: [id number] DOB: 12-10-1955   Cancelled Treatment:    Reason Eval/Treat Not Completed: Medical issues which prohibited therapy. HR 133. RR 36. BP 184/103. Not appropriate at this time. Nsg aware. Will try again when pt's VSS.   [place], OTR/L  [id number] 06/08/2015 06/08/2015, 3:00 PM

## 2015-06-08 NOTE — Progress Notes (Signed)
Pt has had stomach breathing since rehab per MD, ABG initially was done and orders were to monitor closely, Pt mental status has declined, paged [facility], orders to obtain ABG. Will continue to monitor closely.

## 2015-06-08 NOTE — Progress Notes (Signed)
[facility]                                                                              Patient Demographics  Kerry [name], is a 59 y.o. female, DOB - [birth year], [id number]  Admit date - 06/07/2015   Admitting Physician Ivor Costa, MD  Outpatient Primary MD for the patient is [name], MD  LOS - 1   No chief complaint on file.       HPI on 06/07/2015 by Dr. Ivor Costa Kerry [name] is a 59 y.o. female with PMH of hypertension, hyperlipidemia, diabetes mellitus, GERD, CAD, breast cancer (s/P left mastectomy), DDD, carpal tunnel syndrome, recent large left MCA territory infarct with resolving associated hemorrhage after tPA treatment, s/p of PEG placement, right-sided hemiparalysis, currently doing rehab in [place] since 11/23.   Today pt developed tachycardia and hypotension. Cardiology was consulted, Dr. [name] suspected that patient may have PE, which is confirmed by CTA tonight. PCCM was consulted and recommended to start IV heparin. We are asked to admit pt. Pt has aphagia, but can tell that she has shortness of breath and mild chest pain. She does not seem to have abdominal pain or diarrhea. She is currently being treated for UTI with ancef since 12/13. CTA showed subsegmental right lower lobe pulmonary emboli. No evidence of right heart strain. Possible filling defect in the left atrial appendage.  Patient was found to have hypotension with blood pressure 82/46, potassium 3.1, troponin 0.33, lactate 1.1, tachycardia, tachypnea, temperature 99.8, WBC 15.4. Patient is admitted to stepdown bed for further eval and treatment.   Assessment & Plan  Acute pulmonary embolism/DVT -As seen on CTA chest -Patient did have transient hypotension which was thought to be likely from sepsis rather than from pulmonary embolism -PCCM was consulted and appreciated, recommended heparin drip -Echocardiogram: EF 0000000, grade 1 diastolic dysfunction -Troponin currently  trending downward, was 0.46 currently 0.19 -Lower extremity Doppler: Right focal, acute nonocclusive DVT noted in CFV -Currently heparin drip -Spoke with neurology, Dr. [name], regarding [facility] as patient recently received tPA for CVA- states it is fine to start a new agent and Plavix can be discontinued.  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 Michail Sermon suspected the epistaxis was the cause. EGD was not indicated at that time. It was okay to resume Plavix on 1214 -Continue Protonix -Hemoglobin has remained stable on heparin -Continue to monitor  CVA -Status post TPA and embolectomy by interventional radiology -Patient was admitted to inpatient rehabilitation -Patient has right-sided paresis as well as aphasia -Status post PEG placement -Continue statin  Hyperlipidemia -Last LDL on 04/12/2015 was 133 -Continue statin  Essential hypertension -  Patient was hypotensive which improved with fluid bolus -Continue to monitor and hold antihypertensive medications  Hypokalemia -Resolved, Continue to monitor BMP and replace as needed  -Will obtain magnesium level  Code Status: Full  Family Communication: None at bedside. Husband via phone  Disposition Plan: Admitted  Time Spent in minutes 30  minutes  Procedures  Lower extremity Doppler  Consults  Cardiology [facility], Dr. [name], via phone  DVT Prophylaxis Heparin  Lab Results  Component Value Date   PLT 229 06/08/2015    Medications  Scheduled Meds: . antiseptic oral rinse  7 mL Mouth Rinse q12n4p  . atorvastatin  40 mg Oral q1800  .  ceFAZolin (ANCEF) IV  1 g Intravenous 3 times per day  . chlorhexidine  15 mL Mouth Rinse BID  . clopidogrel  75 mg Oral Daily  . feeding supplement (PRO-STAT SUGAR FREE 64)  30 mL Oral Daily  . feeding supplement (VITAL AF 1.2 CAL)  237 mL Per Tube TID  . folic acid  1 mg Oral Daily  . free water  200 mL Per Tube 3 times per day  . hydrALAZINE      . multivitamin with minerals  1 tablet Oral Daily  . nicotine  7 mg Transdermal Daily  . potassium chloride  20 mEq Per Tube BID  . sodium chloride  3 mL Intravenous Q12H  . thiamine  100 mg Oral Daily   Continuous Infusions: . sodium chloride 100 mL/hr at 06/08/15 0818  . heparin 1,200 Units/hr (06/08/15 0832)   PRN Meds:.acetaminophen, bisacodyl, hydrALAZINE, ipratropium-albuterol, nitroGLYCERIN, senna-docusate  Antibiotics    Anti-infectives    Start     Dose/Rate Route Frequency Ordered Stop   06/07/15 1400  ceFAZolin (ANCEF) IVPB 1 g/50 mL premix     1 g 100 mL/hr over 30 Minutes Intravenous 3 times per day 06/07/15 1258     06/07/15 0600  ceFAZolin (ANCEF) IVPB 1 g/50 mL premix  Status:  Discontinued     1 g 100 mL/hr over 30 Minutes Intravenous 3 times per day 06/07/15 0551 06/07/15 [id number]      Subjective:   [name] seen and examined today.  Patient has aphasia.  No issues overnight as reported by the RN.  Objective:   Filed Vitals:   06/08/15 0730 06/08/15 0800 06/08/15 1000 06/08/15 1100  BP: 177/87 168/84 185/87 174/85  Pulse: 104 99 98 96  Temp: 97.8 F (36.6 C)   98 F (36.7 C)  TempSrc: Oral   Oral  Resp: 28 25 26 25   Weight:      SpO2: 94% 100% 100% 99%    Wt Readings  from Last 3 Encounters:  06/08/15 80.1 kg (176 lb 9.4 oz)  06/06/15 79.107 kg (174 lb 6.4 oz)  05/16/15 86.183 kg (190 lb)     Intake/Output Summary (Last 24 hours) at 06/08/15 1138 Last data filed at 06/08/15 1000  Gross per 24 hour  Intake 3853.73 ml  Output      0 ml  Net 3853.73 ml    Exam  General: Well developed, respiratory distress  HEENT: NCAT, mucous membranes moist.   Cardiovascular: S1 S2 auscultated, no rubs, murmurs or gallops. Regular rate and rhythm.  Respiratory: Audible stridor with labored breathing-use of accessory muscles   Abdomen: Soft, nontender, nondistended, + bowel sounds, peg in place  Extremities: warm dry without cyanosis clubbing or edema  Neuro: AAOx1, follows some commands, aphasic, right sided hemiparesis  Data Review  Micro Results Recent Results (from the past 240 hour(s))  Culture, Urine     Status: None   Collection Time: 05/29/15  4:59 PM  Result Value Ref Range Status   Specimen Description URINE, CLEAN CATCH  Final   Special Requests NONE  Final   Culture >=100,000 COLONIES/mL ESCHERICHIA COLI  Final   Report Status 06/01/2015 FINAL  Final   Organism ID, Bacteria ESCHERICHIA COLI  Final      Susceptibility   Escherichia coli - MIC*    AMPICILLIN <=2 SENSITIVE Sensitive     CEFAZOLIN <=4 SENSITIVE Sensitive     CEFTRIAXONE <=1 SENSITIVE Sensitive     CIPROFLOXACIN <=0.25 SENSITIVE Sensitive     GENTAMICIN <=1 SENSITIVE Sensitive     IMIPENEM <=0.25 SENSITIVE Sensitive     NITROFURANTOIN <=16 SENSITIVE Sensitive     TRIMETH/SULFA <=20 SENSITIVE Sensitive     AMPICILLIN/SULBACTAM <=2 SENSITIVE Sensitive     PIP/TAZO <=4 SENSITIVE Sensitive     * >=100,000 COLONIES/mL ESCHERICHIA COLI  Culture, blood (x 2)     Status: None (Preliminary result)   Collection Time: 06/07/15  8:19 AM  Result Value Ref Range Status   Specimen Description BLOOD RIGHT HAND  Final   Special Requests BOTTLES DRAWN AEROBIC ONLY 4ML  Final    Culture NO GROWTH 1 DAY  Final   Report Status PENDING  Incomplete  Culture, blood (x 2)     Status: None (Preliminary result)   Collection Time: 06/07/15  8:25 AM  Result Value Ref Range Status   Specimen Description BLOOD RIGHT HAND  Final   Special Requests BOTTLES DRAWN AEROBIC ONLY 1ML  Final   Culture NO GROWTH 1 DAY  Final   Report Status PENDING  Incomplete    Radiology Reports Dg Chest 2 View  06/05/2015  CLINICAL DATA:  Shortness of breath with exertion, personal history of breast cancer, patient smokes EXAM: CHEST  2 VIEW COMPARISON:  05/14/2015 FINDINGS: Stable mild cardiac enlargement. Vascular pattern normal. No consolidation or effusion. IMPRESSION: No active cardiopulmonary disease. Electronically Signed   By: Skipper Cliche M.D.   On: 06/05/2015 10:39   Ct Angio Chest Pe W/cm &/or Wo Cm  06/07/2015  CLINICAL DATA:  Tachycardia. EKG with sinus tachycardia and T-wave abnormality. EXAM: CT ANGIOGRAPHY CHEST WITH CONTRAST TECHNIQUE: Multidetector CT imaging of the chest was performed using the standard protocol during bolus administration of intravenous contrast. Multiplanar CT image reconstructions and MIPs were obtained to evaluate the vascular anatomy. CONTRAST:  80 mL OMNIPAQUE IOHEXOL 350 MG/ML SOLN COMPARISON:  04/20/2015 FINDINGS: Technically adequate study with good opacification of the central and segmental pulmonary arteries. Filling defects are demonstrated in the right lower lobe posterior subsegmental branch vessels consistent with pulmonary embolus. This may be acute or chronic. No large central pulmonary emboli. Possible filling defect in the left atrial appendage. Cardiac enlargement with evidence of left ventricular hypertrophy. R 8 RV ratio is less than 1 suggesting no evidence of right heart strain. Small pericardial effusion or thickening. Esophagus is decompressed. No significant lymphadenopathy in the chest. Evaluation of lungs is limited due to respiratory  motion artifact. Probable atelectasis in the lung bases. Emphysematous changes in the upper lungs. No pleural effusions. No pneumothorax. Included portions of the upper abdominal organs demonstrate calcified granulomas in the spleen. Degenerative changes in the spine. Review of the MIP images confirms the above findings. IMPRESSION: Subsegmental right lower lobe pulmonary emboli. No evidence of right heart strain. Possible filling defect in  the left atrial appendage. Cardiac enlargement with left ventricular hypertrophy. No evidence of active pulmonary disease. These results were called by telephone at the time of interpretation on 06/07/2015 at 12:44 am to [facility], the patient's nurse on MC-4w, who verbally acknowledged these results. Electronically Signed   By: Lucienne Capers M.D.   On: 06/07/2015 00:47   Dg Chest Port 1 View  05/14/2015  CLINICAL DATA:  Respiratory failure. Tracheostomy. Status post decannulation. EXAM: PORTABLE CHEST 1 VIEW COMPARISON:  04/27/2015 FINDINGS: Tracheostomy tube and right arm PICC line have been removed. The patient is partially rotated to the right. Both lungs are clear. No evidence of pneumothorax or pleural effusion. Heart size is stable. IMPRESSION: No active disease. Electronically Signed   By: Earle Gell M.D.   On: 05/14/2015 13:53   Dg Abd Portable 1v  06/04/2015  CLINICAL DATA:  Hematemesis. EXAM: PORTABLE ABDOMEN - 1 VIEW COMPARISON:  04/29/2015 FINDINGS: No evidence of bowel obstruction or ileus. Tube overlying the right abdomen appears to represent a gastrostomy tube. The region of the stomach is cut off at the top of the film. IMPRESSION: No evidence of bowel obstruction or ileus. Electronically Signed   By: Aletta Edouard M.D.   On: 06/04/2015 17:22   Dg Swallowing Func-speech Pathology  06/06/2015  Objective Swallowing Evaluation:   Patient Details Name: ARMETA HAMMAD MRN: [id number] Date of Birth: [birth year] Today's Date: 06/06/2015 Time: SLP Start  Time (ACUTE ONLY): 0902-SLP Stop Time (ACUTE ONLY): 0930 SLP Time Calculation (min) (ACUTE ONLY): 28 min Past Medical History: Past Medical History Diagnosis Date . Hypertension  . Coronary artery disease  . Diabetes mellitus  . Breast cancer ([place]) 03/10/2007   Left breat . Degenerative disc disease, cervical  . Degenerative disc disease, lumbar  . Fibromyalgia  . Hyperlipidemia  . Carpal tunnel syndrome on right  Past Surgical History: Past Surgical History Procedure Laterality Date . Cardiac surgery   . Appendectomy   . Breast surgery   . Mastectomy Left 03/2007 . Cardiac catheterization  2005 . Vein surgery Right 2005 . Tubal ligation Right  . Radiology with anesthesia N/A 04/11/2015   Procedure: RADIOLOGY WITH ANESTHESIA;  Surgeon: Luanne Bras, MD;  Location: [place];  Service: Radiology;  Laterality: N/A; . Cardiac catheterization N/A 04/13/2015   Procedure: Temporary Pacemaker;  Surgeon: Peter M Martinique, MD;  Location: [place] [facility];  Service: Cardiovascular;  Laterality: N/A; . Peg placement N/A 04/18/2015   Procedure: PERCUTANEOUS ENDOSCOPIC GASTROSTOMY (PEG) PLACEMENT;  Surgeon: Judeth Horn, MD;  Location: [place];  Service: General;  Laterality: N/A;  bedside . Esophagogastroduodenoscopy N/A 04/18/2015   Procedure: ESOPHAGOGASTRODUODENOSCOPY (EGD);  Surgeon: Judeth Horn, MD;  Location: [facility] END[facility];  Service: General;  Laterality: N/A; HPI: SHAKA TANEY is a 59 y.o. female who was admitted on 04/11/15 with difficulty speaking, right facial and right sided weakness. CT showed dense L-MCA sign and TPA started but she had worsening of right hemiparesis with lethargy. Post procedure developed acute respiratory failure with dense right hemiparesis with global aphasia. She was vent dependent and developed small patchy areas of hemorrhage within acute Large L-MCA infarct as well as cerebral edema. She continued to have lethargy with decrease in LOC requiring PEG placement by Dr. Hulen Skains and Lurline Idol  with BAL performed by Dr Nelda Marseille.  She was weaned to trach collar but continued to have significant secretions requiring frequent suctioning.  She was discharged to [facility] on 11/02 for medical management and rehab. She was decannulated on  11/21.  MBS done 11/14 and she was started on dysphagia 1, honey liquids on 11/21 and tube feeds ongoing as supplement. She was felt to be a good candidate for intensive rehab program and CIR was recommended for follow up therapy.  Repeat objective swallow study ordered today to determine readiness for diet progression.   No Data Recorded Assessment / Plan / Recommendation CHL IP CLINICAL IMPRESSIONS 06/06/2015 Therapy Diagnosis Mild oral phase dysphagia;Moderate pharyngeal phase dysphagia Clinical Impression Pt presents with improvements in swallowing function in comparison to initial MBS.  Pt now presents with a mild oral phase dysphagia and moderate pharyngeal phase dysphagia with both sensory and motor components.  Oral phase deficits are characterized by right sided labial and lingual weakness which results in weakened manipulation and prolonged oral transit of boluses to the oropharynx.  Decreased pharyngeal sensation and decreased base of tongue retraction also resulted in premature spillage of materials into the pharynx and delayed swallow initiation.  Swallow response was delayed to the vallecula with thickened liquids and solids, and further delayed to the pyriforms with thin liquids.  Delayed swallow initiation resulted in deep, silent penetration of thin liquids.    Pharyngeal weakness also resulted in mild-moderate residuals remaining in the vallecula post swallow.  Vallecular residuals were noted to spill over the epiglottis into the laryngeal vestibule and resulted in high flash penetration of solids during the swallow as well as trace aspiration after the swallow during consumption of thin liquids.   Pt was noted to spontaneously use extra swallows which, in combination  with SLP interventions for alternating solids and liquids effectively minimized the amount of residue in the pharynx.  Recommend diet progression to dys 2 textures and nectar thick liquids with full supervision for use of swallowing precautions.  Pt would also benefit from the water protocol to continue working towards further liquids progression.  Prognosis for advancement good with trials of advanced consistencies, SLP follow up for use of safe swallowing strategies, and repeat objective swallow study in 1-2 weeks.   Impact on safety and function Risk for inadequate nutrition/hydration;Moderate aspiration risk   No flowsheet data found.  CHL IP DIET RECOMMENDATION 06/06/2015 SLP Diet Recommendations Dysphagia 2 (Fine chop) solids;Nectar thick liquid Liquid Administration via Cup Medication Administration Other (Comment) Compensations Minimize environmental distractions;Slow rate;Small sips/bites;Multiple dry swallows after each bite/sip;Follow solids with liquid Postural Changes --   No flowsheet data found.         CHL IP ORAL PHASE 06/06/2015 Oral Phase Impaired Oral - Pudding Teaspoon -- Oral - Pudding Cup -- Oral - Honey Teaspoon -- Oral - Honey Cup Lingual/palatal residue;Decreased bolus cohesion;Weak lingual manipulation;Reduced posterior propulsion;Delayed oral transit;Premature spillage Oral - Nectar Teaspoon -- Oral - Nectar Cup Weak lingual manipulation;Reduced posterior propulsion;Premature spillage;Delayed oral transit;Decreased bolus cohesion;Lingual/palatal residue Oral - Nectar Straw -- Oral - Thin Teaspoon -- Oral - Thin Cup Weak lingual manipulation;Reduced posterior propulsion;Premature spillage;Delayed oral transit;Decreased bolus cohesion;Lingual/palatal residue Oral - Thin Straw Premature spillage;Lingual/palatal residue;Decreased bolus cohesion Oral - Puree Weak lingual manipulation;Reduced posterior propulsion;Lingual/palatal residue;Premature spillage;Delayed oral transit Oral - Mech Soft  -- Oral - Regular Weak lingual manipulation;Reduced posterior propulsion;Lingual/palatal residue;Premature spillage Oral - Multi-Consistency -- Oral - Pill -- Oral Phase - Comment --  CHL IP PHARYNGEAL PHASE 06/06/2015 Pharyngeal Phase Impaired Pharyngeal- Pudding Teaspoon -- Pharyngeal -- Pharyngeal- Pudding Cup -- Pharyngeal -- Pharyngeal- Honey Teaspoon -- Pharyngeal -- Pharyngeal- Honey Cup Delayed swallow initiation-vallecula;Reduced tongue base retraction;Reduced laryngeal elevation;Reduced anterior laryngeal mobility;Pharyngeal residue - valleculae Pharyngeal --  Pharyngeal- Nectar Teaspoon -- Pharyngeal -- Pharyngeal- Nectar Cup Delayed swallow initiation-vallecula;Reduced tongue base retraction;Reduced anterior laryngeal mobility;Reduced laryngeal elevation;Pharyngeal residue - valleculae Pharyngeal -- Pharyngeal- Nectar Straw -- Pharyngeal -- Pharyngeal- Thin Teaspoon -- Pharyngeal -- Pharyngeal- Thin Cup Reduced airway/laryngeal closure;Reduced tongue base retraction;Reduced anterior laryngeal mobility;Reduced laryngeal elevation;Pharyngeal residue - valleculae;Pharyngeal residue - pyriform;Penetration/Aspiration during swallow;Penetration/Apiration after swallow;Delayed swallow initiation-pyriform sinuses Pharyngeal Material enters airway, CONTACTS cords and not ejected out;Material enters airway, passes BELOW cords without attempt by patient to eject out (silent aspiration) Pharyngeal- Thin Straw Compensatory strategies attempted (with notebox);Penetration/Aspiration during swallow;Reduced airway/laryngeal closure;Reduced anterior laryngeal mobility;Reduced laryngeal elevation;Reduced tongue base retraction;Pharyngeal residue - pyriform;Pharyngeal residue - valleculae Pharyngeal Material enters airway, CONTACTS cords and then ejected out Pharyngeal- Puree Delayed swallow initiation-vallecula;Reduced tongue base retraction;Pharyngeal residue - valleculae;Reduced anterior laryngeal mobility Pharyngeal --  Pharyngeal- Mechanical Soft -- Pharyngeal -- Pharyngeal- Regular Delayed swallow initiation-vallecula;Reduced anterior laryngeal mobility;Reduced tongue base retraction;Pharyngeal residue - valleculae Pharyngeal -- Pharyngeal- Multi-consistency -- Pharyngeal -- Pharyngeal- Pill -- Pharyngeal -- Pharyngeal Comment --  No flowsheet data found. Page, Selinda Orion 06/06/2015, 12:37 PM               CBC  Recent Labs Lab 06/06/15 1928 06/07/15 0836 06/07/15 1216 06/07/15 1836 06/07/15 2321 06/08/15 0559  WBC 15.4* 10.0 9.5 7.4 9.3 11.7*  HGB 13.6 11.6* 11.9* 11.6* 11.7* 11.6*  HCT 42.4 37.4 37.0 37.5 36.0 35.9*  PLT 354 250 237 221 228 229  MCV 86.9 89.3 88.5 88.4 87.6 88.2  MCH 27.9 27.7 28.5 27.4 28.5 28.5  MCHC 32.1 31.0 32.2 30.9 32.5 32.3  RDW 14.6 14.9 14.8 14.6 14.5 14.5  LYMPHSABS 3.9 3.3  --   --   --   --   MONOABS 1.0 0.7  --   --   --   --   EOSABS 0.0 0.1  --   --   --   --   BASOSABS 0.0 0.0  --   --   --   --     Chemistries   Recent Labs Lab 06/04/15 1630 06/05/15 0335 06/06/15 1928 06/07/15 0836 06/08/15 0559  NA 139 138 138 140 139  K 3.3* 3.5 3.1* 2.8* 3.8  CL 100* 99* 97* 105 106  CO2 29 29 29 27 25   GLUCOSE 111* 88 171* 129* 117*  BUN 12 8 18 18 6   CREATININE 0.54 0.46 0.53 0.48 0.44  CALCIUM 9.6 9.3 9.3 8.6* 8.4*  AST  --   --  20 19 20   ALT  --   --  25 21 18   ALKPHOS  --   --  74 62 66  BILITOT  --   --  0.4 0.3 0.5   ------------------------------------------------------------------------------------------------------------------ estimated creatinine clearance is 82.9 mL/min (by C-G formula based on Cr of 0.44). ------------------------------------------------------------------------------------------------------------------ No results for input(s): HGBA1C in the last 72 hours. ------------------------------------------------------------------------------------------------------------------ No results for input(s): CHOL, HDL, LDLCALC, TRIG,  CHOLHDL, LDLDIRECT in the last 72 hours. ------------------------------------------------------------------------------------------------------------------ No results for input(s): TSH, T4TOTAL, T3FREE, THYROIDAB in the last 72 hours.  Invalid input(s): FREET3 ------------------------------------------------------------------------------------------------------------------ No results for input(s): VITAMINB12, FOLATE, FERRITIN, TIBC, IRON, RETICCTPCT in the last 72 hours.  Coagulation profile  Recent Labs Lab 06/07/15 0836  INR 1.32     Recent Labs  06/06/15 1928  DDIMER 0.58*    Cardiac Enzymes  Recent Labs Lab 06/07/15 0443 06/07/15 1216 06/07/15 1836  TROPONINI 0.22* 0.18* 0.19*   ------------------------------------------------------------------------------------------------------------------ Invalid input(s): POCBNP    MIKHAIL, MARYANN D.O.  on 06/08/2015 at 11:38 AM  Between 7am to 7pm - Pager - [phone]  After 7pm go to [email or web address] - password [email or web address]  And look for the night coverage person covering for me after hours  [facility] Office  [phone]

## 2015-06-08 NOTE — Progress Notes (Signed)
Paged NP Lynch to come see patient, patient less responsive, orders to get head CT. Will continue to monitor closely.

## 2015-06-08 NOTE — Progress Notes (Signed)
Dr. Ree Kida notified pt's BP back up to 180s/80s,  HR increased, and O2 sats 92%.  Pt incontinent of urine soaking bed pad at least every hour and HR increasing and BP increasing with movement in bed.  Unable to monitor how much urine pt is voiding as pt is incontinent.  Orders received for foley to monitor pt's output closely.  Other orders received.  Foley huddle done.  Foley placed. Will continue to monitor.

## 2015-06-08 NOTE — Progress Notes (Signed)
ANTICOAGULATION CONSULT NOTE - Initial Consult  Pharmacy Consult for Xarelto Indication: pulmonary embolus  Allergies  Allergen Reactions  . Codeine     Nausea?-- "made her sick"  . Oxycodone Nausea Only    Sick   . Oxycontin [Oxycodone Hcl] Nausea Only    Sick     Patient Measurements: Weight: 176 lb 9.4 oz (80.1 kg)   Vital Signs: Temp: 98 F (36.7 C) (12/16 1100) Temp Source: Oral (12/16 1100) BP: 174/85 mmHg (12/16 1100) Pulse Rate: 96 (12/16 1100)  Labs:  Recent Labs  06/06/15 1928  06/07/15 0443 06/07/15 0836 06/07/15 1216 06/07/15 1836 06/07/15 2321 06/08/15 0559 06/08/15 1234  HGB 13.6  --   --  11.6* 11.9* 11.6* 11.7* 11.6* 12.2  HCT 42.4  --   --  37.4 37.0 37.5 36.0 35.9* 37.3  PLT 354  --   --  250 237 221 228 229 257  APTT  --   --   --  77*  --   --   --   --   --   LABPROT  --   --   --  16.5*  --   --   --   --   --   INR  --   --   --  1.32  --   --   --   --   --   HEPARINUNFRC  --   --   --   --  0.71*  --   --  0.33  --   CREATININE 0.53  --   --  0.48  --   --   --  0.44  --   TROPONINI 0.46*  < > 0.22*  --  0.18* 0.19*  --   --   --   < > = values in this interval not displayed.  Estimated Creatinine Clearance: 82.9 mL/min (by C-G formula based on Cr of 0.44).   Assessment: 57 YOF with history of stroke in November now with PE/DVT. Dr. Ree Kida spoke with stroke team and they okay'd staring anticoagulation and stopping Plavix.  Awaiting full benefits check to be completed, but per conversation with Case Manager Jackelyn Poling, patient has private insurance and qualifies to receive 30-day free cards for Xarelto and Eliquis. Co-pay would be $0 for Xarelto and $10 for Eliquis- decision made with Dr. Ree Kida to start [place].  Hgb 12.2, plts 257. No bleeding noted. SCr 0.44, CrCL ~80-57mL/min.  Goal of Therapy:    Monitor platelets by anticoagulation protocol: Yes   Plan:  -Xarelto 15mg  BID x21days, then Xarelto 20mg  qsupper starting  06/30/15 -continue heparin at 1200 units/hr until first dose of Xarelto given -CBC q72h -follow for s/s bleeding -education will be provided to patient -discontinue Plavix  Kenzee Bassin D. Adora Yeh, PharmD, BCPS Clinical Pharmacist Pager: [phone] 06/08/2015 4:04 PM

## 2015-06-08 NOTE — Progress Notes (Signed)
ANTICOAGULATION CONSULT NOTE - Follow Up Consult  Pharmacy Consult for heparin Indication: pulmonary embolus  Allergies  Allergen Reactions  . Codeine     Nausea?-- "made her sick"  . Oxycodone Nausea Only    Sick   . Oxycontin [Oxycodone Hcl] Nausea Only    Sick     Patient Measurements: Weight: 176 lb 9.4 oz (80.1 kg)  Vital Signs: Temp: 97.8 F (36.6 C) (12/16 0730) Temp Source: Oral (12/16 0730) BP: 177/87 mmHg (12/16 0730) Pulse Rate: 104 (12/16 0730)  Labs:  Recent Labs  06/06/15 1928  06/07/15 0443 06/07/15 0836 06/07/15 1216 06/07/15 1836 06/07/15 2321 06/08/15 0559  HGB 13.6  --   --  11.6* 11.9* 11.6* 11.7* 11.6*  HCT 42.4  --   --  37.4 37.0 37.5 36.0 35.9*  PLT 354  --   --  250 237 221 228 229  APTT  --   --   --  77*  --   --   --   --   LABPROT  --   --   --  16.5*  --   --   --   --   INR  --   --   --  1.32  --   --   --   --   HEPARINUNFRC  --   --   --   --  0.71*  --   --  0.33  CREATININE 0.53  --   --  0.48  --   --   --  0.44  TROPONINI 0.46*  < > 0.22*  --  0.18* 0.19*  --   --   < > = values in this interval not displayed.  Estimated Creatinine Clearance: 82.9 mL/min (by C-G formula based on Cr of 0.44).   Assessment: 11 yoF admitted 06/07/2015 with PE shown on CT scan. Pharmacy consulted to dose heparin for R lower lobe PE. Patient currently on heparin at 1150 units/hr  HL 0.33 (therapeutic; lower end of range), H/H stable, Plt wnl. No s/sx of bleeding noted.  Goal of Therapy:  Heparin level 0.3-0.7 units/ml Monitor platelets by anticoagulation protocol: Yes   Plan:  - Increase heparin drip to 1200 units/hr - Monitor daily HL, CBC and s/sx of bleeding  Dimitri Ped, PharmD. PGY-1 Pharmacy Resident Pager: [phone]  06/08/2015,8:28 AM

## 2015-06-08 NOTE — Progress Notes (Signed)
Name: Kerry [name] MRN: [id number] DOB: 04-29-1956    ADMISSION DATE:  06/07/2015 CONSULTATION DATE:  06/07/15  REFERRING MD :  Letta Pate  CHIEF COMPLAINT:  Hypotension, RLL PE and Right CFV DVT  BRIEF PATIENT DESCRIPTION: 59 y.o. female  Initially admitted to [facility] 10/19 with L MCA infarct.  She had stenting and embolectomy by IR and was later discharged to [facility] before being admitted to [facility] 11/23.  On 12/15, she had tachycardia, tachypnea, and hypotension and was found to have small RLL PE and Rt CFV DVT.  PCCM called for recs.  SIGNIFICANT EVENTS  10/19 > admitted with L MCA infarct. 10/26 > trach / PEG. 11/4 > discharged to [facility]. 11/23 > admitted to CIR. 12/15 > tachycardia and hypotension > small RLL subsegmental PE found > PCCM consulted.  STUDIES:  CTA chest 12/14 > subsegmental RLL PE.  HISTORY OF PRESENT ILLNESS: Patient is aphasic hence history is obtained from nursing and from chart review.  FOREVER FUENTE is a 59 y.o. female with a PMH as outlined below including recent L MCA infarct s/p tPA (admitted 04/11/15).  She had mild hemorrhage post tPA.  She was then taken to IR for L carotid stent and embolectomy with TICI 2A revascularization, then placed on ASA / plavix.  She had PEG and trach 10/26 (has since been decannulated, unknown date).  She was discharged from [facility] and sent to [facility] 04/27/15.  She was then admitted to CIR at [facility] on 11/23 for continued rehab efforts.  On 12/14, she had tachycardia for which cardiology was consulted. D-dimer was obtained which was positive, prompting CTA of the chest.  CTA revealed small subsegmental RLL PE. Dopplers of the lower extremities done 12/15 show a non-occlusive DVT of the CFV. PCCM was consulted for further recs.  SUBJECTIVE: No events overnight, continues to have stridor and difficulty breathing.  VITAL SIGNS: Temp:  [97.6 F (36.4 C)-98 F (36.7 C)] 98 F (36.7 C) (12/16 1100) Pulse Rate:  [80-113] 96 (12/16  1100) Resp:  [22-31] 25 (12/16 1100) BP: (146-185)/(67-90) 174/85 mmHg (12/16 1100) SpO2:  [94 %-100 %] 99 % (12/16 1100) Weight:  [80.1 kg (176 lb 9.4 oz)] 80.1 kg (176 lb 9.4 oz) (12/16 0300)  PHYSICAL EXAMINATION: General: Adult female, resting in bed, in respiratory distress HEENT: [place]/AT. PERRL, sclerae anicteric. Cardiovascular: RRR, no M/R/G.  Lungs: Respirations labored with significant use of accessory muscles, audible upper airway stridor (chronic per RN - she apparently breathes like this even when awake). Neuro: Alert and oriented x1; able to follow basic commands, right facial droop, tongue midline,  right hemiplegia, speech is garbled with few audible words. Abdomen: PEG in place.  BS x 4, soft, NT/ND.  Musculoskeletal: No gross deformities, no edema.  Skin: Intact, warm, no rashes.  Recent Labs Lab 06/06/15 1928 06/07/15 0836 06/08/15 0559  NA 138 140 139  K 3.1* 2.8* 3.8  CL 97* 105 106  CO2 29 27 25   BUN 18 18 6   CREATININE 0.53 0.48 0.44  GLUCOSE 171* 129* 117*   Recent Labs Lab 06/07/15 1836 06/07/15 2321 06/08/15 0559  HGB 11.6* 11.7* 11.6*  HCT 37.5 36.0 35.9*  WBC 7.4 9.3 11.7*  PLT 221 228 229   Ct Angio Chest Pe W/cm &/or Wo Cm  06/07/2015  CLINICAL DATA:  Tachycardia. EKG with sinus tachycardia and T-wave abnormality. EXAM: CT ANGIOGRAPHY CHEST WITH CONTRAST TECHNIQUE: Multidetector CT imaging of the chest was performed using the standard protocol during bolus  administration of intravenous contrast. Multiplanar CT image reconstructions and MIPs were obtained to evaluate the vascular anatomy. CONTRAST:  80 mL OMNIPAQUE IOHEXOL 350 MG/ML SOLN COMPARISON:  04/20/2015 FINDINGS: Technically adequate study with good opacification of the central and segmental pulmonary arteries. Filling defects are demonstrated in the right lower lobe posterior subsegmental branch vessels consistent with pulmonary embolus. This may be acute or chronic. No large central  pulmonary emboli. Possible filling defect in the left atrial appendage. Cardiac enlargement with evidence of left ventricular hypertrophy. R 8 RV ratio is less than 1 suggesting no evidence of right heart strain. Small pericardial effusion or thickening. Esophagus is decompressed. No significant lymphadenopathy in the chest. Evaluation of lungs is limited due to respiratory motion artifact. Probable atelectasis in the lung bases. Emphysematous changes in the upper lungs. No pleural effusions. No pneumothorax. Included portions of the upper abdominal organs demonstrate calcified granulomas in the spleen. Degenerative changes in the spine. Review of the MIP images confirms the above findings. IMPRESSION: Subsegmental right lower lobe pulmonary emboli. No evidence of right heart strain. Possible filling defect in the left atrial appendage. Cardiac enlargement with left ventricular hypertrophy. No evidence of active pulmonary disease. These results were called by telephone at the time of interpretation on 06/07/2015 at 12:44 am to [facility], the patient's nurse on [facility], who verbally acknowledged these results. Electronically Signed   By: Lucienne Capers M.D.   On: 06/07/2015 00:47   I reviewed chest CT myself, clear evidence of PE in the RLL.  ASSESSMENT / PLAN This is a 59 yo White female with multiple comorbidities and a long and complicated hospital course presenting with a subsegmental right lower lobe pulmonary embolism without right heart strain and right focal, acute, non occlusive DVT noted in the CFV. She is hemodynamically stable and her respiratory stridor seems to be chronic and not related to this new PE. She is saturating at 100% on 2L [place]. Based on the size of the PE, and  her clinical presentation there is no indication for either  catheter directed tPA or systemic tPA. Also her  recent CVA with hemorrhagic conversion precludes her from long-term oral anticoagulation.   Plan RLL subsegmental PE   and Right non-occlusive DVT of the CFV -Continue heparin gtt -Monitor Platelets and mental status -No indication for  IVC filter at this time -Consult with neurology regarding risks vs. benefits of long term anticoagulation given recent CVA with hemorrhagic conversion s/p tPA.   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 -Dexamethasone 4mg  IV X1  -Racepinephrine 0.67ml via neb x 1 -ENT consult called to evaluate vocal cords.  Discussed with bedside RN.  Rush Farmer, M.D. [facility] Pulmonary/Critical Care Medicine. Pager: [phone]. After hours pager: [phone].

## 2015-06-08 NOTE — Evaluation (Signed)
Physical Therapy Evaluation Patient Details Name: LARIKA TIEDEMAN MRN: [id number] DOB: September 17, 1955 Today's Date: 06/08/2015   History of Present Illness  pt presents with dense L MCA Infarct involving L Temporal, Frontoparietal, and L Basal Ganglia regions and was started on TPA, but unable to complete full dose due to hypertension, but did recieve IR intervention. Pt extubated 10/30. pt with hx of DM, HTN, Breast CA s/p Mastectomy, and Fibromyalgia.  Patient transferred to [facility] and then Inpatient Rehab.  Transferred back to acute on 06/07/15 due to PE.  Clinical Impression  Patient with recent tranfer from CIR back to Acute due to PE.  Patient was scheduled to d/c from CIR on 12/17.  Patient with flaccid right side leading to dependencies in mobility, balance, and transfers.  Patient will benefit from continued PT to progress mobility and ensure family education is completed for eventual d/c home.      Follow Up Recommendations Home health PT    Equipment Recommendations       Recommendations for Other Services       Precautions / Restrictions Precautions Precautions: Fall Precaution Comments: Peg, Rt shoulder subluxation Restrictions Weight Bearing Restrictions: No      Mobility  Bed Mobility Overal bed mobility: Needs Assistance Bed Mobility: Rolling;Sidelying to Sit;Sit to Sidelying Rolling: Mod assist Sidelying to sit: Mod assist     Sit to sidelying: Mod assist General bed mobility comments: patient used rail to roll to side; assistance to facilitate raising shoulders off bed and to lower back to bed.  Needs assistance for RLE  Transfers                    Ambulation/Gait                Stairs            Wheelchair Mobility    Modified Rankin (Stroke Patients Only) Modified Rankin (Stroke Patients Only) Pre-Morbid Rankin Score: No symptoms Modified Rankin: Severe disability     Balance Overall balance  assessment: Needs assistance Sitting-balance support: No upper extremity supported Sitting balance-Leahy Scale: Fair Sitting balance - Comments: required occasional verbal cues to maintain balance/upright.                                     Pertinent Vitals/Pain Pain Assessment: Faces Faces Pain Scale: No hurt    Home Living   Living Arrangements: Spouse/significant other;Other (Comment) Available Help at Discharge: Family;Available 24 hours/day Type of Home: House Home Access: Stairs to enter Entrance Stairs-Rails: Right;Left;Can reach both Entrance Stairs-Number of Steps:  (3 steps) Home Layout: Two level;Able to live on main level with bedroom/bathroom        Prior Function Level of Independence: Independent         Comments: On CIR - husband has been educated on tranfers, w/c mobility.     Hand Dominance   Dominant Hand: Right    Extremity/Trunk Assessment   Upper Extremity Assessment: Defer to OT evaluation           Lower Extremity Assessment: RLE deficits/detail RLE Deficits / Details: flaccid, no movement noted    Cervical / Trunk Assessment: Kyphotic  Communication   Communication: Expressive difficulties  Cognition Arousal/Alertness: Awake/alert Behavior During Therapy: WFL for tasks assessed/performed Overall Cognitive Status: Difficult to assess  General Comments      Exercises        Assessment/Plan    PT Assessment Patient needs continued PT services  PT Diagnosis Hemiplegia dominant side   PT Problem List Decreased activity tolerance;Decreased balance;Decreased mobility;Decreased strength;Decreased range of motion;Cardiopulmonary status limiting activity;Impaired tone  PT Treatment Interventions DME instruction;Functional mobility training;Therapeutic activities;Therapeutic exercise;Balance training;Neuromuscular re-education;Patient/family education   PT Goals (Current goals can be  found in the Care Plan section) Acute Rehab PT Goals Patient Stated Goal: Husband:  get her stronger PT Goal Formulation: With patient/family Time For Goal Achievement: 06/22/15 Potential to Achieve Goals: Fair    Frequency Min 3X/week   Barriers to discharge        Co-evaluation               End of Session Equipment Utilized During Treatment: Oxygen Activity Tolerance: Patient tolerated treatment well Patient left: in bed;with call bell/phone within reach;with family/visitor present           Time: 1032-1053 PT Time Calculation (min) (ACUTE ONLY): 21 min   Charges:   PT Evaluation $Initial PT Evaluation Tier I: 1 Procedure     PT G CodesShanna Cisco 06/08/2015, 11:10 AM  06/08/2015 [place], PT [phone]

## 2015-06-08 NOTE — Progress Notes (Signed)
abg collected  

## 2015-06-09 DIAGNOSIS — R4182 Altered mental status, unspecified: Secondary | ICD-10-CM | POA: Insufficient documentation

## 2015-06-09 DIAGNOSIS — R4701 Aphasia: Secondary | ICD-10-CM

## 2015-06-09 LAB — BASIC METABOLIC PANEL
Anion gap: 9 (ref 5–15)
BUN: 6 mg/dL (ref 6–20)
CALCIUM: 8.4 mg/dL — AB (ref 8.9–10.3)
CHLORIDE: 103 mmol/L (ref 101–111)
CO2: 26 mmol/L (ref 22–32)
Creatinine, Ser: 0.38 mg/dL — ABNORMAL LOW (ref 0.44–1.00)
GFR calc Af Amer: >60 mL/min (ref >60)
GLUCOSE: 149 mg/dL — AB (ref 65–99)
POTASSIUM: 5.9 mmol/L — AB (ref 3.5–5.1)
Sodium: 138 mmol/L (ref 135–145)

## 2015-06-09 LAB — GLUCOSE, CAPILLARY
GLUCOSE-CAPILLARY: 134 mg/dL — AB (ref 65–99)
Glucose-Capillary: 138 mg/dL — ABNORMAL HIGH (ref 65–99)
Glucose-Capillary: 193 mg/dL — ABNORMAL HIGH (ref 65–99)

## 2015-06-09 LAB — CBC
HCT: 37.3 % (ref 36.0–46.0)
HEMOGLOBIN: 12.3 g/dL (ref 12.0–15.0)
MCH: 28.8 pg (ref 26.0–34.0)
MCHC: 33 g/dL (ref 30.0–36.0)
MCV: 87.4 fL (ref 78.0–100.0)
PLATELETS: 243 10*3/uL (ref 150–400)
RBC: 4.27 MIL/uL (ref 3.87–5.11)
RDW: 14.7 % (ref 11.5–15.5)
WBC: 7.7 10*3/uL (ref 4.0–10.5)

## 2015-06-09 LAB — MAGNESIUM: MAGNESIUM: 1.7 mg/dL (ref 1.7–2.4)

## 2015-06-09 MED ORDER — SODIUM CHLORIDE 0.9 % IV SOLN
1.0000 g | Freq: Once | INTRAVENOUS | Status: AC
  Administered 2015-06-09: 1 g via INTRAVENOUS
  Filled 2015-06-09: qty 10

## 2015-06-09 MED ORDER — DEXTROSE 50 % IV SOLN
1.0000 | Freq: Once | INTRAVENOUS | Status: AC
  Administered 2015-06-09: 50 mL via INTRAVENOUS
  Filled 2015-06-09: qty 50

## 2015-06-09 MED ORDER — SODIUM POLYSTYRENE SULFONATE 15 GM/60ML PO SUSP
15.0000 g | Freq: Once | ORAL | Status: AC
  Administered 2015-06-09: 15 g
  Filled 2015-06-09: qty 60

## 2015-06-09 MED ORDER — INSULIN ASPART 100 UNIT/ML IV SOLN
10.0000 [IU] | Freq: Once | INTRAVENOUS | Status: AC
  Administered 2015-06-09: 10 [IU] via INTRAVENOUS

## 2015-06-09 NOTE — Progress Notes (Signed)
Name: Kerry [name] MRN: [id number] DOB: 17-Feb-1956    ADMISSION DATE:  06/07/2015 CONSULTATION DATE:  06/07/15  REFERRING MD :  Letta Pate  CHIEF COMPLAINT:  Hypotension, RLL PE and Right CFV DVT  BRIEF PATIENT DESCRIPTION: 59 y.o. female  Initially admitted to [facility] 10/19 with L MCA infarct.  She had stenting and embolectomy by IR and was later discharged to [facility] before being admitted to [facility] 11/23.  On 12/15, she had tachycardia, tachypnea, and hypotension and was found to have small RLL PE and Rt CFV DVT.  PCCM called for recs.  SIGNIFICANT EVENTS  10/19 > admitted with L MCA infarct. 10/26 > trach / PEG. 11/4 > discharged to [facility]. 11/23 > admitted to CIR. 12/15 > tachycardia and hypotension > small RLL subsegmental PE found > PCCM consulted.  STUDIES:  CTA chest 12/14 > subsegmental RLL PE.  HISTORY OF PRESENT ILLNESS: Patient is aphasic hence history is obtained from nursing and from chart review.  Kerry [name] is a 59 y.o. female with a PMH as outlined below including recent L MCA infarct s/p tPA (admitted 04/11/15).  She had mild hemorrhage post tPA.  She was then taken to IR for L carotid stent and embolectomy with TICI 2A revascularization, then placed on ASA / plavix.  She had PEG and trach 10/26 (has since been decannulated, unknown date).  She was discharged from [facility] and sent to [facility] 04/27/15.  She was then admitted to CIR at [facility] on 11/23 for continued rehab efforts.  On 12/14, she had tachycardia for which cardiology was consulted. D-dimer was obtained which was positive, prompting CTA of the chest.  CTA revealed small subsegmental RLL PE. Dopplers of the lower extremities done 12/15 show a non-occlusive DVT of the CFV. PCCM was consulted for further recs.  SUBJECTIVE:  insp stridor noted but no distress. Some abd muscle use.   VITAL SIGNS: Temp:  [98 F (36.7 C)-99.1 F (37.3 C)] 98.3 F (36.8 C) (12/17 0714) Pulse Rate:  [88-145] 88 (12/17 0800) Resp:  [23-43]  32 (12/17 0800) BP: (105-207)/(59-113) 130/74 mmHg (12/17 0800) SpO2:  [91 %-100 %] 100 % (12/17 0800)  PHYSICAL EXAMINATION: General: Adult female, resting in bed, in no respiratory distress HEENT: [place]/AT. PERRL, sclerae anicteric. Cardiovascular: RRR, no M/R/G.  Lungs: Respirations w mild accessory muscle use, audible upper airway stridor Neuro: Alert and oriented x2; able to follow basic commands, right facial droop, tongue midline,  right hemiplegia, speech is garbled with few audible words. Expressive aphasia Abdomen: PEG in place.  BS x 4, soft, NT/ND.  Musculoskeletal: No gross deformities, no edema.  Skin: Intact, warm, no rashes.  Recent Labs Lab 06/07/15 0836 06/08/15 0559 06/09/15 0321  NA 140 139 138  K 2.8* 3.8 5.9*  CL 105 106 103  CO2 27 25 26   BUN 18 6 6   CREATININE 0.48 0.44 0.38*  GLUCOSE 129* 117* 149*    Recent Labs Lab 06/08/15 0559 06/08/15 1234 06/09/15 0828  HGB 11.6* 12.2 12.3  HCT 35.9* 37.3 37.3  WBC 11.7* 13.6* 7.7  PLT 229 257 243   Ct Head Wo Contrast  06/09/2015  CLINICAL DATA:  Initial evaluation for deteriorating mental status. EXAM: CT HEAD WITHOUT CONTRAST TECHNIQUE: Contiguous axial images were obtained from the base of the skull through the vertex without intravenous contrast. COMPARISON:  Prior study from 04/16/2015. FINDINGS: There has been continued interval evolution of large volume left MCA territory infarct, overall stable in size and distribution as compared  to previous study. There is increased volume loss and encephalomalacia within the area of infarction. No evidence for associated hemorrhage. Previously seen midline shift has essentially resolved. No new acute large vessel territory infarct. No intracranial hemorrhage. No mass lesion. No hydrocephalus. Basilar cisterns are patent. No extra-axial fluid collection. Mild chronic microvascular ischemic disease noted within the periventricular white matter. Scattered atheromatous  plaque within the carotid siphons. Scalp soft tissues within normal limits. No acute abnormality about the orbits. Paranasal sinuses are clear.  No mastoid effusion. Calvarium intact. IMPRESSION: 1. Normal expected interval evolutional change of large left MCA territory infarct. Associated edema has largely resolved. 2. No new intracranial process. Electronically Signed   By: Jeannine Boga M.D.   On: 06/09/2015 00:47    ASSESSMENT / PLAN This is a 59 yo White female with multiple comorbidities and a long and complicated hospital course presenting with a subsegmental right lower lobe pulmonary embolism without right heart strain and right focal, acute, non occlusive DVT noted in the CFV. She is hemodynamically stable and her respiratory stridor seems to be chronic. She does have some increased WOB but is not in distress.    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  - she does not look decompensated, defer any intubation - outpt ENT eval for possible laser arytenoidectomy/partial cordotomy   Discussed with bedside RN.  Baltazar Apo, MD, PhD 06/09/2015, 9:20 AM [facility] [phone] or if no answer [phone]

## 2015-06-09 NOTE — Progress Notes (Signed)
[facility]                                                                              Patient Demographics  Kerry [name], is a 59 y.o. female, DOB - 1956-01-11, [id number]  Admit date - 06/07/2015   Admitting Physician Ivor Costa, MD  Outpatient Primary MD for the patient is [name], MD  LOS - 2   No chief complaint on file.       HPI on 06/07/2015 by Dr. Ivor Costa Kerry [name] is a 59 y.o. female with PMH of hypertension, hyperlipidemia, diabetes mellitus, GERD, CAD, breast cancer (s/P left mastectomy), DDD, carpal tunnel syndrome, recent large left MCA territory infarct with resolving associated hemorrhage after tPA treatment, s/p of PEG placement, right-sided hemiparalysis, currently doing rehab in [place] since 11/23.   Today pt developed tachycardia and hypotension. Cardiology was consulted, Dr. [name] suspected that patient may have PE, which is confirmed by CTA tonight. PCCM was consulted and recommended to start IV heparin. We are asked to admit pt. Pt has aphagia, but can tell that she has shortness of breath and mild chest pain. She does not seem to have abdominal pain or diarrhea. She is currently being treated for UTI with ancef since 12/13. CTA showed subsegmental right lower lobe pulmonary emboli. No evidence of right heart strain. Possible filling defect in the left atrial appendage.  Patient was found to have hypotension with blood pressure 82/46, potassium 3.1, troponin 0.33, lactate 1.1, tachycardia, tachypnea, temperature 99.8, WBC 15.4. Patient is admitted to stepdown bed for further eval and treatment.   Assessment & Plan  Acute pulmonary embolism/DVT -As seen on CTA chest -Patient did have transient hypotension which was thought to be likely from sepsis rather than from pulmonary embolism -PCCM was consulted and appreciated, recommended heparin drip -Echocardiogram: EF 0000000, grade 1 diastolic dysfunction -Troponin currently  trending downward, was 0.46 currently 0.19 -Lower extremity Doppler: Right focal, acute nonocclusive DVT noted in CFV -Spoke with neurology, Dr. [name], regarding [facility] as patient recently received tPA for CVA- states it is fine to start a new agent and Plavix can be discontinued. 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  Recommended humidified nasal cannula oxygen to prevent epistaxis, consider outpatient (larygnologist at [facility] center) referral for laser arytenoidectomy/partial cordotomy once her current issues improved  Sepsis secondary to Urinary tract infection -Patient was noted to have leukocytosis, tachycardia, tachypnea, hypotension and febrile -Patient is being treated with Ancef -Continue IV fluids and antibiotics -Urine culture showed pansensitive Escherichia coli  Coffee-ground emesis -Gastroenterology was consulted, Dr. Michail Sermon suspected the epistaxis was the cause. EGD was not indicated at that time. It was okay to resume Plavix on 1214 -Continue  Protonix -Hemoglobin has remained stable, currently 12.3 -Continue to monitor  CVA -Status post TPA and embolectomy by interventional radiology -Patient was admitted to inpatient rehabilitation -Patient has right-sided paresis as well as aphasia -Status post PEG placement -Continue statin, xarelto  Altered level of  consciousness -Overnight 06/08/2015, patient became somewhat unresponsive. -CT head: Normocephalic interval evolution change of the large left MCA, associated edema has largely resolved. No new intracranial process -Patient currently alert and awake this morning  Hyperlipidemia -Last LDL on 04/12/2015 was 133 -Continue statin  Essential hypertension -Patient was hypotensive which improved with fluid bolus -Continue to monitor and hold antihypertensive medications  Hypokalemia/Hyperkalemia -Potassium 5.9 this morning. -Patient given calcium gluconate, insulin, D50, Kayexalate -Continue to monitor BMP  Code Status: Full  Family Communication: None at bedside.   Disposition Plan: Admitted.  Continue to monitor closely in step down  Time Spent in minutes 30 minutes  Procedures  Lower extremity Doppler Laryngoscopy  Consults  Cardiology PCCM ENT Neurology, Dr. [name], via phone  DVT Prophylaxis Heparin  Lab Results  Component Value Date   PLT 243 06/09/2015    Medications  Scheduled Meds: . antiseptic oral rinse  7 mL Mouth Rinse q12n4p  . atorvastatin  40 mg Oral q1800  . bacitracin   Topical TID  . carvedilol  12.5 mg Oral BID WC  .  ceFAZolin (ANCEF) IV  1 g Intravenous 3 times per day  . chlorhexidine  15 mL Mouth Rinse BID  . feeding supplement (PRO-STAT SUGAR FREE 64)  30 mL Oral Daily  . feeding supplement (VITAL AF 1.2 CAL)  237 mL Per Tube TID  . folic acid  1 mg Oral Daily  . free water  200 mL Per Tube 3 times per day  . lisinopril  10 mg Oral Daily  . multivitamin with minerals  1 tablet Oral Daily  . nicotine  7 mg Transdermal Daily  . oxymetazoline  2 spray Each Nare 3 times per day  . rivaroxaban  15 mg Oral BID WC   Followed by  . [START ON 06/30/2015] rivaroxaban  20 mg Oral Q supper  . sodium chloride  3 mL Intravenous Q12H  . thiamine  100 mg Oral Daily   Continuous Infusions:   PRN Meds:.acetaminophen, bisacodyl, hydrALAZINE,  ipratropium-albuterol, nitroGLYCERIN, senna-docusate  Antibiotics    Anti-infectives    Start     Dose/Rate Route Frequency Ordered Stop   06/07/15 1400  ceFAZolin (ANCEF) IVPB 1 g/50 mL premix     1 g 100 mL/hr over 30 Minutes Intravenous 3 times per day 06/07/15 1258     06/07/15 0600  ceFAZolin (ANCEF) IVPB 1 g/50 mL premix  Status:  Discontinued     1 g 100 mL/hr over 30 Minutes Intravenous 3 times per day 06/07/15 0551 06/07/15 [id number]      Subjective:   Kerry [name] seen and examined today.  Patient has aphasia but is able to say a few words  Objective:   Filed Vitals:   06/09/15 0358 06/09/15 0400 06/09/15 0714 06/09/15 0800  BP:  132/69 130/79 130/74  Pulse: 95 99 90 88  Temp: 98.1 F (36.7 C)  98.3 F (36.8 C)   TempSrc: Oral  Oral   Resp: 33 32 28 32  Weight:      SpO2: 97% 99% 100% 100%    Wt Readings from Last 3 Encounters:  06/08/15 80.1 kg (176 lb 9.4 oz)  06/06/15 79.107 kg (174 lb 6.4 oz)  05/16/15  86.183 kg (190 lb)     Intake/Output Summary (Last 24 hours) at 06/09/15 1110 Last data filed at 06/09/15 0800  Gross per 24 hour  Intake 1877.33 ml  Output   1000 ml  Net 877.33 ml    Exam  General: Well developed, well nourished, no apparent distress  HEENT: NCAT, mucous membranes moist.   Cardiovascular: S1 S2 auscultated, no rubs, murmurs or gallops. Regular rate and rhythm.  Respiratory: Audible stridor with labored breathing-use of accessory muscles   Abdomen: Soft, nontender, nondistended, + bowel sounds, peg in place  Extremities: warm dry without cyanosis clubbing or edema  Neuro: AAOx2, follows some commands, aphasic, right sided hemiparesis  Data Review   Micro Results Recent Results (from the past 240 hour(s))  Culture, blood (x 2)     Status: None (Preliminary result)   Collection Time: 06/07/15  8:19 AM  Result Value Ref Range Status   Specimen Description BLOOD RIGHT HAND  Final   Special Requests BOTTLES DRAWN AEROBIC  ONLY 4ML  Final   Culture NO GROWTH 1 DAY  Final   Report Status PENDING  Incomplete  Culture, blood (x 2)     Status: None (Preliminary result)   Collection Time: 06/07/15  8:25 AM  Result Value Ref Range Status   Specimen Description BLOOD RIGHT HAND  Final   Special Requests BOTTLES DRAWN AEROBIC ONLY 1ML  Final   Culture NO GROWTH 1 DAY  Final   Report Status PENDING  Incomplete  MRSA PCR Screening     Status: None   Collection Time: 06/08/15  2:14 PM  Result Value Ref Range Status   MRSA by PCR NEGATIVE NEGATIVE Final    Comment:        The GeneXpert MRSA Assay (FDA approved for NASAL specimens only), is one component of a comprehensive MRSA colonization surveillance program. It is not intended to diagnose MRSA infection nor to guide or monitor treatment for MRSA infections.     Radiology Reports Dg Chest 2 View  06/05/2015  CLINICAL DATA:  Shortness of breath with exertion, personal history of breast cancer, patient smokes EXAM: CHEST  2 VIEW COMPARISON:  05/14/2015 FINDINGS: Stable mild cardiac enlargement. Vascular pattern normal. No consolidation or effusion. IMPRESSION: No active cardiopulmonary disease. Electronically Signed   By: Skipper Cliche M.D.   On: 06/05/2015 10:39   Ct Head Wo Contrast  06/09/2015  CLINICAL DATA:  Initial evaluation for deteriorating mental status. EXAM: CT HEAD WITHOUT CONTRAST TECHNIQUE: Contiguous axial images were obtained from the base of the skull through the vertex without intravenous contrast. COMPARISON:  Prior study from 04/16/2015. FINDINGS: There has been continued interval evolution of large volume left MCA territory infarct, overall stable in size and distribution as compared to previous study. There is increased volume loss and encephalomalacia within the area of infarction. No evidence for associated hemorrhage. Previously seen midline shift has essentially resolved. No new acute large vessel territory infarct. No intracranial  hemorrhage. No mass lesion. No hydrocephalus. Basilar cisterns are patent. No extra-axial fluid collection. Mild chronic microvascular ischemic disease noted within the periventricular white matter. Scattered atheromatous plaque within the carotid siphons. Scalp soft tissues within normal limits. No acute abnormality about the orbits. Paranasal sinuses are clear.  No mastoid effusion. Calvarium intact. IMPRESSION: 1. Normal expected interval evolutional change of large left MCA territory infarct. Associated edema has largely resolved. 2. No new intracranial process. Electronically Signed   By: Jeannine Boga M.D.   On: 06/09/2015  00:47   Ct Angio Chest Pe W/cm &/or Wo Cm  06/07/2015  CLINICAL DATA:  Tachycardia. EKG with sinus tachycardia and T-wave abnormality. EXAM: CT ANGIOGRAPHY CHEST WITH CONTRAST TECHNIQUE: Multidetector CT imaging of the chest was performed using the standard protocol during bolus administration of intravenous contrast. Multiplanar CT image reconstructions and MIPs were obtained to evaluate the vascular anatomy. CONTRAST:  80 mL OMNIPAQUE IOHEXOL 350 MG/ML SOLN COMPARISON:  04/20/2015 FINDINGS: Technically adequate study with good opacification of the central and segmental pulmonary arteries. Filling defects are demonstrated in the right lower lobe posterior subsegmental branch vessels consistent with pulmonary embolus. This may be acute or chronic. No large central pulmonary emboli. Possible filling defect in the left atrial appendage. Cardiac enlargement with evidence of left ventricular hypertrophy. R 8 RV ratio is less than 1 suggesting no evidence of right heart strain. Small pericardial effusion or thickening. Esophagus is decompressed. No significant lymphadenopathy in the chest. Evaluation of lungs is limited due to respiratory motion artifact. Probable atelectasis in the lung bases. Emphysematous changes in the upper lungs. No pleural effusions. No pneumothorax. Included  portions of the upper abdominal organs demonstrate calcified granulomas in the spleen. Degenerative changes in the spine. Review of the MIP images confirms the above findings. IMPRESSION: Subsegmental right lower lobe pulmonary emboli. No evidence of right heart strain. Possible filling defect in the left atrial appendage. Cardiac enlargement with left ventricular hypertrophy. No evidence of active pulmonary disease. These results were called by telephone at the time of interpretation on 06/07/2015 at 12:44 am to [facility], the patient's nurse on MC-4w, who verbally acknowledged these results. Electronically Signed   By: Lucienne Capers M.D.   On: 06/07/2015 00:47   Dg Chest Port 1 View  05/14/2015  CLINICAL DATA:  Respiratory failure. Tracheostomy. Status post decannulation. EXAM: PORTABLE CHEST 1 VIEW COMPARISON:  04/27/2015 FINDINGS: Tracheostomy tube and right arm PICC line have been removed. The patient is partially rotated to the right. Both lungs are clear. No evidence of pneumothorax or pleural effusion. Heart size is stable. IMPRESSION: No active disease. Electronically Signed   By: Earle Gell M.D.   On: 05/14/2015 13:53   Dg Abd Portable 1v  06/04/2015  CLINICAL DATA:  Hematemesis. EXAM: PORTABLE ABDOMEN - 1 VIEW COMPARISON:  04/29/2015 FINDINGS: No evidence of bowel obstruction or ileus. Tube overlying the right abdomen appears to represent a gastrostomy tube. The region of the stomach is cut off at the top of the film. IMPRESSION: No evidence of bowel obstruction or ileus. Electronically Signed   By: Aletta Edouard M.D.   On: 06/04/2015 17:22   Dg Swallowing Func-speech Pathology  06/06/2015  Objective Swallowing Evaluation:   Patient Details Name: RODOLFO YOAKAM MRN: [id number] Date of Birth: [birth year] Today's Date: 06/06/2015 Time: SLP Start Time (ACUTE ONLY): 0902-SLP Stop Time (ACUTE ONLY): 0930 SLP Time Calculation (min) (ACUTE ONLY): 28 min Past Medical History: Past Medical History  Diagnosis Date . Hypertension  . Coronary artery disease  . Diabetes mellitus  . Breast cancer ([place]) 03/10/2007   Left breat . Degenerative disc disease, cervical  . Degenerative disc disease, lumbar  . Fibromyalgia  . Hyperlipidemia  . Carpal tunnel syndrome on right  Past Surgical History: Past Surgical History Procedure Laterality Date . Cardiac surgery   . Appendectomy   . Breast surgery   . Mastectomy Left 03/2007 . Cardiac catheterization  2005 . Vein surgery Right 2005 . Tubal ligation Right  . Radiology with anesthesia N/A 04/11/2015  Procedure: RADIOLOGY WITH ANESTHESIA;  Surgeon: Luanne Bras, MD;  Location: [place];  Service: Radiology;  Laterality: N/A; . Cardiac catheterization N/A 04/13/2015   Procedure: Temporary Pacemaker;  Surgeon: Peter M Martinique, MD;  Location: [place] [facility];  Service: Cardiovascular;  Laterality: N/A; . Peg placement N/A 04/18/2015   Procedure: PERCUTANEOUS ENDOSCOPIC GASTROSTOMY (PEG) PLACEMENT;  Surgeon: Judeth Horn, MD;  Location: [place];  Service: General;  Laterality: N/A;  bedside . Esophagogastroduodenoscopy N/A 04/18/2015   Procedure: ESOPHAGOGASTRODUODENOSCOPY (EGD);  Surgeon: Judeth Horn, MD;  Location: [facility] ENDOSCOPY;  Service: General;  Laterality: N/A; HPI: HUMAIRA COMMER is a 59 y.o. female who was admitted on 04/11/15 with difficulty speaking, right facial and right sided weakness. CT showed dense L-MCA sign and TPA started but she had worsening of right hemiparesis with lethargy. Post procedure developed acute respiratory failure with dense right hemiparesis with global aphasia. She was vent dependent and developed small patchy areas of hemorrhage within acute Large L-MCA infarct as well as cerebral edema. She continued to have lethargy with decrease in LOC requiring PEG placement by Dr. Hulen Skains and Lurline Idol with BAL performed by Dr Nelda Marseille.  She was weaned to trach collar but continued to have significant secretions requiring frequent suctioning.  She  was discharged to [facility] on 11/02 for medical management and rehab. She was decannulated on 11/21.  MBS done 11/14 and she was started on dysphagia 1, honey liquids on 11/21 and tube feeds ongoing as supplement. She was felt to be a good candidate for intensive rehab program and CIR was recommended for follow up therapy.  Repeat objective swallow study ordered today to determine readiness for diet progression.   No Data Recorded Assessment / Plan / Recommendation CHL IP CLINICAL IMPRESSIONS 06/06/2015 Therapy Diagnosis Mild oral phase dysphagia;Moderate pharyngeal phase dysphagia Clinical Impression Pt presents with improvements in swallowing function in comparison to initial MBS.  Pt now presents with a mild oral phase dysphagia and moderate pharyngeal phase dysphagia with both sensory and motor components.  Oral phase deficits are characterized by right sided labial and lingual weakness which results in weakened manipulation and prolonged oral transit of boluses to the oropharynx.  Decreased pharyngeal sensation and decreased base of tongue retraction also resulted in premature spillage of materials into the pharynx and delayed swallow initiation.  Swallow response was delayed to the vallecula with thickened liquids and solids, and further delayed to the pyriforms with thin liquids.  Delayed swallow initiation resulted in deep, silent penetration of thin liquids.    Pharyngeal weakness also resulted in mild-moderate residuals remaining in the vallecula post swallow.  Vallecular residuals were noted to spill over the epiglottis into the laryngeal vestibule and resulted in high flash penetration of solids during the swallow as well as trace aspiration after the swallow during consumption of thin liquids.   Pt was noted to spontaneously use extra swallows which, in combination with SLP interventions for alternating solids and liquids effectively minimized the amount of residue in the pharynx.  Recommend diet progression  to dys 2 textures and nectar thick liquids with full supervision for use of swallowing precautions.  Pt would also benefit from the water protocol to continue working towards further liquids progression.  Prognosis for advancement good with trials of advanced consistencies, SLP follow up for use of safe swallowing strategies, and repeat objective swallow study in 1-2 weeks.   Impact on safety and function Risk for inadequate nutrition/hydration;Moderate aspiration risk   No flowsheet data found.  CHL  IP DIET RECOMMENDATION 06/06/2015 SLP Diet Recommendations Dysphagia 2 (Fine chop) solids;Nectar thick liquid Liquid Administration via Cup Medication Administration Other (Comment) Compensations Minimize environmental distractions;Slow rate;Small sips/bites;Multiple dry swallows after each bite/sip;Follow solids with liquid Postural Changes --   No flowsheet data found.         CHL IP ORAL PHASE 06/06/2015 Oral Phase Impaired Oral - Pudding Teaspoon -- Oral - Pudding Cup -- Oral - Honey Teaspoon -- Oral - Honey Cup Lingual/palatal residue;Decreased bolus cohesion;Weak lingual manipulation;Reduced posterior propulsion;Delayed oral transit;Premature spillage Oral - Nectar Teaspoon -- Oral - Nectar Cup Weak lingual manipulation;Reduced posterior propulsion;Premature spillage;Delayed oral transit;Decreased bolus cohesion;Lingual/palatal residue Oral - Nectar Straw -- Oral - Thin Teaspoon -- Oral - Thin Cup Weak lingual manipulation;Reduced posterior propulsion;Premature spillage;Delayed oral transit;Decreased bolus cohesion;Lingual/palatal residue Oral - Thin Straw Premature spillage;Lingual/palatal residue;Decreased bolus cohesion Oral - Puree Weak lingual manipulation;Reduced posterior propulsion;Lingual/palatal residue;Premature spillage;Delayed oral transit Oral - Mech Soft -- Oral - Regular Weak lingual manipulation;Reduced posterior propulsion;Lingual/palatal residue;Premature spillage Oral - Multi-Consistency --  Oral - Pill -- Oral Phase - Comment --  CHL IP PHARYNGEAL PHASE 06/06/2015 Pharyngeal Phase Impaired Pharyngeal- Pudding Teaspoon -- Pharyngeal -- Pharyngeal- Pudding Cup -- Pharyngeal -- Pharyngeal- Honey Teaspoon -- Pharyngeal -- Pharyngeal- Honey Cup Delayed swallow initiation-vallecula;Reduced tongue base retraction;Reduced laryngeal elevation;Reduced anterior laryngeal mobility;Pharyngeal residue - valleculae Pharyngeal -- Pharyngeal- Nectar Teaspoon -- Pharyngeal -- Pharyngeal- Nectar Cup Delayed swallow initiation-vallecula;Reduced tongue base retraction;Reduced anterior laryngeal mobility;Reduced laryngeal elevation;Pharyngeal residue - valleculae Pharyngeal -- Pharyngeal- Nectar Straw -- Pharyngeal -- Pharyngeal- Thin Teaspoon -- Pharyngeal -- Pharyngeal- Thin Cup Reduced airway/laryngeal closure;Reduced tongue base retraction;Reduced anterior laryngeal mobility;Reduced laryngeal elevation;Pharyngeal residue - valleculae;Pharyngeal residue - pyriform;Penetration/Aspiration during swallow;Penetration/Apiration after swallow;Delayed swallow initiation-pyriform sinuses Pharyngeal Material enters airway, CONTACTS cords and not ejected out;Material enters airway, passes BELOW cords without attempt by patient to eject out (silent aspiration) Pharyngeal- Thin Straw Compensatory strategies attempted (with notebox);Penetration/Aspiration during swallow;Reduced airway/laryngeal closure;Reduced anterior laryngeal mobility;Reduced laryngeal elevation;Reduced tongue base retraction;Pharyngeal residue - pyriform;Pharyngeal residue - valleculae Pharyngeal Material enters airway, CONTACTS cords and then ejected out Pharyngeal- Puree Delayed swallow initiation-vallecula;Reduced tongue base retraction;Pharyngeal residue - valleculae;Reduced anterior laryngeal mobility Pharyngeal -- Pharyngeal- Mechanical Soft -- Pharyngeal -- Pharyngeal- Regular Delayed swallow initiation-vallecula;Reduced anterior laryngeal mobility;Reduced  tongue base retraction;Pharyngeal residue - valleculae Pharyngeal -- Pharyngeal- Multi-consistency -- Pharyngeal -- Pharyngeal- Pill -- Pharyngeal -- Pharyngeal Comment --  No flowsheet data found. Page, Selinda Orion 06/06/2015, 12:37 PM               CBC  Recent Labs Lab 06/06/15 1928 06/07/15 0836  06/07/15 1836 06/07/15 2321 06/08/15 0559 06/08/15 1234 06/09/15 0828  WBC 15.4* 10.0  < > 7.4 9.3 11.7* 13.6* 7.7  HGB 13.6 11.6*  < > 11.6* 11.7* 11.6* 12.2 12.3  HCT 42.4 37.4  < > 37.5 36.0 35.9* 37.3 37.3  PLT 354 250  < > 221 228 229 257 243  MCV 86.9 89.3  < > 88.4 87.6 88.2 86.9 87.4  MCH 27.9 27.7  < > 27.4 28.5 28.5 28.4 28.8  MCHC 32.1 31.0  < > 30.9 32.5 32.3 32.7 33.0  RDW 14.6 14.9  < > 14.6 14.5 14.5 14.6 14.7  LYMPHSABS 3.9 3.3  --   --   --   --   --   --   MONOABS 1.0 0.7  --   --   --   --   --   --   EOSABS 0.0 0.1  --   --   --   --   --   --  BASOSABS 0.0 0.0  --   --   --   --   --   --   < > = values in this interval not displayed.  Chemistries   Recent Labs Lab 06/05/15 0335 06/06/15 1928 06/07/15 0836 06/08/15 0559 06/09/15 0321  NA 138 138 140 139 138  K 3.5 3.1* 2.8* 3.8 5.9*  CL 99* 97* 105 106 103  CO2 29 29 27 25 26   GLUCOSE 88 171* 129* 117* 149*  BUN 8 18 18 6 6   CREATININE 0.46 0.53 0.48 0.44 0.38*  CALCIUM 9.3 9.3 8.6* 8.4* 8.4*  MG  --   --   --   --  1.7  AST  --  20 19 20   --   ALT  --  25 21 18   --   ALKPHOS  --  74 62 66  --   BILITOT  --  0.4 0.3 0.5  --    ------------------------------------------------------------------------------------------------------------------ estimated creatinine clearance is 82.9 mL/min (by C-G formula based on Cr of 0.38). ------------------------------------------------------------------------------------------------------------------ No results for input(s): HGBA1C in the last 72  hours. ------------------------------------------------------------------------------------------------------------------ No results for input(s): CHOL, HDL, LDLCALC, TRIG, CHOLHDL, LDLDIRECT in the last 72 hours. ------------------------------------------------------------------------------------------------------------------ No results for input(s): TSH, T4TOTAL, T3FREE, THYROIDAB in the last 72 hours.  Invalid input(s): FREET3 ------------------------------------------------------------------------------------------------------------------ No results for input(s): VITAMINB12, FOLATE, FERRITIN, TIBC, IRON, RETICCTPCT in the last 72 hours.  Coagulation profile  Recent Labs Lab 06/07/15 0836  INR 1.32     Recent Labs  06/06/15 1928  DDIMER 0.58*    Cardiac Enzymes  Recent Labs Lab 06/07/15 0443 06/07/15 1216 06/07/15 1836  TROPONINI 0.22* 0.18* 0.19*   ------------------------------------------------------------------------------------------------------------------ Invalid input(s): POCBNP    Kenzley Ke D.O. on 06/09/2015 at 11:10 AM  Between 7am to 7pm - Pager - [phone]  After 7pm go to [email or web address] - password [email or web address]  And look for the night coverage person covering for me after hours  [facility]  [phone]

## 2015-06-09 NOTE — Plan of Care (Signed)
Assessed patient at bedside for change in mental status and "belly breathing". She was quick to arouse which is an improvement per RN who is providing me with information regarding the patient. She is attempting to speak to me.   Per report, her RR has been elevated since she was at rehab. Will continue to monitor.  Randa [name], MD [facility] [place]

## 2015-06-10 DIAGNOSIS — Z452 Encounter for adjustment and management of vascular access device: Secondary | ICD-10-CM

## 2015-06-10 DIAGNOSIS — Z853 Personal history of malignant neoplasm of breast: Secondary | ICD-10-CM

## 2015-06-10 LAB — CBC
HEMATOCRIT: 35.1 % — AB (ref 36.0–46.0)
HEMOGLOBIN: 11.4 g/dL — AB (ref 12.0–15.0)
MCH: 28.8 pg (ref 26.0–34.0)
MCHC: 32.5 g/dL (ref 30.0–36.0)
MCV: 88.6 fL (ref 78.0–100.0)
Platelets: 240 10*3/uL (ref 150–400)
RBC: 3.96 MIL/uL (ref 3.87–5.11)
RDW: 14.7 % (ref 11.5–15.5)
WBC: 10.6 10*3/uL — AB (ref 4.0–10.5)

## 2015-06-10 LAB — BASIC METABOLIC PANEL
ANION GAP: 9 (ref 5–15)
BUN: 9 mg/dL (ref 6–20)
CHLORIDE: 99 mmol/L — AB (ref 101–111)
CO2: 31 mmol/L (ref 22–32)
Calcium: 8.8 mg/dL — ABNORMAL LOW (ref 8.9–10.3)
Creatinine, Ser: 0.31 mg/dL — ABNORMAL LOW (ref 0.44–1.00)
GFR calc Af Amer: >60 mL/min (ref >60)
GFR calc non Af Amer: >60 mL/min (ref >60)
GLUCOSE: 167 mg/dL — AB (ref 65–99)
POTASSIUM: 3.1 mmol/L — AB (ref 3.5–5.1)
Sodium: 139 mmol/L (ref 135–145)

## 2015-06-10 LAB — GLUCOSE, CAPILLARY
GLUCOSE-CAPILLARY: 159 mg/dL — AB (ref 65–99)
GLUCOSE-CAPILLARY: 187 mg/dL — AB (ref 65–99)
GLUCOSE-CAPILLARY: 169 mg/dL — AB (ref 65–99)
Glucose-Capillary: 175 mg/dL — ABNORMAL HIGH (ref 65–99)
Glucose-Capillary: 209 mg/dL — ABNORMAL HIGH (ref 65–99)

## 2015-06-10 MED ORDER — POTASSIUM CHLORIDE CRYS ER 20 MEQ PO TBCR
40.0000 meq | EXTENDED_RELEASE_TABLET | Freq: Once | ORAL | Status: AC
  Administered 2015-06-10: 40 meq via ORAL
  Filled 2015-06-10: qty 2

## 2015-06-10 NOTE — Discharge Instructions (Signed)
Information on my medicine - XARELTO (rivaroxaban)  This medication education was reviewed with me or my healthcare representative as part of my discharge preparation.   WHY WAS XARELTO PRESCRIBED FOR YOU? 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  What do you need to know about Xarelto? The starting dose is one 15 mg tablet taken TWICE daily with food for the FIRST 21 DAYS then on 06/30/2015  the dose is changed to one 20 mg tablet taken ONCE A DAY with your evening meal.  DO NOT stop taking Xarelto without talking to the health care provider who prescribed the medication.  Refill your prescription for 20 mg tablets before you run out.  After discharge, you should have regular check-up appointments with your healthcare provider that is prescribing your Xarelto.  In the future your dose may need to be changed if your kidney function changes by a significant amount.  What do you do if you miss a dose? If you are taking Xarelto TWICE DAILY and you miss a dose, take it as soon as you remember. You may take two 15 mg tablets (total 30 mg) at the same time then resume your regularly scheduled 15 mg twice daily the next day.  If you are taking Xarelto ONCE DAILY and you miss a dose, take it as soon as you remember on the same day then continue your regularly scheduled once daily regimen the next day. Do not take two doses of Xarelto at the same time.   Important Safety Information Xarelto is a blood thinner medicine that can cause bleeding. You should call your healthcare provider right away if you experience any of the following: ? Bleeding from an injury or your nose that does not stop. ? Unusual colored urine (red or dark brown) or unusual colored stools (red or black). ? Unusual bruising for unknown reasons. ? A serious fall or  if you hit your head (even if there is no bleeding).  Some medicines may interact with Xarelto and might increase your risk of bleeding while on Xarelto. To help avoid this, consult your healthcare provider or pharmacist prior to using any new prescription or non-prescription medications, including herbals, vitamins, non-steroidal anti-inflammatory drugs (NSAIDs) and supplements.  This website has more information on Xarelto: [email or web address]

## 2015-06-10 NOTE — Progress Notes (Addendum)
[facility]                                                                              Patient Demographics  Kerry [name], is a 59 y.o. female, DOB - 24-Aug-1955, YE:[id number]  Admit date - 06/07/2015   Admitting Physician Ivor Costa, MD  Outpatient Primary MD for the patient is [name], MD  LOS - 3   No chief complaint on file.       HPI on 06/07/2015 by Dr. Ivor Costa Kerry [name] is a 59 y.o. female with PMH of hypertension, hyperlipidemia, diabetes mellitus, GERD, CAD, breast cancer (s/P left mastectomy), DDD, carpal tunnel syndrome, recent large left MCA territory infarct with resolving associated hemorrhage after tPA treatment, s/p of PEG placement, right-sided hemiparalysis, currently doing rehab in [place] since 11/23.   Today pt developed tachycardia and hypotension. Cardiology was consulted, Dr. [name] suspected that patient may have PE, which is confirmed by CTA tonight. PCCM was consulted and recommended to start IV heparin. We are asked to admit pt. Pt has aphagia, but can tell that she has shortness of breath and mild chest pain. She does not seem to have abdominal pain or diarrhea. She is currently being treated for UTI with ancef since 12/13. CTA showed subsegmental right lower lobe pulmonary emboli. No evidence of right heart strain. Possible filling defect in the left atrial appendage.  Patient was found to have hypotension with blood pressure 82/46, potassium 3.1, troponin 0.33, lactate 1.1, tachycardia, tachypnea, temperature 99.8, WBC 15.4. Patient is admitted to stepdown bed for further eval and treatment.   Interim history Patient admitted from CIR for PE/DVT, was started on heparin and transition to [place]. Patient continues to have the stridor type of cream. Patient is currently being followed by pulmonary. ENT did see patient and recommended outpatient follow-up.  PT recommended HH.  Assessment & Plan  Acute pulmonary  embolism/DVT -As seen on CTA chest -Patient did have transient hypotension which was thought to be likely from sepsis rather than from pulmonary embolism -PCCM was consulted and appreciated, recommended heparin drip -Echocardiogram: EF 0000000, grade 1 diastolic dysfunction -Troponin currently trending downward, was 0.46 currently 0.19 -Lower extremity Doppler: Right focal, acute nonocclusive DVT noted in CFV -Spoke with neurology, Dr. [name], regarding [facility] as patient recently received tPA for CVA- states it is fine to start a new agent and Plavix can be discontinued. 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  Recommended humidified nasal cannula oxygen to prevent epistaxis, consider outpatient (larygnologist at [facility] center) referral for laser arytenoidectomy/partial cordotomy once her current issues improved -Patient currently maintaining her O2 sats  Sepsis secondary to Urinary tract infection -Patient was noted to have leukocytosis, tachycardia, tachypnea,  hypotension and febrile -Patient is being treated with Ancef -Continue IV fluids and antibiotics -Urine culture showed pansensitive Escherichia coli  Coffee-ground emesis -Gastroenterology was consulted, Dr. Michail Sermon suspected the epistaxis was the cause. EGD was not indicated at that time. It was okay to resume Plavix on 1214 -Continue  Protonix -Hemoglobin has remained stable, currently 11.4 -Continue to monitor  CVA -Status post TPA and embolectomy by interventional radiology -Patient was admitted to inpatient rehabilitation -Patient has right-sided paresis as well as aphasia -Status post PEG placement -Continue statin, xarelto  Altered level of consciousness -Resolved -Overnight 06/08/2015, patient became somewhat unresponsive. -CT head: Normocephalic interval evolution change of the large left MCA, associated edema has largely resolved. No new intracranial process  Hyperlipidemia -Last LDL on 04/12/2015 was 133 -Continue statin  Essential hypertension -Patient was hypotensive which improved with fluid bolus -Continue to monitor and hold antihypertensive medications  Hypokalemia/Hyperkalemia -Potassium 3.1 this morning -Will replace -Continue to monitor BMP  Leukocytosis -Possibly secondary to steroids -Continue to monitor CBC -Patient is being treated for UTI  Code Status: Full  Family Communication: None at bedside.   Disposition Plan: Admitted.  Continue to monitor closely in step down. Suspect possible discharge within the next 24-48 hours  Time Spent in minutes 30 minutes  Procedures  Lower extremity Doppler Laryngoscopy  Consults  Cardiology PCCM ENT Neurology, Dr. [name], via phone  DVT Prophylaxis Heparin  Lab Results  Component Value Date   PLT 240 06/10/2015    Medications  Scheduled Meds: . antiseptic oral rinse  7 mL Mouth Rinse q12n4p  . atorvastatin  40 mg Oral q1800  . bacitracin   Topical TID  . carvedilol  12.5 mg Oral BID WC  .  ceFAZolin (ANCEF) IV  1 g Intravenous 3 times per day  . chlorhexidine  15 mL Mouth Rinse BID  . feeding supplement (PRO-STAT SUGAR FREE 64)  30 mL Oral Daily  . feeding supplement (VITAL AF 1.2 CAL)  237 mL Per Tube TID  . folic acid  1 mg Oral Daily  . free water  200 mL Per Tube 3 times per day  . lisinopril  10 mg Oral  Daily  . multivitamin with minerals  1 tablet Oral Daily  . nicotine  7 mg Transdermal Daily  . oxymetazoline  2 spray Each Nare 3 times per day  . rivaroxaban  15 mg Oral BID WC   Followed by  . [START ON 06/30/2015] rivaroxaban  20 mg Oral Q supper  . sodium chloride  3 mL Intravenous Q12H  . thiamine  100 mg Oral Daily   Continuous Infusions:   PRN Meds:.acetaminophen, bisacodyl, hydrALAZINE, ipratropium-albuterol, nitroGLYCERIN, senna-docusate  Antibiotics    Anti-infectives    Start     Dose/Rate Route Frequency Ordered Stop   06/07/15 1400  ceFAZolin (ANCEF) IVPB 1 g/50 mL premix     1 g 100 mL/hr over 30 Minutes Intravenous 3 times per day 06/07/15 1258     06/07/15 0600  ceFAZolin (ANCEF) IVPB 1 g/50 mL premix  Status:  Discontinued     1 g 100 mL/hr over 30 Minutes Intravenous 3 times per day 06/07/15 0551 06/07/15 [id number]      Subjective:   Mixtli Towles seen and examined today.  Patient has aphasia but is able to say a few words.   Objective:   Filed Vitals:   06/10/15 0600 06/10/15 0747 06/10/15 0800 06/10/15 1000  BP: 85/56 114/74 134/79 131/67  Pulse: 92 94 96  86  Temp:  97.9 F (36.6 C)    TempSrc:  Oral    Resp: 27 28 26 27   Weight:      SpO2: 99% 100% 100% 100%    Wt Readings from Last 3 Encounters:  06/08/15 80.1 kg (176 lb 9.4 oz)  06/06/15 79.107 kg (174 lb 6.4 oz)  05/16/15 86.183 kg (190 lb)     Intake/Output Summary (Last 24 hours) at 06/10/15 1101 Last data filed at 06/10/15 1000  Gross per 24 hour  Intake   1898 ml  Output   1176 ml  Net    722 ml    Exam  General: Well developed, well nourished, no apparent distress  HEENT: NCAT, mucous membranes moist.   Cardiovascular: S1 S2 auscultated, RRR, no murmurs  Respiratory: Audible stridor with labored breathing-use of accessory muscles   Abdomen: Soft, nontender, nondistended, + bowel sounds, peg in place  Extremities: warm dry without cyanosis clubbing or edema  Neuro:  AAOx2, follows some commands, aphasic, right sided hemiparesis  Data Review   Micro Results Recent Results (from the past 240 hour(s))  Culture, blood (x 2)     Status: None (Preliminary result)   Collection Time: 06/07/15  8:19 AM  Result Value Ref Range Status   Specimen Description BLOOD RIGHT HAND  Final   Special Requests BOTTLES DRAWN AEROBIC ONLY 4ML  Final   Culture NO GROWTH 3 DAYS  Final   Report Status PENDING  Incomplete  Culture, blood (x 2)     Status: None (Preliminary result)   Collection Time: 06/07/15  8:25 AM  Result Value Ref Range Status   Specimen Description BLOOD RIGHT HAND  Final   Special Requests BOTTLES DRAWN AEROBIC ONLY 1ML  Final   Culture NO GROWTH 3 DAYS  Final   Report Status PENDING  Incomplete  MRSA PCR Screening     Status: None   Collection Time: 06/08/15  2:14 PM  Result Value Ref Range Status   MRSA by PCR NEGATIVE NEGATIVE Final    Comment:        The GeneXpert MRSA Assay (FDA approved for NASAL specimens only), is one component of a comprehensive MRSA colonization surveillance program. It is not intended to diagnose MRSA infection nor to guide or monitor treatment for MRSA infections.     Radiology Reports Dg Chest 2 View  06/05/2015  CLINICAL DATA:  Shortness of breath with exertion, personal history of breast cancer, patient smokes EXAM: CHEST  2 VIEW COMPARISON:  05/14/2015 FINDINGS: Stable mild cardiac enlargement. Vascular pattern normal. No consolidation or effusion. IMPRESSION: No active cardiopulmonary disease. Electronically Signed   By: Skipper Cliche M.D.   On: 06/05/2015 10:39   Ct Head Wo Contrast  06/09/2015  CLINICAL DATA:  Initial evaluation for deteriorating mental status. EXAM: CT HEAD WITHOUT CONTRAST TECHNIQUE: Contiguous axial images were obtained from the base of the skull through the vertex without intravenous contrast. COMPARISON:  Prior study from 04/16/2015. FINDINGS: There has been continued interval  evolution of large volume left MCA territory infarct, overall stable in size and distribution as compared to previous study. There is increased volume loss and encephalomalacia within the area of infarction. No evidence for associated hemorrhage. Previously seen midline shift has essentially resolved. No new acute large vessel territory infarct. No intracranial hemorrhage. No mass lesion. No hydrocephalus. Basilar cisterns are patent. No extra-axial fluid collection. Mild chronic microvascular ischemic disease noted within the periventricular white matter. Scattered atheromatous plaque within the  carotid siphons. Scalp soft tissues within normal limits. No acute abnormality about the orbits. Paranasal sinuses are clear.  No mastoid effusion. Calvarium intact. IMPRESSION: 1. Normal expected interval evolutional change of large left MCA territory infarct. Associated edema has largely resolved. 2. No new intracranial process. Electronically Signed   By: Jeannine Boga M.D.   On: 06/09/2015 00:47   Ct Angio Chest Pe W/cm &/or Wo Cm  06/07/2015  CLINICAL DATA:  Tachycardia. EKG with sinus tachycardia and T-wave abnormality. EXAM: CT ANGIOGRAPHY CHEST WITH CONTRAST TECHNIQUE: Multidetector CT imaging of the chest was performed using the standard protocol during bolus administration of intravenous contrast. Multiplanar CT image reconstructions and MIPs were obtained to evaluate the vascular anatomy. CONTRAST:  80 mL OMNIPAQUE IOHEXOL 350 MG/ML SOLN COMPARISON:  04/20/2015 FINDINGS: Technically adequate study with good opacification of the central and segmental pulmonary arteries. Filling defects are demonstrated in the right lower lobe posterior subsegmental branch vessels consistent with pulmonary embolus. This may be acute or chronic. No large central pulmonary emboli. Possible filling defect in the left atrial appendage. Cardiac enlargement with evidence of left ventricular hypertrophy. R 8 RV ratio is less  than 1 suggesting no evidence of right heart strain. Small pericardial effusion or thickening. Esophagus is decompressed. No significant lymphadenopathy in the chest. Evaluation of lungs is limited due to respiratory motion artifact. Probable atelectasis in the lung bases. Emphysematous changes in the upper lungs. No pleural effusions. No pneumothorax. Included portions of the upper abdominal organs demonstrate calcified granulomas in the spleen. Degenerative changes in the spine. Review of the MIP images confirms the above findings. IMPRESSION: Subsegmental right lower lobe pulmonary emboli. No evidence of right heart strain. Possible filling defect in the left atrial appendage. Cardiac enlargement with left ventricular hypertrophy. No evidence of active pulmonary disease. These results were called by telephone at the time of interpretation on 06/07/2015 at 12:44 am to [facility], the patient's nurse on MC-4w, who verbally acknowledged these results. Electronically Signed   By: Lucienne Capers M.D.   On: 06/07/2015 00:47   Dg Chest Port 1 View  05/14/2015  CLINICAL DATA:  Respiratory failure. Tracheostomy. Status post decannulation. EXAM: PORTABLE CHEST 1 VIEW COMPARISON:  04/27/2015 FINDINGS: Tracheostomy tube and right arm PICC line have been removed. The patient is partially rotated to the right. Both lungs are clear. No evidence of pneumothorax or pleural effusion. Heart size is stable. IMPRESSION: No active disease. Electronically Signed   By: Earle Gell M.D.   On: 05/14/2015 13:53   Dg Abd Portable 1v  06/04/2015  CLINICAL DATA:  Hematemesis. EXAM: PORTABLE ABDOMEN - 1 VIEW COMPARISON:  04/29/2015 FINDINGS: No evidence of bowel obstruction or ileus. Tube overlying the right abdomen appears to represent a gastrostomy tube. The region of the stomach is cut off at the top of the film. IMPRESSION: No evidence of bowel obstruction or ileus. Electronically Signed   By: Aletta Edouard M.D.   On: 06/04/2015  17:22   Dg Swallowing Func-speech Pathology  06/06/2015  Objective Swallowing Evaluation:   Patient Details Name: TAISHA KNUCKLES MRN: [id number] Date of Birth: [birth year] Today's Date: 06/06/2015 Time: SLP Start Time (ACUTE ONLY): 0902-SLP Stop Time (ACUTE ONLY): 0930 SLP Time Calculation (min) (ACUTE ONLY): 28 min Past Medical History: Past Medical History Diagnosis Date . Hypertension  . Coronary artery disease  . Diabetes mellitus  . Breast cancer ([place]) 03/10/2007   Left breat . Degenerative disc disease, cervical  . Degenerative disc disease, lumbar  .  Fibromyalgia  . Hyperlipidemia  . Carpal tunnel syndrome on right  Past Surgical History: Past Surgical History Procedure Laterality Date . Cardiac surgery   . Appendectomy   . Breast surgery   . Mastectomy Left 03/2007 . Cardiac catheterization  2005 . Vein surgery Right 2005 . Tubal ligation Right  . Radiology with anesthesia N/A 04/11/2015   Procedure: RADIOLOGY WITH ANESTHESIA;  Surgeon: Luanne Bras, MD;  Location: [facility];  Service: Radiology;  Laterality: N/A; . Cardiac catheterization N/A 04/13/2015   Procedure: Temporary Pacemaker;  Surgeon: Peter M Martinique, MD;  Location: [place] [facility];  Service: Cardiovascular;  Laterality: N/A; . Peg placement N/A 04/18/2015   Procedure: PERCUTANEOUS ENDOSCOPIC GASTROSTOMY (PEG) PLACEMENT;  Surgeon: Judeth Horn, MD;  Location: [facility];  Service: General;  Laterality: N/A;  bedside . Esophagogastroduodenoscopy N/A 04/18/2015   Procedure: ESOPHAGOGASTRODUODENOSCOPY (EGD);  Surgeon: Judeth Horn, MD;  Location: [facility] ENDOSCOPY;  Service: General;  Laterality: N/A; HPI: ROLAND BLADOW is a 59 y.o. female who was admitted on 04/11/15 with difficulty speaking, right facial and right sided weakness. CT showed dense L-MCA sign and TPA started but she had worsening of right hemiparesis with lethargy. Post procedure developed acute respiratory failure with dense right hemiparesis with global aphasia. She was  vent dependent and developed small patchy areas of hemorrhage within acute Large L-MCA infarct as well as cerebral edema. She continued to have lethargy with decrease in LOC requiring PEG placement by Dr. Hulen Skains and Lurline Idol with BAL performed by Dr Nelda Marseille.  She was weaned to trach collar but continued to have significant secretions requiring frequent suctioning.  She was discharged to [facility] on 11/02 for medical management and rehab. She was decannulated on 11/21.  MBS done 11/14 and she was started on dysphagia 1, honey liquids on 11/21 and tube feeds ongoing as supplement. She was felt to be a good candidate for intensive rehab program and CIR was recommended for follow up therapy.  Repeat objective swallow study ordered today to determine readiness for diet progression.   No Data Recorded Assessment / Plan / Recommendation CHL IP CLINICAL IMPRESSIONS 06/06/2015 Therapy Diagnosis Mild oral phase dysphagia;Moderate pharyngeal phase dysphagia Clinical Impression Pt presents with improvements in swallowing function in comparison to initial MBS.  Pt now presents with a mild oral phase dysphagia and moderate pharyngeal phase dysphagia with both sensory and motor components.  Oral phase deficits are characterized by right sided labial and lingual weakness which results in weakened manipulation and prolonged oral transit of boluses to the oropharynx.  Decreased pharyngeal sensation and decreased base of tongue retraction also resulted in premature spillage of materials into the pharynx and delayed swallow initiation.  Swallow response was delayed to the vallecula with thickened liquids and solids, and further delayed to the pyriforms with thin liquids.  Delayed swallow initiation resulted in deep, silent penetration of thin liquids.    Pharyngeal weakness also resulted in mild-moderate residuals remaining in the vallecula post swallow.  Vallecular residuals were noted to spill over the epiglottis into the laryngeal vestibule  and resulted in high flash penetration of solids during the swallow as well as trace aspiration after the swallow during consumption of thin liquids.   Pt was noted to spontaneously use extra swallows which, in combination with SLP interventions for alternating solids and liquids effectively minimized the amount of residue in the pharynx.  Recommend diet progression to dys 2 textures and nectar thick liquids with full supervision for use of swallowing precautions.  Pt  would also benefit from the water protocol to continue working towards further liquids progression.  Prognosis for advancement good with trials of advanced consistencies, SLP follow up for use of safe swallowing strategies, and repeat objective swallow study in 1-2 weeks.   Impact on safety and function Risk for inadequate nutrition/hydration;Moderate aspiration risk   No flowsheet data found.  CHL IP DIET RECOMMENDATION 06/06/2015 SLP Diet Recommendations Dysphagia 2 (Fine chop) solids;Nectar thick liquid Liquid Administration via Cup Medication Administration Other (Comment) Compensations Minimize environmental distractions;Slow rate;Small sips/bites;Multiple dry swallows after each bite/sip;Follow solids with liquid Postural Changes --   No flowsheet data found.         CHL IP ORAL PHASE 06/06/2015 Oral Phase Impaired Oral - Pudding Teaspoon -- Oral - Pudding Cup -- Oral - Honey Teaspoon -- Oral - Honey Cup Lingual/palatal residue;Decreased bolus cohesion;Weak lingual manipulation;Reduced posterior propulsion;Delayed oral transit;Premature spillage Oral - Nectar Teaspoon -- Oral - Nectar Cup Weak lingual manipulation;Reduced posterior propulsion;Premature spillage;Delayed oral transit;Decreased bolus cohesion;Lingual/palatal residue Oral - Nectar Straw -- Oral - Thin Teaspoon -- Oral - Thin Cup Weak lingual manipulation;Reduced posterior propulsion;Premature spillage;Delayed oral transit;Decreased bolus cohesion;Lingual/palatal residue Oral - Thin  Straw Premature spillage;Lingual/palatal residue;Decreased bolus cohesion Oral - Puree Weak lingual manipulation;Reduced posterior propulsion;Lingual/palatal residue;Premature spillage;Delayed oral transit Oral - Mech Soft -- Oral - Regular Weak lingual manipulation;Reduced posterior propulsion;Lingual/palatal residue;Premature spillage Oral - Multi-Consistency -- Oral - Pill -- Oral Phase - Comment --  CHL IP PHARYNGEAL PHASE 06/06/2015 Pharyngeal Phase Impaired Pharyngeal- Pudding Teaspoon -- Pharyngeal -- Pharyngeal- Pudding Cup -- Pharyngeal -- Pharyngeal- Honey Teaspoon -- Pharyngeal -- Pharyngeal- Honey Cup Delayed swallow initiation-vallecula;Reduced tongue base retraction;Reduced laryngeal elevation;Reduced anterior laryngeal mobility;Pharyngeal residue - valleculae Pharyngeal -- Pharyngeal- Nectar Teaspoon -- Pharyngeal -- Pharyngeal- Nectar Cup Delayed swallow initiation-vallecula;Reduced tongue base retraction;Reduced anterior laryngeal mobility;Reduced laryngeal elevation;Pharyngeal residue - valleculae Pharyngeal -- Pharyngeal- Nectar Straw -- Pharyngeal -- Pharyngeal- Thin Teaspoon -- Pharyngeal -- Pharyngeal- Thin Cup Reduced airway/laryngeal closure;Reduced tongue base retraction;Reduced anterior laryngeal mobility;Reduced laryngeal elevation;Pharyngeal residue - valleculae;Pharyngeal residue - pyriform;Penetration/Aspiration during swallow;Penetration/Apiration after swallow;Delayed swallow initiation-pyriform sinuses Pharyngeal Material enters airway, CONTACTS cords and not ejected out;Material enters airway, passes BELOW cords without attempt by patient to eject out (silent aspiration) Pharyngeal- Thin Straw Compensatory strategies attempted (with notebox);Penetration/Aspiration during swallow;Reduced airway/laryngeal closure;Reduced anterior laryngeal mobility;Reduced laryngeal elevation;Reduced tongue base retraction;Pharyngeal residue - pyriform;Pharyngeal residue - valleculae Pharyngeal  Material enters airway, CONTACTS cords and then ejected out Pharyngeal- Puree Delayed swallow initiation-vallecula;Reduced tongue base retraction;Pharyngeal residue - valleculae;Reduced anterior laryngeal mobility Pharyngeal -- Pharyngeal- Mechanical Soft -- Pharyngeal -- Pharyngeal- Regular Delayed swallow initiation-vallecula;Reduced anterior laryngeal mobility;Reduced tongue base retraction;Pharyngeal residue - valleculae Pharyngeal -- Pharyngeal- Multi-consistency -- Pharyngeal -- Pharyngeal- Pill -- Pharyngeal -- Pharyngeal Comment --  No flowsheet data found. Page, Selinda Orion 06/06/2015, 12:37 PM               CBC  Recent Labs Lab 06/06/15 1928 06/07/15 0836  06/07/15 2321 06/08/15 0559 06/08/15 1234 06/09/15 0828 06/10/15 0400  WBC 15.4* 10.0  < > 9.3 11.7* 13.6* 7.7 10.6*  HGB 13.6 11.6*  < > 11.7* 11.6* 12.2 12.3 11.4*  HCT 42.4 37.4  < > 36.0 35.9* 37.3 37.3 35.1*  PLT 354 250  < > 228 229 257 243 240  MCV 86.9 89.3  < > 87.6 88.2 86.9 87.4 88.6  MCH 27.9 27.7  < > 28.5 28.5 28.4 28.8 28.8  MCHC 32.1 31.0  < > 32.5 32.3 32.7 33.0 32.5  RDW 14.6 14.9  < > 14.5 14.5 14.6 14.7 14.7  LYMPHSABS 3.9 3.3  --   --   --   --   --   --   MONOABS 1.0 0.7  --   --   --   --   --   --   EOSABS 0.0 0.1  --   --   --   --   --   --   BASOSABS 0.0 0.0  --   --   --   --   --   --   < > = values in this interval not displayed.  Chemistries   Recent Labs Lab 06/06/15 1928 06/07/15 0836 06/08/15 0559 06/09/15 0321 06/10/15 0400  NA 138 140 139 138 139  K 3.1* 2.8* 3.8 5.9* 3.1*  CL 97* 105 106 103 99*  CO2 29 27 25 26 31   GLUCOSE 171* 129* 117* 149* 167*  BUN 18 18 6 6 9   CREATININE 0.53 0.48 0.44 0.38* 0.31*  CALCIUM 9.3 8.6* 8.4* 8.4* 8.8*  MG  --   --   --  1.7  --   AST 20 19 20   --   --   ALT 25 21 18   --   --   ALKPHOS 74 62 66  --   --   BILITOT 0.4 0.3 0.5  --   --     ------------------------------------------------------------------------------------------------------------------ estimated creatinine clearance is 82.9 mL/min (by C-G formula based on Cr of 0.31). ------------------------------------------------------------------------------------------------------------------ No results for input(s): HGBA1C in the last 72 hours. ------------------------------------------------------------------------------------------------------------------ No results for input(s): CHOL, HDL, LDLCALC, TRIG, CHOLHDL, LDLDIRECT in the last 72 hours. ------------------------------------------------------------------------------------------------------------------ No results for input(s): TSH, T4TOTAL, T3FREE, THYROIDAB in the last 72 hours.  Invalid input(s): FREET3 ------------------------------------------------------------------------------------------------------------------ No results for input(s): VITAMINB12, FOLATE, FERRITIN, TIBC, IRON, RETICCTPCT in the last 72 hours.  Coagulation profile  Recent Labs Lab 06/07/15 0836  INR 1.32    No results for input(s): DDIMER in the last 72 hours.  Cardiac Enzymes  Recent Labs Lab 06/07/15 0443 06/07/15 1216 06/07/15 1836  TROPONINI 0.22* 0.18* 0.19*   ------------------------------------------------------------------------------------------------------------------ Invalid input(s): POCBNP    Avneet Ashmore D.O. on 06/10/2015 at 11:01 AM  Between 7am to 7pm - Pager - [phone]  After 7pm go to [email or web address] - password [email or web address]  And look for the night coverage person covering for me after hours  [facility] Office  [phone]

## 2015-06-11 DIAGNOSIS — N3000 Acute cystitis without hematuria: Secondary | ICD-10-CM

## 2015-06-11 LAB — CBC
HEMATOCRIT: 36.1 % (ref 36.0–46.0)
Hemoglobin: 11 g/dL — ABNORMAL LOW (ref 12.0–15.0)
MCH: 27.4 pg (ref 26.0–34.0)
MCHC: 30.5 g/dL (ref 30.0–36.0)
MCV: 89.8 fL (ref 78.0–100.0)
PLATELETS: 202 10*3/uL (ref 150–400)
RBC: 4.02 MIL/uL (ref 3.87–5.11)
RDW: 14.6 % (ref 11.5–15.5)
WBC: 9.5 10*3/uL (ref 4.0–10.5)

## 2015-06-11 LAB — BASIC METABOLIC PANEL
ANION GAP: 8 (ref 5–15)
BUN: 9 mg/dL (ref 6–20)
CALCIUM: 8.7 mg/dL — AB (ref 8.9–10.3)
CO2: 31 mmol/L (ref 22–32)
Chloride: 99 mmol/L — ABNORMAL LOW (ref 101–111)
Creatinine, Ser: <0.3 mg/dL — ABNORMAL LOW (ref 0.44–1.00)
GLUCOSE: 216 mg/dL — AB (ref 65–99)
POTASSIUM: 3.4 mmol/L — AB (ref 3.5–5.1)
Sodium: 138 mmol/L (ref 135–145)

## 2015-06-11 LAB — GLUCOSE, CAPILLARY
GLUCOSE-CAPILLARY: 186 mg/dL — AB (ref 65–99)
GLUCOSE-CAPILLARY: 199 mg/dL — AB (ref 65–99)
GLUCOSE-CAPILLARY: 205 mg/dL — AB (ref 65–99)
GLUCOSE-CAPILLARY: 167 mg/dL — AB (ref 65–99)

## 2015-06-11 MED ORDER — INSULIN ASPART 100 UNIT/ML ~~LOC~~ SOLN
0.0000 [IU] | SUBCUTANEOUS | Status: DC
  Administered 2015-06-11 – 2015-06-13 (×9): 2 [IU] via SUBCUTANEOUS
  Administered 2015-06-13: 1 [IU] via SUBCUTANEOUS
  Administered 2015-06-13 (×3): 2 [IU] via SUBCUTANEOUS

## 2015-06-11 MED ORDER — POTASSIUM CHLORIDE 20 MEQ/15ML (10%) PO SOLN
20.0000 meq | Freq: Once | ORAL | Status: AC
  Administered 2015-06-11: 20 meq
  Filled 2015-06-11: qty 15

## 2015-06-11 MED ORDER — MAGNESIUM SULFATE 2 GM/50ML IV SOLN
2.0000 g | Freq: Once | INTRAVENOUS | Status: AC
  Administered 2015-06-11: 2 g via INTRAVENOUS
  Filled 2015-06-11: qty 50

## 2015-06-11 NOTE — Progress Notes (Signed)
Rehab Admissions Coordinator Note:  Patient was screened by Retta Diones for appropriateness for an Inpatient Acute Rehab Consult.  Patient known to [place] from recent inpatient rehab admission.  I will check with rehab team and someone will follow up in am.    Jodell Cipro M 06/11/2015, 4:17 PM  I can be reached at [phone].

## 2015-06-11 NOTE — Progress Notes (Signed)
Pt. Arrived to unit and is settled and oriented. No signs of acute distress.

## 2015-06-11 NOTE — Evaluation (Signed)
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 Pt extubated 10/30. pt with hx of DM, HTN, Breast CA s/p Mastectomy, and Fibromyalgia.  Patient transferred to [facility] and then Inpatient Rehab.  Transferred back to acute on 06/07/15 due to PE.   Clinical Impression   This 59 yo female admitted with above presents to acute OT with flaccid right side, decreased communication, decreased mobility, decreased balance all affecting her ability to care for herself and increasing burden of care on her family. She will benefit from acute OT with return for short time to inpatient rehab to get her back to level that family can manage her at home.    Follow Up Recommendations  CIR    Equipment Recommendations  3 in 1 bedside comode;Wheelchair (measurements OT);Wheelchair cushion (measurements OT);Hospital bed;Other (comment) (hoyer lift)       Precautions / Restrictions Precautions Precautions: Fall Precaution Comments: Peg, Rt shoulder subluxation Restrictions Weight Bearing Restrictions: No      Mobility Bed Mobility Overal bed mobility: Needs Assistance Bed Mobility: Rolling;Sidelying to Sit;Sit to Sidelying Rolling: Min assist;Total assist Sidelying to sit: Mod assist     Sit to sidelying: Mod assist General bed mobility comments: rolls right with min assist and to left with total assist.   Transfers Overall transfer level: Needs assistance Equipment used: None Transfers: Sit to/from Stand;Stand Pivot Transfers Sit to Stand: +2 physical assistance;Max assist;From elevated surface Stand pivot transfers: +2 physical assistance;Mod assist        General transfer comment: Pt needed max assist of 2 persons to achieve sit to stand and mod assist of 2 for stand pivot transfer.  Blocked right knee and pt able to step left LE around for pivot transfer.  Pt with assist for steadying and use of pad to assist with hip extension.  Pt needed assist to control descent into chair as well.      Balance Overall balance assessment: Needs assistance Sitting-balance support: Single extremity supported;Feet supported Sitting balance-Leahy Scale: Poor Sitting balance - Comments: Pt initially total assist to sit EOB but with some facilitation was able to sit with min assist on EOB.  Pt at times pushes with her left UE.  Pt with better sitting balance after leaning on left elbow.    Postural control: Left lateral lean;Posterior lean Standing balance support: Single extremity supported Standing balance-Leahy Scale: Zero                              ADL Overall ADL's : Needs assistance/impaired Eating/Feeding: NPO   Grooming: Moderate assistance (supported sitting)   Upper Body Bathing: Moderate assistance (supported sitting)   Lower Body Bathing: Maximal assistance (with Max A +2 sit<>stand)   Upper Body Dressing : Maximal assistance (supported sitting)   Lower Body Dressing: Total assistance (with Max A +2 sit<>stand)   Toilet Transfer: Moderate assistance;+2 for physical assistance;Stand-pivot;BSC   Toileting- Clothing Manipulation and Hygiene: Total assistance (max A+2 sit<>stand)               Vision Vision Assessment?: Vision impaired- to be further tested in functional context  Pertinent Vitals/Pain Pain Assessment: Faces Faces Pain Scale: Hurts even more Pain Location: difficult to ascertain as pt is poor historian and hard to understand due to expressive difficulties Pain Descriptors / Indicators: Grimacing;Guarding Pain Intervention(s): Monitored during session;Repositioned;Limited activity within  patient's tolerance     Hand Dominance Right   Extremity/Trunk Assessment Upper Extremity Assessment Upper Extremity Assessment: RUE deficits/detail RUE Deficits / Details: flaccid with subluxation RUE Coordination: decreased gross motor;decreased fine motor           Communication Communication Communication: Expressive difficulties   Cognition Arousal/Alertness: Awake/alert Behavior During Therapy: WFL for tasks assessed/performed Overall Cognitive Status: Difficult to assess                        Exercises Exercises: General Lower Extremity          Home Living Family/patient expects to be discharged to:: Inpatient rehab Living Arrangements: Spouse/significant other Available Help at Discharge: Family;Available 24 hours/day Type of Home: House Home Access: Stairs to enter   Entrance Stairs-Rails: Right;Left;Can reach both Home Layout: Two level;Able to live on main level with bedroom/bathroom     Bathroom Shower/Tub: Tub/shower unit;Curtain   Bathroom Toilet: Standard Bathroom Accessibility: Yes How Accessible: Accessible via walker        Lives With: Spouse;Son    Prior Functioning/Environment Level of Independence: Independent        Comments: On CIR - husband has been educated on tranfers, w/c mobility.    OT Diagnosis: Generalized weakness;Cognitive deficits;Hemiplegia dominant side   OT Problem List: Decreased strength;Decreased range of motion;Impaired balance (sitting and/or standing);Pain;Impaired UE functional use;Decreased cognition;Impaired tone;Decreased knowledge of use of DME or AE   OT Treatment/Interventions: Self-care/ADL training;Patient/family education;Visual/perceptual remediation/compensation;Balance training;Therapeutic activities;DME and/or AE instruction    OT Goals(Current goals can be found in the care plan section) Acute Rehab OT Goals OT Goal Formulation: Patient unable to participate in goal setting Time For  Goal Achievement: 06/25/15 Potential to Achieve Goals: Fair  OT Frequency: Min 3X/week           Co-evaluation PT/OT/SLP Co-Evaluation/Treatment: Yes Reason for Co-Treatment: Complexity of the patient's impairments (multi-system involvement);For patient/therapist safety PT goals addressed during session: Mobility/safety with mobility OT goals addressed during session: ADL's and self-care;Strengthening/ROM      End of Session Equipment Utilized During Treatment: Gait belt Nurse Communication:  (maxi sky pad under her)  Activity Tolerance: Patient tolerated treatment well Patient left: in chair;with call bell/phone within reach;with chair alarm set (maxi sky pad under her)   Time: [id number] OT Time Calculation (min): 34 min Charges:  OT General Charges $OT Visit: 1 Procedure OT Evaluation $Initial OT Evaluation Tier I: 1 Procedure  Almon Register [id number] 06/11/2015, 3:29 PM

## 2015-06-11 NOTE — Progress Notes (Signed)
PROGRESS NOTE  Kerry [name] [id number] DOB: 1955-09-29 DOA: 06/07/2015 PCP: [name], MD  HPI on 06/07/2015 by Dr. Ivor Costa Kerry [name] is a 59 y.o. female with PMH of hypertension, hyperlipidemia, diabetes mellitus, GERD, CAD, breast cancer (s/P left mastectomy), DDD, carpal tunnel syndrome, recent large left MCA territory infarct with resolving associated hemorrhage after tPA treatment, s/p of PEG placement, right-sided hemiparalysis, currently doing rehab in [place] since 11/23.   Today pt developed tachycardia and hypotension. Cardiology was consulted, Dr. [name] suspected that patient may have PE, which is confirmed by CTA tonight. PCCM was consulted and recommended to start IV heparin. We are asked to admit pt. Pt has aphagia, but can tell that she has shortness of breath and mild chest pain. She does not seem to have abdominal pain or diarrhea. She is currently being treated for UTI with ancef since 12/13. CTA showed subsegmental right lower lobe pulmonary emboli. No evidence of right heart strain. Possible filling defect in the left atrial appendage.  Patient was found to have hypotension with blood pressure 82/46, potassium 3.1, troponin 0.33, lactate 1.1, tachycardia, tachypnea, temperature 99.8, WBC 15.4. Patient is admitted to stepdown bed for further eval and treatment.   Interim history Patient admitted from CIR for PE/DVT, was started on heparin and transition to [place].  Patient is currently being followed by pulmonary. ENT did see patient and recommended outpatient follow-up. PT recommended HH.  Assessment & Plan  Acute pulmonary embolism/DVT -As seen on CTA chest -Patient did have transient hypotension which was thought to be likely from sepsis rather than from pulmonary embolism -PCCM was consulted and appreciated, recommended heparin drip -Echocardiogram: EF 0000000, grade 1 diastolic dysfunction -Troponin currently trending downward,  was 0.46 currently 0.19 -Lower extremity Doppler: Right focal, acute nonocclusive DVT noted in CFV -Spoke with neurology, Dr. [name], regarding [facility] as patient recently received tPA for CVA- states it is fine to start a new agent and Plavix can be discontinued. 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 Recommended humidified nasal cannula oxygen to prevent epistaxis, consider outpatient (larygnologist at [facility] center) referral for laser arytenoidectomy/partial cordotomy once her current issues improved -Patient currently maintaining her O2 sats on 2L  Sepsis secondary to Urinary tract infection -Patient was noted to have leukocytosis, tachycardia, tachypnea, hypotension and febrile -Patient is being treated with Ancef -switch to cephalexin -Urine culture showed pansensitive Escherichia coli  Coffee-ground emesis -Gastroenterology was consulted, Dr. Michail Sermon suspected the epistaxis was the cause. EGD was not indicated at that time. It was okay to resume Plavix on 1214 -Continue Protonix -Hemoglobin has remained stable, currently 11.0 -Continue to monitor  CVA -Status post TPA and embolectomy by interventional radiology -Patient was admitted to inpatient rehabilitation -Patient has right-sided paresis as well as aphasia -Status post PEG placement--continue enteral  feeds -Continue statin, xarelto  Altered level of consciousness -Resolved -Overnight 06/08/2015, patient became somewhat unresponsive. -CT head: Normocephalic interval evolution change of the large left  MCA, associated edema has largely resolved. No new intracranial process  Hyperlipidemia -Last LDL on 04/12/2015 was 133 -Continue statin  Essential hypertension -Patient was hypotensive which improved with fluid bolus -Continue carvedilol and lisinopril  Hypokalemia/Hyperkalemia -check mag -Will replace -Continue to monitor BMP  Leukocytosis -Possibly secondary to steroids -Continue to monitor CBC -resolved Diabetes mellitus type 2 -04/28/2015 hemoglobin A1c 9.7 -NovoLog sliding scale  Code Status: Full  Family Communication: Husband updated bedside.   Disposition Plan: Transfer to telemetry. Plan for discharge in 24 hours if medically stable  Time Spent in minutes 30 minutes  Procedures  Lower extremity Doppler Laryngoscopy  Consults  Cardiology PCCM ENT Neurology, Dr. [name], via phone  DVT Prophylaxis Xarelto           Procedures/Studies: Dg Chest 2 View  06/05/2015  CLINICAL DATA:  Shortness of breath with exertion, personal history of breast cancer, patient smokes EXAM: CHEST  2 VIEW COMPARISON:  05/14/2015 FINDINGS: Stable mild cardiac enlargement. Vascular pattern normal. No consolidation or effusion. IMPRESSION: No active cardiopulmonary disease. Electronically Signed   By: Skipper Cliche M.D.   On: 06/05/2015 10:39   Ct Head Wo Contrast  06/09/2015  CLINICAL DATA:  Initial evaluation for deteriorating mental status. EXAM: CT HEAD WITHOUT CONTRAST TECHNIQUE: Contiguous axial images were obtained from the base of the skull through the vertex without intravenous contrast. COMPARISON:  Prior study from 04/16/2015. FINDINGS: There has been continued interval evolution of large volume left MCA territory infarct, overall stable in size and  distribution as compared to previous study. There is increased volume loss and encephalomalacia within the area of infarction. No evidence for associated hemorrhage. Previously seen midline shift has essentially resolved. No new acute large vessel territory infarct. No intracranial hemorrhage. No mass lesion. No hydrocephalus. Basilar cisterns are patent. No extra-axial fluid collection. Mild chronic microvascular ischemic disease noted within the periventricular white matter. Scattered atheromatous plaque within the carotid siphons. Scalp soft tissues within normal limits. No acute abnormality about the orbits. Paranasal sinuses are clear.  No mastoid effusion. Calvarium intact. IMPRESSION: 1. Normal expected interval evolutional change of large left MCA territory infarct. Associated edema has largely resolved. 2. No new intracranial process. Electronically Signed   By: Jeannine Boga M.D.   On: 06/09/2015 00:47   Ct Angio Chest Pe W/cm &/or Wo Cm  06/07/2015  CLINICAL DATA:  Tachycardia. EKG with sinus tachycardia and T-wave abnormality. EXAM: CT ANGIOGRAPHY CHEST WITH CONTRAST TECHNIQUE: Multidetector CT imaging of the chest was performed using the standard protocol during bolus administration of intravenous contrast. Multiplanar CT image reconstructions and MIPs were obtained to evaluate the vascular anatomy. CONTRAST:  80 mL OMNIPAQUE IOHEXOL 350 MG/ML SOLN COMPARISON:  04/20/2015 FINDINGS: Technically adequate study with good opacification of the central and segmental pulmonary arteries. Filling defects are demonstrated in the right lower lobe posterior subsegmental branch vessels consistent with pulmonary embolus. This may be acute or chronic. No large central pulmonary emboli. Possible filling defect in the left atrial appendage. Cardiac enlargement with evidence of left ventricular hypertrophy. R 8 RV ratio is less than 1 suggesting no evidence of right heart strain. Small pericardial effusion or  thickening. Esophagus is decompressed. No significant lymphadenopathy in the chest. Evaluation of lungs is limited due to respiratory motion artifact. Probable atelectasis in the lung bases. Emphysematous changes in the upper lungs. No pleural effusions. No pneumothorax. Included portions of the upper abdominal organs demonstrate calcified granulomas in the spleen. Degenerative changes in the spine. Review of  the MIP images confirms the above findings. IMPRESSION: Subsegmental right lower lobe pulmonary emboli. No evidence of right heart strain. Possible filling defect in the left atrial appendage. Cardiac enlargement with left ventricular hypertrophy. No evidence of active pulmonary disease. These results were called by telephone at the time of interpretation on 06/07/2015 at 12:44 am to [facility], the patient's nurse on MC-4w, who verbally acknowledged these results. Electronically Signed   By: Lucienne Capers M.D.   On: 06/07/2015 00:47   Dg Chest Port 1 View  05/14/2015  CLINICAL DATA:  Respiratory failure. Tracheostomy. Status post decannulation. EXAM: PORTABLE CHEST 1 VIEW COMPARISON:  04/27/2015 FINDINGS: Tracheostomy tube and right arm PICC line have been removed. The patient is partially rotated to the right. Both lungs are clear. No evidence of pneumothorax or pleural effusion. Heart size is stable. IMPRESSION: No active disease. Electronically Signed   By: Earle Gell M.D.   On: 05/14/2015 13:53   Dg Abd Portable 1v  06/04/2015  CLINICAL DATA:  Hematemesis. EXAM: PORTABLE ABDOMEN - 1 VIEW COMPARISON:  04/29/2015 FINDINGS: No evidence of bowel obstruction or ileus. Tube overlying the right abdomen appears to represent a gastrostomy tube. The region of the stomach is cut off at the top of the film. IMPRESSION: No evidence of bowel obstruction or ileus. Electronically Signed   By: Aletta Edouard M.D.   On: 06/04/2015 17:22   Dg Swallowing Func-speech Pathology  06/06/2015  Objective Swallowing  Evaluation:   Patient Details Name: Kerry [name] MRN: [id number] Date of Birth: [birth year] Today's Date: 06/06/2015 Time: SLP Start Time (ACUTE ONLY): 0902-SLP Stop Time (ACUTE ONLY): 0930 SLP Time Calculation (min) (ACUTE ONLY): 28 min Past Medical History: Past Medical History Diagnosis Date . Hypertension  . Coronary artery disease  . Diabetes mellitus  . Breast cancer ([place]) 03/10/2007   Left breat . Degenerative disc disease, cervical  . Degenerative disc disease, lumbar  . Fibromyalgia  . Hyperlipidemia  . Carpal tunnel syndrome on right  Past Surgical History: Past Surgical History Procedure Laterality Date . Cardiac surgery   . Appendectomy   . Breast surgery   . Mastectomy Left 03/2007 . Cardiac catheterization  2005 . Vein surgery Right 2005 . Tubal ligation Right  . Radiology with anesthesia N/A 04/11/2015   Procedure: RADIOLOGY WITH ANESTHESIA;  Surgeon: Luanne Bras, MD;  Location: [place];  Service: Radiology;  Laterality: N/A; . Cardiac catheterization N/A 04/13/2015   Procedure: Temporary Pacemaker;  Surgeon: Peter M Martinique, MD;  Location: [place] [facility];  Service: Cardiovascular;  Laterality: N/A; . Peg placement N/A 04/18/2015   Procedure: PERCUTANEOUS ENDOSCOPIC GASTROSTOMY (PEG) PLACEMENT;  Surgeon: Judeth Horn, MD;  Location: [place];  Service: General;  Laterality: N/A;  bedside . Esophagogastroduodenoscopy N/A 04/18/2015   Procedure: ESOPHAGOGASTRODUODENOSCOPY (EGD);  Surgeon: Judeth Horn, MD;  Location: [facility] ENDOSCOPY;  Service: General;  Laterality: N/A; HPI: LAQUENTA YA is a 59 y.o. female who was admitted on 04/11/15 with difficulty speaking, right facial and right sided weakness. CT showed dense L-MCA sign and TPA started but she had worsening of right hemiparesis with lethargy. Post procedure developed acute respiratory failure with dense right hemiparesis with global aphasia. She was vent dependent and developed small patchy areas of hemorrhage within acute Large  L-MCA infarct as well as cerebral edema. She continued to have lethargy with decrease in LOC requiring PEG placement by Dr. Hulen Skains and Lurline Idol with BAL performed by Dr Nelda Marseille.  She was weaned to trach collar but continued  to have significant secretions requiring frequent suctioning.  She was discharged to [facility] on 11/02 for medical management and rehab. She was decannulated on 11/21.  MBS done 11/14 and she was started on dysphagia 1, honey liquids on 11/21 and tube feeds ongoing as supplement. She was felt to be a good candidate for intensive rehab program and CIR was recommended for follow up therapy.  Repeat objective swallow study ordered today to determine readiness for diet progression.   No Data Recorded Assessment / Plan / Recommendation CHL IP CLINICAL IMPRESSIONS 06/06/2015 Therapy Diagnosis Mild oral phase dysphagia;Moderate pharyngeal phase dysphagia Clinical Impression Pt presents with improvements in swallowing function in comparison to initial MBS.  Pt now presents with a mild oral phase dysphagia and moderate pharyngeal phase dysphagia with both sensory and motor components.  Oral phase deficits are characterized by right sided labial and lingual weakness which results in weakened manipulation and prolonged oral transit of boluses to the oropharynx.  Decreased pharyngeal sensation and decreased base of tongue retraction also resulted in premature spillage of materials into the pharynx and delayed swallow initiation.  Swallow response was delayed to the vallecula with thickened liquids and solids, and further delayed to the pyriforms with thin liquids.  Delayed swallow initiation resulted in deep, silent penetration of thin liquids.    Pharyngeal weakness also resulted in mild-moderate residuals remaining in the vallecula post swallow.  Vallecular residuals were noted to spill over the epiglottis into the laryngeal vestibule and resulted in high flash penetration of solids during the swallow as well as  trace aspiration after the swallow during consumption of thin liquids.   Pt was noted to spontaneously use extra swallows which, in combination with SLP interventions for alternating solids and liquids effectively minimized the amount of residue in the pharynx.  Recommend diet progression to dys 2 textures and nectar thick liquids with full supervision for use of swallowing precautions.  Pt would also benefit from the water protocol to continue working towards further liquids progression.  Prognosis for advancement good with trials of advanced consistencies, SLP follow up for use of safe swallowing strategies, and repeat objective swallow study in 1-2 weeks.   Impact on safety and function Risk for inadequate nutrition/hydration;Moderate aspiration risk   No flowsheet data found.  CHL IP DIET RECOMMENDATION 06/06/2015 SLP Diet Recommendations Dysphagia 2 (Fine chop) solids;Nectar thick liquid Liquid Administration via Cup Medication Administration Other (Comment) Compensations Minimize environmental distractions;Slow rate;Small sips/bites;Multiple dry swallows after each bite/sip;Follow solids with liquid Postural Changes --   No flowsheet data found.         CHL IP ORAL PHASE 06/06/2015 Oral Phase Impaired Oral - Pudding Teaspoon -- Oral - Pudding Cup -- Oral - Honey Teaspoon -- Oral - Honey Cup Lingual/palatal residue;Decreased bolus cohesion;Weak lingual manipulation;Reduced posterior propulsion;Delayed oral transit;Premature spillage Oral - Nectar Teaspoon -- Oral - Nectar Cup Weak lingual manipulation;Reduced posterior propulsion;Premature spillage;Delayed oral transit;Decreased bolus cohesion;Lingual/palatal residue Oral - Nectar Straw -- Oral - Thin Teaspoon -- Oral - Thin Cup Weak lingual manipulation;Reduced posterior propulsion;Premature spillage;Delayed oral transit;Decreased bolus cohesion;Lingual/palatal residue Oral - Thin Straw Premature spillage;Lingual/palatal residue;Decreased bolus cohesion Oral -  Puree Weak lingual manipulation;Reduced posterior propulsion;Lingual/palatal residue;Premature spillage;Delayed oral transit Oral - Mech Soft -- Oral - Regular Weak lingual manipulation;Reduced posterior propulsion;Lingual/palatal residue;Premature spillage Oral - Multi-Consistency -- Oral - Pill -- Oral Phase - Comment --  CHL IP PHARYNGEAL PHASE 06/06/2015 Pharyngeal Phase Impaired Pharyngeal- Pudding Teaspoon -- Pharyngeal -- Pharyngeal- Pudding Cup -- Pharyngeal -- Pharyngeal-  Honey Teaspoon -- Pharyngeal -- Pharyngeal- Honey Cup Delayed swallow initiation-vallecula;Reduced tongue base retraction;Reduced laryngeal elevation;Reduced anterior laryngeal mobility;Pharyngeal residue - valleculae Pharyngeal -- Pharyngeal- Nectar Teaspoon -- Pharyngeal -- Pharyngeal- Nectar Cup Delayed swallow initiation-vallecula;Reduced tongue base retraction;Reduced anterior laryngeal mobility;Reduced laryngeal elevation;Pharyngeal residue - valleculae Pharyngeal -- Pharyngeal- Nectar Straw -- Pharyngeal -- Pharyngeal- Thin Teaspoon -- Pharyngeal -- Pharyngeal- Thin Cup Reduced airway/laryngeal closure;Reduced tongue base retraction;Reduced anterior laryngeal mobility;Reduced laryngeal elevation;Pharyngeal residue - valleculae;Pharyngeal residue - pyriform;Penetration/Aspiration during swallow;Penetration/Apiration after swallow;Delayed swallow initiation-pyriform sinuses Pharyngeal Material enters airway, CONTACTS cords and not ejected out;Material enters airway, passes BELOW cords without attempt by patient to eject out (silent aspiration) Pharyngeal- Thin Straw Compensatory strategies attempted (with notebox);Penetration/Aspiration during swallow;Reduced airway/laryngeal closure;Reduced anterior laryngeal mobility;Reduced laryngeal elevation;Reduced tongue base retraction;Pharyngeal residue - pyriform;Pharyngeal residue - valleculae Pharyngeal Material enters airway, CONTACTS cords and then ejected out Pharyngeal- Puree Delayed  swallow initiation-vallecula;Reduced tongue base retraction;Pharyngeal residue - valleculae;Reduced anterior laryngeal mobility Pharyngeal -- Pharyngeal- Mechanical Soft -- Pharyngeal -- Pharyngeal- Regular Delayed swallow initiation-vallecula;Reduced anterior laryngeal mobility;Reduced tongue base retraction;Pharyngeal residue - valleculae Pharyngeal -- Pharyngeal- Multi-consistency -- Pharyngeal -- Pharyngeal- Pill -- Pharyngeal -- Pharyngeal Comment --  No flowsheet data found. Page, Selinda Orion 06/06/2015, 12:37 PM                    Subjective: Patient denies fevers, chills, headache, chest pain, dyspnea, nausea, vomiting, diarrhea, abdominal pain, dysuria, hematuria   Objective: Filed Vitals:   06/11/15 0455 06/11/15 0500 06/11/15 0600 06/11/15 0751  BP:   123/71 131/75  Pulse:  87  96  Temp: 98.5 F (36.9 C)   97.5 F (36.4 C)  TempSrc: Oral   Oral  Resp:  26 22 26   Weight:      SpO2:  99%  100%    Intake/Output Summary (Last 24 hours) at 06/11/15 1037 Last data filed at 06/11/15 0944  Gross per 24 hour  Intake   1167 ml  Output   1125 ml  Net     42 ml   Weight change:  Exam:   General:  Pt is alert, follows commands appropriately, not in acute distress  HEENT: No icterus, No thrush, No neck mass, [place]/AT  Cardiovascular: RRR, S1/S2, no rubs, no gallops  Respiratory: Upper airway stridor. No wheezing  Abdomen: Soft/+BS, non tender, non distended, no guarding  Extremities: trace LE edema, No lymphangitis, No petechiae, No rashes, no synovitis  Data Reviewed: Basic Metabolic Panel:  Recent Labs Lab 06/07/15 0836 06/08/15 0559 06/09/15 0321 06/10/15 0400 06/11/15 0434  NA 140 139 138 139 138  K 2.8* 3.8 5.9* 3.1* 3.4*  CL 105 106 103 99* 99*  CO2 27 25 26 31 31   GLUCOSE 129* 117* 149* 167* 216*  BUN 18 6 6 9 9   CREATININE 0.48 0.44 0.38* 0.31* <0.30*  CALCIUM 8.6* 8.4* 8.4* 8.8* 8.7*  MG  --   --  1.7  --   --    Liver Function Tests:  Recent  Labs Lab 06/06/15 1928 06/07/15 0836 06/08/15 0559  AST 20 19 20   ALT 25 21 18   ALKPHOS 74 62 66  BILITOT 0.4 0.3 0.5  PROT 7.0 5.8* 5.7*  ALBUMIN 3.2* 2.8* 2.8*   No results for input(s): LIPASE, AMYLASE in the last 168 hours. No results for input(s): AMMONIA in the last 168 hours. CBC:  Recent Labs Lab 06/06/15 1928 06/07/15 0836  06/08/15 0559 06/08/15 1234 06/09/15 0828 06/10/15 0400 06/11/15 0434  WBC  15.4* 10.0  < > 11.7* 13.6* 7.7 10.6* 9.5  NEUTROABS 10.5* 5.8  --   --   --   --   --   --   HGB 13.6 11.6*  < > 11.6* 12.2 12.3 11.4* 11.0*  HCT 42.4 37.4  < > 35.9* 37.3 37.3 35.1* 36.1  MCV 86.9 89.3  < > 88.2 86.9 87.4 88.6 89.8  PLT 354 250  < > 229 257 243 240 202  < > = values in this interval not displayed. Cardiac Enzymes:  Recent Labs Lab 06/06/15 1928 06/06/15 2256 06/07/15 0443 06/07/15 1216 06/07/15 1836  TROPONINI 0.46* 0.33* 0.22* 0.18* 0.19*   BNP: Invalid input(s): POCBNP CBG:  Recent Labs Lab 06/10/15 0751 06/10/15 1200 06/10/15 1618 06/10/15 2207 06/11/15 0750  GLUCAP 159* 187* 169* 209* 186*    Recent Results (from the past 240 hour(s))  Culture, blood (x 2)     Status: None (Preliminary result)   Collection Time: 06/07/15  8:19 AM  Result Value Ref Range Status   Specimen Description BLOOD RIGHT HAND  Final   Special Requests BOTTLES DRAWN AEROBIC ONLY 4ML  Final   Culture NO GROWTH 3 DAYS  Final   Report Status PENDING  Incomplete  Culture, blood (x 2)     Status: None (Preliminary result)   Collection Time: 06/07/15  8:25 AM  Result Value Ref Range Status   Specimen Description BLOOD RIGHT HAND  Final   Special Requests BOTTLES DRAWN AEROBIC ONLY 1ML  Final   Culture NO GROWTH 3 DAYS  Final   Report Status PENDING  Incomplete  MRSA PCR Screening     Status: None   Collection Time: 06/08/15  2:14 PM  Result Value Ref Range Status   MRSA by PCR NEGATIVE NEGATIVE Final    Comment:        The GeneXpert MRSA Assay  (FDA approved for NASAL specimens only), is one component of a comprehensive MRSA colonization surveillance program. It is not intended to diagnose MRSA infection nor to guide or monitor treatment for MRSA infections.      Scheduled Meds: . antiseptic oral rinse  7 mL Mouth Rinse q12n4p  . atorvastatin  40 mg Oral q1800  . bacitracin   Topical TID  . carvedilol  12.5 mg Oral BID WC  .  ceFAZolin (ANCEF) IV  1 g Intravenous 3 times per day  . chlorhexidine  15 mL Mouth Rinse BID  . feeding supplement (PRO-STAT SUGAR FREE 64)  30 mL Oral Daily  . feeding supplement (VITAL AF 1.2 CAL)  237 mL Per Tube TID  . folic acid  1 mg Oral Daily  . free water  200 mL Per Tube 3 times per day  . lisinopril  10 mg Oral Daily  . magnesium sulfate 1 - 4 g bolus IVPB  2 g Intravenous Once  . multivitamin with minerals  1 tablet Oral Daily  . nicotine  7 mg Transdermal Daily  . oxymetazoline  2 spray Each Nare 3 times per day  . rivaroxaban  15 mg Oral BID WC   Followed by  . [START ON 06/30/2015] rivaroxaban  20 mg Oral Q supper  . sodium chloride  3 mL Intravenous Q12H  . thiamine  100 mg Oral Daily   Continuous Infusions:    Carlisia Geno, DO  [facility] Pager [phone]  If 7PM-7AM, please contact night-coverage [email or web address] Password [email or web address] 06/11/2015, 10:37 AM   LOS: 4 days

## 2015-06-11 NOTE — Progress Notes (Signed)
ANTICOAGULATION CONSULT NOTE - Follow Up Consult  Pharmacy Consult for Xarelto Indication: pulmonary embolus  Allergies  Allergen Reactions  . Codeine     Nausea?-- "made her sick"  . Oxycodone Nausea Only    Sick   . Oxycontin [Oxycodone Hcl] Nausea Only    Sick     Patient Measurements: Weight: 176 lb 9.4 oz (80.1 kg)  Vital Signs: Temp: 97.5 F (36.4 C) (12/19 0751) Temp Source: Oral (12/19 0751) BP: 131/75 mmHg (12/19 0751) Pulse Rate: 96 (12/19 0751)  Labs:  Recent Labs  06/09/15 0321 06/09/15 0828 06/10/15 0400 06/11/15 0434  HGB  --  12.3 11.4* 11.0*  HCT  --  37.3 35.1* 36.1  PLT  --  243 240 202  CREATININE 0.38*  --  0.31* <0.30*    CrCl cannot be calculated (Patient has no serum creatinine result on file.).   Medications:  Scheduled:  . antiseptic oral rinse  7 mL Mouth Rinse q12n4p  . atorvastatin  40 mg Oral q1800  . bacitracin   Topical TID  . carvedilol  12.5 mg Oral BID WC  .  ceFAZolin (ANCEF) IV  1 g Intravenous 3 times per day  . chlorhexidine  15 mL Mouth Rinse BID  . feeding supplement (PRO-STAT SUGAR FREE 64)  30 mL Oral Daily  . feeding supplement (VITAL AF 1.2 CAL)  237 mL Per Tube TID  . folic acid  1 mg Oral Daily  . free water  200 mL Per Tube 3 times per day  . insulin aspart  0-9 Units Subcutaneous 6 times per day  . lisinopril  10 mg Oral Daily  . magnesium sulfate 1 - 4 g bolus IVPB  2 g Intravenous Once  . multivitamin with minerals  1 tablet Oral Daily  . nicotine  7 mg Transdermal Daily  . oxymetazoline  2 spray Each Nare 3 times per day  . rivaroxaban  15 mg Oral BID WC   Followed by  . [START ON 06/30/2015] rivaroxaban  20 mg Oral Q supper  . sodium chloride  3 mL Intravenous Q12H  . thiamine  100 mg Oral Daily   Infusions:   PRN: acetaminophen, bisacodyl, hydrALAZINE, ipratropium-albuterol, nitroGLYCERIN, senna-docusate  Assessment: 11 YOF with history of stroke in November now with PE/DVT. Dr. Ree Kida spoke with  stroke team and they okay'd staring anticoagulation and stopping Plavix.  Awaiting full benefits check to be completed, but per conversation with Case Manager Jackelyn Poling, patient has private insurance and qualifies to receive 30-day free cards for Xarelto and Eliquis. Co-pay would be $0 for Xarelto and $10 for Eliquis- decision made with Dr. Ree Kida to start [facility].  Hgb 11, plts 202, No bleeding currently noted  Goal of Therapy:  Monitor platelets by anticoagulation protocol: Yes   Plan:  -Xarelto 15mg  BID x21days, then Xarelto 20mg  qsupper starting 06/30/15 -continue heparin at 1200 units/hr until first dose of Xarelto given -CBC q72h -follow for s/s bleeding -education will be provided to patient  Bennye Alm, PharmD Pharmacy Resident [phone] 06/11/2015,11:30 AM

## 2015-06-11 NOTE — Progress Notes (Signed)
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 Pt extubated 10/30. pt with hx of DM, HTN, Breast CA s/p Mastectomy, and Fibromyalgia.  Patient transferred to [facility] and then Inpatient Rehab.  Transferred back to acute on 06/07/15 due to PE.    PT Comments    Pt admitted with above diagnosis. Pt currently with functional limitations due to poor balance and poor endurance.  Pt needed total assist initially to sit EOB and then progressed to sitting with min assist.  Pt requiring 2 persons to perform stand pivot transfer with pt needing max assist to stand and mod assist of 2 for transfer to chair.  Pt performing more poorly than when on Rehab and PT/OT feel that pt may need to return to Rehab to continue therapy given this setback prior to going home with family. Called husband to update him and he is going to come in Wed at 11 am to observe pts mobility.  Given that pt mobility is worse, if Rehab does not take pt back, family may need hoyer lift as well as wheelchair, hospital bed and drop arm 3N1.  Will continue to follow acutely.  Pt will benefit from skilled PT to increase their independence and safety with mobility to allow discharge to the venue listed below.    Follow Up Recommendations  CIR;Supervision/Assistance - 24 hour     Equipment Recommendations  Wheelchair (measurements PT);Wheelchair cushion (measurements PT);Hospital bed (drop arm 3N1, ? hoyer lift)    Recommendations for Other Services Rehab consult     Precautions / Restrictions Precautions Precautions: Fall Precaution Comments: Peg, Rt shoulder subluxation Restrictions Weight Bearing  Restrictions: No    Mobility  Bed Mobility Overal bed mobility: Needs Assistance Bed Mobility: Rolling;Sidelying to Sit;Sit to Sidelying Rolling: Min assist;Total assist Sidelying to sit: Mod assist       General bed mobility comments: rolls right with min assist and to left with total assist.   Transfers Overall transfer level: Needs assistance Equipment used: None Transfers: Sit to/from Stand;Stand Pivot Transfers Sit to Stand: +2 physical assistance;Max assist;From elevated surface Stand pivot transfers: +2 physical assistance;Mod assist       General transfer comment: Pt needed max assist of 2 persons to achieve sit to stand and mod assist of 2 for stand pivot transfer.  Blocked right knee and pt able to step left LE around for pivot transfer.  Pt with assist for steadying and use of pad to assist with hip extension.  Pt needed assist to control descent into chair as well.    Ambulation/Gait                 Stairs            Wheelchair Mobility    Modified Rankin (Stroke Patients Only) Modified Rankin (Stroke Patients Only) Pre-Morbid Rankin Score: No symptoms Modified Rankin: Severe disability     Balance Overall balance assessment: Needs assistance Sitting-balance support: Single extremity supported;Feet supported Sitting balance-Leahy Scale: Poor Sitting balance - Comments: Pt initially total assist to sit EOB but with some facilitation was able to sit with min assist on EOB.  Pt at times pushes with her left UE.  Pt with better  sitting balance after leaning on left elbow.    Postural control: Left lateral lean;Posterior lean                          Cognition Arousal/Alertness: Awake/alert Behavior During Therapy: WFL for tasks assessed/performed Overall Cognitive Status: Difficult to assess                      Exercises General Exercises - Lower Extremity Long Arc Quad: AROM;Left;10 reps;Seated Hip Flexion/Marching:  AROM;Left;10 reps;Seated    General Comments        Pertinent Vitals/Pain Pain Assessment: Faces Faces Pain Scale: Hurts even more Pain Location: difficult to ascertain as pt is poor historian and hard to understand due to expressive difficulties Pain Descriptors / Indicators: Grimacing;Guarding Pain Intervention(s): Monitored during session;Repositioned;Limited activity within patient's tolerance  VSS    Home Living Family/patient expects to be discharged to:: Inpatient rehab Living Arrangements: Spouse/significant other Available Help at Discharge: Family;Available 24 hours/day Type of Home: House Home Access: Stairs to enter Entrance Stairs-Rails: Right;Left;Can reach both Home Layout: Two level;Able to live on main level with bedroom/bathroom        Prior Function Level of Independence: Independent      Comments: On CIR - husband has been educated on tranfers, w/c mobility.   PT Goals (current goals can now be found in the care plan section) Progress towards PT goals: Progressing toward goals    Frequency  Min 3X/week    PT Plan Discharge plan needs to be updated    Co-evaluation PT/OT/SLP Co-Evaluation/Treatment: Yes Reason for Co-Treatment: Complexity of the patient's impairments (multi-system involvement);For patient/therapist safety PT goals addressed during session: Mobility/safety with mobility OT goals addressed during session: ADL's and self-care;Strengthening/ROM     End of Session Equipment Utilized During Treatment: Gait belt;Oxygen Activity Tolerance: Patient limited by fatigue Patient left: in chair;with call bell/phone within reach;with chair alarm set     Time: [id number] PT Time Calculation (min) (ACUTE ONLY): 26 min  Charges:  $Therapeutic Activity: 8-22 mins                    G CodesDenice Paradise June 19, 2015, 3:24 PM Marlina Cataldi,PT Acute Rehabilitation [phone] (pager)

## 2015-06-11 NOTE — Progress Notes (Signed)
PCCM Interval Note  Pt with continued insp stridor, appears to be stable to improved - less abd muscle use. 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 As she stabilizes could consider FOB to assess patency of the trachea. Spirometry would also help answer the question but not sure she could participate now. Agree with recs to have her [facility] to [facility] laser SGY of the glottic component of this.   PCCM will sign off for now.   Baltazar Apo, MD, PhD 06/11/2015, 11:11 AM [facility] [phone] or if no answer [phone]

## 2015-06-12 DIAGNOSIS — E1151 Type 2 diabetes mellitus with diabetic peripheral angiopathy without gangrene: Secondary | ICD-10-CM

## 2015-06-12 LAB — GLUCOSE, CAPILLARY
GLUCOSE-CAPILLARY: 173 mg/dL — AB (ref 65–99)
GLUCOSE-CAPILLARY: 152 mg/dL — AB (ref 65–99)
GLUCOSE-CAPILLARY: 171 mg/dL — AB (ref 65–99)
Glucose-Capillary: 172 mg/dL — ABNORMAL HIGH (ref 65–99)
Glucose-Capillary: 156 mg/dL — ABNORMAL HIGH (ref 65–99)
Glucose-Capillary: 175 mg/dL — ABNORMAL HIGH (ref 65–99)

## 2015-06-12 LAB — CULTURE, BLOOD (ROUTINE X 2)
CULTURE: NO GROWTH
CULTURE: NO GROWTH

## 2015-06-12 LAB — BASIC METABOLIC PANEL
Anion gap: 9 (ref 5–15)
BUN: 8 mg/dL (ref 6–20)
CHLORIDE: 101 mmol/L (ref 101–111)
CO2: 31 mmol/L (ref 22–32)
CREATININE: 0.32 mg/dL — AB (ref 0.44–1.00)
Calcium: 9.4 mg/dL (ref 8.9–10.3)
GFR calc Af Amer: >60 mL/min (ref >60)
GFR calc non Af Amer: >60 mL/min (ref >60)
Glucose, Bld: 188 mg/dL — ABNORMAL HIGH (ref 65–99)
Potassium: 3.9 mmol/L (ref 3.5–5.1)
Sodium: 141 mmol/L (ref 135–145)

## 2015-06-12 LAB — MAGNESIUM: MAGNESIUM: 1.9 mg/dL (ref 1.7–2.4)

## 2015-06-12 MED ORDER — CEPHALEXIN 250 MG/5ML PO SUSR
500.0000 mg | Freq: Four times a day (QID) | ORAL | Status: DC
  Filled 2015-06-12 (×2): qty 10

## 2015-06-12 MED ORDER — CEPHALEXIN 250 MG/5ML PO SUSR
500.0000 mg | Freq: Four times a day (QID) | ORAL | Status: DC
  Administered 2015-06-12 – 2015-06-13 (×4): 500 mg
  Filled 2015-06-12 (×8): qty 10

## 2015-06-12 MED ORDER — FREE WATER: 200.0000 mL | Freq: Three times a day (TID) | Status: DC

## 2015-06-12 MED ORDER — CEPHALEXIN 250 MG/5ML PO SUSR
500.0000 mg | Freq: Four times a day (QID) | ORAL | Status: DC
  Filled 2015-06-12 (×3): qty 10

## 2015-06-12 NOTE — Progress Notes (Signed)
I met with pt, her spouse, [name], at bedside along with son, [name], via conference phone call. We discussed the need for a possible readmit to inpt rehab vs direct d/c home. They prefer for readmission to inpt rehab to complete her rehab course before d/c home. I will discuss with rehab team and then begin authorization with [facility]. RN CM is aware. Hopeful for insurance approval for admit tomorrow. [phone]

## 2015-06-12 NOTE — Evaluation (Signed)
Clinical/Bedside Swallow Evaluation Patient Details  Name: Kerry [name] MRN: [id number] Date of Birth: 09-17-1955  Today's Date: 06/12/2015 Time: SLP Start Time (ACUTE ONLY): 1502 SLP Stop Time (ACUTE ONLY): 1513 SLP Time Calculation (min) (ACUTE ONLY): 11 min  Past Medical History:  Past Medical History  Diagnosis Date  . Hypertension   . Coronary artery disease   . Diabetes mellitus   . Breast cancer ([place]) 03/10/2007    Left breat  . Degenerative disc disease, cervical   . Degenerative disc disease, lumbar   . Fibromyalgia   . Hyperlipidemia   . Carpal tunnel syndrome on right   . PE (pulmonary embolism)   . UTI (lower urinary tract infection)   . GIB (gastrointestinal bleeding)   . Elevated troponin    Past Surgical History:  Past Surgical History  Procedure Laterality Date  . Cardiac surgery    . Appendectomy    . Breast surgery    . Mastectomy Left 03/2007  . Cardiac catheterization  2005  . Vein surgery Right 2005  . Tubal ligation Right   . Radiology with anesthesia N/A 04/11/2015    Procedure: RADIOLOGY WITH ANESTHESIA;  Surgeon: Luanne Bras, MD;  Location: [facility];  Service: Radiology;  Laterality: N/A;  . Cardiac catheterization N/A 04/13/2015    Procedure: Temporary Pacemaker;  Surgeon: Peter M Martinique, MD;  Location: [place] CV LAB;  Service: Cardiovascular;  Laterality: N/A;  . Peg placement N/A 04/18/2015    Procedure: PERCUTANEOUS ENDOSCOPIC GASTROSTOMY (PEG) PLACEMENT;  Surgeon: Judeth Horn, MD;  Location: [facility];  Service: General;  Laterality: N/A;  bedside  . Esophagogastroduodenoscopy N/A 04/18/2015    Procedure: ESOPHAGOGASTRODUODENOSCOPY (EGD);  Surgeon: Judeth Horn, MD;  Location[facility] END[facility]COPY;  Service: General;  Laterality: N/A;   HPI:  pt presents with dense L MCA Infarct involving L Temporal, Frontoparietal, and L Basal Ganglia regions and was started on TPA, but unable to complete full dose due to hypertension, but did  recieve IR intervention. Pt extubated 10/30. pt with hx of DM, HTN, Breast CA s/p Mastectomy, and Fibromyalgia. Patient transferred to [facility] and then Inpatient Rehab. Transferred back to acute on 06/07/15 due to PE.   Assessment / Plan / Recommendation Clinical Impression  Pt's swallowing function appears grossly similar to MBS completed 12/14, including multiple swallows which were helpful to reduce pharyngeal residue. No overt signs of aspiration were seemed with thin liquids although aspiration on previous study had been silent, and coughing was observed following several large, consecutive cup sips of nectar thick liquid. Recommend to restart diet previously recommended: Dys 2 textures and nectar thick liquids by cup. SLP to f/u for tolerance and to determine if/when further objective testing may be warranted.    Aspiration Risk  Mild aspiration risk;Moderate aspiration risk    Diet Recommendation  Dys 2, nectar thick liquids by cup (no straws) Alternate solids/liquids   Medication Administration: Crushed with puree    Other  Recommendations Oral Care Recommendations: Oral care BID Other Recommendations: Order thickener from pharmacy;Prohibited food (jello, ice cream, thin soups);Remove water pitcher   Follow up Recommendations  Inpatient Rehab    Frequency and Duration min 2x/week  2 weeks       Prognosis Prognosis for Safe Diet Advancement: Good Barriers to Reach Goals: Language deficits      Swallow Study   General HPI: pt presents with 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 Pt extubated 10/30. pt with hx of DM, HTN, Breast CA s/p Mastectomy, and Fibromyalgia. Patient transferred to [facility] and then Inpatient Rehab. Transferred back to acute on 06/07/15 due to PE. Type of Study:  Bedside Swallow Evaluation Previous Swallow Assessment: MBS 12/14 recommending Dys 2 diet and nectar thcik liquids due to silent aspiration of thin liquids Diet Prior to this Study: NPO Temperature Spikes Noted: No Respiratory Status: Room air History of Recent Intubation: No Behavior/Cognition: Alert;Cooperative;Requires cueing Oral Cavity Assessment: Within Functional Limits Oral Care Completed by SLP: No Oral Cavity - Dentition: Poor condition;Missing dentition Vision: Functional for self-feeding Self-Feeding Abilities: Able to feed self Patient Positioning: Upright in bed Baseline Vocal Quality: Normal Volitional Cough: Cognitively unable to elicit Volitional Swallow: Unable to elicit    Oral/Motor/Sensory Function Overall Oral Motor/Sensory Function:  (difficult to assess due to motor planning deficit)   Ice Chips Ice chips: Not tested   Thin Liquid Thin Liquid: Impaired Presentation: Cup;Self Fed Pharyngeal  Phase Impairments: Suspected delayed Swallow;Multiple swallows    Nectar Thick Nectar Thick Liquid: Impaired Presentation: Cup;Self Fed Pharyngeal Phase Impairments: Suspected delayed Swallow;Cough - Immediate;Multiple swallows (cough x1 following large consecutive sips)   Honey Thick Honey Thick Liquid: Not tested   Puree Puree: Impaired Presentation: Self Fed;Spoon Oral Phase Functional Implications: Prolonged oral transit Pharyngeal Phase Impairments: Suspected delayed Swallow;Multiple swallows   Solid Solid: Impaired Presentation: Self Fed Oral Phase Functional Implications: Prolonged oral transit;Impaired mastication Pharyngeal Phase Impairments: Multiple swallows      Germain Osgood, M.A. CCC-SLP [phone]  Germain Osgood 06/12/2015,3:27 PM

## 2015-06-12 NOTE — Progress Notes (Signed)
PROGRESS NOTE  Kerry [name] [id number] DOB: 1955-07-26 DOA: 06/07/2015 PCP: [name], MD Brief History Kerry [name] is a 59 y.o. female with PMH of hypertension, hyperlipidemia, diabetes mellitus, GERD, CAD, breast cancer (s/P left mastectomy), DDD, carpal tunnel syndrome, recent large left MCA territory infarct with resolving associated hemorrhage after tPA treatment, s/p of PEG placement, right-sided hemiparalysis, currently doing rehab in [place] since 11/23.   Today pt developed tachycardia and hypotension. Cardiology was consulted, Dr. [name] suspected that patient may have PE, which is confirmed by CTA tonight. PCCM was consulted and recommended to start IV heparin. We are asked to admit pt. Pt has aphagia, but can tell that she has shortness of breath and mild chest pain. She does not seem to have abdominal pain or diarrhea. She is currently being treated for UTI with ancef since 12/13. CTA showed subsegmental right lower lobe pulmonary emboli. No evidence of right heart strain. Possible filling defect in the left atrial appendage.  Patient was found to have hypotension with blood pressure 82/46, potassium 3.1, troponin 0.33, lactate 1.1, tachycardia, tachypnea, temperature 99.8, WBC 15.4. Patient is admitted to stepdown bed for further eval and treatment.  Patient admitted from CIR for PE/DVT, was started on heparin and transition to [place]. Patient is currently being followed by pulmonary. ENT did see patient and recommended outpatient follow-up. PT now recommended CIR Assessment/Plan: Acute pulmonary embolism/DVT -As seen on CTA chest -Patient did have transient hypotension which was thought to be likely from sepsis rather than from pulmonary embolism -PCCM was consulted and appreciated, recommended heparin drip prior to transition to Xarelto -Echocardiogram: EF 0000000, grade 1 diastolic dysfunction -Troponin currently trending downward, was 0.46  currently 0.19 -Lower extremity Doppler: Right focal, acute nonocclusive DVT noted in CFV -Spoke with neurology, Dr. [name], regarding [facility] as patient recently received tPA for CVA- states it is fine to start a new agent and Plavix can be discontinued. 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 Recommended humidified nasal cannula oxygen to prevent epistaxis, consider outpatient (larygnologist at [facility]) referral for laser arytenoidectomy/partial cordotomy once her current issues improved -Patient currently maintaining her O2 sats on 2L  Sepsis secondary to Urinary tract infection -Patient was noted to have leukocytosis, tachycardia, tachypnea, hypotension and febrile -Patient is being treated with Ancef -switch to cephalexin -Urine culture showed pansensitive Escherichia coli  Coffee-ground emesis -Gastroenterology was consulted, Dr. Michail Sermon suspected the epistaxis was the cause. EGD was not indicated at that time. It was okay to resume Plavix on 1214 -Continue Protonix -Hemoglobin has remained stable, currently 11.0 -Continue to monitor  CVA -Status post TPA and embolectomy by interventional radiology -Patient was admitted to inpatient rehabilitation -Patient has right-sided paresis as well as aphasia -Status post PEG placement--continue enteral feeds -Continue  statin, xarelto -request repeat speech therapy eval  Altered level of consciousness -Resolved -Overnight 06/08/2015, patient became somewhat unresponsive. -CT head: Normocephalic interval evolution change of the large left MCA, associated edema  has largely resolved. No new intracranial process  Hyperlipidemia -Last LDL on 04/12/2015 was 133 -Continue statin  Essential hypertension -Patient was hypotensive which improved with fluid bolus -Continue carvedilol and lisinopril  Hypokalemia -check mag--1.9 -repleted -Continue to monitor BMP  Leukocytosis -Possibly secondary to steroids -Continue to monitor CBC -resolved Diabetes mellitus type 2 -04/28/2015 hemoglobin A1c 9.7 -NovoLog sliding scale -pt previously refused insulin due to cost   Family Communication:   Husband updated at beside Disposition Plan:   Home vs CIR on 06/13/15       Procedures/Studies: Dg Chest 2 View  06/05/2015  CLINICAL DATA:  Shortness of breath with exertion, personal history of breast cancer, patient smokes EXAM: CHEST  2 VIEW COMPARISON:  05/14/2015 FINDINGS: Stable mild cardiac enlargement. Vascular pattern normal. No consolidation or effusion. IMPRESSION: No active cardiopulmonary disease. Electronically Signed   By: Skipper Cliche M.D.   On: 06/05/2015 10:39   Ct Head Wo Contrast  06/09/2015  CLINICAL DATA:  Initial evaluation for deteriorating mental status. EXAM: CT HEAD WITHOUT CONTRAST TECHNIQUE: Contiguous axial images were obtained from the base of the skull through the vertex without intravenous contrast. COMPARISON:  Prior study from 04/16/2015. FINDINGS: There has been continued interval evolution of large volume left MCA territory infarct, overall stable in size and distribution as compared to previous study. There is increased volume loss and encephalomalacia within the area of infarction. No evidence for associated hemorrhage. Previously seen midline shift has essentially  resolved. No new acute large vessel territory infarct. No intracranial hemorrhage. No mass lesion. No hydrocephalus. Basilar cisterns are patent. No extra-axial fluid collection. Mild chronic microvascular ischemic disease noted within the periventricular white matter. Scattered atheromatous plaque within the carotid siphons. Scalp soft tissues within normal limits. No acute abnormality about the orbits. Paranasal sinuses are clear.  No mastoid effusion. Calvarium intact. IMPRESSION: 1. Normal expected interval evolutional change of large left MCA territory infarct. Associated edema has largely resolved. 2. No new intracranial process. Electronically Signed   By: Jeannine Boga M.D.   On: 06/09/2015 00:47   Ct Angio Chest Pe W/cm &/or Wo Cm  06/07/2015  CLINICAL DATA:  Tachycardia. EKG with sinus tachycardia and T-wave abnormality. EXAM: CT ANGIOGRAPHY CHEST WITH CONTRAST TECHNIQUE: Multidetector CT imaging of the chest was performed using the standard protocol during bolus administration of intravenous contrast. Multiplanar CT image reconstructions and MIPs were obtained to evaluate the vascular anatomy. CONTRAST:  80 mL OMNIPAQUE IOHEXOL 350 MG/ML SOLN COMPARISON:  04/20/2015 FINDINGS: Technically adequate study with good opacification of the central and segmental pulmonary arteries. Filling defects are demonstrated in the right lower lobe posterior subsegmental branch vessels consistent with pulmonary embolus. This may be acute or chronic. No large central pulmonary emboli. Possible filling defect in the left atrial appendage. Cardiac enlargement with evidence of left ventricular hypertrophy. R 8 RV ratio is less than 1 suggesting no evidence of right heart strain. Small pericardial effusion or thickening. Esophagus is decompressed. No significant lymphadenopathy in the chest. Evaluation of lungs is limited due to respiratory motion artifact. Probable atelectasis in the lung bases. Emphysematous changes  in the upper lungs. No pleural effusions. No pneumothorax. Included portions of the upper abdominal organs demonstrate calcified granulomas in the spleen. Degenerative changes in the spine. Review of the MIP images confirms the above findings. IMPRESSION: Subsegmental right lower lobe pulmonary emboli. No evidence of right heart strain. Possible filling defect in the left atrial appendage. Cardiac enlargement with left ventricular hypertrophy. No evidence of  active pulmonary disease. These results were called by telephone at the time of interpretation on 06/07/2015 at 12:44 am to [facility], the patient's nurse on [facility]-4w, who verbally acknowledged these results. Electronically Signed   By: Lucienne Capers M.D.   On: 06/07/2015 00:47   Dg Chest Port 1 View  05/14/2015  CLINICAL DATA:  Respiratory failure. Tracheostomy. Status post decannulation. EXAM: PORTABLE CHEST 1 VIEW COMPARISON:  04/27/2015 FINDINGS: Tracheostomy tube and right arm PICC line have been removed. The patient is partially rotated to the right. Both lungs are clear. No evidence of pneumothorax or pleural effusion. Heart size is stable. IMPRESSION: No active disease. Electronically Signed   By: Earle Gell M.D.   On: 05/14/2015 13:53   Dg Abd Portable 1v  06/04/2015  CLINICAL DATA:  Hematemesis. EXAM: PORTABLE ABDOMEN - 1 VIEW COMPARISON:  04/29/2015 FINDINGS: No evidence of bowel obstruction or ileus. Tube overlying the right abdomen appears to represent a gastrostomy tube. The region of the stomach is cut off at the top of the film. IMPRESSION: No evidence of bowel obstruction or ileus. Electronically Signed   By: Aletta Edouard M.D.   On: 06/04/2015 17:22   Dg Swallowing Func-speech Pathology  06/06/2015  Objective Swallowing Evaluation:   Patient Details Name: Kerry [name] MRN: [id number] Date of Birth: 12-24-1955 Today's Date: 06/06/2015 Time: SLP Start Time (ACUTE ONLY): 0902-SLP Stop Time (ACUTE ONLY): 0930 SLP Time Calculation  (min) (ACUTE ONLY): 28 min Past Medical History: Past Medical History Diagnosis Date . Hypertension  . Coronary artery disease  . Diabetes mellitus  . Breast cancer ([place]) 03/10/2007   Left breat . Degenerative disc disease, cervical  . Degenerative disc disease, lumbar  . Fibromyalgia  . Hyperlipidemia  . Carpal tunnel syndrome on right  Past Surgical History: Past Surgical History Procedure Laterality Date . Cardiac surgery   . Appendectomy   . Breast surgery   . Mastectomy Left 03/2007 . Cardiac catheterization  2005 . Vein surgery Right 2005 . Tubal ligation Right  . Radiology with anesthesia N/A 04/11/2015   Procedure: RADIOLOGY WITH ANESTHESIA;  Surgeon: Luanne Bras, MD;  Location: [place];  Service: Radiology;  Laterality: N/A; . Cardiac catheterization N/A 04/13/2015   Procedure: Temporary Pacemaker;  Surgeon: Peter M Martinique, MD;  Location: [place] [facility];  Service: Cardiovascular;  Laterality: N/A; . Peg placement N/A 04/18/2015   Procedure: PERCUTANEOUS ENDOSCOPIC GASTROSTOMY (PEG) PLACEMENT;  Surgeon: Judeth Horn, MD;  Location: [place];  Service: General;  Laterality: N/A;  bedside . Esophagogastroduodenoscopy N/A 04/18/2015   Procedure: ESOPHAGOGASTRODUODENOSCOPY (EGD);  Surgeon: Judeth Horn, MD;  Location: [facility] END[facility];  Service: General;  Laterality: N/A; HPI: EMMI GUSTAVE is a 59 y.o. female who was admitted on 04/11/15 with difficulty speaking, right facial and right sided weakness. CT showed dense L-MCA sign and TPA started but she had worsening of right hemiparesis with lethargy. Post procedure developed acute respiratory failure with dense right hemiparesis with global aphasia. She was vent dependent and developed small patchy areas of hemorrhage within acute Large L-MCA infarct as well as cerebral edema. She continued to have lethargy with decrease in LOC requiring PEG placement by Dr. Hulen Skains and Lurline Idol with BAL performed by Dr Nelda Marseille.  She was weaned to trach collar but  continued to have significant secretions requiring frequent suctioning.  She was discharged to [facility] on 11/02 for medical management and rehab. She was decannulated on 11/21.  MBS done 11/14 and she was started on dysphagia 1, honey  liquids on 11/21 and tube feeds ongoing as supplement. She was felt to be a good candidate for intensive rehab program and CIR was recommended for follow up therapy.  Repeat objective swallow study ordered today to determine readiness for diet progression.   No Data Recorded Assessment / Plan / Recommendation CHL IP CLINICAL IMPRESSIONS 06/06/2015 Therapy Diagnosis Mild oral phase dysphagia;Moderate pharyngeal phase dysphagia Clinical Impression Pt presents with improvements in swallowing function in comparison to initial MBS.  Pt now presents with a mild oral phase dysphagia and moderate pharyngeal phase dysphagia with both sensory and motor components.  Oral phase deficits are characterized by right sided labial and lingual weakness which results in weakened manipulation and prolonged oral transit of boluses to the oropharynx.  Decreased pharyngeal sensation and decreased base of tongue retraction also resulted in premature spillage of materials into the pharynx and delayed swallow initiation.  Swallow response was delayed to the vallecula with thickened liquids and solids, and further delayed to the pyriforms with thin liquids.  Delayed swallow initiation resulted in deep, silent penetration of thin liquids.    Pharyngeal weakness also resulted in mild-moderate residuals remaining in the vallecula post swallow.  Vallecular residuals were noted to spill over the epiglottis into the laryngeal vestibule and resulted in high flash penetration of solids during the swallow as well as trace aspiration after the swallow during consumption of thin liquids.   Pt was noted to spontaneously use extra swallows which, in combination with SLP interventions for alternating solids and liquids  effectively minimized the amount of residue in the pharynx.  Recommend diet progression to dys 2 textures and nectar thick liquids with full supervision for use of swallowing precautions.  Pt would also benefit from the water protocol to continue working towards further liquids progression.  Prognosis for advancement good with trials of advanced consistencies, SLP follow up for use of safe swallowing strategies, and repeat objective swallow study in 1-2 weeks.   Impact on safety and function Risk for inadequate nutrition/hydration;Moderate aspiration risk   No flowsheet data found.  CHL IP DIET RECOMMENDATION 06/06/2015 SLP Diet Recommendations Dysphagia 2 (Fine chop) solids;Nectar thick liquid Liquid Administration via Cup Medication Administration Other (Comment) Compensations Minimize environmental distractions;Slow rate;Small sips/bites;Multiple dry swallows after each bite/sip;Follow solids with liquid Postural Changes --   No flowsheet data found.         CHL IP ORAL PHASE 06/06/2015 Oral Phase Impaired Oral - Pudding Teaspoon -- Oral - Pudding Cup -- Oral - Honey Teaspoon -- Oral - Honey Cup Lingual/palatal residue;Decreased bolus cohesion;Weak lingual manipulation;Reduced posterior propulsion;Delayed oral transit;Premature spillage Oral - Nectar Teaspoon -- Oral - Nectar Cup Weak lingual manipulation;Reduced posterior propulsion;Premature spillage;Delayed oral transit;Decreased bolus cohesion;Lingual/palatal residue Oral - Nectar Straw -- Oral - Thin Teaspoon -- Oral - Thin Cup Weak lingual manipulation;Reduced posterior propulsion;Premature spillage;Delayed oral transit;Decreased bolus cohesion;Lingual/palatal residue Oral - Thin Straw Premature spillage;Lingual/palatal residue;Decreased bolus cohesion Oral - Puree Weak lingual manipulation;Reduced posterior propulsion;Lingual/palatal residue;Premature spillage;Delayed oral transit Oral - Mech Soft -- Oral - Regular Weak lingual manipulation;Reduced  posterior propulsion;Lingual/palatal residue;Premature spillage Oral - Multi-Consistency -- Oral - Pill -- Oral Phase - Comment --  CHL IP PHARYNGEAL PHASE 06/06/2015 Pharyngeal Phase Impaired Pharyngeal- Pudding Teaspoon -- Pharyngeal -- Pharyngeal- Pudding Cup -- Pharyngeal -- Pharyngeal- Honey Teaspoon -- Pharyngeal -- Pharyngeal- Honey Cup Delayed swallow initiation-vallecula;Reduced tongue base retraction;Reduced laryngeal elevation;Reduced anterior laryngeal mobility;Pharyngeal residue - valleculae Pharyngeal -- Pharyngeal- Nectar Teaspoon -- Pharyngeal -- Pharyngeal- Nectar Cup Delayed swallow initiation-vallecula;Reduced tongue  base retraction;Reduced anterior laryngeal mobility;Reduced laryngeal elevation;Pharyngeal residue - valleculae Pharyngeal -- Pharyngeal- Nectar Straw -- Pharyngeal -- Pharyngeal- Thin Teaspoon -- Pharyngeal -- Pharyngeal- Thin Cup Reduced airway/laryngeal closure;Reduced tongue base retraction;Reduced anterior laryngeal mobility;Reduced laryngeal elevation;Pharyngeal residue - valleculae;Pharyngeal residue - pyriform;Penetration/Aspiration during swallow;Penetration/Apiration after swallow;Delayed swallow initiation-pyriform sinuses Pharyngeal Material enters airway, CONTACTS cords and not ejected out;Material enters airway, passes BELOW cords without attempt by patient to eject out (silent aspiration) Pharyngeal- Thin Straw Compensatory strategies attempted (with notebox);Penetration/Aspiration during swallow;Reduced airway/laryngeal closure;Reduced anterior laryngeal mobility;Reduced laryngeal elevation;Reduced tongue base retraction;Pharyngeal residue - pyriform;Pharyngeal residue - valleculae Pharyngeal Material enters airway, CONTACTS cords and then ejected out Pharyngeal- Puree Delayed swallow initiation-vallecula;Reduced tongue base retraction;Pharyngeal residue - valleculae;Reduced anterior laryngeal mobility Pharyngeal -- Pharyngeal- Mechanical Soft -- Pharyngeal --  Pharyngeal- Regular Delayed swallow initiation-vallecula;Reduced anterior laryngeal mobility;Reduced tongue base retraction;Pharyngeal residue - valleculae Pharyngeal -- Pharyngeal- Multi-consistency -- Pharyngeal -- Pharyngeal- Pill -- Pharyngeal -- Pharyngeal Comment --  No flowsheet data found. Page, Selinda Orion 06/06/2015, 12:37 PM                    Subjective: Patient denies fevers, chills, headache, chest pain, dyspnea, nausea, vomiting, diarrhea, abdominal pain, dysuria, hematuria   Objective: Filed Vitals:   06/11/15 1959 06/11/15 2108 06/12/15 0415 06/12/15 1055  BP: 120/53 106/53 136/67 132/76  Pulse: 86 84 92   Temp:  98.1 F (36.7 C) 97.7 F (36.5 C)   TempSrc:  Oral Oral   Resp:  18 18   Weight:      SpO2:  100% 100%     Intake/Output Summary (Last 24 hours) at 06/12/15 1416 Last data filed at 06/12/15 0700  Gross per 24 hour  Intake   1024 ml  Output      0 ml  Net   1024 ml   Weight change:  Exam:   General:  Pt is alert, follows commands appropriately, not in acute distress  HEENT: No icterus, No thrush, No neck mass, [place]/AT  Cardiovascular: RRR, S1/S2, no rubs, no gallops  Respiratory: upper airway stridor  Abdomen: Soft/+BS, non tender, non distended, no guarding  Extremities: No edema, No lymphangitis, No petechiae, No rashes, no synovitis  Data Reviewed: Basic Metabolic Panel:  Recent Labs Lab 06/08/15 0559 06/09/15 0321 06/10/15 0400 06/11/15 0434 06/12/15 0640  NA 139 138 139 138 141  K 3.8 5.9* 3.1* 3.4* 3.9  CL 106 103 99* 99* 101  CO2 25 26 31 31 31   GLUCOSE 117* 149* 167* 216* 188*  BUN 6 6 9 9 8   CREATININE 0.44 0.38* 0.31* <0.30* 0.32*  CALCIUM 8.4* 8.4* 8.8* 8.7* 9.4  MG  --  1.7  --   --  1.9   Liver Function Tests:  Recent Labs Lab 06/06/15 1928 06/07/15 0836 06/08/15 0559  AST 20 19 20   ALT 25 21 18   ALKPHOS 74 62 66  BILITOT 0.4 0.3 0.5  PROT 7.0 5.8* 5.7*  ALBUMIN 3.2* 2.8* 2.8*   No results for  input(s): LIPASE, AMYLASE in the last 168 hours. No results for input(s): AMMONIA in the last 168 hours. CBC:  Recent Labs Lab 06/06/15 1928 06/07/15 0836  06/08/15 0559 06/08/15 1234 06/09/15 0828 06/10/15 0400 06/11/15 0434  WBC 15.4* 10.0  < > 11.7* 13.6* 7.7 10.6* 9.5  NEUTROABS 10.5* 5.8  --   --   --   --   --   --   HGB 13.6 11.6*  < > 11.6* 12.2  12.3 11.4* 11.0*  HCT 42.4 37.4  < > 35.9* 37.3 37.3 35.1* 36.1  MCV 86.9 89.3  < > 88.2 86.9 87.4 88.6 89.8  PLT 354 250  < > 229 257 243 240 202  < > = values in this interval not displayed. Cardiac Enzymes:  Recent Labs Lab 06/06/15 1928 06/06/15 2256 06/07/15 0443 06/07/15 1216 06/07/15 1836  TROPONINI 0.46* 0.33* 0.22* 0.18* 0.19*   BNP: Invalid input(s): POCBNP CBG:  Recent Labs Lab 06/11/15 2105 06/12/15 0113 06/12/15 0425 06/12/15 0756 06/12/15 1139  GLUCAP 167* 172* 173* 156* 175*    Recent Results (from the past 240 hour(s))  Culture, blood (x 2)     Status: None   Collection Time: 06/07/15  8:19 AM  Result Value Ref Range Status   Specimen Description BLOOD RIGHT HAND  Final   Special Requests BOTTLES DRAWN AEROBIC ONLY 4ML  Final   Culture NO GROWTH 5 DAYS  Final   Report Status 06/12/2015 FINAL  Final  Culture, blood (x 2)     Status: None   Collection Time: 06/07/15  8:25 AM  Result Value Ref Range Status   Specimen Description BLOOD RIGHT HAND  Final   Special Requests BOTTLES DRAWN AEROBIC ONLY 1ML  Final   Culture NO GROWTH 5 DAYS  Final   Report Status 06/12/2015 FINAL  Final  MRSA PCR Screening     Status: None   Collection Time: 06/08/15  2:14 PM  Result Value Ref Range Status   MRSA by PCR NEGATIVE NEGATIVE Final    Comment:        The GeneXpert MRSA Assay (FDA approved for NASAL specimens only), is one component of a comprehensive MRSA colonization surveillance program. It is not intended to diagnose MRSA infection nor to guide or monitor treatment for MRSA infections.       Scheduled Meds: . antiseptic oral rinse  7 mL Mouth Rinse q12n4p  . atorvastatin  40 mg Oral q1800  . bacitracin   Topical TID  . carvedilol  12.5 mg Oral BID WC  .  ceFAZolin (ANCEF) IV  1 g Intravenous 3 times per day  . chlorhexidine  15 mL Mouth Rinse BID  . feeding supplement (PRO-STAT SUGAR FREE 64)  30 mL Oral Daily  . feeding supplement (VITAL AF 1.2 CAL)  237 mL Per Tube TID  . folic acid  1 mg Oral Daily  . free water  200 mL Per Tube 3 times per day  . insulin aspart  0-9 Units Subcutaneous 6 times per day  . lisinopril  10 mg Oral Daily  . multivitamin with minerals  1 tablet Oral Daily  . nicotine  7 mg Transdermal Daily  . rivaroxaban  15 mg Oral BID WC   Followed by  . [START ON 06/30/2015] rivaroxaban  20 mg Oral Q supper  . sodium chloride  3 mL Intravenous Q12H  . thiamine  100 mg Oral Daily   Continuous Infusions:    Reeya Bound, DO  [facility] Pager [phone]  If 7PM-7AM, please contact night-coverage [email or web address] Password [email or web address] 06/12/2015, 2:16 PM   LOS: 5 days

## 2015-06-12 NOTE — Progress Notes (Signed)
Patient transferred from ICU , patient will need to be seen by speech for nutrition consult.  Patient has a peg tube.  Also Spouse states he would like patient to go back to cir, and per [place]  With [employer] , she is working on it , she will let us know something in am.  NCM explained to Spouse, Kerry [name],  if patient does not go to [employer], she will be going home with [facility], he says he understands.

## 2015-06-12 NOTE — Discharge Summary (Signed)
Physician Discharge Summary  Kerry [name] [id number] DOB: [birth year] DOA: 06/07/2015  PCP: [name], MD  Admit date: 06/07/2015 Discharge date:06/13/15 Recommendations for Outpatient Follow-up:  1. Pt will need to follow up with PCP in 2 weeks post discharge 2. Please obtain BMP and CBC in one week  Discharge Diagnoses:  Acute pulmonary embolism/DVT -As seen on CTA chest -Patient did have transient hypotension which was thought to be likely from sepsis rather than from pulmonary embolism -PCCM was consulted and appreciated, recommended heparin drip prior to transition to Xarelto -Echocardiogram: EF 0000000, grade 1 diastolic dysfunction -Troponin currently trending downward, was 0.46 currently 0.19 -Lower extremity Doppler: Right focal, acute nonocclusive DVT noted in CFV -Spoke with neurology, Dr. [name], regarding [facility] as patient recently received tPA for CVA- states it is fine to start a new agent and Plavix can be discontinued. 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 Recommended humidified nasal cannula oxygen to prevent epistaxis, consider outpatient (larygnologist at [facility] center) referral for laser arytenoidectomy/partial cordotomy once her  current issues improved -Patient currently maintaining her O2 sats on 2L  Sepsis secondary to Urinary tract infection -Patient was noted to have leukocytosis, tachycardia, tachypnea, hypotension and febrile -Patient is being treated with Ancef -switch to cephalexin--completed > 7 days of tx -Urine culture showed pansensitive Escherichia coli  Coffee-ground emesis -Gastroenterology was consulted, Dr. Michail Sermon suspected the epistaxis was the cause. EGD was not indicated at that time. It was okay to resume Plavix on 1214 -Continue Protonix -Hemoglobin has remained stable, currently 11.0 -Continue to monitor  CVA -Status post TPA and embolectomy by interventional radiology -Patient was admitted to inpatient rehabilitation -Patient has right-sided paresis as well as aphasia -Status post PEG placement--continue enteral feeds -Continue statin, xarelto -request repeat speech therapy eval--Dys 2 diet with nectar thickened liquids -continue enteral feeds as pt had poor po intake even prior to ICU transfer - the patient was still eating less than 25% of her meals. Therefore, antral feedings were started to supplement her diet. Certainly, the patient can be reevaluated in the future should her oral intake increase. At that time, her enteral feedings could be discontinued. -enteral feeding provides 53% of kcal, so pt can supplement with any oral intake - patient will be sent home with Vital 1.2 ,  One can 3 times a day with 200 mL free water flush with each can  Altered level of consciousness -Resolved -Overnight 06/08/2015, patient became somewhat unresponsive. -CT head: Normocephalic interval evolution change of the large left MCA, associated edema has largely resolved. No new intracranial process  Hyperlipidemia -Last LDL on 04/12/2015 was 133 -Continue statin  Essential hypertension -Patient was hypotensive which improved with fluid bolus -Continue carvedilol and  lisinopril  Hypokalemia -check mag--1.9 -repleted -Continue to monitor BMP  Leukocytosis -Possibly secondary to steroids -Continue to monitor CBC -resolved Diabetes mellitus type 2 -04/28/2015 hemoglobin A1c 9.7 -  NovoLog sliding scale -pt previously refused insulin due to cost--was only taking glipizide -plan to d/c with lantus 5 units daily with continued insulin sliding scale  Discharge Condition: stable  Disposition:   Diet:dysphagia 2 with nectar thickened liquids and Vital 1.2 enteral feeding Wt Readings from Last 3 Encounters:  06/08/15 80.1 kg (176 lb 9.4 oz)  06/06/15 79.107 kg (174 lb 6.4 oz)  05/16/15 86.183 kg (190 lb)    History of present illness:  59 y.o. female with PMH of hypertension, hyperlipidemia, diabetes mellitus, GERD, CAD, breast cancer (s/P left mastectomy), DDD, carpal tunnel syndrome, recent large left MCA territory infarct with resolving associated hemorrhage after tPA treatment, s/p of PEG placement, right-sided hemiparalysis, currently doing rehab in [place] since 11/23.   Today pt developed tachycardia and hypotension. Cardiology was consulted, Dr. [name] suspected that patient may have PE, which is confirmed by CTA tonight. PCCM was consulted and recommended to start IV heparin. We are asked to admit pt. Pt has aphagia, but can tell that she has shortness of breath and mild chest pain. She does not seem to have abdominal pain or diarrhea. She is currently being treated for UTI with ancef since 12/13. CTA showed subsegmental right lower lobe pulmonary emboli. No evidence of right heart strain. Possible filling defect in the left atrial appendage.  Patient was found to have hypotension with blood pressure 82/46, potassium 3.1, troponin 0.33, lactate 1.1, tachycardia, tachypnea, temperature 99.8, WBC 15.4. Patient is admitted to stepdown bed for further eval and treatment.  Patient admitted from CIR for PE/DVT, was started on heparin and transition to  [place]. Patient is currently being followed by pulmonary. ENT did see patient and recommended outpatient follow-up. PT now recommended CIR  Consultants: PM&R CCM ENT--Dr. Simeon Craft  Discharge Exam: Filed Vitals:   06/13/15 0939 06/13/15 1326  BP: 145/67 149/103  Pulse: 92 92  Temp:    Resp:  18   Filed Vitals:   06/13/15 0503 06/13/15 0925 06/13/15 0939 06/13/15 1326  BP: 148/68  145/67 149/103  Pulse: 83  92 92  Temp: 97.4 F (36.3 C)     TempSrc: Axillary     Resp: 20   18  Height:  5\' 10"  (1.778 m)    Weight:      SpO2: 100%   99%   General: A&O x 3, NAD, pleasant, cooperative Cardiovascular: RRR, no rub, no gallop, no S3 Respiratory: upper airway stridor Abdomen:soft, nontender, nondistended, positive bowel sounds;  PEG site without drainage or erythema Extremities: No edema, No lymphangitis, no petechiae  Discharge Instructions     Medication List    STOP taking these medications        clopidogrel 75 MG tablet  Commonly known as:  PLAVIX      TAKE these medications        acetaminophen 160 MG/5ML solution  Commonly known as:  TYLENOL  Take 10.2-20.3 mLs (325-650 mg total) by mouth every 4 (four) hours as needed for mild pain.     atorvastatin 40 MG tablet  Commonly known as:  LIPITOR  Take 1 tablet (40 mg total) by mouth daily at 6 PM.     carvedilol 12.5 MG tablet  Commonly known as:  COREG  Take 1 tablet (12.5 mg total) by mouth 2 (two) times daily with a meal.     feeding supplement (ENSURE) Pudg  Take 1 Container by mouth 3 (three) times daily between meals.     feeding supplement (VITAL AF 1.2  CAL) Liqd  1 can three times daily; flush with 200 cc water after can is given     folic acid 1 MG tablet  Commonly known as:  FOLVITE  Take 1 mg by mouth daily.     free water Soln  Place 200 mLs into feeding tube every 8 (eight) hours.     glipiZIDE 5 MG tablet  Commonly known as:  GLUCOTROL  Take by mouth daily before breakfast.      hydrocerin Crea  Apply 1 application topically 2 (two) times daily. To bilateral feet     Insulin Glargine 100 UNIT/ML Solostar Pen  Commonly known as:  LANTUS  Inject 5 Units into the skin daily at 10 pm.     Insulin Pen Needle 32G X 4 MM Misc  Use with insulin pen to dispense insulin daily     lisinopril 10 MG tablet  Commonly known as:  PRINIVIL,ZESTRIL  Take 1 tablet (10 mg total) by mouth daily.     Melatonin 3 MG Tabs  Take 6 mg by mouth at bedtime.     multivitamins ther. w/minerals Tabs tablet  Take 1 tablet by mouth daily.     Rivaroxaban 15 MG Tabs tablet  Commonly known as:  XARELTO  Take 1 tablet (15 mg total) by mouth 2 (two) times daily with a meal. X 16 days     rivaroxaban 20 MG Tabs tablet  Commonly known as:  XARELTO  Take 1 tablet (20 mg total) by mouth daily with supper.  Start taking on:  06/30/2015     SENNA PLUS 8.6-50 MG tablet  Generic drug:  senna-docusate  Take 1 tablet by mouth 2 (two) times daily as needed for mild constipation.     sodium chloride 0.65 % Soln nasal spray  Commonly known as:  OCEAN  Place 1 spray into both nostrils 3 (three) times daily. To help with nose bleeds     thiamine 100 MG tablet  Take 100 mg by mouth daily.     zinc sulfate 220 MG capsule  Take 220 mg by mouth daily at 6 PM.         The results of significant diagnostics from this hospitalization (including imaging, microbiology, ancillary and laboratory) are listed below for reference.    Significant Diagnostic Studies: Dg Chest 2 View  06/05/2015  CLINICAL DATA:  Shortness of breath with exertion, personal history of breast cancer, patient smokes EXAM: CHEST  2 VIEW COMPARISON:  05/14/2015 FINDINGS: Stable mild cardiac enlargement. Vascular pattern normal. No consolidation or effusion. IMPRESSION: No active cardiopulmonary disease. Electronically Signed   By: Skipper Cliche M.D.   On: 06/05/2015 10:39   Ct Head Wo Contrast  06/09/2015  CLINICAL DATA:   Initial evaluation for deteriorating mental status. EXAM: CT HEAD WITHOUT CONTRAST TECHNIQUE: Contiguous axial images were obtained from the base of the skull through the vertex without intravenous contrast. COMPARISON:  Prior study from 04/16/2015. FINDINGS: There has been continued interval evolution of large volume left MCA territory infarct, overall stable in size and distribution as compared to previous study. There is increased volume loss and encephalomalacia within the area of infarction. No evidence for associated hemorrhage. Previously seen midline shift has essentially resolved. No new acute large vessel territory infarct. No intracranial hemorrhage. No mass lesion. No hydrocephalus. Basilar cisterns are patent. No extra-axial fluid collection. Mild chronic microvascular ischemic disease noted within the periventricular white matter. Scattered atheromatous plaque within the carotid siphons. Scalp soft tissues within  normal limits. No acute abnormality about the orbits. Paranasal sinuses are clear.  No mastoid effusion. Calvarium intact. IMPRESSION: 1. Normal expected interval evolutional change of large left MCA territory infarct. Associated edema has largely resolved. 2. No new intracranial process. Electronically Signed   By: Jeannine Boga M.D.   On: 06/09/2015 00:47   Ct Angio Chest Pe W/cm &/or Wo Cm  06/07/2015  CLINICAL DATA:  Tachycardia. EKG with sinus tachycardia and T-wave abnormality. EXAM: CT ANGIOGRAPHY CHEST WITH CONTRAST TECHNIQUE: Multidetector CT imaging of the chest was performed using the standard protocol during bolus administration of intravenous contrast. Multiplanar CT image reconstructions and MIPs were obtained to evaluate the vascular anatomy. CONTRAST:  80 mL OMNIPAQUE IOHEXOL 350 MG/ML SOLN COMPARISON:  04/20/2015 FINDINGS: Technically adequate study with good opacification of the central and segmental pulmonary arteries. Filling defects are demonstrated in the right  lower lobe posterior subsegmental branch vessels consistent with pulmonary embolus. This may be acute or chronic. No large central pulmonary emboli. Possible filling defect in the left atrial appendage. Cardiac enlargement with evidence of left ventricular hypertrophy. R 8 RV ratio is less than 1 suggesting no evidence of right heart strain. Small pericardial effusion or thickening. Esophagus is decompressed. No significant lymphadenopathy in the chest. Evaluation of lungs is limited due to respiratory motion artifact. Probable atelectasis in the lung bases. Emphysematous changes in the upper lungs. No pleural effusions. No pneumothorax. Included portions of the upper abdominal organs demonstrate calcified granulomas in the spleen. Degenerative changes in the spine. Review of the MIP images confirms the above findings. IMPRESSION: Subsegmental right lower lobe pulmonary emboli. No evidence of right heart strain. Possible filling defect in the left atrial appendage. Cardiac enlargement with left ventricular hypertrophy. No evidence of active pulmonary disease. These results were called by telephone at the time of interpretation on 06/07/2015 at 12:44 am to [facility], the patient's nurse on MC-4w, who verbally acknowledged these results. Electronically Signed   By: Lucienne Capers M.D.   On: 06/07/2015 00:47   Dg Abd Portable 1v  06/04/2015  CLINICAL DATA:  Hematemesis. EXAM: PORTABLE ABDOMEN - 1 VIEW COMPARISON:  04/29/2015 FINDINGS: No evidence of bowel obstruction or ileus. Tube overlying the right abdomen appears to represent a gastrostomy tube. The region of the stomach is cut off at the top of the film. IMPRESSION: No evidence of bowel obstruction or ileus. Electronically Signed   By: Aletta Edouard M.D.   On: 06/04/2015 17:22   Dg Swallowing Func-speech Pathology  06/06/2015  Objective Swallowing Evaluation:   Patient Details Name: JERI CAMPIONE MRN: [id number] Date of Birth: [birth year] Today's Date:  06/06/2015 Time: SLP Start Time (ACUTE ONLY): 0902-SLP Stop Time (ACUTE ONLY): 0930 SLP Time Calculation (min) (ACUTE ONLY): 28 min Past Medical History: Past Medical History Diagnosis Date . Hypertension  . Coronary artery disease  . Diabetes mellitus  . Breast cancer ([facility]) 03/10/2007   Left breat . Degenerative disc disease, cervical  . Degenerative disc disease, lumbar  . Fibromyalgia  . Hyperlipidemia  . Carpal tunnel syndrome on right  Past Surgical History: Past Surgical History Procedure Laterality Date . Cardiac surgery   . Appendectomy   . Breast surgery   . Mastectomy Left 03/2007 . Cardiac catheterization  2005 . Vein surgery Right 2005 . Tubal ligation Right  . Radiology with anesthesia N/A 04/11/2015   Procedure: RADIOLOGY WITH ANESTHESIA;  Surgeon: Luanne Bras, MD;  Location: [facility];  Service: Radiology;  Laterality: N/A; . Cardiac catheterization N/A  04/13/2015   Procedure: Temporary Pacemaker;  Surgeon: Peter M Martinique, MD;  Location: [place] [facility];  Service: Cardiovascular;  Laterality: N/A; . Peg placement N/A 04/18/2015   Procedure: PERCUTANEOUS ENDOSCOPIC GASTROSTOMY (PEG) PLACEMENT;  Surgeon: Judeth Horn, MD;  Location: [place];  Service: General;  Laterality: N/A;  bedside . Esophagogastroduodenoscopy N/A 04/18/2015   Procedure: ESOPHAGOGASTRODUODENOSCOPY (EGD);  Surgeon: Judeth Horn, MD;  Location: [facility] ENDOSCOPY;  Service: General;  Laterality: N/A; HPI: LEXANY PELLMAN is a 59 y.o. female who was admitted on 04/11/15 with difficulty speaking, right facial and right sided weakness. CT showed dense L-MCA sign and TPA started but she had worsening of right hemiparesis with lethargy. Post procedure developed acute respiratory failure with dense right hemiparesis with global aphasia. She was vent dependent and developed small patchy areas of hemorrhage within acute Large L-MCA infarct as well as cerebral edema. She continued to have lethargy with decrease in LOC requiring PEG  placement by Dr. Hulen Skains and Lurline Idol with BAL performed by Dr Nelda Marseille.  She was weaned to trach collar but continued to have significant secretions requiring frequent suctioning.  She was discharged to [facility] on 11/[facility] for medical management and rehab. She was decannulated on 11/21.  MBS done 11/14 and she was started on dysphagia 1, honey liquids on 11/21 and tube feeds ongoing as supplement. She was felt to be a good candidate for intensive rehab program and CIR was recommended for follow up therapy.  Repeat objective swallow study ordered today to determine readiness for diet progression.   No Data Recorded Assessment / Plan / Recommendation CHL IP CLINICAL IMPRESSIONS 06/06/2015 Therapy Diagnosis Mild oral phase dysphagia;Moderate pharyngeal phase dysphagia Clinical Impression Pt presents with improvements in swallowing function in comparison to initial MBS.  Pt now presents with a mild oral phase dysphagia and moderate pharyngeal phase dysphagia with both sensory and motor components.  Oral phase deficits are characterized by right sided labial and lingual weakness which results in weakened manipulation and prolonged oral transit of boluses to the oropharynx.  Decreased pharyngeal sensation and decreased base of tongue retraction also resulted in premature spillage of materials into the pharynx and delayed swallow initiation.  Swallow response was delayed to the vallecula with thickened liquids and solids, and further delayed to the pyriforms with thin liquids.  Delayed swallow initiation resulted in deep, silent penetration of thin liquids.    Pharyngeal weakness also resulted in mild-moderate residuals remaining in the vallecula post swallow.  Vallecular residuals were noted to spill over the epiglottis into the laryngeal vestibule and resulted in high flash penetration of solids during the swallow as well as trace aspiration after the swallow during consumption of thin liquids.   Pt was noted to spontaneously use  extra swallows which, in combination with SLP interventions for alternating solids and liquids effectively minimized the amount of residue in the pharynx.  Recommend diet progression to dys 2 textures and nectar thick liquids with full supervision for use of swallowing precautions.  Pt would also benefit from the water protocol to continue working towards further liquids progression.  Prognosis for advancement good with trials of advanced consistencies, SLP follow up for use of safe swallowing strategies, and repeat objective swallow study in 1-2 weeks.   Impact on safety and function Risk for inadequate nutrition/hydration;Moderate aspiration risk   No flowsheet data found.  CHL IP DIET RECOMMENDATION 06/06/2015 SLP Diet Recommendations Dysphagia 2 (Fine chop) solids;Nectar thick liquid Liquid Administration via Cup Medication Administration Other (Comment) Compensations  Minimize environmental distractions;Slow rate;Small sips/bites;Multiple dry swallows after each bite/sip;Follow solids with liquid Postural Changes --   No flowsheet data found.         CHL IP ORAL PHASE 06/06/2015 Oral Phase Impaired Oral - Pudding Teaspoon -- Oral - Pudding Cup -- Oral - Honey Teaspoon -- Oral - Honey Cup Lingual/palatal residue;Decreased bolus cohesion;Weak lingual manipulation;Reduced posterior propulsion;Delayed oral transit;Premature spillage Oral - Nectar Teaspoon -- Oral - Nectar Cup Weak lingual manipulation;Reduced posterior propulsion;Premature spillage;Delayed oral transit;Decreased bolus cohesion;Lingual/palatal residue Oral - Nectar Straw -- Oral - Thin Teaspoon -- Oral - Thin Cup Weak lingual manipulation;Reduced posterior propulsion;Premature spillage;Delayed oral transit;Decreased bolus cohesion;Lingual/palatal residue Oral - Thin Straw Premature spillage;Lingual/palatal residue;Decreased bolus cohesion Oral - Puree Weak lingual manipulation;Reduced posterior propulsion;Lingual/palatal residue;Premature  spillage;Delayed oral transit Oral - Mech Soft -- Oral - Regular Weak lingual manipulation;Reduced posterior propulsion;Lingual/palatal residue;Premature spillage Oral - Multi-Consistency -- Oral - Pill -- Oral Phase - Comment --  CHL IP PHARYNGEAL PHASE 06/06/2015 Pharyngeal Phase Impaired Pharyngeal- Pudding Teaspoon -- Pharyngeal -- Pharyngeal- Pudding Cup -- Pharyngeal -- Pharyngeal- Honey Teaspoon -- Pharyngeal -- Pharyngeal- Honey Cup Delayed swallow initiation-vallecula;Reduced tongue base retraction;Reduced laryngeal elevation;Reduced anterior laryngeal mobility;Pharyngeal residue - valleculae Pharyngeal -- Pharyngeal- Nectar Teaspoon -- Pharyngeal -- Pharyngeal- Nectar Cup Delayed swallow initiation-vallecula;Reduced tongue base retraction;Reduced anterior laryngeal mobility;Reduced laryngeal elevation;Pharyngeal residue - valleculae Pharyngeal -- Pharyngeal- Nectar Straw -- Pharyngeal -- Pharyngeal- Thin Teaspoon -- Pharyngeal -- Pharyngeal- Thin Cup Reduced airway/laryngeal closure;Reduced tongue base retraction;Reduced anterior laryngeal mobility;Reduced laryngeal elevation;Pharyngeal residue - valleculae;Pharyngeal residue - pyriform;Penetration/Aspiration during swallow;Penetration/Apiration after swallow;Delayed swallow initiation-pyriform sinuses Pharyngeal Material enters airway, CONTACTS cords and not ejected out;Material enters airway, passes BELOW cords without attempt by patient to eject out (silent aspiration) Pharyngeal- Thin Straw Compensatory strategies attempted (with notebox);Penetration/Aspiration during swallow;Reduced airway/laryngeal closure;Reduced anterior laryngeal mobility;Reduced laryngeal elevation;Reduced tongue base retraction;Pharyngeal residue - pyriform;Pharyngeal residue - valleculae Pharyngeal Material enters airway, CONTACTS cords and then ejected out Pharyngeal- Puree Delayed swallow initiation-vallecula;Reduced tongue base retraction;Pharyngeal residue -  valleculae;Reduced anterior laryngeal mobility Pharyngeal -- Pharyngeal- Mechanical Soft -- Pharyngeal -- Pharyngeal- Regular Delayed swallow initiation-vallecula;Reduced anterior laryngeal mobility;Reduced tongue base retraction;Pharyngeal residue - valleculae Pharyngeal -- Pharyngeal- Multi-consistency -- Pharyngeal -- Pharyngeal- Pill -- Pharyngeal -- Pharyngeal Comment --  No flowsheet data found. Page, Selinda Orion 06/06/2015, 12:37 PM                Microbiology: Recent Results (from the past 240 hour(s))  Culture, blood (x 2)     Status: None   Collection Time: 06/07/15  8:19 AM  Result Value Ref Range Status   Specimen Description BLOOD RIGHT HAND  Final   Special Requests BOTTLES DRAWN AEROBIC ONLY 4ML  Final   Culture NO GROWTH 5 DAYS  Final   Report Status 06/12/2015 FINAL  Final  Culture, blood (x 2)     Status: None   Collection Time: 06/07/15  8:25 AM  Result Value Ref Range Status   Specimen Description BLOOD RIGHT HAND  Final   Special Requests BOTTLES DRAWN AEROBIC ONLY 1ML  Final   Culture NO GROWTH 5 DAYS  Final   Report Status 06/12/2015 FINAL  Final  MRSA PCR Screening     Status: None   Collection Time: 06/08/15  2:14 PM  Result Value Ref Range Status   MRSA by PCR NEGATIVE NEGATIVE Final    Comment:        The GeneXpert MRSA Assay (FDA approved for NASAL specimens only), is one component of  a comprehensive MRSA colonization surveillance program. It is not intended to diagnose MRSA infection nor to guide or monitor treatment for MRSA infections.      Labs: Basic Metabolic Panel:  Recent Labs Lab 06/08/15 0559 06/09/15 0321 06/10/15 0400 06/11/15 0434 06/12/15 0640  NA 139 138 139 138 141  K 3.8 5.9* 3.1* 3.4* 3.9  CL 106 103 99* 99* 101  CO2 25 26 31 31 31   GLUCOSE 117* 149* 167* 216* 188*  BUN 6 6 9 9 8   CREATININE 0.44 0.38* 0.31* <0.30* 0.32*  CALCIUM 8.4* 8.4* 8.8* 8.7* 9.4  MG  --  1.7  --   --  1.9   Liver Function Tests:  Recent  Labs Lab 06/06/15 1928 06/07/15 0836 06/08/15 0559  AST 20 19 20   ALT 25 21 18   ALKPHOS 74 62 66  BILITOT 0.4 0.3 0.5  PROT 7.0 5.8* 5.7*  ALBUMIN 3.2* 2.8* 2.8*   No results for input(s): LIPASE, AMYLASE in the last 168 hours. No results for input(s): AMMONIA in the last 168 hours. CBC:  Recent Labs Lab 06/06/15 1928 06/07/15 0836  06/08/15 0559 06/08/15 1234 06/09/15 0828 06/10/15 0400 06/11/15 0434  WBC 15.4* 10.0  < > 11.7* 13.6* 7.7 10.6* 9.5  NEUTROABS 10.5* 5.8  --   --   --   --   --   --   HGB 13.6 11.6*  < > 11.6* 12.2 12.3 11.4* 11.0*  HCT 42.4 37.4  < > 35.9* 37.3 37.3 35.1* 36.1  MCV 86.9 89.3  < > 88.2 86.9 87.4 88.6 89.8  PLT 354 250  < > 229 257 243 240 202  < > = values in this interval not displayed. Cardiac Enzymes:  Recent Labs Lab 06/06/15 1928 06/06/15 2256 06/07/15 0443 06/07/15 1216 06/07/15 1836  TROPONINI 0.46* 0.33* 0.22* 0.18* 0.19*   BNP: Invalid input(s): POCBNP CBG:  Recent Labs Lab 06/13/15 0012 06/13/15 0445 06/13/15 0806 06/13/15 0924 06/13/15 1219  GLUCAP 171* 172* 163* 179* 156*    Time coordinating discharge:  Greater than 30 minutes  Signed:  Jahmal Dunavant, DO [facility] Pager: [phone] 06/13/2015, 1:56 PM

## 2015-06-13 ENCOUNTER — Encounter (HOSPITAL_COMMUNITY): Payer: Self-pay | Admitting: Physical Medicine & Rehabilitation

## 2015-06-13 ENCOUNTER — Inpatient Hospital Stay (HOSPITAL_COMMUNITY)
Admission: RE | Admit: 2015-06-13 | Discharge: 2015-06-19 | DRG: 056 | Disposition: A | Discharge status: Other Acute Inpatient Hospital | Payer: 59 | Source: Intra-hospital | Attending: Physical Medicine & Rehabilitation | Admitting: Physical Medicine & Rehabilitation

## 2015-06-13 DIAGNOSIS — Z978 Presence of other specified devices: Secondary | ICD-10-CM

## 2015-06-13 DIAGNOSIS — E1100 Type 2 diabetes mellitus with hyperosmolarity without nonketotic hyperglycemic-hyperosmolar coma (NKHHC): Secondary | ICD-10-CM | POA: Diagnosis present

## 2015-06-13 DIAGNOSIS — G894 Chronic pain syndrome: Secondary | ICD-10-CM | POA: Diagnosis present

## 2015-06-13 DIAGNOSIS — J3802 Paralysis of vocal cords and larynx, bilateral: Secondary | ICD-10-CM | POA: Diagnosis present

## 2015-06-13 DIAGNOSIS — Z885 Allergy status to narcotic agent status: Secondary | ICD-10-CM | POA: Diagnosis not present

## 2015-06-13 DIAGNOSIS — Z9119 Patient's noncompliance with other medical treatment and regimen: Secondary | ICD-10-CM | POA: Diagnosis not present

## 2015-06-13 DIAGNOSIS — E785 Hyperlipidemia, unspecified: Secondary | ICD-10-CM | POA: Diagnosis present

## 2015-06-13 DIAGNOSIS — A419 Sepsis, unspecified organism: Secondary | ICD-10-CM | POA: Diagnosis not present

## 2015-06-13 DIAGNOSIS — I2699 Other pulmonary embolism without acute cor pulmonale: Secondary | ICD-10-CM | POA: Diagnosis present

## 2015-06-13 DIAGNOSIS — R4701 Aphasia: Secondary | ICD-10-CM | POA: Diagnosis present

## 2015-06-13 DIAGNOSIS — I69391 Dysphagia following cerebral infarction: Secondary | ICD-10-CM | POA: Diagnosis not present

## 2015-06-13 DIAGNOSIS — I69351 Hemiplegia and hemiparesis following cerebral infarction affecting right dominant side: Principal | ICD-10-CM

## 2015-06-13 DIAGNOSIS — Z7984 Long term (current) use of oral hypoglycemic drugs: Secondary | ICD-10-CM | POA: Diagnosis not present

## 2015-06-13 DIAGNOSIS — I639 Cerebral infarction, unspecified: Secondary | ICD-10-CM | POA: Diagnosis present

## 2015-06-13 DIAGNOSIS — F411 Generalized anxiety disorder: Secondary | ICD-10-CM | POA: Diagnosis not present

## 2015-06-13 DIAGNOSIS — M797 Fibromyalgia: Secondary | ICD-10-CM | POA: Diagnosis present

## 2015-06-13 DIAGNOSIS — E1165 Type 2 diabetes mellitus with hyperglycemia: Secondary | ICD-10-CM | POA: Diagnosis present

## 2015-06-13 DIAGNOSIS — G8100 Flaccid hemiplegia affecting unspecified side: Secondary | ICD-10-CM

## 2015-06-13 DIAGNOSIS — I10 Essential (primary) hypertension: Secondary | ICD-10-CM

## 2015-06-13 DIAGNOSIS — Z7902 Long term (current) use of antithrombotics/antiplatelets: Secondary | ICD-10-CM | POA: Diagnosis not present

## 2015-06-13 DIAGNOSIS — I69320 Aphasia following cerebral infarction: Secondary | ICD-10-CM

## 2015-06-13 DIAGNOSIS — R0689 Other abnormalities of breathing: Secondary | ICD-10-CM | POA: Insufficient documentation

## 2015-06-13 DIAGNOSIS — Z79899 Other long term (current) drug therapy: Secondary | ICD-10-CM | POA: Diagnosis not present

## 2015-06-13 DIAGNOSIS — D72829 Elevated white blood cell count, unspecified: Secondary | ICD-10-CM | POA: Insufficient documentation

## 2015-06-13 DIAGNOSIS — Z0189 Encounter for other specified special examinations: Secondary | ICD-10-CM

## 2015-06-13 DIAGNOSIS — I119 Hypertensive heart disease without heart failure: Secondary | ICD-10-CM | POA: Diagnosis present

## 2015-06-13 DIAGNOSIS — R0602 Shortness of breath: Secondary | ICD-10-CM

## 2015-06-13 DIAGNOSIS — I69359 Hemiplegia and hemiparesis following cerebral infarction affecting unspecified side: Secondary | ICD-10-CM

## 2015-06-13 DIAGNOSIS — I63312 Cerebral infarction due to thrombosis of left middle cerebral artery: Secondary | ICD-10-CM | POA: Diagnosis not present

## 2015-06-13 DIAGNOSIS — Z853 Personal history of malignant neoplasm of breast: Secondary | ICD-10-CM | POA: Diagnosis not present

## 2015-06-13 DIAGNOSIS — R000 Tachycardia, unspecified: Secondary | ICD-10-CM | POA: Insufficient documentation

## 2015-06-13 DIAGNOSIS — E1151 Type 2 diabetes mellitus with diabetic peripheral angiopathy without gangrene: Secondary | ICD-10-CM

## 2015-06-13 DIAGNOSIS — I2692 Saddle embolus of pulmonary artery without acute cor pulmonale: Secondary | ICD-10-CM

## 2015-06-13 DIAGNOSIS — I2510 Atherosclerotic heart disease of native coronary artery without angina pectoris: Secondary | ICD-10-CM | POA: Diagnosis present

## 2015-06-13 DIAGNOSIS — E1159 Type 2 diabetes mellitus with other circulatory complications: Secondary | ICD-10-CM | POA: Diagnosis present

## 2015-06-13 DIAGNOSIS — F17200 Nicotine dependence, unspecified, uncomplicated: Secondary | ICD-10-CM | POA: Diagnosis present

## 2015-06-13 HISTORY — DX: Essential (primary) hypertension: I10

## 2015-06-13 LAB — GLUCOSE, CAPILLARY
GLUCOSE-CAPILLARY: 156 mg/dL — AB (ref 65–99)
Glucose-Capillary: 147 mg/dL — ABNORMAL HIGH (ref 65–99)
Glucose-Capillary: 163 mg/dL — ABNORMAL HIGH (ref 65–99)
Glucose-Capillary: 171 mg/dL — ABNORMAL HIGH (ref 65–99)
Glucose-Capillary: 172 mg/dL — ABNORMAL HIGH (ref 65–99)
Glucose-Capillary: 179 mg/dL — ABNORMAL HIGH (ref 65–99)

## 2015-06-13 MED ORDER — CARVEDILOL 12.5 MG PO TABS: 12.5000 mg | ORAL_TABLET | Freq: Two times a day (BID) | ORAL | Status: DC

## 2015-06-13 MED ORDER — BISACODYL 10 MG RE SUPP
10.0000 mg | Freq: Every day | RECTAL | Status: DC | PRN
  Administered 2015-06-18: 10 mg via RECTAL
  Filled 2015-06-13: qty 1

## 2015-06-13 MED ORDER — PROCHLORPERAZINE EDISYLATE 5 MG/ML IJ SOLN
5.0000 mg | Freq: Four times a day (QID) | INTRAMUSCULAR | Status: DC | PRN
Start: 2015-06-13 — End: 2015-06-19

## 2015-06-13 MED ORDER — PRO-STAT SUGAR FREE PO LIQD
30.0000 mL | Freq: Every day | ORAL | Status: DC
  Administered 2015-06-14 – 2015-06-18 (×5): 30 mL via ORAL
  Filled 2015-06-13 (×5): qty 30

## 2015-06-13 MED ORDER — BISACODYL 10 MG RE SUPP: 10.0000 mg | Freq: Every day | RECTAL | Status: DC | PRN

## 2015-06-13 MED ORDER — VITAL AF 1.2 CAL PO LIQD: ORAL | Status: DC

## 2015-06-13 MED ORDER — TRAZODONE HCL 50 MG PO TABS: 25.0000 mg | ORAL_TABLET | Freq: Every evening | ORAL | Status: DC | PRN

## 2015-06-13 MED ORDER — ALUM & MAG HYDROXIDE-SIMETH 200-200-20 MG/5ML PO SUSP: 30.0000 mL | ORAL | Status: DC | PRN

## 2015-06-13 MED ORDER — DIPHENHYDRAMINE HCL 12.5 MG/5ML PO ELIX: 12.5000 mg | ORAL_SOLUTION | Freq: Four times a day (QID) | ORAL | Status: DC | PRN

## 2015-06-13 MED ORDER — LISINOPRIL 10 MG PO TABS: 10.0000 mg | ORAL_TABLET | Freq: Every day | ORAL | Status: DC

## 2015-06-13 MED ORDER — INSULIN DETEMIR 100 UNIT/ML ~~LOC~~ SOLN
5.0000 [IU] | Freq: Every day | SUBCUTANEOUS | Status: DC
  Administered 2015-06-14 – 2015-06-15 (×2): 5 [IU] via SUBCUTANEOUS
  Filled 2015-06-13 (×3): qty 0.05

## 2015-06-13 MED ORDER — FOLIC ACID 1 MG PO TABS
1.0000 mg | ORAL_TABLET | Freq: Every day | ORAL | Status: DC
  Administered 2015-06-14 – 2015-06-18 (×5): 1 mg via ORAL
  Filled 2015-06-13 (×5): qty 1

## 2015-06-13 MED ORDER — SENNOSIDES-DOCUSATE SODIUM 8.6-50 MG PO TABS
1.0000 | ORAL_TABLET | Freq: Two times a day (BID) | ORAL | Status: DC | PRN
  Administered 2015-06-18: 1 via ORAL
  Filled 2015-06-13: qty 1

## 2015-06-13 MED ORDER — LISINOPRIL 10 MG PO TABS
10.0000 mg | ORAL_TABLET | Freq: Every day | ORAL | Status: DC
  Administered 2015-06-14 – 2015-06-18 (×5): 10 mg via ORAL
  Filled 2015-06-13 (×5): qty 1

## 2015-06-13 MED ORDER — CEPHALEXIN 250 MG/5ML PO SUSR
500.0000 mg | Freq: Four times a day (QID) | ORAL | Status: AC
  Administered 2015-06-13 – 2015-06-14 (×2): 500 mg
  Filled 2015-06-13 (×2): qty 10

## 2015-06-13 MED ORDER — INSULIN ASPART 100 UNIT/ML ~~LOC~~ SOLN
0.0000 [IU] | Freq: Every day | SUBCUTANEOUS | Status: DC
  Administered 2015-06-13 – 2015-06-14 (×2): 2 [IU] via SUBCUTANEOUS
  Administered 2015-06-15: 3 [IU] via SUBCUTANEOUS
  Administered 2015-06-16: 2 [IU] via SUBCUTANEOUS
  Administered 2015-06-17: 3 [IU] via SUBCUTANEOUS
  Administered 2015-06-18: 5 [IU] via SUBCUTANEOUS

## 2015-06-13 MED ORDER — RIVAROXABAN 15 MG PO TABS
15.0000 mg | ORAL_TABLET | Freq: Two times a day (BID) | ORAL | Status: DC
  Administered 2015-06-14: 15 mg via ORAL
  Filled 2015-06-13: qty 1

## 2015-06-13 MED ORDER — VITAMIN B-1 100 MG PO TABS
100.0000 mg | ORAL_TABLET | Freq: Every day | ORAL | Status: DC
  Administered 2015-06-14 – 2015-06-18 (×5): 100 mg via ORAL
  Filled 2015-06-13 (×5): qty 1

## 2015-06-13 MED ORDER — INSULIN PEN NEEDLE 32G X 4 MM MISC: Status: DC

## 2015-06-13 MED ORDER — RIVAROXABAN 15 MG PO TABS: 15.0000 mg | ORAL_TABLET | Freq: Two times a day (BID) | ORAL | Status: DC

## 2015-06-13 MED ORDER — AYR SALINE NASAL NA GEL: 1.0000 "application " | Freq: Three times a day (TID) | NASAL | Status: DC

## 2015-06-13 MED ORDER — NITROGLYCERIN 0.4 MG SL SUBL: 0.4000 mg | SUBLINGUAL_TABLET | SUBLINGUAL | Status: DC | PRN

## 2015-06-13 MED ORDER — TRAMADOL HCL 50 MG PO TABS
25.0000 mg | ORAL_TABLET | Freq: Four times a day (QID) | ORAL | Status: DC | PRN
  Administered 2015-06-16 – 2015-06-18 (×5): 25 mg via ORAL
  Filled 2015-06-13 (×5): qty 1

## 2015-06-13 MED ORDER — ATORVASTATIN CALCIUM 40 MG PO TABS: 40.0000 mg | ORAL_TABLET | Freq: Every day | ORAL | Status: DC

## 2015-06-13 MED ORDER — PROCHLORPERAZINE MALEATE 5 MG PO TABS: 5.0000 mg | ORAL_TABLET | Freq: Four times a day (QID) | ORAL | Status: DC | PRN

## 2015-06-13 MED ORDER — INSULIN GLARGINE 100 UNIT/ML SOLOSTAR PEN: 5.0000 [IU] | PEN_INJECTOR | Freq: Every day | SUBCUTANEOUS | Status: DC

## 2015-06-13 MED ORDER — ATORVASTATIN CALCIUM 40 MG PO TABS
40.0000 mg | ORAL_TABLET | Freq: Every day | ORAL | Status: DC
  Administered 2015-06-14 – 2015-06-18 (×5): 40 mg via ORAL
  Filled 2015-06-13 (×5): qty 1

## 2015-06-13 MED ORDER — RIVAROXABAN 20 MG PO TABS: 20.0000 mg | ORAL_TABLET | Freq: Every day | ORAL | Status: DC

## 2015-06-13 MED ORDER — CARVEDILOL 12.5 MG PO TABS
12.5000 mg | ORAL_TABLET | Freq: Two times a day (BID) | ORAL | Status: DC
  Administered 2015-06-14 – 2015-06-15 (×3): 12.5 mg via ORAL
  Filled 2015-06-13 (×3): qty 1

## 2015-06-13 MED ORDER — FLEET ENEMA 7-19 GM/118ML RE ENEM: 1.0000 | ENEMA | Freq: Once | RECTAL | Status: DC | PRN

## 2015-06-13 MED ORDER — GUAIFENESIN-DM 100-10 MG/5ML PO SYRP: 5.0000 mL | ORAL_SOLUTION | Freq: Four times a day (QID) | ORAL | Status: DC | PRN

## 2015-06-13 MED ORDER — ADULT MULTIVITAMIN W/MINERALS CH
1.0000 | ORAL_TABLET | Freq: Every day | ORAL | Status: DC
  Administered 2015-06-14 – 2015-06-18 (×5): 1 via ORAL
  Filled 2015-06-13 (×5): qty 1

## 2015-06-13 MED ORDER — INSULIN ASPART 100 UNIT/ML ~~LOC~~ SOLN
0.0000 [IU] | Freq: Three times a day (TID) | SUBCUTANEOUS | Status: DC
  Administered 2015-06-14: 3 [IU] via SUBCUTANEOUS
  Administered 2015-06-14: 2 [IU] via SUBCUTANEOUS
  Administered 2015-06-14 – 2015-06-15 (×2): 3 [IU] via SUBCUTANEOUS
  Administered 2015-06-15 (×2): 5 [IU] via SUBCUTANEOUS
  Administered 2015-06-16 (×2): 3 [IU] via SUBCUTANEOUS
  Administered 2015-06-16: 2 [IU] via SUBCUTANEOUS
  Administered 2015-06-17: 3 [IU] via SUBCUTANEOUS
  Administered 2015-06-17: 2 [IU] via SUBCUTANEOUS
  Administered 2015-06-17: 3 [IU] via SUBCUTANEOUS
  Administered 2015-06-18: 5 [IU] via SUBCUTANEOUS
  Administered 2015-06-18: 3 [IU] via SUBCUTANEOUS
  Administered 2015-06-18: 2 [IU] via SUBCUTANEOUS

## 2015-06-13 MED ORDER — IPRATROPIUM-ALBUTEROL 0.5-2.5 (3) MG/3ML IN SOLN
3.0000 mL | Freq: Four times a day (QID) | RESPIRATORY_TRACT | Status: DC | PRN
  Administered 2015-06-16 – 2015-06-18 (×2): 3 mL via RESPIRATORY_TRACT
  Filled 2015-06-13 (×2): qty 3

## 2015-06-13 MED ORDER — PROCHLORPERAZINE 25 MG RE SUPP: 12.5000 mg | Freq: Four times a day (QID) | RECTAL | Status: DC | PRN

## 2015-06-13 MED ORDER — FAMOTIDINE 20 MG PO TABS
20.0000 mg | ORAL_TABLET | Freq: Two times a day (BID) | ORAL | Status: DC
  Administered 2015-06-13 – 2015-06-18 (×11): 20 mg via ORAL
  Filled 2015-06-13 (×11): qty 1

## 2015-06-13 MED ORDER — VITAL AF 1.2 CAL PO LIQD
237.0000 mL | Freq: Three times a day (TID) | ORAL | Status: DC
  Administered 2015-06-14 – 2015-06-18 (×14): 237 mL
  Filled 2015-06-13 (×5): qty 237

## 2015-06-13 MED ORDER — FREE WATER
200.0000 mL | Freq: Three times a day (TID) | Status: DC
  Administered 2015-06-13 – 2015-06-18 (×21): 200 mL

## 2015-06-13 NOTE — Progress Notes (Signed)
PMR Admission Coordinator Pre-Admission Assessment  Patient: Kerry [name] is an 59 y.o., female MRN: [id number] DOB: 07/20/1955 Height: 5' 10" (177.8 cm) Weight: 80.1 kg (176 lb 9.4 oz)  Insurance Information HMO: yes PPO: PCP: IPA: 80/20: OTHER: Compass navigate PRIMARY: [employer] Compass Policy#: [id number] Subscriber: pt CM Name: Shanelle Phone#: [phone] Fax#: [phone] fu with Jil bryant at phone [phone] fax [phone] auth for 7 days Pre-Cert#: [id number] Employer: unemployed Benefits: Phone #: [phone] Name: 06/13/15 Heidi Eff. Date: 06/23/14 Deduct: $3600/met all Out of Pocket Max: $5000/met all Life Max: none CIR: 80% until OOP met then covers 100% SNF: 80% 60 days Outpatient: $20 copay per visit Co-Pay: 23 visits PT, 23 visits OT, 30 visits SLP Home Health: 80% Co-Pay: 20% no visit limit DME: 80% Co-Pay: 20% Providers: in network visits only  SECONDARY: none   Medicaid Application Date: Case Manager:  Disability Application Date: Case Worker:   Emergency Contact Information Contact Information    Name Relation Home Work Mobile   Liddell,Thomas Spouse [phone]  [phone]   Deeg,Matt Son   [phone]   Boughner,Jeremiah Son   [phone]     Current Medical History  Patient Admitting Diagnosis: ICH, saddle PE/DVT  History of Present Illness: 59 y.o. female with PMH of hypertension, hyperlipidemia, diabetes mellitus, GERD, CAD, breast cancer (s/P left mastectomy), DDD, carpal tunnel syndrome, recent large left MCA territory infarct with resolving associated hemorrhage after tPA treatment, s/p of PEG placement, right-sided hemiparalysis, began inpt acute rehab  11/23. Where she was admitted from [facility].  On 06/07/15 pt developed tachycardia and hypotension. Cardiology was consulted, Dr. Branch suspected that patient may have PE, which was confirmed by CTA. PCCM was consulted and recommended to start IV heparin. [facility] admitted pt to acute hospital. Pt had aphasia, but could tell that she was shortness of breath and had mild chest pain. She was being treated for UTI with ancef since 12/13. CTA showed subsegmental right lower lobe pulmonary emboli. No evidence of right heart strain. Possible filling defect in the left atrial appendage.  Patient was found to have hypotension with blood pressure 82/46, potassium 3.1, troponin 0.33, lactate 1.1, tachycardia, tachypnea, temperature 99.8, WBC 15.4. Patient was admitted to stepdown bed for further eval and treatment.  Patient admitted from CIR for PE/DVT, was started on heparin and transition to Xarelto. ENT did see patient 06/09/15 and recommended outpatient follow-up for bilateral vocal fold paresis/hypomobility. Felt to be a sequela of her recent CVA. Since she does not appear to be in any respiratory distress from this and is able to lie flat with no difficulty, and she passed her swallow eval, can likely just observe with oxygen as needed. Can also see bilateral vocal fold paralysis from brain edema and cerebellar tonsillar herniation as in Chiari malformation so MRI or CT head may be indicated. Would humidify the nasal cannula oxygen to prevent epistaxis. Once she is more stable can consider outpatient referral to one of the Laryngologists at one of the the Universities to consider laser arytenoidectomy/partial cordotomy which would give her a larger laryngeal airway if her stridor was persistent.    Past Medical History  Past Medical History  Diagnosis Date  . Hypertension   . Coronary artery disease   . Diabetes mellitus   . Breast cancer (HCC) 03/10/2007    Left breat   . Degenerative disc disease, cervical   . Degenerative disc disease, lumbar   . Fibromyalgia   . Hyperlipidemia   . Carpal   tunnel syndrome on right   . PE (pulmonary embolism)   . UTI (lower urinary tract infection)   . GIB (gastrointestinal bleeding)   . Elevated troponin     Family History  family history includes Cancer in her cousin, maternal aunt, maternal aunt, and mother; Diabetes in her sister.  Prior Rehab/Hospitalizations:  Has the patient had major surgery during 100 days prior to admission? Yes  Current Medications   Current facility-administered medications:  . acetaminophen (TYLENOL) suppository 650 mg, 650 mg, Rectal, Q6H PRN, Ivor Costa, MD . antiseptic oral rinse (CPC / CETYLPYRIDINIUM CHLORIDE 0.05%) solution 7 mL, 7 mL, Mouth Rinse, q12n4p, Ivor Costa, MD, 7 mL at 06/12/15 1544 . atorvastatin (LIPITOR) tablet 40 mg, 40 mg, Oral, q1800, Ivor Costa, MD, 40 mg at 06/12/15 1716 . bacitracin ointment, , Topical, TID, [name], MD . bisacodyl (DULCOLAX) suppository 10 mg, 10 mg, Rectal, Daily PRN, Ivor Costa, MD . carvedilol (COREG) tablet 12.5 mg, 12.5 mg, Oral, BID WC, Maryann Mikhail, DO, 12.5 mg at 06/13/15 0941 . cephALEXin (KEFLEX) 250 MG/5ML suspension 500 mg, 500 mg, Per Tube, 4 times per day, Orson Eva, MD, 500 mg at 06/13/15 0505 . chlorhexidine (PERIDEX) 0.12 % solution 15 mL, 15 mL, Mouth Rinse, BID, Ivor Costa, MD, 15 mL at 06/13/15 [phone] . feeding supplement (PRO-STAT SUGAR FREE 64) liquid 30 mL, 30 mL, Oral, Daily, Ivor Costa, MD, 30 mL at 06/13/15 [phone] . feeding supplement (VITAL AF 1.2 CAL) liquid 237 mL, 237 mL, Per Tube, TID, Ivor Costa, MD, Last Rate: 50 mL/hr at 06/12/15 2155, 237 mL at 06/12/15 2155 . folic acid (FOLVITE) tablet 1 mg, 1 mg, Oral, Daily, Ivor Costa, MD, 1 mg at 06/13/15 0940 . free water 200 mL, 200 mL, Per Tube, 3 times per day, Ivor Costa, MD, 200 mL at 06/13/15 0505 . hydrALAZINE (APRESOLINE)  injection 10 mg, 10 mg, Intravenous, Q6H PRN, Maryann Mikhail, DO, 10 mg at 06/09/15 1533 . insulin aspart (novoLOG) injection 0-9 Units, 0-9 Units, Subcutaneous, 6 times per day, Orson Eva, MD, 2 Units at 06/13/15 1330 . ipratropium-albuterol (DUONEB) 0.5-2.5 (3) MG/3ML nebulizer solution 3 mL, 3 mL, Nebulization, Q6H PRN, Ivor Costa, MD . lisinopril (PRINIVIL,ZESTRIL) tablet 10 mg, 10 mg, Oral, Daily, Maryann Mikhail, DO, 10 mg at 06/13/15 0941 . multivitamin with minerals tablet 1 tablet, 1 tablet, Oral, Daily, Ivor Costa, MD, 1 tablet at 06/13/15 [phone] . nicotine (NICODERM CQ - dosed in mg/24 hr) patch 7 mg, 7 mg, Transdermal, Daily, Ivor Costa, MD, 7 mg at 06/13/15 0947 . nitroGLYCERIN (NITROSTAT) SL tablet 0.4 mg, 0.4 mg, Sublingual, Q5 min PRN, Ivor Costa, MD . Rivaroxaban (XARELTO) tablet 15 mg, 15 mg, Oral, BID WC, 15 mg at 06/13/15 0940 **FOLLOWED BY** [START ON 06/30/2015] rivaroxaban (XARELTO) tablet 20 mg, 20 mg, Oral, Q supper, Lauren D Bajbus, RPH . senna-docusate (Senokot-S) tablet 1 tablet, 1 tablet, Oral, BID PRN, Ivor Costa, MD . sodium chloride 0.9 % injection 3 mL, 3 mL, Intravenous, Q12H, Ivor Costa, MD, 3 mL at 06/12/15 2154 . thiamine (VITAMIN B-1) tablet 100 mg, 100 mg, Oral, Daily, Ivor Costa, MD, 100 mg at 06/13/15 0940  Patients Current Diet: DIET DYS 2 Room service appropriate?: Yes; Fluid consistency:: Nectar Thick  Precautions / Restrictions Precautions Precautions: Fall Precautions/Special Needs: Swallowing Precaution Comments: Peg, Rt shoulder subluxation Restrictions Weight Bearing Restrictions: No   Has the patient had 2 or more falls or a fall with injury in the past year?No  Prior Activity Level Community (  5-7x/wk): pt was completely independent and driving prior to initial admit 04/11/2015. Was admitted 04/11/15 to acute hospital for ICH, discharged to 11/4/16[facility] and then readmitted to acute inpt rehab 11/23 then d/c'd to acute hospital 12/15.  Home  Assistive Devices / Equipment Home Assistive Devices/Equipment: None  Prior Device Use: Indicate devices/aids used by the patient prior to current illness, exacerbation or injury? None of the above  Prior Functional Level Prior Function Level of Independence: Independent Comments: On CIR - husband has been educated on tranfers, w/c mobility.  Self Care: Did the patient need help bathing, dressing, using the toilet or eating? Independent  Indoor Mobility: Did the patient need assistance with walking from room to room (with or without device)? Independent  Stairs: Did the patient need assistance with internal or external stairs (with or without device)? Independent  Functional Cognition: Did the patient need help planning regular tasks such as shopping or remembering to take medications? Independent  Current Functional Level Cognition  Overall Cognitive Status: Impaired/Different from baseline Difficult to assess due to: Impaired communication Orientation Level: Other (comment) (expressive aphasia ) Following Commands: Follows one step commands with increased time General Comments: apparently apraxic   Extremity Assessment (includes Sensation/Coordination)  Upper Extremity Assessment: RUE deficits/detail RUE Deficits / Details: flaccid with subluxation RUE Coordination: decreased gross motor, decreased fine motor  Lower Extremity Assessment: RLE deficits/detail RLE Deficits / Details: flaccid, no movement noted    ADLs  Overall ADL's : Needs assistance/impaired Eating/Feeding: NPO Grooming: Moderate assistance (supported sitting) Upper Body Bathing: Moderate assistance (supported sitting) Lower Body Bathing: Maximal assistance (with Max A +2 sit<>stand) Upper Body Dressing : Maximal assistance (supported sitting) Lower Body Dressing: Total assistance (with Max A +2 sit<>stand) Toilet Transfer: Moderate assistance, +2 for physical assistance, Stand-pivot,  BSC Toileting- Clothing Manipulation and Hygiene: Total assistance (max A+2 sit<>stand) Functional mobility during ADLs: Maximal assistance General ADL Comments: Focus of session on family education. Husband states he has DME at home. He staes the w/c was dropped and the 1/2 lap tray for RUE support was broken and that the hospital bed does not raise and lower like "our beds". Asked husband to demonstrte trnfer technique from bed - chair. Husband unable to transfer pt. Therapist demonstrated squat pivot transfer from bed - chair. Husband then asked to trnasfer pt back to bed. Husband unable to transfer pt back to bed toward her strong side. On observation, husband states that people transfer her all "different ways", Pt and husband apparently frustrated with situation.     Mobility  Overal bed mobility: Needs Assistance Bed Mobility: Rolling, Sidelying to Sit, Sit to Sidelying Rolling: Mod assist Sidelying to sit: Mod assist Sit to sidelying: Mod assist, +2 for physical assistance General bed mobility comments: rolls right with min assist and to left with total assist.     Transfers  Overall transfer level: Needs assistance Equipment used: None Transfers: Squat Pivot Transfers Sit to Stand: (unable with 1 person) Stand pivot transfers: +2 physical assistance, Mod assist Squat pivot transfers: Max assist General transfer comment: Pt needed max assist of 2 persons to achieve sit to stand and mod assist of 2 for stand pivot transfer. Blocked right knee and pt able to step left LE around for pivot transfer. Pt with assist for steadying and use of pad to assist with hip extension. Pt needed assist to control descent into chair as well.     Ambulation / Gait / Stairs / Wheelchair Mobility         Posture / Balance Dynamic Sitting Balance Sitting balance - Comments: Pt initially total assist to sit EOB but with some facilitation was able to sit with min assist on EOB. Pt at times  pushes with her left UE. Pt with better sitting balance after leaning on left elbow.  Balance Overall balance assessment: Needs assistance Sitting-balance support: Single extremity supported, Feet supported Sitting balance-Leahy Scale: Poor Sitting balance - Comments: Pt initially total assist to sit EOB but with some facilitation was able to sit with min assist on EOB. Pt at times pushes with her left UE. Pt with better sitting balance after leaning on left elbow.  Postural control: Left lateral lean, Posterior lean Standing balance support: Single extremity supported Standing balance-Leahy Scale: Zero    Special needs/care consideration Oxygen 2 liters [address]   Bowel mgmt:incontinent Bladder mgmt: incontinent Diabetic mgmt Hgb A1c 11.4 11/16   Previous Home Environment Living Arrangements: Spouse/significant other Lives With: Spouse, Son, Other (Comment) (spouse, Thomas, son, Jeremiah and son's girlfriend, Amy) Available Help at Discharge: Family, Available 24 hours/day Type of Home: House Home Layout: Two level, Able to live on main level with bedroom/bathroom Home Access: Stairs to enter Entrance Stairs-Rails: Right, Left, Can reach both Entrance Stairs-Number of Steps: 3 Bathroom Shower/Tub: Tub/shower unit, Curtain Bathroom Toilet: Standard Bathroom Accessibility: Yes How Accessible: Accessible via walker Home Care Services: Other (Comment)  Discharge Living Setting Plans for Discharge Living Setting: Patient's home, Lives with (comment) (Lives with spouse, Thomas, son, Jeremiah and his gf, Amy) Type of Home at Discharge: House Discharge Home Layout: Two level, Able to live on main level with bedroom/bathroom Discharge Home Access: Stairs to enter Entrance Stairs-Rails: Right, Left, Can reach both Entrance Stairs-Number of Steps: 3 Discharge Bathroom Shower/Tub: Tub/shower unit, Curtain Discharge Bathroom Toilet: Standard Discharge Bathroom  Accessibility: Yes How Accessible: Accessible via walker Does the patient have any problems obtaining your medications?: No  Social/Family/Support Systems Patient Roles: Spouse, Parent Contact Information: Thomas, spouse and son, Matt, who Matt coordinates the entire family Anticipated Caregiver: Family Anticipated Caregiver's Contact Information: see above Ability/Limitations of Caregiver: Thomas, Jeremiah, and AMy will provide 24/7 physical care of pt Caregiver Availability: 24/7 Discharge Plan Discussed with Primary Caregiver: Yes Is Caregiver In Agreement with Plan?: Yes Does Caregiver/Family have Issues with Lodging/Transportation while Pt is in Rehab?: No  Goals/Additional Needs Patient/Family Goal for Rehab: Min to mod assist with PT, OT, and SLP Expected length of stay: ELOS 5-7 days to complete family education as well as pt lost some funcitonal mobility and diet tolerance when readmitted to acute 12/15 unitl 12/21 Pt/Family Agrees to Admission and willing to participate: Yes Program Orientation Provided & Reviewed with Pt/Caregiver Including Roles & Responsibilities: Yes  Decrease burden of Care through IP rehab admission: n/a  Possible need for SNF placement upon discharge: not anticipated  Patient Condition: This patient's medical and functional status is appropriate for intensive rehabilitative care in an inpatient rehabilitation facility. See "History of Present Illness" (above) for medical update.Patient is overall mod to max assist.. Patient's medical and functional status update has been discussed with the Rehabilitation physician since pt readmitted to acute hospital from [facility] 06/07/15 and patient remains appropriate for inpatient rehabilitation. Will admit to inpatient rehab today.  Preadmission Screen Completed By: Boyette, Barbara Godwin, 06/13/2015 3:28 PM ______________________________________________________________________  Discussed status with Dr. Patel on  06/13/15 at 1512 and received telephone approval for admission today.  Admission Coordinator: Boyette, Barbara Godwin, time 1512 Date 06/13/2015.            Cosigned by: Ankit Anil Patel, MD at 06/13/2015 3:32 PM  Revision History     Date/Time User Provider Type Action   06/13/2015 3:32 PM Ankit Anil Patel, MD Physician Cosign   06/13/2015 3:29 PM Barbara G Boyette, RN Rehab Admission Coordinator Sign   06/13/2015 3:28 PM Barbara G Boyette, RN Rehab Admission Coordinator Sign   View Details Report              

## 2015-06-13 NOTE — H&P (View-Only) (Signed)
Physical Medicine and Rehabilitation Admission H&P    CC: Debility after RLL PE/DVT in setting of left MCA infarct.   HPI:  Kerry [name] is a 59 y.o. female with history of breast cancer, HTN, CAD, chronic pain, DM type 2, medical non-compliance who was admitted on 04/11/15 with difficulty speaking, right facial and right sided due to [facility] infarct with occlusion of L-ICA with hemorrhagic conversion. She had  was treated with embolectomy and stent with TICI revascularization but developed dense right hemiparesis with global aphasia, apraxia and dysphagia.  She had complicated hospital course with  VDRF requiring trach and PEG. She was transferred to [facility] for vent wean on 11/02. She tolerated decannulation and was started on dyphagia 2 diet with honey liquids. She was showing improvement in activity tolerance and was felt to be ready for intensive therapy program.   She was admitted to CIR on 05/16/15. She continued to have dysphagia with poor po intake, bouts of nausea and vomiting as well as nose bleeds. She was made NPO on 12/12 due to black emesis and concerns of GIB.  Work up negative and diet resumed.  She developed tachycardia with hypotension on 12/14 due to dehydration, RLL PE and was transferred to step down unit. She was found to have focal acute non-occlusive DVT in R-CFV. Neurology was consulted for input and Plavix was changed to Xarelto for treatment.  She was treated with racemic epi and dexamethasone without improvement in chronic stridor and ENT was consulted for input. Laryngoscopy by Dr. 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 Could consider follow up with ENT at one of [facility] once she improves from recent issues.  Family education was almost completed during her rehab stay but she has declined due to immobility on acute due to medical issues. She is admitted  back to CIR to complete family education.    Review of Systems  Unable to perform ROS: language      Past Medical History  Diagnosis Date  . Hypertension   . Coronary artery disease   . Diabetes mellitus   . Breast cancer ([place]) 03/10/2007    Left breat  . Degenerative disc disease, cervical   . Degenerative disc disease, lumbar   . Fibromyalgia   . Hyperlipidemia   . Carpal tunnel syndrome on right   . PE (pulmonary embolism)   . UTI (lower urinary tract infection)   . GIB (gastrointestinal bleeding)   . Elevated troponin     Past Surgical History  Procedure Laterality Date  . Cardiac surgery    . Appendectomy    . Breast surgery    . Mastectomy Left 03/2007  . Cardiac catheterization  2005  . Vein surgery Right 2005  . Tubal ligation Right   . Radiology with anesthesia N/A 04/11/2015    Procedure: RADIOLOGY WITH ANESTHESIA;  Surgeon: Luanne Bras, MD;  Location: [facility];  Service: Radiology;  Laterality: N/A;  . Cardiac catheterization N/A 04/13/2015    Procedure: Temporary Pacemaker;  Surgeon: Peter M Martinique, MD;  Location: [facility];  Service: Cardiovascular;  Laterality: N/A;  . Peg placement N/A 04/18/2015    Procedure: PERCUTANEOUS ENDOSCOPIC GASTROSTOMY (PEG) PLACEMENT;  Surgeon: Judeth Horn, MD;  Location: [facility];  Service: General;  Laterality: N/A;  bedside  . Esophagogastroduodenoscopy N/A 04/18/2015    Procedure: ESOPHAGOGASTRODUODENOSCOPY (EGD);  Surgeon: Judeth Horn, MD;  Location:  [facility];  Service: General;  Laterality: N/A;    Family History  Problem Relation Age of Onset  . Cancer Mother     Breast cancer (right)  . Diabetes Sister   . Cancer Maternal Aunt     Breast cancer  . Cancer Maternal Aunt     Breast cancer  . Cancer Cousin     Breast Cancer    Social History:  Was independent prior to stroke 03/2015. Per  reports that she has been smoking.  She has never used smokeless tobacco. Per reports that she drinks  alcohol. She reports that she does not use illicit drugs.    Allergies  Allergen Reactions  . Codeine     Nausea?-- "made her sick"  . Oxycodone Nausea Only    Sick   . Oxycontin [Oxycodone Hcl] Nausea Only    Sick     Medications Prior to Admission  Medication Sig Dispense Refill  . acetaminophen (TYLENOL) 160 MG/5ML solution Take 10.2-20.3 mLs (325-650 mg total) by mouth every 4 (four) hours as needed for mild pain. 120 mL 0  . atorvastatin (LIPITOR) 20 MG tablet Take 20 mg by mouth at bedtime.    . clopidogrel (PLAVIX) 75 MG tablet Take 1 tablet (75 mg total) by mouth daily. 30 tablet 2  . feeding supplement, ENSURE, (ENSURE) PUDG Take 1 Container by mouth 3 (three) times daily between meals.    . folic acid (FOLVITE) 1 MG tablet Take 1 mg by mouth daily.    Marland Kitchen glipiZIDE (GLUCOTROL) 5 MG tablet Take by mouth daily before breakfast.    . hydrocerin (EUCERIN) CREA Apply 1 application topically 2 (two) times daily. To bilateral feet  0  . Melatonin 3 MG TABS Take 6 mg by mouth at bedtime.    . Multiple Vitamins-Minerals (MULTIVITAMINS THER. W/MINERALS) TABS tablet Take 1 tablet by mouth daily.    Marland Kitchen senna-docusate (SENNA PLUS) 8.6-50 MG tablet Take 1 tablet by mouth 2 (two) times daily as needed for mild constipation.    . sodium chloride (OCEAN) 0.65 % SOLN nasal spray Place 1 spray into both nostrils 3 (three) times daily. To help with nose bleeds  0  . thiamine 100 MG tablet Take 100 mg by mouth daily.    Marland Kitchen zinc sulfate 220 MG capsule Take 220 mg by mouth daily at 6 PM.      Home: Home Living Family/patient expects to be discharged to:: Inpatient rehab Living Arrangements: Spouse/significant other Available Help at Discharge: Family, Available 24 hours/day Type of Home: House Home Access: Stairs to enter [employer] of Steps: 3 Entrance Stairs-Rails: Right, Left, Can reach both Home Layout: Two level, Able to live on main level with bedroom/bathroom Bathroom  Shower/Tub: Tub/shower unit, Air cabin crew Accessibility: Yes  Lives With: Spouse, Son, Other (Comment) (spouse, [name], son, [name] and son's girlfriend, Amy)   Functional History: Prior Function Level of Independence: Independent Comments: On CIR - husband has been educated on tranfers, w/c mobility.  Functional Status:  Mobility: Bed Mobility Overal bed mobility: Needs Assistance Bed Mobility: Rolling, Sidelying to Sit, Sit to Sidelying Rolling: Mod assist Sidelying to sit: Mod assist Sit to sidelying: Mod assist, +2 for physical assistance General bed mobility comments: rolls right with min assist and to left with total assist.  Transfers Overall transfer level: Needs assistance Equipment used: None Transfers: Squat Pivot Transfers Sit to Stand:  (unable with 1 person) Stand pivot transfers: +2 physical assistance, Mod assist Squat  pivot transfers: Max assist General transfer comment: Pt needed max assist of 2 persons to achieve sit to stand and mod assist of 2 for stand pivot transfer.  Blocked right knee and pt able to step left LE around for pivot transfer.  Pt with assist for steadying and use of pad to assist with hip extension.  Pt needed assist to control descent into chair as well.        ADL: ADL Overall ADL's : Needs assistance/impaired Eating/Feeding: NPO Grooming: Moderate assistance (supported sitting) Upper Body Bathing: Moderate assistance (supported sitting) Lower Body Bathing: Maximal assistance (with Max A +2 sit<>stand) Upper Body Dressing : Maximal assistance (supported sitting) Lower Body Dressing: Total assistance (with Max A +2 sit<>stand) Toilet Transfer: Moderate assistance, +2 for physical assistance, Stand-pivot, BSC Toileting- Clothing Manipulation and Hygiene: Total assistance (max A+2 sit<>stand) Functional mobility during ADLs: Maximal assistance General ADL Comments: Focus of session on family education.  Husband states he has DME at home. He staes the w/c was dropped and the 1/2 lap tray for RUE support was broken and that the hospital bed does not raise and lower like "our beds". Asked husband to demonstrte trnfer technique from bed - chair. Husband unable to transfer pt. Therapist demonstrated squat pivot transfer from bed - chair. Husband then asked to trnasfer pt back to bed. Husband unable to transfer pt back to bed toward her strong side. On observation, husband states that people transfer her all "different ways", Pt and husband apparently frustrated with situation.   Cognition: Cognition Overall Cognitive Status: Impaired/Different from baseline Orientation Level: Other (comment) (expressive aphasia ) Cognition Arousal/Alertness: Awake/alert Behavior During Therapy: WFL for tasks assessed/performed Overall Cognitive Status: Impaired/Different from baseline Area of Impairment: Following commands, Awareness, Problem solving Following Commands: Follows one step commands with increased time Awareness: Emergent Problem Solving: Slow processing, Decreased initiation, Difficulty sequencing, Requires verbal cues, Requires tactile cues General Comments: apparently apraxic Difficult to assess due to: Impaired communication   Blood pressure 149/103, pulse 92, temperature 97.4 F (36.3 C), temperature source Axillary, resp. rate 18, height 5' 10" (1.778 m), weight 80.1 kg (176 lb 9.4 oz), SpO2 99 %. Physical Exam  Nursing note and vitals reviewed. Constitutional: She appears well-developed and well-nourished.  Up in bed. Smiling and welcoming.   HENT:  Head: Normocephalic and atraumatic.  Poor dentition  Eyes: Conjunctivae and EOM are normal. Right eye exhibits no discharge. Left eye exhibits no discharge. No scleral icterus.  Anisocoria right > left pupil.   Neck: Normal range of motion. Neck supple.  Prior tracheostomy stoma well healed.   Cardiovascular: Normal rate and regular rhythm.    No murmur heard. Respiratory: Effort normal and breath sounds normal. No respiratory distress. She exhibits no tenderness.  GI: Bowel sounds are normal. She exhibits no distension. There is no tenderness.  Musculoskeletal:  Right knee painful to ROM with crepitus--improves with ROM.  Right hip and knee muscle tightness likely due to disuse.  RUE painful to ROM--question due to IV discomfort and/or disuse.  Min edema RUE/RLE  Neurological: She is alert.  Continues to make volitional stridorous sounds.  Dense right hemiparesis unchanged.  LUE/LLE 4+/5 proximal to distal RUE/RLE: 0/5 proximal to distal Dysarthria.   She is able to follow occasional simple motor commands with visual and tactile cues and continues with perseverative behaviors.  Continues to be limited by apraxia and expressive/receptive aphasia. Still tends to answer yes to most questions but now able to express an occasional word. Perseverative  behavior noted.  Skin: Skin is warm and dry.  Psychiatric: Her affect is inappropriate. Her speech is delayed. Cognition and memory are impaired.    Results for orders placed or performed during the hospital encounter of 06/07/15 (from the past 48 hour(s))  Glucose, capillary     Status: Abnormal   Collection Time: 06/11/15  5:22 PM  Result Value Ref Range   Glucose-Capillary 205 (H) 65 - 99 mg/dL  Glucose, capillary     Status: Abnormal   Collection Time: 06/11/15  9:05 PM  Result Value Ref Range   Glucose-Capillary 167 (H) 65 - 99 mg/dL  Glucose, capillary     Status: Abnormal   Collection Time: 06/12/15  1:13 AM  Result Value Ref Range   Glucose-Capillary 172 (H) 65 - 99 mg/dL   Comment 1 Notify RN   Glucose, capillary     Status: Abnormal   Collection Time: 06/12/15  4:25 AM  Result Value Ref Range   Glucose-Capillary 173 (H) 65 - 99 mg/dL  Basic metabolic panel     Status: Abnormal   Collection Time: 06/12/15  6:40 AM  Result Value Ref Range   Sodium 141 135 - 145  mmol/L   Potassium 3.9 3.5 - 5.1 mmol/L   Chloride 101 101 - 111 mmol/L   CO2 31 22 - 32 mmol/L   Glucose, Bld 188 (H) 65 - 99 mg/dL   BUN 8 6 - 20 mg/dL   Creatinine, Ser 0.32 (L) 0.44 - 1.00 mg/dL   Calcium 9.4 8.9 - 10.3 mg/dL   GFR calc non Af Amer >60 >60 mL/min   GFR calc Af Amer >60 >60 mL/min    Comment: (NOTE) The eGFR has been calculated using the CKD EPI equation. This calculation has not been validated in all clinical situations. eGFR's persistently <60 mL/min signify possible Chronic Kidney Disease.    Anion gap 9 5 - 15  Magnesium     Status: None   Collection Time: 06/12/15  6:40 AM  Result Value Ref Range   Magnesium 1.9 1.7 - 2.4 mg/dL  Glucose, capillary     Status: Abnormal   Collection Time: 06/12/15  7:56 AM  Result Value Ref Range   Glucose-Capillary 156 (H) 65 - 99 mg/dL  Glucose, capillary     Status: Abnormal   Collection Time: 06/12/15 11:39 AM  Result Value Ref Range   Glucose-Capillary 175 (H) 65 - 99 mg/dL  Glucose, capillary     Status: Abnormal   Collection Time: 06/12/15  4:46 PM  Result Value Ref Range   Glucose-Capillary 152 (H) 65 - 99 mg/dL  Glucose, capillary     Status: Abnormal   Collection Time: 06/12/15  9:01 PM  Result Value Ref Range   Glucose-Capillary 171 (H) 65 - 99 mg/dL  Glucose, capillary     Status: Abnormal   Collection Time: 06/13/15 12:12 AM  Result Value Ref Range   Glucose-Capillary 171 (H) 65 - 99 mg/dL  Glucose, capillary     Status: Abnormal   Collection Time: 06/13/15  4:45 AM  Result Value Ref Range   Glucose-Capillary 172 (H) 65 - 99 mg/dL   Comment 1 Notify RN   Glucose, capillary     Status: Abnormal   Collection Time: 06/13/15  8:06 AM  Result Value Ref Range   Glucose-Capillary 163 (H) 65 - 99 mg/dL  Glucose, capillary     Status: Abnormal   Collection Time: 06/13/15  9:24 AM  Result Value   Ref Range   Glucose-Capillary 179 (H) 65 - 99 mg/dL  Glucose, capillary     Status: Abnormal   Collection  Time: 06/13/15 12:19 PM  Result Value Ref Range   Glucose-Capillary 156 (H) 65 - 99 mg/dL   No results found.     Medical Problem List and Plan 1. 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  2.  PE/RLL DVT /Anticoagulation: Pharmaceutical: Xarelto  3. Pain Management: tylenol prn effective for chronic pain. Patient does not show any signs of distress.  4. Mood: Has lot of underlying psycho-social issues but patient lacks awareness/insight at this time. Pleasant and smiling most of the time. Team and LCSW to work with family to educate on monitoring mood and help facilitate discharge.  5. Neuropsych: This patient is not capable of making decisions on her own behalf. 6. Skin/Wound Care: Maintain adequate nutrition and hydration status.  Continue tube feeds. Routine pressure relief measures.  7. Fluids/Electrolytes/Nutrition: Continue tube feeds tid due to  8. HTN: Blood pressures trending upwards again. Continue Prinivil, coreg bid and  9. DM type 2: Monitor BS ac/hs. Resume Levemir titrate as indicated. Use SSI for elevated BS.  10.  Sepsis/SIRS:  Was treated for E coli UTI prior to discharge on 12/15.   Blood cultures X 2 negative. Patient did not have fevers and elevated WBC likely reactive due to DVT/PE. Has completed 5 days of IV antibiotics and 24 hours of oral antibiotic--will continue additional day for a week treatment and then d/c to avoid side effects/complications.   11. Recent GIB?: Resume pepcid for now. Will resume PPI once antibiotics  Completed.    Post Admission Physician Evaluation: 1. Functional deficits secondary to left MCA infarct/PE. 2. Patient is admitted to receive collaborative, interdisciplinary care between the physiatrist, rehab nursing staff, and therapy team. 3. Patient's level of medical complexity and substantial therapy needs in context of that medical necessity cannot be provided at a lesser intensity of care  such as a SNF. 4. Patient has experienced substantial functional loss from his/her baseline which was documented above under the "Functional History" and "Functional Status" headings.  Judging by the patient's diagnosis, physical exam, and functional history, the patient has potential for functional progress which will result in measurable gains while on inpatient rehab.  These gains will be of substantial and practical use upon discharge  in facilitating mobility and self-care at the household level. 5. Physiatrist will provide 24 hour management of medical needs as well as oversight of the therapy plan/treatment and provide guidance as appropriate regarding the interaction of the two. 6. 24 hour rehab nursing will assist with bladder management, bowel management, safety, skin/wound care, disease management, medication administration, pain management and patient education  and help integrate therapy concepts, techniques,education, etc. 7. PT will assess and treat for/with: Lower extremity strength, range of motion, stamina, balance, functional mobility, safety, adaptive techniques and equipment, woundcare, coping skills, pain control, education.   Goals are: Mod/Min A . 8. OT will assess and treat for/with: ADL's, functional mobility, safety, upper extremity strength, adaptive techniques and equipment, wound mgt, ego support, and community reintegration.   Goals are: Mod/Min A. Therapy may not proceed with showering this patient. 9. SLP will assess and treat for/with: speech, language, cognition, processing.  Goals are: Mod/Min A. 10. Case Management and Social Worker will assess and treat for psychological issues and discharge planning. 11. Team conference will be held weekly to assess progress toward goals and   to determine barriers to discharge. 12. Patient will receive at least 3 hours of therapy per day at least 5 days per week. 13. ELOS: 5-7 days.       14. Prognosis:  fair     Loden Laurent,  MD 06/13/2015 

## 2015-06-13 NOTE — Progress Notes (Signed)
I have insurance approval to admit pt to inpt rehab today. I will make the arrangements for today.I have notified Dr. Carles Collet and RN CM and family. [phone]

## 2015-06-13 NOTE — Progress Notes (Signed)
I await insurance decision for readmit to inpt rehab today vs home with [facility] today. [id number]

## 2015-06-13 NOTE — Care Management Note (Addendum)
Case Management Note  Patient Details  Name: Kerry [name] MRN: [id number] Date of Birth: [birth year]  Subjective/Objective:  NCM spoke with [name], informed him if we do not get approval for cir today to be prepared to take her home today, he said he will be ready.  Patient had all the DME delivered to home already.  She can ride in the car for transport.                 Action/Plan:   Expected Discharge Date:                  Expected Discharge Plan:  [place]  In-House Referral:     Discharge planning Services  CM Consult  Post Acute Care Choice:  Durable Medical Equipment, Home Health Choice offered to:     DME Arranged:    DME Agency:     HH Arranged:  RN, PT, OT, Nurse's Aide, Speech Therapy [place] Agency:  [facility]  Status of Service:     Medicare Important Message Given:    Date Medicare IM Given:    Medicare IM give by:    Date Additional Medicare IM Given:    Additional Medicare Important Message give by:     If discussed at [place] of Stay Meetings, dates discussed:    Additional Comments:  Zenon Mayo, RN 06/13/2015, 12:59 PM

## 2015-06-13 NOTE — Care Management Note (Signed)
Case Management Note  Patient Details  Name: Kerry [name] MRN: [id number] Date of Birth: 1955-09-28  Subjective/Objective:      Patient approved for CIR today per Pamala Hurry, [place] gave [name] xarelto 30 day saving trial card and 0 copay card for 12 months.  NCM also awaiting call back from pre[employer] for the 15mg  twice a day.                Action/Plan:   Expected Discharge Date:                  Expected Discharge Plan:  IP Rehab Facility  In-House Referral:     Discharge planning Services  CM Consult  Post Acute Care Choice:  Durable Medical Equipment, Home Health Choice offered to:     DME Arranged:    DME Agency:     HH Arranged:  RN, PT, OT, Nurse's Aide, Speech Therapy HH Agency:  [facility]  Status of Service:  Completed, signed off  Medicare Important Message Given:    Date Medicare IM Given:    Medicare IM give by:    Date Additional Medicare IM Given:    Additional Medicare Important Message give by:     If discussed at [place] of Stay Meetings, dates discussed:    Additional Comments:  Zenon Mayo, RN 06/13/2015, 3:09 PM

## 2015-06-13 NOTE — H&P (View-Only) (Deleted)
Physical Medicine and Rehabilitation Admission H&P    CC: Debility after RLL PE/DVT in setting of ICH.   HPI:  Kerry [name] is a 59 y.o. female with history of breast cancer, HTN, CAD, chronic pain, DM type 2, medical non-compliance who was admitted on 04/11/15 with difficulty speaking, right facial and right sided due to [facility] infarct with occlusion of L-ICA with hemorrhagic conversion. She had  was treated with embolectomy and stent with TICI revascularization but developed dense right hemiparesis with global aphasia, apraxia and dysphagia.  She had complicated hospital course with  VDRF requiring trach and PEG. She was transferred to [facility] for vent wean on 11/02. She tolerated decannulation and was started on dyphagia 2 diet with honey liquids. She was showing improvement in activity tolerance and was felt to be ready for intensive therapy program.   She was admitted to CIR on 05/16/15. She continued to have dysphagia with poor po intake, bouts of nausea and vomiting as well as nose bleeds. She was made NPO on 12/12 due to black emesis and concerns of GIB.  Work up negative and diet resumed.  She developed tachycardia with hypotension on 12/14 due to dehydration, RLL PE and was transferred to step down unit. She was found to have focal acute non-occlusive DVT in R-CFV. Neurology was consulted for input and Plavix was changed to Xarelto for treatment.  She was treated with racemic epi and dexamethasone without improvement in chronic stridor and ENT was consulted for input. Laryngoscopy by Dr. 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 Could consider follow up with ENT at one of [facility] once she improves from recent issues.  Family education was almost completed during her rehab stay but she has declined due to immobility on acute due to medical issues. She is admitted back to CIR to  complete family education.    Review of Systems  Unable to perform ROS: language      Past Medical History  Diagnosis Date  . Hypertension   . Coronary artery disease   . Diabetes mellitus   . Breast cancer ([place]) 03/10/2007    Left breat  . Degenerative disc disease, cervical   . Degenerative disc disease, lumbar   . Fibromyalgia   . Hyperlipidemia   . Carpal tunnel syndrome on right   . PE (pulmonary embolism)   . UTI (lower urinary tract infection)   . GIB (gastrointestinal bleeding)   . Elevated troponin     Past Surgical History  Procedure Laterality Date  . Cardiac surgery    . Appendectomy    . Breast surgery    . Mastectomy Left 03/2007  . Cardiac catheterization  2005  . Vein surgery Right 2005  . Tubal ligation Right   . Radiology with anesthesia N/A 04/11/2015    Procedure: RADIOLOGY WITH ANESTHESIA;  Surgeon: Luanne Bras, MD;  Location: [place];  Service: Radiology;  Laterality: N/A;  . Cardiac catheterization N/A 04/13/2015    Procedure: Temporary Pacemaker;  Surgeon: Peter M Martinique, MD;  Location: [place] [facility];  Service: Cardiovascular;  Laterality: N/A;  . Peg placement N/A 04/18/2015    Procedure: PERCUTANEOUS ENDOSCOPIC GASTROSTOMY (PEG) PLACEMENT;  Surgeon: Judeth Horn, MD;  Location: [place];  Service: General;  Laterality: N/A;  bedside  . Esophagogastroduodenoscopy N/A 04/18/2015    Procedure: ESOPHAGOGASTRODUODENOSCOPY (EGD);  Surgeon: Judeth Horn, MD;  Location: [place];  Service: General;  Laterality: N/A;    Family History  Problem Relation Age of Onset  . Cancer Mother     Breast cancer (right)  . Diabetes Sister   . Cancer Maternal Aunt     Breast cancer  . Cancer Maternal Aunt     Breast cancer  . Cancer Cousin     Breast Cancer    Social History:  Was independent prior to stroke 03/2015. Per  reports that she has been smoking.  She has never used smokeless tobacco. Per reports that she drinks alcohol. She  reports that she does not use illicit drugs.    Allergies  Allergen Reactions  . Codeine     Nausea?-- "made her sick"  . Oxycodone Nausea Only    Sick   . Oxycontin [Oxycodone Hcl] Nausea Only    Sick     Medications Prior to Admission  Medication Sig Dispense Refill  . acetaminophen (TYLENOL) 160 MG/5ML solution Take 10.2-20.3 mLs (325-650 mg total) by mouth every 4 (four) hours as needed for mild pain. 120 mL 0  . atorvastatin (LIPITOR) 20 MG tablet Take 20 mg by mouth at bedtime.    . clopidogrel (PLAVIX) 75 MG tablet Take 1 tablet (75 mg total) by mouth daily. 30 tablet 2  . feeding supplement, ENSURE, (ENSURE) PUDG Take 1 Container by mouth 3 (three) times daily between meals.    . folic acid (FOLVITE) 1 MG tablet Take 1 mg by mouth daily.    Marland Kitchen glipiZIDE (GLUCOTROL) 5 MG tablet Take by mouth daily before breakfast.    . hydrocerin (EUCERIN) CREA Apply 1 application topically 2 (two) times daily. To bilateral feet  0  . Melatonin 3 MG TABS Take 6 mg by mouth at bedtime.    . Multiple Vitamins-Minerals (MULTIVITAMINS THER. W/MINERALS) TABS tablet Take 1 tablet by mouth daily.    Marland Kitchen senna-docusate (SENNA PLUS) 8.6-50 MG tablet Take 1 tablet by mouth 2 (two) times daily as needed for mild constipation.    . sodium chloride (OCEAN) 0.65 % SOLN nasal spray Place 1 spray into both nostrils 3 (three) times daily. To help with nose bleeds  0  . thiamine 100 MG tablet Take 100 mg by mouth daily.    Marland Kitchen zinc sulfate 220 MG capsule Take 220 mg by mouth daily at 6 PM.      Home: Home Living Family/patient expects to be discharged to:: Inpatient rehab Living Arrangements: Spouse/significant other Available Help at Discharge: Family, Available 24 hours/day Type of Home: House Home Access: Stairs to enter [employer] of Steps: 3 Entrance Stairs-Rails: Right, Left, Can reach both Home Layout: Two level, Able to live on main level with bedroom/bathroom Bathroom Shower/Tub:  Tub/shower unit, Air cabin crew Accessibility: Yes  Lives With: Spouse, Son, Other (Comment) (spouse, [name], son, [name] and son's girlfriend, Amy)   Functional History: Prior Function Level of Independence: Independent Comments: On CIR - husband has been educated on tranfers, w/c mobility.  Functional Status:  Mobility: Bed Mobility Overal bed mobility: Needs Assistance Bed Mobility: Rolling, Sidelying to Sit, Sit to Sidelying Rolling: Mod assist Sidelying to sit: Mod assist Sit to sidelying: Mod assist, +2 for physical assistance General bed mobility comments: rolls right with min assist and to left with total assist.  Transfers Overall transfer level: Needs assistance Equipment used: None Transfers: Squat Pivot Transfers Sit to Stand:  (unable with 1 person) Stand pivot transfers: +2 physical assistance, Mod assist Squat pivot transfers: Max  assist General transfer comment: Pt needed max assist of 2 persons to achieve sit to stand and mod assist of 2 for stand pivot transfer.  Blocked right knee and pt able to step left LE around for pivot transfer.  Pt with assist for steadying and use of pad to assist with hip extension.  Pt needed assist to control descent into chair as well.        ADL: ADL Overall ADL's : Needs assistance/impaired Eating/Feeding: NPO Grooming: Moderate assistance (supported sitting) Upper Body Bathing: Moderate assistance (supported sitting) Lower Body Bathing: Maximal assistance (with Max A +2 sit<>stand) Upper Body Dressing : Maximal assistance (supported sitting) Lower Body Dressing: Total assistance (with Max A +2 sit<>stand) Toilet Transfer: Moderate assistance, +2 for physical assistance, Stand-pivot, BSC Toileting- Clothing Manipulation and Hygiene: Total assistance (max A+2 sit<>stand) Functional mobility during ADLs: Maximal assistance General ADL Comments: Focus of session on family education. Husband states  he has DME at home. He staes the w/c was dropped and the 1/2 lap tray for RUE support was broken and that the hospital bed does not raise and lower like "our beds". Asked husband to demonstrte trnfer technique from bed - chair. Husband unable to transfer pt. Therapist demonstrated squat pivot transfer from bed - chair. Husband then asked to trnasfer pt back to bed. Husband unable to transfer pt back to bed toward her strong side. On observation, husband states that people transfer her all "different ways", Pt and husband apparently frustrated with situation.   Cognition: Cognition Overall Cognitive Status: Impaired/Different from baseline Orientation Level: Other (comment) (expressive aphasia ) Cognition Arousal/Alertness: Awake/alert Behavior During Therapy: WFL for tasks assessed/performed Overall Cognitive Status: Impaired/Different from baseline Area of Impairment: Following commands, Awareness, Problem solving Following Commands: Follows one step commands with increased time Awareness: Emergent Problem Solving: Slow processing, Decreased initiation, Difficulty sequencing, Requires verbal cues, Requires tactile cues General Comments: apparently apraxic Difficult to assess due to: Impaired communication   Blood pressure 149/103, pulse 92, temperature 97.4 F (36.3 C), temperature source Axillary, resp. rate 18, height 5' 10"  (1.778 m), weight 80.1 kg (176 lb 9.4 oz), SpO2 99 %. Physical Exam  Nursing note and vitals reviewed. Constitutional: She appears well-developed and well-nourished.  Up in bed. Smiling and welcoming.   HENT:  Head: Normocephalic and atraumatic.  Poor dentition  Eyes: Conjunctivae and EOM are normal. Right eye exhibits no discharge. Left eye exhibits no discharge. No scleral icterus.  Anisocoria right > left pupil.   Neck: Normal range of motion. Neck supple.  Prior tracheostomy stoma well healed.   Cardiovascular: Normal rate and regular rhythm.   No murmur  heard. Respiratory: Effort normal and breath sounds normal. No respiratory distress. She exhibits no tenderness.  GI: Bowel sounds are normal. She exhibits no distension. There is no tenderness.  Musculoskeletal:  Right knee painful to ROM with crepitus--improves with ROM.  Right hip and knee muscle tightness likely due to disuse.  RUE painful to ROM--question due to IV discomfort and/or disuse.  Min edema RUE/RLE  Neurological: She is alert.  Continues to make volitional stridorous sounds.  Dense right hemiparesis unchanged.  LUE/LLE 4+/5 proximal to distal RUE/RLE: 0/5 proximal to distal Dysarthria.   She is able to follow occasional simple motor commands with visual and tactile cues and continues with perseverative behaviors.  Continues to be limited by apraxia and expressive/receptive aphasia. Still tends to answer yes to most questions but now able to express an occasional word. Perseverative behavior noted.  Skin: Skin is warm and dry.  Psychiatric: Her affect is inappropriate. Her speech is delayed. Cognition and memory are impaired.    Results for orders placed or performed during the hospital encounter of 06/07/15 (from the past 48 hour(s))  Glucose, capillary     Status: Abnormal   Collection Time: 06/11/15  5:22 PM  Result Value Ref Range   Glucose-Capillary 205 (H) 65 - 99 mg/dL  Glucose, capillary     Status: Abnormal   Collection Time: 06/11/15  9:05 PM  Result Value Ref Range   Glucose-Capillary 167 (H) 65 - 99 mg/dL  Glucose, capillary     Status: Abnormal   Collection Time: 06/12/15  1:13 AM  Result Value Ref Range   Glucose-Capillary 172 (H) 65 - 99 mg/dL   Comment 1 Notify RN   Glucose, capillary     Status: Abnormal   Collection Time: 06/12/15  4:25 AM  Result Value Ref Range   Glucose-Capillary 173 (H) 65 - 99 mg/dL  Basic metabolic panel     Status: Abnormal   Collection Time: 06/12/15  6:40 AM  Result Value Ref Range   Sodium 141 135 - 145 mmol/L    Potassium 3.9 3.5 - 5.1 mmol/L   Chloride 101 101 - 111 mmol/L   CO2 31 22 - 32 mmol/L   Glucose, Bld 188 (H) 65 - 99 mg/dL   BUN 8 6 - 20 mg/dL   Creatinine, Ser 0.32 (L) 0.44 - 1.00 mg/dL   Calcium 9.4 8.9 - 10.3 mg/dL   GFR calc non Af Amer >60 >60 mL/min   GFR calc Af Amer >60 >60 mL/min    Comment: (NOTE) The eGFR has been calculated using the CKD EPI equation. This calculation has not been validated in all clinical situations. eGFR's persistently <60 mL/min signify possible Chronic Kidney Disease.    Anion gap 9 5 - 15  Magnesium     Status: None   Collection Time: 06/12/15  6:40 AM  Result Value Ref Range   Magnesium 1.9 1.7 - 2.4 mg/dL  Glucose, capillary     Status: Abnormal   Collection Time: 06/12/15  7:56 AM  Result Value Ref Range   Glucose-Capillary 156 (H) 65 - 99 mg/dL  Glucose, capillary     Status: Abnormal   Collection Time: 06/12/15 11:39 AM  Result Value Ref Range   Glucose-Capillary 175 (H) 65 - 99 mg/dL  Glucose, capillary     Status: Abnormal   Collection Time: 06/12/15  4:46 PM  Result Value Ref Range   Glucose-Capillary 152 (H) 65 - 99 mg/dL  Glucose, capillary     Status: Abnormal   Collection Time: 06/12/15  9:01 PM  Result Value Ref Range   Glucose-Capillary 171 (H) 65 - 99 mg/dL  Glucose, capillary     Status: Abnormal   Collection Time: 06/13/15 12:12 AM  Result Value Ref Range   Glucose-Capillary 171 (H) 65 - 99 mg/dL  Glucose, capillary     Status: Abnormal   Collection Time: 06/13/15  4:45 AM  Result Value Ref Range   Glucose-Capillary 172 (H) 65 - 99 mg/dL   Comment 1 Notify RN   Glucose, capillary     Status: Abnormal   Collection Time: 06/13/15  8:06 AM  Result Value Ref Range   Glucose-Capillary 163 (H) 65 - 99 mg/dL  Glucose, capillary     Status: Abnormal   Collection Time: 06/13/15  9:24 AM  Result Value Ref Range  Glucose-Capillary 179 (H) 65 - 99 mg/dL  Glucose, capillary     Status: Abnormal   Collection Time: 06/13/15  12:19 PM  Result Value Ref Range   Glucose-Capillary 156 (H) 65 - 99 mg/dL   No results found.     Medical Problem List and Plan 1. Aphasia, dysarhria, hemiplegia, gait abnormality secondary to [place] and PE/DVT.  2.  PE/RLL DVT /Anticoagulation: Pharmaceutical: Xarelto  3. Pain Management: tylenol prn effective for chronic pain. Patient does not show any signs of distress.  4. Mood: Has lot of underlying psycho-social issues but patient lacks awareness/insight at this time. Pleasant and smiling most of the time. Team and LCSW to work with family to educate on monitoring mood and help facilitate discharge.  5. Neuropsych: This patient is not capable of making decisions on her own behalf. 6. Skin/Wound Care: Maintain adequate nutrition and hydration status.  Continue tube feeds. Routine pressure relief measures.  7. Fluids/Electrolytes/Nutrition: Continue tube feeds tid due to  8. HTN: Blood pressures trending upwards again. Continue Prinivil, coreg bid and  9. DM type 2: Monitor BS ac/hs. Resume Levemir titrate as indicated. Use SSI for elevated BS.  10.  Sepsis/SIRS:  Was treated for E coli UTI prior to discharge on 12/15.   Blood cultures X 2 negative. Patient did not have fevers and elevated WBC likely reactive due to DVT/PE. Has completed 5 days of IV antibiotics and 24 hours of oral antibiotic--will continue additional day for a week treatment and then d/c to avoid side effects/complications.   11. Recent GIB?: Resume pepcid for now. Will resume PPI once antibiotics  Completed.    Post Admission Physician Evaluation: 1. Functional deficits secondary  to ICH/PE. 2. Patient is admitted to receive collaborative, interdisciplinary care between the physiatrist, rehab nursing staff, and therapy team. 3. Patient's level of medical complexity and substantial therapy needs in context of that medical necessity cannot be provided at a lesser intensity of care such as a SNF. 4. Patient has  experienced substantial functional loss from his/her baseline which was documented above under the "Functional History" and "Functional Status" headings.  Judging by the patient's diagnosis, physical exam, and functional history, the patient has potential for functional progress which will result in measurable gains while on inpatient rehab.  These gains will be of substantial and practical use upon discharge  in facilitating mobility and self-care at the household level. 5. Physiatrist will provide 24 hour management of medical needs as well as oversight of the therapy plan/treatment and provide guidance as appropriate regarding the interaction of the two. 6. 24 hour rehab nursing will assist with bladder management, bowel management, safety, skin/wound care, disease management, medication administration, pain management and patient education  and help integrate therapy concepts, techniques,education, etc. 7. PT will assess and treat for/with: Lower extremity strength, range of motion, stamina, balance, functional mobility, safety, adaptive techniques and equipment, woundcare, coping skills, pain control, education.   Goals are: Mod/Min A . 8. OT will assess and treat for/with: ADL's, functional mobility, safety, upper extremity strength, adaptive techniques and equipment, wound mgt, ego support, and community reintegration.   Goals are: Mod/Min A. Therapy may not proceed with showering this patient. 9. SLP will assess and treat for/with: speech, language, cognition, processing.  Goals are: Mod/Min A. 10. Case Management and Social Worker will assess and treat for psychological issues and discharge planning. 11. Team conference will be held weekly to assess progress toward goals and to determine barriers to discharge. 12. Patient  will receive at least 3 hours of therapy per day at least 5 days per week. 13. ELOS: 5-7 days.       14. Prognosis:  fair     Delice Lesch, MD 06/13/2015

## 2015-06-13 NOTE — H&P (Addendum)
Physical Medicine and Rehabilitation Admission H&P    CC: Debility after RLL PE/DVT in setting of left MCA infarct.   HPI:  Kerry [name] is a 59 y.o. female with history of breast cancer, HTN, CAD, chronic pain, DM type 2, medical non-compliance who was admitted on 04/11/15 with difficulty speaking, right facial and right sided due to [facility] infarct with occlusion of L-ICA with hemorrhagic conversion. She had  was treated with embolectomy and stent with TICI revascularization but developed dense right hemiparesis with global aphasia, apraxia and dysphagia.  She had complicated hospital course with  VDRF requiring trach and PEG. She was transferred to [facility] for vent wean on 11/02. She tolerated decannulation and was started on dyphagia 2 diet with honey liquids. She was showing improvement in activity tolerance and was felt to be ready for intensive therapy program.   She was admitted to CIR on 05/16/15. She continued to have dysphagia with poor po intake, bouts of nausea and vomiting as well as nose bleeds. She was made NPO on 12/12 due to black emesis and concerns of GIB.  Work up negative and diet resumed.  She developed tachycardia with hypotension on 12/14 due to dehydration, RLL PE and was transferred to step down unit. She was found to have focal acute non-occlusive DVT in R-CFV. Neurology was consulted for input and Plavix was changed to Xarelto for treatment.  She was treated with racemic epi and dexamethasone without improvement in chronic stridor and ENT was consulted for input. Laryngoscopy by Dr. 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 Could consider follow up with ENT at one of [facility] once she improves from recent issues.  Family education was almost completed during her rehab stay but she has declined due to immobility on acute due to medical issues. She is admitted  back to CIR to complete family education.    Review of Systems  Unable to perform ROS: language      Past Medical History  Diagnosis Date  . Hypertension   . Coronary artery disease   . Diabetes mellitus   . Breast cancer ([place]) 03/10/2007    Left breat  . Degenerative disc disease, cervical   . Degenerative disc disease, lumbar   . Fibromyalgia   . Hyperlipidemia   . Carpal tunnel syndrome on right   . PE (pulmonary embolism)   . UTI (lower urinary tract infection)   . GIB (gastrointestinal bleeding)   . Elevated troponin     Past Surgical History  Procedure Laterality Date  . Cardiac surgery    . Appendectomy    . Breast surgery    . Mastectomy Left 03/2007  . Cardiac catheterization  2005  . Vein surgery Right 2005  . Tubal ligation Right   . Radiology with anesthesia N/A 04/11/2015    Procedure: RADIOLOGY WITH ANESTHESIA;  Surgeon: Luanne Bras, MD;  Location: [facility];  Service: Radiology;  Laterality: N/A;  . Cardiac catheterization N/A 04/13/2015    Procedure: Temporary Pacemaker;  Surgeon: Peter M Martinique, MD;  Location: [place] [facility];  Service: Cardiovascular;  Laterality: N/A;  . Peg placement N/A 04/18/2015    Procedure: PERCUTANEOUS ENDOSCOPIC GASTROSTOMY (PEG) PLACEMENT;  Surgeon: Judeth Horn, MD;  Location: [place];  Service: General;  Laterality: N/A;  bedside  . Esophagogastroduodenoscopy N/A 04/18/2015    Procedure: ESOPHAGOGASTRODUODENOSCOPY (EGD);  Surgeon: Judeth Horn, MD;  Location:  [facility];  Service: General;  Laterality: N/A;    Family History  Problem Relation Age of Onset  . Cancer Mother     Breast cancer (right)  . Diabetes Sister   . Cancer Maternal Aunt     Breast cancer  . Cancer Maternal Aunt     Breast cancer  . Cancer Cousin     Breast Cancer    Social History:  Was independent prior to stroke 03/2015. Per  reports that she has been smoking.  She has never used smokeless tobacco. Per reports that she drinks  alcohol. She reports that she does not use illicit drugs.    Allergies  Allergen Reactions  . Codeine     Nausea?-- "made her sick"  . Oxycodone Nausea Only    Sick   . Oxycontin [Oxycodone Hcl] Nausea Only    Sick     Medications Prior to Admission  Medication Sig Dispense Refill  . acetaminophen (TYLENOL) 160 MG/5ML solution Take 10.2-20.3 mLs (325-650 mg total) by mouth every 4 (four) hours as needed for mild pain. 120 mL 0  . atorvastatin (LIPITOR) 20 MG tablet Take 20 mg by mouth at bedtime.    . clopidogrel (PLAVIX) 75 MG tablet Take 1 tablet (75 mg total) by mouth daily. 30 tablet 2  . feeding supplement, ENSURE, (ENSURE) PUDG Take 1 Container by mouth 3 (three) times daily between meals.    . folic acid (FOLVITE) 1 MG tablet Take 1 mg by mouth daily.    Marland Kitchen glipiZIDE (GLUCOTROL) 5 MG tablet Take by mouth daily before breakfast.    . hydrocerin (EUCERIN) CREA Apply 1 application topically 2 (two) times daily. To bilateral feet  0  . Melatonin 3 MG TABS Take 6 mg by mouth at bedtime.    . Multiple Vitamins-Minerals (MULTIVITAMINS THER. W/MINERALS) TABS tablet Take 1 tablet by mouth daily.    Marland Kitchen senna-docusate (SENNA PLUS) 8.6-50 MG tablet Take 1 tablet by mouth 2 (two) times daily as needed for mild constipation.    . sodium chloride (OCEAN) 0.65 % SOLN nasal spray Place 1 spray into both nostrils 3 (three) times daily. To help with nose bleeds  0  . thiamine 100 MG tablet Take 100 mg by mouth daily.    Marland Kitchen zinc sulfate 220 MG capsule Take 220 mg by mouth daily at 6 PM.      Home: Home Living Family/patient expects to be discharged to:: Inpatient rehab Living Arrangements: Spouse/significant other Available Help at Discharge: Family, Available 24 hours/day Type of Home: House Home Access: Stairs to enter [employer] of Steps: 3 Entrance Stairs-Rails: Right, Left, Can reach both Home Layout: Two level, Able to live on main level with bedroom/bathroom Bathroom  Shower/Tub: Tub/shower unit, Air cabin crew Accessibility: Yes  Lives With: Spouse, Son, Other (Comment) (spouse, [name], son, [name] and son's girlfriend, Amy)   Functional History: Prior Function Level of Independence: Independent Comments: On CIR - husband has been educated on tranfers, w/c mobility.  Functional Status:  Mobility: Bed Mobility Overal bed mobility: Needs Assistance Bed Mobility: Rolling, Sidelying to Sit, Sit to Sidelying Rolling: Mod assist Sidelying to sit: Mod assist Sit to sidelying: Mod assist, +2 for physical assistance General bed mobility comments: rolls right with min assist and to left with total assist.  Transfers Overall transfer level: Needs assistance Equipment used: None Transfers: Squat Pivot Transfers Sit to Stand:  (unable with 1 person) Stand pivot transfers: +2 physical assistance, Mod assist Squat  pivot transfers: Max assist General transfer comment: Pt needed max assist of 2 persons to achieve sit to stand and mod assist of 2 for stand pivot transfer.  Blocked right knee and pt able to step left LE around for pivot transfer.  Pt with assist for steadying and use of pad to assist with hip extension.  Pt needed assist to control descent into chair as well.        ADL: ADL Overall ADL's : Needs assistance/impaired Eating/Feeding: NPO Grooming: Moderate assistance (supported sitting) Upper Body Bathing: Moderate assistance (supported sitting) Lower Body Bathing: Maximal assistance (with Max A +2 sit<>stand) Upper Body Dressing : Maximal assistance (supported sitting) Lower Body Dressing: Total assistance (with Max A +2 sit<>stand) Toilet Transfer: Moderate assistance, +2 for physical assistance, Stand-pivot, BSC Toileting- Clothing Manipulation and Hygiene: Total assistance (max A+2 sit<>stand) Functional mobility during ADLs: Maximal assistance General ADL Comments: Focus of session on family education.  Husband states he has DME at home. He staes the w/c was dropped and the 1/2 lap tray for RUE support was broken and that the hospital bed does not raise and lower like "our beds". Asked husband to demonstrte trnfer technique from bed - chair. Husband unable to transfer pt. Therapist demonstrated squat pivot transfer from bed - chair. Husband then asked to trnasfer pt back to bed. Husband unable to transfer pt back to bed toward her strong side. On observation, husband states that people transfer her all "different ways", Pt and husband apparently frustrated with situation.   Cognition: Cognition Overall Cognitive Status: Impaired/Different from baseline Orientation Level: Other (comment) (expressive aphasia ) Cognition Arousal/Alertness: Awake/alert Behavior During Therapy: WFL for tasks assessed/performed Overall Cognitive Status: Impaired/Different from baseline Area of Impairment: Following commands, Awareness, Problem solving Following Commands: Follows one step commands with increased time Awareness: Emergent Problem Solving: Slow processing, Decreased initiation, Difficulty sequencing, Requires verbal cues, Requires tactile cues General Comments: apparently apraxic Difficult to assess due to: Impaired communication   Blood pressure 149/103, pulse 92, temperature 97.4 F (36.3 C), temperature source Axillary, resp. rate 18, height 5' 10" (1.778 m), weight 80.1 kg (176 lb 9.4 oz), SpO2 99 %. Physical Exam  Nursing note and vitals reviewed. Constitutional: She appears well-developed and well-nourished.  Up in bed. Smiling and welcoming.   HENT:  Head: Normocephalic and atraumatic.  Poor dentition  Eyes: Conjunctivae and EOM are normal. Right eye exhibits no discharge. Left eye exhibits no discharge. No scleral icterus.  Anisocoria right > left pupil.   Neck: Normal range of motion. Neck supple.  Prior tracheostomy stoma well healed.   Cardiovascular: Normal rate and regular rhythm.    No murmur heard. Respiratory: Effort normal and breath sounds normal. No respiratory distress. She exhibits no tenderness.  GI: Bowel sounds are normal. She exhibits no distension. There is no tenderness.  Musculoskeletal:  Right knee painful to ROM with crepitus--improves with ROM.  Right hip and knee muscle tightness likely due to disuse.  RUE painful to ROM--question due to IV discomfort and/or disuse.  Min edema RUE/RLE  Neurological: She is alert.  Continues to make volitional stridorous sounds.  Dense right hemiparesis unchanged.  LUE/LLE 4+/5 proximal to distal RUE/RLE: 0/5 proximal to distal Dysarthria.   She is able to follow occasional simple motor commands with visual and tactile cues and continues with perseverative behaviors.  Continues to be limited by apraxia and expressive/receptive aphasia. Still tends to answer yes to most questions but now able to express an occasional word. Perseverative  behavior noted.  Skin: Skin is warm and dry.  Psychiatric: Her affect is inappropriate. Her speech is delayed. Cognition and memory are impaired.    Results for orders placed or performed during the hospital encounter of 06/07/15 (from the past 48 hour(s))  Glucose, capillary     Status: Abnormal   Collection Time: 06/11/15  5:22 PM  Result Value Ref Range   Glucose-Capillary 205 (H) 65 - 99 mg/dL  Glucose, capillary     Status: Abnormal   Collection Time: 06/11/15  9:05 PM  Result Value Ref Range   Glucose-Capillary 167 (H) 65 - 99 mg/dL  Glucose, capillary     Status: Abnormal   Collection Time: 06/12/15  1:13 AM  Result Value Ref Range   Glucose-Capillary 172 (H) 65 - 99 mg/dL   Comment 1 Notify RN   Glucose, capillary     Status: Abnormal   Collection Time: 06/12/15  4:25 AM  Result Value Ref Range   Glucose-Capillary 173 (H) 65 - 99 mg/dL  Basic metabolic panel     Status: Abnormal   Collection Time: 06/12/15  6:40 AM  Result Value Ref Range   Sodium 141 135 - 145  mmol/L   Potassium 3.9 3.5 - 5.1 mmol/L   Chloride 101 101 - 111 mmol/L   CO2 31 22 - 32 mmol/L   Glucose, Bld 188 (H) 65 - 99 mg/dL   BUN 8 6 - 20 mg/dL   Creatinine, Ser 0.32 (L) 0.44 - 1.00 mg/dL   Calcium 9.4 8.9 - 10.3 mg/dL   GFR calc non Af Amer >60 >60 mL/min   GFR calc Af Amer >60 >60 mL/min    Comment: (NOTE) The eGFR has been calculated using the CKD EPI equation. This calculation has not been validated in all clinical situations. eGFR's persistently <60 mL/min signify possible Chronic Kidney Disease.    Anion gap 9 5 - 15  Magnesium     Status: None   Collection Time: 06/12/15  6:40 AM  Result Value Ref Range   Magnesium 1.9 1.7 - 2.4 mg/dL  Glucose, capillary     Status: Abnormal   Collection Time: 06/12/15  7:56 AM  Result Value Ref Range   Glucose-Capillary 156 (H) 65 - 99 mg/dL  Glucose, capillary     Status: Abnormal   Collection Time: 06/12/15 11:39 AM  Result Value Ref Range   Glucose-Capillary 175 (H) 65 - 99 mg/dL  Glucose, capillary     Status: Abnormal   Collection Time: 06/12/15  4:46 PM  Result Value Ref Range   Glucose-Capillary 152 (H) 65 - 99 mg/dL  Glucose, capillary     Status: Abnormal   Collection Time: 06/12/15  9:01 PM  Result Value Ref Range   Glucose-Capillary 171 (H) 65 - 99 mg/dL  Glucose, capillary     Status: Abnormal   Collection Time: 06/13/15 12:12 AM  Result Value Ref Range   Glucose-Capillary 171 (H) 65 - 99 mg/dL  Glucose, capillary     Status: Abnormal   Collection Time: 06/13/15  4:45 AM  Result Value Ref Range   Glucose-Capillary 172 (H) 65 - 99 mg/dL   Comment 1 Notify RN   Glucose, capillary     Status: Abnormal   Collection Time: 06/13/15  8:06 AM  Result Value Ref Range   Glucose-Capillary 163 (H) 65 - 99 mg/dL  Glucose, capillary     Status: Abnormal   Collection Time: 06/13/15  9:24 AM  Result Value  Ref Range   Glucose-Capillary 179 (H) 65 - 99 mg/dL  Glucose, capillary     Status: Abnormal   Collection  Time: 06/13/15 12:19 PM  Result Value Ref Range   Glucose-Capillary 156 (H) 65 - 99 mg/dL   No results found.     Medical Problem List and Plan 1. 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  2.  PE/RLL DVT /Anticoagulation: Pharmaceutical: Xarelto  3. Pain Management: tylenol prn effective for chronic pain. Patient does not show any signs of distress.  4. Mood: Has lot of underlying psycho-social issues but patient lacks awareness/insight at this time. Pleasant and smiling most of the time. Team and LCSW to work with family to educate on monitoring mood and help facilitate discharge.  5. Neuropsych: This patient is not capable of making decisions on her own behalf. 6. Skin/Wound Care: Maintain adequate nutrition and hydration status.  Continue tube feeds. Routine pressure relief measures.  7. Fluids/Electrolytes/Nutrition: Continue tube feeds tid due to  8. HTN: Blood pressures trending upwards again. Continue Prinivil, coreg bid and  9. DM type 2: Monitor BS ac/hs. Resume Levemir titrate as indicated. Use SSI for elevated BS.  10.  Sepsis/SIRS:  Was treated for E coli UTI prior to discharge on 12/15.   Blood cultures X 2 negative. Patient did not have fevers and elevated WBC likely reactive due to DVT/PE. Has completed 5 days of IV antibiotics and 24 hours of oral antibiotic--will continue additional day for a week treatment and then d/c to avoid side effects/complications.   11. Recent GIB?: Resume pepcid for now. Will resume PPI once antibiotics  Completed.    Post Admission Physician Evaluation: 1. Functional deficits secondary to left MCA infarct/PE. 2. Patient is admitted to receive collaborative, interdisciplinary care between the physiatrist, rehab nursing staff, and therapy team. 3. Patient's level of medical complexity and substantial therapy needs in context of that medical necessity cannot be provided at a lesser intensity of care  such as a SNF. 4. Patient has experienced substantial functional loss from his/her baseline which was documented above under the "Functional History" and "Functional Status" headings.  Judging by the patient's diagnosis, physical exam, and functional history, the patient has potential for functional progress which will result in measurable gains while on inpatient rehab.  These gains will be of substantial and practical use upon discharge  in facilitating mobility and self-care at the household level. 5. Physiatrist will provide 24 hour management of medical needs as well as oversight of the therapy plan/treatment and provide guidance as appropriate regarding the interaction of the two. 6. 24 hour rehab nursing will assist with bladder management, bowel management, safety, skin/wound care, disease management, medication administration, pain management and patient education  and help integrate therapy concepts, techniques,education, etc. 7. PT will assess and treat for/with: Lower extremity strength, range of motion, stamina, balance, functional mobility, safety, adaptive techniques and equipment, woundcare, coping skills, pain control, education.   Goals are: Mod/Min A . 8. OT will assess and treat for/with: ADL's, functional mobility, safety, upper extremity strength, adaptive techniques and equipment, wound mgt, ego support, and community reintegration.   Goals are: Mod/Min A. Therapy may not proceed with showering this patient. 9. SLP will assess and treat for/with: speech, language, cognition, processing.  Goals are: Mod/Min A. 10. Case Management and Social Worker will assess and treat for psychological issues and discharge planning. 11. Team conference will be held weekly to assess progress toward goals and  to determine barriers to discharge. 12. Patient will receive at least 3 hours of therapy per day at least 5 days per week. 13. ELOS: 5-7 days.       14. Prognosis:  fair     Delice Lesch,  MD 06/13/2015

## 2015-06-13 NOTE — PMR Pre-admission (Addendum)
PMR Admission Coordinator Pre-Admission Assessment  Patient: Kerry [name] is an 59 y.o., female MRN: [id number] DOB: Oct 09, 1955 Height: 5' 10"  (177.8 cm) Weight: 80.1 kg (176 lb 9.4 oz)              Insurance Information HMO: yes    PPO:      PCP:      IPA:      80/20:      OTHER: [employer] PRIMARY: [place]      Policy#: [id number]      Subscriber: pt CM Name: Parthenia Ames      Phone#: [phone]     Fax#: [phone] fu with Jil bryant at phone [phone] fax [phone] auth for 7 days Pre-Cert#: [id number]      Employer: unemployed Benefits:  Phone #: [phone]     Name: 06/13/15 Heidi Eff. Date: 06/23/14     Deduct: $3600/met all      Out of Pocket Max: $5000/met all      Life Max: none CIR: 80% until OOP met then covers 100%      SNF: 80% 60 days Outpatient: $20 copay per visit     Co-Pay: 23 visits PT, 23 visits OT, 30 visits SLP Home Health: 80%      Co-Pay: 20% no visit limit DME: 80%     Co-Pay: 20% Providers: in network visits only  SECONDARY: none        Medicaid Application Date:       Case Manager:  Disability Application Date:       Case Worker:   Emergency Contact Information Contact Information    Name Relation Home Work Mobile   [place] Spouse [phone]  [phone]   Iman, Reinertsen   [phone]   Kazuko, Clemence   [phone]     Current Medical History  Patient Admitting Diagnosis: left MCA infarct, saddle PE/DVT  History of Present Illness: 59 y.o. female with PMH of hypertension, hyperlipidemia, diabetes mellitus, GERD, CAD, breast cancer (s/P left mastectomy), DDD, carpal tunnel syndrome, recent large left MCA territory infarct with resolving associated hemorrhage after tPA treatment, s/p of PEG placement, right-sided hemiparalysis, began inpt acute rehab 11/23. Where she was admitted from [facility].  On 06/07/15 pt developed tachycardia and hypotension. Cardiology was consulted, Dr. [name]  suspected that patient may have PE, which was confirmed by CTA. PCCM was consulted and recommended to start IV heparin. [facility] admitted pt to acute hospital. Pt had aphasia, but could tell that she was shortness of breath and had mild chest pain.  She was being treated for UTI with ancef since 12/13. CTA showed subsegmental right lower lobe pulmonary emboli. No evidence of right heart strain. Possible filling defect in the left atrial appendage.  Patient was found to have hypotension with blood pressure 82/46, potassium 3.1, troponin 0.33, lactate 1.1, tachycardia, tachypnea, temperature 99.8, WBC 15.4. Patient was admitted to stepdown bed for further eval and treatment.  Patient admitted from CIR for PE/DVT, was started on heparin and transition to [place]. ENT did see patient 06/09/15 and recommended outpatient follow-up for  bilateral vocal fold paresis/hypomobility. Felt to be  a sequela of her recent CVA. Since she does not appear to be in any respiratory distress from this and is able to lie flat with no difficulty, and she passed her swallow eval, can likely just observe with oxygen as needed. Can also see bilateral vocal fold paralysis from brain edema and cerebellar tonsillar herniation as in Chiari malformation so MRI  or CT head may be indicated. Would humidify the nasal cannula oxygen to prevent epistaxis. Once she is more stable can consider outpatient referral to one of the Laryngologists at one of the the Universities to consider laser arytenoidectomy/partial cordotomy which would give her a larger laryngeal airway if her stridor was persistent.    Past Medical History  Past Medical History  Diagnosis Date  . Hypertension   . Coronary artery disease   . Diabetes mellitus   . Breast cancer ([place]) 03/10/2007    Left breat  . Degenerative disc disease, cervical   . Degenerative disc disease, lumbar   . Fibromyalgia   . Hyperlipidemia   . Carpal tunnel syndrome on right   .  PE (pulmonary embolism)   . UTI (lower urinary tract infection)   . GIB (gastrointestinal bleeding)   . Elevated troponin     Family History  family history includes Cancer in her cousin, maternal aunt, maternal aunt, and mother; Diabetes in her sister.  Prior Rehab/Hospitalizations:  Has the patient had major surgery during 100 days prior to admission? Yes  Current Medications   Current facility-administered medications:  .  acetaminophen (TYLENOL) suppository 650 mg, 650 mg, Rectal, Q6H PRN, Ivor Costa, MD .  antiseptic oral rinse (CPC / CETYLPYRIDINIUM CHLORIDE 0.05%) solution 7 mL, 7 mL, Mouth Rinse, q12n4p, Ivor Costa, MD, 7 mL at 06/12/15 1544 .  atorvastatin (LIPITOR) tablet 40 mg, 40 mg, Oral, q1800, Ivor Costa, MD, 40 mg at 06/12/15 1716 .  bacitracin ointment, , Topical, TID, [name], MD .  bisacodyl (DULCOLAX) suppository 10 mg, 10 mg, Rectal, Daily PRN, Ivor Costa, MD .  carvedilol (COREG) tablet 12.5 mg, 12.5 mg, Oral, BID WC, Maryann Mikhail, DO, 12.5 mg at 06/13/15 0941 .  cephALEXin (KEFLEX) 250 MG/5ML suspension 500 mg, 500 mg, Per Tube, 4 times per day, Orson Eva, MD, 500 mg at 06/13/15 0505 .  chlorhexidine (PERIDEX) 0.12 % solution 15 mL, 15 mL, Mouth Rinse, BID, Ivor Costa, MD, 15 mL at 06/13/15 [phone] .  feeding supplement (PRO-STAT SUGAR FREE 64) liquid 30 mL, 30 mL, Oral, Daily, Ivor Costa, MD, 30 mL at 06/13/15 [phone] .  feeding supplement (VITAL AF 1.2 CAL) liquid 237 mL, 237 mL, Per Tube, TID, Ivor Costa, MD, Last Rate: 50 mL/hr at 06/12/15 2155, 237 mL at 06/12/15 2155 .  folic acid (FOLVITE) tablet 1 mg, 1 mg, Oral, Daily, Ivor Costa, MD, 1 mg at 06/13/15 0940 .  free water 200 mL, 200 mL, Per Tube, 3 times per day, Ivor Costa, MD, 200 mL at 06/13/15 0505 .  hydrALAZINE (APRESOLINE) injection 10 mg, 10 mg, Intravenous, Q6H PRN, Maryann Mikhail, DO, 10 mg at 06/09/15 1533 .  insulin aspart (novoLOG) injection 0-9 Units, 0-9 Units, Subcutaneous, 6 times per day, Orson Eva, MD, 2 Units at 06/13/15 1330 .  ipratropium-albuterol (DUONEB) 0.5-2.5 (3) MG/3ML nebulizer solution 3 mL, 3 mL, Nebulization, Q6H PRN, Ivor Costa, MD .  lisinopril (PRINIVIL,ZESTRIL) tablet 10 mg, 10 mg, Oral, Daily, Maryann Mikhail, DO, 10 mg at 06/13/15 0941 .  multivitamin with minerals tablet 1 tablet, 1 tablet, Oral, Daily, Ivor Costa, MD, 1 tablet at 06/13/15 [phone] .  nicotine (NICODERM CQ - dosed in mg/24 hr) patch 7 mg, 7 mg, Transdermal, Daily, Ivor Costa, MD, 7 mg at 06/13/15 0947 .  nitroGLYCERIN (NITROSTAT) SL tablet 0.4 mg, 0.4 mg, Sublingual, Q5 min PRN, Ivor Costa, MD .  Rivaroxaban (XARELTO) tablet 15 mg, 15 mg, Oral, BID  WC, 15 mg at 06/13/15 0940 **FOLLOWED BY** [START ON 06/30/2015] rivaroxaban (XARELTO) tablet 20 mg, 20 mg, Oral, Q supper, Lauren D Bajbus, RPH .  senna-docusate (Senokot-S) tablet 1 tablet, 1 tablet, Oral, BID PRN, Ivor Costa, MD .  sodium chloride 0.9 % injection 3 mL, 3 mL, Intravenous, Q12H, Ivor Costa, MD, 3 mL at 06/12/15 2154 .  thiamine (VITAMIN B-1) tablet 100 mg, 100 mg, Oral, Daily, Ivor Costa, MD, 100 mg at 06/13/15 0940  Patients Current Diet: DIET DYS 2 Room service appropriate?: Yes; Fluid consistency:: Nectar Thick  Precautions / Restrictions Precautions Precautions: Fall Precautions/Special Needs: Swallowing Precaution Comments: Peg, Rt shoulder subluxation Restrictions Weight Bearing Restrictions: No   Has the patient had 2 or more falls or a fall with injury in the past year?No  Prior Activity Level Community (5-7x/wk): pt was completely independent and driving prior to initial admit 04/11/2015. Was admitted 04/11/15 to acute hospital for left MCA infarct, discharged to  11/4/16Ltach and then readmitted to acute inpt rehab 11/23 then d/c'd to acute hospital 12/15.  Home Assistive Devices / Equipment Home Assistive Devices/Equipment: None  Prior Device Use: Indicate devices/aids used by the patient prior to current illness, exacerbation or  injury? None of the above  Prior Functional Level Prior Function Level of Independence: Independent Comments: On CIR - husband has been educated on tranfers, w/c mobility.  Self Care: Did the patient need help bathing, dressing, using the toilet or eating?  Independent  Indoor Mobility: Did the patient need assistance with walking from room to room (with or without device)? Independent  Stairs: Did the patient need assistance with internal or external stairs (with or without device)? Independent  Functional Cognition: Did the patient need help planning regular tasks such as shopping or remembering to take medications? Independent  Current Functional Level Cognition  Overall Cognitive Status: Impaired/Different from baseline Difficult to assess due to: Impaired communication Orientation Level: Other (comment) (expressive aphasia ) Following Commands: Follows one step commands with increased time General Comments: apparently apraxic    Extremity Assessment (includes Sensation/Coordination)  Upper Extremity Assessment: RUE deficits/detail RUE Deficits / Details: flaccid with subluxation RUE Coordination: decreased gross motor, decreased fine motor  Lower Extremity Assessment: RLE deficits/detail RLE Deficits / Details: flaccid, no movement noted    ADLs  Overall ADL's : Needs assistance/impaired Eating/Feeding: NPO Grooming: Moderate assistance (supported sitting) Upper Body Bathing: Moderate assistance (supported sitting) Lower Body Bathing: Maximal assistance (with Max A +2 sit<>stand) Upper Body Dressing : Maximal assistance (supported sitting) Lower Body Dressing: Total assistance (with Max A +2 sit<>stand) Toilet Transfer: Moderate assistance, +2 for physical assistance, Stand-pivot, BSC Toileting- Clothing Manipulation and Hygiene: Total assistance (max A+2 sit<>stand) Functional mobility during ADLs: Maximal assistance General ADL Comments: Focus of session on family  education. Husband states he has DME at home. He staes the w/c was dropped and the 1/2 lap tray for RUE support was broken and that the hospital bed does not raise and lower like "our beds". Asked husband to demonstrte trnfer technique from bed - chair. Husband unable to transfer pt. Therapist demonstrated squat pivot transfer from bed - chair. Husband then asked to trnasfer pt back to bed. Husband unable to transfer pt back to bed toward her strong side. On observation, husband states that people transfer her all "different ways", Pt and husband apparently frustrated with situation.     Mobility  Overal bed mobility: Needs Assistance Bed Mobility: Rolling, Sidelying to Sit, Sit to Sidelying Rolling: Mod assist Sidelying  to sit: Mod assist Sit to sidelying: Mod assist, +2 for physical assistance General bed mobility comments: rolls right with min assist and to left with total assist.     Transfers  Overall transfer level: Needs assistance Equipment used: None Transfers: Squat Pivot Transfers Sit to Stand:  (unable with 1 person) Stand pivot transfers: +2 physical assistance, Mod assist Squat pivot transfers: Max assist General transfer comment: Pt needed max assist of 2 persons to achieve sit to stand and mod assist of 2 for stand pivot transfer.  Blocked right knee and pt able to step left LE around for pivot transfer.  Pt with assist for steadying and use of pad to assist with hip extension.  Pt needed assist to control descent into chair as well.      Ambulation / Gait / Stairs / Office manager / Balance Dynamic Sitting Balance Sitting balance - Comments: Pt initially total assist to sit EOB but with some facilitation was able to sit with min assist on EOB.  Pt at times pushes with her left UE.  Pt with better sitting balance after leaning on left elbow.    Balance Overall balance assessment: Needs assistance Sitting-balance support: Single extremity supported, Feet  supported Sitting balance-Leahy Scale: Poor Sitting balance - Comments: Pt initially total assist to sit EOB but with some facilitation was able to sit with min assist on EOB.  Pt at times pushes with her left UE.  Pt with better sitting balance after leaning on left elbow.    Postural control: Left lateral lean, Posterior lean Standing balance support: Single extremity supported Standing balance-Leahy Scale: Zero    Special needs/care consideration Oxygen 2 liters [place]                    Bowel mgmt:incontinent Bladder mgmt: incontinent Diabetic mgmt Hgb A1c 11.4 11/16   Previous Home Environment Living Arrangements: Spouse/significant other  Lives With: Spouse, Son, Other (Comment) (spouse, [name], son, [name] and son's girlfriend, Amy) Available Help at Discharge: Family, Available 24 hours/day Type of Home: House Home Layout: Two level, Able to live on main level with bedroom/bathroom Home Access: Stairs to enter Entrance Stairs-Rails: Right, Left, Can reach both Entrance Stairs-Number of Steps: 3 Bathroom Shower/Tub: Tub/shower unit, Architectural technologist: Standard Bathroom Accessibility: Yes How Accessible: Accessible via walker [place]: Other (Comment)  Discharge Living Setting Plans for Discharge Living Setting: Patient's home, Lives with (comment) (Lives with spouse, [name], son, [name] and his gf, Amy) Type of Home at Discharge: House Discharge Home Layout: Two level, Able to live on main level with bedroom/bathroom Discharge Home Access: Stairs to enter Entrance Stairs-Rails: Right, Left, Can reach both Entrance Stairs-Number of Steps: 3 Discharge Bathroom Shower/Tub: Tub/shower unit, Curtain Discharge Bathroom Toilet: Standard Discharge Bathroom Accessibility: Yes How Accessible: Accessible via walker Does the patient have any problems obtaining your medications?: No  Social/Family/Support Systems Patient Roles: Spouse, Parent Contact Information:  [name], spouse and son, [name], who Matt coordinates the entire family Anticipated Caregiver: Family Anticipated Ambulance person Information: see above Ability/Limitations of Caregiver: Tamala Fothergill, and [facility] will provide 24/7 physical care of pt Caregiver Availability: 24/7 Discharge Plan Discussed with Primary Caregiver: Yes Is Caregiver In Agreement with Plan?: Yes Does Caregiver/Family have Issues with Lodging/Transportation while Pt is in Rehab?: No  Goals/Additional Needs Patient/Family Goal for Rehab: Min to mod assist with PT, OT, and SLP Expected length of stay: [facility] 5-7 days to complete  family education as well as pt lost some funcitonal mobility and diet tolerance when readmitted to acute 12/15 unitl 12/21 Pt/Family Agrees to Admission and willing to participate: Yes Program Orientation Provided & Reviewed with Pt/Caregiver Including Roles  & Responsibilities: Yes  Decrease burden of Care through IP rehab admission: n/a  Possible need for SNF placement upon discharge: not anticipated  Patient Condition: This patient's medical and functional status is appropriate  for intensive rehabilitative care in an inpatient rehabilitation facility. See "History of Present Illness" (above) for medical update.Patient is overall mod to max assist.. Patient's medical and functional status update has been discussed with the Rehabilitation physician since pt readmitted to acute hospital from [facility] 06/07/15 and patient remains appropriate for inpatient rehabilitation. Will admit to inpatient rehab today.  Preadmission Screen Completed By:  Cleatrice Burke, 06/13/2015 3:28 PM ______________________________________________________________________   Discussed status with Dr. Posey Pronto on 06/13/15 at  1512 and received telephone approval for admission today.  Admission Coordinator:  Cleatrice Burke, time 1121 Date 06/13/2015.

## 2015-06-13 NOTE — Progress Notes (Signed)
Pt discharged to CIR. Pt. Is alert and oriented. Pt is stable per baseline. Report called by day RN. Discharge plan appropriate and in place.

## 2015-06-13 NOTE — Interval H&P Note (Signed)
Kerry [name] was admitted today to Inpatient Rehabilitation with the diagnosis of left MCA infarct and PE.  The patient's history has been reviewed, patient examined, and there is no change in status.  Patient continues to be appropriate for intensive inpatient rehabilitation.  I have reviewed the patient's chart and labs.  Questions were answered to the patient's satisfaction. The PAPE has been reviewed and assessment remains appropriate.  Kerry [name] 06/13/2015, 10:24 PM

## 2015-06-13 NOTE — Progress Notes (Addendum)
Occupational Therapy Treatment Patient Details Name: Kerry [name] MRN: [id number] DOB: [birth year] Today's Date: 06/13/2015    History of present illness pt presents with dense L MCA Infarct involving L Temporal, Frontoparietal, and L Basal Ganglia regions and was started on TPA, but unable to complete full dose due to hypertension, but did recieve IR intervention. Pt extubated 10/30. pt with hx of DM, HTN, Breast CA s/p Mastectomy, and Fibromyalgia.  Patient transferred to [facility] and then Inpatient Rehab.  Transferred back to acute on 06/07/15 due to PE.   OT comments  Pt seen primarily for family education with emphasis on transfer training. Husband present and apparently frustrated regarding D/C plan. Husband states that he observed therapy with transfers but had limited hands on training while on rehab prior to pt becoming ill and having to transfer back to acute care. Pt has only been seen 1 time since readmission to acute and demonstrates a decline in functional mobility. Husband is unable to safely transfer pt at this time even after education due to complexity involved with safe handling techniques. Husband also states that has not yet administered medicines through pt's PEG tube. Pt/family need to return to CIR for intensive rehab to facilitate a safe return home with 24/7 assistance of family. If ins denies CIR, family will need a hoyer lift to safely mobilize pt at home. Pt also needs a sling for RUE to use during transfers only due to painful and subluxed R shoulder. They will also need HHOT/PT/RN and HH Aide. Will continue to follow acutely.   Follow Up Recommendations  CIR;Supervision/Assistance - 24 hour    Equipment Recommendations  3 in 1 bedside comode;Wheelchair (measurements OT);Wheelchair cushion (measurements OT);Hospital bed;Other (comment) (hoyer)    Recommendations for Other Services Rehab consult    Precautions / Restrictions  Precautions Precautions: Fall Precaution Comments: Peg, Rt shoulder subluxation Required Braces or Orthoses: Other Brace/Splint (PRAFO)       Mobility Bed Mobility Overal bed mobility: Needs Assistance Bed Mobility: Rolling;Sidelying to Sit;Sit to Sidelying Rolling: Mod assist Sidelying to sit: Mod assist     Sit to sidelying: Mod assist;+2 for physical assistance    Transfers Overall transfer level: Needs assistance   Transfers: Squat Pivot Transfers Sit to Stand:  (unable with 1 person)   Squat pivot transfers: Max assist          Balance     Sitting balance-Leahy Scale: Poor                             ADL Overall ADL's : Needs assistance/impaired                                     Functional mobility during ADLs: Maximal assistance General ADL Comments: Focus of session on family education. Husband states he has DME at home. He staes the w/c was dropped and the 1/2 lap tray for RUE support was broken and that the hospital bed does not raise and lower like "our beds". Asked husband to demonstrte trnfer technique from bed - chair. Husband unable to transfer pt. Therapist demonstrated squat pivot transfer from bed - chair. Husband then asked to trnasfer pt back to bed. Husband unable to transfer pt back to bed toward her strong side. On observation, husband states that people transfer her all "different ways", Pt and husband apparently frustrated  with situation.       Vision                     Perception     Praxis      Cognition   Behavior During Therapy: [facility] for tasks assessed/performed Overall Cognitive Status: Impaired/Different from baseline Area of Impairment: Following commands;Awareness;Problem solving        Following Commands: Follows one step commands with increased time   Awareness: Emergent Problem Solving: Slow processing;Decreased initiation;Difficulty sequencing;Requires verbal cues;Requires tactile  cues General Comments: apparently apraxic    Extremity/Trunk Assessment               Exercises Other Exercises Other Exercises: Demonstrated RUE PROM. no active movement. PROM WFL with the exception of @ 75 FF shoulder due to pain.   Shoulder Instructions       General Comments      Pertinent Vitals/ Pain       Pain Assessment: Faces Faces Pain Scale: Hurts even more Pain Location: R shoulder Pain Descriptors / Indicators: Grimacing;Guarding Pain Intervention(s): Limited activity within patient's tolerance  Home Living                                          Prior Functioning/Environment              Frequency Min 3X/week     Progress Toward Goals  OT Goals(current goals can now be found in the care plan section)  Progress towards OT goals: Progressing toward goals  Acute Rehab OT Goals Patient Stated Goal: husband - for pt to get rehab OT Goal Formulation: Patient unable to participate in goal setting Time For Goal Achievement: 06/25/15 Potential to Achieve Goals: Fair ADL Goals Additional ADL Goal #1: Pt will be min guard A to sit EOB in prep for transfers Additional ADL Goal #2: Pt will be Mod A stand pivot  transfer to 3n1 Additional ADL Goal #3: Pt will be able to come up to sit with min A in prep for transfers Additional ADL Goal #4: family will be able to perform all about goals with pt  Plan Discharge plan remains appropriate    Co-evaluation                 End of Session Equipment Utilized During Treatment: Gait belt   Activity Tolerance Patient tolerated treatment well   Patient Left in bed;with call bell/phone within reach;with bed alarm set;with family/visitor present   Nurse Communication Mobility status;Other (comment) (concerns regarding D/C)        Time: 1345-1430 OT Time Calculation (min): 45 min  Charges: OT General Charges $OT Visit: 1 Procedure OT Treatments $Self Care/Home Management : 38-52  mins  Linard Daft,HILLARY 06/13/2015, 2:57 PM   [facility], OTR/L  [id number] 06/13/2015 Roland, OTR/L  [phone] 06/13/2015

## 2015-06-13 NOTE — Progress Notes (Addendum)
Per rep at [facility]   15mg  will need auth for more than 1 per day, [phone]  20mg : no auth needed   Both are listed at preferred brand name: tier 2, $40 for 30 day retail/ or per rx written    Called pre auth phone number they state this does not require prior auth.      Previous Messages

## 2015-06-13 NOTE — Progress Notes (Signed)
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 Pt extubated 10/30. pt with hx of DM, HTN, Breast CA s/p Mastectomy, and Fibromyalgia.  Patient transferred to [facility] and then Inpatient Rehab.  Transferred back to acute on 06/07/15 due to PE.    PT Comments    Pt was seen for review of all exercises and assisted RLE with them as pt did not want to get up until her nurse could come help her get bathed and dried.  Pt is demonstrating some awareness of RLE mm function but did not attend completely to RLE.  Will need to focus completely to get more active function on RLE.  PRAFO replaced on R foot and did work with pt on positioning RUE as well  Follow Up Recommendations  CIR;Supervision/Assistance - 24 hour     Equipment Recommendations  Wheelchair (measurements PT);Wheelchair cushion (measurements PT);Hospital bed    Recommendations for Other Services Rehab consult     Precautions / Restrictions Precautions Precautions: Fall Precaution Comments: Peg, Rt shoulder subluxation Required Braces or Orthoses: Other Brace/Splint (R foot PRAFO) Restrictions Weight Bearing Restrictions: No    Mobility  Bed Mobility Overal bed mobility: Needs Assistance                Transfers Overall transfer level: Needs assistance               General transfer comment: declined as she was waiting for nursing to assist her to bathe and get dry  Ambulation/Gait                 Stairs            Wheelchair Mobility    Modified Rankin (Stroke Patients Only)       Balance                                    Cognition  Arousal/Alertness: Awake/alert Behavior During Therapy: WFL for tasks assessed/performed Overall Cognitive Status: Impaired/Different from baseline Area of Impairment: Memory;Following commands;Safety/judgement;Awareness;Problem solving;Orientation;Attention Orientation Level: Time;Situation Current Attention Level: Alternating Memory: Decreased recall of precautions;Decreased short-term memory Following Commands: Follows one step commands inconsistently Safety/Judgement: Decreased awareness of safety;Decreased awareness of deficits Awareness: Intellectual Problem Solving: Slow processing;Decreased initiation;Difficulty sequencing;Requires verbal cues;Requires tactile cues General Comments: apparently apraxic    Exercises General Exercises - Lower Extremity Ankle Circles/Pumps: PROM;Both;10 reps;AROM Quad Sets: AAROM;AROM;Both;10 reps Short Arc Quad: AAROM;AROM;Both;10 reps Heel Slides: AAROM;Both;10 reps;AROM Hip ABduction/ADduction: PROM;AROM;Both;15 reps Straight Leg Raises: AAROM;Both;15 reps    General Comments        Pertinent Vitals/Pain Pain Assessment: Faces Pain Score: 0-No pain    Home Living                      Prior Function            PT Goals (current goals can now be found in the care plan section) Acute Rehab PT Goals Patient Stated Goal: husband - for pt to get rehab Progress towards PT goals: Progressing toward goals    Frequency  Min 3X/week    PT Plan Discharge plan  needs to be updated    Co-evaluation             End of Session   Activity Tolerance: Patient limited by fatigue Patient left: in bed;with call bell/phone within reach;with bed alarm set     Time: [id number] PT Time Calculation (min) (ACUTE ONLY): 24 min  Charges:  $Therapeutic Exercise: 8-22 mins $Therapeutic Activity: 8-22 mins                    G Codes:      Ramond Dial 07/08/15, 6:45 PM   Mee Hives, PT MS Acute Rehab Dept. Number: [facility]  [id number] and [place] [phone]

## 2015-06-13 NOTE — Interval H&P Note (Deleted)
Kerry [name] was admitted today to Inpatient Rehabilitation with the diagnosis of ICH and PE/DVT.  The patient's history has been reviewed, patient examined, and there is no change in status.  Patient continues to be appropriate for intensive inpatient rehabilitation.  I have reviewed the patient's chart and labs.  Questions were answered to the patient's satisfaction. The PAPE has been reviewed and assessment remains appropriate.  [name] 06/13/2015, 9:46 PM

## 2015-06-13 NOTE — Progress Notes (Signed)
Speech Language Pathology Treatment: Dysphagia  Patient Details Name: Kerry [name] MRN: [id number] DOB: [birth year] Today's Date: 06/13/2015 Time: [id number] SLP Time Calculation (min) (ACUTE ONLY): 19 min  Assessment / Plan / Recommendation Clinical Impression  F/u diet tolerance assessment complete with focus on diagnostic po trials to determine potential to advance diet. Spouse present and supportive. Overall, swallowing function appearing consistent with that noted during [facility] 12/14. Clinician provided patient with trials of thin liquids via cup sip in which patient was able to consume with min verbal cues for small single sips and no overt s/s of aspiration. Note that aspiration on previous MBS with thin liquids silent in nature. Function with pureed and dysphagia 2 solids also appearing WFL during today's treatment with intact oral transit and no overt s/s of aspiration. Recommend repeat MBS to determine ability to advance diet based on silent nature of aspiration. Should patient be d/c'd to CIR, this can be complete in next 1-2 days on CIR. If patient d/c's home, recommend HH or OP SLP f/u with repeat MBS to be completed as OP next week prior to any diet adjustments made.    HPI HPI: pt presents with dense L MCA Infarct involving L Temporal, Frontoparietal, and L Basal Ganglia regions and was started on TPA, but unable to complete full dose due to hypertension, but did recieve IR intervention. Pt extubated 10/30. pt with hx of DM, HTN, Breast CA s/p Mastectomy, and Fibromyalgia. Patient transferred to [facility] and then Inpatient Rehab. Transferred back to acute on 06/07/15 due to PE.      SLP Plan  MBS     Recommendations  Diet recommendations: Dysphagia 2 (fine chop);Nectar-thick liquid Liquids provided via: Cup;No straw Medication Administration: Crushed with puree Supervision: Patient able to self feed;Full supervision/cueing for compensatory  strategies Compensations: Slow rate;Small sips/bites;Minimize environmental distractions;Multiple dry swallows after each bite/sip;Follow solids with liquid Postural Changes and/or Swallow Maneuvers: Seated upright 90 degrees              Oral Care Recommendations: Oral care BID Follow up Recommendations: Inpatient Rehab Plan: [place], CCC-SLP [phone]  Charmika Macdonnell Meryl 06/13/2015, 10:45 AM

## 2015-06-13 NOTE — Interval H&P Note (Deleted)
Kerry [name] was admitted today to Inpatient Rehabilitation with the diagnosis of left MCA infarct and PE.  The patient's history has been reviewed, patient examined, and there is no change in status.  Patient continues to be appropriate for intensive inpatient rehabilitation.  I have reviewed the patient's chart and labs.  Questions were answered to the patient's satisfaction. The PAPE has been reviewed and assessment remains appropriate.  Elayah Klooster [name] 06/13/2015, 10:19 PM

## 2015-06-13 NOTE — Progress Notes (Signed)
Report called and given to RN on [place] to be discharged from [facility] and admitted to Inpatient rehab on 4w13.

## 2015-06-14 ENCOUNTER — Inpatient Hospital Stay (HOSPITAL_COMMUNITY): Payer: 59 | Admitting: Occupational Therapy

## 2015-06-14 ENCOUNTER — Inpatient Hospital Stay (HOSPITAL_COMMUNITY): Payer: 59 | Admitting: Speech Pathology

## 2015-06-14 ENCOUNTER — Inpatient Hospital Stay (HOSPITAL_COMMUNITY): Payer: 59 | Admitting: Physical Therapy

## 2015-06-14 DIAGNOSIS — D649 Anemia, unspecified: Secondary | ICD-10-CM

## 2015-06-14 DIAGNOSIS — R4701 Aphasia: Secondary | ICD-10-CM

## 2015-06-14 DIAGNOSIS — E46 Unspecified protein-calorie malnutrition: Secondary | ICD-10-CM

## 2015-06-14 LAB — COMPREHENSIVE METABOLIC PANEL
ALT: 24 U/L (ref 14–54)
ANION GAP: 10 (ref 5–15)
AST: 20 U/L (ref 15–41)
Albumin: 2.9 g/dL — ABNORMAL LOW (ref 3.5–5.0)
Alkaline Phosphatase: 52 U/L (ref 38–126)
BUN: 7 mg/dL (ref 6–20)
CHLORIDE: 98 mmol/L — AB (ref 101–111)
CO2: 30 mmol/L (ref 22–32)
CREATININE: 0.32 mg/dL — AB (ref 0.44–1.00)
Calcium: 9.1 mg/dL (ref 8.9–10.3)
Glucose, Bld: 191 mg/dL — ABNORMAL HIGH (ref 65–99)
POTASSIUM: 3.8 mmol/L (ref 3.5–5.1)
SODIUM: 138 mmol/L (ref 135–145)
Total Bilirubin: 0.4 mg/dL (ref 0.3–1.2)
Total Protein: 6.2 g/dL — ABNORMAL LOW (ref 6.5–8.1)

## 2015-06-14 LAB — CBC WITH DIFFERENTIAL/PLATELET
Basophils Absolute: 0 10*3/uL (ref 0.0–0.1)
Basophils Relative: 0 %
EOS ABS: 0.1 10*3/uL (ref 0.0–0.7)
EOS PCT: 2 %
HCT: 36.3 % (ref 36.0–46.0)
Hemoglobin: 11.2 g/dL — ABNORMAL LOW (ref 12.0–15.0)
LYMPHS ABS: 2.3 10*3/uL (ref 0.7–4.0)
LYMPHS PCT: 34 %
MCH: 27.9 pg (ref 26.0–34.0)
MCHC: 30.9 g/dL (ref 30.0–36.0)
MCV: 90.3 fL (ref 78.0–100.0)
MONO ABS: 0.4 10*3/uL (ref 0.1–1.0)
Monocytes Relative: 6 %
Neutro Abs: 4 10*3/uL (ref 1.7–7.7)
Neutrophils Relative %: 58 %
PLATELETS: 195 10*3/uL (ref 150–400)
RBC: 4.02 MIL/uL (ref 3.87–5.11)
RDW: 14.7 % (ref 11.5–15.5)
WBC: 6.9 10*3/uL (ref 4.0–10.5)

## 2015-06-14 LAB — GLUCOSE, CAPILLARY
GLUCOSE-CAPILLARY: 214 mg/dL — AB (ref 65–99)
GLUCOSE-CAPILLARY: 178 mg/dL — AB (ref 65–99)
GLUCOSE-CAPILLARY: 236 mg/dL — AB (ref 65–99)
GLUCOSE-CAPILLARY: 263 mg/dL — AB (ref 65–99)
Glucose-Capillary: 237 mg/dL — ABNORMAL HIGH (ref 65–99)
Glucose-Capillary: 231 mg/dL — ABNORMAL HIGH (ref 65–99)

## 2015-06-14 MED ORDER — RIVAROXABAN 20 MG PO TABS: 20.0000 mg | ORAL_TABLET | Freq: Every day | ORAL | Status: DC

## 2015-06-14 MED ORDER — BENEPROTEIN PO POWD
1.0000 | Freq: Three times a day (TID) | ORAL | Status: DC
  Administered 2015-06-14 – 2015-06-18 (×11): 6 g via ORAL
  Filled 2015-06-14: qty 227

## 2015-06-14 MED ORDER — POTASSIUM CHLORIDE CRYS ER 20 MEQ PO TBCR
30.0000 meq | EXTENDED_RELEASE_TABLET | Freq: Every day | ORAL | Status: DC
  Administered 2015-06-14 – 2015-06-16 (×3): 30 meq via ORAL
  Filled 2015-06-14 (×3): qty 1

## 2015-06-14 MED ORDER — ALPRAZOLAM 0.5 MG PO TABS
0.5000 mg | ORAL_TABLET | Freq: Three times a day (TID) | ORAL | Status: DC | PRN
  Administered 2015-06-14 – 2015-06-18 (×8): 0.5 mg via ORAL
  Filled 2015-06-14 (×8): qty 1

## 2015-06-14 MED ORDER — CETYLPYRIDINIUM CHLORIDE 0.05 % MT LIQD
7.0000 mL | Freq: Two times a day (BID) | OROMUCOSAL | Status: DC
  Administered 2015-06-14 – 2015-06-18 (×7): 7 mL via OROMUCOSAL

## 2015-06-14 MED ORDER — RIVAROXABAN 15 MG PO TABS
15.0000 mg | ORAL_TABLET | Freq: Two times a day (BID) | ORAL | Status: DC
  Administered 2015-06-14 – 2015-06-18 (×9): 15 mg via ORAL
  Filled 2015-06-14 (×9): qty 1

## 2015-06-14 NOTE — Discharge Instructions (Addendum)
Information on my medicine - XARELTO (rivaroxaban)  This medication education was reviewed with me or my healthcare representative as part of my discharge preparation.    WHY WAS XARELTO PRESCRIBED FOR YOU? 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  What do you need to know about Xarelto? The starting dose is one 15 mg tablet taken TWICE daily with food for the FIRST 21 DAYS then on (enter date)  07/01/2015  the dose is changed to one 20 mg tablet taken ONCE A DAY with your evening meal.  DO NOT stop taking Xarelto without talking to the health care provider who prescribed the medication.  Refill your prescription for 20 mg tablets before you run out.  After discharge, you should have regular check-up appointments with your healthcare provider that is prescribing your Xarelto.  In the future your dose may need to be changed if your kidney function changes by a significant amount.  What do you do if you miss a dose? If you are taking Xarelto TWICE DAILY and you miss a dose, take it as soon as you remember. You may take two 15 mg tablets (total 30 mg) at the same time then resume your regularly scheduled 15 mg twice daily the next day.  If you are taking Xarelto ONCE DAILY and you miss a dose, take it as soon as you remember on the same day then continue your regularly scheduled once daily regimen the next day. Do not take two doses of Xarelto at the same time.   Important Safety Information Xarelto is a blood thinner medicine that can cause bleeding. You should call your healthcare provider right away if you experience any of the following: ? Bleeding from an injury or your nose that does not stop. ? Unusual colored urine (red or dark brown) or unusual colored stools (red or black). ? Unusual bruising for unknown reasons. ? A  serious fall or if you hit your head (even if there is no bleeding).  Some medicines may interact with Xarelto and might increase your risk of bleeding while on Xarelto. To help avoid this, consult your healthcare provider or pharmacist prior to using any new prescription or non-prescription medications, including herbals, vitamins, non-steroidal anti-inflammatory drugs (NSAIDs) and supplements.  This website has more information on Xarelto: [email or web address]

## 2015-06-14 NOTE — Evaluation (Signed)
Speech Language Pathology Assessment and Plan  Patient Details  Name: Kerry [name] MRN: [id number] Date of Birth: Jun 20, 1956  SLP Diagnosis: Aphasia;Apraxia;Cognitive Impairments;Dysphagia  Rehab Potential: Good ELOS: 5-7 days     Today's Date: 06/14/2015 SLP Individual Time: 1005-1100 SLP Individual Time Calculation (min): 55 min   Problem List:  Patient Active Problem List   Diagnosis Date Noted  . HTN (hypertension) 06/13/2015  . Mental status change   . PE (pulmonary embolism) 06/07/2015  . Elevated troponin 06/07/2015  . UTI (urinary tract infection) 06/07/2015  . Coffee ground emesis 06/07/2015  . Sepsis ([place]) 06/07/2015  . Pulmonary embolism without acute cor pulmonale ([place])   . Arterial hypotension   . Hypoxemia   . Stridor   . Sinus tachycardia ([place]) 06/06/2015  . Vomiting blood   . Stroke due to thrombosis of left middle cerebral artery ([place]) 05/16/2015  . Hemiparesis, aphasia, and dysphagia as late effect of cerebrovascular accident (CVA) ([place])   . Essential hypertension   . Coronary artery disease involving native coronary artery of native heart with angina pectoris ([place])   . History of breast cancer   . Fibromyalgia   . Chronic pain syndrome   . Generalized anxiety disorder   . Prerenal azotemia   . Pneumonia   . Chronic respiratory failure ([place])   . Respiratory failure ([place])   . Hypokalemia   . Compromised airway   . HLD (hyperlipidemia)   . Type 2 diabetes mellitus with circulatory disorder ([place])   . Sinus bradycardia   . Asystole ([place])   . Acute respiratory failure ([place])   . Cerebral hemorrhage ([place])   . Cytotoxic cerebral edema ([place])   . Sinus pause   . Cerebrovascular accident (CVA) due to occlusion of left middle cerebral artery ([place])   . Cerebrovascular accident (CVA) due to thrombosis of precerebral artery ([place])   . Encounter for central line placement   . Encounter for feeding tube placement   . CVA (cerebral infarction) 04/11/2015   . Stroke (cerebrum) ([place]) 04/11/2015  . Aphasia   . 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 Hypertensive urgency   . Uncontrolled type 2 diabetes mellitus with hyperosmolarity without coma, without long-term current use of insulin ([place])   . Breast cancer ([place]) 11/08/2012   Past Medical History:  Past Medical History  Diagnosis Date  . Hypertension   . Coronary artery disease   . Diabetes mellitus   . Breast cancer ([place]) 03/10/2007    Left breat  . Degenerative disc disease, cervical   . Degenerative disc disease, lumbar   . Fibromyalgia   . Hyperlipidemia   . Carpal tunnel syndrome on right   . PE (pulmonary embolism)   . UTI (lower urinary tract infection)   . GIB (gastrointestinal bleeding)   . Elevated troponin   . HTN (hypertension) 06/13/2015   Past Surgical History:  Past Surgical History  Procedure Laterality Date  . Cardiac surgery    . Appendectomy    . Breast surgery    . Mastectomy Left 03/2007  . Cardiac catheterization  2005  . Vein surgery Right 2005  . Tubal ligation Right   . Radiology with anesthesia N/A 04/11/2015    Procedure: RADIOLOGY WITH ANESTHESIA;  Surgeon: Kerry Bras, MD;  Location: [facility];  Service: Radiology;  Laterality: N/A;  . Cardiac catheterization N/A 04/13/2015    Procedure: Temporary Pacemaker;  Surgeon: Kerry M Martinique, MD;  Location: [facility];  Service: Cardiovascular;  Laterality: N/A;  . Peg placement N/A 04/18/2015    Procedure: PERCUTANEOUS ENDOSCOPIC GASTROSTOMY (PEG) PLACEMENT;  Surgeon: Kerry Horn, MD;  Location: [facility];  Service: General;  Laterality: N/A;  bedside  . Esophagogastroduodenoscopy N/A 04/18/2015    Procedure: ESOPHAGOGASTRODUODENOSCOPY (EGD);  Surgeon: Kerry Horn, MD;  Location: [facility] ENDOSCOPY;  Service: General;  Laterality: N/A;    Assessment / Plan / Recommendation Clinical Impression   Kerry [name] is a 59 y.o. female who was admitted on 04/11/15 with difficulty speaking,  right facial droop and right sided weakness due to L-MCA infarct with occlusion of L-ICA with hemorrhagic conversion. She was treated with embolectomy and stent with TICI revascularization but developed dense right hemiparesis with global aphasia, apraxia and dysphagia. She had complicated hospital course with VDRF requiring trach and PEG. She was admitted to CIR on 05/16/15. She continued to have dysphagia with poor po intake, bouts of nausea and vomiting as well as nose bleeds. She was made NPO on 12/12 due to black emesis and concerns of GIB. Work up negative and diet resumed. She developed tachycardia with hypotension on 12/14 due to dehydration, RLL PE and was transferred to step down unit. Laryngoscopy by Dr. 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 Could consider follow up with ENT at one of [facility] once she improves from recent issues. Family education was almost completed during her rehab stay but she has declined due to immobility on acute due to medical issues. She is admitted back to CIR to complete family education.  Pt presents with no significant functional changes from previous CIR admission.   Pt continues to present with moderately severe global aphasia characterized by difficulty following multi-step commands, answering semi-complex factual yes/no question, and naming familiar objects.  Verbal output is almost entirely characterized by perseverative, neologistic jargon.  Pt's expressive deficits remain complicated by verbal and oral apraxia as well.   Pt also continues to present with s/s of a mild-moderate oropharyngeal dysphagia.  Multiple swallows were noted with consumption of solids and liquids.  Trials of thin liquids resulted in delayed cough in 1 out of 5 trials.  No overt s/s of aspiration were evident with consumption of nectar thick liquids.  Will plan to repeat  MBS at next available appointment.  Pt would benefit from resuming CIR therapies in order to complete family education prior to discharge.  Anticipate that pt will continue to need close 24/7 supervision and ST follow up at next level of care.     Skilled Therapeutic Interventions          Cognitive-linguistic and bedside swallow evaluation completed with results and recommendations reviewed with patient and family.     SLP Assessment  Patient will need skilled Speech Lanaguage Pathology Services during CIR admission    Recommendations  SLP Diet Recommendations: Dysphagia 2 (Fine chop);Nectar Liquid Administration via: Cup Medication Administration: Crushed with puree Supervision: Patient able to self feed;Full supervision/cueing for compensatory strategies Compensations: Slow rate;Small sips/bites;Minimize environmental distractions;Multiple dry swallows after each bite/sip;Follow solids with liquid Postural Changes and/or Swallow Maneuvers: Seated upright 90 degrees Oral Care Recommendations: Oral care BID Patient destination: Home Follow up Recommendations: 24 hour supervision/assistance;Home Health SLP;Outpatient SLP Equipment Recommended: To be determined    SLP Frequency 3 to 5 out of 7 days   SLP Treatment/Interventions Cognitive remediation/compensation;Cueing hierarchy;Dysphagia/aspiration precaution training;Environmental controls;Functional tasks;Internal/external aids;Patient/family education;Multimodal communication approach;Speech/Language facilitation;Therapeutic Activities   Pain  Pain Assessment Pain Assessment: Faces Faces Pain Scale: Hurts even more Pain Type: Acute pain Pain Location: Generalized Pain Descriptors / Indicators: Aching;Grimacing;Discomfort Pain Intervention(s): Repositioned Prior Functioning Cognitive/Linguistic Baseline: Within functional limits Type of Home: House  Lives With: Spouse;Son;Other (Comment) (son's fiancee, Amy) Available Help at  Discharge: Family;Available 24 hours/day  Function:  Eating Eating   Modified Consistency Diet: Yes Eating Assist Level: Supervision or verbal cues;Set up assist for   Eating Set Up Assist For: Opening containers       Cognition Comprehension Comprehension assist level: Understands basic 50 - 74% of the time/ requires cueing 25 - 49% of the time  Expression   Expression assist level: Expresses basic 25 - 49% of the time/requires cueing 50 - 75% of the time. Uses single words/gestures.  Social Interaction Social Interaction assist level: Interacts appropriately 50 - 74% of the time - May be physically or verbally inappropriate.  Problem Solving Problem solving assist level: Solves basic 50 - 74% of the time/requires cueing 25 - 49% of the time  Memory Memory assist level: Recognizes or recalls 25 - 49% of the time/requires cueing 50 - 75% of the time   Short Term Goals: Week 1: SLP Short Term Goal 1 (Week 1): STG=LTG due to ELOS  Refer to Care Plan for Long Term Goals  Recommendations for other services: None  Discharge Criteria: Patient will be discharged from SLP if patient refuses treatment 3 consecutive times without medical reason, if treatment goals not met, if there is a change in medical status, if patient makes no progress towards goals or if patient is discharged from hospital.  The above assessment, treatment plan, treatment alternatives and goals were discussed and mutually agreed upon: by patient and by family  Emilio Math 06/14/2015, 12:50 PM

## 2015-06-14 NOTE — Evaluation (Signed)
Physical Therapy Assessment and Plan  Patient Details  Name: Kerry [name] MRN: [id number] Date of Birth: 24-Sep-1955  PT Diagnosis: Abnormal posture, Hemiplegia dominant, Hypotonia, Impaired cognition and Muscle weakness Rehab Potential: Fair ELOS: 5-7 days   Today's Date: 06/14/2015 PT Individual Time: 1430-1530 PT Individual Time Calculation (min): 60 min    Problem List:  Patient Active Problem List   Diagnosis Date Noted  . HTN (hypertension) 06/13/2015  . Mental status change   . PE (pulmonary embolism) 06/07/2015  . Elevated troponin 06/07/2015  . UTI (urinary tract infection) 06/07/2015  . Coffee ground emesis 06/07/2015  . Sepsis ([place]) 06/07/2015  . Pulmonary embolism without acute cor pulmonale ([place])   . Arterial hypotension   . Hypoxemia   . Stridor   . Sinus tachycardia ([place]) 06/06/2015  . Vomiting blood   . Stroke due to thrombosis of left middle cerebral artery ([place]) 05/16/2015  . Hemiparesis, aphasia, and dysphagia as late effect of cerebrovascular accident (CVA) ([place])   . Essential hypertension   . Coronary artery disease involving native coronary artery of native heart with angina pectoris ([place])   . History of breast cancer   . Fibromyalgia   . Chronic pain syndrome   . Generalized anxiety disorder   . Prerenal azotemia   . Pneumonia   . Chronic respiratory failure ([place])   . Respiratory failure ([place])   . Hypokalemia   . Compromised airway   . HLD (hyperlipidemia)   . Type 2 diabetes mellitus with circulatory disorder ([place])   . Sinus bradycardia   . Asystole ([place])   . Acute respiratory failure ([place])   . Cerebral hemorrhage ([place])   . Cytotoxic cerebral edema ([place])   . Sinus pause   . Cerebrovascular accident (CVA) due to occlusion of left middle cerebral artery ([place])   . Cerebrovascular accident (CVA) due to thrombosis of precerebral artery ([place])   . Encounter for central line placement   . Encounter for feeding tube placement   . CVA  (cerebral infarction) 04/11/2015  . Stroke (cerebrum) ([place]) 04/11/2015  . Aphasia   . 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 Hypertensive urgency   . Uncontrolled type 2 diabetes mellitus with hyperosmolarity without coma, without long-term current use of insulin ([place])   . Breast cancer ([place]) 11/08/2012    Past Medical History:  Past Medical History  Diagnosis Date  . Hypertension   . Coronary artery disease   . Diabetes mellitus   . Breast cancer ([place]) 03/10/2007    Left breat  . Degenerative disc disease, cervical   . Degenerative disc disease, lumbar   . Fibromyalgia   . Hyperlipidemia   . Carpal tunnel syndrome on right   . PE (pulmonary embolism)   . UTI (lower urinary tract infection)   . GIB (gastrointestinal bleeding)   . Elevated troponin   . HTN (hypertension) 06/13/2015   Past Surgical History:  Past Surgical History  Procedure Laterality Date  . Cardiac surgery    . Appendectomy    . Breast surgery    . Mastectomy Left 03/2007  . Cardiac catheterization  2005  . Vein surgery Right 2005  . Tubal ligation Right   . Radiology with anesthesia N/A 04/11/2015    Procedure: RADIOLOGY WITH ANESTHESIA;  Surgeon: Luanne Bras, MD;  Location: [facility];  Service: Radiology;  Laterality: N/A;  . Cardiac catheterization N/A 04/13/2015    Procedure: Temporary Pacemaker;  Surgeon: Peter M Martinique, MD;  Location: [facility]  CV LAB;  Service: Cardiovascular;  Laterality: N/A;  . Peg placement N/A 04/18/2015    Procedure: PERCUTANEOUS ENDOSCOPIC GASTROSTOMY (PEG) PLACEMENT;  Surgeon: Judeth Horn, MD;  Location: [facility];  Service: General;  Laterality: N/A;  bedside  . Esophagogastroduodenoscopy N/A 04/18/2015    Procedure: ESOPHAGOGASTRODUODENOSCOPY (EGD);  Surgeon: Judeth Horn, MD;  Location: [facility] ENDOSCOPY;  Service: General;  Laterality: N/A;    Assessment & Plan Clinical Impression:  59 y.o. female with PMH of hypertension, hyperlipidemia, diabetes mellitus,  GERD, CAD, breast cancer (s/P left mastectomy), DDD, carpal tunnel syndrome, recent large left MCA territory infarct with resolving associated hemorrhage after tPA treatment, s/p of PEG placement, right-sided hemiparalysis, began inpt acute rehab 11/23. Where she was admitted from [facility].  On 06/07/15 pt developed tachycardia and hypotension. Cardiology was consulted, Dr. [name] suspected that patient may have PE, which was confirmed by CTA. PCCM was consulted and recommended to start IV heparin. [facility] admitted pt to acute hospital. Pt had aphasia, but could tell that she was shortness of breath and had mild chest pain. She was being treated for UTI with ancef since 12/13. CTA showed subsegmental right lower lobe pulmonary emboli. No evidence of right heart strain. Possible filling defect in the left atrial appendage.  Patient was found to have hypotension with blood pressure 82/46, potassium 3.1, troponin 0.33, lactate 1.1, tachycardia, tachypnea, temperature 99.8, WBC 15.4. Patient was admitted to stepdown bed for further eval and treatment.  Patient admitted from CIR for PE/DVT, was started on heparin and transition to [place]. ENT did see patient 06/09/15 and recommended outpatient follow-up for bilateral vocal fold paresis/hypomobility. Felt to be a sequela of her recent CVA. Since she does not appear to be in any respiratory distress from this and is able to lie flat with no difficulty, and she passed her swallow eval, can likely just observe with oxygen as needed.  Patient transferred to CIR on 06/13/2015 .   Patient currently requires max with mobility secondary to muscle weakness, decreased cardiorespiratoy endurance and decreased oxygen support, abnormal tone, motor apraxia and decreased coordination, decreased initiation, decreased awareness, decreased problem solving, decreased safety awareness, decreased memory and delayed processing and decreased sitting balance, decreased  standing balance, decreased postural control, hemiplegia and decreased balance strategies.  Prior to hospitalization, patient was independent  with mobility and lived with Spouse, Son, Other (Comment) (son's fiance) in a House home.  Home access is 3Stairs to enter.  Patient will benefit from skilled PT intervention to maximize safe functional mobility, minimize fall risk and decrease caregiver burden for planned discharge home with 24 hour assist.  Anticipate patient will benefit from follow up [facility] at discharge.  PT - End of Session Activity Tolerance: Tolerates 30+ min activity with multiple rests Endurance Deficit: Yes Endurance Deficit Description: several rest breaks throughout, vitals unstable  PT Assessment Rehab Potential (ACUTE/IP ONLY): Fair Barriers to Discharge: Inaccessible home environment PT Patient demonstrates impairments in the following area(s): Balance;Perception;Safety;Endurance;Sensory;Motor;Skin Integrity PT Transfers Functional Problem(s): Bed Mobility;Bed to Chair;Car PT Locomotion Functional Problem(s): Ambulation;Wheelchair Mobility PT Plan PT Intensity: Minimum of 1-2 x/day ,45 to 90 minutes PT Frequency: 5 out of 7 days PT Duration Estimated Length of Stay: 5-7 days PT Treatment/Interventions: Balance/vestibular training;Community reintegration;Cognitive remediation/compensation;Discharge planning;Disease management/prevention;DME/adaptive equipment instruction;Functional mobility training;Neuromuscular re-education;Patient/family education;Psychosocial support;Stair training;Therapeutic Exercise;Therapeutic Activities;UE/LE Strength taining/ROM;UE/LE Coordination activities;Wheelchair propulsion/positioning;Ambulation/gait training PT Transfers Anticipated Outcome(s): modA squat pivot PT Locomotion Anticipated Outcome(s): minA w/c propulsion PT Recommendation Follow Up Recommendations: Home health PT Patient destination: Home Equipment  Details: equipment  reportedly already delivered to home before d/c to acute  Skilled Therapeutic Intervention Pt received supine in bed, no evidence of pain via FACES scale, and pt agreeable to treatment repeating "I want a bath" several times and reaching for therapist. Clarified that pt meant bath using body signals and gestures, and pt states yes. Vitals assessed while preparing for bathing. Elevated BP and HR 187/96 HR 114, O2 89%. Nurse Ed alerted who spoke with PA regarding vitals and therapist informed of decision not to alter BP meds at this time. Pt put on 2L O2 via [place] with O2 increased to 96% within 1-2 minutes. Vitals monitored throughout session. Sitting on EOB, pt performed upper body bathing and dressing with modA, with therapist washing back, threading RUE and pt performing all other upper body tasks with min guard>minA for sitting balance. BP continues to be elevated, and pt returned to supine with minA. HOB elevated, and son present at this time attempting to calm pt down with cues for deep breathing. Son concerned about elevated vitals and expresses frustration with previous stay when pt developed PE. Rolling in bed with bedrails and minA to A with lower body bathing performed with totalA. BP elevated to 209/116 and HR 119 after rolling in bed. Nurse alerted again to vitals. Pt declines lower body dressing and all other mobility tasks. Pt remained supine in bed with all needs in reach and son present at completion of session. Alerted PA Pam regarding elevated vitals and family concerns. Evaluation limited due to pt requesting bath, aphasia, elevated vitals, and pt declining most mobility tasks. Will continue to assess as pt tolerates.   PT Evaluation Precautions/Restrictions Precautions Precautions: Fall Precaution Comments: Peg, Rt shoulder subluxation Required Braces or Orthoses: Other Brace/Splint (R foot PRAFO) Restrictions Weight Bearing Restrictions: No General Chart Reviewed: Yes Response to  Previous Treatment: Patient unable to report, no changes reported from family or staff Family/Caregiver Present: Yes (son [name])   Therapy Vitals Pulse Rate: (!) 117 BP: (!) 208/113 mmHg Patient Position (if appropriate): Lying Oxygen Therapy SpO2: 93 % O2 Device: Nasal Cannula O2 Flow Rate (L/min): 2 L/min Pain Pain Assessment Pain Assessment: Faces Faces Pain Scale: No hurt Pain Type: Acute pain Pain Location: Generalized Pain Descriptors / Indicators: Aching;Grimacing;Discomfort Pain Intervention(s): Repositioned Home Living/Prior Functioning Home Living Available Help at Discharge: Family;Available 24 hours/day Type of Home: House Home Access: Stairs to enter [employer] of Steps: 3 Entrance Stairs-Rails: Right;Left;Can reach both Home Layout: Two level;Able to live on main level with bedroom/bathroom Bathroom Shower/Tub: Tub/shower unit;Curtain Bathroom Toilet: Standard Bathroom Accessibility: Yes Additional Comments: Info from family, in agreement with admission documentation  Lives With: Spouse;Son;Other (Comment) (son's fiance) Prior Function Level of Independence: Independent with basic ADLs;Independent with homemaking with ambulation;Independent with gait;Independent with transfers (Level of function prior to original CVA with complicated hospital course)  Able to Take Stairs?: Yes Driving: Yes 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 Comments: Previously on CIR, preparing for d/c and family in process of family educated  Cognition Overall Cognitive Status: Impaired/Different from baseline Arousal/Alertness: Awake/alert Orientation Level: Oriented to person Attention: Sustained;Selective Sustained Attention: Appears intact Selective Attention: Impaired Selective Attention Impairment: Functional basic;Verbal basic Memory:  (difficult to assess d/t aphasia; pt remembered therapist from previous CIR stay) Awareness: Impaired Awareness Impairment:  Emergent impairment Problem Solving: Impaired Problem Solving Impairment: Verbal basic;Functional basic Behaviors: Perseveration Safety/Judgment: Impaired Sensation Sensation Light Touch: Not tested (Not formally tested d/t aphasia, inconsistent yes/no) Stereognosis: Not tested Hot/Cold: Not  tested Proprioception: Not tested (NT due to aphasia, inconsistent yes/no) Coordination Gross Motor Movements are Fluid and Coordinated: No Fine Motor Movements are Fluid and Coordinated: No Heel Shin Test: unable to assess due to impaired attention, intitation, RLE weakness, apraxia Motor  Motor Motor: Hemiplegia;Abnormal postural alignment and control;Motor impersistence;Motor perseverations;Motor apraxia Motor - Skilled Clinical Observations: R lateral trunk lean, R extremities flaccid  Mobility Bed Mobility Bed Mobility: Rolling Right;Rolling Left;Supine to Sit;Sit to Supine Rolling Right: 4: Min assist;With rail Rolling Right Details: Tactile cues for placement;Verbal cues for precautions/safety;Verbal cues for sequencing;Verbal cues for technique Rolling Left: 4: Min assist;With rail Rolling Left Details: Tactile cues for weight beaing;Tactile cues for placement;Verbal cues for technique;Verbal cues for precautions/safety;Verbal cues for sequencing Supine to Sit: 3: Mod assist Supine to Sit Details: Verbal cues for sequencing;Verbal cues for technique;Tactile cues for weight beaing;Tactile cues for placement;Manual facilitation for placement Sit to Supine: 3: Mod assist Sit to Supine - Details: Verbal cues for sequencing;Verbal cues for technique;Verbal cues for precautions/safety;Manual facilitation for placement;Manual facilitation for weight shifting Transfers Transfers: No (pt declined, elevated vitals) Locomotion  Ambulation Ambulation: No (NT due to safety concerns, unstable vitals) Stairs / Additional Locomotion Stairs: No (NT due to safety concerns, unstable vitals) Wheelchair  Mobility Wheelchair Mobility: No (NT due to unstable vitals, pt did not get OOB)  Trunk/Postural Assessment  Cervical Assessment Cervical Assessment: Exceptions to [facility] (forward head posture, cervical flexion ) Thoracic Assessment Thoracic Assessment: Exceptions to [facility] (severely increased thoracic kyphosis, rounded shoulders) Lumbar Assessment Lumbar Assessment: Exceptions to [facility] (posterior pelvic tilt, sacral sitting) Postural Control Postural Control: Deficits on evaluation (impaired righting reactions in sitting)  Balance Balance Balance Assessed: Yes Static Sitting Balance Static Sitting - Balance Support: Left upper extremity supported;Feet supported Static Sitting - Level of Assistance: 5: Stand by assistance Dynamic Sitting Balance Dynamic Sitting - Balance Support: No upper extremity supported;Feet supported Dynamic Sitting - Level of Assistance: 4: Min assist Dynamic Sitting Balance - Compensations: able to recover with LUE when LOB with minA Sitting balance - Comments: Initially maxA sitting balance, improved to close S with LUE support on bedrail. With bathing using LUE to wash chest, several LOB to L side requiring use of LUE on bed or rail and min A to recover Static Standing Balance Static Standing - Balance Support:  (NT) Extremity Assessment  RUE Assessment RUE Assessment: Exceptions to [facility] RUE AROM (degrees) RUE Overall AROM Comments: no noted AROM, per family report pt does demonstrate occasional volitional movement however unsure if due to tone, apraxia  RUE Strength RUE Overall Strength Comments: defer to OT exam LUE Assessment LUE Assessment: Within Functional Limits RLE Assessment RLE Assessment: Exceptions to [facility] (no AROM, PROM WFL with exception of limited ankle dorsiflexion) LLE Assessment LLE Assessment: Within Functional Limits   See Function Navigator for Current Functional Status.   Refer to Care Plan for Long Term Goals  Recommendations for  other services: None  Discharge Criteria: Patient will be discharged from PT if patient refuses treatment 3 consecutive times without medical reason, if treatment goals not met, if there is a change in medical status, if patient makes no progress towards goals or if patient is discharged from hospital.  The above assessment, treatment plan, treatment alternatives and goals were discussed and mutually agreed upon: by patient and by family  Luberta Mutter 06/14/2015, 4:23 PM

## 2015-06-14 NOTE — Progress Notes (Signed)
Patient information reviewed and entered into eRehab system by Kengo Sturges, RN, CRRN, PPS Coordinator.  Information including medical coding and functional independence measure will be reviewed and updated through discharge.    

## 2015-06-14 NOTE — Care Management Note (Signed)
[facility] Individual Statement of Services  Patient Name:  Kerry [name]  Date:  06/14/2015  Welcome to the [facility].  Our goal is to provide you with an individualized program based on your diagnosis and situation, designed to meet your specific needs.  With this comprehensive rehabilitation program, you will be expected to participate in at least 3 hours of rehabilitation therapies Monday-Friday, with modified therapy programming on the weekends.  Your rehabilitation program will include the following services:  Physical Therapy (PT), Occupational Therapy (OT), Speech Therapy (ST), 24 hour per day rehabilitation nursing, Therapeutic Recreaction (TR), Neuropsychology, Case Management (Social Worker), Rehabilitation Medicine, Nutrition Services and Pharmacy Services  Weekly team conferences will be held on Wednesday to discuss your progress.  Your Social Worker will talk with you frequently to get your input and to update you on team discussions.  Team conferences with you and your family in attendance may also be held.  Expected length of stay: 5-7 days  Overall anticipated outcome:min/mod level of assist  Depending on your progress and recovery, your program may change. Your Social Worker will coordinate services and will keep you informed of any changes. Your Social Worker's name and contact numbers are listed  below.  The following services may also be recommended but are not provided by the [facility] will be made to provide these services after discharge if needed.  Arrangements include referral to agencies that provide these services.  Your insurance has been verified to be:  [employer] Your primary doctor is:  Lucia Gaskins  Pertinent information will be shared with your doctor and your  insurance company.  Social Worker:  Ovidio Kin, [place] or ([phone]  Information discussed with and copy given to patient by: Elease Hashimoto, 06/14/2015, 2:27 PM

## 2015-06-14 NOTE — IPOC Note (Signed)
Overall Plan of Care [facility]) Patient Details Name: Kerry [name] MRN: [id number] DOB: 08-Jul-1955  Admitting Diagnosis: short [place] [facility] Problems: Principal Problem:   Stroke (cerebrum) [facility]) Active Problems:   Aphasia   Flaccid hemiplegia and hemiparesis ([place])   Uncontrolled type 2 diabetes mellitus with hyperosmolarity without coma, without long-term current use of insulin (HCC)   HLD (hyperlipidemia)   Type 2 diabetes mellitus with circulatory disorder (HCC)   Hemiparesis, aphasia, and dysphagia as late effect of cerebrovascular accident (CVA) ([place])   Fibromyalgia   Chronic pain syndrome   Generalized anxiety disorder   Pulmonary embolism without acute cor pulmonale (HCC)   HTN (hypertension)   Tachycardia     Functional Problem List: Nursing Bladder, Bowel, Endurance, Medication Management, Motor, Nutrition, Perception, Safety, Sensory, Skin Integrity  PT Balance, Perception, Safety, Endurance, Sensory, Motor, Skin Integrity  OT Balance, Pain, Endurance, Perception, Safety, Sensory, Cognition  SLP Cognition, Linguistic, Motor, Nutrition, Safety  TR         Basic ADL's: OT Eating, Grooming, Bathing, Dressing, Toileting     Advanced  ADL's: OT       Transfers: PT Bed Mobility, Bed to Chair, Teacher, early years/pre, Tub/Shower     Locomotion: PT Ambulation, Wheelchair Mobility     Additional Impairments: OT Fuctional Use of Upper Extremity  SLP Swallowing, Communication, Social Cognition comprehension, expression Problem Solving, Memory, Attention, Awareness  TR      Anticipated Outcomes Item Anticipated Outcome  Self Feeding min A  Swallowing  Supervision    Basic self-care  min - mod A  Toileting  max assist   Bathroom Transfers mod assist  Bowel/Bladder  Mod I  Transfers  modA squat pivot  Locomotion  minA w/c propulsion  Communication  Max assist   Cognition  Mod assist   Pain  < 3 on 0-10 pain scale.   Safety/Judgment  Patient will remain free from falls while on Rehab.    Therapy Plan: PT Intensity: Minimum of 1-2 x/day ,45 to 90 minutes PT Frequency: 5 out of 7 days PT Duration Estimated Length of Stay: 5-7 days OT Intensity: Minimum of 1-2 x/day, 45 to 90 minutes OT Frequency: 5 out of 7 days OT Duration/Estimated Length of Stay: 7 days SLP Intensity: Minumum of 1-2 x/day, 30 to 90 minutes SLP Frequency: 3 to 5 out of 7 days SLP Duration/Estimated Length of Stay: 5-7 days        Team Interventions: Nursing Interventions Patient/Family Education, Bladder Management, Bowel Management, Pain Management, Medication Management, Skin Care/Wound Management, Cognitive Remediation/Compensation, Dysphagia/Aspiration Precaution Training, Discharge Planning  PT interventions Balance/vestibular training, Community reintegration, Cognitive remediation/compensation, Discharge planning, Disease management/prevention, DME/adaptive equipment instruction, Functional mobility training, Neuromuscular re-education, Patient/family education, Psychosocial support, Stair training, Therapeutic Exercise, Therapeutic Activities, UE/LE Strength taining/ROM, UE/LE Coordination activities, Wheelchair propulsion/positioning, Ambulation/gait training  OT Interventions Training and development officer, Cognitive remediation/compensation, Discharge planning, Disease mangement/prevention, DME/adaptive equipment instruction, Functional mobility training, Neuromuscular re-education, Pain management, Patient/family education, Psychosocial support, Self Care/advanced ADL retraining, Skin care/wound managment, Splinting/orthotics, Therapeutic Activities, Therapeutic Exercise, UE/LE Strength taining/ROM, UE/LE Coordination activities, Visual/perceptual remediation/compensation, Wheelchair propulsion/positioning  SLP Interventions Cognitive remediation/compensation, English as a second language teacher, Dysphagia/aspiration precaution training, Environmental controls, Functional  tasks, Internal/external aids, Patient/family education, Multimodal communication approach, Speech/Language facilitation, Therapeutic Activities  TR Interventions    SW/CM Interventions Discharge Planning, Psychosocial Support, Patient/Family Education    Team Discharge Planning: Destination: PT-Home ,OT- Home , SLP-Home Projected Follow-up: PT-Home health PT, OT-  Home health  OT, 24 hour supervision/assistance, SLP-24 hour supervision/assistance, Home Health SLP, Outpatient SLP Projected Equipment Needs: PT- , OT- None recommended by OT, SLP-To be determined Equipment Details: PT-equipment reportedly already delivered to home before d/c to acute, OT-should have all equipment at this time Patient/family involved in discharge planning: PT- Patient, Family Midwife,  OT-Patient, Family member/caregiver, SLP-Patient, Family member/caregiver  MD ELOS: 5-7 days Medical Rehab Prognosis:  Fair Assessment: 59 y.o. female with history of breast cancer, HTN, CAD, chronic pain, DM type 2, medical non-compliance who was admitted on 04/11/15 with difficulty speaking, right facial and right sided due to [facility] infarct with occlusion of L-ICA with hemorrhagic conversion. She had was treated with embolectomy and stent with TICI revascularization but developed dense right hemiparesis with global aphasia, apraxia and dysphagia. She had complicated hospital course with VDRF requiring trach and PEG. She was transferred to [facility] for vent wean on 11/02. She tolerated decannulation and was started on dyphagia 2 diet with honey liquids. She was admitted to CIR on 05/16/15. She continued to have dysphagia with poor po intake, bouts of nausea and vomiting as well as nose bleeds. She was made NPO on 12/12 due to black emesis and concerns of GIB. Work up negative and diet resumed. She developed tachycardia with hypotension on 12/14 due to dehydration, RLL PE and was transferred. She was found to have focal acute  non-occlusive DVT in R-CFV. Neurology was consulted for input and Plavix was changed to Xarelto for treatment. She was treated with racemic epi and dexamethasone without improvement in chronic stridor and ENT was consulted for input. Laryngoscopy by Dr. 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 Could consider follow up with ENT at one of [facility] once she improves from recent issues. Family education was almost completed during her rehab stay but she has declined due to immobility on acute due to medical issues. She is admitted back to CIR to complete family education.   See Team Conference Notes for weekly updates to the plan of care

## 2015-06-14 NOTE — Progress Notes (Signed)
[place] PHYSICAL MEDICINE & REHABILITATION     PROGRESS NOTE  Subjective/Complaints: Pt seen this AM resting in bed.  There is no family at bedside.  She appears to have had an uneventful night.   ROS: Limited due to language.   Objective: Vital Signs: Blood pressure 141/76, pulse 88, temperature 97.3 F (36.3 C), temperature source Oral, resp. rate 18, height 5\' 10"  (1.778 m), weight 76.5 kg (168 lb 10.4 oz), SpO2 100 %. No results found.  Recent Labs  06/14/15 0555  WBC 6.9  HGB 11.2*  HCT 36.3  PLT 195    Recent Labs  06/12/15 0640 06/14/15 0555  NA 141 138  K 3.9 3.8  CL 101 98*  GLUCOSE 188* 191*  BUN 8 7  CREATININE 0.32* 0.32*  CALCIUM 9.4 9.1   CBG (last 3)   Recent Labs  06/13/15 1740 06/13/15 2329 06/14/15 0645  GLUCAP 147* 214* 178*    Wt Readings from Last 3 Encounters:  06/14/15 76.5 kg (168 lb 10.4 oz)  06/08/15 80.1 kg (176 lb 9.4 oz)  06/06/15 79.107 kg (174 lb 6.4 oz)    Physical Exam:  BP 141/76 mmHg  Pulse 88  Temp(Src) 97.3 F (36.3 C) (Oral)  Resp 18  Ht 5\' 10"  (1.778 m)  Wt 76.5 kg (168 lb 10.4 oz)  BMI 24.20 kg/m2  SpO2 100% Constitutional: She appears well-developed and well-nourished. Vital signs reviewed.  HENT: Normocephalic and atraumatic. Poor dentition. Conjunctivae and EOM are normal. Neck: Normal range of motion. Neck supple.  Prior tracheostomy stoma well healed.  Cardiovascular: Normal rate and regular rhythm. No murmur heard. Respiratory: Effort normal and breath sounds normal. No respiratory distress. She exhibits no tenderness.  GI: Bowel sounds are normal. She exhibits no distension. There is no tenderness.  Musculoskeletal:  Right knee painful to ROM.  Min edema RUE/RLE  Neurological: She is alert.  Fluent >nonfluent aphasia  Continues to make volitional stridorous sounds.  Dense right hemiparesis.  LUE/LLE 4+/5 proximal to distal RUE/RLE: 0/5 proximal to distal Dysarthria.  She is able to follow  occasional simple motor commands with visual and tactile cues and continues with perseverative behaviors.  Skin: Skin is warm and dry.  Psychiatric: Her affect is inappropriate. Her speech is delayed. Cognition and memory are impaired.   Assessment/Plan: 1. Functional deficits secondary to left MCA infarct and PE which require 3+ hours per day of interdisciplinary therapy in a comprehensive inpatient rehab setting. Physiatrist is providing close team supervision and 24 hour management of active medical problems listed below. Physiatrist and rehab team continue to assess barriers to discharge/monitor patient progress toward functional and medical goals.  Function:  Bathing Bathing position      Bathing parts      Bathing assist        Upper Body Dressing/Undressing Upper body dressing                    Upper body assist        Lower Body Dressing/Undressing Lower body dressing                                  Lower body assist        Toileting Toileting          Toileting assist     Transfers Chair/bed transfer             Locomotion  Ambulation           Wheelchair          Cognition Comprehension Comprehension assist level: Understands basic 50 - 74% of the time/ requires cueing 25 - 49% of the time  Expression Expression assist level: Expresses basic 25 - 49% of the time/requires cueing 50 - 75% of the time. Uses single words/gestures.  Social Interaction Social Interaction assist level: Interacts appropriately 50 - 74% of the time - May be physically or verbally inappropriate.  Problem Solving Problem solving assist level: Solves basic 25 - 49% of the time - needs direction more than half the time to initiate, plan or complete simple activities  Memory Memory assist level: Recognizes or recalls 25 - 49% of the time/requires cueing 50 - 75% of the time    Medical Problem List and Plan 1. Aphasia, dysarhria, hemiplegia, gait  abnormality secondary to left MCA infarct and PE/DVT.  2. PE/RLL DVT /Anticoagulation: Pharmaceutical: Xarelto  3. Pain Management: tylenol prn effective for chronic pain. Patient does not show any signs of distress.  4. Mood: Has lot of underlying psycho-social issues but patient lacks awareness/insight at this time. Pleasant and smiling most of the time. Team and LCSW to work with family to educate on monitoring mood and help facilitate discharge.  5. Neuropsych: This patient is not capable of making decisions on her own behalf. 6. Skin/Wound Care: Maintain adequate nutrition and hydration status. Continue tube feeds. Routine pressure relief measures.  7. Fluids/Electrolytes/Nutrition: Continue tube feeds tid due to  8. HTN: Blood pressures trending upwards again. Continue Prinivil, coreg bid and  9. DM type 2: Monitor BS ac/hs. Resume Levemir titrate as indicated. Use SSI for elevated BS.   Will cont to monitor 10. Sepsis/SIRS: Was treated for E coli UTI prior to discharge on 12/15. Blood cultures X 2 negative. Patient did not have fevers and elevated WBC likely reactive due to DVT/PE. Has completed 5 days of IV antibiotics and 24 hours of oral antibiotic--continued for an additional day and will d/cto avoid side effects/complications.  11. Recent GIB?: Resume pepcid for now. Will resume PPI once antibiotics Completed.  12. Hypoalbuminemia: Will start protein supplementation 13. Anemia  Hb 11.2 on 12/22  Will cont to monitor  LOS (Days) 1 A FACE TO FACE EVALUATION WAS PERFORMED  [name] 06/14/2015 9:20 AM

## 2015-06-14 NOTE — Progress Notes (Signed)
Physical Therapy Session Note  Patient Details  Name: [name] MRN: [id number] Date of Birth: 08-Jun-1956  Today's Date: 06/14/2015 PT Individual Time: 1600-1630 PT Individual Time Calculation (min): 30 min   Short Term Goals: Week 1:  PT Short Term Goal 1 (Week 1): =LTG due to estimated length of stay  Skilled Therapeutic Interventions/Progress Updates:  Pt received resting in bed with elevated respiratory rate, no c/o pain, and reluctant to participate in therapy session.  PT offered sitting in recliner, sitting EOB, standing EOB, and pt ultimately agreeable to rolling to change soiled brief.  Pt rolled R with min assist and L with mod assist.  Pt able to scoot to [facility] with max assist +1 using draw sheet, bed in [place], and pt using LUE to pull and LLE to push.  BP at completion of activity in supine 188/87.  Pt left with call bell in reach and needs met.   Therapy Documentation Precautions:  Precautions Precautions: Fall Precaution Comments: Peg, Rt shoulder subluxation Required Braces or Orthoses: Other Brace/Splint Other Brace/Splint: R foot PRAFO Restrictions Weight Bearing Restrictions: No   See Function Navigator for Current Functional Status.   Therapy/Group: Individual Therapy  Earnest Conroy Penven-Crew 06/14/2015, 4:49 PM

## 2015-06-14 NOTE — Progress Notes (Signed)
Initial Nutrition Assessment  DOCUMENTATION CODES:   Not applicable  INTERVENTION:  Continue 30 ml Prostat po once daily, each supplement provides 100 kcal and 15 grams of protein.   Administer a bolus tube feed of Vital AF 1.2 formula at volume of 237 ml via PEG if meal completion is <50% up to 3 times daily.   Continue free water flushes.  Encourage po intake.   RD to continue to monitor.   NUTRITION DIAGNOSIS:   Inadequate oral intake related to poor appetite as evidenced by meal completion < 50%.  GOAL:   Patient will meet greater than or equal to 90% of their needs  MONITOR:   TF tolerance, PO intake, Supplement acceptance, Weight trends, Labs, I & O's  REASON FOR ASSESSMENT:    (New tube feeding)    ASSESSMENT:   59 y.o. female with history of breast cancer, HTN, CAD, chronic pain, DM type 2, medical non-compliance who was admitted on 04/11/15 with difficulty speaking, right facial and right sided due to [facility] infarct with occlusion of L-ICA with hemorrhagic conversion.  She was admitted to CIR on 05/16/15. She continued to have dysphagia with poor po intake, bouts of nausea and vomiting as well as nose bleeds. She developed tachycardia with hypotension on 12/14 due to dehydration, RLL PE and was transferred to step down unit. She was found to have focal acute non-occlusive DVT in R-CFV.  She is admitted back to CIR.  Pt was unavailable during time of visit, however pt is familiar to this RD due to previous CIR admission. Meal completion has been 0-30%. Per Epic weight records, pt with a 17.2% weight loss in 3 months. Pt currently has Prostat ordered and has been consuming them. Pt additionally has Vital AF 1.2 bolus feeds of 237 ml ordered if po intake at meals is <50%. Pt received a bolus feed this AM. RD to continue with current orders.   Unable to complete Nutrition-Focused physical exam at this time. RD to perform exam during next visit.   Labs and medications  reviewed.   Diet Order:  DIET DYS 2 Room service appropriate?: Yes; Fluid consistency:: Nectar Thick  Skin:  Reviewed, no issues  Last BM:  12/19  Height:   Ht Readings from Last 1 Encounters:  06/13/15 5\' 10"  (1.778 m)    Weight:   Wt Readings from Last 1 Encounters:  06/14/15 168 lb 10.4 oz (76.5 kg)    Ideal Body Weight:  68 kg  BMI:  Body mass index is 24.2 kg/(m^2).  Estimated Nutritional Needs:   Kcal:  1800-2000  Protein:  80-95 grams  Fluid:  1.8 - 2 L/day  EDUCATION NEEDS:   No education needs identified at this time  Corrin Parker, MS, RD, LDN Pager # [phone] After hours/ weekend pager # [phone]

## 2015-06-14 NOTE — Progress Notes (Addendum)
Patient arrived to unit with belongings at bedside.  Fall prevention, safety plan, Rehab process gone over with patient.  No complaints at this time. Will continue to monitor patient.   Unable to complete admission due to patient's expressive aphasia.

## 2015-06-14 NOTE — Progress Notes (Signed)
Social Work  Social Work Assessment and Plan  Patient Details  Name: Kerry [name] MRN: [id number] Date of Birth: 1956/06/21  Today's Date: 06/14/2015  Problem List:  Patient Active Problem List   Diagnosis Date Noted  . HTN (hypertension) 06/13/2015  . Mental status change   . PE (pulmonary embolism) 06/07/2015  . Elevated troponin 06/07/2015  . UTI (urinary tract infection) 06/07/2015  . Coffee ground emesis 06/07/2015  . Sepsis ([place]) 06/07/2015  . Pulmonary embolism without acute cor pulmonale ([place])   . Arterial hypotension   . Hypoxemia   . Stridor   . Sinus tachycardia ([place]) 06/06/2015  . Vomiting blood   . Stroke due to thrombosis of left middle cerebral artery ([place]) 05/16/2015  . Hemiparesis, aphasia, and dysphagia as late effect of cerebrovascular accident (CVA) ([place])   . Essential hypertension   . Coronary artery disease involving native coronary artery of native heart with angina pectoris ([place])   . History of breast cancer   . Fibromyalgia   . Chronic pain syndrome   . Generalized anxiety disorder   . Prerenal azotemia   . Pneumonia   . Chronic respiratory failure ([place])   . Respiratory failure ([place])   . Hypokalemia   . Compromised airway   . HLD (hyperlipidemia)   . Type 2 diabetes mellitus with circulatory disorder ([place])   . Sinus bradycardia   . Asystole ([place])   . Acute respiratory failure ([place])   . Cerebral hemorrhage ([place])   . Cytotoxic cerebral edema ([place])   . Sinus pause   . Cerebrovascular accident (CVA) due to occlusion of left middle cerebral artery ([place])   . Cerebrovascular accident (CVA) due to thrombosis of precerebral artery ([place])   . Encounter for central line placement   . Encounter for feeding tube placement   . CVA (cerebral infarction) 04/11/2015  . Stroke (cerebrum) ([place]) 04/11/2015  . Aphasia   . 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 Hypertensive urgency   . Uncontrolled type 2 diabetes mellitus with hyperosmolarity  without coma, without long-term current use of insulin ([place])   . Breast cancer ([place]) 11/08/2012   Past Medical History:  Past Medical History  Diagnosis Date  . Hypertension   . Coronary artery disease   . Diabetes mellitus   . Breast cancer ([place]) 03/10/2007    Left breat  . Degenerative disc disease, cervical   . Degenerative disc disease, lumbar   . Fibromyalgia   . Hyperlipidemia   . Carpal tunnel syndrome on right   . PE (pulmonary embolism)   . UTI (lower urinary tract infection)   . GIB (gastrointestinal bleeding)   . Elevated troponin   . HTN (hypertension) 06/13/2015   Past Surgical History:  Past Surgical History  Procedure Laterality Date  . Cardiac surgery    . Appendectomy    . Breast surgery    . Mastectomy Left 03/2007  . Cardiac catheterization  2005  . Vein surgery Right 2005  . Tubal ligation Right   . Radiology with anesthesia N/A 04/11/2015    Procedure: RADIOLOGY WITH ANESTHESIA;  Surgeon: Luanne Bras, MD;  Location: [facility];  Service: Radiology;  Laterality: N/A;  . Cardiac catheterization N/A 04/13/2015    Procedure: Temporary Pacemaker;  Surgeon: Peter M Martinique, MD;  Location: [facility];  Service: Cardiovascular;  Laterality: N/A;  . Peg placement N/A 04/18/2015    Procedure: PERCUTANEOUS ENDOSCOPIC GASTROSTOMY (PEG) PLACEMENT;  Surgeon: Judeth Horn, MD;  Location: [facility]  ENDOSCOPY;  Service: General;  Laterality: N/A;  bedside  . Esophagogastroduodenoscopy N/A 04/18/2015    Procedure: ESOPHAGOGASTRODUODENOSCOPY (EGD);  Surgeon: Judeth Horn, MD;  Location: [facility];  Service: General;  Laterality: N/A;   Social History:  reports that she has been smoking.  She has never used smokeless tobacco. She reports that she drinks alcohol. She reports that she does not use illicit drugs.  Family / Support Systems Marital Status: Married Patient Roles: Spouse, Parent Spouse/Significant Other: [name] [phone] Children: Matt-son [phone]    Jeremiah-son [phone] Other Supports: Jeremiah's finance-Amy will assist and lives with pt and husband also Anticipated Caregiver: Family Ability/Limitations of Caregiver: between all of them they can provide 24 hr care Caregiver Availability: 24/7 Family Dynamics: Very committed family two son's and Amy willing to priovide 24 hr care along wiht husband-Thomas. Have had difficulties but have come together now to assist their mother and wife. Pt was living with her boyfriend prior to this hospitalization and [name] and she were separated.   Social History Preferred language: English Religion: Non-Denominational Cultural Background: No issues Education: High School Read: Yes Write: Yes Employment Status: Unemployed Freight forwarder Issues: No issues Guardian/Conservator: None-according to MD pt is not capable of making her own decisions so goes back to husband and sons'. Family here daily and providing support to pt   Abuse/Neglect Physical Abuse: Denies Verbal Abuse: Denies Sexual Abuse: Denies Exploitation of patient/patient's resources: Denies Self-Neglect: Denies  Emotional Status Pt's affect, behavior adn adjustment status: Pt tries to communicate and has gotten to where she can say a few words. She can follow one step commands and gestures. She does make eye contact and is trying her best to follow the therapists commands. She was very active and independent prior to admission and would like to get back to this. Recent Psychosocial Issues: Other health issues-had breast cancer in 2014 stopped working then Pyschiatric History: No history unable to complete depression screen due to her speech/language deficits.  Will ask team and see how she is participating in therpaies. Will see if appropriate during her stay and have neuro-psych see.  It does seem she would benefit from talking to them if able. Substance Abuse History: Tobacco-aware of the risks and plans to quit  now.  Patient / Family Perceptions, Expectations & Goals Pt/Family understanding of illness & functional limitations: Husband and son's have a basic understanding of her stroke and deficits as a result. Her son-Matt is an EMT and works for [employer]  he explains to his Dad and brother what is going on medically.  They are here daily and see medical staff and therapy team to ask any questions they have. Premorbid pt/family roles/activities: Wife, Mother, cancer Survivor, church member, sister, etc Anticipated changes in roles/activities/participation: resume Pt/family expectations/goals: Pt wants to do well and get home, she nods and says yes. Husband states: " I want to get back to where she was before going back to acute, she was doing better and is now weaker."  Son states: " We will all do for her, she's my Momma."  [employer]: None Premorbid Home Care/DME Agencies: None Transportation available at discharge: [employer] referrals recommended: Neuropsychology, Support group (specify)  Discharge Planning Living Arrangements: Spouse/significant other, Children, Non-relatives/Friends Support Systems: Spouse/significant other, Children, Parent, Other relatives, Friends/neighbors Type of Residence: Private residence Insurance Resources: Multimedia programmer (specify), Medicaid (specify county) ([employer]) Museum/gallery curator Resources: Family Support Financial Screen Referred: Previously completed Living Expenses: Own Money Management: Patient, Spouse  Does the patient have any problems obtaining your medications?: Yes (Describe) (was not seeing a MD and taking any medications) Home Management: Patient did prior to admission-will fall on family now Patient/Family Preliminary Plans: Home with husband and son and his financee who paln on providing 24 hr care, would like for her to get back to min assist level where she was before transferring to acute.  Aware she will be a short lenght of stay here. Social Work Anticipated Follow Up Needs: HH/OP, Support Group  Clinical Impression Pleasant female who is smiling and looks happy to be back. Hopefully will do well and get back to the level she was before transferring back to acute for her PE. Husband is here and supportive and willing to  Do what he needs to to assist his wife. Will work on discharge plans and provide support.  Elease Hashimoto 06/14/2015, 9:25 AM

## 2015-06-14 NOTE — Evaluation (Signed)
Occupational Therapy Assessment and Plan  Patient Details  Name: Kerry [name] MRN: [id number] Date of Birth: [birth year]  OT Diagnosis: abnormal posture, cognitive deficits, hemiplegia affecting dominant side, muscle weakness (generalized) and coordination disorder Rehab Potential: Rehab Potential (ACUTE ONLY): Good ELOS: 7 days   Today's Date: 06/14/2015 OT Individual Time: 1100-1200 OT Individual Time Calculation (min): 60 min     Problem List:  Patient Active Problem List   Diagnosis Date Noted  . HTN (hypertension) 06/13/2015  . Mental status change   . PE (pulmonary embolism) 06/07/2015  . Elevated troponin 06/07/2015  . UTI (urinary tract infection) 06/07/2015  . Coffee ground emesis 06/07/2015  . Sepsis ([place]) 06/07/2015  . Pulmonary embolism without acute cor pulmonale ([place])   . Arterial hypotension   . Hypoxemia   . Stridor   . Sinus tachycardia ([place]) 06/06/2015  . Vomiting blood   . 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 Essential hypertension   . Coronary artery disease involving native coronary artery of native heart with angina pectoris ([place])   . History of breast cancer   . Fibromyalgia   . Chronic pain syndrome   . Generalized anxiety disorder   . Prerenal azotemia   . Pneumonia   . Chronic respiratory failure ([place])   . Respiratory failure ([place])   . Hypokalemia   . Compromised airway   . HLD (hyperlipidemia)   . Type 2 diabetes mellitus with circulatory disorder ([place])   . Sinus bradycardia   . Asystole ([place])   . Acute respiratory failure ([place])   . Cerebral hemorrhage ([place])   . Cytotoxic cerebral edema ([place])   . Sinus pause   . Cerebrovascular accident (CVA) due to occlusion of left middle cerebral artery ([place])   . Cerebrovascular accident (CVA) due to thrombosis of precerebral artery ([place])   . Encounter for central  line placement   . Encounter for feeding tube placement   . CVA (cerebral infarction) 04/11/2015  . Stroke (cerebrum) ([place]) 04/11/2015  . Aphasia   . 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 Hypertensive urgency   . Uncontrolled type 2 diabetes mellitus with hyperosmolarity without coma, without long-term current use of insulin ([place])   . Breast cancer ([place]) 11/08/2012    Past Medical History:  Past Medical History  Diagnosis Date  . Hypertension   . Coronary artery disease   . Diabetes mellitus   . Breast cancer ([place]) 03/10/2007    Left breat  . Degenerative disc disease, cervical   . Degenerative disc disease, lumbar   . Fibromyalgia   . Hyperlipidemia   . Carpal tunnel syndrome on right   . PE (pulmonary embolism)   . UTI (lower urinary tract infection)   . GIB (gastrointestinal bleeding)   . Elevated troponin   . HTN (hypertension) 06/13/2015   Past Surgical History:  Past Surgical History  Procedure Laterality Date  . Cardiac surgery    . Appendectomy    . Breast surgery    . Mastectomy Left 03/2007  . Cardiac catheterization  2005  . Vein surgery Right 2005  . Tubal ligation Right   . Radiology with anesthesia N/A 04/11/2015    Procedure: RADIOLOGY WITH ANESTHESIA;  Surgeon: Luanne Bras, MD;  Location: [facility];  Service: Radiology;  Laterality: N/A;  . Cardiac catheterization N/A 04/13/2015    Procedure: Temporary Pacemaker;  Surgeon: Peter M Martinique, MD;  Location: [place];  Service: Cardiovascular;  Laterality: N/A;  . Peg placement N/A 04/18/2015    Procedure: PERCUTANEOUS ENDOSCOPIC GASTROSTOMY (PEG) PLACEMENT;  Surgeon: Judeth Horn, MD;  Location: [facility];  Service: General;  Laterality: N/A;  bedside  . Esophagogastroduodenoscopy N/A 04/18/2015    Procedure: ESOPHAGOGASTRODUODENOSCOPY (EGD);  Surgeon: Judeth Horn, MD;  [facility] ENDOSCOPY;  Service: General;  Laterality: N/A;    Assessment & Plan Clinical Impression: Patient is a  59 y.o. year old female with history of breast cancer, HTN, CAD, chronic pain, DM type 2, medical non-compliance who was admitted on 04/11/15 with difficulty speaking, right facial and right sided due to [facility] infarct with occlusion of L-ICA with hemorrhagic conversion. She had was treated with embolectomy and stent with TICI revascularization but developed dense right hemiparesis with global aphasia, apraxia and dysphagia. She had complicated hospital course with VDRF requiring trach and PEG. She was transferred to [facility] for vent wean on 11/02. She tolerated decannulation and was started on dyphagia 2 diet with honey liquids. She was showing improvement in activity tolerance and was felt to be ready for intensive therapy program.   She was admitted to CIR on 05/16/15. She continued to have dysphagia with poor po intake, bouts of nausea and vomiting as well as nose bleeds. She was made NPO on 12/12 due to black emesis and concerns of GIB. Work up negative and diet resumed. She developed tachycardia with hypotension on 12/14 due to dehydration, RLL PE and was transferred to step down unit. She was found to have focal acute non-occlusive DVT in R-CFV. Neurology was consulted for input and Plavix was changed to Xarelto for treatment. She was treated with racemic epi and dexamethasone without improvement in chronic stridor and ENT was consulted for input. Laryngoscopy by Dr. 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 Could consider follow up with ENT at one of [facility] once she improves from recent issues. Family education was almost completed during her rehab stay but she has declined due to immobility on acute due to medical issues. She is admitted back to CIR to complete family education. Patient transferred to CIR on 06/13/2015 .    Patient currently requires max A with basic self-care skills  secondary to muscle weakness, decreased cardiorespiratoy endurance, abnormal tone, decreased coordination and decreased motor planning, decreased attention, decreased awareness, decreased safety awareness and delayed processing and decreased sitting balance, decreased standing balance, decreased postural control, hemiplegia and decreased balance strategies.  Prior to hospitalization, patient could complete ADLs and IALDs with independent .  Patient will benefit from skilled intervention to decrease level of assist with basic self-care skills and and family education prior to discharge home with care partner.  Anticipate patient will require 24 hour supervision and moderate physical assestance and follow up home health.  OT - End of Session Activity Tolerance: Decreased this session Endurance Deficit: Yes Endurance Deficit Description: several rest breaks throughout OT Assessment Rehab Potential (ACUTE ONLY): Good Barriers to Discharge: Other (comment) Barriers to Discharge Comments: none known at this time OT Patient demonstrates impairments in the following area(s): Balance;Pain;Endurance;Perception;Safety;Sensory;Cognition OT Basic ADL's Functional Problem(s): Eating;Grooming;Bathing;Dressing;Toileting OT Transfers Functional Problem(s): Toilet;Tub/Shower OT Additional Impairment(s): Fuctional Use of Upper Extremity OT Plan OT Intensity: Minimum of 1-2 x/day, 45 to 90 minutes OT Frequency: 5 out of 7 days OT Duration/Estimated Length of Stay: 7 days  OT Treatment/Interventions: Balance/vestibular training;Cognitive remediation/compensation;Discharge planning;Disease mangement/prevention;DME/adaptive equipment instruction;Functional mobility training;Neuromuscular re-education;Pain management;Patient/family education;Psychosocial support;Self Care/advanced ADL retraining;Skin care/wound managment;Splinting/orthotics;Therapeutic Activities;Therapeutic Exercise;UE/LE Strength taining/ROM;UE/LE  Coordination activities;Visual/perceptual remediation/compensation;Wheelchair propulsion/positioning OT Self Feeding Anticipated Outcome(s): min A OT Basic Self-Care Anticipated Outcome(s): min - mod A OT Toileting Anticipated Outcome(s): max assist OT Bathroom Transfers Anticipated Outcome(s): mod assist OT Recommendation Patient destination: Home Follow Up Recommendations: Home health OT;24 hour supervision/assistance Equipment Recommended: None recommended by OT Equipment Details: should have all equipment at this time   Skilled Therapeutic Intervention Upon entering the room, pt supine in bed with husband, son, and son's fiance present in the room. Pt required coaxing for participation this session. Pt performed supine >sit with mod A for trunk and R LE. Once seated on EOB, pt able to sit for 15 minutes with SBA- min A for balance. Pt sweating and RN arrived. NT took blood glucose and tempt. BP was elevated this session. Pt declined all out of bed task as well and bathing and dressing. Pt very clearly said, "Hell no!" Pt became resistive and attempting to "throw" herself back into bed. OT positioned pt in order to protect hemiplegic side. Handout provided to family regarding bed positioning. OT also educated caregivers on PROM exercises for R digits,wrist, and elbow with paper handout provided. Her son returned demonstrations with min verbal cues for proper technique. OT educated pt and family on OT purpose, POC, and goals with them verbalizing understanding. Pt remained in bed with family present and call bell within reach.   OT Evaluation Precautions/Restrictions  Precautions Precautions: Fall Precaution Comments: Peg, Rt shoulder subluxation Required Braces or Orthoses: Other Brace/Splint Other Brace/Splint: R foot PRAFO Restrictions Weight Bearing Restrictions: No General   Vital Signs Therapy Vitals Pulse Rate: (!) 117 BP: (!) 208/113 mmHg Patient Position (if appropriate):  Lying Oxygen Therapy SpO2: 93 % O2 Device: Nasal Cannula O2 Flow Rate (L/min): 2 L/min Pain Pain Assessment Pain Assessment: Faces Faces Pain Scale: No hurt Home Living/Prior Functioning Home Living Living Arrangements: Spouse/significant other, Children, Non-relatives/Friends Available Help at Discharge: Family, Available 24 hours/day Type of Home: House Home Access: Stairs to enter Secretary/administrator of Steps: 3 Entrance Stairs-Rails: Right, Left, Can reach both Home Layout: Two level, Able to live on main level with bedroom/bathroom Bathroom Shower/Tub: Tub/shower unit, Engineer, building services: Standard Bathroom Accessibility: Yes Additional Comments: Info from family, in agreement with admission documentation  Lives With: Spouse, Son, Other (Comment) (son's fiance) IADL History 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 Prior Function Level of Independence: Independent with basic ADLs, Independent with homemaking with ambulation, Independent with gait, Independent with transfers (prior to 1st admission to inpatient rehab with CVA diagnoses)  Able to Take Stairs?: Yes Driving: Yes 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 Leisure: Hobbies-yes (Comment) Comments: Previously on CIR, preparing for d/c and family in process of family educated Vision/Perception  Vision- History Baseline Vision/History: Wears glasses Wears Glasses:  ("when needed" according to previous documentation) Patient Visual Report: Other (comment) (unable to report) Vision- Assessment Vision Assessment?: Vision impaired- to be further tested in functional context  Cognition Overall Cognitive Status: Impaired/Different from baseline Arousal/Alertness: Awake/alert Orientation Level: Nonverbal/unable to assess Memory:  (difficult to assess d/t aphasia; pt remembered therapist from previous CIR stay) Attention: Sustained;Selective Sustained Attention: Appears intact Selective Attention: Impaired Selective  Attention Impairment: Functional basic;Verbal basic Awareness: Impaired Awareness Impairment: Emergent impairment Problem Solving: Impaired Problem Solving Impairment: Verbal basic;Functional basic Behaviors: Perseveration Safety/Judgment: Impaired Sensation Sensation Light Touch: Not tested (Not formally  tested d/t aphasia, inconsistent yes/no) Stereognosis: Not tested Hot/Cold: Not tested Proprioception: Not tested (NT due to aphasia, inconsistent yes/no) Additional Comments: not tested secondary to aphasia and inconsistent yes and no answers Coordination Gross Motor Movements are Fluid and Coordinated: No Fine Motor Movements are Fluid and Coordinated: No Heel Shin Test: unable to assess due to impaired attention, intitation, RLE weakness, apraxia Motor  Motor Motor: Hemiplegia;Abnormal postural alignment and control;Motor impersistence;Motor perseverations;Motor apraxia Motor - Skilled Clinical Observations: R lateral trunk lean, R extremities flaccid Mobility  Bed Mobility Bed Mobility: Rolling Right;Rolling Left;Supine to Sit;Sit to Supine Rolling Right: 4: Min assist;With rail Rolling Right Details: Tactile cues for placement;Verbal cues for precautions/safety;Verbal cues for sequencing;Verbal cues for technique Rolling Left: 4: Min assist;With rail Rolling Left Details: Tactile cues for weight beaing;Tactile cues for placement;Verbal cues for technique;Verbal cues for precautions/safety;Verbal cues for sequencing Supine to Sit: 3: Mod assist Supine to Sit Details: Verbal cues for sequencing;Verbal cues for technique;Tactile cues for weight beaing;Tactile cues for placement;Manual facilitation for placement Sit to Supine: 3: Mod assist Sit to Supine - Details: Verbal cues for sequencing;Verbal cues for technique;Verbal cues for precautions/safety;Manual facilitation for placement;Manual facilitation for weight shifting  Trunk/Postural Assessment  Cervical Assessment Cervical  Assessment: Exceptions to [facility] (forward head) Thoracic Assessment Thoracic Assessment: Exceptions to [facility] (kyphotic and rounded shoulders) Lumbar Assessment Lumbar Assessment:  (posterior pelvic tilt) Postural Control Postural Control: Deficits on evaluation  Balance Balance Balance Assessed: Yes Static Sitting Balance Static Sitting - Balance Support: Left upper extremity supported;Feet supported Static Sitting - Level of Assistance: 5: Stand by assistance Dynamic Sitting Balance Dynamic Sitting - Balance Support: No upper extremity supported;Feet supported Dynamic Sitting - Level of Assistance: 4: Min assist Dynamic Sitting Balance - Compensations: able to recover with LUE when LOB with minA Sitting balance - Comments: Initially maxA sitting balance, improved to close S with LUE support on bedrail. With bathing using LUE to wash chest, several LOB to L side requiring use of LUE on bed or rail and min A to recover Static Standing Balance Static Standing - Balance Support:  (NT) Extremity/Trunk Assessment RUE Assessment RUE Assessment: Exceptions to [facility] RUE AROM (degrees) RUE Overall AROM Comments: no noted AROM, per family report pt does demonstrate occasional volitional movement however unsure if due to tone, apraxia  RUE Strength RUE Overall Strength Comments: 0/5 throughout LUE Assessment LUE Assessment: Within Functional Limits   See Function Navigator for Current Functional Status.   Refer to Care Plan for Long Term Goals  Recommendations for other services: None  Discharge Criteria: Patient will be discharged from OT if patient refuses treatment 3 consecutive times without medical reason, if treatment goals not met, if there is a change in medical status, if patient makes no progress towards goals or if patient is discharged from hospital.  The above assessment, treatment plan, treatment alternatives and goals were discussed and mutually agreed upon: by patient and by  family  Phineas Semen 06/14/2015, 5:02 PM

## 2015-06-15 ENCOUNTER — Inpatient Hospital Stay (HOSPITAL_COMMUNITY): Payer: 59 | Admitting: Occupational Therapy

## 2015-06-15 ENCOUNTER — Inpatient Hospital Stay (HOSPITAL_COMMUNITY): Payer: 59 | Admitting: Speech Pathology

## 2015-06-15 ENCOUNTER — Encounter (HOSPITAL_COMMUNITY): Payer: 59 | Admitting: Speech Pathology

## 2015-06-15 ENCOUNTER — Inpatient Hospital Stay (HOSPITAL_COMMUNITY): Payer: 59

## 2015-06-15 ENCOUNTER — Inpatient Hospital Stay (HOSPITAL_COMMUNITY): Payer: 59 | Admitting: Physical Therapy

## 2015-06-15 DIAGNOSIS — R000 Tachycardia, unspecified: Secondary | ICD-10-CM

## 2015-06-15 LAB — GLUCOSE, CAPILLARY
GLUCOSE-CAPILLARY: 223 mg/dL — AB (ref 65–99)
GLUCOSE-CAPILLARY: 291 mg/dL — AB (ref 65–99)
Glucose-Capillary: 266 mg/dL — ABNORMAL HIGH (ref 65–99)
Glucose-Capillary: 287 mg/dL — ABNORMAL HIGH (ref 65–99)

## 2015-06-15 MED ORDER — INSULIN DETEMIR 100 UNIT/ML ~~LOC~~ SOLN
9.0000 [IU] | Freq: Every day | SUBCUTANEOUS | Status: DC
  Administered 2015-06-16 – 2015-06-18 (×3): 9 [IU] via SUBCUTANEOUS
  Filled 2015-06-15 (×4): qty 0.09

## 2015-06-15 MED ORDER — CARVEDILOL 25 MG PO TABS
25.0000 mg | ORAL_TABLET | Freq: Two times a day (BID) | ORAL | Status: DC
  Administered 2015-06-15 – 2015-06-18 (×6): 25 mg via ORAL
  Filled 2015-06-15 (×7): qty 1

## 2015-06-15 NOTE — Progress Notes (Signed)
[place] PHYSICAL MEDICINE & REHABILITATION     PROGRESS NOTE  Subjective/Complaints:  Patient seen this morning resting in her bed. She says yes to everything that's asked..   ROS: Limited due to language.   Objective: Vital Signs: Blood pressure 162/84, pulse 106, temperature 97.7 F (36.5 C), temperature source Oral, resp. rate 26, height 5\' 10"  (1.778 m), weight 76.5 kg (168 lb 10.4 oz), SpO2 98 %. No results found.  Recent Labs  06/14/15 0555  WBC 6.9  HGB 11.2*  HCT 36.3  PLT 195    Recent Labs  06/14/15 0555  NA 138  K 3.8  CL 98*  GLUCOSE 191*  BUN 7  CREATININE 0.32*  CALCIUM 9.1   CBG (last 3)   Recent Labs  06/14/15 2205 06/14/15 2318 06/15/15 0614  GLUCAP 263* 231* 223*    Wt Readings from Last 3 Encounters:  06/14/15 76.5 kg (168 lb 10.4 oz)  06/08/15 80.1 kg (176 lb 9.4 oz)  06/06/15 79.107 kg (174 lb 6.4 oz)    Physical Exam:  BP 162/84 mmHg  Pulse 106  Temp(Src) 97.7 F (36.5 C) (Oral)  Resp 26  Ht 5\' 10"  (1.778 m)  Wt 76.5 kg (168 lb 10.4 oz)  BMI 24.20 kg/m2  SpO2 98% Constitutional: She appears well-developed and well-nourished. Vital signs reviewed.  HENT: Normocephalic and atraumatic. Poor dentition. Conjunctivae and EOM are normal. Neck: Normal range of motion. Neck supple.  Prior tracheostomy stoma well healed.  Cardiovascular: Normal rate and regular rhythm. No murmur heard. Respiratory: Effort normal and breath sounds normal. No respiratory distress. She exhibits no tenderness.  GI: Bowel sounds are normal. She exhibits no distension. There is no tenderness.  Musculoskeletal:  Min edema RUE/RLE  Neurological: She is alert.  Fluent >nonfluent aphasia  Continues to make volitional stridorous sounds.  Dense right hemiparesis.  LUE/LLE 4+/5 proximal to distal RUE/RLE: 0/5 proximal to distal Dysarthria.  She is able to follow occasional simple motor commands with visual and tactile cues and continues with  perseverative behaviors.  Skin: Skin is warm and dry.  Psychiatric: Her affect is inappropriate. Her speech is delayed. Cognition and memory are impaired.   Assessment/Plan: 1. Functional deficits secondary to left MCA infarct and PE which require 3+ hours per day of interdisciplinary therapy in a comprehensive inpatient rehab setting. Physiatrist is providing close team supervision and 24 hour management of active medical problems listed below. Physiatrist and rehab team continue to assess barriers to discharge/monitor patient progress toward functional and medical goals.  Function:  Bathing Bathing position   Position: Bed  Bathing parts Body parts bathed by patient: Chest, Abdomen, Front perineal area, Right upper leg, Left upper leg Body parts bathed by helper: Right arm, Left arm, Buttocks, Right lower leg, Left lower leg  Bathing assist Assist Level:  (Mod assist)      Upper Body Dressing/Undressing Upper body dressing   What is the patient wearing?: Pull over shirt/dress     Pull over shirt/dress - Perfomed by patient: Thread/unthread left sleeve, Put head through opening Pull over shirt/dress - Perfomed by helper: Pull shirt over trunk, Thread/unthread right sleeve        Upper body assist Assist Level:  (Mod assist)      Lower Body Dressing/Undressing Lower body dressing Lower body dressing/undressing activity did not occur: Refused What is the patient wearing?: Pants, Non-skid slipper socks       Pants- Performed by helper: Thread/unthread right pants leg, Thread/unthread left pants  leg, Pull pants up/down   Non-skid slipper socks- Performed by helper: Don/doff right sock, Don/doff left sock                  Lower body assist Assist for lower body dressing:  (Total assist)      Toileting Toileting          Toileting assist     Transfers Chair/bed transfer Chair/bed transfer activity did not occur: Refused           Locomotion Ambulation  Ambulation activity did not occur: Scientist, research (physical sciences) activity did not occur: Refused        Cognition Comprehension Comprehension assist level: Understands complex 90% of the time/cues 10% of the time  Expression Expression assist level: Expresses basic 25 - 49% of the time/requires cueing 50 - 75% of the time. Uses single words/gestures., Expresses basic 50 - 74% of the time/requires cueing 25 - 49% of the time. Needs to repeat parts of sentences.  Social Interaction Social Interaction assist level: Interacts appropriately with others - No medications needed.  Problem Solving Problem solving assist level: Solves basic 50 - 74% of the time/requires cueing 25 - 49% of the time  Memory Memory assist level: Recognizes or recalls 25 - 49% of the time/requires cueing 50 - 75% of the time    Medical Problem List and Plan 1. 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   Continue CIR 2. PE/RLL DVT /Anticoagulation: Pharmaceutical: Xarelto  Spoke to therapies regarding spot checks of O2 sats  3. Pain Management: tylenol prn effective for chronic pain.  4. Mood: Has lot of underlying psycho-social issues but patient lacks awareness/insight at this time. Pleasant and smiling most of the time. Team and LCSW to work with family to educate on monitoring mood and help facilitate discharge.  5. Neuropsych: This patient is not capable of making decisions on her own behalf. 6. Skin/Wound Care: Maintain adequate nutrition and hydration status. Continue tube feeds. Routine pressure relief measures.  7. Fluids/Electrolytes/Nutrition: Continue tube feeds tid due to  8. HTN: Blood pressures trending upwards again.   Continue Prinivil  Coreg increased to 25 on 12/23  9. DM type 2: Monitor BS ac/hs. Use SSI for elevated BS.   Levemir increased to 9U on 12/24  Will cont to monitor 10. Sepsis/SIRS: Was treated for E coli UTI prior to  discharge on 12/15. Blood cultures X 2 negative. Patient did not have fevers and elevated WBC likely reactive due to DVT/PE. Has completed 5 days of IV antibiotics and 24 hours of oral antibiotic--continued for an additional day and will d/cto avoid side effects/complications.  11. Recent GIB?: Resume pepcid for now. Will resume PPI once antibiotics Completed.  12. Hypoalbuminemia: protein supplementation started 12/22 13. Anemia  Hb 11.2 on 12/22  Will cont to monitor  LOS (Days) 2 A FACE TO FACE EVALUATION WAS PERFORMED  Sully Manzi [name] 06/15/2015 11:07 AM

## 2015-06-15 NOTE — Progress Notes (Signed)
Physical Therapy Session Note  Patient Details  Name: Kerry [name] MRN: [id number] Date of Birth: 1956-04-03  Today's Date: 06/15/2015 PT Individual Time: 1300-1415 PT Individual Time Calculation (min): 75 min   Short Term Goals: Week 1:  PT Short Term Goal 1 (Week 1): =LTG due to estimated length of stay  Skilled Therapeutic Interventions/Progress Updates:    Pt received seated in bed with no evidence of pain via FACES scale, agreeable to treatment. Supine>sit modA with HOB elevated and bedrails. Seated on EOB standbyA with LUE elbow on bed for stability, therapist donned B shoes with totalA. Squat pivot bed>w/c maxA with pt assisting with L extremities. Seated in w/c, assisted pt with set-up of lunch tray. Pt ate several bites of mashed potatoes and occasional sips of drink before pushing away tray and indicating she was finished. Seated at sink pt performed hair brushing; required cues for turning comb in correct direction. Seated balance with LUE reaching to encourage trunk flexion, core control and stability with matching cards to board in front of pt. Mod verbal cues for correct matching due to confusing different suits. Sit <>stand with LUE on parallel bars with maxA; unable to maintain standing >5 seconds before pt indicating she wanted to sit. Pt indicating she does not want to continue, waving away therapist and resting head in her hands. Returned to room totalA.  Vitals maintained throughout session; pt received on 2L O2 via [place], removed for session and pt remained above 95% on room air throughout. Initial BP seated in w/c 148/91 with HR 106, gradually reduced over session to 123/78 with HR with 110 at completion of session. Algis Liming, PA alerted to vitals and asked if TEDs were appropriate to prevent vitals from continuing to drop however PA did not want to increase BP, and pt asymptomatic. Remained seated in w/c with all needs in reach at completion of session.   Therapy  Documentation Precautions:  Precautions Precautions: Fall Precaution Comments: Peg, Rt shoulder subluxation Required Braces or Orthoses: Other Brace/Splint Other Brace/Splint: R foot PRAFO Restrictions Weight Bearing Restrictions: No Pain: Pain Assessment Pain Assessment: No/denies pain Faces Pain Scale: No hurt   See Function Navigator for Current Functional Status.   Therapy/Group: Individual Therapy  Luberta Mutter 06/15/2015, 3:38 PM

## 2015-06-15 NOTE — Progress Notes (Signed)
MBSS complete. Full report located under chart review in imaging section.  

## 2015-06-15 NOTE — Progress Notes (Signed)
Occupational Therapy Session Note  Patient Details  Name: Kerry [name] MRN: [id number] Date of Birth: 1955/09/29  Today's Date: 06/15/2015 OT Individual Time: 1415-1445 OT Individual Time Calculation (min): 30 min    Short Term Goals: Week 1:  OT Short Term Goal 1 (Week 1): STGs=LTGs secondary to short estimated LOS  Skilled Therapeutic Interventions/Progress Updates:    1:1 Pt with increased anxiousness this session- RN bought anxiety meds at beginning of session.  See below for vitals. Pt engaged in positioning and stretching to promote chest extension and shoulder retraction for increased breath support. Pt able to initiate some shoulder elevation and depression with tactile and verbal cues but required total A for protraction and retraction.  Pt returned to room and into bed with max A squat pivot transfer.    Therapy Documentation Precautions:  Precautions Precautions: Fall Precaution Comments: Peg, Rt shoulder subluxation Required Braces or Orthoses: Other Brace/Splint Other Brace/Splint: R foot PRAFO Restrictions Weight Bearing Restrictions: No    Vital Signs:  Pain: Pain Assessment Pain Assessment: No/denies pain Faces Pain Scale: No hurt  See Function Navigator for Current Functional Status.   Therapy/Group: Individual Therapy  [name] [facility] 06/15/2015, 2:54 PM

## 2015-06-15 NOTE — Progress Notes (Signed)
Occupational Therapy Session Note  Patient Details  Name: Kerry [name] MRN: [id number] Date of Birth: 14-Jul-1955  Today's Date: 06/15/2015 OT Individual Time: 0730-0825 OT Individual Time Calculation (min): 55 min    Short Term Goals: Week 1:  OT Short Term Goal 1 (Week 1): STGs=LTGs secondary to short estimated LOS  Skilled Therapeutic Interventions/Progress Updates:    ADL retraining with focus on sequencing, initiation, following directions, and sitting balance.  Pt in bed upon arrival, monitored sats throughout session.  RR 28-32 throughout session.  Bathing completed at bed level with pt requiring tactile cues for sequencing with bathing as pt would perseverate on washing face.  Rolling in bed to complete perineal hygiene and change incontinent brief with pt able to utilize grab bars and could roll to Rt with min/steady assist and roll to Lt with min assist after setup of positioning of RUE and RLE.  Pt assisted with pulling pants over hips with partial bridging, able to pull pants over Lt hip.  Engaged in [place] seated at EOB, with pt requiring mod-max assist to maintain upright sitting balance as pt tends to lose balance to Rt.  Attempted to utilize bed rails to assist with sitting balance with pt unable to sustain attention and maintain positioning.  Returned to semi-reclined in bed secondary to refusal to transfer OOB as well as pt having modified after session.    Therapy Documentation Precautions:  Precautions Precautions: Fall Precaution Comments: Peg, Rt shoulder subluxation Required Braces or Orthoses: Other Brace/Splint Other Brace/Splint: R foot PRAFO Restrictions Weight Bearing Restrictions: No General:   Vital Signs: Therapy Vitals Pulse Rate: (!) 106 BP: (!) 162/84 mmHg Patient Position (if appropriate): Lying Oxygen Therapy SpO2: 98 % O2 Device: Nasal Cannula O2 Flow Rate (L/min): 2 L/min Pain:  Utilized faces scale due to aphasia with no pain  throughout session.  See Function Navigator for Current Functional Status.   Therapy/Group: Individual Therapy  Simonne Come 06/15/2015, 10:09 AM

## 2015-06-16 ENCOUNTER — Inpatient Hospital Stay (HOSPITAL_COMMUNITY): Payer: 59 | Admitting: Occupational Therapy

## 2015-06-16 ENCOUNTER — Inpatient Hospital Stay (HOSPITAL_COMMUNITY): Payer: 59

## 2015-06-16 ENCOUNTER — Inpatient Hospital Stay (HOSPITAL_COMMUNITY): Payer: 59 | Admitting: Speech Pathology

## 2015-06-16 ENCOUNTER — Inpatient Hospital Stay (HOSPITAL_COMMUNITY): Payer: 59 | Admitting: Physical Therapy

## 2015-06-16 DIAGNOSIS — M797 Fibromyalgia: Secondary | ICD-10-CM

## 2015-06-16 LAB — URINALYSIS, ROUTINE W REFLEX MICROSCOPIC
Bilirubin Urine: NEGATIVE
GLUCOSE, UA: NEGATIVE mg/dL
HGB URINE DIPSTICK: NEGATIVE
KETONES UR: 15 mg/dL — AB
Leukocytes, UA: NEGATIVE
Nitrite: NEGATIVE
PH: 5.5 (ref 5.0–8.0)
PROTEIN: NEGATIVE mg/dL
Specific Gravity, Urine: 1.03 (ref 1.005–1.030)

## 2015-06-16 LAB — CBC
HCT: 41.8 % (ref 36.0–46.0)
Hemoglobin: 12.6 g/dL (ref 12.0–15.0)
MCH: 27.8 pg (ref 26.0–34.0)
MCHC: 30.1 g/dL (ref 30.0–36.0)
MCV: 92.1 fL (ref 78.0–100.0)
PLATELETS: 310 10*3/uL (ref 150–400)
RBC: 4.54 MIL/uL (ref 3.87–5.11)
RDW: 15.1 % (ref 11.5–15.5)
WBC: 12.7 10*3/uL — AB (ref 4.0–10.5)

## 2015-06-16 LAB — BASIC METABOLIC PANEL
Anion gap: 8 (ref 5–15)
BUN: 28 mg/dL — AB (ref 6–20)
CALCIUM: 9.6 mg/dL (ref 8.9–10.3)
CO2: 34 mmol/L — ABNORMAL HIGH (ref 22–32)
CREATININE: 0.48 mg/dL (ref 0.44–1.00)
Chloride: 97 mmol/L — ABNORMAL LOW (ref 101–111)
GFR calc Af Amer: >60 mL/min (ref >60)
Glucose, Bld: 231 mg/dL — ABNORMAL HIGH (ref 65–99)
Potassium: 4.5 mmol/L (ref 3.5–5.1)
SODIUM: 139 mmol/L (ref 135–145)

## 2015-06-16 LAB — GLUCOSE, CAPILLARY
GLUCOSE-CAPILLARY: 198 mg/dL — AB (ref 65–99)
GLUCOSE-CAPILLARY: 235 mg/dL — AB (ref 65–99)
GLUCOSE-CAPILLARY: 222 mg/dL — AB (ref 65–99)
Glucose-Capillary: 228 mg/dL — ABNORMAL HIGH (ref 65–99)

## 2015-06-16 LAB — LACTIC ACID, PLASMA: Lactic Acid, Venous: 1.1 mmol/L (ref 0.5–2.0)

## 2015-06-16 MED ORDER — RESOURCE THICKENUP CLEAR PO POWD
ORAL | Status: DC | PRN
  Filled 2015-06-16: qty 125

## 2015-06-16 MED ORDER — POTASSIUM CHLORIDE 20 MEQ/15ML (10%) PO SOLN
30.0000 meq | Freq: Every day | ORAL | Status: DC
  Administered 2015-06-17 – 2015-06-18 (×2): 30 meq via ORAL
  Filled 2015-06-16 (×2): qty 30

## 2015-06-16 NOTE — Progress Notes (Signed)
Speech Language Pathology Daily Session Note  Patient Details  Name: Kerry [name] MRN: [id number] Date of Birth: October 09, 1955  Today's Date: 06/16/2015 SLP Individual Time: 1120-1220 SLP Individual Time Calculation (min): 60 min  Short Term Goals: Week 1: SLP Short Term Goal 1 (Week 1): STG=LTG due to ELOS  Skilled Therapeutic Interventions:  Pt was seen for skilled ST targeting family education.  Pt's husband, son, and son's fiancee were present during today's therapy session.  SLP provided skilled education regarding how to thicken liquids to the appropriate viscosity, where to purchase thickened liquids, and how to determine when liquids reach the appropriate viscosity.  SLP also provided skilled education regarding rationale behind pt's currently recommended diet and swallow precautions per yesterday's MBS.  Pt's family aware that pt is at an increased risk of aspiration due to her CVA in addition to her newly onset debility and respiratory compromise s/p PE (pt continues to have increased respiratory rate).  SLP provided pt's family with handouts of how to thicken liquids and of appropriate dys 2 textures, including which textures to avoid.  SLP also faciltated the session with skilled education regarding supported conversation techniques to maximize pt's functional independence for communication in the home environment, emphasizing allowing pt multimodal means of communication to convey needs/wants to caregivers.  All questions were answered to family's satisfaction at this time.  Continue per current plan of care.    Function:  Eating Eating   Modified Consistency Diet: Yes Eating Assist Level: Supervision or verbal cues;Set up assist for   Eating Set Up Assist For: Opening containers       Cognition Comprehension Comprehension assist level: Understands basic 75 - 89% of the time/ requires cueing 10 - 24% of the time  Expression   Expression assist level: Expresses basic 25 -  49% of the time/requires cueing 50 - 75% of the time. Uses single words/gestures.  Social Interaction Social Interaction assist level: Interacts appropriately 50 - 74% of the time - May be physically or verbally inappropriate.  Problem Solving Problem solving assist level: Solves basic 50 - 74% of the time/requires cueing 25 - 49% of the time  Memory Memory assist level: Recognizes or recalls 25 - 49% of the time/requires cueing 50 - 75% of the time    Pain Pain Assessment Pain Assessment: Faces Faces Pain Scale: Hurts a little bit Pain Location: Generalized Pain Descriptors / Indicators: Grimacing;Discomfort Pain Intervention(s): Repositioned  Therapy/Group: Individual Therapy  Tillman Kazmierski, Selinda Orion 06/16/2015, 2:54 PM

## 2015-06-16 NOTE — Progress Notes (Signed)
Occupational Therapy Session Note  Patient Details  Name: TERENA BLUEFORD MRN: [id number] Date of Birth: 1956/02/10  Today's Date: 06/16/2015 OT Individual Time: 0700-0800 OT Individual Time Calculation (min): 60 min    Short Term Goals: Week 1:  OT Short Term Goal 1 (Week 1): STGs=LTGs secondary to short estimated LOS  Skilled Therapeutic Interventions/Progress Updates:    Pt seen for OT session focusing on functional mobility, transfers, and self feeding. Pt in supine upon arrival, voicing that she had had incontinent episode in brief. Pt rolled with min A rolling to R and mod-max A rolling to lift for hygiene to be completed and new brief donned. LB dressing completed in supine. She transferred to EOB and +2 assist used for squat pivot transfer to w/c at near total A with max VCs provided for weight shift and hand placement during transfer.Pt dressed UB seated in chair requiring encouragement to attempt all steps. She ate breakfast seated in w/c with set-up assist to open containers and max VCs to follow swallowing precautions and alternating solids/ liquids.  Pt left sitting up in w/c at end of session, NT made aware of pt's position.   Therapy Documentation Precautions:  Precautions Precautions: Fall Precaution Comments: Peg, Rt shoulder subluxation Required Braces or Orthoses: Other Brace/Splint Other Brace/Splint: R foot PRAFO Restrictions Weight Bearing Restrictions: No Pain:   no/ denies pain  See Function Navigator for Current Functional Status.   Therapy/Group: Individual Therapy  Lewis, Gwinda Passe 06/16/2015, 6:38 AM

## 2015-06-16 NOTE — Progress Notes (Signed)
Labored breathing noted. Rt evaluated, Rapid response RN evaluated. MD contacted. Orders given for chest x-ray, labs.

## 2015-06-16 NOTE — Significant Event (Addendum)
Rapid Response Event Note  Overview: Time Called: 1558 Arrival Time: 1600 Event Type: Respiratory  Initial Focused Assessment:  Called by primary Rn for patient with increased WOB.  Upon my arrival to patients room, Patient in bed with nasal cannula on with increased, labored WOB.  Patient is awake.  Skin warm.  RR 36, Spo2 100%, HR 88.  Patient is using accessory muscles, somewhat paradoxical breathing pattern.   Asked patient if having trouble breathing, she stated a little.  Breath sounds Rhonchi   Interventions:  Called RT for PRN breathing treatment.  Recommend calling MD to update on findings.     Event Summary:  Rn to call if assistance needed   at      at          [facility], [place]

## 2015-06-16 NOTE — Progress Notes (Signed)
Kerry [name] is a 59 y.o. female 1956-06-09 [id number]  Subjective: No new complaints. No new problems. Slept well. Feeling OK.  Objective: Vital signs in last 24 hours: Temp:  [97.6 F (36.4 C)-97.8 F (36.6 C)] 97.6 F (36.4 C) (12/24 0427) Pulse Rate:  [89-107] 105 (12/24 0849) Resp:  [20-24] 20 (12/24 0427) BP: (116-133)/(61-106) 121/66 mmHg (12/24 0849) SpO2:  [96 %] 96 % (12/24 0427) Weight change:  Last BM Date: 06/13/15  Intake/Output from previous day: 12/23 0701 - 12/24 0700 In: 437 [NG/GT:437] Out: -  Last cbgs: CBG (last 3)   Recent Labs  06/15/15 1717 06/15/15 2046 06/16/15 0645  GLUCAP 266* 287* 198*     Physical Exam General: No apparent distress, mildly dyspneic   HEENT: not dry Lungs: Normal effort. Lungs clear to auscultation, no crackles or wheezes. Cardiovascular: Regular rate and rhythm, no edema Abdomen: S/NT/ND; BS(+) Musculoskeletal:  unchanged Neurological: No new neurological deficits Wounds: N/A    Skin: clear  Aging changes Mental state: Alert, aphasic, cooperative    Lab Results: BMET    Component Value Date/Time   NA 138 06/14/2015 0555   K 3.8 06/14/2015 0555   CL 98* 06/14/2015 0555   CO2 30 06/14/2015 0555   GLUCOSE 191* 06/14/2015 0555   BUN 7 06/14/2015 0555   CREATININE 0.32* 06/14/2015 0555   CALCIUM 9.1 06/14/2015 0555   GFRNONAA >60 06/14/2015 0555   GFRAA >60 06/14/2015 0555   CBC    Component Value Date/Time   WBC 6.9 06/14/2015 0555   WBC 10.6* 12/17/2011 1528   RBC 4.02 06/14/2015 0555   RBC 5.02 12/17/2011 1528   HGB 11.2* 06/14/2015 0555   HGB 14.6 12/17/2011 1528   HCT 36.3 06/14/2015 0555   HCT 43.2 12/17/2011 1528   PLT 195 06/14/2015 0555   PLT 239 12/17/2011 1528   MCV 90.3 06/14/2015 0555   MCV 86.0 12/17/2011 1528   MCH 27.9 06/14/2015 0555   MCH 29.0 12/17/2011 1528   MCHC 30.9 06/14/2015 0555   MCHC 33.7 12/17/2011 1528   RDW 14.7 06/14/2015 0555   RDW 13.8 12/17/2011 1528    LYMPHSABS 2.3 06/14/2015 0555   LYMPHSABS 3.0 12/17/2011 1528   MONOABS 0.4 06/14/2015 0555   MONOABS 0.5 12/17/2011 1528   EOSABS 0.1 06/14/2015 0555   EOSABS 0.4 12/17/2011 1528   BASOSABS 0.0 06/14/2015 0555   BASOSABS 0.1 12/17/2011 1528    Studies/Results: Dg Swallowing Func-speech Pathology  06/15/2015  Objective Swallowing Evaluation:   Patient Details Name: Kerry [name] MRN: [id number] Date of Birth: [birth year] Today's Date: 06/15/2015 Time: SLP Start Time: 0855-SLP Stop Time : 0922 SLP Time Calculation (min) : 27 min Past Medical History: Past Medical History Diagnosis Date . Hypertension  . Coronary artery disease  . Diabetes mellitus  . Breast cancer ([place]) 03/10/2007   Left breat . Degenerative disc disease, cervical  . Degenerative disc disease, lumbar  . Fibromyalgia  . Hyperlipidemia  . Carpal tunnel syndrome on right  . PE (pulmonary embolism)  . UTI (lower urinary tract infection)  . GIB (gastrointestinal bleeding)  . Elevated troponin  . HTN (hypertension) 06/13/2015 Past Surgical History: Past Surgical History Procedure Laterality Date . Cardiac surgery   . Appendectomy   . Breast surgery   . Mastectomy Left 03/2007 . Cardiac catheterization  2005 . Vein surgery Right 2005 . Tubal ligation Right  . Radiology with anesthesia N/A 04/11/2015   Procedure: RADIOLOGY WITH ANESTHESIA;  Surgeon: Luanne Bras, MD;  Location: [place];  Service: Radiology;  Laterality: N/A; . Cardiac catheterization N/A 04/13/2015   Procedure: Temporary Pacemaker;  Surgeon: Peter M Martinique, MD;  Location: [place] [facility];  Service: Cardiovascular;  Laterality: N/A; . Peg placement N/A 04/18/2015   Procedure: PERCUTANEOUS ENDOSCOPIC GASTROSTOMY (PEG) PLACEMENT;  Surgeon: Judeth Horn, MD;  Location: [place];  Service: General;  Laterality: N/A;  bedside . Esophagogastroduodenoscopy N/A 04/18/2015   Procedure: ESOPHAGOGASTRODUODENOSCOPY (EGD);  Surgeon: Judeth Horn, MD;  Location: [facility];   Service: General;  Laterality: N/A; HPI: pt presents with dense L MCA Infarct involving L Temporal, Frontoparietal, and L Basal Ganglia regions and was started on TPA, but unable to complete full dose due to hypertension, but did recieve IR intervention. Pt extubated 10/30. pt with hx of DM, HTN, Breast CA s/p Mastectomy, and Fibromyalgia. Patient transferred to [facility] and then Inpatient Rehab. Transferred back to acute on 06/07/15 due to PE. Readmitted to CIR on 06/13/2015 for completion of family education prior to discharge home.  Assessment / Plan / Recommendation CHL IP CLINICAL IMPRESSIONS 06/15/2015 Therapy Diagnosis Mild oral phase dysphagia;Moderate pharyngeal phase dysphagia Clinical Impression Pt presents with slight decline in swallowing function in comparison to previous MBS, suspect due to deconditioning s/p acute treatment for PE.  Overall pt remains with a mild oral phase dysphagia and moderate pharyngeal phase dysphagia with both sensory and motor components.  Oral phase deficits again are characterized by right sided labial and lingual weakness which results in weakened manipulation and prolonged oral transit of boluses to the oropharynx.  Decreased pharyngeal sensation and decreased base of tongue retraction also resulted in premature spillage of materials into the pharynx and delayed swallow initiation.  Swallow response was delayed to the vallecula with thickened liquids and solids, and further delayed to the pyriforms with thin liquids.  Due to delayed swallow initiation, pt exhibited deep, silent penetration of nectar thick liquids on initial cup sip and again when challenged with larger boluses; however, smaller boluses were tolerated without airway compromise.  Post swallow, moderate vallecular residuals were noted with thickened liquids and solids and mild-moderate pyriform residue with thin liquids due to pharyngeal weakness.  Consistent deep penetration during the  swallow and eventual unsensed aspiration of residuals after the swallow was visualized with thin liquids, even when boluses were strictly controlled via teaspoon.  Suspect increase in frequency and severity of airway compromise in comparison to previous study to be related to increased respiratory rate and effort required for breathing.  Pt continues to spontaneously use extra swallows which, in combination with SLP interventions for alternating solids and liquids effectively minimized pharyngeal residue.  Recommend that pt remain on dys 2 textures and nectar thick liquids with full supervision for use of swallowing precautions.  Do not recommend continuing with water protocol at this time due to pt's likely diminished functional reserve.  Prognosis for advancement fair with trials of advanced consistencies, SLP follow up for use of safe swallowing strategies, and repeat objective swallow study.   Impact on safety and function Moderate aspiration risk   CHL IP TREATMENT RECOMMENDATION 06/12/2015 Treatment Recommendations Therapy as outlined in treatment plan below   Prognosis 06/15/2015 Prognosis for Safe Diet Advancement Fair Barriers to Reach Goals Cognitive deficits;Language deficits;Motivation Barriers/Prognosis Comment -- CHL IP DIET RECOMMENDATION 06/15/2015 SLP Diet Recommendations Dysphagia 2 (Fine chop) solids;Nectar thick liquid Liquid Administration via Cup Medication Administration Other (Comment) Compensations Slow rate;Small sips/bites;Minimize environmental distractions;Multiple dry swallows after each bite/sip;Follow solids with liquid Postural  Changes Seated upright at 90 degrees            CHL IP ORAL PHASE 06/15/2015 Oral Phase Impaired Oral - Pudding Teaspoon -- Oral - Pudding Cup -- Oral - Honey Teaspoon -- Oral - Honey Cup -- Oral - Nectar Teaspoon -- Oral - Nectar Cup Reduced posterior propulsion;Lingual/palatal residue;Piecemeal swallowing;Decreased bolus cohesion;Delayed oral  transit;Premature spillage Oral - Nectar Straw -- Oral - Thin Teaspoon -- Oral - Thin Cup Weak lingual manipulation;Reduced posterior propulsion;Lingual/palatal residue;Piecemeal swallowing;Delayed oral transit;Decreased bolus cohesion;Premature spillage Oral - Thin Straw -- Oral - Puree Weak lingual manipulation;Reduced posterior propulsion;Lingual/palatal residue;Premature spillage;Delayed oral transit Oral - Mech Soft -- Oral - Regular Weak lingual manipulation;Reduced posterior propulsion;Lingual/palatal residue;Piecemeal swallowing;Impaired mastication;Delayed oral transit;Decreased bolus cohesion;Premature spillage Oral - Multi-Consistency -- Oral - Pill -- Oral Phase - Comment --  CHL IP PHARYNGEAL PHASE 06/15/2015 Pharyngeal Phase -- Pharyngeal- Pudding Teaspoon -- Pharyngeal -- Pharyngeal- Pudding Cup -- Pharyngeal -- Pharyngeal- Honey Teaspoon -- Pharyngeal -- Pharyngeal- Honey Cup -- Pharyngeal -- Pharyngeal- Nectar Teaspoon -- Pharyngeal -- Pharyngeal- Nectar Cup Delayed swallow initiation-vallecula;Delayed swallow initiation-pyriform sinuses;Reduced laryngeal elevation;Reduced airway/laryngeal closure;Reduced tongue base retraction;Penetration/Aspiration during swallow;Pharyngeal residue - valleculae Pharyngeal Material enters airway, CONTACTS cords and not ejected out Pharyngeal- Nectar Straw -- Pharyngeal -- Pharyngeal- Thin Teaspoon -- Pharyngeal -- Pharyngeal- Thin Cup Reduced airway/laryngeal closure;Reduced laryngeal elevation;Delayed swallow initiation-pyriform sinuses;Reduced tongue base retraction;Penetration/Aspiration during swallow;Penetration/Apiration after swallow;Pharyngeal residue - pyriform;Pharyngeal residue - valleculae Pharyngeal Material enters airway, passes BELOW cords without attempt by patient to eject out (silent aspiration) Pharyngeal- Thin Straw -- Pharyngeal -- Pharyngeal- Puree Delayed swallow initiation-vallecula;Pharyngeal residue - valleculae;Reduced laryngeal  elevation;Reduced airway/laryngeal closure;Reduced tongue base retraction Pharyngeal -- Pharyngeal- Mechanical Soft -- Pharyngeal -- Pharyngeal- Regular Delayed swallow initiation-vallecula;Reduced tongue base retraction;Pharyngeal residue - valleculae;Reduced laryngeal elevation Pharyngeal -- Pharyngeal- Multi-consistency -- Pharyngeal -- Pharyngeal- Pill -- Pharyngeal -- Pharyngeal Comment --  No flowsheet data found. Page, Selinda Orion 06/15/2015, 12:30 PM               Medications: I have reviewed the patient's current medications.  Assessment/Plan:  1. 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  Continue CIR 2. PE/RLL DVT /Anticoagulation: Pharmaceutical: Xarelto Spoke to therapies regarding spot checks of O2 sats  3. Pain Management: tylenol prn effective for chronic pain.  4. Mood: Has lot of underlying psycho-social issues but patient lacks awareness/insight at this time. Pleasant and smiling most of the time. Team and LCSW to work with family to educate on monitoring mood and help facilitate discharge.  5. Neuropsych: This patient is not capable of making decisions on her own behalf. 6. Skin/Wound Care: Maintain adequate nutrition and hydration status. Continue tube feeds. Routine pressure relief measures.  7. Fluids/Electrolytes/Nutrition: Continue tube feeds tid due to  8. HTN: Blood pressures trending upwards again.  Continue Prinivil Coreg increased to 25 on 12/23  9. DM type 2: Monitor BS ac/hs. Use SSI for elevated BS.  Levemir increased to 9U on 12/24 Will cont to monitor 10. Sepsis/SIRS: Was treated for E coli UTI prior to discharge on 12/15. Blood cultures X 2 negative. Patient did not have fevers and elevated WBC likely reactive due to DVT/PE. Has completed 5 days of IV antibiotics and 24 hours of oral antibiotic--continued for an additional day  and will d/cto avoid side effects/complications.  11. Recent GIB?: Resume pepcid for now. Will resume PPI once antibiotics Completed.  12. Hypoalbuminemia: protein supplementation started 12/22 13. Anemia Hb 11.2 on  12/22 Will cont to monitor    Length of stay, days: 3  Walker Kehr , MD 06/16/2015, 1:40 PM

## 2015-06-16 NOTE — Progress Notes (Signed)
Physical Therapy Session Note  Patient Details  Name: Kerry [name] MRN: [id number] Date of Birth: 10/21/1955  Today's Date: 06/16/2015 PT Individual Time: 0930-1040 PT Individual Time Calculation (min): 70 min   Short Term Goals: Week 1:  PT Short Term Goal 1 (Week 1): =LTG due to estimated length of stay  Skilled Therapeutic Interventions/Progress Updates:    Pt received in w/c and agreeable to therapy session.  Session focus on R NMR, w/c propulsion, and functional mobility.  PT propelled pt to therapy gym and pt performed R NMR on Kinetron.  Initial forced use of RLE on kinetron with therapist providing total facilitation, progress to pt using BLEs on kinetron 3x10 reps with min facilitation at RLE.  PT instructed patient in w/c propulsion using L hemi-technique with mod assist and verbal/visual cues for use of LLE to steer w/c.  Pt returned to room total assist to change wet pants. +2 squat pivot transfer from w/c>EOB and max assist for sit>supine.  Pt able to roll R and L with min assist multiple trials for LB undressing/dressing.  Pt positioned to comfort at end of session with call bell in reach and needs met.   Therapy Documentation Precautions:  Precautions Precautions: Fall Precaution Comments: Peg, Rt shoulder subluxation Required Braces or Orthoses: Other Brace/Splint Other Brace/Splint: R foot PRAFO Restrictions Weight Bearing Restrictions: No Vital Signs: 88/48 sitting at start of session 111/66 sitting after transport to gym 104/52 sitting after 2 min on Kinetron  140/59 sitting after 5 min on Kinetron Pain: Pain Assessment Pain Assessment: Faces Faces Pain Scale: No hurt   See Function Navigator for Current Functional Status.   Therapy/Group: Individual Therapy  Caitlin E Penven-Crew 06/16/2015, 12:27 PM  

## 2015-06-17 LAB — URINALYSIS W MICROSCOPIC (NOT AT ARMC)
BILIRUBIN URINE: NEGATIVE
GLUCOSE, UA: 250 mg/dL — AB
KETONES UR: NEGATIVE mg/dL
NITRITE: NEGATIVE
PH: 5 (ref 5.0–8.0)
Protein, ur: NEGATIVE mg/dL
Specific Gravity, Urine: 1.028 (ref 1.005–1.030)

## 2015-06-17 LAB — CBC
HCT: 40.8 % (ref 36.0–46.0)
Hemoglobin: 13.1 g/dL (ref 12.0–15.0)
MCH: 29.4 pg (ref 26.0–34.0)
MCHC: 32.1 g/dL (ref 30.0–36.0)
MCV: 91.5 fL (ref 78.0–100.0)
PLATELETS: 193 10*3/uL (ref 150–400)
RBC: 4.46 MIL/uL (ref 3.87–5.11)
RDW: 15.2 % (ref 11.5–15.5)
WBC: 12.3 10*3/uL — AB (ref 4.0–10.5)

## 2015-06-17 LAB — GLUCOSE, CAPILLARY
GLUCOSE-CAPILLARY: 196 mg/dL — AB (ref 65–99)
GLUCOSE-CAPILLARY: 255 mg/dL — AB (ref 65–99)
Glucose-Capillary: 221 mg/dL — ABNORMAL HIGH (ref 65–99)
Glucose-Capillary: 208 mg/dL — ABNORMAL HIGH (ref 65–99)

## 2015-06-17 NOTE — Progress Notes (Signed)
06/17/15 1529 nursing Patient was incontinent PVR 25 unable to get U/A. RN to report to incoming shift.

## 2015-06-17 NOTE — Progress Notes (Signed)
06/17/15 1638 nursing Breathing less labored ; patient resting not in any form of distress. Continued to monitor.

## 2015-06-17 NOTE — Progress Notes (Signed)
06/17/15 0854 nursing  Noted labored breathing  Vital signs 98.1 100/52 100% with 2-3 liters per [place] HR 101; Patient alert and oriented  No pain noted. MD notified no new orders claims she was the same as yesterday lab works, chest xray done with negative results. Continued to monitor.

## 2015-06-17 NOTE — Plan of Care (Signed)
Problem: RH BOWEL ELIMINATION Goal: RH STG MANAGE BOWEL WITH ASSISTANCE STG Manage Bowel with Mod I Assistance.  Outcome: Not Progressing LBM 12/21; offer laxatives  Problem: RH BLADDER ELIMINATION Goal: RH STG MANAGE BLADDER WITH ASSISTANCE STG Manage Bladder With Mod I Assistance  Outcome: Not Progressing Patient is incontinent needing PVR's

## 2015-06-17 NOTE — Progress Notes (Signed)
Kerry [name] is a 59 y.o. female 03-26-1956 [id number]  Subjective: No new complaints. No new problems: dyspnea is unchanged - not new. Slept well.   Objective: Vital signs in last 24 hours: Temp:  [98.4 F (36.9 C)] 98.4 F (36.9 C) (12/25 0517) Pulse Rate:  [82-91] 82 (12/25 0517) Resp:  [20-33] 20 (12/25 0517) BP: (105-135)/(52-62) 116/62 mmHg (12/25 0517) SpO2:  [99 %-100 %] 100 % (12/25 0517) Weight change:  Last BM Date: 06/13/15  Intake/Output from previous day: 12/24 0701 - 12/25 0700 In: -  Out: 150 [Urine:150] Last cbgs: CBG (last 3)   Recent Labs  06/16/15 1641 06/16/15 2052 06/17/15 0659  GLUCAP 222* 228* 196*     Physical Exam General: No apparent distress, mildly dyspneic   HEENT: not dry Lungs: Normal effort. Lungs clear to auscultation, no crackles or wheezes. Cardiovascular: Regular rate and rhythm, no edema Abdomen: S/NT/ND; BS(+) Musculoskeletal:  unchanged Neurological: No new neurological deficits   Skin: clear  Aging changes Mental state: Alert, aphasic, cooperative O2 is on - O2 sats are good    Lab Results: BMET    Component Value Date/Time   NA 139 06/16/2015 1708   K 4.5 06/16/2015 1708   CL 97* 06/16/2015 1708   CO2 34* 06/16/2015 1708   GLUCOSE 231* 06/16/2015 1708   BUN 28* 06/16/2015 1708   CREATININE 0.48 06/16/2015 1708   CALCIUM 9.6 06/16/2015 1708   GFRNONAA >60 06/16/2015 1708   GFRAA >60 06/16/2015 1708   CBC    Component Value Date/Time   WBC 12.3* 06/17/2015 0513   WBC 10.6* 12/17/2011 1528   RBC 4.46 06/17/2015 0513   RBC 5.02 12/17/2011 1528   HGB 13.1 06/17/2015 0513   HGB 14.6 12/17/2011 1528   HCT 40.8 06/17/2015 0513   HCT 43.2 12/17/2011 1528   PLT 193 06/17/2015 0513   PLT 239 12/17/2011 1528   MCV 91.5 06/17/2015 0513   MCV 86.0 12/17/2011 1528   MCH 29.4 06/17/2015 0513   MCH 29.0 12/17/2011 1528   MCHC 32.1 06/17/2015 0513   MCHC 33.7 12/17/2011 1528   RDW 15.2 06/17/2015 0513   RDW 13.8 12/17/2011 1528   LYMPHSABS 2.3 06/14/2015 0555   LYMPHSABS 3.0 12/17/2011 1528   MONOABS 0.4 06/14/2015 0555   MONOABS 0.5 12/17/2011 1528   EOSABS 0.1 06/14/2015 0555   EOSABS 0.4 12/17/2011 1528   BASOSABS 0.0 06/14/2015 0555   BASOSABS 0.1 12/17/2011 1528    Studies/Results: Dg Chest 2 View  06/16/2015  CLINICAL DATA:  Breathing difficulty. EXAM: CHEST  2 VIEW COMPARISON:  06/05/2015 FINDINGS: Moderate cardiac enlargement. There is no pleural effusion or edema. The lungs appear clear. Spondylosis noted within the thoracic spine. IMPRESSION: 1. Cardiac enlargement. Electronically Signed   By: Kerby Moors M.D.   On: 06/16/2015 17:35    Medications: I have reviewed the patient's current medications.  Assessment/Plan:  1. 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  Continue CIR 2. PE/RLL DVT /Anticoagulation: Pharmaceutical: Xarelto Spoke to therapies regarding spot checks of O2 sats  3. Pain Management: tylenol prn effective for chronic pain.  4. Mood: Has lot of underlying psycho-social issues but patient lacks awareness/insight at this time. Pleasant and smiling most of the time. Team and LCSW to work with family to educate on monitoring mood and help facilitate discharge.  5. Neuropsych: This patient is not capable of making decisions on her own behalf. 6. Skin/Wound Care: Maintain adequate nutrition  and hydration status. Continue tube feeds. Routine pressure relief measures.  7. Fluids/Electrolytes/Nutrition: Continue tube feeds tid due to  8. HTN: Blood pressures trending upwards again.  Continue Prinivil Coreg increased to 25 on 12/23  9. DM type 2: Monitor BS ac/hs. Use SSI for elevated BS.  Levemir increased to 9U on 12/24 Will cont to monitor 10. Sepsis/SIRS: Was treated for E coli UTI prior to discharge on 12/15. Blood  cultures X 2 negative. Patient did not have fevers and elevated WBC likely reactive due to DVT/PE. Has completed 5 days of IV antibiotics and 24 hours of oral antibiotic--continued for an additional day and will d/cto avoid side effects/complications.  11. Recent GIB?: Resume pepcid for now. Will resume PPI once antibiotics Completed.  12. Hypoalbuminemia: protein supplementation started 12/22 13. Anemia Hb 11.2 on 12/22 Will cont to monitor  14. Elevated WBC - repeat CBC, UA. 15. Dyspnea. Not new. CXR was clear.  Length of stay, days: 4  Walker Kehr , MD 06/17/2015, 10:58 AM

## 2015-06-18 ENCOUNTER — Inpatient Hospital Stay (HOSPITAL_COMMUNITY): Payer: 59

## 2015-06-18 ENCOUNTER — Inpatient Hospital Stay (HOSPITAL_COMMUNITY): Payer: 59 | Admitting: Occupational Therapy

## 2015-06-18 ENCOUNTER — Inpatient Hospital Stay (HOSPITAL_COMMUNITY): Payer: 59 | Admitting: Speech Pathology

## 2015-06-18 DIAGNOSIS — R0689 Other abnormalities of breathing: Secondary | ICD-10-CM

## 2015-06-18 DIAGNOSIS — D72829 Elevated white blood cell count, unspecified: Secondary | ICD-10-CM

## 2015-06-18 LAB — CBC
HCT: 38.1 % (ref 36.0–46.0)
HEMOGLOBIN: 12 g/dL (ref 12.0–15.0)
MCH: 28.6 pg (ref 26.0–34.0)
MCHC: 31.5 g/dL (ref 30.0–36.0)
MCV: 90.9 fL (ref 78.0–100.0)
PLATELETS: 295 10*3/uL (ref 150–400)
RBC: 4.19 MIL/uL (ref 3.87–5.11)
RDW: 15.1 % (ref 11.5–15.5)
WBC: 12.4 10*3/uL — AB (ref 4.0–10.5)

## 2015-06-18 LAB — GLUCOSE, CAPILLARY
GLUCOSE-CAPILLARY: 186 mg/dL — AB (ref 65–99)
GLUCOSE-CAPILLARY: 291 mg/dL — AB (ref 65–99)
GLUCOSE-CAPILLARY: 393 mg/dL — AB (ref 65–99)
Glucose-Capillary: 244 mg/dL — ABNORMAL HIGH (ref 65–99)

## 2015-06-18 NOTE — Progress Notes (Signed)
MD aware of patient's labored breathing, monitor vitals per MD. Incoming rn aware.

## 2015-06-18 NOTE — Progress Notes (Signed)
SLP Cancellation Note  Patient Details Name: Kerry [name] MRN: [id number] DOB: [birth year]   Cancelled treatment:        Pt received in bed and only briefly arousable this AM despite auditory stim and cold compress.  RN informed therapist that pt had received Xanax and Ultram at ~5 AM this morning.  Vital signs were taken, BP 126/59, pulse 83, SPO2 on 2L via nasal cannula 99, respiratory rate 33.  Pt missed 60 minutes of skilled ST due to decreased alertness and inability to participate in therapy.  Will follow up at next available appointment as appropriate.                                                                                                 Keyron Pokorski, Selinda Orion 06/18/2015, 12:36 PM

## 2015-06-18 NOTE — Progress Notes (Signed)
Physical Therapy Session Note  Patient Details  Name: Kerry [name] MRN: [id number] Date of Birth: 29-Jul-1955  Today's Date: 06/18/2015 PT Individual Time: 0910-1005, 1300-1330 PT Individual Time Calculation (min): 55 min , 30 min  Short Term Goals: Week 1:  PT Short Term Goal 1 (Week 1): =LTG due to estimated length of stay        Skilled Therapeutic Interventions/Progress Updates: pt on 2L O2 via [place]. Pt compliant and participated in session to the best of her ability.  TX 1:  son observed and assisted pt with rolling in bed. Bed mobility to assist with donning pants in bed..  Neuromuscular re-education via manual cues, VCs, VCS for midline orientation, sitting balance in sitting with feet supported, L lateral leans and R lateral leans x 5 x 2 EOB.  Transferred to w/c +2, with difficulty.+2 needed to position pt in w/c, tipping pt back in w/c to unweight R hip for ease of movement. PT obtained R 1/2 lap tray for support of R upper quadrant, and towel rolls for R neutral hip alignment, as pt tends to drift into R hip ER.  Will try w/c back support at next session.  PT returned pt to room; family in room; PT recommended pt stay up in w/c for a while for benefits to lungs.  Tx 2:  Pt sound asleep; PT able to arouse her by saying her name, but pt able to open her eyes only briefly.   W/c modifications for fit and function: placement of solid seat insert to position pelvis for neutral hip rotation and decrease posterior pelvic tilt; Drive adjustable back to acomodate thoracic kyphosis and encourage lumbar lordosis. Pt left supine in bed, positioned for comfort. All needs in place and bed alarm set.     Therapy Documentation Precautions:  Precautions Precautions: Fall Precaution Comments: Peg, Rt shoulder subluxation Required Braces or Orthoses: Other Brace/Splint Other Brace/Splint: R foot PRAFO Restrictions Weight Bearing Restrictions: No General: PT Amount of Missed Time  (min): 5 Minutes PT Missed Treatment Reason: Nursing care Pain: Pain Assessment Pain Assessment: No/denies pain    See Function Navigator for Current Functional Status.   Therapy/Group: Individual Therapy  Djuan Talton 06/18/2015, 11:08 AM

## 2015-06-18 NOTE — Progress Notes (Signed)
Occupational Therapy Session Note  Patient Details  Name: Kerry [name] MRN: [id number] Date of Birth: Sep 26, 1955  Today's Date: 06/18/2015 OT Individual Time: 1115-1207 OT Individual Time Calculation (min): 52 min    Short Term Goals: No short term goals set  Skilled Therapeutic Interventions/Progress Updates:    Pt seen with family this session for family education regarding safe transfers. Pt received in w/c with very low arousal level breathing heavily.  Pt on 2L of O2.  Spouse, son and dtr in law present stating that is her normal breathing sounds.  Dtr in law stated she had tried the sliding board in the past but did not feel safe using it. Brought in a slide board to demonstrate safe transfers, but once place under pt. Pt not able to initiate any activity to assist with transfer.  Dtr in law expressing concern on how to transfer her at home if she does not improve. Brought in a hoyer lift.  Family very involved with assisting with lift and spouse maneuvered lift height and positioning.  Pt tolerated lift well and was in no distress. Family all agreed that this would be the safest technique to use at home. Encouraged further family training.  Pt's clothing was wet. With assist from NT and dtr in law, LB clothing changed and pt cleansed. Pt in bed with family in room. All needs met.  Therapy Documentation Precautions:  Precautions Precautions: Fall Precaution Comments: Peg, Rt shoulder subluxation Required Braces or Orthoses: Other Brace/Splint Other Brace/Splint: R foot PRAFO Restrictions Weight Bearing Restrictions: No  Pain: Pain Assessment Pain Assessment: Faces Faces Pain Scale: No hurt ADL:   See Function Navigator for Current Functional Status.   Therapy/Group: Individual Therapy  SAGUIER,JULIA 06/18/2015, 12:52 PM

## 2015-06-18 NOTE — Progress Notes (Signed)
Called by [facility] RN  to  lay a second set of eyes on this pt who seems to be having increased WOB.On arrival pt is sitting up in bed appears to be in mod respiratory distress with increased WOB.  RR 37 Nasal cannula at 2 l with Sats 98%.  Difficult to hear breath sounds due to stridor  from prior trach. No expiratory wheezes auscultated . Pt had received a duoneb at 0400 .  Pt using accessory muscles  with a rather paradoxical breathing pattern. Pt is aphasic but motions that her  breathing is a little more difficult than usual.    Per staff pt's stridor is not new  But her increased RR is new as well as  her use of accessory muscles and paradoxical breathing  Pattern.  VS:  167/80  HR 90   Oral Temp 97.9  Stat PCXR ordered  Shows mild bibasilar atelectasis.  No pleural effusion or pneumothorax.  + Cardiomegaly. Recommend give pt ultran and Xanax per order.  Consult pt's physician if no improvement after meds given.  Will follow as needed.  Hand off to Cyr, [employer]

## 2015-06-18 NOTE — Progress Notes (Signed)
[place] [facility]     PROGRESS NOTE  Subjective/Complaints:  Patient seen this AM in bed. Per nursing, patient with labored breathing, however this may be her new baseline. She is less alert this morning, but was recently given Xanax.   ROS: Limited due to language and cognition.   Objective: Vital Signs: Blood pressure 128/63, pulse 83, temperature 97.9 F (36.6 C), temperature source Oral, resp. rate 33, height 5\' 10"  (1.778 m), weight 76.5 kg (168 lb 10.4 oz), SpO2 99 %. Dg Chest 2 View  06/16/2015  CLINICAL DATA:  Breathing difficulty. EXAM: CHEST  2 VIEW COMPARISON:  06/05/2015 FINDINGS: Moderate cardiac enlargement. There is no pleural effusion or edema. The lungs appear clear. Spondylosis noted within the thoracic spine. IMPRESSION: 1. Cardiac enlargement. Electronically Signed   By: Kerby Moors M.D.   On: 06/16/2015 17:35   Dg Chest Port 1 View  06/18/2015  CLINICAL DATA:  Acute onset of shortness of breath on exertion. Initial encounter. EXAM: PORTABLE CHEST 1 VIEW COMPARISON:  Chest radiograph performed 06/16/2015 FINDINGS: The lungs are well-aerated. Mild bibasilar atelectasis is noted. There is no evidence of pleural effusion or pneumothorax. The cardiomediastinal silhouette is enlarged. No acute osseous abnormalities are seen. IMPRESSION: Mild bibasilar atelectasis noted.  Cardiomegaly noted. Electronically Signed   By: Garald Balding M.D.   On: 06/18/2015 04:44    Recent Labs  06/17/15 0513 06/18/15 0650  WBC 12.3* 12.4*  HGB 13.1 12.0  HCT 40.8 38.1  PLT 193 295    Recent Labs  06/16/15 1708  NA 139  K 4.5  CL 97*  GLUCOSE 231*  BUN 28*  CREATININE 0.48  CALCIUM 9.6   CBG (last 3)   Recent Labs  06/17/15 1622 06/17/15 2211 06/18/15 0637  GLUCAP 208* 255* 186*    Wt Readings from Last 3 Encounters:  06/14/15 76.5 kg (168 lb 10.4 oz)  06/08/15 80.1 kg (176 lb 9.4 oz)  06/06/15 79.107 kg (174 lb 6.4 oz)    Physical  Exam:  BP 128/63 mmHg  Pulse 83  Temp(Src) 97.9 F (36.6 C) (Oral)  Resp 33  Ht 5\' 10"  (1.778 m)  Wt 76.5 kg (168 lb 10.4 oz)  BMI 24.20 kg/m2  SpO2 99% Constitutional: She appears well-developed and well-nourished. Vital signs reviewed.  HENT: Normocephalic and atraumatic. Poor dentition. Conjunctivae and EOM are normal. Neck: Normal range of motion. Neck supple.  Prior tracheostomy stoma well healed.  Cardiovascular: Normal rate and regular rhythm. No murmur heard. Respiratory: Effort normal and breath sounds normal. No respiratory distress. She exhibits no tenderness.  GI: Bowel sounds are normal. She exhibits no distension. There is no tenderness.  Musculoskeletal:  Min edema RUE/RLE  Neurological: She is somnolent.  Fluent >nonfluent aphasia  Continues to make volitional stridorous sounds.  Unable to participate in MMT this AM due to somnolence  Skin: Skin is warm and dry.  Psychiatric: Her affect is inappropriate. Her speech is delayed. Cognition and memory are impaired.   Assessment/Plan: 1. Functional deficits secondary to left MCA infarct and PE which require 3+ hours per day of interdisciplinary therapy in a comprehensive inpatient rehab setting. Physiatrist is providing close team supervision and 24 hour management of active medical problems listed below. Physiatrist and rehab team continue to assess barriers to discharge/monitor patient progress toward functional and medical goals.  Function:  Bathing Bathing position   Position: Bed  Bathing parts Body parts bathed by patient: Chest, Abdomen, Front perineal area, Right  upper leg, Left upper leg Body parts bathed by helper: Right arm, Left arm, Buttocks, Right lower leg, Left lower leg  Bathing assist Assist Level:  (Mod assist)      Upper Body Dressing/Undressing Upper body dressing   What is the patient wearing?: Pull over shirt/dress     Pull over shirt/dress - Perfomed by patient: Thread/unthread left  sleeve Pull over shirt/dress - Perfomed by helper: Pull shirt over trunk, Thread/unthread right sleeve, Put head through opening        Upper body assist Assist Level:  (Mod assist)      Lower Body Dressing/Undressing Lower body dressing Lower body dressing/undressing activity did not occur: Refused What is the patient wearing?: Pants       Pants- Performed by helper: Thread/unthread right pants leg, Thread/unthread left pants leg, Pull pants up/down   Non-skid slipper socks- Performed by helper: Don/doff right sock, Don/doff left sock                  Lower body assist Assist for lower body dressing:  (Total assist)      Toileting Toileting          Toileting assist     Transfers Chair/bed transfer Chair/bed transfer activity did not occur: Refused Chair/bed transfer method: Squat pivot Chair/bed transfer assist level: 2 helpers Chair/bed transfer assistive device: Bedrails, Armrests     Locomotion Ambulation Ambulation activity did not occur: Scientist, research (physical sciences) activity did not occur: Refused Type: Manual Max wheelchair distance: 60 Assist Level: Moderate assistance (Pt 50 - 74%)  Cognition Comprehension Comprehension assist level: Understands complex 90% of the time/cues 10% of the time  Expression Expression assist level: Expresses basic 25 - 49% of the time/requires cueing 50 - 75% of the time. Uses single words/gestures., Expresses basic 50 - 74% of the time/requires cueing 25 - 49% of the time. Needs to repeat parts of sentences.  Social Interaction Social Interaction assist level: Interacts appropriately with others - No medications needed.  Problem Solving Problem solving assist level: Solves basic 25 - 49% of the time - needs direction more than half the time to initiate, plan or complete simple activities  Memory Memory assist level: Recognizes or recalls 25 - 49% of the time/requires cueing 50 - 75% of the time    Medical  Problem List and Plan 1. 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   Continue CIR  This is likely due to recent sedative medication. Frequent vital checks for now.  Patient resting comfortably on reevaluation 2. PE/RLL DVT /Anticoagulation: Pharmaceutical: Xarelto  Spoke to therapies regarding spot checks of O2 sats   Baseline tachypnea 3. Pain Management: tylenol prn effective for chronic pain.  4. Mood: Has lot of underlying psycho-social issues but patient lacks awareness/insight at this time. Pleasant and smiling most of the time. Team and LCSW to work with family to educate on monitoring mood and help facilitate discharge.  5. Neuropsych: This patient is not capable of making decisions on her own behalf. 6. Skin/Wound Care: Maintain adequate nutrition and hydration status. Continue tube feeds. Routine pressure relief measures.  7. Fluids/Electrolytes/Nutrition: Continue tube feeds tid due to  8. HTN: Blood pressures trending upwards again.   Continue Prinivil  Coreg increased to 25 on 12/23  9. DM type 2: Monitor BS ac/hs. Use SSI for elevated BS.   Levemir increased to 9U on 12/24, will not increase today  due to recent increase  Fasting CBG 186 this AM  Will cont to monitor 10. Sepsis/SIRS: Was treated for E coli UTI prior to discharge on 12/15. Blood cultures X 2 negative. Patient did not have fevers and elevated WBC likely reactive due to DVT/PE. Has completed 5 days of IV antibiotics and 24 hours of oral antibiotic--continued for an additional day and will d/cto avoid side effects/complications.  11. Recent GIB?: Resume pepcid for now. Will resume PPI once antibiotics Completed.  12. Hypoalbuminemia: protein supplementation started 12/22 13. Anemia  Hb 11.2 on 12/22  Will cont to monitor 14. Leukocytosis  CXR relatively unremarkable  Recent UA with trace LLE, however due to ?change in alertness will order Ucx  LOS  (Days) 5 A FACE TO FACE EVALUATION WAS PERFORMED  [name] 06/18/2015 9:04 AM

## 2015-06-18 NOTE — Progress Notes (Signed)
Patient went back to sleep. Respiration now at 24. Will continue to monitor.

## 2015-06-18 NOTE — Progress Notes (Signed)
Social Work Patient ID: Kerry [name], female   DOB: [birth year], 59 y.o.   MRN: [id number] Sent information to jill [facility] Case manager she requested due to pt on CIR and she will be following her here. Update her as requested.

## 2015-06-18 NOTE — Progress Notes (Addendum)
Patient with dyspnea, respiration 37. Oxygen at 2L via nasal cannula with saturation at 98%.see flowsheet for vitals. Respiratory therapist called. Duoneb given by RN. Patient slept ok but labored breathing still remained the same. Xanax and ultram given at 2209. Rapid response initiated to reassess patient. U/A sent prior, see result.  Ultram, xanax given again at 5am. Will monitor.

## 2015-06-19 ENCOUNTER — Inpatient Hospital Stay (HOSPITAL_COMMUNITY): Payer: BLUE CROSS/BLUE SHIELD

## 2015-06-19 ENCOUNTER — Inpatient Hospital Stay (HOSPITAL_COMMUNITY): Payer: 59 | Admitting: Occupational Therapy

## 2015-06-19 ENCOUNTER — Inpatient Hospital Stay (HOSPITAL_COMMUNITY): Payer: BLUE CROSS/BLUE SHIELD | Admitting: Occupational Therapy

## 2015-06-19 ENCOUNTER — Encounter (HOSPITAL_COMMUNITY): Payer: Self-pay | Admitting: Physician Assistant

## 2015-06-19 ENCOUNTER — Inpatient Hospital Stay (HOSPITAL_COMMUNITY)
Admission: EM | Admit: 2015-06-19 | Discharge: 2015-07-09 | DRG: 004 | Disposition: A | Discharge status: Home Health | Payer: BLUE CROSS/BLUE SHIELD | Source: Ambulatory Visit | Attending: Internal Medicine | Admitting: Internal Medicine

## 2015-06-19 ENCOUNTER — Inpatient Hospital Stay (HOSPITAL_COMMUNITY): Payer: 59

## 2015-06-19 ENCOUNTER — Inpatient Hospital Stay (HOSPITAL_COMMUNITY): Payer: BLUE CROSS/BLUE SHIELD | Admitting: Speech Pathology

## 2015-06-19 ENCOUNTER — Inpatient Hospital Stay: Admit: 2015-06-19 | Payer: Self-pay | Admitting: Critical Care Medicine

## 2015-06-19 ENCOUNTER — Inpatient Hospital Stay (HOSPITAL_COMMUNITY)
Admission: EM | Admit: 2015-06-19 | Discharge: 2015-06-19 | Discharge status: Absent without leave | Payer: BLUE CROSS/BLUE SHIELD | Source: Ambulatory Visit | Attending: Physical Medicine & Rehabilitation | Admitting: Physical Medicine & Rehabilitation

## 2015-06-19 DIAGNOSIS — Z853 Personal history of malignant neoplasm of breast: Secondary | ICD-10-CM

## 2015-06-19 DIAGNOSIS — I82402 Acute embolism and thrombosis of unspecified deep veins of left lower extremity: Secondary | ICD-10-CM | POA: Diagnosis not present

## 2015-06-19 DIAGNOSIS — I6300 Cerebral infarction due to thrombosis of unspecified precerebral artery: Secondary | ICD-10-CM | POA: Diagnosis not present

## 2015-06-19 DIAGNOSIS — Y95 Nosocomial condition: Secondary | ICD-10-CM | POA: Diagnosis present

## 2015-06-19 DIAGNOSIS — N390 Urinary tract infection, site not specified: Secondary | ICD-10-CM | POA: Diagnosis present

## 2015-06-19 DIAGNOSIS — I82411 Acute embolism and thrombosis of right femoral vein: Secondary | ICD-10-CM | POA: Diagnosis present

## 2015-06-19 DIAGNOSIS — F17200 Nicotine dependence, unspecified, uncomplicated: Secondary | ICD-10-CM | POA: Diagnosis present

## 2015-06-19 DIAGNOSIS — J9601 Acute respiratory failure with hypoxia: Secondary | ICD-10-CM | POA: Diagnosis present

## 2015-06-19 DIAGNOSIS — Z8673 Personal history of transient ischemic attack (TIA), and cerebral infarction without residual deficits: Secondary | ICD-10-CM | POA: Diagnosis not present

## 2015-06-19 DIAGNOSIS — Z794 Long term (current) use of insulin: Secondary | ICD-10-CM

## 2015-06-19 DIAGNOSIS — Z978 Presence of other specified devices: Secondary | ICD-10-CM

## 2015-06-19 DIAGNOSIS — I2699 Other pulmonary embolism without acute cor pulmonale: Secondary | ICD-10-CM | POA: Diagnosis not present

## 2015-06-19 DIAGNOSIS — R402433 Glasgow coma scale score 3-8, at hospital admission: Secondary | ICD-10-CM | POA: Diagnosis present

## 2015-06-19 DIAGNOSIS — E876 Hypokalemia: Secondary | ICD-10-CM | POA: Diagnosis not present

## 2015-06-19 DIAGNOSIS — D6489 Other specified anemias: Secondary | ICD-10-CM | POA: Diagnosis not present

## 2015-06-19 DIAGNOSIS — J9622 Acute and chronic respiratory failure with hypercapnia: Secondary | ICD-10-CM | POA: Diagnosis present

## 2015-06-19 DIAGNOSIS — I10 Essential (primary) hypertension: Secondary | ICD-10-CM | POA: Diagnosis present

## 2015-06-19 DIAGNOSIS — E785 Hyperlipidemia, unspecified: Secondary | ICD-10-CM | POA: Diagnosis present

## 2015-06-19 DIAGNOSIS — J189 Pneumonia, unspecified organism: Secondary | ICD-10-CM | POA: Diagnosis present

## 2015-06-19 DIAGNOSIS — Z7901 Long term (current) use of anticoagulants: Secondary | ICD-10-CM

## 2015-06-19 DIAGNOSIS — R001 Bradycardia, unspecified: Secondary | ICD-10-CM | POA: Diagnosis not present

## 2015-06-19 DIAGNOSIS — J9620 Acute and chronic respiratory failure, unspecified whether with hypoxia or hypercapnia: Secondary | ICD-10-CM | POA: Diagnosis not present

## 2015-06-19 DIAGNOSIS — I639 Cerebral infarction, unspecified: Secondary | ICD-10-CM

## 2015-06-19 DIAGNOSIS — I2510 Atherosclerotic heart disease of native coronary artery without angina pectoris: Secondary | ICD-10-CM | POA: Diagnosis present

## 2015-06-19 DIAGNOSIS — J15211 Pneumonia due to Methicillin susceptible Staphylococcus aureus: Secondary | ICD-10-CM | POA: Diagnosis present

## 2015-06-19 DIAGNOSIS — Z86711 Personal history of pulmonary embolism: Secondary | ICD-10-CM | POA: Diagnosis not present

## 2015-06-19 DIAGNOSIS — B9620 Unspecified Escherichia coli [E. coli] as the cause of diseases classified elsewhere: Secondary | ICD-10-CM | POA: Diagnosis present

## 2015-06-19 DIAGNOSIS — J9602 Acute respiratory failure with hypercapnia: Secondary | ICD-10-CM | POA: Diagnosis not present

## 2015-06-19 DIAGNOSIS — T17998D Other foreign object in respiratory tract, part unspecified causing other injury, subsequent encounter: Secondary | ICD-10-CM | POA: Diagnosis not present

## 2015-06-19 DIAGNOSIS — J3800 Paralysis of vocal cords and larynx, unspecified: Secondary | ICD-10-CM | POA: Diagnosis present

## 2015-06-19 DIAGNOSIS — I69351 Hemiplegia and hemiparesis following cerebral infarction affecting right dominant side: Secondary | ICD-10-CM

## 2015-06-19 DIAGNOSIS — E875 Hyperkalemia: Secondary | ICD-10-CM | POA: Diagnosis present

## 2015-06-19 DIAGNOSIS — L8990 Pressure ulcer of unspecified site, unspecified stage: Secondary | ICD-10-CM | POA: Insufficient documentation

## 2015-06-19 DIAGNOSIS — R4182 Altered mental status, unspecified: Secondary | ICD-10-CM

## 2015-06-19 DIAGNOSIS — E872 Acidosis: Secondary | ICD-10-CM | POA: Diagnosis present

## 2015-06-19 DIAGNOSIS — J690 Pneumonitis due to inhalation of food and vomit: Secondary | ICD-10-CM | POA: Diagnosis not present

## 2015-06-19 DIAGNOSIS — I69320 Aphasia following cerebral infarction: Secondary | ICD-10-CM

## 2015-06-19 DIAGNOSIS — J9621 Acute and chronic respiratory failure with hypoxia: Secondary | ICD-10-CM | POA: Diagnosis present

## 2015-06-19 DIAGNOSIS — Z931 Gastrostomy status: Secondary | ICD-10-CM

## 2015-06-19 DIAGNOSIS — N342 Other urethritis: Secondary | ICD-10-CM | POA: Diagnosis present

## 2015-06-19 DIAGNOSIS — I959 Hypotension, unspecified: Secondary | ICD-10-CM | POA: Diagnosis present

## 2015-06-19 DIAGNOSIS — Z4659 Encounter for fitting and adjustment of other gastrointestinal appliance and device: Secondary | ICD-10-CM

## 2015-06-19 DIAGNOSIS — I63012 Cerebral infarction due to thrombosis of left vertebral artery: Secondary | ICD-10-CM | POA: Diagnosis not present

## 2015-06-19 DIAGNOSIS — A4101 Sepsis due to Methicillin susceptible Staphylococcus aureus: Secondary | ICD-10-CM | POA: Diagnosis present

## 2015-06-19 DIAGNOSIS — G9340 Encephalopathy, unspecified: Secondary | ICD-10-CM | POA: Diagnosis present

## 2015-06-19 DIAGNOSIS — R400 Somnolence: Secondary | ICD-10-CM | POA: Diagnosis not present

## 2015-06-19 DIAGNOSIS — Z789 Other specified health status: Secondary | ICD-10-CM | POA: Diagnosis not present

## 2015-06-19 DIAGNOSIS — I6350 Cerebral infarction due to unspecified occlusion or stenosis of unspecified cerebral artery: Secondary | ICD-10-CM | POA: Diagnosis present

## 2015-06-19 DIAGNOSIS — I63411 Cerebral infarction due to embolism of right middle cerebral artery: Secondary | ICD-10-CM | POA: Diagnosis present

## 2015-06-19 DIAGNOSIS — R1310 Dysphagia, unspecified: Secondary | ICD-10-CM | POA: Diagnosis present

## 2015-06-19 DIAGNOSIS — R930 Abnormal findings on diagnostic imaging of skull and head, not elsewhere classified: Secondary | ICD-10-CM | POA: Diagnosis not present

## 2015-06-19 DIAGNOSIS — J9690 Respiratory failure, unspecified, unspecified whether with hypoxia or hypercapnia: Secondary | ICD-10-CM

## 2015-06-19 DIAGNOSIS — J9600 Acute respiratory failure, unspecified whether with hypoxia or hypercapnia: Secondary | ICD-10-CM

## 2015-06-19 DIAGNOSIS — R401 Stupor: Secondary | ICD-10-CM | POA: Diagnosis not present

## 2015-06-19 DIAGNOSIS — T17908A Unspecified foreign body in respiratory tract, part unspecified causing other injury, initial encounter: Secondary | ICD-10-CM | POA: Diagnosis present

## 2015-06-19 DIAGNOSIS — R339 Retention of urine, unspecified: Secondary | ICD-10-CM | POA: Diagnosis not present

## 2015-06-19 DIAGNOSIS — T17998S Other foreign object in respiratory tract, part unspecified causing other injury, sequela: Secondary | ICD-10-CM | POA: Diagnosis not present

## 2015-06-19 DIAGNOSIS — M797 Fibromyalgia: Secondary | ICD-10-CM | POA: Diagnosis present

## 2015-06-19 DIAGNOSIS — Z781 Physical restraint status: Secondary | ICD-10-CM

## 2015-06-19 DIAGNOSIS — E1165 Type 2 diabetes mellitus with hyperglycemia: Secondary | ICD-10-CM | POA: Diagnosis present

## 2015-06-19 DIAGNOSIS — Z79899 Other long term (current) drug therapy: Secondary | ICD-10-CM

## 2015-06-19 HISTORY — DX: Stridor: R06.1

## 2015-06-19 LAB — APTT: aPTT: 50 seconds — ABNORMAL HIGH (ref 24–37)

## 2015-06-19 LAB — BLOOD GAS, ARTERIAL
ACID-BASE EXCESS: 6.1 mmol/L — AB (ref 0.0–2.0)
Bicarbonate: 30 mEq/L — ABNORMAL HIGH (ref 20.0–24.0)
DRAWN BY: 42624
FIO2: 0.4
MECHVT: 550 mL
O2 SAT: 99.1 %
PCO2 ART: 43.1 mmHg (ref 35.0–45.0)
PEEP/CPAP: 5 cmH2O
PH ART: 7.457 — AB (ref 7.350–7.450)
PO2 ART: 111 mmHg — AB (ref 80.0–100.0)
Patient temperature: 98.6
RATE: 14 resp/min
TCO2: 31.4 mmol/L (ref 0–100)

## 2015-06-19 LAB — COMPREHENSIVE METABOLIC PANEL
ALT: 23 U/L (ref 14–54)
ANION GAP: 11 (ref 5–15)
AST: 23 U/L (ref 15–41)
Albumin: 3.1 g/dL — ABNORMAL LOW (ref 3.5–5.0)
Alkaline Phosphatase: 55 U/L (ref 38–126)
BUN: 42 mg/dL — ABNORMAL HIGH (ref 6–20)
CHLORIDE: 96 mmol/L — AB (ref 101–111)
CO2: 30 mmol/L (ref 22–32)
Calcium: 9 mg/dL (ref 8.9–10.3)
Creatinine, Ser: 0.89 mg/dL (ref 0.44–1.00)
GFR calc Af Amer: >60 mL/min (ref >60)
GFR calc non Af Amer: >60 mL/min (ref >60)
Glucose, Bld: 384 mg/dL — ABNORMAL HIGH (ref 65–99)
Potassium: 6.3 mmol/L (ref 3.5–5.1)
SODIUM: 137 mmol/L (ref 135–145)
Total Bilirubin: 0.7 mg/dL (ref 0.3–1.2)
Total Protein: 5.9 g/dL — ABNORMAL LOW (ref 6.5–8.1)

## 2015-06-19 LAB — GLUCOSE, CAPILLARY
GLUCOSE-CAPILLARY: 308 mg/dL — AB (ref 65–99)
Glucose-Capillary: 115 mg/dL — ABNORMAL HIGH (ref 65–99)
Glucose-Capillary: 142 mg/dL — ABNORMAL HIGH (ref 65–99)
Glucose-Capillary: 164 mg/dL — ABNORMAL HIGH (ref 65–99)
Glucose-Capillary: 206 mg/dL — ABNORMAL HIGH (ref 65–99)
Glucose-Capillary: 283 mg/dL — ABNORMAL HIGH (ref 65–99)

## 2015-06-19 LAB — CBC WITH DIFFERENTIAL/PLATELET
Basophils Absolute: 0 10*3/uL (ref 0.0–0.1)
Basophils Relative: 0 %
EOS ABS: 0 10*3/uL (ref 0.0–0.7)
EOS PCT: 0 %
HCT: 40 % (ref 36.0–46.0)
Hemoglobin: 12.1 g/dL (ref 12.0–15.0)
LYMPHS ABS: 0.9 10*3/uL (ref 0.7–4.0)
Lymphocytes Relative: 5 %
MCH: 28 pg (ref 26.0–34.0)
MCHC: 30.3 g/dL (ref 30.0–36.0)
MCV: 92.6 fL (ref 78.0–100.0)
MONOS PCT: 6 %
Monocytes Absolute: 1 10*3/uL (ref 0.1–1.0)
Neutro Abs: 14.8 10*3/uL — ABNORMAL HIGH (ref 1.7–7.7)
Neutrophils Relative %: 89 %
PLATELETS: 311 10*3/uL (ref 150–400)
RBC: 4.32 MIL/uL (ref 3.87–5.11)
RDW: 14.7 % (ref 11.5–15.5)
WBC: 16.7 10*3/uL — AB (ref 4.0–10.5)

## 2015-06-19 LAB — BASIC METABOLIC PANEL
Anion gap: 10 (ref 5–15)
BUN: 33 mg/dL — ABNORMAL HIGH (ref 6–20)
CALCIUM: 9.6 mg/dL (ref 8.9–10.3)
CO2: 30 mmol/L (ref 22–32)
CREATININE: 0.58 mg/dL (ref 0.44–1.00)
Chloride: 98 mmol/L — ABNORMAL LOW (ref 101–111)
GFR calc non Af Amer: >60 mL/min (ref >60)
Glucose, Bld: 239 mg/dL — ABNORMAL HIGH (ref 65–99)
Potassium: 4.1 mmol/L (ref 3.5–5.1)
SODIUM: 138 mmol/L (ref 135–145)

## 2015-06-19 LAB — CBC
HCT: 35.9 % — ABNORMAL LOW (ref 36.0–46.0)
HEMOGLOBIN: 11.5 g/dL — AB (ref 12.0–15.0)
MCH: 28.7 pg (ref 26.0–34.0)
MCHC: 32 g/dL (ref 30.0–36.0)
MCV: 89.5 fL (ref 78.0–100.0)
Platelets: 227 10*3/uL (ref 150–400)
RBC: 4.01 MIL/uL (ref 3.87–5.11)
RDW: 14.7 % (ref 11.5–15.5)
WBC: 14.1 10*3/uL — ABNORMAL HIGH (ref 4.0–10.5)

## 2015-06-19 LAB — POCT I-STAT 3, ART BLOOD GAS (G3+)
Acid-Base Excess: 6 mmol/L — ABNORMAL HIGH (ref 0.0–2.0)
Bicarbonate: 33.2 mEq/L — ABNORMAL HIGH (ref 20.0–24.0)
O2 SAT: 97 %
PCO2 ART: 61.6 mmHg — AB (ref 35.0–45.0)
PH ART: 7.34 — AB (ref 7.350–7.450)
TCO2: 35 mmol/L (ref 0–100)
pO2, Arterial: 104 mmHg — ABNORMAL HIGH (ref 80.0–100.0)

## 2015-06-19 LAB — PHOSPHORUS: Phosphorus: 4.1 mg/dL (ref 2.5–4.6)

## 2015-06-19 LAB — MAGNESIUM: MAGNESIUM: 1.9 mg/dL (ref 1.7–2.4)

## 2015-06-19 LAB — HEPARIN LEVEL (UNFRACTIONATED): Heparin Unfractionated: 0.71 IU/mL — ABNORMAL HIGH (ref 0.30–0.70)

## 2015-06-19 LAB — LACTIC ACID, PLASMA
LACTIC ACID, VENOUS: 3.2 mmol/L — AB (ref 0.5–2.0)
Lactic Acid, Venous: 1.4 mmol/L (ref 0.5–2.0)
Lactic Acid, Venous: 3.3 mmol/L (ref 0.5–2.0)

## 2015-06-19 LAB — TROPONIN I

## 2015-06-19 MED ORDER — VITAMIN B-1 100 MG PO TABS
100.0000 mg | ORAL_TABLET | Freq: Every day | ORAL | Status: DC
  Administered 2015-06-19 – 2015-07-06 (×18): 100 mg
  Filled 2015-06-19 (×18): qty 1

## 2015-06-19 MED ORDER — FENTANYL CITRATE (PF) 100 MCG/2ML IJ SOLN
100.0000 ug | Freq: Once | INTRAMUSCULAR | Status: AC
  Administered 2015-06-19: 100 ug via INTRAVENOUS

## 2015-06-19 MED ORDER — FENTANYL CITRATE (PF) 100 MCG/2ML IJ SOLN: 50.0000 ug | Freq: Once | INTRAMUSCULAR | Status: DC

## 2015-06-19 MED ORDER — FENTANYL BOLUS VIA INFUSION
50.0000 ug | INTRAVENOUS | Status: DC | PRN
  Filled 2015-06-19: qty 50

## 2015-06-19 MED ORDER — MIDAZOLAM HCL 2 MG/2ML IJ SOLN: 2.0000 mg | INTRAMUSCULAR | Status: DC | PRN

## 2015-06-19 MED ORDER — PHENYLEPHRINE HCL 10 MG/ML IJ SOLN
30.0000 ug/min | INTRAMUSCULAR | Status: DC
  Administered 2015-06-19: 30 ug/min via INTRAVENOUS
  Filled 2015-06-19: qty 1

## 2015-06-19 MED ORDER — VITAL AF 1.2 CAL PO LIQD
1000.0000 mL | ORAL | Status: DC
  Administered 2015-06-19 – 2015-06-27 (×11): 1000 mL
  Filled 2015-06-19 (×13): qty 1000

## 2015-06-19 MED ORDER — PRO-STAT SUGAR FREE PO LIQD
30.0000 mL | Freq: Every day | ORAL | Status: DC
  Administered 2015-06-19 – 2015-06-28 (×10): 30 mL
  Filled 2015-06-19 (×10): qty 30

## 2015-06-19 MED ORDER — VITAL HIGH PROTEIN PO LIQD
1000.0000 mL | ORAL | Status: DC
  Filled 2015-06-19 (×2): qty 1000

## 2015-06-19 MED ORDER — SODIUM CHLORIDE 0.9 % IV BOLUS (SEPSIS)
1000.0000 mL | Freq: Once | INTRAVENOUS | Status: AC
  Administered 2015-06-19: 1000 mL via INTRAVENOUS

## 2015-06-19 MED ORDER — PHENYLEPHRINE HCL 10 MG/ML IJ SOLN
0.0000 ug/min | INTRAVENOUS | Status: DC
  Administered 2015-06-19: 200 ug/min via INTRAVENOUS
  Filled 2015-06-19: qty 1

## 2015-06-19 MED ORDER — ETOMIDATE 2 MG/ML IV SOLN
10.0000 mg | Freq: Once | INTRAVENOUS | Status: AC
  Administered 2015-06-19: 10 mg via INTRAVENOUS

## 2015-06-19 MED ORDER — SODIUM CHLORIDE 0.9 % IV BOLUS (SEPSIS)
500.0000 mL | Freq: Once | INTRAVENOUS | Status: AC
  Administered 2015-06-19: 500 mL via INTRAVENOUS

## 2015-06-19 MED ORDER — SODIUM CHLORIDE 0.9 % IV SOLN
1.0000 g | Freq: Once | INTRAVENOUS | Status: AC
  Administered 2015-06-19: 1 g via INTRAVENOUS
  Filled 2015-06-19: qty 10

## 2015-06-19 MED ORDER — SODIUM CHLORIDE 0.9 % IV SOLN
25.0000 ug/h | INTRAVENOUS | Status: DC
  Administered 2015-06-19 – 2015-06-21 (×3): 25 ug/h via INTRAVENOUS
  Filled 2015-06-19 (×2): qty 50

## 2015-06-19 MED ORDER — INSULIN ASPART 100 UNIT/ML ~~LOC~~ SOLN: 0.0000 [IU] | SUBCUTANEOUS | Status: DC

## 2015-06-19 MED ORDER — ACETAMINOPHEN 650 MG RE SUPP
650.0000 mg | RECTAL | Status: DC | PRN
Start: 2015-06-19 — End: 2015-06-22

## 2015-06-19 MED ORDER — NALOXONE HCL 0.4 MG/ML IJ SOLN
INTRAMUSCULAR | Status: AC
  Filled 2015-06-19: qty 1

## 2015-06-19 MED ORDER — DEXTROSE 50 % IV SOLN
50.0000 mL | Freq: Once | INTRAVENOUS | Status: AC
  Administered 2015-06-19: 50 mL via INTRAVENOUS
  Filled 2015-06-19: qty 50

## 2015-06-19 MED ORDER — SODIUM CHLORIDE 0.9 % IV SOLN: 250.0000 mL | INTRAVENOUS | Status: DC | PRN

## 2015-06-19 MED ORDER — PANTOPRAZOLE SODIUM 40 MG IV SOLR: 40.0000 mg | Freq: Every day | INTRAVENOUS | Status: DC

## 2015-06-19 MED ORDER — MIDAZOLAM HCL 2 MG/2ML IJ SOLN
2.0000 mg | Freq: Once | INTRAMUSCULAR | Status: AC
  Administered 2015-06-19: 2 mg via INTRAVENOUS

## 2015-06-19 MED ORDER — SODIUM CHLORIDE 0.9 % IV SOLN: INTRAVENOUS | Status: DC

## 2015-06-19 MED ORDER — FOLIC ACID 5 MG/ML IJ SOLN
1.0000 mg | Freq: Every day | INTRAMUSCULAR | Status: DC
  Filled 2015-06-19 (×2): qty 0.2

## 2015-06-19 MED ORDER — MIDAZOLAM HCL 2 MG/2ML IJ SOLN: 2.0000 mg | Freq: Once | INTRAMUSCULAR | Status: DC

## 2015-06-19 MED ORDER — ATORVASTATIN CALCIUM 40 MG PO TABS: 40.0000 mg | ORAL_TABLET | Freq: Every day | ORAL | Status: DC

## 2015-06-19 MED ORDER — INSULIN ASPART 100 UNIT/ML ~~LOC~~ SOLN
0.0000 [IU] | SUBCUTANEOUS | Status: DC
  Administered 2015-06-19: 2 [IU] via SUBCUTANEOUS
  Administered 2015-06-19: 5 [IU] via SUBCUTANEOUS
  Administered 2015-06-19: 11 [IU] via SUBCUTANEOUS
  Administered 2015-06-19: 8 [IU] via SUBCUTANEOUS
  Administered 2015-06-19 – 2015-06-20 (×3): 3 [IU] via SUBCUTANEOUS
  Administered 2015-06-20: 8 [IU] via SUBCUTANEOUS
  Administered 2015-06-20: 5 [IU] via SUBCUTANEOUS
  Administered 2015-06-20 – 2015-06-21 (×2): 3 [IU] via SUBCUTANEOUS
  Administered 2015-06-21: 5 [IU] via SUBCUTANEOUS
  Administered 2015-06-21 (×3): 3 [IU] via SUBCUTANEOUS
  Administered 2015-06-21: 2 [IU] via SUBCUTANEOUS
  Administered 2015-06-21 – 2015-06-23 (×7): 3 [IU] via SUBCUTANEOUS
  Administered 2015-06-23: 2 [IU] via SUBCUTANEOUS
  Administered 2015-06-23: 5 [IU] via SUBCUTANEOUS
  Administered 2015-06-23 (×2): 3 [IU] via SUBCUTANEOUS
  Administered 2015-06-23: 2 [IU] via SUBCUTANEOUS
  Administered 2015-06-24 (×7): 3 [IU] via SUBCUTANEOUS
  Administered 2015-06-25: 2 [IU] via SUBCUTANEOUS
  Administered 2015-06-25: 3 [IU] via SUBCUTANEOUS
  Administered 2015-06-25: 2 [IU] via SUBCUTANEOUS
  Administered 2015-06-25: 3 [IU] via SUBCUTANEOUS
  Administered 2015-06-25 – 2015-06-26 (×4): 2 [IU] via SUBCUTANEOUS
  Administered 2015-06-26: 3 [IU] via SUBCUTANEOUS
  Administered 2015-06-26 (×2): 2 [IU] via SUBCUTANEOUS
  Administered 2015-06-27 (×2): 3 [IU] via SUBCUTANEOUS
  Administered 2015-06-27 (×2): 2 [IU] via SUBCUTANEOUS
  Administered 2015-06-28: 3 [IU] via SUBCUTANEOUS
  Administered 2015-06-28: 2 [IU] via SUBCUTANEOUS
  Administered 2015-06-28: 3 [IU] via SUBCUTANEOUS
  Administered 2015-06-28 – 2015-06-29 (×4): 2 [IU] via SUBCUTANEOUS
  Administered 2015-06-29: 3 [IU] via SUBCUTANEOUS
  Administered 2015-06-29: 2 [IU] via SUBCUTANEOUS
  Administered 2015-06-29 – 2015-06-30 (×6): 3 [IU] via SUBCUTANEOUS
  Administered 2015-06-30 (×2): 2 [IU] via SUBCUTANEOUS
  Administered 2015-06-30 – 2015-07-01 (×2): 3 [IU] via SUBCUTANEOUS
  Administered 2015-07-01: 2 [IU] via SUBCUTANEOUS
  Administered 2015-07-01: 3 [IU] via SUBCUTANEOUS
  Administered 2015-07-01: 2 [IU] via SUBCUTANEOUS
  Administered 2015-07-01 – 2015-07-02 (×2): 3 [IU] via SUBCUTANEOUS
  Administered 2015-07-02: 5 [IU] via SUBCUTANEOUS
  Administered 2015-07-02 (×2): 3 [IU] via SUBCUTANEOUS
  Administered 2015-07-02: 2 [IU] via SUBCUTANEOUS

## 2015-06-19 MED ORDER — CHLORHEXIDINE GLUCONATE 0.12% ORAL RINSE (MEDLINE KIT)
15.0000 mL | Freq: Two times a day (BID) | OROMUCOSAL | Status: DC
  Administered 2015-06-19 – 2015-06-28 (×19): 15 mL via OROMUCOSAL

## 2015-06-19 MED ORDER — PHENYLEPHRINE HCL 10 MG/ML IJ SOLN
0.0000 ug/min | INTRAVENOUS | Status: DC
  Filled 2015-06-19: qty 1

## 2015-06-19 MED ORDER — INSULIN ASPART 100 UNIT/ML ~~LOC~~ SOLN
10.0000 [IU] | Freq: Once | SUBCUTANEOUS | Status: AC
  Administered 2015-06-19: 10 [IU] via INTRAVENOUS

## 2015-06-19 MED ORDER — FENTANYL BOLUS VIA INFUSION: 50.0000 ug | INTRAVENOUS | Status: DC | PRN

## 2015-06-19 MED ORDER — MIDAZOLAM HCL 2 MG/2ML IJ SOLN
INTRAMUSCULAR | Status: AC
  Filled 2015-06-19: qty 2

## 2015-06-19 MED ORDER — SODIUM CHLORIDE 0.9 % IV SOLN
INTRAVENOUS | Status: DC
  Administered 2015-06-19 – 2015-06-27 (×2): via INTRAVENOUS

## 2015-06-19 MED ORDER — THIAMINE HCL 100 MG/ML IJ SOLN: 100.0000 mg | Freq: Every day | INTRAMUSCULAR | Status: DC

## 2015-06-19 MED ORDER — FOLIC ACID 1 MG PO TABS: 1.0000 mg | ORAL_TABLET | Freq: Every day | ORAL | Status: DC

## 2015-06-19 MED ORDER — PANTOPRAZOLE SODIUM 40 MG PO PACK
40.0000 mg | PACK | ORAL | Status: DC
  Administered 2015-06-19 – 2015-07-06 (×18): 40 mg
  Filled 2015-06-19 (×18): qty 20

## 2015-06-19 MED ORDER — ROCURONIUM BROMIDE 50 MG/5ML IV SOLN: 10.0000 mg/kg | Freq: Once | INTRAVENOUS | Status: DC

## 2015-06-19 MED ORDER — NALOXONE HCL 0.4 MG/ML IJ SOLN: 0.4000 mg | Freq: Once | INTRAMUSCULAR | Status: DC

## 2015-06-19 MED ORDER — VANCOMYCIN HCL IN DEXTROSE 1-5 GM/200ML-% IV SOLN
1000.0000 mg | Freq: Two times a day (BID) | INTRAVENOUS | Status: DC
  Administered 2015-06-19 – 2015-06-20 (×3): 1000 mg via INTRAVENOUS
  Filled 2015-06-19 (×5): qty 200

## 2015-06-19 MED ORDER — FAMOTIDINE IN NACL 20-0.9 MG/50ML-% IV SOLN
20.0000 mg | Freq: Two times a day (BID) | INTRAVENOUS | Status: DC
  Administered 2015-06-19: 20 mg via INTRAVENOUS
  Filled 2015-06-19: qty 50

## 2015-06-19 MED ORDER — DEXTROSE 5 % IV SOLN
1.0000 g | Freq: Three times a day (TID) | INTRAVENOUS | Status: DC
  Administered 2015-06-19 – 2015-06-21 (×6): 1 g via INTRAVENOUS
  Filled 2015-06-19 (×8): qty 1

## 2015-06-19 MED ORDER — HEPARIN (PORCINE) IN NACL 100-0.45 UNIT/ML-% IJ SOLN
1500.0000 [IU]/h | INTRAMUSCULAR | Status: DC
  Administered 2015-06-19: 1200 [IU]/h via INTRAVENOUS
  Administered 2015-06-20 – 2015-06-21 (×2): 1350 [IU]/h via INTRAVENOUS
  Filled 2015-06-19 (×7): qty 250

## 2015-06-19 MED ORDER — VANCOMYCIN HCL 10 G IV SOLR
1500.0000 mg | Freq: Once | INTRAVENOUS | Status: AC
  Administered 2015-06-19: 1500 mg via INTRAVENOUS
  Filled 2015-06-19: qty 1500

## 2015-06-19 MED ORDER — FAMOTIDINE IN NACL 20-0.9 MG/50ML-% IV SOLN: 20.0000 mg | Freq: Two times a day (BID) | INTRAVENOUS | Status: DC

## 2015-06-19 MED ORDER — VITAMIN B-1 100 MG PO TABS: 100.0000 mg | ORAL_TABLET | Freq: Every day | ORAL | Status: DC

## 2015-06-19 MED ORDER — FOLIC ACID 1 MG PO TABS
1.0000 mg | ORAL_TABLET | Freq: Every day | ORAL | Status: DC
  Administered 2015-06-19 – 2015-07-06 (×18): 1 mg
  Filled 2015-06-19 (×18): qty 1

## 2015-06-19 MED ORDER — SODIUM CHLORIDE 0.9 % IV SOLN: 25.0000 ug/h | INTRAVENOUS | Status: DC

## 2015-06-19 MED ORDER — ATORVASTATIN CALCIUM 40 MG PO TABS
40.0000 mg | ORAL_TABLET | Freq: Every day | ORAL | Status: DC
Start: 2015-06-19 — End: 2015-06-19

## 2015-06-19 MED ORDER — MIDAZOLAM HCL 2 MG/2ML IJ SOLN
2.0000 mg | INTRAMUSCULAR | Status: DC | PRN
  Administered 2015-06-19: 2 mg via INTRAVENOUS

## 2015-06-19 MED ORDER — ANTISEPTIC ORAL RINSE SOLUTION (CORINZ)
7.0000 mL | Freq: Four times a day (QID) | OROMUCOSAL | Status: DC
  Administered 2015-06-19 – 2015-06-28 (×38): 7 mL via OROMUCOSAL

## 2015-06-19 MED ORDER — ROCURONIUM BROMIDE 50 MG/5ML IV SOLN
1.0000 mg/kg | Freq: Once | INTRAVENOUS | Status: AC
  Administered 2015-06-19: 50 mg via INTRAVENOUS

## 2015-06-19 MED ORDER — ACETAMINOPHEN 160 MG/5ML PO SOLN
650.0000 mg | Freq: Four times a day (QID) | ORAL | Status: DC | PRN
  Administered 2015-06-19 – 2015-07-04 (×4): 650 mg
  Filled 2015-06-19 (×4): qty 20.3

## 2015-06-19 NOTE — Progress Notes (Signed)
EEG completed; results pending.    

## 2015-06-19 NOTE — Progress Notes (Addendum)
Call received per floor at 2320 RN regarding Pt with increased RR, and lethargy. According to floor RN dayshift reported Pt as lethargic all day but arousable with family this afternoon. Advised RN to obtain rectal temp while RRT RN en route. Upon my arrival Pt found in bed, unresponsive to deep sternal rub. Breath sounds very diminished, po2 initially 94% on 2 LNC, HR 55, RR 35, BP 86/37. Rectal temp obtained yielding 96.0. Pt repositioned in bed, HR dropped to 43 with po2 83-85 on 2 LNC, increased 02 to 6 LNC with no improvement. Pt placed on NRB with sats improved to 95%, HR imporved slowly to 70s. CXR ABG, Lactic Acid, Cmet, CBC, orders placed per RRT protocol. NS Bolus started STAT.  [facility] called and updated on Pt status, as well as orders placed, PCCM consulted STAT per P.A. Orders received to give Narcan STAT, given per floor RN. EKG, ABG, CXR obtained. Results called to Dr. Jimmy Footman at [facility]. [facility] P[facility] at bedside to assess Pt 0015, P.A updated on Pt status and results. Orders obtained to transfer to ICU for intubation. Bed placement called and Pt transferred emergently to 3 M at [facility]. Bedside report provided per floor RN. Patient intubated with difficulty per PCCM MD at [phone]

## 2015-06-19 NOTE — Progress Notes (Signed)
ANTIBIOTIC CONSULT NOTE - INITIAL  Pharmacy Consult for vancomycin + ceftazidime Indication: rule out sepsis  Allergies  Allergen Reactions  . Codeine Nausea Only    Nausea?-- "made her sick"  . Oxycodone Nausea Only    Sick   . Oxycontin [Oxycodone Hcl] Nausea Only    Sick     Patient Measurements: Height: 5\' 5"  (165.1 cm) Weight: 174 lb 6.1 oz (79.1 kg) IBW/kg (Calculated) : 57 Adjusted Body Weight:   Vital Signs: Temp: 100.2 F (37.9 C) (12/27 0753) Temp Source: Axillary (12/27 0753) BP: 69/47 mmHg (12/27 0830) Pulse Rate: 83 (12/27 0830) Intake/Output from previous day: 12/26 0701 - 12/27 0700 In: 457.5 [I.V.:307.5; IV Piggyback:150] Out: 850 [Urine:850] Intake/Output from this shift: Total I/O In: 318.2 [I.V.:318.2] Out: 125 [Urine:125]  Labs:  Recent Labs  06/16/15 1708  06/18/15 0650 06/19/15 0110 06/19/15 0526  WBC 12.7*  < > 12.4* 16.7* 14.1*  HGB 12.6  < > 12.0 12.1 11.5*  PLT 310  < > 295 311 227  CREATININE 0.48  --   --  0.89  --   < > = values in this interval not displayed. Estimated Creatinine Clearance: 71.6 mL/min (by C-G formula based on Cr of 0.89). No results for input(s): VANCOTROUGH, VANCOPEAK, VANCORANDOM, GENTTROUGH, GENTPEAK, GENTRANDOM, TOBRATROUGH, TOBRAPEAK, TOBRARND, AMIKACINPEAK, AMIKACINTROU, AMIKACIN in the last Kerry hours.   Microbiology: Recent Results (from the past 720 hour(s))  Culture, Urine     Status: None   Collection Time: 05/29/15  4:59 PM  Result Value Ref Range Status   Specimen Description URINE, CLEAN CATCH  Final   Special Requests NONE  Final   Culture >=100,000 COLONIES/mL ESCHERICHIA COLI  Final   Report Status 06/01/2015 FINAL  Final   Organism ID, Bacteria ESCHERICHIA COLI  Final      Susceptibility   Escherichia coli - MIC*    AMPICILLIN <=2 SENSITIVE Sensitive     CEFAZOLIN <=4 SENSITIVE Sensitive     CEFTRIAXONE <=1 SENSITIVE Sensitive     CIPROFLOXACIN <=0.25 SENSITIVE Sensitive     GENTAMICIN  <=1 SENSITIVE Sensitive     IMIPENEM <=0.25 SENSITIVE Sensitive     NITROFURANTOIN <=16 SENSITIVE Sensitive     TRIMETH/SULFA <=20 SENSITIVE Sensitive     AMPICILLIN/SULBACTAM <=2 SENSITIVE Sensitive     PIP/TAZO <=4 SENSITIVE Sensitive     * >=100,000 COLONIES/mL ESCHERICHIA COLI  Culture, blood (x 2)     Status: None   Collection Time: 06/07/15  8:19 AM  Result Value Ref Range Status   Specimen Description BLOOD RIGHT HAND  Final   Special Requests BOTTLES DRAWN AEROBIC ONLY 4ML  Final   Culture NO GROWTH 5 DAYS  Final   Report Status 06/12/2015 FINAL  Final  Culture, blood (x 2)     Status: None   Collection Time: 06/07/15  8:25 AM  Result Value Ref Range Status   Specimen Description BLOOD RIGHT HAND  Final   Special Requests BOTTLES DRAWN AEROBIC ONLY 1ML  Final   Culture NO GROWTH 5 DAYS  Final   Report Status 06/12/2015 FINAL  Final  MRSA PCR Screening     Status: None   Collection Time: 06/08/15  2:14 PM  Result Value Ref Range Status   MRSA by PCR NEGATIVE NEGATIVE Final    Comment:        The GeneXpert MRSA Assay (FDA approved for NASAL specimens only), is one component of a comprehensive MRSA colonization surveillance program. It is not intended  to diagnose MRSA infection nor to guide or monitor treatment for MRSA infections.     Medical History: Past Medical History  Diagnosis Date  . Hypertension   . Coronary artery disease   . Diabetes mellitus   . Breast cancer ([place]) 03/10/2007    Left breat  . Degenerative disc disease, cervical   . Degenerative disc disease, lumbar   . Fibromyalgia   . Hyperlipidemia   . Carpal tunnel syndrome on right   . PE (pulmonary embolism)   . UTI (lower urinary tract infection)   . GIB (gastrointestinal bleeding)   . Elevated troponin   . HTN (hypertension) 06/13/2015  . 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    Medications:  Anti-infectives    Start     Dose/Rate  Route Frequency Ordered Stop   06/19/15 2200  vancomycin (VANCOCIN) IVPB 1000 mg/200 mL premix     1,000 mg 200 mL/hr over 60 Minutes Intravenous Every 12 hours 06/19/15 0921     06/19/15 1000  cefTAZidime (FORTAZ) 1 g in dextrose 5 % 50 mL IVPB     1 g 100 mL/hr over 30 Minutes Intravenous Every 8 hours 06/19/15 0921     06/19/15 1000  vancomycin (VANCOCIN) 1,500 mg in sodium chloride 0.9 % 500 mL IVPB     1,500 mg 250 mL/hr over 120 Minutes Intravenous  Once 06/19/15 [id number]       Assessment: Kerry [name] transferred from rehab after being found unresponsive. Now to start broad-spectrum antibiotics with vancomycin and ceftazidime for possible sepsis. Pt with TMx 100.2 and WBC of 14.1. Cultures are pending.   Vanc 12/27>> Ceftaz 12/27>>  Goal of Therapy:  Vancomycin trough level 15-20 mcg/ml  Plan:  - Vancomycin 1500mg  IV x 1 then 1gm IV Q12H - Ceftazidime 1gm IV Q8H - F/u renal fxn, C&S, clinical status and trough at [place], Rande Lawman 06/19/2015,9:21 AM

## 2015-06-19 NOTE — Procedures (Signed)
ELECTROENCEPHALOGRAM REPORT  Date of Study: 06/19/2015  Patient's Name: Kerry [name] MRN: [id number] Date of Birth: [birth year]  Referring Provider: Dr. Chesley Mires  Clinical History: This is a 59 year old woman with recent left MCA stroke who had an episode of unresponsiveness and hypoxia.   Medications: Fentanyl acetaminophen (TYLENOL) cefTAZidime (FORTAZ) 1 g in dextrose 5 % 50 mL IVPB folic acid (FOLVITE) tablet 1 mg pantoprazole sodium (PROTONIX) 40 mg/20 mL oral suspension 40 mg phenylephrine (NEO-SYNEPHRINE) 10 mg in dextrose 5 % 250 mL (0.04 mg/mL) infusion thiamine (VITAMIN B-1) tablet 100 mg vancomycin (VANCOCIN) IVPB 1000 mg/200 mL premix  Technical Summary: A multichannel digital EEG recording measured by the international 10-20 system with electrodes applied with paste and impedances below 5000 ohms performed as portable with EKG monitoring in an intubated and unresponsive patient. She has been receiving Fentanyl on and off.  Hyperventilation and photic stimulation were not performed.  The digital EEG was referentially recorded, reformatted, and digitally filtered in a variety of bipolar and referential montages for optimal display.   Description: The patient is intubated and unresponsive during the recording. There is no clear posterior dominant rhythm. The background consists of a large amount of diffuse 4-5 Hz theta and 2-3 Hz delta slowing, with additional low to medium 2 Hz delta slowing seen over the left posterior temporal region. Asymmetric poorly formed vertex waves are seen better over the right parasagittal derivations. Poorly formed sleep spindles are seen. Hyperventilation and photic stimulation were not performed.  There were no epileptiform discharges or electrographic seizures seen.    EKG lead was unremarkable.  Impression: This EEG is abnormal due to the presence of: 1. Moderate diffuse slowing of the background 2. Additional focal slowing over the  left posterior temporal region  Clinical Correlation of the above findings indicates diffuse cerebral dysfunction that is non-specific in etiology and can be seen with hypoxic/ischemic injury, toxic/metabolic encephalopathies, or medication effect from Fentanyl. Additional focal slowing over the left posterior temporal region indicates focal cerebral dysfunction in this region suggestive of underlying structural or physiologic abnormality. The absence of epileptiform discharges does not rule out a clinical diagnosis of epilepsy.  Clinical correlation is advised.   Ellouise Newer, M.D.

## 2015-06-19 NOTE — H&P (Signed)
PULMONARY / CRITICAL CARE MEDICINE   Name: Kerry [name] MRN: [id number] DOB: Oct 26, 1955    ADMISSION DATE:  06/19/2015 CONSULTATION DATE:  06/19/2015  REFERRING MD:  CIR  CHIEF COMPLAINT:  Unresponsiveness  HISTORY OF PRESENT ILLNESS:  Pt is encephelopathic; therefore, this HPI is obtained from chart review. Kerry [name] is a 59 y.o. female with PMH as outlined below. 59 y.o. female Initially admitted to [facility] 10/19 with L MCA infarct. She had stenting and embolectomy by IR and was later discharged to [facility] before being admitted to [facility] 11/23. On 12/15, she had tachycardia and hypotension and was found to have small RLL PE. PCCM called for recs. She was given volume and responded appropriately.    12/27, PCCM called back early AM due to unresponsiveness and hypoxia.  ABG revealed hypercarbic respiratory failure.  She was transferred to the ICU and required intubation.   PAST MEDICAL HISTORY :  She  has a past medical history of Hypertension; Coronary artery disease; Diabetes mellitus; Breast cancer ([place]) (03/10/2007); Degenerative disc disease, cervical; Degenerative disc disease, lumbar; Fibromyalgia; Hyperlipidemia; Carpal tunnel syndrome on right; PE (pulmonary embolism); UTI (lower urinary tract infection); GIB (gastrointestinal bleeding); Elevated troponin; HTN (hypertension) (06/13/2015); and Stridor.  PAST SURGICAL HISTORY: She  has past surgical history that includes Cardiac surgery; Appendectomy; Breast surgery; Mastectomy (Left, 03/2007); Cardiac catheterization (2005); Vein Surgery (Right, 2005); Tubal ligation (Right); Radiology with anesthesia (N/A, 04/11/2015); Cardiac catheterization (N/A, 04/13/2015); PEG placement (N/A, 04/18/2015); and Esophagogastroduodenoscopy (N/A, 04/18/2015).  Allergies  Allergen Reactions  . Codeine Nausea Only    Nausea?-- "made her sick"  . Oxycodone Nausea Only    Sick   . Oxycontin [Oxycodone Hcl] Nausea Only    Sick      Current Facility-Administered Medications on File Prior to Encounter  Medication  . midazolam (VERSED) injection 2 mg  . phenylephrine (NEO-SYNEPHRINE) 10 mg in dextrose 5 % 250 mL (0.04 mg/mL) infusion   Current Outpatient Prescriptions on File Prior to Encounter  Medication Sig  . acetaminophen (TYLENOL) 160 MG/5ML solution Take 10.2-20.3 mLs (325-650 mg total) by mouth every 4 (four) hours as needed for mild pain.  Marland Kitchen atorvastatin (LIPITOR) 40 MG tablet Take 1 tablet (40 mg total) by mouth daily at 6 PM.  . carvedilol (COREG) 12.5 MG tablet Take 1 tablet (12.5 mg total) by mouth 2 (two) times daily with a meal.  . feeding supplement, ENSURE, (ENSURE) PUDG Take 1 Container by mouth 3 (three) times daily between meals.  . folic acid (FOLVITE) 1 MG tablet Take 1 mg by mouth daily.  Marland Kitchen glipiZIDE (GLUCOTROL) 5 MG tablet Take by mouth daily before breakfast.  . hydrocerin (EUCERIN) CREA Apply 1 application topically 2 (two) times daily. To bilateral feet  . Insulin Glargine (LANTUS) 100 UNIT/ML Solostar Pen Inject 5 Units into the skin daily at 10 pm.  . Insulin Pen Needle 32G X 4 MM MISC Use with insulin pen to dispense insulin daily  . lisinopril (PRINIVIL,ZESTRIL) 10 MG tablet Take 1 tablet (10 mg total) by mouth daily.  . Melatonin 3 MG TABS Take 6 mg by mouth at bedtime.  . Multiple Vitamins-Minerals (MULTIVITAMINS THER. W/MINERALS) TABS tablet Take 1 tablet by mouth daily.  . Nutritional Supplements (FEEDING SUPPLEMENT, VITAL AF 1.2 CAL,) LIQD 1 can three times daily; flush with 200 cc water after can is given  . Rivaroxaban (XARELTO) 15 MG TABS tablet Take 1 tablet (15 mg total) by mouth 2 (two) times daily with a  meal. X 16 days  . [START ON 06/30/2015] rivaroxaban (XARELTO) 20 MG TABS tablet Take 1 tablet (20 mg total) by mouth daily with supper.  . senna-docusate (SENNA PLUS) 8.6-50 MG tablet Take 1 tablet by mouth 2 (two) times daily as needed for mild constipation.  . sodium  chloride (OCEAN) 0.65 % SOLN nasal spray Place 1 spray into both nostrils 3 (three) times daily. To help with nose bleeds  . thiamine 100 MG tablet Take 100 mg by mouth daily.  . Water For Irrigation, Sterile (FREE WATER) SOLN Place 200 mLs into feeding tube every 8 (eight) hours.  Marland Kitchen zinc sulfate 220 MG capsule Take 220 mg by mouth daily at 6 PM.    FAMILY HISTORY:  Her indicated that her mother is alive. She indicated that her father is alive. She indicated that only one of her two maternal aunts is alive. She indicated that her cousin is alive.   SOCIAL HISTORY: She  reports that she has been smoking.  She has never used smokeless tobacco. She reports that she drinks alcohol. She reports that she does not use illicit drugs.  REVIEW OF SYSTEMS: Unable to obtain as pt is encephalopathic.  SUBJECTIVE:  Completely unresponsive.  VITAL SIGNS: There were no vitals taken for this visit.  HEMODYNAMICS:    VENTILATOR SETTINGS: Vent Mode:  [-] PRVC FiO2 (%):  [40 %-60 %] 40 % Set Rate:  [22 bmp-30 bmp] 22 bmp Vt Set:  [440 mL-550 mL] 550 mL PEEP:  [5 cmH20] 5 cmH20  INTAKE / OUTPUT:     PHYSICAL EXAMINATION: General: Adult female, completely unresponsive. Neuro: Completely unresponsive.  GCS 3. HEENT: [place]/AT. PERRL.  Old trach site noted. Cardiovascular: RRR, no M/R/G.  Lungs: Respirations shallow and labored.  Clear bilaterally. Abdomen: BS x 4, soft, NT/ND.  PEG in place. Musculoskeletal: No gross deformities, no edema.  Skin: Intact, warm, no rashes.  LABS:  BMET  Recent Labs Lab 06/14/15 0555 06/16/15 1708 06/19/15 0110  NA 138 139 137  K 3.8 4.5 6.3*  CL 98* 97* 96*  CO2 30 34* 30  BUN 7 28* 42*  CREATININE 0.32* 0.48 0.89  GLUCOSE 191* 231* 384*    Electrolytes  Recent Labs Lab 06/12/15 0640 06/14/15 0555 06/16/15 1708 06/19/15 0110  CALCIUM 9.4 9.1 9.6 9.0  MG 1.9  --   --   --     CBC  Recent Labs Lab 06/17/15 0513 06/18/15 0650  06/19/15 0110  WBC 12.3* 12.4* 16.7*  HGB 13.1 12.0 12.1  HCT 40.8 38.1 40.0  PLT 193 295 311    Coag's No results for input(s): APTT, INR in the last 168 hours.  Sepsis Markers  Recent Labs Lab 06/16/15 1708 06/19/15 0110  LATICACIDVEN 1.1 1.4    ABG  Recent Labs Lab 06/19/15 0005  PHART 7.019*  PCO2ART 157*  PO2ART 283*    Liver Enzymes  Recent Labs Lab 06/14/15 0555 06/19/15 0110  AST 20 23  ALT 24 23  ALKPHOS 52 55  BILITOT 0.4 0.7  ALBUMIN 2.9* 3.1*    Cardiac Enzymes  Recent Labs Lab 06/19/15 0110  TROPONINI <0.03    Glucose  Recent Labs Lab 06/17/15 1622 06/17/15 2211 06/18/15 0637 06/18/15 1117 06/18/15 1634 06/18/15 2123  GLUCAP 208* 255* 186* 244* 291* 393*    Imaging Dg Chest Port 1 View  06/19/2015  CLINICAL DATA:  Endotracheal tube repositioning.  Initial encounter. EXAM: PORTABLE CHEST 1 VIEW COMPARISON:  Chest radiograph performed  earlier today at 1:23 a.m. FINDINGS: The patient's endotracheal tube is seen ending 1-2 cm above the carina. This could be retracted 1-2 cm, as deemed clinically appropriate. The enteric tube is seen extending below the diaphragm. Right basilar airspace opacity appears improved, and may reflect atelectasis or possibly mild pneumonia. No definite pleural effusion or pneumothorax is seen. The cardiomediastinal silhouette is normal in size. No acute osseous abnormalities are identified. External pacing pads are noted. IMPRESSION: 1. Endotracheal tube seen ending 1-2 cm above the carina. This could be retracted 1-2 cm, as deemed clinically appropriate. 2. Right basilar airspace opacity appears improved, and may reflect atelectasis or possibly mild pneumonia. These results were called by telephone at the time of interpretation on 06/19/2015 at 1:58 am to [facility] on [facility], who verbally acknowledged these results. Electronically Signed   By: Garald Balding M.D.   On: 06/19/2015 02:04   Dg Chest Port 1  View  06/19/2015  CLINICAL DATA:  Endotracheal tube placement.  Initial encounter. EXAM: PORTABLE CHEST 1 VIEW COMPARISON:  None. FINDINGS: The patient's endotracheal tube is seen ending just above the carina. This should be retracted 2-3 cm. The enteric tube is noted extending below the diaphragm. The lungs are well-aerated. Right basilar airspace opacity may reflect atelectasis or pneumonia. A small right pleural effusion is suspected. No pneumothorax is seen. The cardiomediastinal silhouette is within normal limits. No acute osseous abnormalities are seen. External pacing pads are noted. IMPRESSION: 1. Endotracheal tube seen ending just above the carina. This should be retracted 3 cm. 2. Right basilar airspace opacity may reflect atelectasis or pneumonia. Small right pleural effusion suspected. These results were called by telephone at the time of interpretation on 06/19/2015 at 1:58 am to [facility] on [facility], who verbally acknowledged these results. Electronically Signed   By: Garald Balding M.D.   On: 06/19/2015 02:02   Dg Chest Port 1 View  06/19/2015  CLINICAL DATA:  59 year old female with shortness of breath EXAM: PORTABLE CHEST 1 VIEW COMPARISON:  Radiograph dated 06/18/2015 FINDINGS: The patient is tilted to the right. There is stable cardiomegaly. Bibasilar linear atelectatic changes/scarring noted. There is no focal consolidation, pleural effusion, or pneumothorax. There is degenerative changes of the spine. No acute fracture. IMPRESSION: Stable cardiomegaly.  No acute cardiopulmonary process. Electronically Signed   By: Anner Crete M.D.   On: 06/19/2015 00:41   Dg Chest Port 1 View  06/18/2015  CLINICAL DATA:  Acute onset of shortness of breath on exertion. Initial encounter. EXAM: PORTABLE CHEST 1 VIEW COMPARISON:  Chest radiograph performed 06/16/2015 FINDINGS: The lungs are well-aerated. Mild bibasilar atelectasis is noted. There is no evidence of pleural effusion or pneumothorax. The  cardiomediastinal silhouette is enlarged. No acute osseous abnormalities are seen. IMPRESSION: Mild bibasilar atelectasis noted.  Cardiomegaly noted. Electronically Signed   By: Garald Balding M.D.   On: 06/18/2015 04:44   Dg Abd Portable 1v  06/19/2015  CLINICAL DATA:  Orogastric tube placement.  Initial encounter. EXAM: PORTABLE ABDOMEN - 1 VIEW COMPARISON:  Abdominal radiograph performed 06/04/2015 FINDINGS: The patient's enteric tube is noted ending overlying the body of the stomach. The endotracheal tube is noted ending just above the carina. This should be retracted 2-3 cm. The visualized bowel gas pattern is difficult to fully assess due to motion artifact, though contrast is seen within the colon. Right basilar airspace opacity is noted. No pleural effusion is seen. No acute osseous abnormalities are identified. External pacing pads are noted. IMPRESSION: 1.  Enteric tube noted ending overlying the body of the stomach. 2. Endotracheal tube seen ending just above the carina. This should be retracted 2-3 cm. 3. Right basilar airspace opacity noted. These results were called by telephone at the time of interpretation on 06/19/2015 at 1:58 am to [facility] on [facility], who verbally acknowledged these results. Electronically Signed   By: Garald Balding M.D.   On: 06/19/2015 02:00     STUDIES:  CTA chest 12/14 > subsegmental RLL PE. LE duplex 12/15 > non occlusive DVT in R CVF. 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 CT head 12/17 > normal changes of large left MCA infarct.  CULTURES: None.  ANTIBIOTICS: None.  SIGNIFICANT EVENTS: 10/19 > admitted with L MCA infarct. 10/26 > trach / PEG. 11/4 > discharged to [facility]. 11/23 > admitted to CIR. 12/15 > tachycardia and hypotension > small RLL subsegmental PE found > PCCM consulted. Started on heparin and later transitioned to xarelto (12/19). Transferred to SDU under [place]. 12/16 > ENT consult > had laryngoscopy that showed vocal fold  paresis / hypomobility (recommended considering outpatient referral to laryngologist at [facility] center to consider laser arytenoidectomy / partial cordotomy). 12/20 > discharged and admitted back to CIR. 12/22 > SLP eval. Recommended dysphagia 2 diet. 12/24 > respiratory distress. No new interventions. 12/27 > unresponsive when RN checked on pt. Rapid response and PCCM called. Transferred to ICU and intubated.   LINES/TUBES: ETT 12/27 >   ASSESSMENT / PLAN:  PULMONARY A: Acute on chronic hypercarbic and hypoxic respiratory failure. Known small RLL PE - has been on xarelto. Known vocal cord paresis / hypomobility - as seen on laryngoscopy by ENT 12/16 who recommended considering outpatient referral to laryngologist at [facility] center to consider laser arytenoidectomy / partial cordotomy. Hx trach last admission (10/26/ unknown when she was decannulated). DIFFICULT INTUBATION!!! P:   Full vent support. Would be extremely cautions with extubation given her difficult airway, consider trach (she had grade 2 view with glidescope; however, cords almost completely shut making it very difficult to pass ETT through). VAP prevention measures. Heparin gtt in lieu of xarelto incase of any procedures. CXR in AM.  CARDIOVASCULAR A:  Hypotension - exacerbated by sedation during intubation; Now resolving. Hx HTN, CAD, HLD. P:  Continue neosynephrine as needed to maintain goal MAP > 65. Continue outpatient atorvastatin. Hold coreg, lisinopril. Assess troponin, lactate.  RENAL A:   No acute issues. P:   NS @ 100. Assess CMP now and BMP in AM.  GASTROINTESTINAL A:   GI prophylaxis. Dysphagia - s/p PEG placement 10/26. Nutrition. P:   SUP: Famotidine. Nutrition consult for TF's.  HEMATOLOGIC A:   Known PE - currently on xarelto.  LE duplex did also reveal  DVT in right CFV. VTE Prophylaxis. P:  Heparin gtt in lieu of xarelto. SCD's left leg only. CBC now and in  AM.  INFECTIOUS A:   No indication of infection. Hx E.Coli UTI - s/p ancef. P:   Monitor clinically.  ENDOCRINE A:   DM.  P:   SSI. Hold lantus, glipizide.  NEUROLOGIC A:   Known large left MCA infarct with small hemorrhagic conversion following tPA administration. P:   Sedation:  Fentanyl gtt / Midazolam PRN. RASS goal: 0 to -1. Daily WUA.   Family updated: Husband over phone.  He will be coming to hospital soon.  Interdisciplinary Family Meeting v Palliative Care Meeting:  Due by: 06/25/15.   Montey Hora, PA - [facility] [place] [facility] Pager: [phone]  or [phone] 06/19/2015, 2:28 AM   Attending Addendum: Attending note: I have seen and examined the patient with nurse practitioner/resident and agree with the note. History, labs and imaging reviewed.  59 year old with complicated medical history. Admitted for MCA infarct, hemorrhagic conversion. Right lower lobe PE, acute on chronic hypercarbic respiratory failure, vocal cord paralysis. He returns to the ICU with hypercarbic respiratory failure.  Plan: Difficult intubation. Consider tracheostomy. Switch to heparin anticoagulation for PE No evidence of infection. Observe off antibiotics Hyperkalemia noted. Given calcium, insulin, glucose. Check EKG and follow BMP. Transient hypotension. Weaned off neo. NS @ 100cc/hr.  Rest of plan as above.  Critical Care- 45 mins. This represents my time independent of NPs time taking care of the patient.  Marshell Garfinkel MD [place] [facility] Pager [phone] If no answer or after 3pm call: [phone] 06/19/2015, 2:36 AM

## 2015-06-19 NOTE — Procedures (Signed)
Ett advanced 1 cm per order and secured at 22 cm at the lip.

## 2015-06-19 NOTE — Progress Notes (Signed)
Inpatient Diabetes Program Recommendations  AACE/ADA: New Consensus Statement on Inpatient Glycemic Control (2015)  Target Ranges:  Prepandial:   less than 140 mg/dL      Peak postprandial:   less than 180 mg/dL (1-2 hours)      Critically ill patients:  140 - 180 mg/dL   Review of Glycemic Control  Diabetes history: DM2 Outpatient Diabetes medications: Lantus 5 units QHS, glipizide 5 mg QAM Current orders for Inpatient glycemic control: Novolog moderate Q4H  Blood sugars > 180 mg/dL.  Inpatient Diabetes Program Recommendations:  Insulin - Basal: Consider addition of Lantus 10 units QHS Insulin - Meal Coverage: Novolog 3 units Q4H for TF coverage   Will continue to follow. Thank you. Lorenda Peck, RD, LDN, CDE Inpatient Diabetes Coordinator [phone]

## 2015-06-19 NOTE — Progress Notes (Addendum)
Initial Nutrition Assessment  DOCUMENTATION CODES:   Not applicable  INTERVENTION:  Initiate Vital AF 1.2 formula @ 20 ml/hr via PEG and increase by 10 ml every 4 hours to goal rate of 50 ml/hr.   Provide 30 ml Prostat once daily.    Tube feeding regimen provides 1540 kcal (100% of needs), 105 grams of protein, and 972 ml of H2O.   NUTRITION DIAGNOSIS:   Inadequate oral intake related to inability to eat as evidenced by NPO status.  GOAL:   Patient will meet greater than or equal to 90% of their needs  MONITOR:   Vent status, TF tolerance, Weight trends, Labs, I & O's  REASON FOR ASSESSMENT:   Consult Enteral/tube feeding initiation and management  ASSESSMENT:   59 yo female was admitted to [facility] 04/11/15 with 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 She was sent to [facility] and then CIR. She had RLL PE on 06/07/15. She developed altered mental status and acute hypoxic/hypercapnic respiratory failure 06/19/15 and transferred back to ICU.  Patient is currently intubated on ventilator support MV: 9.7 L/min Temp (24hrs), Avg:98.6 F (37 C), Min:96 F (35.6 C), Max:100.7 F (38.2 C)  Pt is familiar to this RD from most recent CIR admission. Per Epic weight records, pt with a 8.4% weight loss in 1 month. In CIR, po intake at meals have been poor thus has been receiving supplementation bolus tube feeds. RD to modify current tube feeds in accordance to change of status.   Pt with no observed significant fat or muscle mass loss.   Labs and medications reviewed.   Diet Order:  Diet NPO time specified  Skin:   (MASD on buttocks)  Last BM:  12/21  Height:   Ht Readings from Last 1 Encounters:  06/19/15 5\' 5"  (1.651 m)    Weight:   Wt Readings from Last 1 Encounters:  06/19/15 174 lb 6.1 oz (79.1 kg)    Ideal Body Weight:  56.8 kg  BMI:  Body mass index is 29.02 kg/(m^2).  Estimated Nutritional Needs:   Kcal:  1588  Protein:  100-115 grams  Fluid:  >/= 1.5  L/day  EDUCATION NEEDS:   No education needs identified at this time  Corrin Parker, MS, RD, LDN Pager # [phone] After hours/ weekend pager # [phone]

## 2015-06-19 NOTE — Progress Notes (Signed)
[place] for Heparin Indication: PE  Allergies  Allergen Reactions  . Codeine Nausea Only    Nausea?-- "made her sick"  . Oxycodone Nausea Only    Sick   . Oxycontin [Oxycodone Hcl] Nausea Only    Sick     Patient Measurements:    Vital Signs: Temp: 96 F (35.6 C) (12/27 0001) Temp Source: Rectal (12/27 0001) BP: 83/52 mmHg (12/27 0040) Pulse Rate: 78 (12/27 0036)  Labs:  Recent Labs  06/16/15 1708 06/17/15 0513 06/18/15 0650 06/19/15 0110  HGB 12.6 13.1 12.0 12.1  HCT 41.8 40.8 38.1 40.0  PLT 310 193 295 311  CREATININE 0.48  --   --  0.89  TROPONINI  --   --   --  <0.03    Estimated Creatinine Clearance: 74.5 mL/min (by C-G formula based on Cr of 0.89).   Medical History: Past Medical History  Diagnosis Date  . Hypertension   . Coronary artery disease   . Diabetes mellitus   . Breast cancer ([place]) 03/10/2007    Left breat  . Degenerative disc disease, cervical   . Degenerative disc disease, lumbar   . Fibromyalgia   . Hyperlipidemia   . Carpal tunnel syndrome on right   . PE (pulmonary embolism)   . UTI (lower urinary tract infection)   . GIB (gastrointestinal bleeding)   . Elevated troponin   . HTN (hypertension) 06/13/2015  . 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    Medications:  Scheduled:  . atorvastatin  40 mg Per Tube q1800  . calcium gluconate  1 g Intravenous Once  . dextrose  50 mL Intravenous Once  . famotidine (PEPCID) IV  20 mg Intravenous Q12H  . fentaNYL (SUBLIMAZE) injection  50 mcg Intravenous Once  . folic acid  1 mg Per Tube Daily  . insulin aspart  0-15 Units Subcutaneous 6 times per day  . insulin aspart  10 Units Intravenous Once  . midazolam      . thiamine  100 mg Per Tube Daily    Assessment: 59 y.o. female with recent PE 12/15, now with VDRF and Xarelto on hold, for heparin.  Last dose of Xarelto at 1915   Goal of Therapy:   APTT 66-102 while Xarelto affecting anti-Xa level Monitor platelets by anticoagulation protocol: Yes   Plan:  Start heparin 1200 units/hr at 0800 APTT 8 hours after starting infusion  Kerry [name], [place] Curb 06/19/2015,2:36 AM

## 2015-06-19 NOTE — Procedures (Signed)
Intubation Procedure Note Kerry [name] [id number] 11/18/1955  Procedure: Intubation Indications: Respiratory insufficiency  Procedure Details Consent: Unable to obtain consent because of altered level of consciousness. Time Out: Verified patient identification, verified procedure, site/side was marked, verified correct patient position, special equipment/implants available, medications/allergies/relevent history reviewed, required imaging and test results available.  Performed  Difficult intubation: Pt had fixed vocal cords with very limited opening. A size 7 ET tube could not be passed after 2 attempts. Ultimately a bougie was passed and a 6.5 ET tube threaded through the vocal cords.  Evaluation Hemodynamic Status: Transient hypotension treated with fluid; O2 sats: transiently fell during during procedure Patient's Current Condition: stable Complications: No apparent complications Patient did tolerate procedure well. Chest X-ray ordered to verify placement.  CXR: tube position low-repostitioned.   Nixie Laube 06/19/2015

## 2015-06-19 NOTE — Progress Notes (Signed)
At beginning of shift from day shift RN, reported that pt had been lethargic much of day, PA/MD aware and it was due to Ultram 50mg  at 0500 and Xanax 0.5mg  0458.  Pt with decreased O2 sat as well as BPs in afternoon, also reported that PA aware.  1920 while getting report, pt very lethargic and unable to be aroused.  At time of assessment, pt not responsive to verbal cues, sternal rub, painful stimuli.  BP 89/52, Resp 39, O2 98% on 2L, pulse 55. Second RN called for assistance, finding best interest to call Rapid.    Call placed to Rapid Response RN [id number], in to assess 2345. Rectal temp gained at that time 96.0. O2 dropped, placed patient on NRB. Rapid placed orders for ABG, lactic acid, Cmet, CBC, CXR, EKG, NS bolus started. Floor on call, Silvestre Mesi PA notified of patient status, PCCM consulted. EKG obtained. Orders received to give Narcan STAT from PCCM, given by this RN (vial did not scan, but was given, verified by second floor RN). ABG obtained by Respiratory. CXR obtained.   [place], PA with PCCM assessed at bedside 0015, obtaining orders to transfer patitnet to ICU. Transferred off unit 0030 with Rapid Response RN, this RN, Respiratory Therapy, and NT following behind with pt belongings heading to 3M10.  Bedside report given.   Attempted call to husband 0100 x2, no answer.  Talked to son [name] [id number] and updated on pt's transfer, appropriate information given, questions answered.

## 2015-06-19 NOTE — Progress Notes (Addendum)
ANTICOAGULATION CONSULT NOTE - Follow Up Consult  Pharmacy Consult for heparin  Indication: PE  Allergies  Allergen Reactions  . Codeine Nausea Only    Nausea?-- "made her sick"  . Oxycodone Nausea Only    Sick   . Oxycontin [Oxycodone Hcl] Nausea Only    Sick     Patient Measurements: Height: 5\' 5"  (165.1 cm) Weight: 174 lb 6.1 oz (79.1 kg) IBW/kg (Calculated) : 57 Heparin Dosing Weight: 74kg  Vital Signs: Temp: 100.3 F (37.9 C) (12/27 1644) Temp Source: Axillary (12/27 1644) BP: 122/53 mmHg (12/27 1645) Pulse Rate: 81 (12/27 1645)  Labs:  Recent Labs  06/18/15 0650 06/19/15 0110 06/19/15 0526 06/19/15 1115 06/19/15 1619  HGB 12.0 12.1 11.5*  --   --   HCT 38.1 40.0 35.9*  --   --   PLT 295 311 227  --   --   APTT  --   --   --   --  50*  HEPARINUNFRC  --   --   --   --  0.71*  CREATININE  --  0.89  --  0.58  --   TROPONINI  --  <0.03  --   --   --     Estimated Creatinine Clearance: 79.6 mL/min (by C-G formula based on Cr of 0.58).   Medications:  Scheduled:  . antiseptic oral rinse  7 mL Mouth Rinse QID  . cefTAZidime (FORTAZ)  IV  1 g Intravenous Q8H  . chlorhexidine gluconate  15 mL Mouth Rinse BID  . feeding supplement (PRO-STAT SUGAR FREE 64)  30 mL Per Tube Daily  . folic acid  1 mg Per Tube Daily  . insulin aspart  0-15 Units Subcutaneous 6 times per day  . pantoprazole sodium  40 mg Per Tube Q24H  . thiamine  100 mg Per Tube Daily  . vancomycin  1,000 mg Intravenous Q12H   Infusions:  . sodium chloride 100 mL/hr at 06/19/15 0800  . feeding supplement (VITAL AF 1.2 CAL) 1,000 mL (06/19/15 1600)  . fentaNYL infusion INTRAVENOUS 50 mcg/hr (06/19/15 1600)  . heparin 1,200 Units/hr (06/19/15 1600)  . phenylephrine (NEO-SYNEPHRINE) Adult infusion Stopped (06/19/15 1200)    Assessment: 59 yo female with recent PE (06/07/15) and xarelto on hold (last dose ~ 7pm 06/18/15 on the Rehab unit).  She is noted with VDRF now on heparin -heparin level=  0.71, aPTT= 50 -possible influence of xarelto on heparin level since aPTT is below goal  Goal of Therapy:  Heparin level 0.3-0.7 units/ml aPTT 66-102 seconds Monitor platelets by anticoagulation protocol: Yes   Plan:  -Increase heparin to 1350 units/hr -aPTTl in 6 hours and daily wth CBC daily -Heparin level daily for now  Hildred Laser, Pharm D 06/19/2015 6:16 PM

## 2015-06-19 NOTE — Progress Notes (Signed)
CRITICAL VALUE ALERT  Critical value received:  Potassium 6.3  Date of notification:  06/19/15  Time of notification:  0230  Critical value read back:Yes.    Nurse who received alert:  Therese Sarah   MD notified (1st page):  Montey Hora  Time of first page:  0234  MD notified (2nd page):  Time of second page:  Responding MD:  Montey Hora; Orders received.  Time MD responded:  [id number]

## 2015-06-19 NOTE — Progress Notes (Signed)
[place] Progress Note Patient Name: Kerry [name] DOB: 1955/11/07 MRN: [id number]   Date of Service  06/19/2015  HPI/Events of Note  Patient with some agitation reaching for TUbe Ct head reports:Small low-attenuation area in the left pons concerning for an acute left pontine infarct. 2. Old left MCA territory infarct.  eICU Interventions  Wrist restraints orders placed Neurology consulted-routine, most likley nothing to do at this time but to avoid secondary brain injury(correct ltyes, avoid hypoxia and swings in BP, glucose stabilization)        Adra Shepler 06/19/2015, 4:02 PM

## 2015-06-19 NOTE — Progress Notes (Signed)
[place] Progress Note Patient Name: Kerry [name] DOB: June 06, 1956 MRN: [id number]   Date of Service  06/19/2015  HPI/Events of Note  Patient with persistent elevated LA 3.3  eICU Interventions  Will give 500 Ml saline bolus and continue IVF's as prescribed, follow up LA as ordered        Shanen Norris 06/19/2015, 4:27 PM

## 2015-06-19 NOTE — Progress Notes (Signed)
CRITICAL VALUE ALERT  Critical value received:  Lactic acid 3.2  Date of notification:  06/19/15  Time of notification:  1300  Critical value read back: yes  Nurse who received alert:  Dulcy Fanny, RN  MD notified (1st page):  Dr. Ashok Cordia  Time of first page:  1330  MD notified (2nd page):  Time of second page:  Responding MD:  Dr. Ashok Cordia  Time MD responded:  [phone]

## 2015-06-19 NOTE — Progress Notes (Addendum)
Transported pt to and from 3M10 to CT1 on ventilator. Pt stable throughout with no complications. RT will continue to monitor.

## 2015-06-19 NOTE — Procedures (Signed)
Ett pulled back 3 cm from 26 at the lip to 23 at the lip by charge RT per order.  After chest x-ray order given to pull Ett pulled back another 2 cm from 23 at lip to 21.  Order completed and a waiting x-ray for identification of proper tube placement.

## 2015-06-19 NOTE — Care Management Note (Signed)
Case Management Note  Patient Details  Name: Kerry [name] MRN: [id number] Date of Birth: 10/03/1955  Subjective/Objective:   Pt admitted on 06/19/15 with respiratory failure.  PTA, pt was receiving rehab services at [facility].  She has supportive family.                  Action/Plan: Will follow for discharge planning as pt progresses.    Expected Discharge Date:                  Expected Discharge Plan:  IP Rehab Facility  In-House Referral:  Clinical Social Work  Discharge planning Services  CM Consult  Post Acute Care Choice:    Choice offered to:     DME Arranged:    DME Agency:     HH Arranged:    [place] Agency:     Status of Service:  In process, will continue to follow  Medicare Important Message Given:    Date Medicare IM Given:    Medicare IM give by:    Date Additional Medicare IM Given:    Additional Medicare Important Message give by:     If discussed at [place] of Stay Meetings, dates discussed:    Additional Comments:  Reinaldo Raddle, RN, BSN  Trauma/Neuro ICU Case Manager [phone]

## 2015-06-19 NOTE — Consult Note (Signed)
PULMONARY / CRITICAL CARE MEDICINE   Name: Kerry [name] MRN: [id number] DOB: December 03, 1955    ADMISSION DATE:  06/19/2015  REFERRING MD:  CIR  CHIEF COMPLAINT: Altered mental status  SUBJECTIVE:  Remains on vent.  Pressors resumed.  Low grade temp.  VITAL SIGNS: BP 103/66 mmHg  Pulse 87  Temp(Src) 100.2 F (37.9 C) (Axillary)  Resp 14  Ht 5\' 5"  (1.651 m)  Wt 174 lb 6.1 oz (79.1 kg)  BMI 29.02 kg/m2  SpO2 100%  HEMODYNAMICS:    VENTILATOR SETTINGS: Vent Mode:  [-] PRVC FiO2 (%):  [40 %-60 %] 40 % Set Rate:  [14 bmp-30 bmp] 14 bmp Vt Set:  [440 mL-550 mL] 550 mL PEEP:  [5 cmH20] 5 cmH20 Plateau Pressure:  [18 cmH20] 18 cmH20  INTAKE / OUTPUT: I/O last 3 completed shifts: In: 457.5 [I.V.:307.5; IV Piggyback:150] Out: 850 [Urine:850]  PHYSICAL EXAMINATION: General: ill appearing Neuro:  RASS -1, opens eyes with stimulation HEENT:  Pupils reactive Cardiovascular:  Regular, no murmur Lungs:  No wheeze Abdomen:  Soft, non tender Musculoskeletal:  No edema Skin:  No rashes  LABS:  BMET  Recent Labs Lab 06/14/15 0555 06/16/15 1708 06/19/15 0110  NA 138 139 137  K 3.8 4.5 6.3*  CL 98* 97* 96*  CO2 30 34* 30  BUN 7 28* 42*  CREATININE 0.32* 0.48 0.89  GLUCOSE 191* 231* 384*    Electrolytes  Recent Labs Lab 06/14/15 0555 06/16/15 1708 06/19/15 0110 06/19/15 0526  CALCIUM 9.1 9.6 9.0  --   MG  --   --   --  1.9  PHOS  --   --   --  4.1    CBC  Recent Labs Lab 06/18/15 0650 06/19/15 0110 06/19/15 0526  WBC 12.4* 16.7* 14.1*  HGB 12.0 12.1 11.5*  HCT 38.1 40.0 35.9*  PLT 295 311 227    Coag's No results for input(s): APTT, INR in the last 168 hours.  Sepsis Markers  Recent Labs Lab 06/16/15 1708 06/19/15 0110  LATICACIDVEN 1.1 1.4    ABG  Recent Labs Lab 06/19/15 0005 06/19/15 0445  PHART 7.019* 7.457*  PCO2ART 157* 43.1  PO2ART 283* 111*    Liver Enzymes  Recent Labs Lab 06/14/15 0555 06/19/15 0110  AST  20 23  ALT 24 23  ALKPHOS 52 55  BILITOT 0.4 0.7  ALBUMIN 2.9* 3.1*    Cardiac Enzymes  Recent Labs Lab 06/19/15 0110  TROPONINI <0.03    Glucose  Recent Labs Lab 06/17/15 2211 06/18/15 0637 06/18/15 1117 06/18/15 1634 06/18/15 2123 06/19/15 0759  GLUCAP 255* 186* 244* 291* 393* 283*    Imaging Dg Chest Port 1 View  06/19/2015  CLINICAL DATA:  59 year old female status post adjustment of the endotracheal tube. EXAM: PORTABLE CHEST 1 VIEW COMPARISON:  Earlier chest radiograph 06/19/2015 FINDINGS: There has been interval retraction of the endotracheal tube with tip female approximately 5 cm above the carina. There has been interval removal of the enteric tube. No significant interval change in the appearance of the lungs. Pleural effusion or pneumothorax. Stable cardiac silhouette. The osseous structures appear unremarkable. IMPRESSION: Interval retraction of the endotracheal tube with tip now approximately 5 cm above the carina. Electronically Signed   By: Anner Crete M.D.   On: 06/19/2015 02:45   Dg Chest Port 1 View  06/19/2015  CLINICAL DATA:  Endotracheal tube repositioning.  Initial encounter. EXAM: PORTABLE CHEST 1 VIEW COMPARISON:  Chest radiograph performed earlier today at  1:23 a.m. FINDINGS: The patient's endotracheal tube is seen ending 1-2 cm above the carina. This could be retracted 1-2 cm, as deemed clinically appropriate. The enteric tube is seen extending below the diaphragm. Right basilar airspace opacity appears improved, and may reflect atelectasis or possibly mild pneumonia. No definite pleural effusion or pneumothorax is seen. The cardiomediastinal silhouette is normal in size. No acute osseous abnormalities are identified. External pacing pads are noted. IMPRESSION: 1. Endotracheal tube seen ending 1-2 cm above the carina. This could be retracted 1-2 cm, as deemed clinically appropriate. 2. Right basilar airspace opacity appears improved, and may reflect  atelectasis or possibly mild pneumonia. These results were called by telephone at the time of interpretation on 06/19/2015 at 1:58 am to [facility] on [facility], who verbally acknowledged these results. Electronically Signed   By: Garald Balding M.D.   On: 06/19/2015 02:04   Dg Chest Port 1 View  06/19/2015  CLINICAL DATA:  Endotracheal tube placement.  Initial encounter. EXAM: PORTABLE CHEST 1 VIEW COMPARISON:  None. FINDINGS: The patient's endotracheal tube is seen ending just above the carina. This should be retracted 2-3 cm. The enteric tube is noted extending below the diaphragm. The lungs are well-aerated. Right basilar airspace opacity may reflect atelectasis or pneumonia. A small right pleural effusion is suspected. No pneumothorax is seen. The cardiomediastinal silhouette is within normal limits. No acute osseous abnormalities are seen. External pacing pads are noted. IMPRESSION: 1. Endotracheal tube seen ending just above the carina. This should be retracted 3 cm. 2. Right basilar airspace opacity may reflect atelectasis or pneumonia. Small right pleural effusion suspected. These results were called by telephone at the time of interpretation on 06/19/2015 at 1:58 am to [facility], who verbally acknowledged these results. Electronically Signed   By: Garald Balding M.D.   On: 06/19/2015 02:02   Dg Chest Port 1 View  06/19/2015  CLINICAL DATA:  59 year old female with shortness of breath EXAM: PORTABLE CHEST 1 VIEW COMPARISON:  Radiograph dated 06/18/2015 FINDINGS: The patient is tilted to the right. There is stable cardiomegaly. Bibasilar linear atelectatic changes/scarring noted. There is no focal consolidation, pleural effusion, or pneumothorax. There is degenerative changes of the spine. No acute fracture. IMPRESSION: Stable cardiomegaly.  No acute cardiopulmonary process. Electronically Signed   By: Anner Crete M.D.   On: 06/19/2015 00:41   Dg Abd Portable 1v  06/19/2015  CLINICAL  DATA:  Orogastric tube placement.  Initial encounter. EXAM: PORTABLE ABDOMEN - 1 VIEW COMPARISON:  Abdominal radiograph performed 06/04/2015 FINDINGS: The patient's enteric tube is noted ending overlying the body of the stomach. The endotracheal tube is noted ending just above the carina. This should be retracted 2-3 cm. The visualized bowel gas pattern is difficult to fully assess due to motion artifact, though contrast is seen within the colon. Right basilar airspace opacity is noted. No pleural effusion is seen. No acute osseous abnormalities are identified. External pacing pads are noted. IMPRESSION: 1. Enteric tube noted ending overlying the body of the stomach. 2. Endotracheal tube seen ending just above the carina. This should be retracted 2-3 cm. 3. Right basilar airspace opacity noted. These results were called by telephone at the time of interpretation on 06/19/2015 at 1:58 am to [facility] on [facility], who verbally acknowledged these results. Electronically Signed   By: Garald Balding M.D.   On: 06/19/2015 02:00     STUDIES:  12/14 CT chest >> RLL PE 12/15 Doppler Lt leg >> DVT  Rt calf 12/16 Laryngoscopy >> vocal fold paresis/hypomobility 12/17 CT head >> Lt MCA CVA  CULTURES: 12/27 Blood >> 12/27 Urine >> 12/27 Sputum >>  ANTIBIOTICS: 12/27 Vancomycin >> 12/27 Tressie Ellis  SIGNIFICANT EVENTS: 12/27 Transfer to ICU  LINES/TUBES: 12/27 ETT >>  DISCUSSION: 59 yo female was admitted to [facility] 04/11/15 with 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  She was sent to [facility] and then CIR.  She had RLL PE on 06/07/15.  She developed altered mental status and acute hypoxic/hypercapnic respiratory failure 06/19/15 and transferred back to ICU.  Was difficult intubation.  ASSESSMENT / PLAN:  PULMONARY A: Acute hypoxic/hypercapnic respiratory failure. RLL PE, Lt leg DVT. Vocal cord paresis. P:   Full vent support for now F/u CXR, ABG Heparin gtt per pharmacy  CARDIOVASCULAR A:  Sepsis. Hx of  CAD, HTN, HLD. P:  Continue IV fluids Might need CVL if unable to wean off pressors Hold lipitor, coreg, prinivil for now  RENAL A:   Hyperkalemia likely from acidosis. P:   F/u BMET  GASTROINTESTINAL A:   Dysphagia. P:   Tube feeds if unable to extubate soon Pepcid for SUP  HEMATOLOGIC A:   No acute issues. P:  F/u CBC  INFECTIOUS A:   Sepsis >> source not clear. P:   Day 1 vancomycin, fortaz F/u lactic acid, procalcitonin  ENDOCRINE A:   DM. P:   SSI Hold lantus, glipizide  NEUROLOGIC A:   Acute encephalopathy 2nd to respiratory failure >> not clear what triggered respiratory failure. Recent Lt MCA CVA. P:   RASS goal: 0 F/u CT head, EEG   Updated pt's family at bedside.  CC time 38 minutes.  Chesley Mires, MD [facility] Pulmonary/Critical Care 06/19/2015, 9:21 AM Pager:  [phone] After 3pm call: [phone]

## 2015-06-19 NOTE — Progress Notes (Signed)
PCCM Interval Progress Note  Events:  Hyperkalemia (6.3).  Interventions:  STAT EKG.  1g Ca gluconate, 1amp D50, 10u insulin.  Repeat BMP in 2 hours.   Montey Hora, [facility] Pager: [phone]  or [phone] 06/19/2015, 2:35 AM

## 2015-06-19 NOTE — Progress Notes (Signed)
Patient arrived from 4W unresponsive on non rebreather at 15 L. Sats mid 90s. MD Mannam and Karen Kays, PA at bedside preparing to intubate patient. Patient hypotensive, 1L NS ordered and administered. Prior to intubation Etomidate 20 mg, Fentanyl 100 mcg, Versed 2 mg and Rocuronium 50 mg administered per MD Mannam order. Mouth cleaned with CHG prior to intubation. Patient still hypotensive, Neosynephrine ordered and administered. Family updated and brought to bedside. Will continue to monitor.

## 2015-06-19 NOTE — Consult Note (Signed)
PULMONARY / CRITICAL CARE MEDICINE   Name: Kerry [name] MRN: [id number] DOB: 1956-03-31    ADMISSION DATE:  06/19/2015 CONSULTATION DATE:  06/19/2015  REFERRING MD:  CIR  CHIEF COMPLAINT:  Unresponsiveness  HISTORY OF PRESENT ILLNESS:  Pt is encephelopathic; therefore, this HPI is obtained from chart review. Kerry [name] is a 59 y.o. female with PMH as outlined below. 59 y.o. female Initially admitted to [facility] 10/19 with L MCA infarct. She had stenting and embolectomy by IR and was later discharged to [facility] before being admitted to [facility] 11/23. On 12/15, she had tachycardia and hypotension and was found to have small RLL PE. PCCM called for recs. She was given volume and responded appropriately.    12/27, PCCM called back early AM due to unresponsiveness and hypoxia.  ABG revealed hypercarbic respiratory failure.  She was transferred to the ICU and required intubation.   PAST MEDICAL HISTORY :  She  has a past medical history of Hypertension; Coronary artery disease; Diabetes mellitus; Breast cancer ([place]) (03/10/2007); Degenerative disc disease, cervical; Degenerative disc disease, lumbar; Fibromyalgia; Hyperlipidemia; Carpal tunnel syndrome on right; PE (pulmonary embolism); UTI (lower urinary tract infection); GIB (gastrointestinal bleeding); Elevated troponin; and HTN (hypertension) (06/13/2015).  PAST SURGICAL HISTORY: She  has past surgical history that includes Cardiac surgery; Appendectomy; Breast surgery; Mastectomy (Left, 03/2007); Cardiac catheterization (2005); Vein Surgery (Right, 2005); Tubal ligation (Right); Radiology with anesthesia (N/A, 04/11/2015); Cardiac catheterization (N/A, 04/13/2015); PEG placement (N/A, 04/18/2015); and Esophagogastroduodenoscopy (N/A, 04/18/2015).  Allergies  Allergen Reactions  . Codeine Nausea Only    Nausea?-- "made her sick"  . Oxycodone Nausea Only    Sick   . Oxycontin [Oxycodone Hcl] Nausea Only    Sick     Current  Facility-Administered Medications on File Prior to Encounter  Medication  . phenylephrine (NEO-SYNEPHRINE) 10 mg in dextrose 5 % 250 mL (0.04 mg/mL) infusion   Current Outpatient Prescriptions on File Prior to Encounter  Medication Sig  . acetaminophen (TYLENOL) 160 MG/5ML solution Take 10.2-20.3 mLs (325-650 mg total) by mouth every 4 (four) hours as needed for mild pain.  Marland Kitchen atorvastatin (LIPITOR) 40 MG tablet Take 1 tablet (40 mg total) by mouth daily at 6 PM.  . carvedilol (COREG) 12.5 MG tablet Take 1 tablet (12.5 mg total) by mouth 2 (two) times daily with a meal.  . feeding supplement, ENSURE, (ENSURE) PUDG Take 1 Container by mouth 3 (three) times daily between meals.  . folic acid (FOLVITE) 1 MG tablet Take 1 mg by mouth daily.  Marland Kitchen glipiZIDE (GLUCOTROL) 5 MG tablet Take by mouth daily before breakfast.  . hydrocerin (EUCERIN) CREA Apply 1 application topically 2 (two) times daily. To bilateral feet  . Insulin Glargine (LANTUS) 100 UNIT/ML Solostar Pen Inject 5 Units into the skin daily at 10 pm.  . Insulin Pen Needle 32G X 4 MM MISC Use with insulin pen to dispense insulin daily  . lisinopril (PRINIVIL,ZESTRIL) 10 MG tablet Take 1 tablet (10 mg total) by mouth daily.  . Melatonin 3 MG TABS Take 6 mg by mouth at bedtime.  . Multiple Vitamins-Minerals (MULTIVITAMINS THER. W/MINERALS) TABS tablet Take 1 tablet by mouth daily.  . Nutritional Supplements (FEEDING SUPPLEMENT, VITAL AF 1.2 CAL,) LIQD 1 can three times daily; flush with 200 cc water after can is given  . Rivaroxaban (XARELTO) 15 MG TABS tablet Take 1 tablet (15 mg total) by mouth 2 (two) times daily with a meal. X 16 days  . [START ON  06/30/2015] rivaroxaban (XARELTO) 20 MG TABS tablet Take 1 tablet (20 mg total) by mouth daily with supper.  . senna-docusate (SENNA PLUS) 8.6-50 MG tablet Take 1 tablet by mouth 2 (two) times daily as needed for mild constipation.  . sodium chloride (OCEAN) 0.65 % SOLN nasal spray Place 1 spray into  both nostrils 3 (three) times daily. To help with nose bleeds  . thiamine 100 MG tablet Take 100 mg by mouth daily.  . Water For Irrigation, Sterile (FREE WATER) SOLN Place 200 mLs into feeding tube every 8 (eight) hours.  Marland Kitchen zinc sulfate 220 MG capsule Take 220 mg by mouth daily at 6 PM.    FAMILY HISTORY:  Her indicated that her mother is alive. She indicated that her father is alive. She indicated that only one of her two maternal aunts is alive. She indicated that her cousin is alive.   SOCIAL HISTORY: She  reports that she has been smoking.  She has never used smokeless tobacco. She reports that she drinks alcohol. She reports that she does not use illicit drugs.  REVIEW OF SYSTEMS: Unable to obtain as pt is encephalopathic.  SUBJECTIVE:  Completely unresponsive.  VITAL SIGNS: There were no vitals taken for this visit.  HEMODYNAMICS:    VENTILATOR SETTINGS:    INTAKE / OUTPUT:     PHYSICAL EXAMINATION: General: Adult female, completely unresponsive. Neuro: Completely unresponsive.  GCS 3. HEENT: [place]/AT. PERRL.  Old trach site noted. Cardiovascular: RRR, no M/R/G.  Lungs: Respirations shallow and labored.  Clear bilaterally. Abdomen: BS x 4, soft, NT/ND.  PEG in place. Musculoskeletal: No gross deformities, no edema.  Skin: Intact, warm, no rashes.  LABS:  BMET  Recent Labs Lab 06/12/15 0640 06/14/15 0555 06/16/15 1708  NA 141 138 139  K 3.9 3.8 4.5  CL 101 98* 97*  CO2 31 30 34*  BUN 8 7 28*  CREATININE 0.32* 0.32* 0.48  GLUCOSE 188* 191* 231*    Electrolytes  Recent Labs Lab 06/12/15 0640 06/14/15 0555 06/16/15 1708  CALCIUM 9.4 9.1 9.6  MG 1.9  --   --     CBC  Recent Labs Lab 06/16/15 1708 06/17/15 0513 06/18/15 0650  WBC 12.7* 12.3* 12.4*  HGB 12.6 13.1 12.0  HCT 41.8 40.8 38.1  PLT 310 193 295    Coag's No results for input(s): APTT, INR in the last 168 hours.  Sepsis Markers  Recent Labs Lab 06/16/15 1708  LATICACIDVEN  1.1    ABG  Recent Labs Lab 06/19/15 0005  PHART 7.019*  PCO2ART 157*  PO2ART 283*    Liver Enzymes  Recent Labs Lab 06/14/15 0555  AST 20  ALT 24  ALKPHOS 52  BILITOT 0.4  ALBUMIN 2.9*    Cardiac Enzymes No results for input(s): TROPONINI, PROBNP in the last 168 hours.  Glucose  Recent Labs Lab 06/17/15 1622 06/17/15 2211 06/18/15 0637 06/18/15 1117 06/18/15 1634 06/18/15 2123  GLUCAP 208* 255* 186* 244* 291* 393*    Imaging Dg Chest Port 1 View  06/19/2015  CLINICAL DATA:  59 year old female with shortness of breath EXAM: PORTABLE CHEST 1 VIEW COMPARISON:  Radiograph dated 06/18/2015 FINDINGS: The patient is tilted to the right. There is stable cardiomegaly. Bibasilar linear atelectatic changes/scarring noted. There is no focal consolidation, pleural effusion, or pneumothorax. There is degenerative changes of the spine. No acute fracture. IMPRESSION: Stable cardiomegaly.  No acute cardiopulmonary process. Electronically Signed   By: Anner Crete M.D.   On: 06/19/2015  00:41   Dg Chest Port 1 View  06/18/2015  CLINICAL DATA:  Acute onset of shortness of breath on exertion. Initial encounter. EXAM: PORTABLE CHEST 1 VIEW COMPARISON:  Chest radiograph performed 06/16/2015 FINDINGS: The lungs are well-aerated. Mild bibasilar atelectasis is noted. There is no evidence of pleural effusion or pneumothorax. The cardiomediastinal silhouette is enlarged. No acute osseous abnormalities are seen. IMPRESSION: Mild bibasilar atelectasis noted.  Cardiomegaly noted. Electronically Signed   By: Garald Balding M.D.   On: 06/18/2015 04:44     STUDIES:  CTA chest 12/14 > subsegmental RLL PE. LE duplex 12/15 > non occlusive DVT in R CVF. 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 CT head 12/17 > normal changes of large left MCA infarct.  CULTURES: None.  ANTIBIOTICS: None.  SIGNIFICANT EVENTS: 10/19 > admitted with L MCA infarct. 10/26 > trach /  PEG. 11/4 > discharged to [facility]. 11/23 > admitted to CIR. 12/15 > tachycardia and hypotension > small RLL subsegmental PE found > PCCM consulted. Started on heparin and later transitioned to xarelto (12/19). Transferred to SDU under [place]. 12/16 > ENT consult > had laryngoscopy that showed vocal fold paresis / hypomobility (recommended considering outpatient referral to laryngologist at [facility] center to consider laser arytenoidectomy / partial cordotomy). 12/20 > discharged and admitted back to CIR. 12/22 > SLP eval. Recommended dysphagia 2 diet. 12/24 > respiratory distress. No new interventions. 12/27 > unresponsive when RN checked on pt. Rapid response and PCCM called. Transferred to ICU and intubated.   LINES/TUBES: ETT 12/27 >   ASSESSMENT / PLAN:  PULMONARY A: Acute on chronic hypercarbic and hypoxic respiratory failure. Known small RLL PE - has been on xarelto. Known vocal cord paresis / hypomobility - as seen on laryngoscopy by ENT 12/16 who recommended considering outpatient referral to laryngologist at [facility] center to consider laser arytenoidectomy / partial cordotomy. Hx trach last admission (10/26/ unknown when she was decannulated). DIFFICULT INTUBATION!!! P:   Full vent support. Would be extremely cautions with extubation given her difficult airway, consider trach (she had grade 2 view with glidescope; however, cords almost completely shut making it very difficult to pass ETT through). VAP prevention measures. Heparin gtt in lieu of xarelto incase of any procedures. CXR in AM.  CARDIOVASCULAR A:  Hypotension - exacerbated by sedation during intubation; Now resolving. Hx HTN, CAD, HLD. P:  Continue neosynephrine as needed to maintain goal MAP > 65. Continue outpatient atorvastatin. Hold coreg, lisinopril. Assess troponin, lactate.  RENAL A:   No acute issues. P:   NS @ 100. Assess CMP now and BMP in AM.  GASTROINTESTINAL A:   GI  prophylaxis. Dysphagia - s/p PEG placement 10/26. Nutrition. P:   SUP: Famotidine. Nutrition consult for TF's.  HEMATOLOGIC A:   Known PE - currently on xarelto.  LE duplex did also reveal  DVT in right CFV. VTE Prophylaxis. P:  Heparin gtt in lieu of xarelto. SCD's left leg only. CBC now and in AM.  INFECTIOUS A:   No indication of infection. Hx E.Coli UTI - s/p ancef. P:   Monitor clinically.  ENDOCRINE A:   DM.  P:   SSI. Hold lantus, glipizide.  NEUROLOGIC A:   Known large left MCA infarct with small hemorrhagic conversion following tPA administration. P:   Sedation:  Fentanyl gtt / Midazolam PRN. RASS goal: 0 to -1. Daily WUA.   Family updated: Husband over phone.  He will be coming to hospital soon.  Interdisciplinary Family Meeting v  Palliative Care Meeting:  Due by: 06/25/15.   Montey Hora, [place] [facility] Pager: [phone]  or [phone] 06/19/2015, 1:03 AM

## 2015-06-19 NOTE — Progress Notes (Signed)
Will update Shanda Bumps regarding pt transferring to acute, family's plan was to take pt home after rehab and provide 24 hr care. Equipment has been delivered to the home and follow up arranged via [facility].

## 2015-06-19 NOTE — Progress Notes (Signed)
CRITICAL VALUE ALERT  Critical value received:  Lactic Acid 3.3  Date of notification:  06/19/15  Time of notification:  1600  Critical value read back: yes  Nurse who received alert:  Dulcy Fanny, RN  MD notified (1st page):  Dr. Mortimer Fries  Time of first page:  1605 (Called elink)  MD notified (2nd page):  Time of second page:  Responding MD:  Dr. Mortimer Fries  Time MD responded:  [phone]

## 2015-06-20 ENCOUNTER — Inpatient Hospital Stay (HOSPITAL_COMMUNITY): Payer: BLUE CROSS/BLUE SHIELD

## 2015-06-20 DIAGNOSIS — Z8673 Personal history of transient ischemic attack (TIA), and cerebral infarction without residual deficits: Secondary | ICD-10-CM

## 2015-06-20 DIAGNOSIS — R930 Abnormal findings on diagnostic imaging of skull and head, not elsewhere classified: Secondary | ICD-10-CM

## 2015-06-20 LAB — BLOOD GAS, ARTERIAL
ACID-BASE EXCESS: 7.7 mmol/L — AB (ref 0.0–2.0)
Acid-Base Excess: 4.7 mmol/L — ABNORMAL HIGH (ref 0.0–2.0)
BICARBONATE: 38.6 meq/L — AB (ref 20.0–24.0)
Bicarbonate: 28.1 mEq/L — ABNORMAL HIGH (ref 20.0–24.0)
DRAWN BY: 42624
Drawn by: 398661
FIO2: 0.3
FIO2: 1
LHR: 14 {breaths}/min
MECHVT: 550 mL
O2 Saturation: 98.8 %
O2 Saturation: 99.4 %
PATIENT TEMPERATURE: 98.6
PCO2 ART: 157 mmHg — AB (ref 35.0–45.0)
PEEP: 5 cmH2O
PH ART: 7.019 — AB (ref 7.350–7.450)
PO2 ART: 111 mmHg — AB (ref 80.0–100.0)
Patient temperature: 98.6
TCO2: 29.3 mmol/L (ref 0–100)
TCO2: 43.4 mmol/L (ref 0–100)
pCO2 arterial: 37.8 mmHg (ref 35.0–45.0)
pH, Arterial: 7.485 — ABNORMAL HIGH (ref 7.350–7.450)
pO2, Arterial: 283 mmHg — ABNORMAL HIGH (ref 80.0–100.0)

## 2015-06-20 LAB — GLUCOSE, CAPILLARY
GLUCOSE-CAPILLARY: 191 mg/dL — AB (ref 65–99)
GLUCOSE-CAPILLARY: 176 mg/dL — AB (ref 65–99)
Glucose-Capillary: 213 mg/dL — ABNORMAL HIGH (ref 65–99)
Glucose-Capillary: 254 mg/dL — ABNORMAL HIGH (ref 65–99)
Glucose-Capillary: 191 mg/dL — ABNORMAL HIGH (ref 65–99)

## 2015-06-20 LAB — CBC
HCT: 29.3 % — ABNORMAL LOW (ref 36.0–46.0)
HEMOGLOBIN: 9.3 g/dL — AB (ref 12.0–15.0)
MCH: 28.1 pg (ref 26.0–34.0)
MCHC: 31.7 g/dL (ref 30.0–36.0)
MCV: 88.5 fL (ref 78.0–100.0)
Platelets: 154 10*3/uL (ref 150–400)
RBC: 3.31 MIL/uL — AB (ref 3.87–5.11)
RDW: 15.3 % (ref 11.5–15.5)
WBC: 9.7 10*3/uL (ref 4.0–10.5)

## 2015-06-20 LAB — BASIC METABOLIC PANEL
ANION GAP: 9 (ref 5–15)
BUN: 23 mg/dL — ABNORMAL HIGH (ref 6–20)
CHLORIDE: 102 mmol/L (ref 101–111)
CO2: 28 mmol/L (ref 22–32)
CREATININE: 0.45 mg/dL (ref 0.44–1.00)
Calcium: 8.6 mg/dL — ABNORMAL LOW (ref 8.9–10.3)
GFR calc non Af Amer: >60 mL/min (ref >60)
Glucose, Bld: 216 mg/dL — ABNORMAL HIGH (ref 65–99)
POTASSIUM: 2.9 mmol/L — AB (ref 3.5–5.1)
SODIUM: 139 mmol/L (ref 135–145)

## 2015-06-20 LAB — PHOSPHORUS: PHOSPHORUS: 1.4 mg/dL — AB (ref 2.5–4.6)

## 2015-06-20 LAB — APTT
APTT: 68 s — AB (ref 24–37)
APTT: 68 s — AB (ref 24–37)

## 2015-06-20 LAB — HEPARIN LEVEL (UNFRACTIONATED): Heparin Unfractionated: 0.61 IU/mL (ref 0.30–0.70)

## 2015-06-20 LAB — MAGNESIUM: MAGNESIUM: 1.7 mg/dL (ref 1.7–2.4)

## 2015-06-20 LAB — LACTIC ACID, PLASMA: Lactic Acid, Venous: 1 mmol/L (ref 0.5–2.0)

## 2015-06-20 LAB — PROCALCITONIN: Procalcitonin: <0.1 ng/mL

## 2015-06-20 MED ORDER — POTASSIUM CHLORIDE 20 MEQ/15ML (10%) PO SOLN
40.0000 meq | ORAL | Status: AC
  Administered 2015-06-20 (×2): 40 meq
  Filled 2015-06-20 (×2): qty 30

## 2015-06-20 MED ORDER — ATORVASTATIN CALCIUM 40 MG PO TABS: 40.0000 mg | ORAL_TABLET | Freq: Every day | ORAL | Status: DC

## 2015-06-20 MED ORDER — ATORVASTATIN CALCIUM 40 MG PO TABS
40.0000 mg | ORAL_TABLET | Freq: Every day | ORAL | Status: DC
  Administered 2015-06-20 – 2015-07-05 (×16): 40 mg
  Filled 2015-06-20 (×16): qty 1

## 2015-06-20 NOTE — Progress Notes (Signed)
[place] for Heparin Indication: PE  Allergies  Allergen Reactions  . Codeine Nausea Only    Nausea?-- "made her sick"  . Oxycodone Nausea Only    Sick   . Oxycontin [Oxycodone Hcl] Nausea Only    Sick     Patient Measurements: Height: 5\' 5"  (165.1 cm) Weight: 178 lb 9.2 oz (81 kg) IBW/kg (Calculated) : 57  Vital Signs: Temp: 99.5 F (37.5 C) (12/28 0000) Temp Source: Axillary (12/28 0000) BP: 130/58 mmHg (12/28 0100) Pulse Rate: 78 (12/28 0100)  Labs:  Recent Labs  06/18/15 0650 06/19/15 0110 06/19/15 0526 06/19/15 1115 06/19/15 1619 06/20/15 0043  HGB 12.0 12.1 11.5*  --   --   --   HCT 38.1 40.0 35.9*  --   --   --   PLT 295 311 227  --   --   --   APTT  --   --   --   --  50* 68*  HEPARINUNFRC  --   --   --   --  0.71*  --   CREATININE  --  0.89  --  0.58  --   --   TROPONINI  --  <0.03  --   --   --   --     Estimated Creatinine Clearance: 80.6 mL/min (by C-G formula based on Cr of 0.58).  Assessment: 59 y.o. female with recent PE 12/15, now with VDRF and Xarelto on hold, for heparin.     Goal of Therapy:  APTT 66-102 while Xarelto affecting anti-Xa level Monitor platelets by anticoagulation protocol: Yes   Plan:  Continue Heparin at current rate  Follow-up am labs.   Curly Mackowski, [place] Curb 06/20/2015,1:37 AM

## 2015-06-20 NOTE — Care Management Note (Signed)
Case Management Note  Patient Details  Name: Kerry [name] MRN: [id number] Date of Birth: 1956-01-16  Subjective/Objective:    Pt admitted on 06/19/15 with respiratory failure.  PTA, pt receiving rehab services at [facility].  She has supportive husband and sons.                  Action/Plan: Will follow for discharge planning as pt progresses; currently remains intubated.  Will need PT/OT consults when able to to tolerate therapies.    Expected Discharge Date:                  Expected Discharge Plan:  IP Rehab Facility  In-House Referral:  Clinical Social Work  Discharge planning Services  CM Consult  Post Acute Care Choice:    Choice offered to:     DME Arranged:    DME Agency:     HH Arranged:    [place] Agency:     Status of Service:  In process, will continue to follow  Medicare Important Message Given:    Date Medicare IM Given:    Medicare IM give by:    Date Additional Medicare IM Given:    Additional Medicare Important Message give by:     If discussed at [place] of Stay Meetings, dates discussed:    Additional Comments:  Reinaldo Raddle, RN, BSN  Trauma/Neuro ICU Case Manager [phone]

## 2015-06-20 NOTE — Progress Notes (Signed)
ANTICOAGULATION CONSULT NOTE - Follow Up Consult  Pharmacy Consult for heparin Indication: pulmonary embolus  Allergies  Allergen Reactions  . Codeine Nausea Only    Nausea?-- "made her sick"  . Oxycodone Nausea Only    Sick   . Oxycontin [Oxycodone Hcl] Nausea Only    Sick     Patient Measurements: Height: 5\' 5"  (165.1 cm) Weight: 178 lb 9.2 oz (81 kg) IBW/kg (Calculated) : 57 Heparin Dosing Weight: 74  kg  Vital Signs: Temp: 97.9 F (36.6 C) (12/28 0400) Temp Source: Axillary (12/28 0400) BP: 111/60 mmHg (12/28 0700) Pulse Rate: 70 (12/28 0700)  Labs:  Recent Labs  06/19/15 0110 06/19/15 0526 06/19/15 1115 06/19/15 1619 06/20/15 0043 06/20/15 0224  HGB 12.1 11.5*  --   --   --  9.3*  HCT 40.0 35.9*  --   --   --  29.3*  PLT 311 227  --   --   --  154  APTT  --   --   --  50* 68* 68*  HEPARINUNFRC  --   --   --  0.71*  --  0.61  CREATININE 0.89  --  0.58  --   --  0.45  TROPONINI <0.03  --   --   --   --   --     Estimated Creatinine Clearance: 80.6 mL/min (by C-G formula based on Cr of 0.45).   Assessment: 59 yo F admitted 06/19/2015 with recent PE (06/07/15) on Xarelto PTA, now with VDRF. Pharmacy consulted to dose heparin.  HL 0.61, aPTT 68 (therapeutic), likely still influenced by Xarelto PTA. H/H 9.3/29.3 plts 154. No s/sx of bleeding noted.   Goal of Therapy:  Heparin level 0.3-0.7 units/ml aPTT 66-102 seconds Monitor platelets by anticoagulation protocol: Yes   Plan:  - Continue heparin at 1350 units/hr - aPTT and HL daily - Monitor CBC and s/sx of bleeding  Dimitri Ped, PharmD. PGY-1 Pharmacy Resident Pager: [phone]  06/20/2015,7:24 AM

## 2015-06-20 NOTE — Progress Notes (Signed)
[place] Progress Note Patient Name: Kerry [name] DOB: 11-01-1955 MRN: [id number]   Date of Service  06/20/2015  HPI/Events of Note  Request to renew wrist restraints.   eICU Interventions  Will renew wrist restraints.      Intervention Category Minor Interventions: Agitation / anxiety - evaluation and management  Jadarian Mckay Cornelia Copa 06/20/2015, 7:50 PM

## 2015-06-20 NOTE — Consult Note (Signed)
Admission H&P    Chief Complaint: Equivocal acute pontine stroke.  HPI: Kerry [name] is an 59 y.o. 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 CT scan of her head showed an area of low attenuation, concerning for acute infarction. She was on Xaralto for anticoagulation. She is currently on heparin drip IV.  Past Medical History  Diagnosis Date  . Hypertension   . Coronary artery disease   . Diabetes mellitus   . Breast cancer ([place]) 03/10/2007    Left breat  . Degenerative disc disease, cervical   . Degenerative disc disease, lumbar   . Fibromyalgia   . Hyperlipidemia   . Carpal tunnel syndrome on right   . PE (pulmonary embolism)   . UTI (lower urinary tract infection)   . GIB (gastrointestinal bleeding)   . Elevated troponin   . HTN (hypertension) 06/13/2015  . 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    Past Surgical History  Procedure Laterality Date  . Cardiac surgery    . Appendectomy    . Breast surgery    . Mastectomy Left 03/2007  . Cardiac catheterization  2005  . Vein surgery Right 2005  . Tubal ligation Right   . Radiology with anesthesia N/A 04/11/2015    Procedure: RADIOLOGY WITH ANESTHESIA;  Surgeon: Luanne Bras, MD;  Location: [facility];  Service: Radiology;  Laterality: N/A;  . Cardiac catheterization N/A 04/13/2015    Procedure: Temporary Pacemaker;  Surgeon: Peter M Martinique, MD;  Location: [facility];  Service: Cardiovascular;  Laterality: N/A;  . Peg placement N/A 04/18/2015    Procedure: PERCUTANEOUS ENDOSCOPIC GASTROSTOMY (PEG) PLACEMENT;  Surgeon: Judeth Horn, MD;  Location: [facility];  Service: General;  Laterality: N/A;   bedside  . Esophagogastroduodenoscopy N/A 04/18/2015    Procedure: ESOPHAGOGASTRODUODENOSCOPY (EGD);  Surgeon: Judeth Horn, MD;  Location: [facility] ENDOSCOPY;  Service: General;  Laterality: N/A;    Family History  Problem Relation Age of Onset  . Cancer Mother     Breast cancer (right)  . Diabetes Sister   . Cancer Maternal Aunt     Breast cancer  . Cancer Maternal Aunt     Breast cancer  . Cancer Cousin     Breast Cancer   Social History:  reports that she has been smoking.  She has never used smokeless tobacco. She reports that she drinks alcohol. She reports that she does not use illicit drugs.  Allergies:  Allergies  Allergen Reactions  . Codeine Nausea Only    Nausea?-- "made her sick"  . Oxycodone Nausea Only    Sick   . Oxycontin [Oxycodone Hcl] Nausea Only    Sick     Medications Prior to Admission  Medication Sig Dispense Refill  . acetaminophen (TYLENOL) 160 MG/5ML solution Take 10.2-20.3 mLs (325-650 mg total) by mouth every 4 (four) hours as needed for mild pain. 120 mL 0  . atorvastatin (LIPITOR) 40 MG tablet Take 1 tablet (40 mg total) by mouth daily at 6 PM. 30 tablet 1  . carvedilol (COREG) 12.5 MG tablet Take 1 tablet (12.5 mg total) by mouth 2 (two) times daily with a meal. 60 tablet 1  . feeding supplement, ENSURE, (ENSURE) PUDG Take 1 Container by  mouth 3 (three) times daily between meals.    . folic acid (FOLVITE) 1 MG tablet Take 1 mg by mouth daily.    Marland Kitchen glipiZIDE (GLUCOTROL) 5 MG tablet Take by mouth daily before breakfast.    . hydrocerin (EUCERIN) CREA Apply 1 application topically 2 (two) times daily. To bilateral feet  0  . Insulin Glargine (LANTUS) 100 UNIT/ML Solostar Pen Inject 5 Units into the skin daily at 10 pm. 15 mL 1  . lisinopril (PRINIVIL,ZESTRIL) 10 MG tablet Take 1 tablet (10 mg total) by mouth daily. 30 tablet 1  . Melatonin 3 MG TABS Take 6 mg by mouth at bedtime.    . Multiple Vitamins-Minerals (MULTIVITAMINS THER. W/MINERALS) TABS  tablet Take 1 tablet by mouth daily.    . Nutritional Supplements (FEEDING SUPPLEMENT, VITAL AF 1.2 CAL,) LIQD 1 can three times daily; flush with 200 cc water after can is given 90 Can 2  . Rivaroxaban (XARELTO) 15 MG TABS tablet Take 1 tablet (15 mg total) by mouth 2 (two) times daily with a meal. X 16 days 32 tablet 0  . senna-docusate (SENNA PLUS) 8.6-50 MG tablet Take 1 tablet by mouth 2 (two) times daily as needed for mild constipation.    . sodium chloride (OCEAN) 0.65 % SOLN nasal spray Place 1 spray into both nostrils 3 (three) times daily. To help with nose bleeds  0  . thiamine 100 MG tablet Take 100 mg by mouth daily.    . Water For Irrigation, Sterile (FREE WATER) SOLN Place 200 mLs into feeding tube every 8 (eight) hours. 200 mL 99  . zinc sulfate 220 MG capsule Take 220 mg by mouth daily at 6 PM.    . Insulin Pen Needle 32G X 4 MM MISC Use with insulin pen to dispense insulin daily 100 each 3  . [START ON 06/30/2015] rivaroxaban (XARELTO) 20 MG TABS tablet Take 1 tablet (20 mg total) by mouth daily with supper. 30 tablet 1    ROS: History obtained from chart review and family.  General ROS: negative for - chills, fatigue, fever, night sweats, weight gain or weight loss Psychological ROS: negative for - behavioral disorder, hallucinations, memory difficulties, mood swings or suicidal ideation Ophthalmic ROS: negative for - blurry vision, double vision, eye pain or loss of vision ENT ROS: negative for - epistaxis, nasal discharge, oral lesions, sore throat, tinnitus or vertigo Allergy and Immunology ROS: negative for - hives or itchy/watery eyes Hematological and Lymphatic ROS: negative for - bleeding problems, bruising or swollen lymph nodes Endocrine ROS: negative for - galactorrhea, hair pattern changes, polydipsia/polyuria or temperature intolerance Respiratory ROS: As noted in present illness Cardiovascular ROS: negative for - chest pain, dyspnea on exertion, edema or irregular  heartbeat Gastrointestinal ROS: negative for - abdominal pain, diarrhea, hematemesis, nausea/vomiting or stool incontinence Genito-Urinary ROS: negative for - dysuria, hematuria, incontinence or urinary frequency/urgency Musculoskeletal ROS: negative for - joint swelling or muscular weakness Neurological ROS: as noted in HPI Dermatological ROS: negative for rash and skin lesion changes  Physical Examination: Blood pressure 153/60, pulse 77, temperature 99 F (37.2 C), temperature source Axillary, resp. rate 14, height 5' 5"  (1.651 m), weight 81 kg (178 lb 9.2 oz), SpO2 100 %.  HEENT-  Normocephalic, no lesions, without obvious abnormality.  Normal external eye and conjunctiva.  Normal TM's bilaterally.  Normal auditory canals and external ears. Normal external nose, mucus membranes and septum.  Normal pharynx. Neck supple with no masses, nodes, nodules or enlargement.  Cardiovascular - regular rate and rhythm, S1, S2 normal, no murmur, click, rub or gallop Lungs - chest clear, no wheezing, rales, normal symmetric air entry Abdomen - soft, non-tender; bowel sounds normal; no masses,  no organomegaly Extremities - no joint deformities, effusion, or inflammation  Neurologic Examination: Patient was intubated and on mechanical ventilation. She was alert and in no acute distress. She was able to follow commands with use of left extremities. Pupils were equal and reacted normally to light. Extraocular movements were full and conjugate. Right homonymous hemianopsia was noted. No clear facial weakness noted, as patient was intubated and apparatus for stabilizing ET tube was restricting facial movement. Motor exam showed right hemiplegia with mild to moderate spasticity. [place] muscle tone of left extremities were normal. Deep tendon reflexes were 1+ and symmetrical. Plantar response on the right was extensor on the left flexor.  Results for orders placed or performed during the hospital encounter  of 06/19/15 (from the past 48 hour(s))  I-STAT 3, arterial blood gas (G3+)     Status: Abnormal   Collection Time: 06/19/15  1:29 AM  Result Value Ref Range   pH, Arterial 7.340 (L) 7.350 - 7.450   pCO2 arterial 61.6 (HH) 35.0 - 45.0 mmHg   pO2, Arterial 104.0 (H) 80.0 - 100.0 mmHg   Bicarbonate 33.2 (H) 20.0 - 24.0 mEq/L   TCO2 35 0 - 100 mmol/L   O2 Saturation 97.0 %   Acid-Base Excess 6.0 (H) 0.0 - 2.0 mmol/L   Patient temperature 98.6 F    Collection site RADIAL, ALLEN'S TEST ACCEPTABLE    Drawn by RT    Sample type ARTERIAL    Comment NOTIFIED PHYSICIAN   Culture, respiratory (tracheal aspirate)     Status: None (Preliminary result)   Collection Time: 06/19/15  2:19 AM  Result Value Ref Range   Specimen Description TRACHEAL ASPIRATE    Special Requests NONE    Gram Stain      ABUNDANT WBC PRESENT, PREDOMINANTLY PMN RARE SQUAMOUS EPITHELIAL CELLS PRESENT ABUNDANT GRAM POSITIVE COCCI IN PAIRS IN CLUSTERS RARE GRAM POSITIVE RODS RARE YEAST    Culture      ABUNDANT STAPHYLOCOCCUS AUREUS Note: RIFAMPIN AND GENTAMICIN SHOULD NOT BE USED AS SINGLE DRUGS FOR TREATMENT OF STAPH INFECTIONS. Performed at [facility]    Report Status PENDING   Glucose, capillary     Status: Abnormal   Collection Time: 06/19/15  3:46 AM  Result Value Ref Range   Glucose-Capillary 308 (H) 65 - 99 mg/dL   Comment 1 Notify RN   Blood gas, arterial     Status: Abnormal   Collection Time: 06/19/15  4:45 AM  Result Value Ref Range   FIO2 0.40    Delivery systems VENTILATOR    Mode PRESSURE REGULATED VOLUME CONTROL    VT 550 mL   LHR 14 resp/min   Peep/cpap 5.0 cm H20   pH, Arterial 7.457 (H) 7.350 - 7.450   pCO2 arterial 43.1 35.0 - 45.0 mmHg   pO2, Arterial 111 (H) 80.0 - 100.0 mmHg   Bicarbonate 30.0 (H) 20.0 - 24.0 mEq/L   TCO2 31.4 0 - 100 mmol/L   Acid-Base Excess 6.1 (H) 0.0 - 2.0 mmol/L   O2 Saturation 99.1 %   Patient temperature 98.6    Collection site RIGHT RADIAL     Drawn by [id number]    Sample type ARTERIAL DRAW    Allens test (pass/fail) PASS PASS  CBC     Status:  Abnormal   Collection Time: 06/19/15  5:26 AM  Result Value Ref Range   WBC 14.1 (H) 4.0 - 10.5 K/uL   RBC 4.01 3.87 - 5.11 MIL/uL   Hemoglobin 11.5 (L) 12.0 - 15.0 g/dL   HCT 35.9 (L) 36.0 - 46.0 %   MCV 89.5 78.0 - 100.0 fL   MCH 28.7 26.0 - 34.0 pg   MCHC 32.0 30.0 - 36.0 g/dL   RDW 14.7 11.5 - 15.5 %   Platelets 227 150 - 400 K/uL  Magnesium     Status: None   Collection Time: 06/19/15  5:26 AM  Result Value Ref Range   Magnesium 1.9 1.7 - 2.4 mg/dL  Phosphorus     Status: None   Collection Time: 06/19/15  5:26 AM  Result Value Ref Range   Phosphorus 4.1 2.5 - 4.6 mg/dL  Glucose, capillary     Status: Abnormal   Collection Time: 06/19/15  7:59 AM  Result Value Ref Range   Glucose-Capillary 283 (H) 65 - 99 mg/dL   Comment 1 Notify RN   Lactic acid, plasma     Status: Abnormal   Collection Time: 06/19/15 11:15 AM  Result Value Ref Range   Lactic Acid, Venous 3.2 (HH) 0.5 - 2.0 mmol/L    Comment: CRITICAL RESULT CALLED TO, READ BACK BY AND VERIFIED WITH: K.WALTON,RN 06/19/15 1226 BY BSLADE   Basic metabolic panel     Status: Abnormal   Collection Time: 06/19/15 11:15 AM  Result Value Ref Range   Sodium 138 135 - 145 mmol/L   Potassium 4.1 3.5 - 5.1 mmol/L   Chloride 98 (L) 101 - 111 mmol/L   CO2 30 22 - 32 mmol/L   Glucose, Bld 239 (H) 65 - 99 mg/dL   BUN 33 (H) 6 - 20 mg/dL   Creatinine, Ser 0.58 0.44 - 1.00 mg/dL   Calcium 9.6 8.9 - 10.3 mg/dL   GFR calc non Af Amer >60 >60 mL/min   GFR calc Af Amer >60 >60 mL/min    Comment: (NOTE) The eGFR has been calculated using the CKD EPI equation. This calculation has not been validated in all clinical situations. eGFR's persistently <60 mL/min signify possible Chronic Kidney Disease.    Anion gap 10 5 - 15  Glucose, capillary     Status: Abnormal   Collection Time: 06/19/15  1:12 PM  Result Value Ref Range    Glucose-Capillary 164 (H) 65 - 99 mg/dL   Comment 1 Notify RN   Lactic acid, plasma     Status: Abnormal   Collection Time: 06/19/15  2:38 PM  Result Value Ref Range   Lactic Acid, Venous 3.3 (HH) 0.5 - 2.0 mmol/L    Comment: CRITICAL RESULT CALLED TO, READ BACK BY AND VERIFIED WITH: Juanetta Gosling RN [id number] GREEN R   APTT     Status: Abnormal   Collection Time: 06/19/15  4:19 PM  Result Value Ref Range   aPTT 50 (H) 24 - 37 seconds    Comment:        IF BASELINE aPTT IS ELEVATED, SUGGEST PATIENT RISK ASSESSMENT BE USED TO DETERMINE APPROPRIATE ANTICOAGULANT THERAPY.   Heparin level (unfractionated)     Status: Abnormal   Collection Time: 06/19/15  4:19 PM  Result Value Ref Range   Heparin Unfractionated 0.71 (H) 0.30 - 0.70 IU/mL    Comment:        IF HEPARIN RESULTS ARE BELOW EXPECTED VALUES, AND PATIENT DOSAGE HAS BEEN  CONFIRMED, SUGGEST FOLLOW UP TESTING OF ANTITHROMBIN III LEVELS.   Glucose, capillary     Status: Abnormal   Collection Time: 06/19/15  5:16 PM  Result Value Ref Range   Glucose-Capillary 115 (H) 65 - 99 mg/dL   Comment 1 Notify RN   Glucose, capillary     Status: Abnormal   Collection Time: 06/19/15  7:43 PM  Result Value Ref Range   Glucose-Capillary 142 (H) 65 - 99 mg/dL  Glucose, capillary     Status: Abnormal   Collection Time: 06/19/15 11:35 PM  Result Value Ref Range   Glucose-Capillary 206 (H) 65 - 99 mg/dL  APTT     Status: Abnormal   Collection Time: 06/20/15 12:43 AM  Result Value Ref Range   aPTT 68 (H) 24 - 37 seconds    Comment:        IF BASELINE aPTT IS ELEVATED, SUGGEST PATIENT RISK ASSESSMENT BE USED TO DETERMINE APPROPRIATE ANTICOAGULANT THERAPY.   CBC     Status: Abnormal   Collection Time: 06/20/15  2:24 AM  Result Value Ref Range   WBC 9.7 4.0 - 10.5 K/uL   RBC 3.31 (L) 3.87 - 5.11 MIL/uL   Hemoglobin 9.3 (L) 12.0 - 15.0 g/dL   HCT 29.3 (L) 36.0 - 46.0 %   MCV 88.5 78.0 - 100.0 fL   MCH 28.1 26.0 - 34.0 pg   MCHC  31.7 30.0 - 36.0 g/dL   RDW 15.3 11.5 - 15.5 %   Platelets 154 150 - 400 K/uL  Basic metabolic panel     Status: Abnormal   Collection Time: 06/20/15  2:24 AM  Result Value Ref Range   Sodium 139 135 - 145 mmol/L   Potassium 2.9 (L) 3.5 - 5.1 mmol/L    Comment: DELTA CHECK NOTED   Chloride 102 101 - 111 mmol/L   CO2 28 22 - 32 mmol/L   Glucose, Bld 216 (H) 65 - 99 mg/dL   BUN 23 (H) 6 - 20 mg/dL   Creatinine, Ser 0.45 0.44 - 1.00 mg/dL   Calcium 8.6 (L) 8.9 - 10.3 mg/dL   GFR calc non Af Amer >60 >60 mL/min   GFR calc Af Amer >60 >60 mL/min    Comment: (NOTE) The eGFR has been calculated using the CKD EPI equation. This calculation has not been validated in all clinical situations. eGFR's persistently <60 mL/min signify possible Chronic Kidney Disease.    Anion gap 9 5 - 15  Magnesium     Status: None   Collection Time: 06/20/15  2:24 AM  Result Value Ref Range   Magnesium 1.7 1.7 - 2.4 mg/dL  Phosphorus     Status: Abnormal   Collection Time: 06/20/15  2:24 AM  Result Value Ref Range   Phosphorus 1.4 (L) 2.5 - 4.6 mg/dL  Heparin level (unfractionated)     Status: None   Collection Time: 06/20/15  2:24 AM  Result Value Ref Range   Heparin Unfractionated 0.61 0.30 - 0.70 IU/mL    Comment:        IF HEPARIN RESULTS ARE BELOW EXPECTED VALUES, AND PATIENT DOSAGE HAS BEEN CONFIRMED, SUGGEST FOLLOW UP TESTING OF ANTITHROMBIN III LEVELS.   APTT     Status: Abnormal   Collection Time: 06/20/15  2:24 AM  Result Value Ref Range   aPTT 68 (H) 24 - 37 seconds    Comment:        IF BASELINE aPTT IS ELEVATED, SUGGEST PATIENT RISK ASSESSMENT BE  USED TO DETERMINE APPROPRIATE ANTICOAGULANT THERAPY.   Glucose, capillary     Status: Abnormal   Collection Time: 06/20/15  3:37 AM  Result Value Ref Range   Glucose-Capillary 191 (H) 65 - 99 mg/dL  Blood gas, arterial     Status: Abnormal   Collection Time: 06/20/15  4:10 AM  Result Value Ref Range   FIO2 0.30    Delivery systems  VENTILATOR    Mode PRESSURE REGULATED VOLUME CONTROL    VT 550 mL   LHR 14 resp/min   Peep/cpap 5.0 cm H20   pH, Arterial 7.485 (H) 7.350 - 7.450   pCO2 arterial 37.8 35.0 - 45.0 mmHg   pO2, Arterial 111 (H) 80.0 - 100.0 mmHg   Bicarbonate 28.1 (H) 20.0 - 24.0 mEq/L   TCO2 29.3 0 - 100 mmol/L   Acid-Base Excess 4.7 (H) 0.0 - 2.0 mmol/L   O2 Saturation 98.8 %   Patient temperature 98.6    Collection site LEFT RADIAL    Drawn by [id number]    Sample type ARTERIAL DRAW    Allens test (pass/fail) PASS PASS  Glucose, capillary     Status: Abnormal   Collection Time: 06/20/15  8:21 AM  Result Value Ref Range   Glucose-Capillary 213 (H) 65 - 99 mg/dL   Comment 1 Notify RN    Comment 2 Document in Chart    Ct Head Wo Contrast  06/19/2015  CLINICAL DATA:  Altered mental status.  Unresponsive.  Hypoxic. EXAM: CT HEAD WITHOUT CONTRAST TECHNIQUE: Contiguous axial images were obtained from the base of the skull through the vertex without intravenous contrast. COMPARISON:  06/08/2015 FINDINGS: There is no evidence of mass effect, midline shift or extra-axial fluid collections. There is no evidence of a space-occupying lesion or intracranial hemorrhage. There is no evidence of a cortical-based area of acute infarction. There is a small low-attenuation area in the left pons concerning for an acute left pontine infarct. There is a large chronic left MCA territory infarct. The ventricles and sulci are appropriate for the patient's age. The basal cisterns are patent. Visualized portions of the orbits are unremarkable. The visualized portions of the paranasal sinuses and mastoid air cells are unremarkable. The osseous structures are unremarkable. IMPRESSION: 1. Small low-attenuation area in the left pons concerning for an acute left pontine infarct. 2. Old left MCA territory infarct. Electronically Signed   By: Kathreen Devoid   On: 06/19/2015 13:32   Dg Chest Port 1 View  06/20/2015  CLINICAL DATA:  Hypoxia EXAM:  PORTABLE CHEST 1 VIEW COMPARISON:  June 19, 2015 FINDINGS: Endotracheal tube tip is 3.8 cm above the carina. Nasogastric tube tip and side port are in the distal stomach region. No apparent pneumothorax. There is a small area of infiltrate in the right base. Lungs elsewhere clear. Heart is upper normal in size with pulmonary vascularity within normal limits. No adenopathy. IMPRESSION: Tube positions as described without pneumothorax. Small area of infiltrate right base. Lungs elsewhere clear. No change in cardiac silhouette. Electronically Signed   By: Lowella Grip III M.D.   On: 06/20/2015 07:30   Dg Chest Port 1 View  06/19/2015  CLINICAL DATA:  59 year old female status post adjustment of the endotracheal tube. EXAM: PORTABLE CHEST 1 VIEW COMPARISON:  Earlier chest radiograph 06/19/2015 FINDINGS: There has been interval retraction of the endotracheal tube with tip female approximately 5 cm above the carina. There has been interval removal of the enteric tube. No significant interval change in the  appearance of the lungs. Pleural effusion or pneumothorax. Stable cardiac silhouette. The osseous structures appear unremarkable. IMPRESSION: Interval retraction of the endotracheal tube with tip now approximately 5 cm above the carina. Electronically Signed   By: Anner Crete M.D.   On: 06/19/2015 02:45   Dg Chest Port 1 View  06/19/2015  CLINICAL DATA:  Endotracheal tube repositioning.  Initial encounter. EXAM: PORTABLE CHEST 1 VIEW COMPARISON:  Chest radiograph performed earlier today at 1:23 a.m. FINDINGS: The patient's endotracheal tube is seen ending 1-2 cm above the carina. This could be retracted 1-2 cm, as deemed clinically appropriate. The enteric tube is seen extending below the diaphragm. Right basilar airspace opacity appears improved, and may reflect atelectasis or possibly mild pneumonia. No definite pleural effusion or pneumothorax is seen. The cardiomediastinal silhouette is normal in  size. No acute osseous abnormalities are identified. External pacing pads are noted. IMPRESSION: 1. Endotracheal tube seen ending 1-2 cm above the carina. This could be retracted 1-2 cm, as deemed clinically appropriate. 2. Right basilar airspace opacity appears improved, and may reflect atelectasis or possibly mild pneumonia. These results were called by telephone at the time of interpretation on 06/19/2015 at 1:58 am to [facility] on [facility], who verbally acknowledged these results. Electronically Signed   By: Garald Balding M.D.   On: 06/19/2015 02:04   Dg Chest Port 1 View  06/19/2015  CLINICAL DATA:  Endotracheal tube placement.  Initial encounter. EXAM: PORTABLE CHEST 1 VIEW COMPARISON:  None. FINDINGS: The patient's endotracheal tube is seen ending just above the carina. This should be retracted 2-3 cm. The enteric tube is noted extending below the diaphragm. The lungs are well-aerated. Right basilar airspace opacity may reflect atelectasis or pneumonia. A small right pleural effusion is suspected. No pneumothorax is seen. The cardiomediastinal silhouette is within normal limits. No acute osseous abnormalities are seen. External pacing pads are noted. IMPRESSION: 1. Endotracheal tube seen ending just above the carina. This should be retracted 3 cm. 2. Right basilar airspace opacity may reflect atelectasis or pneumonia. Small right pleural effusion suspected. These results were called by telephone at the time of interpretation on 06/19/2015 at 1:58 am to [facility] on [facility], who verbally acknowledged these results. Electronically Signed   By: Garald Balding M.D.   On: 06/19/2015 02:02   Dg Chest Port 1 View  06/19/2015  CLINICAL DATA:  59 year old female with shortness of breath EXAM: PORTABLE CHEST 1 VIEW COMPARISON:  Radiograph dated 06/18/2015 FINDINGS: The patient is tilted to the right. There is stable cardiomegaly. Bibasilar linear atelectatic changes/scarring noted. There is no focal consolidation,  pleural effusion, or pneumothorax. There is degenerative changes of the spine. No acute fracture. IMPRESSION: Stable cardiomegaly.  No acute cardiopulmonary process. Electronically Signed   By: Anner Crete M.D.   On: 06/19/2015 00:41   Dg Abd Portable 1v  06/19/2015  CLINICAL DATA:  Evaluate orogastric tube placement EXAM: PORTABLE ABDOMEN - 1 VIEW COMPARISON:  06/19/15, 06/04/15 FINDINGS: OG tube projects over the stomach with tip over the anticipated position of the antrum. PEG tube also projects over the stomach. Oral contrast noted throughout the large bowel. 61m radiodensity projects over the left abdomen over a loop of large bowel and is not seen on 06/04/15. IMPRESSION: OG tube as described. Abnormal density could represent foreign body either within the abdominal cavity or on the body wall, as well as possibly ingested material. Electronically Signed   By: RSkipper ClicheM.D.   On: 06/19/2015 11:18  Dg Abd Portable 1v  06/19/2015  CLINICAL DATA:  Orogastric tube placement.  Initial encounter. EXAM: PORTABLE ABDOMEN - 1 VIEW COMPARISON:  Abdominal radiograph performed 06/04/2015 FINDINGS: The patient's enteric tube is noted ending overlying the body of the stomach. The endotracheal tube is noted ending just above the carina. This should be retracted 2-3 cm. The visualized bowel gas pattern is difficult to fully assess due to motion artifact, though contrast is seen within the colon. Right basilar airspace opacity is noted. No pleural effusion is seen. No acute osseous abnormalities are identified. External pacing pads are noted. IMPRESSION: 1. Enteric tube noted ending overlying the body of the stomach. 2. Endotracheal tube seen ending just above the carina. This should be retracted 2-3 cm. 3. Right basilar airspace opacity noted. These results were called by telephone at the time of interpretation on 06/19/2015 at 1:58 am to [facility] on [facility], who verbally acknowledged these results.  Electronically Signed   By: Garald Balding M.D.   On: 06/19/2015 02:00    Assessment/Plan 59 year old lady multiple risk factors for stroke as well as previous large MCA territory ischemic infarction, as well as history of pulmonary embolus, admitted for acute respiratory failure requiring intubation. CT scan showed equivocal area of acute infarction involving the left pons, versus artifact.  Recommendations: 1. MRI of the brain without contrast 2. Further management to be determined after MRI results are available 3. Continue anticoagulation per primary team and Pharmacy  We will continue to follow this patient with you.  C.R. Nicole Kindred, MD [facility] [phone]  06/20/2015, 9:55 AM

## 2015-06-20 NOTE — Progress Notes (Signed)
PULMONARY / CRITICAL CARE MEDICINE   Name: Kerry [name] MRN: [id number] DOB: Dec 01, 1955    ADMISSION DATE:  06/19/2015  REFERRING MD:  CIR  CHIEF COMPLAINT: Altered mental status  SUBJECTIVE:  Off pressors.  More alert.  Minimal respiratory effort on PS.  VITAL SIGNS: BP 111/60 mmHg  Pulse 70  Temp(Src) 97.9 F (36.6 C) (Axillary)  Resp 14  Ht 5\' 5"  (1.651 m)  Wt 178 lb 9.2 oz (81 kg)  BMI 29.72 kg/m2  SpO2 100%  VENTILATOR SETTINGS: Vent Mode:  [-] PRVC FiO2 (%):  [30 %] 30 % Set Rate:  [14 bmp] 14 bmp Vt Set:  [550 mL] 550 mL PEEP:  [5 cmH20] 5 cmH20 Plateau Pressure:  [13 cmH20-21 cmH20] 17 cmH20  INTAKE / OUTPUT: I/O last 3 completed shifts: In: 4836.4 [I.V.:3346.1; NG/GT:490.3; IV Piggyback:1000] Out: 1865 [Urine:1865]  PHYSICAL EXAMINATION: General: more alert Neuro:  RASS 0, opens eyes with stimulation, moves Lt side HEENT:  Pupils reactive Cardiovascular:  Regular, no murmur Lungs:  No wheeze Abdomen:  Soft, non tender Musculoskeletal:  No edema Skin:  No rashes  LABS:  BMET  Recent Labs Lab 06/19/15 0110 06/19/15 1115 06/20/15 0224  NA 137 138 139  K 6.3* 4.1 2.9*  CL 96* 98* 102  CO2 30 30 28   BUN 42* 33* 23*  CREATININE 0.89 0.58 0.45  GLUCOSE 384* 239* 216*    Electrolytes  Recent Labs Lab 06/19/15 0110 06/19/15 0526 06/19/15 1115 06/20/15 0224  CALCIUM 9.0  --  9.6 8.6*  MG  --  1.9  --  1.7  PHOS  --  4.1  --  1.4*    CBC  Recent Labs Lab 06/19/15 0110 06/19/15 0526 06/20/15 0224  WBC 16.7* 14.1* 9.7  HGB 12.1 11.5* 9.3*  HCT 40.0 35.9* 29.3*  PLT 311 227 154    Coag's  Recent Labs Lab 06/19/15 1619 06/20/15 0043 06/20/15 0224  APTT 50* 68* 68*    Sepsis Markers  Recent Labs Lab 06/19/15 0110 06/19/15 1115 06/19/15 1438  LATICACIDVEN 1.4 3.2* 3.3*    ABG  Recent Labs Lab 06/19/15 0129 06/19/15 0445 06/20/15 0410  PHART 7.340* 7.457* 7.485*  PCO2ART 61.6* 43.1 37.8  PO2ART  104.0* 111* 111*    Liver Enzymes  Recent Labs Lab 06/14/15 0555 06/19/15 0110  AST 20 23  ALT 24 23  ALKPHOS 52 55  BILITOT 0.4 0.7  ALBUMIN 2.9* 3.1*    Cardiac Enzymes  Recent Labs Lab 06/19/15 0110  TROPONINI <0.03    Glucose  Recent Labs Lab 06/19/15 0759 06/19/15 1312 06/19/15 1716 06/19/15 1943 06/19/15 2335 06/20/15 0337  GLUCAP 283* 164* 115* 142* 206* 191*    Imaging Ct Head Wo Contrast  06/19/2015  CLINICAL DATA:  Altered mental status.  Unresponsive.  Hypoxic. EXAM: CT HEAD WITHOUT CONTRAST TECHNIQUE: Contiguous axial images were obtained from the base of the skull through the vertex without intravenous contrast. COMPARISON:  06/08/2015 FINDINGS: There is no evidence of mass effect, midline shift or extra-axial fluid collections. There is no evidence of a space-occupying lesion or intracranial hemorrhage. There is no evidence of a cortical-based area of acute infarction. There is a small low-attenuation area in the left pons concerning for an acute left pontine infarct. There is a large chronic left MCA territory infarct. The ventricles and sulci are appropriate for the patient's age. The basal cisterns are patent. Visualized portions of the orbits are unremarkable. The visualized portions of the paranasal sinuses  and mastoid air cells are unremarkable. The osseous structures are unremarkable. IMPRESSION: 1. Small low-attenuation area in the left pons concerning for an acute left pontine infarct. 2. Old left MCA territory infarct. Electronically Signed   By: Kathreen Devoid   On: 06/19/2015 13:32   Dg Chest Port 1 View  06/20/2015  CLINICAL DATA:  Hypoxia EXAM: PORTABLE CHEST 1 VIEW COMPARISON:  June 19, 2015 FINDINGS: Endotracheal tube tip is 3.8 cm above the carina. Nasogastric tube tip and side port are in the distal stomach region. No apparent pneumothorax. There is a small area of infiltrate in the right base. Lungs elsewhere clear. Heart is upper normal  in size with pulmonary vascularity within normal limits. No adenopathy. IMPRESSION: Tube positions as described without pneumothorax. Small area of infiltrate right base. Lungs elsewhere clear. No change in cardiac silhouette. Electronically Signed   By: Lowella Grip III M.D.   On: 06/20/2015 07:30   Dg Abd Portable 1v  06/19/2015  CLINICAL DATA:  Evaluate orogastric tube placement EXAM: PORTABLE ABDOMEN - 1 VIEW COMPARISON:  06/19/15, 06/04/15 FINDINGS: OG tube projects over the stomach with tip over the anticipated position of the antrum. PEG tube also projects over the stomach. Oral contrast noted throughout the large bowel. 71mm radiodensity projects over the left abdomen over a loop of large bowel and is not seen on 06/04/15. IMPRESSION: OG tube as described. Abnormal density could represent foreign body either within the abdominal cavity or on the body wall, as well as possibly ingested material. Electronically Signed   By: Skipper Cliche M.D.   On: 06/19/2015 11:18     STUDIES:  12/14 CT chest >> RLL PE 12/15 Doppler Lt leg >> DVT Rt calf 12/16 Laryngoscopy >> vocal fold paresis/hypomobility 12/17 CT head >> Lt MCA CVA 12/27 CT head >> ?acute Lt pontine infarct 12/27 EEG >> diffuse slowing with focus over Lt posterior temporal region  CULTURES: 12/27 Blood >> 12/27 Urine >> 12/27 Sputum >>  ANTIBIOTICS: 12/27 Vancomycin >> 12/27 Fortaz >>  SIGNIFICANT EVENTS: 12/27 Transfer to ICU 12/28 Neurology consulted; off pressors  LINES/TUBES: 12/27 ETT >>  DISCUSSION: 59 yo female was admitted to [facility] 04/11/15 with 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  She was sent to [facility] and then CIR.  She had RLL PE on 06/07/15.  She developed altered mental status and acute hypoxic/hypercapnic respiratory failure 06/19/15 and transferred back to ICU.  Was difficult intubation.  CT head from 12/27 with ? Of acute Lt pontine infarct.  ASSESSMENT / PLAN:  PULMONARY A: Acute  hypoxic/hypercapnic respiratory failure. RLL PE, Lt leg DVT. Vocal cord paresis. P:   Full vent support for now F/u CXR Heparin gtt per pharmacy  CARDIOVASCULAR A:  Sepsis. Hx of CAD, HTN, HLD. P:  Change IV fluids to 50 ml./hr Resume lipitor Hold coreg, prinivil for now  RENAL A:   Hyperkalemia likely from acidosis >> resolved. Hypokalemia. P:   F/u BMET Replace electrolytes as needed  GASTROINTESTINAL A:   Dysphagia. P:   Tube feeds while on vent Protonix  For SUP  HEMATOLOGIC A:   Anemia of critical illness. P:  F/u CBC  INFECTIOUS A:   Sepsis >> source not clear. P:   Day 2 vancomycin, fortaz F/u lactic acid, procalcitonin  ENDOCRINE A:   DM. P:   SSI Hold lantus, glipizide  NEUROLOGIC A:   Acute encephalopathy 2nd to respiratory failure. ?Lt pontine infarct on CT head 12/27. Recent Lt MCA CVA. P:  RASS goal: 0 Neurology consulted 12/28   CC time 33 minutes.  Chesley Mires, MD [facility] Pulmonary/Critical Care 06/20/2015, 8:00 AM Pager:  [phone] After 3pm call: [phone]

## 2015-06-21 ENCOUNTER — Inpatient Hospital Stay (HOSPITAL_COMMUNITY): Payer: BLUE CROSS/BLUE SHIELD

## 2015-06-21 LAB — GLUCOSE, CAPILLARY
GLUCOSE-CAPILLARY: 168 mg/dL — AB (ref 65–99)
GLUCOSE-CAPILLARY: 185 mg/dL — AB (ref 65–99)
GLUCOSE-CAPILLARY: 154 mg/dL — AB (ref 65–99)
GLUCOSE-CAPILLARY: 144 mg/dL — AB (ref 65–99)
Glucose-Capillary: 208 mg/dL — ABNORMAL HIGH (ref 65–99)
Glucose-Capillary: 159 mg/dL — ABNORMAL HIGH (ref 65–99)

## 2015-06-21 LAB — CBC
HEMATOCRIT: 26.6 % — AB (ref 36.0–46.0)
HEMOGLOBIN: 8.6 g/dL — AB (ref 12.0–15.0)
MCH: 28.6 pg (ref 26.0–34.0)
MCHC: 32.3 g/dL (ref 30.0–36.0)
MCV: 88.4 fL (ref 78.0–100.0)
Platelets: 168 10*3/uL (ref 150–400)
RBC: 3.01 MIL/uL — AB (ref 3.87–5.11)
RDW: 15.4 % (ref 11.5–15.5)
WBC: 7.2 10*3/uL (ref 4.0–10.5)

## 2015-06-21 LAB — BASIC METABOLIC PANEL
ANION GAP: 5 (ref 5–15)
BUN: 11 mg/dL (ref 6–20)
CHLORIDE: 106 mmol/L (ref 101–111)
CO2: 27 mmol/L (ref 22–32)
Calcium: 8.2 mg/dL — ABNORMAL LOW (ref 8.9–10.3)
Creatinine, Ser: 0.4 mg/dL — ABNORMAL LOW (ref 0.44–1.00)
GFR calc non Af Amer: >60 mL/min (ref >60)
Glucose, Bld: 161 mg/dL — ABNORMAL HIGH (ref 65–99)
POTASSIUM: 3.2 mmol/L — AB (ref 3.5–5.1)
SODIUM: 138 mmol/L (ref 135–145)

## 2015-06-21 LAB — CULTURE, RESPIRATORY

## 2015-06-21 LAB — CULTURE, RESPIRATORY W GRAM STAIN

## 2015-06-21 LAB — URINE CULTURE: Culture: >=100000

## 2015-06-21 LAB — MAGNESIUM: MAGNESIUM: 1.5 mg/dL — AB (ref 1.7–2.4)

## 2015-06-21 LAB — APTT: aPTT: 71 seconds — ABNORMAL HIGH (ref 24–37)

## 2015-06-21 LAB — HEPARIN LEVEL (UNFRACTIONATED): Heparin Unfractionated: 0.41 IU/mL (ref 0.30–0.70)

## 2015-06-21 MED ORDER — MAGNESIUM SULFATE 2 GM/50ML IV SOLN
2.0000 g | Freq: Once | INTRAVENOUS | Status: AC
  Administered 2015-06-21: 2 g via INTRAVENOUS
  Filled 2015-06-21: qty 50

## 2015-06-21 MED ORDER — POTASSIUM CHLORIDE 20 MEQ/15ML (10%) PO SOLN
40.0000 meq | ORAL | Status: AC
  Administered 2015-06-21 (×2): 40 meq
  Filled 2015-06-21 (×2): qty 30

## 2015-06-21 MED ORDER — CEFAZOLIN SODIUM 1-5 GM-% IV SOLN
1.0000 g | Freq: Three times a day (TID) | INTRAVENOUS | Status: DC
  Administered 2015-06-21 – 2015-06-23 (×6): 1 g via INTRAVENOUS
  Filled 2015-06-21 (×7): qty 50

## 2015-06-21 MED ORDER — LEVOFLOXACIN IN D5W 750 MG/150ML IV SOLN
750.0000 mg | INTRAVENOUS | Status: DC
  Filled 2015-06-21: qty 150

## 2015-06-21 NOTE — Progress Notes (Signed)
PULMONARY / CRITICAL CARE MEDICINE   Name: Kerry [name] MRN: [id number] DOB: [birth year]    ADMISSION DATE:  06/19/2015  REFERRING MD:  CIR  CHIEF COMPLAINT: Altered mental status  SUBJECTIVE:  Minimal Vt on pressure support.  VITAL SIGNS: BP 115/53 mmHg  Pulse 62  Temp(Src) 99.2 F (37.3 C) (Axillary)  Resp 14  Ht 5\' 5"  (1.651 m)  Wt 181 lb 10.5 oz (82.4 kg)  BMI 30.23 kg/m2  SpO2 100%  VENTILATOR SETTINGS: Vent Mode:  [-] PRVC FiO2 (%):  [30 %] 30 % Set Rate:  [14 bmp] 14 bmp Vt Set:  [550 mL] 550 mL PEEP:  [5 cmH20] 5 cmH20 Plateau Pressure:  [17 cmH20-20 cmH20] 17 cmH20  INTAKE / OUTPUT: I/O last 3 completed shifts: In: 5093.8 [I.V.:2983.8; NG/GT:1560; IV Piggyback:550] Out: 1866 [id number]; Stool:1]  PHYSICAL EXAMINATION: General: pleasant Neuro:  RASS 0, opens eyes with stimulation, moves Lt side HEENT:  Pupils reactive Cardiovascular:  Regular, no murmur Lungs: scattered rhonchi Abdomen:  Soft, non tender Musculoskeletal:  No edema Skin:  No rashes  LABS:  BMET  Recent Labs Lab 06/19/15 1115 06/20/15 0224 06/21/15 0524  NA 138 139 138  K 4.1 2.9* 3.2*  CL 98* 102 106  CO2 30 28 27   BUN 33* 23* 11  CREATININE 0.58 0.45 0.40*  GLUCOSE 239* 216* 161*    Electrolytes  Recent Labs Lab 06/19/15 0526 06/19/15 1115 06/20/15 0224 06/21/15 0524  CALCIUM  --  9.6 8.6* 8.2*  MG 1.9  --  1.7 1.5*  PHOS 4.1  --  1.4*  --     CBC  Recent Labs Lab 06/19/15 0526 06/20/15 0224 06/21/15 0524  WBC 14.1* 9.7 7.2  HGB 11.5* 9.3* 8.6*  HCT 35.9* 29.3* 26.6*  PLT 227 154 168    Coag's  Recent Labs Lab 06/20/15 0043 06/20/15 0224 06/21/15 0524  APTT 68* 68* 71*    Sepsis Markers  Recent Labs Lab 06/19/15 1115 06/19/15 1438 06/20/15 0849  LATICACIDVEN 3.2* 3.3* 1.0  PROCALCITON  --   --  <0.10    ABG  Recent Labs Lab 06/19/15 0129 06/19/15 0445 06/20/15 0410  PHART 7.340* 7.457* 7.485*  PCO2ART 61.6* 43.1  37.8  PO2ART 104.0* 111* 111*    Liver Enzymes  Recent Labs Lab 06/19/15 0110  AST 23  ALT 23  ALKPHOS 55  BILITOT 0.7  ALBUMIN 3.1*    Cardiac Enzymes  Recent Labs Lab 06/19/15 0110  TROPONINI <0.03    Glucose  Recent Labs Lab 06/20/15 1149 06/20/15 1517 06/20/15 2005 06/20/15 2329 06/21/15 0338 06/21/15 0755  GLUCAP 254* 191* 176* 208* 159* 168*    Imaging Mr Brain Wo Contrast  06/21/2015  CLINICAL DATA:  Follow-up new pontine stroke. History of LEFT-sided stroke. EXAM: MRI HEAD WITHOUT CONTRAST TECHNIQUE: Multiplanar, multiecho pulse sequences of the brain and surrounding structures were obtained without intravenous contrast. COMPARISON:  CT head June 19, 2015 FINDINGS: Patchy T2 bright signal within the LEFT midbrain and pons corresponding to CT abnormality without superimposed reduced diffusion to suggest acute ischemia. Extensive reduced diffusion LEFT frontotemporal parietal lobes, predominately with T2 shine through. Patchy areas of low ADC value, however there is underlying susceptibility artifact and, this is likely spurious. LEFT basal ganglia and LEFT thalamus infarcts. Mild ex vacuo dilatation LEFT lateral ventricle. No hydrocephalus. No midline shift or mass effect. No mass lesions. No abnormal extra-axial fluid collection. Loss of LEFT internal carotid artery flow void from the skullbase. Probable retrograde  flow as there is LEFT MCA and bilateral anterior cerebral artery flow voids. Ocular globes and orbital contents are normal. Small paranasal sinus air-fluid levels. Life-support lines in place. Moderate bilateral mastoid effusions. No abnormal sellar expansion. No cerebellar tonsillar ectopia. No suspicious calvarial bone marrow signal. IMPRESSION: No evidence of acute ischemia with particular attention to the pons. Old LEFT hemorrhagic MCA territory infarct. Chronically occluded LEFT internal carotid artery. T2 bright signal within the LEFT pons and  midbrain, favoring early wallerian degeneration, less likely old ischemia. Electronically Signed   By: Elon Alas M.D.   On: 06/21/2015 04:07   Dg Chest Port 1 View  06/21/2015  CLINICAL DATA:  Respiratory failure.  Followup exam. EXAM: PORTABLE CHEST 1 VIEW COMPARISON:  06/20/2015 FINDINGS: Endotracheal tube tip projects 2.9 cm above the carina, stable and well-positioned. Orogastric tube passes below the diaphragm into the stomach also stable. Cardiac silhouette there is normal in size and configuration. No mediastinal or hilar masses. There is persistent medial lung base opacity. This may reflect atelectasis or pneumonia, with atelectasis favored. No pulmonary edema. No evidence of pneumothorax. IMPRESSION: 1. Persistent medial lung base opacity most likely due to atelectasis. Pneumonia is possible. 2. Support apparatus well positioned. 3. No significant change from previous day's study allowing for differences in patient positioning and technique. Electronically Signed   By: Lajean Manes M.D.   On: 06/21/2015 08:16     STUDIES:  12/14 CT chest >> RLL PE 12/15 Doppler Lt leg >> DVT Rt calf 12/16 Laryngoscopy >> vocal fold paresis/hypomobility 12/17 CT head >> Lt MCA CVA 12/27 CT head >> ?acute Lt pontine infarct 12/27 EEG >> diffuse slowing with focus over Lt posterior temporal region 12/28 MRI brain >> no evidence of ischemia in pons >> findings more likely wallerian degeneration  CULTURES: 12/27 Blood >> 12/27 Urine >> E coli 12/27 Sputum >> MSSA  ANTIBIOTICS: 12/27 Vancomycin >> 12/29 12/27 Fortaz >> 12/29 12/29 Levaquin >>  SIGNIFICANT EVENTS: 12/27 Transfer to ICU 12/28 Neurology consulted; off pressors  LINES/TUBES: 12/27 ETT >>  DISCUSSION: 59 yo female was admitted to [facility] 04/11/15 with 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  She was sent to [facility] and then CIR.  She had RLL PE on 06/07/15.  She developed altered mental status and acute hypoxic/hypercapnic  respiratory failure 06/19/15 and transferred back to ICU.  Was difficult intubation.  Found to have MSSA HCAP and E coli UTI.  ASSESSMENT / PLAN:  PULMONARY A: Acute hypoxic/hypercapnic respiratory failure. RLL PE, Lt leg DVT. Vocal cord paresis. P:   Pressure support wean as tolerated >> not ready for extubation yet F/u CXR Heparin gtt per pharmacy  CARDIOVASCULAR A:  Sepsis. Hx of CAD, HTN, HLD. P:  KVO IV fluids Continue lipitor Hold coreg, prinivil for now  RENAL A:   Hyperkalemia likely from acidosis >> resolved. Hypokalemia. Hypomagnesemia. P:   F/u BMET Replace electrolytes as needed  GASTROINTESTINAL A:   Dysphagia. P:   Tube feeds while on vent Protonix  For SUP  HEMATOLOGIC A:   Anemia of critical illness. P:  F/u CBC  INFECTIOUS A:   Sepsis from MSSA HCAP, E coli UTI. P:   Day 3 of Abx, change to levaquin 12/29 D/c vancomycin, fortaz  ENDOCRINE A:   DM. P:   SSI Hold lantus, glipizide  NEUROLOGIC A:   Acute encephalopathy 2nd to respiratory failure. ?Lt pontine infarct on CT head 12/27 >> no new CVA on MRI brain 12/28. Recent Lt  MCA CVA. P:   RASS goal: 0 Monitor mental status   CC time 33 minutes.  Chesley Mires, MD [facility] Pulmonary/Critical Care 06/21/2015, 8:41 AM Pager:  [phone] After 3pm call: [phone]

## 2015-06-21 NOTE — Progress Notes (Signed)
Pharmacy Antibiotic Follow-up Note  Kerry [name] is a 59 y.o. year-old female admitted on 06/19/2015.  The patient is currently on day 2 of multiple antibiotics for ecoli uti and MSSA pnemonia.  Assessment/Plan: After discussion with Dr. Halford Chessman, levofloxacin will be changed to cefazolin based on cultures and/or susceptibilities. This agent is less broad and provides better coverage for MSSA PNA. The total length of therapy should be 7 days.   Temp (24hrs), Avg:99.1 F (37.3 C), Min:97.7 F (36.5 C), Max:99.9 F (37.7 C)   Recent Labs Lab 06/18/15 0650 06/19/15 0110 06/19/15 0526 06/20/15 0224 06/21/15 0524  WBC 12.4* 16.7* 14.1* 9.7 7.2    Recent Labs Lab 06/16/15 1708 06/19/15 0110 06/19/15 1115 06/20/15 0224 06/21/15 0524  CREATININE 0.48 0.89 0.58 0.45 0.40*   Estimated Creatinine Clearance: 81.3 mL/min (by C-G formula based on Cr of 0.4).    Allergies  Allergen Reactions  . Codeine Nausea Only    Nausea?-- "made her sick"  . Oxycodone Nausea Only    Sick   . Oxycontin [Oxycodone Hcl] Nausea Only    Sick     Antimicrobials this admission: Ceftazidime 12/27>>12/29 Vancomycin 12/27 >>12/29 Levofloxacin 12/29 x 1 Cefazolin 12/29>>  Levels/dose changes this admission: None  Microbiology results: Urine 100 K Ecoli Sputum MSSA  Thank you for allowing pharmacy to be a part of this patient's care.  Levester Fresh, PharmD, BCPS, [facility] Clinical Pharmacist Pager [phone] 06/21/2015 11:11 AM

## 2015-06-21 NOTE — Progress Notes (Signed)
Subjective: Patient continues to require intubation and ventilatory assistance. No overnight adverse events reported.  Objective: Current vital signs: BP 115/53 mmHg  Pulse 62  Temp(Src) 99.2 F (37.3 C) (Axillary)  Resp 14  Ht 5\' 5"  (1.651 m)  Wt 82.4 kg (181 lb 10.5 oz)  BMI 30.23 kg/m2  SpO2 100%  Neurologic Exam: Patient was alert and in no acute distress. She was able to follow commands with use of left extremities. Extraocular movements were full and conjugate. Face was symmetrical. Strength and muscle tone of left extremities were normal. 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 Deep tendon reflexes were 2+ and symmetrical. Plantar response on the left was flexor on the right mute.  MRI of the brain without contrast on 06/20/2015 showed no signs of acute stroke, including no signs of an acute ischemic lesion involving the pons. Changes seen on CT scan of her head were likely artifactual.  Medications: I have reviewed the patient's current medications.  Assessment/Plan: 59 year old lady with previous large left MCA territory stroke admitted from rehabilitation to ICU for acute respiratory failure, requiring intubation and mechanical ventilation. Etiology for acute respiratory failure is unclear. There is no indication clinically nor by MRI study of recurrent acute stroke.  No further neurodiagnostic studies are indicated at this point, as patient has not had any recurrent stroke, nor acute neurologic insult otherwise. I will plan to see her in follow-up on an as-needed basis following this visit. Result hesitate to call if there are any questions.  C.R. Nicole Kindred, MD [facility] [phone]  06/21/2015  8:44 AM

## 2015-06-21 NOTE — Progress Notes (Signed)
Attempted SBT.  Pt is awake w/ eyes open, however RR =2 and apnea spells noted.  Attempted to wean over 2-3 minutes w/ continued low RR, low vT.  MD aware.

## 2015-06-21 NOTE — Progress Notes (Addendum)
ANTICOAGULATION / Antibiotic CONSULT NOTE - Follow Up Consult  Pharmacy Consult for heparin / Levaquin Indication: pulmonary embolus  / HCAP  Allergies  Allergen Reactions  . Codeine Nausea Only    Nausea?-- "made her sick"  . Oxycodone Nausea Only    Sick   . Oxycontin [Oxycodone Hcl] Nausea Only    Sick     Patient Measurements: Height: 5\' 5"  (165.1 cm) Weight: 181 lb 10.5 oz (82.4 kg) IBW/kg (Calculated) : 57 Heparin Dosing Weight: 74  kg  Vital Signs: Temp: 98.9 F (37.2 C) (12/29 0400) Temp Source: Oral (12/29 0400) BP: 115/53 mmHg (12/29 0600) Pulse Rate: 62 (12/29 0600)  Labs:  Recent Labs  06/19/15 0110 06/19/15 0526 06/19/15 1115  06/19/15 1619 06/20/15 0043 06/20/15 0224 06/21/15 0524  HGB 12.1 11.5*  --   --   --   --  9.3* 8.6*  HCT 40.0 35.9*  --   --   --   --  29.3* 26.6*  PLT 311 227  --   --   --   --  154 168  APTT  --   --   --   < > 50* 68* 68* 71*  HEPARINUNFRC  --   --   --   --  0.71*  --  0.61 0.41  CREATININE 0.89  --  0.58  --   --   --  0.45 0.40*  TROPONINI <0.03  --   --   --   --   --   --   --   < > = values in this interval not displayed.  Estimated Creatinine Clearance: 81.3 mL/min (by C-G formula based on Cr of 0.4).   Assessment: 59 yo F admitted 06/19/2015 with recent PE (06/07/15) on Xarelto PTA, now with VDRF. Pharmacy consulted to dose heparin.  Last dose xarelto 12/26  HL 0.41, aPTT 71 (therapeutic).  H/H 8.6/26.6 plts 168. No s/sx of bleeding noted.  Will now follow HL since aPTT and HL are now correlating   ID: abx D#3 for sepsis - Tmax/24 99.9, WBC 7.2, LA 1.4>3.2>3.3>1.0, PCT <0.10 Vanc/ceftaz d/c.  Pharmacy consulted to dose levaquin for HCAP.  12/27 Trach Cx - abundant GPCs in pairs/clusters, rare yeast.  Staph Aureus Levofloxacin S 12/27 Blood Cx - NG1D 12/27 Urine Cx - >100K E Coli  12/29 CXR Persistent medial lung base opacity most likely due to atelectasis. Pneumonia is possible.  Goal of Therapy:   Heparin level 0.3-0.7 units/ml aPTT 66-102 seconds Monitor platelets by anticoagulation protocol: Yes   Plan:  - Continue heparin at 1350 units/hr - CBC, and HL daily - Monitor for s/sx of bleeding  -Start Levofloxacin 750 mg Q24h -Monitor for s/sx worsening infection, renal function -F/U BCx and [place], PharmD Pharmacy Resident [phone]  06/21/2015,7:33 AM

## 2015-06-21 NOTE — Progress Notes (Signed)
Patient transported from 3M10 to MRI and back with no complications. 

## 2015-06-22 ENCOUNTER — Inpatient Hospital Stay (HOSPITAL_COMMUNITY): Payer: BLUE CROSS/BLUE SHIELD

## 2015-06-22 DIAGNOSIS — N390 Urinary tract infection, site not specified: Secondary | ICD-10-CM

## 2015-06-22 DIAGNOSIS — J15211 Pneumonia due to Methicillin susceptible Staphylococcus aureus: Secondary | ICD-10-CM

## 2015-06-22 DIAGNOSIS — B9620 Unspecified Escherichia coli [E. coli] as the cause of diseases classified elsewhere: Secondary | ICD-10-CM

## 2015-06-22 LAB — GLUCOSE, CAPILLARY
GLUCOSE-CAPILLARY: 162 mg/dL — AB (ref 65–99)
GLUCOSE-CAPILLARY: 164 mg/dL — AB (ref 65–99)
GLUCOSE-CAPILLARY: 158 mg/dL — AB (ref 65–99)
GLUCOSE-CAPILLARY: 159 mg/dL — AB (ref 65–99)
Glucose-Capillary: 136 mg/dL — ABNORMAL HIGH (ref 65–99)
Glucose-Capillary: 158 mg/dL — ABNORMAL HIGH (ref 65–99)
Glucose-Capillary: 179 mg/dL — ABNORMAL HIGH (ref 65–99)

## 2015-06-22 LAB — BASIC METABOLIC PANEL
Anion gap: 6 (ref 5–15)
BUN: 10 mg/dL (ref 6–20)
CALCIUM: 8.4 mg/dL — AB (ref 8.9–10.3)
CO2: 27 mmol/L (ref 22–32)
CREATININE: 0.38 mg/dL — AB (ref 0.44–1.00)
Chloride: 108 mmol/L (ref 101–111)
GFR calc non Af Amer: >60 mL/min (ref >60)
Glucose, Bld: 167 mg/dL — ABNORMAL HIGH (ref 65–99)
Potassium: 3.8 mmol/L (ref 3.5–5.1)
SODIUM: 141 mmol/L (ref 135–145)

## 2015-06-22 LAB — HEPARIN LEVEL (UNFRACTIONATED): HEPARIN UNFRACTIONATED: 0.27 [IU]/mL — AB (ref 0.30–0.70)

## 2015-06-22 LAB — CBC
HCT: 28.3 % — ABNORMAL LOW (ref 36.0–46.0)
Hemoglobin: 8.7 g/dL — ABNORMAL LOW (ref 12.0–15.0)
MCH: 27.7 pg (ref 26.0–34.0)
MCHC: 30.7 g/dL (ref 30.0–36.0)
MCV: 90.1 fL (ref 78.0–100.0)
PLATELETS: 168 10*3/uL (ref 150–400)
RBC: 3.14 MIL/uL — ABNORMAL LOW (ref 3.87–5.11)
RDW: 15.5 % (ref 11.5–15.5)
WBC: 6.7 10*3/uL (ref 4.0–10.5)

## 2015-06-22 LAB — MAGNESIUM: MAGNESIUM: 1.9 mg/dL (ref 1.7–2.4)

## 2015-06-22 LAB — PHOSPHORUS: PHOSPHORUS: 1.5 mg/dL — AB (ref 2.5–4.6)

## 2015-06-22 MED ORDER — CARVEDILOL 12.5 MG PO TABS
12.5000 mg | ORAL_TABLET | Freq: Two times a day (BID) | ORAL | Status: DC
  Administered 2015-06-22 – 2015-06-23 (×3): 12.5 mg
  Filled 2015-06-22 (×4): qty 1

## 2015-06-22 MED ORDER — ENOXAPARIN SODIUM 120 MG/0.8ML ~~LOC~~ SOLN
120.0000 mg | SUBCUTANEOUS | Status: DC
  Administered 2015-06-22 – 2015-06-24 (×3): 120 mg via SUBCUTANEOUS
  Filled 2015-06-22 (×4): qty 0.8

## 2015-06-22 MED ORDER — SODIUM PHOSPHATE 3 MMOLE/ML IV SOLN
30.0000 mmol | Freq: Once | INTRAVENOUS | Status: AC
  Administered 2015-06-22: 30 mmol via INTRAVENOUS
  Filled 2015-06-22: qty 10

## 2015-06-22 MED ORDER — CARVEDILOL 12.5 MG PO TABS: 12.5000 mg | ORAL_TABLET | Freq: Two times a day (BID) | ORAL | Status: DC

## 2015-06-22 NOTE — Progress Notes (Signed)
ANTICOAGULATION CONSULT NOTE - Follow Up Consult  Pharmacy Consult for heparin  Indication: pulmonary embolus    Allergies  Allergen Reactions  . Codeine Nausea Only    Nausea?-- "made her sick"  . Oxycodone Nausea Only    Sick   . Oxycontin [Oxycodone Hcl] Nausea Only    Sick     Patient Measurements: Height: 5\' 5"  (165.1 cm) Weight: 181 lb 10.5 oz (82.4 kg) IBW/kg (Calculated) : 57 Heparin Dosing Weight: 74  kg  Vital Signs: Temp: 99.3 F (37.4 C) (12/30 0000) Temp Source: Axillary (12/30 0000) BP: 114/61 mmHg (12/30 0327) Pulse Rate: 55 (12/30 0327)  Labs:  Recent Labs  06/19/15 1115  06/20/15 0043 06/20/15 0224 06/21/15 0524 06/22/15 0331  HGB  --   --   --  9.3* 8.6* 8.7*  HCT  --   --   --  29.3* 26.6* 28.3*  PLT  --   --   --  154 168 168  APTT  --   < > 68* 68* 71*  --   HEPARINUNFRC  --   < >  --  0.61 0.41 0.27*  CREATININE 0.58  --   --  0.45 0.40*  --   < > = values in this interval not displayed.  Estimated Creatinine Clearance: 81.3 mL/min (by C-G formula based on Cr of 0.4).   Assessment: 59 yo F admitted 06/19/2015 with recent PE (06/07/15) on Xarelto PTA, now with VDRF. Pharmacy consulted to dose heparin.  Last dose xarelto 12/26. Heparin level down to 0.27 (slightly subtherapeutic). Hgb low but stable. No issues with line or bleeding reported per RN.  Goal of Therapy:  Heparin level 0.3-0.7 units/ml Monitor platelets by anticoagulation protocol: Yes   Plan:  - Increase heparin to 1500 units/hr - F/u 6 hr heparin level  Sherlon Handing, PharmD, BCPS Clinical pharmacist, pager [phone]  06/22/2015,4:15 AM

## 2015-06-22 NOTE — Progress Notes (Signed)
Patient self extubated herself. RT placed patient on 4L [place]. Patient has O2 Sat of 99% and is in no distress. Dr. Jimmy Footman is aware. RT will continue to monitor.

## 2015-06-22 NOTE — Progress Notes (Signed)
ANTICOAGULATION CONSULT NOTE - Follow Up Consult  Pharmacy Consult for lovenox Indication: pulmonary embolus  Allergies  Allergen Reactions  . Codeine Nausea Only    Nausea?-- "made her sick"  . Oxycodone Nausea Only    Sick   . Oxycontin [Oxycodone Hcl] Nausea Only    Sick     Patient Measurements: Height: 5\' 5"  (165.1 cm) Weight: 182 lb 15.7 oz (83 kg) IBW/kg (Calculated) : 57  Vital Signs: Temp: 97.2 F (36.2 C) (12/30 0800) Temp Source: Axillary (12/30 0800) BP: 114/52 mmHg (12/30 0900) Pulse Rate: 61 (12/30 0900)  Labs:  Recent Labs  06/20/15 0043  06/20/15 0224 06/21/15 0524 06/22/15 0331  HGB  --   < > 9.3* 8.6* 8.7*  HCT  --   --  29.3* 26.6* 28.3*  PLT  --   --  154 168 168  APTT 68*  --  68* 71*  --   HEPARINUNFRC  --   --  0.61 0.41 0.27*  CREATININE  --   --  0.45 0.40* 0.38*  < > = values in this interval not displayed.  Estimated Creatinine Clearance: 81.6 mL/min (by C-G formula based on Cr of 0.38).   Assessment: 59 yo F admitted 06/19/2015 with recent PE (06/07/15) on Xarelto PTA. Patient was initially on heparin drip but is now to be transitioned to Lovenox. Pharmacy consulted to start Lovenox.  Wt 83 kg, H/H stable, Plt wnl. No s/sx of bleeding noted.  Goal of Therapy:  Monitor platelets by anticoagulation protocol: Yes   Plan:  - Lovenox 120 mg (~1.5 mg/kg) SQ q24h to start 1 hour after heparin drip stopped - Monitor CBC and s/sx of bleeding  Dimitri Ped, PharmD. PGY-1 Pharmacy Resident Pager: [phone]  06/22/2015,9:34 AM

## 2015-06-22 NOTE — Progress Notes (Signed)
[facility] Progress Note Patient Name: Kerry [name] DOB: May 13, 1956 MRN: [id number]   Date of Service  06/22/2015  HPI/Events of Note  Hypophosphatemia  eICU Interventions  Phos replaced     Intervention Category Intermediate Interventions: Electrolyte abnormality - evaluation and management  Lorien Shingler 06/22/2015, 4:26 AM

## 2015-06-22 NOTE — Progress Notes (Signed)
PULMONARY / CRITICAL CARE MEDICINE   Name: Kerry [name] MRN: [id number] DOB: 16-May-1956    ADMISSION DATE:  06/19/2015  REFERRING MD:  CIR  CHIEF COMPLAINT: Altered mental status  SUBJECTIVE:  Doing much better with pressure support.  VITAL SIGNS: BP 130/61 mmHg  Pulse 68  Temp(Src) 97.2 F (36.2 C) (Axillary)  Resp 19  Ht 5\' 5"  (1.651 m)  Wt 182 lb 15.7 oz (83 kg)  BMI 30.45 kg/m2  SpO2 98%  VENTILATOR SETTINGS: Vent Mode:  [-] CPAP;PSV FiO2 (%):  [40 %] 40 % Set Rate:  [14 bmp] 14 bmp Vt Set:  [550 mL] 550 mL PEEP:  [5 cmH20] 5 cmH20 Pressure Support:  [5 cmH20] 5 cmH20 Plateau Pressure:  [16 cmH20-20 cmH20] 20 cmH20  INTAKE / OUTPUT: I/O last 3 completed shifts: In: 3081.2 [I.V.:1381.2; NG/GT:1550; IV Piggyback:150] Out: [id number] [Urine:1530; Stool:1]  PHYSICAL EXAMINATION: General: pleasant Neuro:  RASS 0, opens eyes with stimulation, moves Lt side HEENT:  Pupils reactive Cardiovascular:  Regular, no murmur Lungs: scattered rhonchi Abdomen:  Soft, non tender, G tube site clean Musculoskeletal:  No edema Skin:  No rashes  LABS:  BMET  Recent Labs Lab 06/20/15 0224 06/21/15 0524 06/22/15 0331  NA 139 138 141  K 2.9* 3.2* 3.8  CL 102 106 108  CO2 28 27 27   BUN 23* 11 10  CREATININE 0.45 0.40* 0.38*  GLUCOSE 216* 161* 167*    Electrolytes  Recent Labs Lab 06/19/15 0526  06/20/15 0224 06/21/15 0524 06/22/15 0331  CALCIUM  --   < > 8.6* 8.2* 8.4*  MG 1.9  --  1.7 1.5* 1.9  PHOS 4.1  --  1.4*  --  1.5*  < > = values in this interval not displayed.  CBC  Recent Labs Lab 06/20/15 0224 06/21/15 0524 06/22/15 0331  WBC 9.7 7.2 6.7  HGB 9.3* 8.6* 8.7*  HCT 29.3* 26.6* 28.3*  PLT 154 168 168    Coag's  Recent Labs Lab 06/20/15 0043 06/20/15 0224 06/21/15 0524  APTT 68* 68* 71*    Sepsis Markers  Recent Labs Lab 06/19/15 1115 06/19/15 1438 06/20/15 0849  LATICACIDVEN 3.2* 3.3* 1.0  PROCALCITON  --   --  <0.10     ABG  Recent Labs Lab 06/19/15 0129 06/19/15 0445 06/20/15 0410  PHART 7.340* 7.457* 7.485*  PCO2ART 61.6* 43.1 37.8  PO2ART 104.0* 111* 111*    Liver Enzymes  Recent Labs Lab 06/19/15 0110  AST 23  ALT 23  ALKPHOS 55  BILITOT 0.7  ALBUMIN 3.1*    Cardiac Enzymes  Recent Labs Lab 06/19/15 0110  TROPONINI <0.03    Glucose  Recent Labs Lab 06/21/15 1205 06/21/15 1544 06/21/15 1929 06/21/15 2334 06/22/15 0326 06/22/15 0821  GLUCAP 185* 154* 144* 179* 162* 158*    Imaging Dg Chest Port 1 View  06/22/2015  CLINICAL DATA:  59 year old female with Healthcare associated pneumonia. Subsegmental pulmonary emboli earlier this month. Initial encounter. EXAM: PORTABLE CHEST 1 VIEW COMPARISON:  06/21/2015 and earlier. FINDINGS: Portable AP semi upright view at 0617 hours. Stable endotracheal tube. Stable visualized enteric tube. Stable cardiac size and mediastinal contours. Lung volumes are within normal limits. Allowing for portable technique, the lungs are clear. No pneumothorax or pleural effusion. IMPRESSION: 1.  Stable lines and tubes. 2.  No acute cardiopulmonary abnormality. Electronically Signed   By: Genevie Ann M.D.   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  She was sent to [facility] and then CIR.  She had RLL PE on 06/07/15.  She developed altered mental status and acute hypoxic/hypercapnic respiratory failure 06/19/15 and transferred back to ICU.  Was difficult intubation.  Found to have MSSA HCAP and E coli UTI.  ASSESSMENT / PLAN:  PULMONARY A: Acute hypoxic/hypercapnic respiratory failure. RLL PE, Lt leg DVT. Vocal cord paresis. P:   Pressure support wean as tolerated >> might be ready for extubation trial soon F/u CXR Change to treatment dose lovenox per pharmacy for now >> d/c heparin gtt  CARDIOVASCULAR A:  Sepsis. Hx of CAD, HTN, HLD. P:  KVO IV fluids Continue lipitor Resume coreg 12/30 Hold prinivil for now  RENAL A:   Hyperkalemia likely from acidosis >> resolved. Hypokalemia. Hypomagnesemia. Hypophosphatemia. P:   F/u BMET Replace electrolytes as needed  GASTROINTESTINAL A:   Dysphagia s/p G tube. P:   Tube feeds while on vent Protonix  For SUP  HEMATOLOGIC A:   Anemia of critical illness. P:  F/u CBC  INFECTIOUS A:   Sepsis from MSSA HCAP, E coli UTI. P:   Day 4/7 of Abx, changed to ancef 12/29  ENDOCRINE A:   DM. P:   SSI Hold lantus, glipizide  NEUROLOGIC A:   Acute encephalopathy 2nd to respiratory failure. ?Lt pontine infarct on CT head 12/27 >> no new CVA on MRI brain 12/28. Recent Lt MCA CVA. P:   RASS goal: 0 Monitor mental status   CC time 32 minutes.  Chesley Mires, MD [facility] Pulmonary/Critical Care 06/22/2015, 9:12 AM Pager:  [phone] After 3pm call: [phone]

## 2015-06-23 ENCOUNTER — Inpatient Hospital Stay (HOSPITAL_COMMUNITY): Payer: BLUE CROSS/BLUE SHIELD

## 2015-06-23 DIAGNOSIS — J9601 Acute respiratory failure with hypoxia: Secondary | ICD-10-CM

## 2015-06-23 LAB — BLOOD GAS, ARTERIAL
ACID-BASE EXCESS: 4.6 mmol/L — AB (ref 0.0–2.0)
Bicarbonate: 29.6 mEq/L — ABNORMAL HIGH (ref 20.0–24.0)
DRAWN BY: 441351
FIO2: 0.4
LHR: 14 {breaths}/min
MECHVT: 550 mL
O2 SAT: 97.1 %
PATIENT TEMPERATURE: 98.6
PCO2 ART: 51.9 mmHg — AB (ref 35.0–45.0)
PEEP/CPAP: 5 cmH2O
PH ART: 7.374 (ref 7.350–7.450)
PO2 ART: 90.3 mmHg (ref 80.0–100.0)
TCO2: 31.2 mmol/L (ref 0–100)

## 2015-06-23 LAB — BASIC METABOLIC PANEL
Anion gap: 9 (ref 5–15)
BUN: 10 mg/dL (ref 6–20)
CALCIUM: 7.5 mg/dL — AB (ref 8.9–10.3)
CO2: 26 mmol/L (ref 22–32)
CREATININE: 0.35 mg/dL — AB (ref 0.44–1.00)
Chloride: 106 mmol/L (ref 101–111)
GFR calc Af Amer: >60 mL/min (ref >60)
Glucose, Bld: 217 mg/dL — ABNORMAL HIGH (ref 65–99)
Potassium: 3.3 mmol/L — ABNORMAL LOW (ref 3.5–5.1)
SODIUM: 141 mmol/L (ref 135–145)

## 2015-06-23 LAB — CBC
HCT: 27.3 % — ABNORMAL LOW (ref 36.0–46.0)
Hemoglobin: 8.4 g/dL — ABNORMAL LOW (ref 12.0–15.0)
MCH: 28.1 pg (ref 26.0–34.0)
MCHC: 30.8 g/dL (ref 30.0–36.0)
MCV: 91.3 fL (ref 78.0–100.0)
PLATELETS: 156 10*3/uL (ref 150–400)
RBC: 2.99 MIL/uL — AB (ref 3.87–5.11)
RDW: 15.4 % (ref 11.5–15.5)
WBC: 8.1 10*3/uL (ref 4.0–10.5)

## 2015-06-23 LAB — PHOSPHORUS: Phosphorus: 3.8 mg/dL (ref 2.5–4.6)

## 2015-06-23 LAB — GLUCOSE, CAPILLARY
GLUCOSE-CAPILLARY: 241 mg/dL — AB (ref 65–99)
GLUCOSE-CAPILLARY: 160 mg/dL — AB (ref 65–99)
GLUCOSE-CAPILLARY: 137 mg/dL — AB (ref 65–99)
Glucose-Capillary: 177 mg/dL — ABNORMAL HIGH (ref 65–99)
Glucose-Capillary: 141 mg/dL — ABNORMAL HIGH (ref 65–99)
Glucose-Capillary: 164 mg/dL — ABNORMAL HIGH (ref 65–99)

## 2015-06-23 LAB — MAGNESIUM: MAGNESIUM: 1.4 mg/dL — AB (ref 1.7–2.4)

## 2015-06-23 MED ORDER — ETOMIDATE 2 MG/ML IV SOLN
20.0000 mg | Freq: Once | INTRAVENOUS | Status: AC
  Administered 2015-06-23: 20 mg via INTRAVENOUS

## 2015-06-23 MED ORDER — PIPERACILLIN-TAZOBACTAM 3.375 G IVPB 30 MIN
3.3750 g | INTRAVENOUS | Status: AC
  Administered 2015-06-23: 3.375 g via INTRAVENOUS
  Filled 2015-06-23: qty 50

## 2015-06-23 MED ORDER — MIDAZOLAM HCL 2 MG/2ML IJ SOLN
2.0000 mg | Freq: Once | INTRAMUSCULAR | Status: DC
Start: 2015-06-23 — End: 2015-06-23

## 2015-06-23 MED ORDER — FENTANYL CITRATE (PF) 100 MCG/2ML IJ SOLN
50.0000 ug | Freq: Once | INTRAMUSCULAR | Status: AC
  Administered 2015-06-23: 50 ug via INTRAVENOUS

## 2015-06-23 MED ORDER — ETOMIDATE 2 MG/ML IV SOLN: 20.0000 mg | Freq: Once | INTRAVENOUS | Status: AC

## 2015-06-23 MED ORDER — VANCOMYCIN HCL IN DEXTROSE 1-5 GM/200ML-% IV SOLN
1000.0000 mg | Freq: Three times a day (TID) | INTRAVENOUS | Status: DC
  Administered 2015-06-23 – 2015-06-25 (×7): 1000 mg via INTRAVENOUS
  Filled 2015-06-23 (×8): qty 200

## 2015-06-23 MED ORDER — SODIUM CHLORIDE 0.9 % IV SOLN
25.0000 ug/h | INTRAVENOUS | Status: DC
  Administered 2015-06-23: 50 ug/h via INTRAVENOUS
  Administered 2015-06-24: 75 ug/h via INTRAVENOUS
  Administered 2015-06-27: 25 ug/h via INTRAVENOUS
  Filled 2015-06-23 (×3): qty 50

## 2015-06-23 MED ORDER — PIPERACILLIN-TAZOBACTAM 3.375 G IVPB
3.3750 g | Freq: Three times a day (TID) | INTRAVENOUS | Status: DC
  Administered 2015-06-23 – 2015-06-25 (×6): 3.375 g via INTRAVENOUS
  Filled 2015-06-23 (×8): qty 50

## 2015-06-23 MED ORDER — FENTANYL CITRATE (PF) 100 MCG/2ML IJ SOLN
INTRAMUSCULAR | Status: AC
  Filled 2015-06-23: qty 4

## 2015-06-23 MED ORDER — MIDAZOLAM HCL 2 MG/2ML IJ SOLN
INTRAMUSCULAR | Status: AC
  Filled 2015-06-23: qty 4

## 2015-06-23 MED ORDER — MAGNESIUM SULFATE 2 GM/50ML IV SOLN
2.0000 g | Freq: Once | INTRAVENOUS | Status: AC
  Administered 2015-06-23: 2 g via INTRAVENOUS
  Filled 2015-06-23: qty 50

## 2015-06-23 MED ORDER — MIDAZOLAM HCL 2 MG/2ML IJ SOLN
2.0000 mg | Freq: Once | INTRAMUSCULAR | Status: AC
  Administered 2015-06-23: 2 mg via INTRAVENOUS

## 2015-06-23 NOTE — Progress Notes (Signed)
Fentanyl gtt wasted in sink 100 ml with Velvet Bathe, RN

## 2015-06-23 NOTE — Progress Notes (Signed)
PULMONARY / CRITICAL CARE MEDICINE   Name: Kerry [name] MRN: [id number] DOB: 06-Aug-1955    ADMISSION DATE:  06/19/2015  REFERRING MD:  CIR  CHIEF COMPLAINT: Altered mental status  SUBJECTIVE:  Self extubated overnight >> likely aspirated, and then re-intubated.  VITAL SIGNS: BP 103/51 mmHg  Pulse 76  Temp(Src) 98.3 F (36.8 C) (Axillary)  Resp 14  Ht 5\' 5"  (1.651 m)  Wt 182 lb 5.1 oz (82.7 kg)  BMI 30.34 kg/m2  SpO2 100%  VENTILATOR SETTINGS: Vent Mode:  [-] PRVC FiO2 (%):  [30 %-60 %] 40 % Set Rate:  [14 bmp] 14 bmp Vt Set:  [550 mL] 550 mL PEEP:  [5 cmH20-8 cmH20] 5 cmH20 Pressure Support:  [5 cmH20] 5 cmH20 Plateau Pressure:  [16 cmH20-24 cmH20] 18 cmH20  INTAKE / OUTPUT: I/O last 3 completed shifts: In: 3331.4 [I.V.:971.4; NG/GT:1850; IV Piggyback:510] Out: 705 [Urine:705]  PHYSICAL EXAMINATION: General: pleasant Neuro:  RASS -2 HEENT:  Pupils reactive Cardiovascular:  Regular, no murmur Lungs: crackles Rt > Lt  Abdomen:  Soft, non tender, G tube site clean Musculoskeletal:  No edema Skin:  No rashes  LABS:  BMET  Recent Labs Lab 06/21/15 0524 06/22/15 0331 06/23/15 0432  NA 138 141 141  K 3.2* 3.8 3.3*  CL 106 108 106  CO2 27 27 26   BUN 11 10 10   CREATININE 0.40* 0.38* 0.35*  GLUCOSE 161* 167* 217*    Electrolytes  Recent Labs Lab 06/20/15 0224 06/21/15 0524 06/22/15 0331 06/23/15 0432  CALCIUM 8.6* 8.2* 8.4* 7.5*  MG 1.7 1.5* 1.9 1.4*  PHOS 1.4*  --  1.5* 3.8    CBC  Recent Labs Lab 06/21/15 0524 06/22/15 0331 06/23/15 0432  WBC 7.2 6.7 8.1  HGB 8.6* 8.7* 8.4*  HCT 26.6* 28.3* 27.3*  PLT 168 168 156    Coag's  Recent Labs Lab 06/20/15 0043 06/20/15 0224 06/21/15 0524  APTT 68* 68* 71*    Sepsis Markers  Recent Labs Lab 06/19/15 1115 06/19/15 1438 06/20/15 0849  LATICACIDVEN 3.2* 3.3* 1.0  PROCALCITON  --   --  <0.10    ABG  Recent Labs Lab 06/19/15 0445 06/20/15 0410 06/23/15 0438   PHART 7.457* 7.485* 7.374  PCO2ART 43.1 37.8 51.9*  PO2ART 111* 111* 90.3    Liver Enzymes  Recent Labs Lab 06/19/15 0110  AST 23  ALT 23  ALKPHOS 55  BILITOT 0.7  ALBUMIN 3.1*    Cardiac Enzymes  Recent Labs Lab 06/19/15 0110  TROPONINI <0.03    Glucose  Recent Labs Lab 06/22/15 0821 06/22/15 1211 06/22/15 1545 06/22/15 1940 06/22/15 2309 06/23/15 0350  GLUCAP 158* 164* 158* 159* 136* 241*    Imaging Dg Chest Port 1 View  06/23/2015  CLINICAL DATA:  Endotracheal tube placement.  Initial encounter. EXAM: PORTABLE CHEST 1 VIEW COMPARISON:  Chest radiograph performed 06/22/2015 FINDINGS: The patient's endotracheal tube is seen ending 2-3 cm above the carina. New diffuse right-sided airspace opacification raises concern for pneumonia. No definite pleural effusion or pneumothorax is seen. Minimal left basilar airspace opacity is also seen. No pneumothorax identified The cardiomediastinal silhouette is borderline enlarged. No acute osseous abnormalities are seen. IMPRESSION: 1. Endotracheal tube seen ending 2-3 cm above the carina. 2. Diffuse new right-sided airspace opacification raises concern for pneumonia. 3. Borderline cardiomegaly. Electronically Signed   By: Garald Balding M.D.   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  She was sent to [facility] and then CIR.  She had RLL PE on 06/07/15.  She developed altered mental status and acute hypoxic/hypercapnic respiratory failure 06/19/15 and transferred back to ICU.  Was difficult intubation.  Found to have MSSA HCAP and E coli UTI.  ASSESSMENT / PLAN:  PULMONARY A: Acute hypoxic/hypercapnic respiratory failure. Probable aspiration event 12/31. RLL PE, Lt leg DVT. Vocal cord paresis. P:   Full vent support F/u CXR Lovenox tx dose  CARDIOVASCULAR A:  Sepsis. Hx of CAD, HTN, HLD. P:  KVO IV fluids Continue lipitor, coreg Hold prinivil for now  RENAL A:   Hyperkalemia likely from acidosis >> resolved. Hypokalemia. Hypomagnesemia. Hypophosphatemia. P:   F/u BMET Replace electrolytes as needed  GASTROINTESTINAL A:   Dysphagia s/p G tube. P:   Tube feeds while on vent Protonix  For SUP  HEMATOLOGIC A:   Anemia of critical illness. P:  F/u CBC  INFECTIOUS A:   Sepsis from MSSA HCAP, E coli UTI. Aspiration pneumonitis 12/31. P:   Day 5 of Abx, change to vancomycin/zosyn 12/31 due to new infiltrate  ENDOCRINE A:   DM. P:   SSI Hold lantus, glipizide  NEUROLOGIC A:   Acute encephalopathy 2nd to respiratory failure. ?Lt pontine infarct on CT head 12/27 >> no new CVA on MRI brain 12/28. Recent Lt MCA CVA. P:   RASS goal: 0 Monitor mental status   CC time 33 minutes.  Chesley Mires, MD [facility] Pulmonary/Critical Care 06/23/2015, 6:46 AM Pager:  [phone] After 3pm call: [phone]

## 2015-06-23 NOTE — Procedures (Signed)
Intubation Procedure Note Kerry [name] [id number] 20-Feb-1956  Procedure: Intubation Indications: Respiratory insufficiency; failed extubation  Procedure Details Consent: obtained from patient's spouse prior to procedure by phone Time Out: Verified patient identification, verified procedure, site/side was marked, verified correct patient position, special equipment/implants available, medications/allergies/relevent history reviewed, required imaging and test results available.  Performed  Maximum sterile technique was used including gloves and mask.  Miller and 2 Versed 2, Fentanyl 50, Etomidate 20.  Pre-oxygenation with NRB, then BVM w/ peep valve to O2 sat 97%. Single attempt with Sabra Heck 2. Grade I view of edematous glottic structures with narrow cord aperture. Bougie advanced between the cords under direct visualization and 6.5 ETT advanced over the bougie between the cords to a depth of 22cm at the lip. No complications. Lowest sat 92%.   Evaluation Hemodynamic Status: hypertensive prior to procedure; O2 sats: stable throughout Patient's Current Condition: stable Complications: No apparent complications Patient did tolerate procedure well. Chest X-ray ordered to verify placement.  CXR: pending.   Dannielle Burn, MD 06/23/2015

## 2015-06-23 NOTE — Progress Notes (Signed)
2123 Pt self extubated. Placed on 4 L [place] with O2 sat 99 and does not appear to be in any distress. MD aware.   0015 patient O2 sats dropped to the 80's, with HR in 110's, SBP 180-200's, increased work of breathing and diaphoretic. Placed pt on NRB and hit elink button. MD to come to bedside.  0030 MD at bedside. MD called Husband for consent and to notify that his wife was being re intubated.  0048 patient given 50 mcg of fentanyl, 2 mg of versed, and 20 mg of etomidate with MD and RT at bedside to intubate.  Post intubation orders given to restart fentanyl gtt and for left wrist restraint.

## 2015-06-23 NOTE — Progress Notes (Signed)
Sputum culture labeled and sent to lab w/ appropriate requisition.

## 2015-06-23 NOTE — Progress Notes (Signed)
ANTIBIOTIC CONSULT NOTE - INITIAL  Pharmacy Consult for Vancomycin and Zosyn Indication: HCAP  Allergies  Allergen Reactions  . Codeine Nausea Only    Nausea?-- "made her sick"  . Oxycodone Nausea Only    Sick   . Oxycontin [Oxycodone Hcl] Nausea Only    Sick     Patient Measurements: Height: 5\' 5"  (165.1 cm) Weight: 182 lb 5.1 oz (82.7 kg) IBW/kg (Calculated) : 57  Vital Signs: Temp: 98.3 F (36.8 C) (12/31 0400) Temp Source: Axillary (12/31 0400) BP: 103/51 mmHg (12/31 0400) Pulse Rate: 76 (12/31 0400) Intake/Output from previous day: 12/30 0701 - 12/31 0700 In: 1829.6 [I.V.:369.6; NG/GT:1000; IV Piggyback:460] Out: -  Intake/Output from this shift: Total I/O In: 250.9 [I.V.:50.9; NG/GT:150; IV Piggyback:50] Out: -   Labs:  Recent Labs  06/21/15 0524 06/22/15 0331 06/23/15 0432  WBC 7.2 6.7 8.1  HGB 8.6* 8.7* 8.4*  PLT 168 168 156  CREATININE 0.40* 0.38* 0.35*   Estimated Creatinine Clearance: 81.4 mL/min (by C-G formula based on Cr of 0.35). No results for input(s): VANCOTROUGH, VANCOPEAK, VANCORANDOM, GENTTROUGH, GENTPEAK, GENTRANDOM, TOBRATROUGH, TOBRAPEAK, TOBRARND, AMIKACINPEAK, AMIKACINTROU, AMIKACIN in the last 72 hours.   Microbiology: Recent Results (from the past 720 hour(s))  Culture, Urine     Status: None   Collection Time: 05/29/15  4:59 PM  Result Value Ref Range Status   Specimen Description URINE, CLEAN CATCH  Final   Special Requests NONE  Final   Culture >=100,000 COLONIES/mL ESCHERICHIA COLI  Final   Report Status 06/01/2015 FINAL  Final   Organism ID, Bacteria ESCHERICHIA COLI  Final      Susceptibility   Escherichia coli - MIC*    AMPICILLIN <=2 SENSITIVE Sensitive     CEFAZOLIN <=4 SENSITIVE Sensitive     CEFTRIAXONE <=1 SENSITIVE Sensitive     CIPROFLOXACIN <=0.25 SENSITIVE Sensitive     GENTAMICIN <=1 SENSITIVE Sensitive     IMIPENEM <=0.25 SENSITIVE Sensitive     NITROFURANTOIN <=16 SENSITIVE Sensitive     TRIMETH/SULFA  <=20 SENSITIVE Sensitive     AMPICILLIN/SULBACTAM <=2 SENSITIVE Sensitive     PIP/TAZO <=4 SENSITIVE Sensitive     * >=100,000 COLONIES/mL ESCHERICHIA COLI  Culture, blood (x 2)     Status: None   Collection Time: 06/07/15  8:19 AM  Result Value Ref Range Status   Specimen Description BLOOD RIGHT HAND  Final   Special Requests BOTTLES DRAWN AEROBIC ONLY 4ML  Final   Culture NO GROWTH 5 DAYS  Final   Report Status 06/12/2015 FINAL  Final  Culture, blood (x 2)     Status: None   Collection Time: 06/07/15  8:25 AM  Result Value Ref Range Status   Specimen Description BLOOD RIGHT HAND  Final   Special Requests BOTTLES DRAWN AEROBIC ONLY 1ML  Final   Culture NO GROWTH 5 DAYS  Final   Report Status 06/12/2015 FINAL  Final  MRSA PCR Screening     Status: None   Collection Time: 06/08/15  2:14 PM  Result Value Ref Range Status   MRSA by PCR NEGATIVE NEGATIVE Final    Comment:        The GeneXpert MRSA Assay (FDA approved for NASAL specimens only), is one component of a comprehensive MRSA colonization surveillance program. It is not intended to diagnose MRSA infection nor to guide or monitor treatment for MRSA infections.   Culture, respiratory (tracheal aspirate)     Status: None   Collection Time: 06/19/15  2:19 AM  Result Value Ref Range Status   Specimen Description TRACHEAL ASPIRATE  Final   Special Requests NONE  Final   Gram Stain   Final    ABUNDANT WBC PRESENT, PREDOMINANTLY PMN RARE SQUAMOUS EPITHELIAL CELLS PRESENT ABUNDANT GRAM POSITIVE COCCI IN PAIRS IN CLUSTERS RARE GRAM POSITIVE RODS RARE YEAST    Culture   Final    ABUNDANT STAPHYLOCOCCUS AUREUS Note: RIFAMPIN AND GENTAMICIN SHOULD NOT BE USED AS SINGLE DRUGS FOR TREATMENT OF STAPH INFECTIONS. Performed at [facility]    Report Status 06/21/2015 FINAL  Final   Organism ID, Bacteria STAPHYLOCOCCUS AUREUS  Final      Susceptibility   Staphylococcus aureus - MIC*    CLINDAMYCIN >=8 RESISTANT  Resistant     ERYTHROMYCIN >=8 RESISTANT Resistant     GENTAMICIN <=0.5 SENSITIVE Sensitive     LEVOFLOXACIN <=0.12 SENSITIVE Sensitive     OXACILLIN 0.5 SENSITIVE Sensitive     RIFAMPIN <=0.5 SENSITIVE Sensitive     TRIMETH/SULFA <=10 SENSITIVE Sensitive     VANCOMYCIN 1 SENSITIVE Sensitive     TETRACYCLINE <=1 SENSITIVE Sensitive     MOXIFLOXACIN <=0.25 SENSITIVE Sensitive     * ABUNDANT STAPHYLOCOCCUS AUREUS  Urine culture     Status: None   Collection Time: 06/19/15  9:42 AM  Result Value Ref Range Status   Specimen Description URINE, CATHETERIZED  Final   Special Requests NONE  Final   Culture >=100,000 COLONIES/mL ESCHERICHIA COLI  Final   Report Status 06/21/2015 FINAL  Final   Organism ID, Bacteria ESCHERICHIA COLI  Final      Susceptibility   Escherichia coli - MIC*    AMPICILLIN <=2 SENSITIVE Sensitive     CEFAZOLIN <=4 SENSITIVE Sensitive     CEFTRIAXONE <=1 SENSITIVE Sensitive     CIPROFLOXACIN <=0.25 SENSITIVE Sensitive     GENTAMICIN <=1 SENSITIVE Sensitive     IMIPENEM <=0.25 SENSITIVE Sensitive     NITROFURANTOIN <=16 SENSITIVE Sensitive     TRIMETH/SULFA <=20 SENSITIVE Sensitive     AMPICILLIN/SULBACTAM <=2 SENSITIVE Sensitive     PIP/TAZO <=4 SENSITIVE Sensitive     * >=100,000 COLONIES/mL ESCHERICHIA COLI  Culture, blood (Routine X 2) w Reflex to ID Panel     Status: None (Preliminary result)   Collection Time: 06/19/15 11:15 AM  Result Value Ref Range Status   Specimen Description BLOOD WRIST RIGHT  Final   Special Requests IN PEDIATRIC BOTTLE 1CC  Final   Culture NO GROWTH 3 DAYS  Final   Report Status PENDING  Incomplete  Culture, blood (Routine X 2) w Reflex to ID Panel     Status: None (Preliminary result)   Collection Time: 06/19/15 11:27 AM  Result Value Ref Range Status   Specimen Description BLOOD RIGHT HAND  Final   Special Requests IN PEDIATRIC BOTTLE 1CC  Final   Culture NO GROWTH 3 DAYS  Final   Report Status PENDING  Incomplete     Medical History: Past Medical History  Diagnosis Date  . Hypertension   . Coronary artery disease   . Diabetes mellitus   . Breast cancer ([place]) 03/10/2007    Left breat  . Degenerative disc disease, cervical   . Degenerative disc disease, lumbar   . Fibromyalgia   . Hyperlipidemia   . Carpal tunnel syndrome on right   . PE (pulmonary embolism)   . UTI (lower urinary tract infection)   . GIB (gastrointestinal bleeding)   . Elevated troponin   . HTN (  hypertension) 06/13/2015  . 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    Medications:  Scheduled:  . antiseptic oral rinse  7 mL Mouth Rinse QID  . atorvastatin  40 mg Per Tube q1800  . carvedilol  12.5 mg Per Tube BID WC  . chlorhexidine gluconate  15 mL Mouth Rinse BID  . enoxaparin (LOVENOX) injection  120 mg Subcutaneous Q24H  . feeding supplement (PRO-STAT SUGAR FREE 64)  30 mL Per Tube Daily  . folic acid  1 mg Per Tube Daily  . insulin aspart  0-15 Units Subcutaneous 6 times per day  . magnesium sulfate 1 - 4 g bolus IVPB  2 g Intravenous Once  . pantoprazole sodium  40 mg Per Tube Q24H  . thiamine  100 mg Per Tube Daily   Assessment: 59 y.o. F known to pharmacy from anticoagulation dosing. Pt on Ancef (total abx Day #5). To broaden abx to vancomycin and Zosyn for HCAP with new infiltrate. Afeb. WBC wnl.  12/27 Trach Cx - MSSA  12/27 Blood Cx - NGTD  12/27 Urine Cx - >100K E Coli - pan sensitive  Vanc 12/27>>12/28 ; restart 12/31>> Ceftaz 12/27>>12/29  Levaquin 12/29>>12/29  Cefazolin 12/29>>12/31 Zosyn 12/31>>  Goal of Therapy:  Vancomycin trough level 15-20 mcg/ml  Plan:  Zosyn 3.375gm IV now over 30 min then 3.375gm IV q8h - subsequent doses over 4 hours Vancomycin 1gm IV q8h Will f/u micro data, renal function, and pt's clinical condition Vanc trough prn  Sherlon Handing, PharmD, BCPS Clinical pharmacist, pager [phone] 06/23/2015,6:56 AM

## 2015-06-23 NOTE — Progress Notes (Signed)
Pt w/ apnea on vent, vent alarming apnea "back up ventilation".  Placed pt back on full vent support.

## 2015-06-24 ENCOUNTER — Inpatient Hospital Stay (HOSPITAL_COMMUNITY): Payer: BLUE CROSS/BLUE SHIELD

## 2015-06-24 LAB — BASIC METABOLIC PANEL
Anion gap: 10 (ref 5–15)
BUN: 13 mg/dL (ref 6–20)
CHLORIDE: 103 mmol/L (ref 101–111)
CO2: 29 mmol/L (ref 22–32)
CREATININE: 0.4 mg/dL — AB (ref 0.44–1.00)
Calcium: 8.3 mg/dL — ABNORMAL LOW (ref 8.9–10.3)
GFR calc Af Amer: >60 mL/min (ref >60)
GFR calc non Af Amer: >60 mL/min (ref >60)
Glucose, Bld: 172 mg/dL — ABNORMAL HIGH (ref 65–99)
Potassium: 3.1 mmol/L — ABNORMAL LOW (ref 3.5–5.1)
SODIUM: 142 mmol/L (ref 135–145)

## 2015-06-24 LAB — GLUCOSE, CAPILLARY
GLUCOSE-CAPILLARY: 151 mg/dL — AB (ref 65–99)
GLUCOSE-CAPILLARY: 167 mg/dL — AB (ref 65–99)
Glucose-Capillary: 175 mg/dL — ABNORMAL HIGH (ref 65–99)
Glucose-Capillary: 182 mg/dL — ABNORMAL HIGH (ref 65–99)
Glucose-Capillary: 178 mg/dL — ABNORMAL HIGH (ref 65–99)
Glucose-Capillary: 148 mg/dL — ABNORMAL HIGH (ref 65–99)
Glucose-Capillary: 153 mg/dL — ABNORMAL HIGH (ref 65–99)

## 2015-06-24 LAB — CBC
HCT: 25.5 % — ABNORMAL LOW (ref 36.0–46.0)
Hemoglobin: 8.1 g/dL — ABNORMAL LOW (ref 12.0–15.0)
MCH: 28.6 pg (ref 26.0–34.0)
MCHC: 31.8 g/dL (ref 30.0–36.0)
MCV: 90.1 fL (ref 78.0–100.0)
PLATELETS: 159 10*3/uL (ref 150–400)
RBC: 2.83 MIL/uL — ABNORMAL LOW (ref 3.87–5.11)
RDW: 15.5 % (ref 11.5–15.5)
WBC: 6.9 10*3/uL (ref 4.0–10.5)

## 2015-06-24 LAB — CULTURE, BLOOD (ROUTINE X 2)
Culture: NO GROWTH
Culture: NO GROWTH

## 2015-06-24 LAB — MAGNESIUM: Magnesium: 1.7 mg/dL (ref 1.7–2.4)

## 2015-06-24 MED ORDER — LISINOPRIL 10 MG PO TABS: 10.0000 mg | ORAL_TABLET | Freq: Every day | ORAL | Status: DC

## 2015-06-24 MED ORDER — POTASSIUM CHLORIDE 10 MEQ/50ML IV SOLN: 10.0000 meq | INTRAVENOUS | Status: DC

## 2015-06-24 MED ORDER — MAGNESIUM SULFATE 2 GM/50ML IV SOLN
2.0000 g | Freq: Once | INTRAVENOUS | Status: AC
  Administered 2015-06-24: 2 g via INTRAVENOUS
  Filled 2015-06-24: qty 50

## 2015-06-24 MED ORDER — POTASSIUM CHLORIDE 20 MEQ/15ML (10%) PO SOLN
40.0000 meq | Freq: Once | ORAL | Status: AC
Start: 2015-06-24 — End: 2015-06-24
  Administered 2015-06-24: 40 meq
  Filled 2015-06-24: qty 30

## 2015-06-24 MED ORDER — LISINOPRIL 10 MG PO TABS
10.0000 mg | ORAL_TABLET | Freq: Every day | ORAL | Status: DC
  Administered 2015-06-24 – 2015-07-06 (×13): 10 mg
  Filled 2015-06-24 (×13): qty 1

## 2015-06-24 NOTE — Progress Notes (Signed)
[facility] ADULT ICU REPLACEMENT PROTOCOL FOR AM LAB REPLACEMENT ONLY  The patient does not apply for the [facility] Adult ICU Electrolyte Replacment Protocol based on the criteria listed below:   1. Is GFR >/= 40 ml/min? Yes.    Patient's GFR today is >60 2. Is urine output >/= 0.5 ml/kg/hr for the last 6 hours? No. Patient's UOP is NONE RECORDED ml/kg/hr 3. Is BUN < 60 mg/dL? Yes.    Patient's BUN today is 13 4. Abnormal electrolyte(s): K+3.1  MG 1.7 5. Ordered repletion with: NA 6. If a panic level lab has been reported, has the CCM MD in charge been notified? Yes.  .   Physician:  Arlester Marker, [place] 06/24/2015 4:47 AM

## 2015-06-24 NOTE — Progress Notes (Signed)
[place] Progress Note Patient Name: Kerry [name] DOB: 28-Feb-1956 MRN: [id number]   Date of Service  06/24/2015  HPI/Events of Note  K+ = 3.1, Mg++ = 1.7 and Creatinine = 0.4.  eICU Interventions  Will replete K+ and Mg++.     Intervention Category Intermediate Interventions: Electrolyte abnormality - evaluation and management  Annia Gomm [place] 06/24/2015, 4:54 AM

## 2015-06-24 NOTE — Progress Notes (Signed)
PULMONARY / CRITICAL CARE MEDICINE   Name: Kerry [name] MRN: [id number] DOB: [birth year]    ADMISSION DATE:  06/19/2015  REFERRING MD:  CIR  CHIEF COMPLAINT: Altered mental status  SUBJECTIVE:  More alert today.  VITAL SIGNS: BP 116/52 mmHg  Pulse 50  Temp(Src) 97.7 F (36.5 C) (Axillary)  Resp 14  Ht 5\' 5"  (1.651 m)  Wt 186 lb 15.2 oz (84.8 kg)  BMI 31.11 kg/m2  SpO2 100%  VENTILATOR SETTINGS: Vent Mode:  [-] PRVC FiO2 (%):  [30 %-40 %] 40 % Set Rate:  [14 bmp] 14 bmp Vt Set:  [550 mL] 550 mL PEEP:  [5 cmH20] 5 cmH20 Plateau Pressure:  [16 cmH20-18 cmH20] 18 cmH20  INTAKE / OUTPUT: I/O last 3 completed shifts: In: 3438.4 [I.V.:568.4; Other:70; NG/GT:1800; IV Piggyback:1000] Out: -   PHYSICAL EXAMINATION: General: pleasant Neuro:  RASS 0 HEENT:  Pupils reactive Cardiovascular:  Regular, no murmur Lungs: crackles Rt > Lt - better air movement Abdomen:  Soft, non tender, G tube site clean Musculoskeletal:  No edema Skin:  No rashes  LABS:  BMET  Recent Labs Lab 06/22/15 0331 06/23/15 0432 06/24/15 0226  NA 141 141 142  K 3.8 3.3* 3.1*  CL 108 106 103  CO2 27 26 29   BUN 10 10 13   CREATININE 0.38* 0.35* 0.40*  GLUCOSE 167* 217* 172*    Electrolytes  Recent Labs Lab 06/20/15 0224  06/22/15 0331 06/23/15 0432 06/24/15 0226  CALCIUM 8.6*  < > 8.4* 7.5* 8.3*  MG 1.7  < > 1.9 1.4* 1.7  PHOS 1.4*  --  1.5* 3.8  --   < > = values in this interval not displayed.  CBC  Recent Labs Lab 06/22/15 0331 06/23/15 0432 06/24/15 0226  WBC 6.7 8.1 6.9  HGB 8.7* 8.4* 8.1*  HCT 28.3* 27.3* 25.5*  PLT 168 156 159    Coag's  Recent Labs Lab 06/20/15 0043 06/20/15 0224 06/21/15 0524  APTT 68* 68* 71*    Sepsis Markers  Recent Labs Lab 06/19/15 1115 06/19/15 1438 06/20/15 0849  LATICACIDVEN 3.2* 3.3* 1.0  PROCALCITON  --   --  <0.10    ABG  Recent Labs Lab 06/19/15 0445 06/20/15 0410 06/23/15 0438  PHART 7.457* 7.485*  7.374  PCO2ART 43.1 37.8 51.9*  PO2ART 111* 111* 90.3    Liver Enzymes  Recent Labs Lab 06/19/15 0110  AST 23  ALT 23  ALKPHOS 55  BILITOT 0.7  ALBUMIN 3.1*    Cardiac Enzymes  Recent Labs Lab 06/19/15 0110  TROPONINI <0.03    Glucose  Recent Labs Lab 06/23/15 1121 06/23/15 1547 06/23/15 1924 06/23/15 2314 06/24/15 0402 06/24/15 0738  GLUCAP 177* 141* 137* 164* 175* 151*    Imaging No results found.   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  She was sent to [facility] and then CIR.  She had RLL PE on 06/07/15.  She developed altered mental status and acute hypoxic/hypercapnic respiratory failure 06/19/15 and transferred back to ICU.  Was difficult intubation.  Found to have MSSA HCAP and E coli UTI.  Self extubated and reintubated 12/31 due to aspiration.  ASSESSMENT / PLAN:  PULMONARY A: Acute hypoxic/hypercapnic respiratory failure. Probable aspiration event 12/31. RLL PE, Lt leg DVT. Vocal cord paresis. P:   Pressure support wean as tolerated >> concerned she might need trach again to assist with vent weaning F/u  CXR Lovenox tx dose  CARDIOVASCULAR A:  Sepsis. Hx of CAD, HTN, HLD. Bradycardia 1/01. P:  KVO IV fluids Hold coreg 1/01 Continue lipitor  Resume prinivil 1/01  RENAL A:   Hyperkalemia likely from acidosis >> resolved. Hypokalemia. Hypomagnesemia. Hypophosphatemia. P:   F/u BMET Replace electrolytes as needed  GASTROINTESTINAL A:   Dysphagia s/p G tube. P:   Tube feeds while on vent Protonix  For SUP  HEMATOLOGIC A:   Anemia of critical illness. P:  F/u CBC Check iron levels  INFECTIOUS A:   Sepsis from MSSA HCAP, E coli UTI. Aspiration pneumonitis 12/31. P:   Day 6 of Abx, changed to vancomycin/zosyn 12/31 due to new infiltrate  ENDOCRINE A:   DM. P:   SSI Hold lantus, glipizide  NEUROLOGIC A:   Acute encephalopathy 2nd to respiratory failure. ?Lt pontine infarct on CT head 12/27 >> no new CVA on MRI brain 12/28. Recent Lt MCA CVA. P:   RASS goal: 0 Monitor mental status   CC time 31 minutes.  Chesley Mires, MD [facility] Pulmonary/Critical Care 06/24/2015, 8:31 AM Pager:  [phone] After 3pm call: [phone]

## 2015-06-25 ENCOUNTER — Inpatient Hospital Stay (HOSPITAL_COMMUNITY): Payer: BLUE CROSS/BLUE SHIELD

## 2015-06-25 DIAGNOSIS — R400 Somnolence: Secondary | ICD-10-CM

## 2015-06-25 DIAGNOSIS — Z978 Presence of other specified devices: Secondary | ICD-10-CM | POA: Diagnosis present

## 2015-06-25 DIAGNOSIS — J189 Pneumonia, unspecified organism: Secondary | ICD-10-CM | POA: Diagnosis present

## 2015-06-25 DIAGNOSIS — Z4659 Encounter for fitting and adjustment of other gastrointestinal appliance and device: Secondary | ICD-10-CM | POA: Diagnosis present

## 2015-06-25 DIAGNOSIS — Z789 Other specified health status: Secondary | ICD-10-CM

## 2015-06-25 LAB — GLUCOSE, CAPILLARY
GLUCOSE-CAPILLARY: 139 mg/dL — AB (ref 65–99)
GLUCOSE-CAPILLARY: 156 mg/dL — AB (ref 65–99)
GLUCOSE-CAPILLARY: 148 mg/dL — AB (ref 65–99)
Glucose-Capillary: 163 mg/dL — ABNORMAL HIGH (ref 65–99)
Glucose-Capillary: 144 mg/dL — ABNORMAL HIGH (ref 65–99)

## 2015-06-25 LAB — BASIC METABOLIC PANEL
ANION GAP: 9 (ref 5–15)
BUN: 11 mg/dL (ref 6–20)
CALCIUM: 8.4 mg/dL — AB (ref 8.9–10.3)
CHLORIDE: 104 mmol/L (ref 101–111)
CO2: 28 mmol/L (ref 22–32)
Creatinine, Ser: 0.36 mg/dL — ABNORMAL LOW (ref 0.44–1.00)
GFR calc non Af Amer: >60 mL/min (ref >60)
Glucose, Bld: 160 mg/dL — ABNORMAL HIGH (ref 65–99)
Potassium: 3.6 mmol/L (ref 3.5–5.1)
Sodium: 141 mmol/L (ref 135–145)

## 2015-06-25 LAB — IRON AND TIBC
IRON: 32 ug/dL (ref 28–170)
SATURATION RATIOS: 14 % (ref 10.4–31.8)
TIBC: 235 ug/dL — ABNORMAL LOW (ref 250–450)
UIBC: 203 ug/dL

## 2015-06-25 LAB — FERRITIN: Ferritin: 134 ng/mL (ref 11–307)

## 2015-06-25 LAB — MAGNESIUM: Magnesium: 1.6 mg/dL — ABNORMAL LOW (ref 1.7–2.4)

## 2015-06-25 LAB — CBC
HCT: 26.7 % — ABNORMAL LOW (ref 36.0–46.0)
Hemoglobin: 8.3 g/dL — ABNORMAL LOW (ref 12.0–15.0)
MCH: 27.9 pg (ref 26.0–34.0)
MCHC: 31.1 g/dL (ref 30.0–36.0)
MCV: 89.6 fL (ref 78.0–100.0)
PLATELETS: 175 10*3/uL (ref 150–400)
RBC: 2.98 MIL/uL — AB (ref 3.87–5.11)
RDW: 15.4 % (ref 11.5–15.5)
WBC: 7.2 10*3/uL (ref 4.0–10.5)

## 2015-06-25 LAB — APTT: aPTT: 29 seconds (ref 24–37)

## 2015-06-25 LAB — PROTIME-INR
INR: 1.1 (ref 0.00–1.49)
PROTHROMBIN TIME: 14.4 s (ref 11.6–15.2)

## 2015-06-25 MED ORDER — MAGNESIUM SULFATE 4 GM/100ML IV SOLN
4.0000 g | Freq: Once | INTRAVENOUS | Status: AC
Start: 2015-06-25 — End: 2015-06-25
  Administered 2015-06-25: 4 g via INTRAVENOUS
  Filled 2015-06-25: qty 100

## 2015-06-25 MED ORDER — MIDAZOLAM HCL 2 MG/2ML IJ SOLN
4.0000 mg | Freq: Once | INTRAMUSCULAR | Status: AC
Start: 2015-06-25 — End: 2015-06-27
  Administered 2015-06-27: 4 mg via INTRAVENOUS

## 2015-06-25 MED ORDER — MAGNESIUM SULFATE 50 % IJ SOLN
4.0000 g | Freq: Once | INTRAVENOUS | Status: DC
  Filled 2015-06-25: qty 8

## 2015-06-25 MED ORDER — FENTANYL CITRATE (PF) 100 MCG/2ML IJ SOLN
200.0000 ug | Freq: Once | INTRAMUSCULAR | Status: AC
  Administered 2015-06-27: 200 ug via INTRAVENOUS

## 2015-06-25 MED ORDER — ENOXAPARIN SODIUM 150 MG/ML ~~LOC~~ SOLN
1.5000 mg/kg | SUBCUTANEOUS | Status: DC
  Administered 2015-06-25 – 2015-06-26 (×2): 125 mg via SUBCUTANEOUS
  Filled 2015-06-25 (×2): qty 0.84

## 2015-06-25 MED ORDER — DEXTROSE 5 % IV SOLN
1.0000 g | INTRAVENOUS | Status: DC
  Administered 2015-06-25 – 2015-06-29 (×5): 1 g via INTRAVENOUS
  Filled 2015-06-25 (×5): qty 10

## 2015-06-25 MED ORDER — ETOMIDATE 2 MG/ML IV SOLN
40.0000 mg | Freq: Once | INTRAVENOUS | Status: AC
  Administered 2015-06-27: 20 mg via INTRAVENOUS
  Filled 2015-06-25: qty 20

## 2015-06-25 MED ORDER — ETOMIDATE 2 MG/ML IV SOLN
20.0000 mg | Freq: Once | INTRAVENOUS | Status: AC
  Administered 2015-06-25: 20 mg via INTRAVENOUS

## 2015-06-25 MED ORDER — CHLORHEXIDINE GLUCONATE 0.12 % MT SOLN
OROMUCOSAL | Status: AC
  Filled 2015-06-25: qty 15

## 2015-06-25 MED ORDER — PROPOFOL 500 MG/50ML IV EMUL
5.0000 ug/kg/min | Freq: Once | INTRAVENOUS | Status: DC
  Filled 2015-06-25: qty 50

## 2015-06-25 MED ORDER — VECURONIUM BROMIDE 10 MG IV SOLR: 10.0000 mg | Freq: Once | INTRAVENOUS | Status: DC

## 2015-06-25 NOTE — Progress Notes (Signed)
ANTICOAGULATION CONSULT NOTE - Follow Up Consult ANTIBIOTIC CONSULT NOTE - Follow Up Consult  Pharmacy Consult for Lovenox, Vancomycin and Zosyn Indication: pulmonary embolus, aspiration pneumonia  Allergies  Allergen Reactions  . Codeine Nausea Only    Nausea?-- "made her sick"  . Oxycodone Nausea Only    Sick   . Oxycontin [Oxycodone Hcl] Nausea Only    Sick     Patient Measurements: Height: 5\' 5"  (165.1 cm) Weight: 186 lb 1.1 oz (84.4 kg) IBW/kg (Calculated) : 57  Vital Signs: Temp: 98.5 F (36.9 C) (01/02 0348) Temp Source: Axillary (01/02 0348) BP: 134/61 mmHg (01/02 0600) Pulse Rate: 49 (01/02 0600)  Labs:  Recent Labs  06/23/15 0432 06/24/15 0226 06/25/15 0439  HGB 8.4* 8.1* 8.3*  HCT 27.3* 25.5* 26.7*  PLT 156 159 175  CREATININE 0.35* 0.40* 0.36*    Estimated Creatinine Clearance: 82.3 mL/min (by C-G formula based on Cr of 0.36).   Medications:  Lovenox 120mg  sq q24  Assessment: 59yof continues on therapeutic lovenox for her PE (CT angio 12/15). Her weight has increased so will adjust lovenox dose. Hgb remains low but stable. No bleeding reported.   She was also restarted on vancomycin and zosyn 12/31 due to new right sided infiltrate on CXR after she self extubated and likely aspirated. Renal function is stable. Will check a vancomycin trough tonight with q8 dosing.  12/27 Trach Cx - MSSA (resis to clinda) 12/27 Blood Cx - NGTD 12/27 Urine Cx - >100K E Coli (pansensitive) 12/31 Resp Cx -   Vanc 12/27>>12/28; restart 12/31>> Ceftaz 12/27>>12/29 Levaquin 12/29>>12/29 Cefazolin 12/29>>12/31 Zosyn 12/31>>  Goal of Therapy:  Anti-Xa level 0.6-1 units/ml 4hrs after LMWH dose given Monitor platelets by anticoagulation protocol: Yes Vancomycin trough 15-20  Plan:  1) Change lovenox to 125mg  SQ q24 (~1.5mg /kg q24) 2) Continue vancomycin 1g IV q8 - check trough prior to 1600 dose today 3) Continue zosyn 3.375g IV q8 (4 hour infusion)  Deboraha Sprang 06/25/2015,7:31 AM

## 2015-06-25 NOTE — Procedures (Signed)
Intubation Procedure Note MEAGON DEGOLIER [id number] May 25, 1956  Procedure: Intubation Indications: Respiratory insufficiency for preperation for trach  Procedure Details Consent: Risks of procedure as well as the alternatives and risks of each were explained to the (patient/caregiver).  Consent for procedure obtained. Time Out: Verified patient identification, verified procedure, site/side was marked, verified correct patient position, special equipment/implants available, medications/allergies/relevent history reviewed, required imaging and test results available.  Performed  Maximum sterile technique was used including gloves, gown, hand hygiene and mask.  MAC and 3  Placed bouche through old small 6.5, remove dtube, placed new 7.5 and direct visual, placed 7.5 easily over bouche Tolerated well   Evaluation Hemodynamic Status: BP stable throughout; O2 sats: stable throughout Patient's Current Condition: stable Complications: No apparent complications Patient did tolerate procedure well. Chest X-ray ordered to verify placement.  CXR: pending.   [name] 06/25/2015

## 2015-06-25 NOTE — Progress Notes (Signed)
PULMONARY / CRITICAL CARE MEDICINE   Name: Kerry [name] MRN: [id number] DOB: 29-Jan-1956    ADMISSION DATE:  06/19/2015  REFERRING MD:  CIR  CHIEF COMPLAINT: Altered mental status  SUBJECTIVE:  Alert weaning  VITAL SIGNS: BP 141/72 mmHg  Pulse 48  Temp(Src) 98.1 F (36.7 C) (Axillary)  Resp 14  Ht 5\' 5"  (1.651 m)  Wt 84.4 kg (186 lb 1.1 oz)  BMI 30.96 kg/m2  SpO2 100%  VENTILATOR SETTINGS: Vent Mode:  [-] PRVC FiO2 (%):  [40 %] 40 % Set Rate:  [14 bmp-1474 bmp] 14 bmp Vt Set:  [550 mL] 550 mL PEEP:  [5 cmH20] 5 cmH20 Pressure Support:  [8 cmH20] 8 cmH20 Plateau Pressure:  [17 cmH20-18 cmH20] 17 cmH20  INTAKE / OUTPUT: I/O last 3 completed shifts: In: 55 [I.V.:533; NG/GT:1800; IV Piggyback:1100] Out: -   PHYSICAL EXAMINATION: General: pleasant Neuro:  RASS 0 HEENT:  Pupils reactive 3 Cardiovascular:   s1 s2 Regular, no murmur Lungs: ronchi bilateral Abdomen:  Soft, non tender, G tube site clean Musculoskeletal:  No edema Skin:  No rashes  LABS:  BMET  Recent Labs Lab 06/23/15 0432 06/24/15 0226 06/25/15 0439  NA 141 142 141  K 3.3* 3.1* 3.6  CL 106 103 104  CO2 26 29 28   BUN 10 13 11   CREATININE 0.35* 0.40* 0.36*  GLUCOSE 217* 172* 160*    Electrolytes  Recent Labs Lab 06/20/15 0224  06/22/15 0331 06/23/15 0432 06/24/15 0226 06/25/15 0439  CALCIUM 8.6*  < > 8.4* 7.5* 8.3* 8.4*  MG 1.7  < > 1.9 1.4* 1.7 1.6*  PHOS 1.4*  --  1.5* 3.8  --   --   < > = values in this interval not displayed.  CBC  Recent Labs Lab 06/23/15 0432 06/24/15 0226 06/25/15 0439  WBC 8.1 6.9 7.2  HGB 8.4* 8.1* 8.3*  HCT 27.3* 25.5* 26.7*  PLT 156 159 175    Coag's  Recent Labs Lab 06/20/15 0043 06/20/15 0224 06/21/15 0524  APTT 68* 68* 71*    Sepsis Markers  Recent Labs Lab 06/19/15 1115 06/19/15 1438 06/20/15 0849  LATICACIDVEN 3.2* 3.3* 1.0  PROCALCITON  --   --  <0.10    ABG  Recent Labs Lab 06/19/15 0445  06/20/15 0410 06/23/15 0438  PHART 7.457* 7.485* 7.374  PCO2ART 43.1 37.8 51.9*  PO2ART 111* 111* 90.3    Liver Enzymes  Recent Labs Lab 06/19/15 0110  AST 23  ALT 23  ALKPHOS 55  BILITOT 0.7  ALBUMIN 3.1*    Cardiac Enzymes  Recent Labs Lab 06/19/15 0110  TROPONINI <0.03    Glucose  Recent Labs Lab 06/24/15 1408 06/24/15 1546 06/24/15 1932 06/24/15 2329 06/25/15 0320 06/25/15 0748  GLUCAP 182* 178* 148* 153* 163* 139*    Imaging Dg Chest Port 1 View  06/25/2015  CLINICAL DATA:  Respiratory failure. EXAM: PORTABLE CHEST 1 VIEW COMPARISON:  06/24/2015 and 06/23/2015 FINDINGS: Endotracheal tube in good position. Further clearing of the infiltrate in the right lung. Heart size and vascularity are now normal. Left lung is clear. No effusions. IMPRESSION: Further clearing of the infiltrate in the right lung with slight residual infiltrate at the base. Electronically Signed   By: Kerry [name] M.D.   On: 06/25/2015 09:22    STUDIES:  12/14 CT chest >> RLL PE 12/15 Doppler Lt leg >> DVT Rt calf 12/16 Laryngoscopy >> vocal fold paresis/hypomobility 12/17 CT head >> Lt MCA CVA 12/27 CT head >> ?  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  She was sent to [facility] and then CIR.  She  had RLL PE on 06/07/15.  She developed altered mental status and acute hypoxic/hypercapnic respiratory failure 06/19/15 and transferred back to ICU.  Was difficult intubation.  Found to have MSSA HCAP and E coli UTI.  Self extubated and reintubated 12/31 due to aspiration.  ASSESSMENT / PLAN:  PULMONARY A: Acute hypoxic/hypercapnic respiratory failure. Probable aspiration event 12/31. RLL PE, Lt leg DVT. Vocal cord paresis. P:   Pressure support wean, cpap 5ps 5, goal 2 hrs Follow secretions closely Pontine infarct, left MCA large, cord dysfxn, recurrent asp = trach necessary, will d/w husband F/u CXR Lovenox tx dose  CARDIOVASCULAR A:  Sepsis. Hx of CAD, HTN, HLD. Bradycardia 1/01. P:  KVO IV fluids Hold coreg 1/01 Continue lipitor  Resume prinivil 1/01, follow upper airway  RENAL A:   Hypomagnesemia. Hypophosphatemia. P:   F/u BMET Mag supp  GASTROINTESTINAL A:   Dysphagia s/p G tube. P:   Tube feeds while on vent Protonix  For SUP  HEMATOLOGIC A:   Anemia of critical illness. P:  F/u CBC Check iron levels Consider to heparin short acting  INFECTIOUS A:   Sepsis from MSSA HCAP, E coli UTI. Aspiration pneumonitis 12/31. P:   Narrow off vosyn Add ceftriaxone  ENDOCRINE A:   DM. controlled P:   SSI  NEUROLOGIC A:   Acute encephalopathy 2nd to respiratory failure. ?Lt pontine infarct on CT head 12/27 >> no new CVA on MRI brain 12/28. Recent Lt MCA CVA. P:   RASS goal: 0 Monitor mental status WUA pt  Ccm time 30 min   Kerry [name]. Kerry Mould, MD, [place] Pgr: [place] [facility] & Critical [facility]

## 2015-06-26 ENCOUNTER — Encounter (HOSPITAL_COMMUNITY): Payer: 59

## 2015-06-26 ENCOUNTER — Inpatient Hospital Stay (HOSPITAL_COMMUNITY): Payer: BLUE CROSS/BLUE SHIELD

## 2015-06-26 DIAGNOSIS — I63012 Cerebral infarction due to thrombosis of left vertebral artery: Secondary | ICD-10-CM

## 2015-06-26 DIAGNOSIS — Z4659 Encounter for fitting and adjustment of other gastrointestinal appliance and device: Secondary | ICD-10-CM

## 2015-06-26 DIAGNOSIS — J9602 Acute respiratory failure with hypercapnia: Secondary | ICD-10-CM

## 2015-06-26 DIAGNOSIS — R401 Stupor: Secondary | ICD-10-CM

## 2015-06-26 DIAGNOSIS — J9601 Acute respiratory failure with hypoxia: Secondary | ICD-10-CM

## 2015-06-26 LAB — COMPREHENSIVE METABOLIC PANEL
ALBUMIN: 2.2 g/dL — AB (ref 3.5–5.0)
ALK PHOS: 48 U/L (ref 38–126)
ALT: 32 U/L (ref 14–54)
ANION GAP: 9 (ref 5–15)
AST: 27 U/L (ref 15–41)
BUN: 8 mg/dL (ref 6–20)
CALCIUM: 8.5 mg/dL — AB (ref 8.9–10.3)
CO2: 29 mmol/L (ref 22–32)
CREATININE: 0.32 mg/dL — AB (ref 0.44–1.00)
Chloride: 103 mmol/L (ref 101–111)
GFR calc Af Amer: >60 mL/min (ref >60)
GFR calc non Af Amer: >60 mL/min (ref >60)
GLUCOSE: 126 mg/dL — AB (ref 65–99)
Potassium: 3.2 mmol/L — ABNORMAL LOW (ref 3.5–5.1)
SODIUM: 141 mmol/L (ref 135–145)
Total Bilirubin: 0.5 mg/dL (ref 0.3–1.2)
Total Protein: 5.3 g/dL — ABNORMAL LOW (ref 6.5–8.1)

## 2015-06-26 LAB — CULTURE, RESPIRATORY

## 2015-06-26 LAB — CBC WITH DIFFERENTIAL/PLATELET
BASOS PCT: 0 %
Basophils Absolute: 0 10*3/uL (ref 0.0–0.1)
Eosinophils Absolute: 0.1 10*3/uL (ref 0.0–0.7)
Eosinophils Relative: 2 %
HEMATOCRIT: 28.5 % — AB (ref 36.0–46.0)
Hemoglobin: 9 g/dL — ABNORMAL LOW (ref 12.0–15.0)
Lymphocytes Relative: 33 %
Lymphs Abs: 2.8 10*3/uL (ref 0.7–4.0)
MCH: 28.2 pg (ref 26.0–34.0)
MCHC: 31.6 g/dL (ref 30.0–36.0)
MCV: 89.3 fL (ref 78.0–100.0)
MONO ABS: 0.7 10*3/uL (ref 0.1–1.0)
MONOS PCT: 8 %
NEUTROS ABS: 4.8 10*3/uL (ref 1.7–7.7)
Neutrophils Relative %: 57 %
Platelets: 202 10*3/uL (ref 150–400)
RBC: 3.19 MIL/uL — ABNORMAL LOW (ref 3.87–5.11)
RDW: 15.4 % (ref 11.5–15.5)
WBC: 8.4 10*3/uL (ref 4.0–10.5)

## 2015-06-26 LAB — GLUCOSE, CAPILLARY
GLUCOSE-CAPILLARY: 110 mg/dL — AB (ref 65–99)
GLUCOSE-CAPILLARY: 142 mg/dL — AB (ref 65–99)
GLUCOSE-CAPILLARY: 171 mg/dL — AB (ref 65–99)
Glucose-Capillary: 145 mg/dL — ABNORMAL HIGH (ref 65–99)
Glucose-Capillary: 143 mg/dL — ABNORMAL HIGH (ref 65–99)
Glucose-Capillary: 144 mg/dL — ABNORMAL HIGH (ref 65–99)

## 2015-06-26 LAB — CULTURE, RESPIRATORY W GRAM STAIN: Culture: NO GROWTH

## 2015-06-26 MED ORDER — HEPARIN (PORCINE) IN NACL 100-0.45 UNIT/ML-% IJ SOLN
1900.0000 [IU]/h | INTRAMUSCULAR | Status: DC
  Administered 2015-06-27: 1250 [IU]/h via INTRAVENOUS
  Administered 2015-06-28: 1400 [IU]/h via INTRAVENOUS
  Administered 2015-06-29: 1900 [IU]/h via INTRAVENOUS
  Filled 2015-06-26 (×4): qty 250

## 2015-06-26 MED ORDER — CHLORHEXIDINE GLUCONATE 0.12 % MT SOLN
OROMUCOSAL | Status: AC
  Administered 2015-06-26: 15 mL via OROMUCOSAL
  Filled 2015-06-26: qty 15

## 2015-06-26 MED ORDER — POTASSIUM CHLORIDE 20 MEQ/15ML (10%) PO SOLN
40.0000 meq | Freq: Once | ORAL | Status: AC
Start: 2015-06-26 — End: 2015-06-26
  Administered 2015-06-26: 40 meq
  Filled 2015-06-26: qty 30

## 2015-06-26 NOTE — Progress Notes (Signed)
Nutrition Follow-up  INTERVENTION:   Continue Vital AF 1.2 @ 50 ml/hr via PEJ  Continue 30 ml Prostat daily  Provides: 1540 kcal, 105 grams protein, and 972 ml H2O.  NUTRITION DIAGNOSIS:   Inadequate oral intake related to inability to eat as evidenced by NPO status. Ongoing.   GOAL:   Patient will meet greater than or equal to 90% of their needs Met.   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 She was sent to [facility] and then CIR. She had RLL PE on 06/07/15. She developed altered mental status and acute hypoxic/hypercapnic respiratory failure 06/19/15 and transferred back to ICU.  12/27 intubated 12/31 self-extubated, aspirated, re-intubated 1/3 trach placed  Patient is currently on ventilator support MV: 8 L/min Temp (24hrs), Avg:98.5 F (36.9 C), Min:98 F (36.7 C), Max:99.3 F (37.4 C)  Labs reviewed: potassium low 3.2 CBG's: 143-145 Pt discussed during ICU rounds and with RN.   Diet Order:  Diet NPO time specified  Skin:   (MASD on buttocks)  Last BM:  12/21  Height:   Ht Readings from Last 1 Encounters:  06/19/15 _0  (1.651 m)   Weight:   Wt Readings from Last 1 Encounters:  06/26/15 191 lb 9.3 oz (86.9 kg)  Admission weight 79.1 kg  Ideal Body Weight:  56.8 kg  BMI:  Body mass index is 31.88 kg/(m^2).  Estimated Nutritional Needs:   Kcal:  1588  Protein:  100-115 grams  Fluid:  >/= 1.5 L/day  EDUCATION NEEDS:   No education needs identified at this time  [place], [place], [place] Pager [phone] After Hours Pager

## 2015-06-26 NOTE — Progress Notes (Signed)
PULMONARY / CRITICAL CARE MEDICINE   Name: Kerry [name] MRN: [id number] DOB: May 17, 1956    ADMISSION DATE:  06/19/2015  REFERRING MD:  CIR  CHIEF COMPLAINT: Altered mental status  SUBJECTIVE:  Alert weaning  VITAL SIGNS: BP 150/67 mmHg  Pulse 65  Temp(Src) 99 F (37.2 C) (Axillary)  Resp 11  Ht 5\' 5"  (1.651 m)  Wt 191 lb 9.3 oz (86.9 kg)  BMI 31.88 kg/m2  SpO2 100%  VENTILATOR SETTINGS: Vent Mode:  [-] CPAP;PSV FiO2 (%):  [40 %] 40 % Set Rate:  [14 bmp] 14 bmp Vt Set:  [550 mL] 550 mL PEEP:  [5 cmH20] 5 cmH20 Pressure Support:  [8 cmH20-12 cmH20] 12 cmH20 Plateau Pressure:  [15 cmH20-18 cmH20] 17 cmH20  INTAKE / OUTPUT: I/O last 3 completed shifts: In: 2671.9 [I.V.:421.9; Other:150; NG/GT:1450; IV Piggyback:650] Out: -   PHYSICAL EXAMINATION: General: pleasant, follows commands Neuro:  RASS 0 HEENT:  Pupils reactive 3 Cardiovascular:   s1 s2 Regular, no murmur Lungs: ronchi bilateral Abdomen:  Soft, non tender, G tube site clean Musculoskeletal:  No edema Skin:  No rashes  LABS:  BMET  Recent Labs Lab 06/24/15 0226 06/25/15 0439 06/26/15 0400  NA 142 141 141  K 3.1* 3.6 3.2*  CL 103 104 103  CO2 29 28 29   BUN 13 11 8   CREATININE 0.40* 0.36* 0.32*  GLUCOSE 172* 160* 126*    Electrolytes  Recent Labs Lab 06/20/15 0224  06/22/15 0331 06/23/15 0432 06/24/15 0226 06/25/15 0439 06/26/15 0400  CALCIUM 8.6*  < > 8.4* 7.5* 8.3* 8.4* 8.5*  MG 1.7  < > 1.9 1.4* 1.7 1.6*  --   PHOS 1.4*  --  1.5* 3.8  --   --   --   < > = values in this interval not displayed.  CBC  Recent Labs Lab 06/24/15 0226 06/25/15 0439 06/26/15 0400  WBC 6.9 7.2 8.4  HGB 8.1* 8.3* 9.0*  HCT 25.5* 26.7* 28.5*  PLT 159 175 202    Coag's  Recent Labs Lab 06/20/15 0224 06/21/15 0524 06/25/15 1125  APTT 68* 71* 29  INR  --   --  1.10    Sepsis Markers  Recent Labs Lab 06/19/15 1115 06/19/15 1438 06/20/15 0849  LATICACIDVEN 3.2* 3.3* 1.0   PROCALCITON  --   --  <0.10    ABG  Recent Labs Lab 06/20/15 0410 06/23/15 0438  PHART 7.485* 7.374  PCO2ART 37.8 51.9*  PO2ART 111* 90.3    Liver Enzymes  Recent Labs Lab 06/26/15 0400  AST 27  ALT 32  ALKPHOS 48  BILITOT 0.5  ALBUMIN 2.2*    Cardiac Enzymes No results for input(s): TROPONINI, PROBNP in the last 168 hours.  Glucose  Recent Labs Lab 06/25/15 1205 06/25/15 1649 06/25/15 2009 06/25/15 2348 06/26/15 0358 06/26/15 0803  GLUCAP 156* 148* 144* 171* 110* 145*    Imaging Dg Chest Port 1 View  06/26/2015  CLINICAL DATA:  Hypoxia. EXAM: PORTABLE CHEST 1 VIEW COMPARISON:  June 25, 2015 FINDINGS: Endotracheal tube tip is 1.2 cm above the carina. No pneumothorax. There is interstitial edema in the lung base regions. There is no airspace consolidation. There is cardiomegaly. The pulmonary vascularity is normal. No adenopathy. No bone lesions. IMPRESSION: Bibasilar interstitial edema with mild cardiac enlargement. Suspect a degree of congestive heart failure. Currently there is no airspace consolidation appreciable. Endotracheal tube tip is 1.2 cm above the carina. No pneumothorax. It may be prudent to consider withdrawing  endotracheal tube 2 to 3 cm. Electronically Signed   By: Lowella Grip III M.D.   On: 06/26/2015 07:53   Dg Chest Port 1 View  06/25/2015  CLINICAL DATA:  Respiratory distress. EXAM: PORTABLE CHEST 1 VIEW COMPARISON:  Earlier today at 712 hours FINDINGS: 1611 hours Endotracheal tube terminates 2.0 cm above carina.Patient rotated right. Cardiomegaly accentuated by AP portable technique. No pleural effusion or pneumothorax. Low lung volumes with resultant pulmonary interstitial prominence. Persistent right base airspace disease. Clear left lung. IMPRESSION: No change in right base airspace disease. Cardiomegaly and low lung volumes, without congestive failure. Electronically Signed   By: Abigail Miyamoto M.D.   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  She was sent to [facility] and then CIR.  She had RLL PE on 06/07/15.  She developed altered mental status and acute hypoxic/hypercapnic respiratory failure 06/19/15 and transferred back to ICU.  Was difficult intubation.  Found to have MSSA HCAP and E coli UTI.  Self extubated and reintubated 12/31 due to aspiration.  ASSESSMENT / PLAN:  PULMONARY A: Acute hypoxic/hypercapnic respiratory failure. Probable aspiration event 12/31. RLL PE, Lt leg DVT. Vocal cord paresis. P:   Pressure support wean, cpap 5ps 5, goal 2 hrs Follow secretions closely Pontine infarct, left MCA large, cord dysfxn, recurrent asp = trach necessary,planned for 1/4 F/u CXR Lovenox tx dose, dc  CARDIOVASCULAR A:  Sepsis. Hx of CAD, HTN, HLD. Bradycardia 1/01. P:  KVO IV fluids Hold coreg  Continue lipitor  Resume prinivil 1/01, follow upper airway  RENAL A:   Hypok Hypophosphatemia. P:   F/u BMET 1/4 k supp  GASTROINTESTINAL A:   Dysphagia s/p G tube. P:   Tube feeds while on vent Protonix  For  SUP  HEMATOLOGIC  Recent Labs  06/25/15 0439 06/26/15 0400  HGB 8.3* 9.0*    A:   Anemia of critical illness. P:  F/u CBC Consider to heparin short acting(on lmwh)  INFECTIOUS A:   Sepsis from MSSA HCAP, E coli UTI. Aspiration pneumonitis 12/31. P:   Added ceftriaxone 1/2 aspiration   ENDOCRINE A:   DM. controlled P:   SSI  NEUROLOGIC A:   Acute encephalopathy 2nd to respiratory failure. ?Lt pontine infarct on CT head 12/27 >> no new CVA on MRI brain 12/28. Recent Lt MCA CVA. P:   Currently awake and follows commands. On ps 5, has been trached before and trach is planned for today 1/3  [facility] ACNP Maryanna Shape PCCM Pager [phone] till 3 pm If no answer page [phone] 06/26/2015, 10:35 AM    STAFF NOTE: Denice Bors  [name], [place] have personally reviewed patient's available data, including medical history, events of note, physical examination and test results as part of my evaluation. I have discussed with resident/NP and other care providers such as pharmacist, RN and RRT. In addition, I personally evaluated patient and elicited key findings of: awake, pcxr improved, rt base infiltrates improved, ETT now 7.5, lovenox given in am , move trach to wed 3 pm, dc lovenox, add heparin transition, dc heparin noon wed, wean cpap 5 ps 5, goal 2 hr then ps increase, would NOT extubate, TF , wua, supp k  The patient is critically ill with multiple organ systems failure and requires high complexity decision making for assessment and support, frequent evaluation and titration of therapies, application of advanced monitoring technologies and extensive interpretation of multiple databases.   Critical Care Time devoted to patient care services described in this note is30 Minutes. This time reflects time of care of this signee: Merrie Roof, MD FACP. This critical care time does not reflect procedure time, or teaching time or supervisory time of PA/NP/Med student/Med Resident  etc but could involve care discussion time. Rest per NP/medical resident whose note is outlined above and that I agree with   [name]. [name], MD, [place] Pgr: [place] [facility] 06/26/2015 12:09 PM

## 2015-06-26 NOTE — Progress Notes (Addendum)
ANTICOAGULATION CONSULT NOTE - Follow Up Consult  Pharmacy Consult for Lovenox >> Heparin IV Indication: pulmonary embolus  Allergies  Allergen Reactions  . Codeine Nausea Only    Nausea?-- "made her sick"  . Oxycodone Nausea Only    Sick   . Oxycontin [Oxycodone Hcl] Nausea Only    Sick     Patient Measurements: Height: 5\' 5"  (165.1 cm) Weight: 191 lb 9.3 oz (86.9 kg) IBW/kg (Calculated) : 57  HDW: 73.6kg  Vital Signs: Temp: 99.3 F (37.4 C) (01/03 1200) Temp Source: Axillary (01/03 1200) BP: 140/60 mmHg (01/03 1235) Pulse Rate: 74 (01/03 1235)  Labs:  Recent Labs  06/24/15 0226 06/25/15 0439 06/25/15 1125 06/26/15 0400  HGB 8.1* 8.3*  --  9.0*  HCT 25.5* 26.7*  --  28.5*  PLT 159 175  --  202  APTT  --   --  29  --   LABPROT  --   --  14.4  --   INR  --   --  1.10  --   CREATININE 0.40* 0.36*  --  0.32*    Estimated Creatinine Clearance: 83.5 mL/min (by C-G formula based on Cr of 0.32).   Assessment: 59yof on therapeutic lovenox (~1.5mg /kg q24h dosing) for PE (CT angio 12/15) to transition to heparin IV on 1/3, last lovenox dose this AM at 1112. Hg low stable, plt wnl. No bleeding reported. On Xarelto pta. Will start IV heparin without bolus after completion of ~75% of lovenox dosing interval per protocol. Will check baseline HL in order to account for potential residual effect of lovenox.  Goal of Therapy:  Heparin Level 0.3-0.7 Monitor platelets by anticoagulation protocol: Yes  Plan:  Lovenox Tx >> Heparin at 1250 units/h 1/4 at 0500 No bolus Baseline HL at 0500, then 8h HL Daily HL/CBC Mon s/sx bleeding  Elicia Lamp, PharmD, BCPS Clinical Pharmacist Pager [phone] 06/26/2015 1:17 PM

## 2015-06-27 ENCOUNTER — Inpatient Hospital Stay (HOSPITAL_COMMUNITY): Payer: BLUE CROSS/BLUE SHIELD

## 2015-06-27 DIAGNOSIS — I6350 Cerebral infarction due to unspecified occlusion or stenosis of unspecified cerebral artery: Secondary | ICD-10-CM | POA: Diagnosis present

## 2015-06-27 DIAGNOSIS — I6300 Cerebral infarction due to thrombosis of unspecified precerebral artery: Secondary | ICD-10-CM

## 2015-06-27 DIAGNOSIS — I639 Cerebral infarction, unspecified: Secondary | ICD-10-CM | POA: Diagnosis present

## 2015-06-27 LAB — BASIC METABOLIC PANEL
ANION GAP: 7 (ref 5–15)
BUN: 8 mg/dL (ref 6–20)
CHLORIDE: 105 mmol/L (ref 101–111)
CO2: 29 mmol/L (ref 22–32)
Calcium: 8.6 mg/dL — ABNORMAL LOW (ref 8.9–10.3)
Creatinine, Ser: <0.3 mg/dL — ABNORMAL LOW (ref 0.44–1.00)
GLUCOSE: 171 mg/dL — AB (ref 65–99)
POTASSIUM: 3.2 mmol/L — AB (ref 3.5–5.1)
Sodium: 141 mmol/L (ref 135–145)

## 2015-06-27 LAB — GLUCOSE, CAPILLARY
GLUCOSE-CAPILLARY: 147 mg/dL — AB (ref 65–99)
GLUCOSE-CAPILLARY: 152 mg/dL — AB (ref 65–99)
GLUCOSE-CAPILLARY: 111 mg/dL — AB (ref 65–99)
GLUCOSE-CAPILLARY: 135 mg/dL — AB (ref 65–99)
Glucose-Capillary: 146 mg/dL — ABNORMAL HIGH (ref 65–99)
Glucose-Capillary: 164 mg/dL — ABNORMAL HIGH (ref 65–99)

## 2015-06-27 LAB — CBC
HEMATOCRIT: 28.3 % — AB (ref 36.0–46.0)
HEMOGLOBIN: 8.7 g/dL — AB (ref 12.0–15.0)
MCH: 27.6 pg (ref 26.0–34.0)
MCHC: 30.7 g/dL (ref 30.0–36.0)
MCV: 89.8 fL (ref 78.0–100.0)
PLATELETS: 215 10*3/uL (ref 150–400)
RBC: 3.15 MIL/uL — AB (ref 3.87–5.11)
RDW: 15.5 % (ref 11.5–15.5)
WBC: 7.2 10*3/uL (ref 4.0–10.5)

## 2015-06-27 LAB — PHOSPHORUS: Phosphorus: 3.7 mg/dL (ref 2.5–4.6)

## 2015-06-27 LAB — HEPARIN LEVEL (UNFRACTIONATED): Heparin Unfractionated: 0.26 IU/mL — ABNORMAL LOW (ref 0.30–0.70)

## 2015-06-27 LAB — MAGNESIUM: Magnesium: 1.6 mg/dL — ABNORMAL LOW (ref 1.7–2.4)

## 2015-06-27 MED ORDER — MAGNESIUM SULFATE 2 GM/50ML IV SOLN
2.0000 g | Freq: Once | INTRAVENOUS | Status: AC
  Administered 2015-06-27: 2 g via INTRAVENOUS
  Filled 2015-06-27: qty 50

## 2015-06-27 MED ORDER — MIDAZOLAM HCL 2 MG/2ML IJ SOLN
INTRAMUSCULAR | Status: AC
  Administered 2015-06-27: 2 mg
  Filled 2015-06-27: qty 4

## 2015-06-27 MED ORDER — PROPOFOL 1000 MG/100ML IV EMUL
INTRAVENOUS | Status: AC
  Administered 2015-06-27: 1000 mg
  Filled 2015-06-27: qty 100

## 2015-06-27 MED ORDER — POTASSIUM CHLORIDE 20 MEQ/15ML (10%) PO SOLN
40.0000 meq | Freq: Once | ORAL | Status: AC
  Administered 2015-06-27: 40 meq
  Filled 2015-06-27: qty 30

## 2015-06-27 MED ORDER — FENTANYL CITRATE (PF) 100 MCG/2ML IJ SOLN
INTRAMUSCULAR | Status: AC
  Filled 2015-06-27: qty 4

## 2015-06-27 NOTE — Progress Notes (Signed)
[facility] Progress Note Patient Name: Kerry [name] DOB: 1955/09/05 MRN: [id number]   Date of Service  06/27/2015  HPI/Events of Note  Patient post tracheostomy. RN questioning if ok to restart heparin drip.  eICU Interventions  Ok to restart heparin drip without bolus now. Pharmacy protocol in place.     Intervention Category Major Interventions: Other:  Tera Partridge 06/27/2015, 8:21 PM

## 2015-06-27 NOTE — Progress Notes (Signed)
ANTICOAGULATION CONSULT NOTE - Follow Up Consult  Pharmacy Consult for Heparin IV Indication: pulmonary embolus  Allergies  Allergen Reactions  . Codeine Nausea Only    Nausea?-- "made her sick"  . Oxycodone Nausea Only    Sick   . Oxycontin [Oxycodone Hcl] Nausea Only    Sick     Patient Measurements: Height: 5\' 5"  (165.1 cm) Weight: 192 lb 0.3 oz (87.1 kg) IBW/kg (Calculated) : 57  HDW: 73.6kg  Vital Signs: Temp: 98 F (36.7 C) (01/04 0400) Temp Source: Oral (01/04 0400) BP: 123/56 mmHg (01/04 0700) Pulse Rate: 49 (01/04 0700)  Labs:  Recent Labs  06/25/15 0439 06/25/15 1125 06/26/15 0400 06/27/15 0557  HGB 8.3*  --  9.0* 8.7*  HCT 26.7*  --  28.5* 28.3*  PLT 175  --  202 215  APTT  --  29  --   --   LABPROT  --  14.4  --   --   INR  --  1.10  --   --   HEPARINUNFRC  --   --   --  0.26*  CREATININE 0.36*  --  0.32* <0.30*    CrCl cannot be calculated (Patient has no serum creatinine result on file.).   Assessment: 59yof on therapeutic lovenox (~1.5mg /kg q24h dosing) for PE (CT angio 12/15) to transition to heparin IV on 1/3, last lovenox dose this AM at 1112. Hg low stable, plt wnl. No bleeding reported. On Xarelto pta. D/c heparin at 1200 for trach per CCM. Baseline HL 0.26 this AM ~1h after heparin start.  H/H slow trend down but relatively stable, plt wnl and stable; no bleeding noted  Goal of Therapy:  Heparin Level 0.3-0.7 Monitor platelets by anticoagulation protocol: Yes  Plan:  Heparin at 1250 units/h (no bolus) - off at 1200 per CCM for trach 8h HL (may need to re-schedule) Daily HL/CBC Mon s/sx bleeding  Elicia Lamp, PharmD, [facility] Clinical Pharmacist Pager [phone] 06/27/2015 7:39 AM

## 2015-06-27 NOTE — Progress Notes (Signed)
B.Flowers,RRT notified of rate, FIO2 and alarm limit changes made during procedure

## 2015-06-27 NOTE — Progress Notes (Signed)
PULMONARY / CRITICAL CARE MEDICINE   Name: Kerry [name] MRN: [id number] DOB: 1956/02/20    ADMISSION DATE:  06/19/2015  REFERRING MD:  CIR  CHIEF COMPLAINT: Altered mental status   CULTURES: 12/27 Blood >>neg 12/27 Urine >> E coli 12/27 Sputum >> MSSA 12/31 Sputum >>neg  ANTIBIOTICS: 12/27 Vancomycin >> 12/29 12/27 Fortaz >> 12/29 12/29 Ancef >> 12/31 12/31 Vancomycin >>1/2 12/31 Zosyn >>1/2 Ceftriaxone 1/2>>>  SIGNIFICANT EVENTS: 12/14 CT chest >> RLL PE 12/15 Doppler Lt leg >> DVT Rt calf 12/16 Laryngoscopy >> vocal fold paresis/hypomobility 12/17 CT head >> Lt MCA CVA ................. 12/27 Transfer to ICU. 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 06/26/15 - Alert weaning   SUBJECTIVE/OVERNIGHT/INTERVAL HX 06/27/15 - For trach 06/27/2015. Following commands. RN reports prolonged urinary retention   VITAL SIGNS: BP 143/63 mmHg  Pulse 74  Temp(Src) 98 F (36.7 C) (Oral)  Resp 13  Ht 5\' 5"  (1.651 m)  Wt 87.1 kg (192 lb 0.3 oz)  BMI 31.95 kg/m2  SpO2 100%  VENTILATOR SETTINGS: Vent Mode:  [-] PRVC FiO2 (%):  [40 %] 40 % Set Rate:  [14 bmp] 14 bmp Vt Set:  [550 mL] 550 mL PEEP:  [5 cmH20] 5 cmH20 Pressure Support:  [5 cmH20-8 cmH20] 8 cmH20 Plateau Pressure:  [14 cmH20-17 cmH20] 14 cmH20  INTAKE / OUTPUT: I/O last 3 completed shifts: In: 1487.7 [I.V.:426.9; Other:60; NG/GT:950.8; IV Piggyback:50] Out: -   PHYSICAL EXAMINATION: General: pleasant, follows commands Neuro:  RASS 0. Follows commands. HEENT:  Pupils reactive 3 Cardiovascular:   s1 s2 Regular, no murmur Lungs: ronchi bilateral Abdomen:  Soft, non tender, G tube site clean Musculoskeletal:  No edema Skin:  No  rashes  LABS: PULMONARY  Recent Labs Lab 06/23/15 0438  PHART 7.374  PCO2ART 51.9*  PO2ART 90.3  HCO3 29.6*  TCO2 31.2  O2SAT 97.1    CBC  Recent Labs Lab 06/25/15 0439 06/26/15 0400 06/27/15 0557  HGB 8.3* 9.0* 8.7*  HCT 26.7* 28.5* 28.3*  WBC 7.2 8.4 7.2  PLT 175 202 215    COAGULATION  Recent Labs Lab 06/25/15 1125  INR 1.10    CARDIAC  No results for input(s): TROPONINI in the last 168 hours. No results for input(s): PROBNP in the last 168 hours.   CHEMISTRY  Recent Labs Lab 06/22/15 0331 06/23/15 0432 06/24/15 0226 06/25/15 0439 06/26/15 0400 06/27/15 0557  NA 141 141 142 141 141 141  K 3.8 3.3* 3.1* 3.6 3.2* 3.2*  CL 108 106 103 104 103 105  CO2 27 26 29 28 29 29   GLUCOSE 167* 217* 172* 160* 126* 171*  BUN 10 10 13 11 8 8   CREATININE 0.38* 0.35* 0.40* 0.36* 0.32* <0.30*  CALCIUM 8.4* 7.5* 8.3* 8.4* 8.5* 8.6*  MG 1.9 1.4* 1.7 1.6*  --  1.6*  PHOS 1.5* 3.8  --   --   --  3.7   CrCl cannot be calculated (Patient has no serum creatinine result on file.).   LIVER  Recent Labs Lab 06/25/15 1125 06/26/15 0400  AST  --  27  ALT  --  32  ALKPHOS  --  48  BILITOT  --  0.5  PROT  --  5.3*  ALBUMIN  --  2.2*  INR 1.10  --      INFECTIOUS No results for input(s): LATICACIDVEN, PROCALCITON in the last 168 hours.   ENDOCRINE CBG (last 3)   Recent Labs  06/26/15 2318 06/27/15 0356 06/27/15 0824  GLUCAP 147* 152* 146*         IMAGING x48h  - image(s) personally visualized  -   highlighted in bold Dg Chest Port 1 View  06/26/2015  CLINICAL DATA:  Respiratory failure. Subsequent encounter. History of hypertension and diabetes. EXAM: PORTABLE CHEST 1 VIEW COMPARISON:  06/26/2015 at 6:11 a.m. FINDINGS: Interstitial opacities and patchy lung base airspace opacities have improved consistent with improved pulmonary edema. No new lung opacities. No convincing pleural effusion and no pneumothorax. Endotracheal tube now projects 1.7 cm  above the carina. IMPRESSION: Significantly improved lung aeration since the earlier study consistent with improved pulmonary edema. No new abnormalities. Electronically Signed   By: Lajean Manes M.D.   On: 06/26/2015 12:21   Dg Chest Port 1 View  06/26/2015  CLINICAL DATA:  Hypoxia. EXAM: PORTABLE CHEST 1 VIEW COMPARISON:  June 25, 2015 FINDINGS: Endotracheal tube tip is 1.2 cm above the carina. No pneumothorax. There is interstitial edema in the lung base regions. There is no airspace consolidation. There is cardiomegaly. The pulmonary vascularity is normal. No adenopathy. No bone lesions. IMPRESSION: Bibasilar interstitial edema with mild cardiac enlargement. Suspect a degree of congestive heart failure. Currently there is no airspace consolidation appreciable. Endotracheal tube tip is 1.2 cm above the carina. No pneumothorax. It may be prudent to consider withdrawing endotracheal tube 2 to 3 cm. Electronically Signed   By: Lowella Grip III M.D.   On: 06/26/2015 07:53   Dg Chest Port 1 View  06/25/2015  CLINICAL DATA:  Respiratory distress. EXAM: PORTABLE CHEST 1 VIEW COMPARISON:  Earlier today at 712 hours FINDINGS: 1611 hours Endotracheal tube terminates 2.0 cm above carina.Patient rotated right. Cardiomegaly accentuated by AP portable technique. No pleural effusion or pneumothorax. Low lung volumes with resultant pulmonary interstitial prominence. Persistent right base airspace disease. Clear left lung. IMPRESSION: No change in right base airspace disease. Cardiomegaly and low lung volumes, without congestive failure. Electronically Signed   By: Abigail Miyamoto M.D.   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  She was sent to [facility] and then CIR.  She had RLL PE on 06/07/15.  She developed altered mental status and acute hypoxic/hypercapnic respiratory failure 06/19/15 and transferred back to ICU.  Was difficult  intubation.  Found to have MSSA HCAP and E coli UTI.  Self extubated and reintubated 12/31 due to aspiration.  ASSESSMENT / PLAN:  PULMONARY A: Acute hypoxic/hypercapnic respiratory failure. Probable aspiration event 12/31. RLL PE, Lt leg DVT. Vocal cord paresis.   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 Hx of CAD, HTN, HLD. Bradycardia 1/01.   - nil acute P:  KVO IV fluids Hold coreg  Continue lipitor  Resume prinivil 1/01, follow upper airway  RENAL A:   Hypokalemia and hypomagnesemia . P:   Replete mag and K   GASTROINTESTINAL A:   Dysphagia s/p G tube. P:   Tube feeds while on vent - wil be held for trach Protonix  For SUP  HEMATOLOGIC  Recent Labs  06/26/15 0400 06/27/15 0557  HGB 9.0* 8.7*    A:   Anemia of critical illness. P:  F/u CBC Contin IV heparin - hold for trach  INFECTIOUS A:   Sepsis from MSSA HCAP, E coli UTI. Aspiration pneumonitis 12/31. P:   Added ceftriaxone 06/25/15  aspiration   ENDOCRINE A:   DM. controlled P:   SSI  NEUROLOGIC A:   Acute encephalopathy 2nd to respiratory failure. ?Lt pontine infarct on CT head 12/27 >> no new CVA on MRI brain 12/28. Recent Lt MCA CVA. P:   Currently awake and follows commands. On ps 5, has been trached before and trach is planned for 06/27/15      .  Rest per NP/medical resident whose note is outlined above and that I agree with  The patient is critically ill with multiple organ systems failure and requires high complexity decision making for assessment and support, frequent evaluation and titration of therapies, application of advanced monitoring technologies and extensive interpretation of multiple databases.   Critical Care Time devoted to patient care services described in this note is  30  Minutes. This time  reflects time of care of this signee Dr Brand Males. This critical care time does not reflect procedure time, or teaching time or supervisory time of PA/NP/Med student/Med Resident etc but could involve care discussion time    Dr. Brand Males, M.D., [facility].C.P Pulmonary and Critical Care Medicine Staff Physician [place] [facility] Pager: [phone], If no answer or between  15:00h - 7:00h: call [phone]  06/27/2015 9:21 AM

## 2015-06-27 NOTE — Procedures (Signed)
Bronchoscopy Procedure Note STARKISHA MERCIER [id number] 1956/02/24  Procedure: Bronchoscopy Indications: Diagnostic evaluation of the airways, Obtain specimens for culture and/or other diagnostic studies and Remove secretions  Procedure Details Consent: Risks of procedure as well as the alternatives and risks of each were explained to the (patient/caregiver).  Consent for procedure obtained. Time Out: Verified patient identification, verified procedure, site/side was marked, verified correct patient position, special equipment/implants available, medications/allergies/relevent history reviewed, required imaging and test results available.  Performed  In preparation for procedure, patient was given 100% FiO2 and bronchoscope lubricated. Sedation: Benzodiazepines, Etomidate and fentanyl, propofol  Airway entered and the following bronchi were examined: upper airway to carina.  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   Procedures performed:  Direct visualized placement of tracheostomy Bronchoscope removed.    Evaluation Hemodynamic Status: BP stable throughout; O2 sats: stable throughout Patient's Current Condition: stable Specimens:  None Complications: No apparent complications Patient did tolerate procedure well.   Procedure performed under direct supervision of Dr. [name] and with ultrasound guidance for real time vessel cannulation.      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 [name], MD, [place] Pgr: [place] [facility]

## 2015-06-27 NOTE — Procedures (Signed)
Name:  Kerry [name] MRN:  [id number] DOB:  [birth year]  OPERATIVE NOTE  Procedure:  Percutaneous tracheostomy- re do  Indications:  Ventilator-dependent respiratory failure.  Consent:  Procedure, alternatives, risks and benefits discussed with medical POA.  Questions answered.  Consent obtained.  Anesthesia:  Versed, fent drip, prop  Procedure summary:  Appropriate equipment was assembled.  The patient was identified as Kerry [name] and safety timeout was performed. The patient was placed in supine position with a towel roll behind shoulder blades and neck extended.  Sterile technique was used. The patient's neck and upper chest were prepped using chlorhexidine / alcohol scrub and the field was draped in usual sterile fashion with full body drape. After the adequate sedation / anesthesia was achieved, attention was directed at the midline trachea, where the cricothyroid membrane was palpated. Approximately two fingerbreadths above the sternal notch, a verticle incision was created with a scalpel after local infiltration with 0.2% Lidocaine right over and through prior trach site scar. Then, using Seldinger technique and a percutaneous tracheostomy set, the trachea was entered with a 14 gauge needle with an overlying sheath. This was all confirmed under direct visualization of a fiberoptic flexible bronchoscope. Entrance into the trachea was identified through the third tracheal ring interspace. Following this, a guidewire was inserted. The needle was removed, leaving the sheath and the guidewire intact. Next, the sheath was removed and a small dilator was inserted. The tracheal rings were then dilated. A #6 Shiley was then opened. The balloon was checked. It was placed over a tracheal dilator, which was then advanced over the guidewire and through the previously dilated tract. The Shiley tracheostomy tube was noted to pass in the trachea with little resistance. The guidewire and dilator  tubes were removed from the trachea. An inner cannula was placed through the tracheostomy tube. The tracheostomy was then secured at the anterior neck with 4 monofilament sutures. The oral endotracheal tube was removed and the ventilator was attached to the newly placed tracheostomy tube. Adequate tidal volumes were noted. The cuff was inflated and no evidence of air leak was noted. No evidence of bleeding was noted. At this point, the procedure was concluded. Post-procedure chest x-ray was ordered.  Complications:  No immediate complications were noted.  Hemodynamic parameters and oxygenation remained stable throughout the procedure.  Estimated blood loss:  Less then 1 mL.  [name]., MD Pulmonary and [place] Pager: [phone]  06/27/2015, 2:27 PM   Can follow up in trach clinic [phone]

## 2015-06-27 NOTE — Progress Notes (Signed)
Could not do 12:00 vent check because pt was having a procedure done

## 2015-06-27 NOTE — Procedures (Signed)
Bedside Tracheostomy Insertion Procedure Note   Patient Details:   Name: Kerry [name] DOB: [birth year] MRN: [id number]  Procedure: Tracheostomy  Pre Procedure Assessment: ET Tube Size:7.5 ET Tube secured at lip (cm):24 Bite block in place: No Breath Sounds: Rhonch  Post Procedure Assessment: BP 144/78 mmHg  Pulse 46  Temp(Src) 98.5 F (36.9 C) (Axillary)  Resp 20  Ht 5\' 5"  (1.651 m)  Wt 192 lb 0.3 oz (87.1 kg)  BMI 31.95 kg/m2  SpO2 100% O2 sats: stable throughout Complications: No apparent complications Patient did tolerate procedure well Tracheostomy Brand:Shiley Tracheostomy Style:Cuffed Tracheostomy Size: 6.0 Tracheostomy Secured [id number] and velcro Tracheostomy Placement Confirmation:Trach cuff visualized and in place and Chest X ray ordered for placement    Cherre Huger A [id number], 0000000 PM

## 2015-06-28 DIAGNOSIS — J9620 Acute and chronic respiratory failure, unspecified whether with hypoxia or hypercapnia: Secondary | ICD-10-CM

## 2015-06-28 LAB — CBC
HCT: 28.2 % — ABNORMAL LOW (ref 36.0–46.0)
Hemoglobin: 8.8 g/dL — ABNORMAL LOW (ref 12.0–15.0)
MCH: 27.7 pg (ref 26.0–34.0)
MCHC: 31.2 g/dL (ref 30.0–36.0)
MCV: 88.7 fL (ref 78.0–100.0)
Platelets: 211 10*3/uL (ref 150–400)
RBC: 3.18 MIL/uL — ABNORMAL LOW (ref 3.87–5.11)
RDW: 15.6 % — ABNORMAL HIGH (ref 11.5–15.5)
WBC: 7.9 10*3/uL (ref 4.0–10.5)

## 2015-06-28 LAB — HEPARIN LEVEL (UNFRACTIONATED)
HEPARIN UNFRACTIONATED: 0.14 [IU]/mL — AB (ref 0.30–0.70)
Heparin Unfractionated: <0.1 IU/mL — ABNORMAL LOW (ref 0.30–0.70)
Heparin Unfractionated: <0.1 IU/mL — ABNORMAL LOW (ref 0.30–0.70)

## 2015-06-28 LAB — BASIC METABOLIC PANEL
Anion gap: 7 (ref 5–15)
BUN: 9 mg/dL (ref 6–20)
CO2: 27 mmol/L (ref 22–32)
Calcium: 8.7 mg/dL — ABNORMAL LOW (ref 8.9–10.3)
Chloride: 106 mmol/L (ref 101–111)
Creatinine, Ser: 0.42 mg/dL — ABNORMAL LOW (ref 0.44–1.00)
GFR calc Af Amer: >60 mL/min (ref >60)
GFR calc non Af Amer: >60 mL/min (ref >60)
Glucose, Bld: 171 mg/dL — ABNORMAL HIGH (ref 65–99)
Potassium: 3.4 mmol/L — ABNORMAL LOW (ref 3.5–5.1)
Sodium: 140 mmol/L (ref 135–145)

## 2015-06-28 LAB — GLUCOSE, CAPILLARY
GLUCOSE-CAPILLARY: 129 mg/dL — AB (ref 65–99)
GLUCOSE-CAPILLARY: 135 mg/dL — AB (ref 65–99)
GLUCOSE-CAPILLARY: 147 mg/dL — AB (ref 65–99)
GLUCOSE-CAPILLARY: 173 mg/dL — AB (ref 65–99)
Glucose-Capillary: 138 mg/dL — ABNORMAL HIGH (ref 65–99)
Glucose-Capillary: 155 mg/dL — ABNORMAL HIGH (ref 65–99)

## 2015-06-28 LAB — MAGNESIUM: Magnesium: 1.8 mg/dL (ref 1.7–2.4)

## 2015-06-28 LAB — PHOSPHORUS: Phosphorus: 3.9 mg/dL (ref 2.5–4.6)

## 2015-06-28 MED ORDER — CETYLPYRIDINIUM CHLORIDE 0.05 % MT LIQD
7.0000 mL | Freq: Two times a day (BID) | OROMUCOSAL | Status: DC
  Administered 2015-06-29 – 2015-07-09 (×21): 7 mL via OROMUCOSAL

## 2015-06-28 MED ORDER — CHLORHEXIDINE GLUCONATE 0.12 % MT SOLN
15.0000 mL | Freq: Two times a day (BID) | OROMUCOSAL | Status: DC
  Administered 2015-06-28 – 2015-07-09 (×21): 15 mL via OROMUCOSAL
  Filled 2015-06-28 (×19): qty 15

## 2015-06-28 MED ORDER — POTASSIUM CHLORIDE 20 MEQ/15ML (10%) PO SOLN
40.0000 meq | Freq: Once | ORAL | Status: AC
  Administered 2015-06-28: 40 meq
  Filled 2015-06-28: qty 30

## 2015-06-28 MED ORDER — MAGNESIUM SULFATE 2 GM/50ML IV SOLN
2.0000 g | Freq: Once | INTRAVENOUS | Status: AC
  Administered 2015-06-28: 2 g via INTRAVENOUS
  Filled 2015-06-28: qty 50

## 2015-06-28 MED ORDER — FENTANYL CITRATE (PF) 100 MCG/2ML IJ SOLN
25.0000 ug | INTRAMUSCULAR | Status: DC | PRN
  Administered 2015-07-06 – 2015-07-08 (×5): 25 ug via INTRAVENOUS
  Filled 2015-06-28 (×5): qty 2

## 2015-06-28 MED ORDER — VITAL AF 1.2 CAL PO LIQD
1000.0000 mL | ORAL | Status: DC
  Administered 2015-06-28 – 2015-07-02 (×5): 1000 mL
  Filled 2015-06-28 (×8): qty 1000

## 2015-06-28 NOTE — Progress Notes (Signed)
ANTICOAGULATION CONSULT NOTE - Follow Up Consult  Pharmacy Consult for Heparin  Indication: pulmonary embolus  Allergies  Allergen Reactions  . Codeine Nausea Only    Nausea?-- "made her sick"  . Oxycodone Nausea Only    Sick   . Oxycontin [Oxycodone Hcl] Nausea Only    Sick     Patient Measurements: Height: 5\' 5"  (165.1 cm) Weight: 189 lb 2.5 oz (85.8 kg) IBW/kg (Calculated) : 57   HDWt: 73.6kg  Vital Signs: Temp: 98.4 F (36.9 C) (01/05 0831) Temp Source: Oral (01/05 0831) BP: 143/52 mmHg (01/05 1007) Pulse Rate: 75 (01/05 1150)  Labs:  Recent Labs  06/26/15 0400  06/27/15 0557 06/27/15 1325 06/28/15 0310 06/28/15 1201  HGB 9.0*  --  8.7*  --  8.8*  --   HCT 28.5*  --  28.3*  --  28.2*  --   PLT 202  --  215  --  211  --   HEPARINUNFRC  --   < > 0.26* <0.10* <0.10* 0.14*  CREATININE 0.32*  --  <0.30*  --  0.42*  --   < > = values in this interval not displayed.  Estimated Creatinine Clearance: 82.9 mL/min (by C-G formula based on Cr of 0.42).  Assessment: 59yof on therapeutic lovenox for PE to heparin IV on 1/4. Hg low stable, plt wnl. No bleeding reported. On Xarelto pta. Heparin restarted 1/4 s/p trach.  HL subtherapeutic (0.14) on 1400 units/h.  Goal of Therapy:  Heparin level 0.3-0.7 units/ml Monitor platelets by anticoagulation protocol: Yes   Plan:  Inc heparin to 1650 units/h 6h HL Daily HL/CBC Mon s/sx bleeding  Elicia Lamp, PharmD, [facility] Clinical Pharmacist Pager [phone] 06/28/2015 12:54 PM

## 2015-06-28 NOTE — Progress Notes (Signed)
Spoke with Dr. Chase Caller regarding patient not being able to transfer to [place] on Fentanyl drip.  Said to turn it off and order Fenatnyl pushes prn.  Fentanyl turned off.   Called and updated [facility].  Fentanyl pushes ordered, will wait until 5pm to transfer

## 2015-06-28 NOTE — Progress Notes (Signed)
Wasted 100 mL (1000 mcg) fentanyl in sink. Witnessed by [employer].

## 2015-06-28 NOTE — Discharge Summary (Signed)
Physician Discharge Summary  Patient ID: JASBIR OLESEN MRN: [id number] DOB/AGE: [birth year] 60 y.o.  Admit date: 06/13/2015 Discharge date: 06/19/15  Discharge Diagnoses:  Sepsis    Active Problems:  Stroke (cerebrum) (HCC)   Aphasia   Flaccid hemiplegia and hemiparesis (HCC)   Uncontrolled type 2 diabetes mellitus with hyperosmolarity without coma, without long-term current use of insulin (HCC)   HLD (hyperlipidemia)   Type 2 diabetes mellitus with circulatory disorder (HCC)   Hemiparesis, aphasia, and dysphagia as late effect of cerebrovascular accident (CVA) ([place])   Fibromyalgia   Chronic pain syndrome   Generalized anxiety disorder   Pulmonary embolism without acute cor pulmonale (HCC)   HTN (hypertension)   Tachycardia   Breathing difficulty   Leukocytosis   Discharged Condition: Guarded.   Significant Diagnostic Studies: Dg Chest 2 View  06/16/2015  CLINICAL DATA:  Breathing difficulty. EXAM: CHEST  2 VIEW COMPARISON:  06/05/2015 FINDINGS: Moderate cardiac enlargement. There is no pleural effusion or edema. The lungs appear clear. Spondylosis noted within the thoracic spine. IMPRESSION: 1. Cardiac enlargement. Electronically Signed   By: Kerby Moors M.D.   On: 06/16/2015 17:35   Dg Chest Port 1 View  06/18/2015  CLINICAL DATA:  Acute onset of shortness of breath on exertion. Initial encounter. EXAM: PORTABLE CHEST 1 VIEW COMPARISON:  Chest radiograph performed 06/16/2015 FINDINGS: The lungs are well-aerated. Mild bibasilar atelectasis is noted. There is no evidence of pleural effusion or pneumothorax. The cardiomediastinal silhouette is enlarged. No acute osseous abnormalities are seen. IMPRESSION: Mild bibasilar atelectasis noted.  Cardiomegaly noted. Electronically Signed   By: Garald Balding M.D.   On: 06/18/2015 04:44     Brief HPI:   CARESSA HERZBERG is a 60 y.o. female with history of breast cancer, HTN, CAD, chronic pain, DM type 2 who was admitted on  04/11/15 with difficulty speaking, right facial and right sided due to [facility] infarct with occlusion of L-ICA with hemorrhagic conversion. She had was treated with embolectomy and stent with TICI revascularization but developed dense right hemiparesis with global aphasia, apraxia and dysphagia. She had complicated hospital course with VDRF requiring trach and PEG. She was transferred to [facility] for vent wean on 11/02.  She tolerated decannulation and was started on dyphagia 2 diet with honey liquids. She was showing improvement in activity tolerance and was felt to be ready for intensive therapy program. She was admitted to CIR on 05/16/15.  She has had issues with poor intake with intermittent N/V, nose bleeds as well as episode of coffee ground emesis with concerns of GIB. She was made NPO for 24 hours but work up negative and diet resumed. She developed tachycardia with hypotension due to RLL PE on 12/14 and  was transferred to step down unit. She was found to have focal acute non-occlusive DVT in R-CFV and was cleared to start.  Xarelto for treatment. ENT was consulted for input on stridor and she was found to have bilateral vocal cord paresis per laryngoscopy.  Dr. Simeon Craft felt that this was likely due to sequela from CVA and recommended resuming diet as patient without respiratory distress. She was cleared to return to CIR on 06/13/15 to complete family education due to deconditioning from medical issues.    Hospital Course: GIUSEPPA SWARTWOUT was admitted to rehab 06/13/2015 for inpatient therapies to consist of PT, ST and OT at least three hours five days a week. Past admission physiatrist, therapy team and rehab RN have worked together to provide customized  collaborative inpatient rehab. She was maintained on xarelto and follow up labs showed H/H stable at 11.2. She continued to have stridor with acitivity as well as anxiety and agitation on evaluation. Diabetes was monitored on ac/hs basis and levemir was  adjusted for better control. She was started on water protocol and follow up MBS was done on 12/23 showing slight decline in swallow function felt to be due to deconditioning and therefore she was maintained on dysphagia 2, nectar liquids. On has had intermittent issues with increase in labored breathing and CXR 12/24 and 12/26 showed no acute disease.   She was noted to have increased sedation on 12/26 am that was felt to be due to xanax and ultram the night before. Repeat urine culture was ordered to rule out UTI due to MS changes. She developed hypotension with bradycardia and hypoxia early 12/27 am requiring fluid boluses as well as NRB. She was felt to be septic and was transferred to ICU for intubation and closer monitoring.   Rehab course: During patient's stay in rehab weekly team conferences were held to monitor patient's progress, set goals and discuss barriers to discharge.  Therapy evaluation at admission showed that she continued to have moderately severe global aphasia with verbal output consisting of perseverative, neologistic jargon and mild to moderate dysphagia. She required max assist with basic self care tasks and moderate to max assist with mobility.  She did have improvement in participation and required +2 assist for transfers and positioning    Disposition:  Discharged to ICU   Diet: NPO  Medications at discharge:  Xanax 0.5 mg tid prn anxiety Lipitor 40 mg daily Coreg 25 mg bid. Pepcid 20 mg bid Vital AF 237 cc tid if po intake less than 50% Levemir 9 units SQ in am Xarelto 15 mg bid Thiamine 100 mg daily. Beneprotein 6 grams tid Multivitamin daily Lisinopril 10 mg daily Folic acid 1 mg daily.  K dur 30 meq daily.  Water flushes 200 cc qid Ultram 25 mg every 6 hours prn pain.  Trazodone 25-50 mg HS prn insomnia.  Duonebs every 6 hours prn SOB   Signed: Bary Leriche 06/28/2015, 5:01 PM

## 2015-06-28 NOTE — Progress Notes (Signed)
Received CM referral for LTAC consult.  Met with pt and family to discuss LTAC; pt was at [place] recently, and they would prefer for pt to go back there, if possible.  Referral to [facility], admissions liaison for [facility].  Will follow.    Reinaldo Raddle, RN, BSN  Trauma/Neuro ICU Case Manager [phone]

## 2015-06-28 NOTE — Progress Notes (Signed)
ANTICOAGULATION CONSULT NOTE - Follow Up Consult  Pharmacy Consult for Heparin  Indication: pulmonary embolus  Allergies  Allergen Reactions  . Codeine Nausea Only    Nausea?-- "made her sick"  . Oxycodone Nausea Only    Sick   . Oxycontin [Oxycodone Hcl] Nausea Only    Sick     Patient Measurements: Height: 5\' 5"  (165.1 cm) Weight: 189 lb 2.5 oz (85.8 kg) IBW/kg (Calculated) : 57  Vital Signs: Temp: 98.4 F (36.9 C) (01/05 0000) Temp Source: Oral (01/05 0000) BP: 130/56 mmHg (01/05 0200) Pulse Rate: 64 (01/05 0325)  Labs:  Recent Labs  06/25/15 1125  06/26/15 0400 06/27/15 0557 06/27/15 1325 06/28/15 0310  HGB  --   < > 9.0* 8.7*  --  8.8*  HCT  --   --  28.5* 28.3*  --  28.2*  PLT  --   --  202 215  --  211  APTT 29  --   --   --   --   --   LABPROT 14.4  --   --   --   --   --   INR 1.10  --   --   --   --   --   HEPARINUNFRC  --   --   --  0.26* <0.10* <0.10*  CREATININE  --   --  0.32* <0.30*  --  0.42*  < > = values in this interval not displayed.  Estimated Creatinine Clearance: 82.9 mL/min (by C-G formula based on Cr of 0.42).  Assessment: Undetectable heparin level after re-starting heparin drip s/p trach  Goal of Therapy:  Heparin level 0.3-0.7 units/ml Monitor platelets by anticoagulation protocol: Yes   Plan:  -Increase heparin drip to 1400 units/hr -1200 HL  Kerry [name] 06/28/2015,4:42 AM

## 2015-06-28 NOTE — Progress Notes (Addendum)
PULMONARY / CRITICAL CARE MEDICINE   Name: Kerry [name] MRN: [id number] DOB: Oct 07, 1955    ADMISSION DATE:  06/19/2015  REFERRING MD:  CIR  CHIEF COMPLAINT: Altered mental status  SIGNIFICANT EVENTS: 12/14 CT chest >> RLL PE 12/15 Doppler Lt leg >> DVT Rt calf 12/16 Laryngoscopy >> vocal fold paresis/hypomobility 12/17 CT head >> Lt MCA CVA ................. 12/27 Transfer to ICU. 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 06/26/15 - Alert weaning 06/27/15 - s/p trach 06/27/2015 by DF. Following commands. RN reports prolonged urinary retention   SUBJECTIVE/OVERNIGHT/INTERVAL HX 06/28/15 - on ACT post trach. Feels well. No complaints. Extended family by bedside. Husband Norvella Merkin at bedside; had seen MR in 2012 for ILD   VITAL SIGNS: BP 143/52 mmHg  Pulse 58  Temp(Src) 98.4 F (36.9 C) (Oral)  Resp 14  Ht 5\' 5"  (1.651 m)  Wt 85.8 kg (189 lb 2.5 oz)  BMI 31.48 kg/m2  SpO2 96%  VENTILATOR SETTINGS: Vent Mode:  [-] PRVC FiO2 (%):  [28 %-100 %] 28 % Set Rate:  [14 bmp-20 bmp] 14 bmp Vt Set:  [550 mL] 550 mL PEEP:  [5 cmH20] 5 cmH20 Plateau Pressure:  [14 cmH20-17 cmH20] 15 cmH20  INTAKE / OUTPUT: I/O last 3 completed shifts: In: 2292.3 [I.V.:650.6; NG/GT:1541.7; IV Piggyback:100] Out: -   PHYSICAL EXAMINATION: General: pleasant, follows commands Neuro:  RASS 0. Follows commands. HEENT:  Pupils reactive 3 Cardiovascular:   s1 s2 Regular, no murmur Lungs: ronchi bilateral Abdomen:  Soft, non tender, G tube site clean Musculoskeletal:  No edema Skin:  No rashes  LABS: PULMONARY  Recent Labs Lab 06/23/15 0438  PHART 7.374  PCO2ART 51.9*  PO2ART 90.3  HCO3 29.6*  TCO2 31.2  O2SAT 97.1     CBC  Recent Labs Lab 06/26/15 0400 06/27/15 0557 06/28/15 0310  HGB 9.0* 8.7* 8.8*  HCT 28.5* 28.3* 28.2*  WBC 8.4 7.2 7.9  PLT 202 215 211    COAGULATION  Recent Labs Lab 06/25/15 1125  INR 1.10    CARDIAC  No results for input(s): TROPONINI in the last 168 hours. No results for input(s): PROBNP in the last 168 hours.   CHEMISTRY  Recent Labs Lab 06/22/15 0331 06/23/15 0432 06/24/15 0226 06/25/15 0439 06/26/15 0400 06/27/15 0557 06/28/15 0310  NA 141 141 142 141 141 141 140  K 3.8 3.3* 3.1* 3.6 3.2* 3.2* 3.4*  CL 108 106 103 104 103 105 106  CO2 27 26 29 28 29 29 27   GLUCOSE 167* 217* 172* 160* 126* 171* 171*  BUN 10 10 13 11 8 8 9   CREATININE 0.38* 0.35* 0.40* 0.36* 0.32* <0.30* 0.42*  CALCIUM 8.4* 7.5* 8.3* 8.4* 8.5* 8.6* 8.7*  MG 1.9 1.4* 1.7 1.6*  --  1.6* 1.8  PHOS 1.5* 3.8  --   --   --  3.7 3.9   Estimated Creatinine Clearance: 82.9 mL/min (by C-G formula based on Cr of 0.42).   LIVER  Recent Labs Lab 06/25/15 1125 06/26/15 0400  AST  --  27  ALT  --  32  ALKPHOS  --  48  BILITOT  --  0.5  PROT  --  5.3*  ALBUMIN  --  2.2*  INR 1.10  --  INFECTIOUS No results for input(s): LATICACIDVEN, PROCALCITON in the last 168 hours.   ENDOCRINE CBG (last 3)   Recent Labs  06/27/15 2357 06/28/15 0356 06/28/15 0902  GLUCAP 135* 138* 173*         IMAGING x48h  - image(s) personally visualized  -   highlighted in bold Dg Chest Port 1 View  06/27/2015  CLINICAL DATA:  Acute respiratory failure.  Tracheostomy. EXAM: PORTABLE CHEST 1 VIEW COMPARISON:  06/26/2015 FINDINGS: Endotracheal tube has been rib moved. A tracheostomy tube has been placed and projects over the airway, terminating at the inferior margin of the clavicular heads. The cardiac silhouette remains mildly enlarged. Right basilar airspace opacity has increased. Mild left basilar opacity does not appear significantly changed. No sizable pleural effusion or  pneumothorax is identified. IMPRESSION: Increased right basilar airspace disease. Electronically Signed   By: Logan Bores M.D.   On: 06/27/2015 15:06   Dg Chest Port 1 View  06/26/2015  CLINICAL DATA:  Respiratory failure. Subsequent encounter. History of hypertension and diabetes. EXAM: PORTABLE CHEST 1 VIEW COMPARISON:  06/26/2015 at 6:11 a.m. FINDINGS: Interstitial opacities and patchy lung base airspace opacities have improved consistent with improved pulmonary edema. No new lung opacities. No convincing pleural effusion and no pneumothorax. Endotracheal tube now projects 1.7 cm above the carina. IMPRESSION: Significantly improved lung aeration since the earlier study consistent with improved pulmonary edema. No new abnormalities. Electronically Signed   By: Lajean Manes M.D.   On: 06/26/2015 12:21    DISCUSSION: 60 yo female was admitted to [facility] 04/11/15 with 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  She was sent to [facility] and then CIR.  She had RLL PE on 06/07/15.  She developed altered mental status and acute hypoxic/hypercapnic respiratory failure 06/19/15 and transferred back to ICU.  Was difficult intubation.  Found to have MSSA HCAP and E coli UTI.  Self extubated and reintubated 12/31 due to aspiration.  ASSESSMENT / PLAN:  PULMONARY A: Acute hypoxic/hypercapnic respiratory failure. Probable aspiration event 12/31. RLL PE, Lt leg DVT - mid dec 2016 Vocal cord paresis.   - s/p trach 06/27/2015 - doing well 06/28/15 on ATC. No local bleeding  P:   ATC day Might need PSV at night - RT protocol IV heparin for PE/DVT fom mid-dec 2016  -> change to lovenox 06/29/15 if no trach bleeding   CARDIOVASCULAR A:  Sepsis. Hx of CAD, HTN, HLD. Bradycardia 1/01.   - nil acute P:  KVO IV fluids -> saline lock 06/28/15 Hold coreg - restart baed on clinical indication Continue lipitor  Resume prinivil 1/01, follow upper airway  RENAL A:   Hypokalemia and hypomagnesemia . P:   Replete mag and  K 06/28/15   GASTROINTESTINAL A:   Dysphagia s/p G tube. P:   Tube feeds while  Protonix  For SUP  HEMATOLOGIC   A:   Anemia of critical illness. P:  F/u CBC - PRBC for hgb < 7gm% Contin IV heparin  - per resp section  INFECTIOUS  CULTURES: 12/27 Blood >>neg 12/27 Urine >> E coli 12/27 Sputum >> MSSA 12/31 Sputum >>neg   A:   Sepsis from MSSA HCAP, E coli UTI. Aspiration pneumonitis 12/31. P:     ANTIBIOTICS: 12/27 Vancomycin >> 12/29 12/27 Fortaz >> 12/29 12/29 Ancef >> 12/31 12/31 Vancomycin >>1/2 12/31 Zosyn >>1/2 Ceftriaxone 1/2>>> (needs stop date - beuign done for aspiration)    ENDOCRINE A:   DM. controlled P:   SSI  NEUROLOGIC A:   Acute encephalopathy 2nd to respiratory failure. ?Lt pontine infarct on CT head 12/27 >> no new CVA on MRI brain 12/28. Recent Lt MCA CVA.   -   Currently awake and follows commands.  P Monitor  GLOBAL LTAC consult 06/28/15. Move to SDU 06/28/15. [facility]  assumption of primary service from 06/29/15 - d/w Dr Sherral Hammers. PCCM stays on as consult       Dr. Brand Males, M.D., [facility].C.P Pulmonary and Critical Care Medicine Staff Physician [place] [facility] Pager: [phone], If no answer or between  15:00h - 7:00h: call [phone]  06/28/2015 10:57 AM

## 2015-06-28 NOTE — Progress Notes (Signed)
Nutrition Follow-up  INTERVENTION:   Increase Vital AF 1.2 to new goal of 60 ml/hr D/C Prostat  Provides: 1728 kcal, 108 grams protein, and 1167 ml H2O.  NUTRITION DIAGNOSIS:   Inadequate oral intake related to inability to eat as evidenced by NPO status. Ongoing.   GOAL:   Patient will meet greater than or equal to 90% of their needs Met.   MONITOR:   Vent status, TF tolerance, Weight trends, Labs, I & O's  ASSESSMENT:   60 yo female was admitted to [facility] 04/11/15 with 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 She was sent to [facility] and then CIR. She had RLL PE on 06/07/15. She developed altered mental status and acute hypoxic/hypercapnic respiratory failure 06/19/15 and transferred back to ICU.  Medications reviewed and include: folic acid, thiamine Labs reviewed: potassium 3.4, Magnesium and Phosphorus WNL CBG's: 138-173 Pt discussed during ICU rounds and with RN.  PEG: Vital AF 1.2 @ 50 with 30 ml Prostat daily: 1540 kcal, 105 grams protein  Diet Order:  Diet NPO time specified  Skin:  Wound (see comment) (MASD on buttocks)  Last BM:  1/4  Height:   Ht Readings from Last 1 Encounters:  06/19/15 5' 5"  (1.651 m)   Weight:   Wt Readings from Last 1 Encounters:  06/28/15 189 lb 2.5 oz (85.8 kg)   Ideal Body Weight:  56.8 kg  BMI:  Body mass index is 31.48 kg/(m^2).  Estimated Nutritional Needs:   Kcal:  1700-1900  Protein:  100-115 grams  Fluid:  >/= 1.5 L/day  EDUCATION NEEDS:   No education needs identified at this time  [place], [place], [place] Pager [phone] After Hours Pager

## 2015-06-28 NOTE — Progress Notes (Signed)
ANTICOAGULATION CONSULT NOTE - Follow Up Consult  Pharmacy Consult for Heparin  Indication: pulmonary embolus  Allergies  Allergen Reactions  . Codeine Nausea Only    Nausea?-- "made her sick"  . Oxycodone Nausea Only    Sick   . Oxycontin [Oxycodone Hcl] Nausea Only    Sick     Patient Measurements: Height: 5\' 5"  (165.1 cm) Weight: 186 lb 1.1 oz (84.4 kg) IBW/kg (Calculated) : 57  Heparin dosing weight = 73 kg  HDWt: 73.6kg  Vital Signs: Temp: 98.3 F (36.8 C) (01/05 1741) Temp Source: Axillary (01/05 1741) BP: 102/78 mmHg (01/05 1741) Pulse Rate: 85 (01/05 1741)  Labs:  Recent Labs  06/26/15 0400 06/27/15 0557  06/28/15 0310 06/28/15 1201 06/28/15 1900  HGB 9.0* 8.7*  --  8.8*  --   --   HCT 28.5* 28.3*  --  28.2*  --   --   PLT 202 215  --  211  --   --   HEPARINUNFRC  --  0.26*  < > <0.10* 0.14* <0.10*  CREATININE 0.32* <0.30*  --  0.42*  --   --   < > = values in this interval not displayed.  Estimated Creatinine Clearance: 82.3 mL/min (by C-G formula based on Cr of 0.42).  Assessment: 59yof on therapeutic lovenox for PE to heparin IV on 1/4. Hg low stable, plt wnl. No bleeding reported. On Xarelto pta. Heparin restarted 1/4 s/p trach.  Heparin level undetectable on 1650 unite/hr, no issues with line, no interruption with infusion.   Goal of Therapy:  Heparin level 0.3-0.7 units/ml Monitor platelets by anticoagulation protocol: Yes   Plan:  Inc heparin to 1900 units/hr F/u AM level Daily HL/CBC Mon s/sx bleeding  Maryanna Shape, PharmD, BCPS  Clinical Pharmacist  Pager: [phone]    06/28/2015 7:54 PM

## 2015-06-29 LAB — CBC
HEMATOCRIT: 27.8 % — AB (ref 36.0–46.0)
HEMOGLOBIN: 8.8 g/dL — AB (ref 12.0–15.0)
MCH: 28.1 pg (ref 26.0–34.0)
MCHC: 31.7 g/dL (ref 30.0–36.0)
MCV: 88.8 fL (ref 78.0–100.0)
PLATELETS: 178 10*3/uL (ref 150–400)
RBC: 3.13 MIL/uL — ABNORMAL LOW (ref 3.87–5.11)
RDW: 15.9 % — ABNORMAL HIGH (ref 11.5–15.5)
WBC: 8.9 10*3/uL (ref 4.0–10.5)

## 2015-06-29 LAB — GLUCOSE, CAPILLARY
GLUCOSE-CAPILLARY: 164 mg/dL — AB (ref 65–99)
GLUCOSE-CAPILLARY: 172 mg/dL — AB (ref 65–99)
GLUCOSE-CAPILLARY: 168 mg/dL — AB (ref 65–99)
GLUCOSE-CAPILLARY: 145 mg/dL — AB (ref 65–99)
Glucose-Capillary: 139 mg/dL — ABNORMAL HIGH (ref 65–99)
Glucose-Capillary: 178 mg/dL — ABNORMAL HIGH (ref 65–99)

## 2015-06-29 LAB — BASIC METABOLIC PANEL
ANION GAP: 9 (ref 5–15)
BUN: 8 mg/dL (ref 6–20)
CALCIUM: 8.7 mg/dL — AB (ref 8.9–10.3)
CO2: 25 mmol/L (ref 22–32)
CREATININE: 0.39 mg/dL — AB (ref 0.44–1.00)
Chloride: 108 mmol/L (ref 101–111)
GFR calc Af Amer: >60 mL/min (ref >60)
GLUCOSE: 181 mg/dL — AB (ref 65–99)
Potassium: 4 mmol/L (ref 3.5–5.1)
Sodium: 142 mmol/L (ref 135–145)

## 2015-06-29 LAB — MAGNESIUM: MAGNESIUM: 1.8 mg/dL (ref 1.7–2.4)

## 2015-06-29 LAB — PHOSPHORUS: PHOSPHORUS: 4.8 mg/dL — AB (ref 2.5–4.6)

## 2015-06-29 LAB — HEPARIN LEVEL (UNFRACTIONATED): HEPARIN UNFRACTIONATED: 0.21 [IU]/mL — AB (ref 0.30–0.70)

## 2015-06-29 MED ORDER — ENOXAPARIN SODIUM 100 MG/ML ~~LOC~~ SOLN
85.0000 mg | Freq: Two times a day (BID) | SUBCUTANEOUS | Status: DC
  Administered 2015-06-29 – 2015-07-02 (×7): 85 mg via SUBCUTANEOUS
  Filled 2015-06-29 (×7): qty 1

## 2015-06-29 MED ORDER — HYDRALAZINE HCL 20 MG/ML IJ SOLN
10.0000 mg | Freq: Four times a day (QID) | INTRAMUSCULAR | Status: DC | PRN
  Administered 2015-06-29: 10 mg via INTRAVENOUS
  Filled 2015-06-29: qty 1

## 2015-06-29 MED ORDER — CARVEDILOL 6.25 MG PO TABS
6.2500 mg | ORAL_TABLET | Freq: Two times a day (BID) | ORAL | Status: DC
  Administered 2015-06-30 – 2015-07-02 (×6): 6.25 mg via ORAL
  Filled 2015-06-29 (×6): qty 1

## 2015-06-29 NOTE — Progress Notes (Signed)
ANTICOAGULATION CONSULT NOTE - Follow Up Consult  Pharmacy Consult for Heparin > transition back to Lovenox Indication: pulmonary embolus  Allergies  Allergen Reactions  . Codeine Nausea Only    Nausea?-- "made her sick"  . Oxycodone Nausea Only    Sick   . Oxycontin [Oxycodone Hcl] Nausea Only    Sick     Patient Measurements: Height: 5\' 5"  (165.1 cm) Weight: 184 lb 8.4 oz (83.7 kg) IBW/kg (Calculated) : 57  Heparin dosing weight = 73 kg  HDWt: 73.6kg  Vital Signs: Temp: 97.9 F (36.6 C) (01/06 0729) Temp Source: Oral (01/06 0729) BP: 172/71 mmHg (01/06 0936) Pulse Rate: 82 (01/06 0836)  Labs:  Recent Labs  06/27/15 0557  06/28/15 0310 06/28/15 1201 06/28/15 1900 06/29/15 0705 06/29/15 1005  HGB 8.7*  --  8.8*  --   --  8.8*  --   HCT 28.3*  --  28.2*  --   --  27.8*  --   PLT 215  --  211  --   --  178  --   HEPARINUNFRC 0.26*  < > <0.10* 0.14* <0.10*  --  0.21*  CREATININE <0.30*  --  0.42*  --   --  0.39*  --   < > = values in this interval not displayed.  Estimated Creatinine Clearance: 81.9 mL/min (by C-G formula based on Cr of 0.39).  Assessment: 59yof on therapeutic lovenox for PE to heparin IV on 1/4. Hg low stable, plt wnl. No bleeding reported. On Xarelto pta. Heparin restarted 1/4 s/p trach.  Heparin level still subtherapeutic this AM on heparin at 1900 units/hr.  No bleeding or complications noted, pharmacy asked to transition back to Lovenox treatment dose for PE today.  Goal of Therapy:  Therapeutic anti-Xa level. Monitor platelets by anticoagulation protocol: Yes   Plan:  D/c IV heparin now. Start Lovenox 1 mg/kg sq q 12 hrs 1 hour after heparin is turned off. (85 mg q 12 hrs) CBC q 72 hrs while on Lovenox.  Uvaldo Rising, BCPS  Clinical Pharmacist Pager [phone]  06/29/2015 11:41 AM

## 2015-06-29 NOTE — Progress Notes (Signed)
Notified that pt has been denied for LTAC admission by [facility].  If desired, attending MD can do Peer to Peer review with insurance payor to appeal decision.  Phone # is [phone], extension [phone].  Reference # for case is [id number].    Will notify attending MD of this option.    Reinaldo Raddle, RN, BSN  Trauma/Neuro ICU Case Manager [phone]

## 2015-06-29 NOTE — Progress Notes (Signed)
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 She was sent to [facility] and then CIR. She had RLL PE on 06/07/15. She developed altered mental status and acute hypoxic/hypercapnic respiratory failure 06/19/15 and was transferred back to ICU. Was difficult intubation. Found to have MSSA HCAP and E coli UTI.  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 06/26/15 - Alert weaning 06/27/15 - s/p trach 06/27/2015 by DF  HPI/Subjective: Patient is resting comfortably in bed.  She denies chest pain shortness breath fevers chills nausea or vomiting.  She does not appear to be in any acute distress.  Assessment/Plan:  Acute hypoxic/hypercapnic respiratory failure s/p trach 06/27/2015 - doing well 06/29/15 on ATC - ongoing trach care per [facility] CM  Probable aspiration event 12/31 - Dysphagia s/p G tube Was previously on dysphagia 2 diet with nectar thick liquids - cont tube feeding for now - consider repeat SLP eval soon   Acute encephalopathy  Appears to be improving - follow clinically  MSSA HCAP Has completed a total of 11 days of abx tx - stop abx and follow clinically   E coli UTI As above  RLL PE - R leg DVT - mid dec 2016 lovenox continues   Anemia of  critical illness Hgb stable   Vocal cord paresis  Recent Lt MCA CVA  Hx of CAD  HTN BP uncontrolled - adjust med tx and follow  HLD  Bradycardia Resolved  Hypokalemia Resolved  Hypomagnesemia Resolved  DM CBG reasonably controlled - follow trend    Code Status: FULL Family Communication: no family present at time of exam Disposition Plan: SDU   Consultants: PCCM  Antibiotics: 12/27 Vancomycin > 12/29 12/27 Fortaz > 12/29 12/29 Ancef > 12/31 12/31 Vancomycin >1/2 12/31 Zosyn >1/2 Ceftriaxone 1/2>1/6  DVT prophylaxis: lovenox (full dose)  Objective: Blood pressure 164/63, pulse 80, temperature 98.3 F (36.8 C), temperature source Oral, resp. rate 21, height 5\' 5"  (1.651 m), weight 83.7 kg (184 lb 8.4 oz), SpO2 97 %.  Intake/Output Summary (Last 24 hours) at 06/29/15 1710 Last data filed at 06/29/15 1300  Gross per 24 hour  Intake 1954.85 ml  Output      0 ml  Net 1954.85 ml   Exam: General: No acute respiratory distress at rest on TC  Lungs: Clear to auscultation bilaterally without wheezes or crackles Cardiovascular: Regular rate and rhythm without murmur gallop or rub normal S1 and S2 Abdomen: Nontender, nondistended, soft, bowel sounds positive, no rebound, no ascites, no appreciable mass Extremities: No significant cyanosis, clubbing, or edema bilateral lower extremities  Data Reviewed: Basic Metabolic Panel:  Recent Labs Lab 06/23/15 0432 06/24/15 0226 06/25/15 0439 06/26/15 0400 06/27/15 0557 06/28/15 0310 06/29/15 0705  NA 141 142 141 141 141 140  142  K 3.3* 3.1* 3.6 3.2* 3.2* 3.4* 4.0  CL 106 103 104 103 105 106 108  CO2 26 29 28 29 29 27 25   GLUCOSE 217* 172* 160* 126* 171* 171* 181*  BUN 10 13 11 8 8 9 8   CREATININE 0.35* 0.40* 0.36* 0.32* <0.30* 0.42* 0.39*  CALCIUM 7.5* 8.3* 8.4* 8.5* 8.6* 8.7* 8.7*  MG 1.4* 1.7 1.6*  --  1.6* 1.8 1.8  PHOS 3.8  --   --   --  3.7 3.9 4.8*    CBC:  Recent Labs Lab 06/25/15 0439  06/26/15 0400 06/27/15 0557 06/28/15 0310 06/29/15 0705  WBC 7.2 8.4 7.2 7.9 8.9  NEUTROABS  --  4.8  --   --   --   HGB 8.3* 9.0* 8.7* 8.8* 8.8*  HCT 26.7* 28.5* 28.3* 28.2* 27.8*  MCV 89.6 89.3 89.8 88.7 88.8  PLT 175 202 215 211 178    Liver Function Tests:  Recent Labs Lab 06/26/15 0400  AST 27  ALT 32  ALKPHOS 48  BILITOT 0.5  PROT 5.3*  ALBUMIN 2.2*   Coags:  Recent Labs Lab 06/25/15 1125  INR 1.10    Recent Labs Lab 06/25/15 1125  APTT 29   CBG:  Recent Labs Lab 06/28/15 2022 06/29/15 0016 06/29/15 0305 06/29/15 0817 06/29/15 1148  GLUCAP 147* 178* 139* 164* 172*    Recent Results (from the past 240 hour(s))  Culture, respiratory (NON-Expectorated)     Status: None   Collection Time: 06/23/15 11:25 AM  Result Value Ref Range Status   Specimen Description ENDOTRACHEAL  Final   Special Requests NONE  Final   Gram Stain   Final    RARE WBC PRESENT,BOTH PMN AND MONONUCLEAR NO SQUAMOUS EPITHELIAL CELLS SEEN NO ORGANISMS SEEN Performed at [facility]    Culture   Final    NO GROWTH 2 DAYS Performed at [facility]    Report Status 06/26/2015 FINAL  Final     Studies:   Recent x-ray studies have been reviewed in detail by the Attending Physician  Scheduled Meds:  Scheduled Meds: . antiseptic oral rinse  7 mL Mouth Rinse q12n4p  . atorvastatin  40 mg Per Tube q1800  . cefTRIAXone (ROCEPHIN)  IV  1 g Intravenous Q24H  . chlorhexidine  15 mL Mouth Rinse BID  . enoxaparin (LOVENOX) injection  85 mg Subcutaneous Q12H  . folic acid  1 mg Per Tube Daily  . insulin aspart  0-15 Units Subcutaneous 6 times per day  . lisinopril  10 mg Per Tube Daily  . pantoprazole sodium  40 mg Per Tube Q24H  . thiamine  100 mg Per Tube Daily    Time spent on care of this patient: 35 mins   Pacey Altizer T , MD   [facility] Office  [phone] Pager - Text Page per Shea Evans as per below:  On-Call/Text Page:       [email or web address]      password [email or web address]  If 7PM-7AM, please contact night-coverage [email or web address] Password [email or web address] 06/29/2015, 5:10 PM   LOS: 10 days

## 2015-06-29 NOTE — Progress Notes (Signed)
PULMONARY / CRITICAL CARE MEDICINE   Name: Kerry [name] MRN: [id number] DOB: May 08, 1956    ADMISSION DATE:  06/19/2015  REFERRING MD:  CIR  CHIEF COMPLAINT: Altered mental status  SIGNIFICANT EVENTS: 12/14 CT chest >> RLL PE 12/15 Doppler Lt leg >> DVT Rt calf 12/16 Laryngoscopy >> vocal fold paresis/hypomobility 12/17 CT head >> Lt MCA CVA ................. 12/27 Transfer to ICU. 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 06/26/15 - Alert weaning 06/27/15 - s/p trach 06/27/2015 by DF. Following commands. RN reports prolonged urinary retention   SUBJECTIVE/OVERNIGHT/INTERVAL HX 06/29/15 - on ACT post trach. Feels well. No complaints. Extended family by bedside. Husband Madgeline Dellis at bedside; had seen MR in 2012 for ILD. No concerns, no bleeding.   VITAL SIGNS: BP 172/71 mmHg  Pulse 82  Temp(Src) 97.9 F (36.6 C) (Oral)  Resp 28  Ht 5\' 5"  (1.651 m)  Wt 184 lb 8.4 oz (83.7 kg)  BMI 30.71 kg/m2  SpO2 96%  VENTILATOR SETTINGS: Vent Mode:  [-]  FiO2 (%):  [28 %] 28 %  INTAKE / OUTPUT: I/O last 3 completed shifts: In: 3100.9 [I.V.:890.3; Other:200; NG/GT:1960.7; IV Piggyback:50] Out: -   PHYSICAL EXAMINATION: General: pleasant, follows commands. Right hemipleplegia Neuro:  RASS 0. Follows commands. HEENT:  Pupils reactive 3 Cardiovascular:   s1 s2 Regular, no murmur Lungs: ronchi bilateral Abdomen:  Soft, non tender, G tube site clean Musculoskeletal:  No edema Skin:  No rashes  LABS: PULMONARY  Recent Labs Lab 06/23/15 0438  PHART 7.374  PCO2ART 51.9*  PO2ART 90.3  HCO3 29.6*  TCO2 31.2  O2SAT 97.1    CBC  Recent Labs Lab 06/27/15 0557 06/28/15 0310 06/29/15 0705  HGB 8.7*  8.8* 8.8*  HCT 28.3* 28.2* 27.8*  WBC 7.2 7.9 8.9  PLT 215 211 178    COAGULATION  Recent Labs Lab 06/25/15 1125  INR 1.10    CARDIAC  No results for input(s): TROPONINI in the last 168 hours. No results for input(s): PROBNP in the last 168 hours.   CHEMISTRY  Recent Labs Lab 06/23/15 0432 06/24/15 0226 06/25/15 0439 06/26/15 0400 06/27/15 0557 06/28/15 0310 06/29/15 0705  NA 141 142 141 141 141 140 142  K 3.3* 3.1* 3.6 3.2* 3.2* 3.4* 4.0  CL 106 103 104 103 105 106 108  CO2 26 29 28 29 29 27 25   GLUCOSE 217* 172* 160* 126* 171* 171* 181*  BUN 10 13 11 8 8 9 8   CREATININE 0.35* 0.40* 0.36* 0.32* <0.30* 0.42* 0.39*  CALCIUM 7.5* 8.3* 8.4* 8.5* 8.6* 8.7* 8.7*  MG 1.4* 1.7 1.6*  --  1.6* 1.8 1.8  PHOS 3.8  --   --   --  3.7 3.9 4.8*   Estimated Creatinine Clearance: 81.9 mL/min (by C-G formula based on Cr of 0.39).   LIVER  Recent Labs Lab 06/25/15 1125 06/26/15 0400  AST  --  27  ALT  --  32  ALKPHOS  --  48  BILITOT  --  0.5  PROT  --  5.3*  ALBUMIN  --  2.2*  INR 1.10  --      INFECTIOUS No results for input(s): LATICACIDVEN, PROCALCITON in the last 168 hours.   ENDOCRINE CBG (  last 3)   Recent Labs  06/29/15 0016 06/29/15 0305 06/29/15 0817  GLUCAP 178* 139* 164*         IMAGING x48h  - image(s) personally visualized  -   highlighted in bold Dg Chest Port 1 View  06/27/2015  CLINICAL DATA:  Acute respiratory failure.  Tracheostomy. EXAM: PORTABLE CHEST 1 VIEW COMPARISON:  06/26/2015 FINDINGS: Endotracheal tube has been rib moved. A tracheostomy tube has been placed and projects over the airway, terminating at the inferior margin of the clavicular heads. The cardiac silhouette remains mildly enlarged. Right basilar airspace opacity has increased. Mild left basilar opacity does not appear significantly changed. No sizable pleural effusion or pneumothorax is identified. IMPRESSION: Increased right basilar airspace disease. Electronically  Signed   By: Logan Bores M.D.   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  She was sent to [facility] and then CIR.  She had RLL PE on 06/07/15.  She developed altered mental status and acute hypoxic/hypercapnic respiratory failure 06/19/15 and transferred back to ICU.  Was difficult intubation.  Found to have MSSA HCAP and E coli UTI.  Self extubated and reintubated 12/31 due to aspiration.  ASSESSMENT / PLAN:  PULMONARY A: Acute hypoxic/hypercapnic respiratory failure. Probable aspiration event 12/31. RLL PE, Lt leg DVT - mid dec 2016 Vocal cord paresis.   - s/p trach 06/27/2015 - doing well 06/29/15 on ATC. No local bleeding  P:   ATC day Might need PSV at night - RT protocol IV heparin for PE/DVT fom mid-dec 2016  -> change to lovenox 06/29/15 , pharm to dose   CARDIOVASCULAR A:  Sepsis. Hx of CAD, HTN, HLD. Bradycardia 1/01.   - nil acute P:  KVO IV fluids -> saline lock 06/28/15 Hold coreg - restart based on clinical indication Continue lipitor  Resume prinivil 1/01, follow upper airway  RENAL A:   Hypokalemia and hypomagnesemia(resolved) . P:   Replete mag and K as needed   GASTROINTESTINAL A:   Dysphagia s/p G tube. P:   Tube feeds while  Protonix  For SUP  HEMATOLOGIC   Recent Labs  06/28/15 0310 06/29/15 0705  HGB 8.8* 8.8*    A:   Anemia of critical illness. P:  F/u CBC - PRBC for hgb < 7gm% Contin IV heparin  - per resp section  INFECTIOUS  CULTURES: 12/27 Blood >>neg 12/27 Urine >> E coli 12/27 Sputum >> MSSA 12/31 Sputum >>neg   A:   Sepsis from MSSA HCAP, E coli UTI. Aspiration pneumonitis 12/31. P:     ANTIBIOTICS: 12/27 Vancomycin >> 12/29 12/27 Fortaz >> 12/29 12/29 Ancef >> 12/31 12/31 Vancomycin >>1/2 12/31 Zosyn >>1/2 Ceftriaxone 1/2>>> stop date 1/9    ENDOCRINE A:   DM. controlled P:   SSI  NEUROLOGIC A:   Acute encephalopathy 2nd  to respiratory failure. ?Lt pontine infarct on CT head 12/27 >> no new CVA on MRI brain 12/28. Recent Lt MCA CVA.   -   Currently awake and follows commands.  P Monitor  [facility] consult 06/28/15. Move to SDU 06/28/15. [facility]  assumption of primary service from 06/29/15 - d/w Dr Sherral Hammers. PCCM stays on as consult. We will not see over the weekend. See PRN for trach care.      Richardson Landry Minor ACNP Maryanna Shape PCCM Pager [phone] till 3 pm If no answer page [phone] 06/29/2015, 11:10 AM

## 2015-06-30 ENCOUNTER — Encounter (HOSPITAL_COMMUNITY): Payer: Self-pay

## 2015-06-30 LAB — PHOSPHORUS: Phosphorus: 4.1 mg/dL (ref 2.5–4.6)

## 2015-06-30 LAB — COMPREHENSIVE METABOLIC PANEL
ALT: 33 U/L (ref 14–54)
ANION GAP: 11 (ref 5–15)
AST: 20 U/L (ref 15–41)
Albumin: 2.7 g/dL — ABNORMAL LOW (ref 3.5–5.0)
Alkaline Phosphatase: 51 U/L (ref 38–126)
BUN: 7 mg/dL (ref 6–20)
CHLORIDE: 104 mmol/L (ref 101–111)
CO2: 27 mmol/L (ref 22–32)
Calcium: 9.1 mg/dL (ref 8.9–10.3)
Creatinine, Ser: 0.37 mg/dL — ABNORMAL LOW (ref 0.44–1.00)
GFR calc non Af Amer: >60 mL/min (ref >60)
Glucose, Bld: 164 mg/dL — ABNORMAL HIGH (ref 65–99)
POTASSIUM: 3.4 mmol/L — AB (ref 3.5–5.1)
SODIUM: 142 mmol/L (ref 135–145)
Total Bilirubin: 0.3 mg/dL (ref 0.3–1.2)
Total Protein: 6.1 g/dL — ABNORMAL LOW (ref 6.5–8.1)

## 2015-06-30 LAB — GLUCOSE, CAPILLARY
GLUCOSE-CAPILLARY: 144 mg/dL — AB (ref 65–99)
GLUCOSE-CAPILLARY: 159 mg/dL — AB (ref 65–99)
GLUCOSE-CAPILLARY: 171 mg/dL — AB (ref 65–99)
GLUCOSE-CAPILLARY: 173 mg/dL — AB (ref 65–99)
Glucose-Capillary: 173 mg/dL — ABNORMAL HIGH (ref 65–99)
Glucose-Capillary: 148 mg/dL — ABNORMAL HIGH (ref 65–99)
Glucose-Capillary: 106 mg/dL — ABNORMAL HIGH (ref 65–99)

## 2015-06-30 LAB — CBC
HCT: 30.4 % — ABNORMAL LOW (ref 36.0–46.0)
HEMOGLOBIN: 9.4 g/dL — AB (ref 12.0–15.0)
MCH: 27.6 pg (ref 26.0–34.0)
MCHC: 30.9 g/dL (ref 30.0–36.0)
MCV: 89.1 fL (ref 78.0–100.0)
Platelets: 272 10*3/uL (ref 150–400)
RBC: 3.41 MIL/uL — AB (ref 3.87–5.11)
RDW: 15.7 % — ABNORMAL HIGH (ref 11.5–15.5)
WBC: 8.7 10*3/uL (ref 4.0–10.5)

## 2015-06-30 LAB — MAGNESIUM: Magnesium: 1.7 mg/dL (ref 1.7–2.4)

## 2015-06-30 MED ORDER — HYDRALAZINE HCL 20 MG/ML IJ SOLN
10.0000 mg | Freq: Once | INTRAMUSCULAR | Status: AC
  Administered 2015-06-30: 10 mg via INTRAVENOUS
  Filled 2015-06-30: qty 1

## 2015-06-30 MED ORDER — POTASSIUM CHLORIDE 20 MEQ/15ML (10%) PO SOLN
40.0000 meq | Freq: Once | ORAL | Status: AC
  Administered 2015-06-30: 40 meq
  Filled 2015-06-30: qty 30

## 2015-06-30 MED ORDER — AMLODIPINE 1 MG/ML ORAL SUSPENSION
10.0000 mg | Freq: Every day | ORAL | Status: DC
  Administered 2015-06-30: 10 mg
  Filled 2015-06-30 (×2): qty 10

## 2015-06-30 MED ORDER — ONDANSETRON HCL 4 MG/2ML IJ SOLN
INTRAMUSCULAR | Status: AC
  Filled 2015-06-30: qty 2

## 2015-06-30 MED ORDER — ONDANSETRON HCL 4 MG/2ML IJ SOLN
4.0000 mg | Freq: Four times a day (QID) | INTRAMUSCULAR | Status: DC | PRN
  Administered 2015-06-30: 4 mg via INTRAVENOUS

## 2015-06-30 NOTE — Progress Notes (Signed)
Pt c/o nausea and then suddenly vomited a large amount of tube feeding x 2 paged T Rogue Bussing who ordered Zofran and hold tube feeding x 4 hours, post vomiting residual was less than 10 cc. Zofran given Trach suctioned and returned no signs of aspiration. Zofran given will continue to monitor.

## 2015-06-30 NOTE — Progress Notes (Signed)
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 She was sent to an LTAC and then CIR. She had RLL PE on 06/07/15. She developed altered mental status and acute hypoxic/hypercapnic respiratory failure 06/19/15 and was transferred back to ICU from [place]. Was difficult intubation. Found to have MSSA HCAP and E coli UTI.  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  She denies any complaints.  She appears comfortable.  She denies shortness of breath chest pain or abdominal pain.  Assessment/Plan:  Acute hypoxic/hypercapnic respiratory failure s/p trach 06/27/2015 - doing well 06/29/15 on ATC - ongoing trach care per PCCM  Probable aspiration event 12/31 - Dysphagia s/p G tube Was previously on dysphagia 2 diet with nectar thick liquids - cont tube feeding for now - ask SLP to re-eval   Acute encephalopathy  Appears to have returned to baseline mental status for now - follow   MSSA HCAP Has completed a total of 11 days of abx tx - following w/o abx for now   E coli UTI Has completed and abx course   RLL PE - R leg DVT - mid dec  2016 lovenox continues   Anemia of critical illness Hgb stable/improving    Vocal cord paresis As per PCCM  Recent Lt MCA CVA  Hx of CAD  HTN BP not yet at goal - adjust tx again today and follow   HLD  Bradycardia Resolved  Hypokalemia Resolved  Hypomagnesemia Resolved  DM CBG reasonably controlled - follow trend w/o change today   Code Status: FULL Family Communication: no family present at time of exam Disposition Plan: SDU   Consultants: PCCM  Antibiotics: 12/27 Vancomycin > 12/29 12/27 Fortaz > 12/29 12/29 Ancef > 12/31 12/31 Vancomycin >1/2 12/31 Zosyn >1/2 Ceftriaxone 1/2>1/6  DVT prophylaxis: lovenox (full dose)  Objective: Blood pressure 177/66, pulse 81, temperature 98.3 F (36.8 C), temperature source Oral, resp. rate 29, height 5\' 5"  (1.651 m), weight 81.8 kg (180 lb 5.4 oz), SpO2 99 %.  Intake/Output Summary (Last 24 hours) at 06/30/15 1643 Last data filed at 06/30/15 1500  Gross per 24 hour  Intake   1755 ml  Output      0 ml  Net   1755 ml   Exam: General: No acute respiratory distress on TC  Lungs: Clear to auscultation bilaterally without wheezes w/ exception to mild basilar crackles in bed  Cardiovascular: Regular rate and rhythm without murmur  Abdomen: Nontender, nondistended, soft, bowel sounds positive, no rebound Extremities: No significant cyanosis, clubbing, edema bilateral lower extremities  Data Reviewed: Basic Metabolic Panel:  Recent Labs Lab 06/25/15 0439 06/26/15 0400 06/27/15  [id number] 06/28/15 0310 06/29/15 0705 06/30/15 0340  NA 141 141 141 140 142 142  K 3.6 3.2* 3.2* 3.4* 4.0 3.4*  CL 104 103 105 106 108 104  CO2 28 29 29 27 25 27   GLUCOSE 160* 126* 171* 171* 181* 164*  BUN 11 8 8 9 8 7   CREATININE 0.36* 0.32* <0.30* 0.42* 0.39* 0.37*  CALCIUM 8.4* 8.5* 8.6* 8.7* 8.7* 9.1  MG 1.6*  --  1.6* 1.8 1.8 1.7  PHOS  --   --  3.7 3.9 4.8* 4.1    CBC:  Recent Labs Lab 06/26/15 0400 06/27/15 0557  06/28/15 0310 06/29/15 0705 06/30/15 0340  WBC 8.4 7.2 7.9 8.9 8.7  NEUTROABS 4.8  --   --   --   --   HGB 9.0* 8.7* 8.8* 8.8* 9.4*  HCT 28.5* 28.3* 28.2* 27.8* 30.4*  MCV 89.3 89.8 88.7 88.8 89.1  PLT 202 215 211 178 272    Liver Function Tests:  Recent Labs Lab 06/26/15 0400 06/30/15 0340  AST 27 20  ALT 32 33  ALKPHOS 48 51  BILITOT 0.5 0.3  PROT 5.3* 6.1*  ALBUMIN 2.2* 2.7*   Coags:  Recent Labs Lab 06/25/15 1125  INR 1.10    Recent Labs Lab 06/25/15 1125  APTT 29   CBG:  Recent Labs Lab 06/29/15 2038 06/30/15 0037 06/30/15 0417 06/30/15 0744 06/30/15 1117  GLUCAP 145* 144* 173* 171* 173*    Recent Results (from the past 240 hour(s))  Culture, respiratory (NON-Expectorated)     Status: None   Collection Time: 06/23/15 11:25 AM  Result Value Ref Range Status   Specimen Description ENDOTRACHEAL  Final   Special Requests NONE  Final   Gram Stain   Final    RARE WBC PRESENT,BOTH PMN AND MONONUCLEAR NO SQUAMOUS EPITHELIAL CELLS SEEN NO ORGANISMS SEEN Performed at [facility]    Culture   Final    NO GROWTH 2 DAYS Performed at [facility]    Report Status 06/26/2015 FINAL  Final     Studies:   Recent x-ray studies have been reviewed in detail by the Attending Physician  Scheduled Meds:  Scheduled Meds: . antiseptic oral rinse  7 mL Mouth Rinse q12n4p  . atorvastatin  40 mg Per Tube q1800  . carvedilol  6.25 mg Oral BID WC  . chlorhexidine  15 mL Mouth Rinse BID  . enoxaparin (LOVENOX) injection  85 mg Subcutaneous Q12H  . folic acid  1 mg Per Tube Daily  . insulin aspart  0-15 Units Subcutaneous 6 times per day  . lisinopril  10 mg Per Tube Daily  . pantoprazole sodium  40 mg Per Tube Q24H  . thiamine  100 mg Per Tube Daily    Time spent on care of this patient: 35 mins   MCCLUNG,JEFFREY T , MD   [facility] Office  [phone] Pager - Text Page per Shea Evans as per below:  On-Call/Text Page:       [email or web address]      password [email or web address]  If 7PM-7AM, please contact night-coverage [email or web address] Password [email or web address] 06/30/2015, 4:43 PM   LOS: 11 days

## 2015-07-01 LAB — BASIC METABOLIC PANEL
ANION GAP: 10 (ref 5–15)
BUN: 9 mg/dL (ref 6–20)
CALCIUM: 9.3 mg/dL (ref 8.9–10.3)
CO2: 28 mmol/L (ref 22–32)
CREATININE: 0.39 mg/dL — AB (ref 0.44–1.00)
Chloride: 102 mmol/L (ref 101–111)
Glucose, Bld: 168 mg/dL — ABNORMAL HIGH (ref 65–99)
Potassium: 3.9 mmol/L (ref 3.5–5.1)
SODIUM: 140 mmol/L (ref 135–145)

## 2015-07-01 LAB — GLUCOSE, CAPILLARY
GLUCOSE-CAPILLARY: 153 mg/dL — AB (ref 65–99)
GLUCOSE-CAPILLARY: 170 mg/dL — AB (ref 65–99)
GLUCOSE-CAPILLARY: 138 mg/dL — AB (ref 65–99)
Glucose-Capillary: 167 mg/dL — ABNORMAL HIGH (ref 65–99)

## 2015-07-01 MED ORDER — AMLODIPINE BESYLATE 10 MG PO TABS
10.0000 mg | ORAL_TABLET | Freq: Every day | ORAL | Status: DC
  Administered 2015-07-01 – 2015-07-06 (×6): 10 mg
  Filled 2015-07-01 (×3): qty 1
  Filled 2015-07-01 (×2): qty 2
  Filled 2015-07-01: qty 1

## 2015-07-01 NOTE — Progress Notes (Signed)
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 She was sent to an LTAC and then CIR. She had RLL PE on 06/07/15. She developed altered mental status and acute hypoxic/hypercapnic respiratory failure 06/19/15 and was transferred back to ICU from [place]. Was difficult intubation. Found to have MSSA HCAP and E coli UTI.  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  She appears stable at present, w/ no resp distress.  She indicates that she is not in pain and is not sob.      Assessment/Plan:  Acute hypoxic/hypercapnic respiratory failure s/p trach 06/27/2015 - doing well 06/29/15 on ATC - ongoing trach care per PCCM  Probable aspiration event 12/31 - Dysphagia s/p G tube Was previously on dysphagia 2 diet with nectar thick liquids - cont tube feeding for now as SLP suggests pt still not safe for diet - plan for "instrumental assessment of swallow" per SLP as pt is a silent aspirator   Acute encephalopathy  Appears to have returned to baseline mental status    MSSA HCAP Has completed a  total of 11 days of abx tx - following w/o abx  E coli UTI Has completed abx course   RLL PE - R leg DVT - mid dec 2016 lovenox continues   Anemia of critical illness Hgb stable/improving    Vocal cord paresis As per PCCM  Recent Lt MCA CVA  Hx of CAD  HTN Follow BP w/o change today   HLD  Bradycardia Resolved  Hypokalemia Resolved  Hypomagnesemia Resolved  DM CBG reasonably controlled - follow trend w/o change today   Code Status: FULL Family Communication: no family present at time of exam Disposition Plan: SDU   Consultants: PCCM  Antibiotics: 12/27 Vancomycin > 12/29 12/27 Fortaz > 12/29 12/29 Ancef > 12/31 12/31 Vancomycin >1/2 12/31 Zosyn >1/2 Ceftriaxone 1/2>1/6  DVT prophylaxis: lovenox (full dose)  Objective: Blood pressure 150/68, pulse 77, temperature 98.2 F (36.8 C), temperature source Oral, resp. rate 25, height 5\' 5"  (1.651 m), weight 79.9 kg (176 lb 2.4 oz), SpO2 98 %.  Intake/Output Summary (Last 24 hours) at 07/01/15 1619 Last data filed at 07/01/15 1200  Gross per 24 hour  Intake   1181 ml  Output      0 ml  Net   1181 ml   Exam: General: No acute respiratory distress on TC - alert and interactive  Lungs: Clear to auscultation bilaterally without wheezes  Cardiovascular: Regular rate and rhythm Abdomen: Nontender, nondistended, soft, bowel sounds positive, no rebound Extremities: No significant cyanosis, clubbing, or edema bilateral  lower extremities  Data Reviewed: Basic Metabolic Panel:  Recent Labs Lab 06/25/15 0439  06/27/15 0557 06/28/15 0310 06/29/15 0705 06/30/15 0340 07/01/15 0530  NA 141  < > 141 140 142 142 140  K 3.6  < > 3.2* 3.4* 4.0 3.4* 3.9  CL 104  < > 105 106 108 104 102  CO2 28  < > 29 27 25 27 28   GLUCOSE 160*  < > 171* 171* 181* 164* 168*  BUN 11  < > 8 9 8 7 9   CREATININE 0.36*  < > <0.30* 0.42* 0.39* 0.37* 0.39*  CALCIUM 8.4*  < > 8.6* 8.7* 8.7* 9.1 9.3  MG 1.6*  --  1.6* 1.8 1.8 1.7  --    PHOS  --   --  3.7 3.9 4.8* 4.1  --   < > = values in this interval not displayed.  CBC:  Recent Labs Lab 06/26/15 0400 06/27/15 0557 06/28/15 0310 06/29/15 0705 06/30/15 0340  WBC 8.4 7.2 7.9 8.9 8.7  NEUTROABS 4.8  --   --   --   --   HGB 9.0* 8.7* 8.8* 8.8* 9.4*  HCT 28.5* 28.3* 28.2* 27.8* 30.4*  MCV 89.3 89.8 88.7 88.8 89.1  PLT 202 215 211 178 272    Liver Function Tests:  Recent Labs Lab 06/26/15 0400 06/30/15 0340  AST 27 20  ALT 32 33  ALKPHOS 48 51  BILITOT 0.5 0.3  PROT 5.3* 6.1*  ALBUMIN 2.2* 2.7*   Coags:  Recent Labs Lab 06/25/15 1125  INR 1.10    Recent Labs Lab 06/25/15 1125  APTT 29   CBG:  Recent Labs Lab 06/30/15 1647 06/30/15 2004 06/30/15 2308 07/01/15 0336 07/01/15 0750  GLUCAP 159* 148* 106* 153* 170*    Recent Results (from the past 240 hour(s))  Culture, respiratory (NON-Expectorated)     Status: None   Collection Time: 06/23/15 11:25 AM  Result Value Ref Range Status   Specimen Description ENDOTRACHEAL  Final   Special Requests NONE  Final   Gram Stain   Final    RARE WBC PRESENT,BOTH PMN AND MONONUCLEAR NO SQUAMOUS EPITHELIAL CELLS SEEN NO ORGANISMS SEEN Performed at [facility]    Culture   Final    NO GROWTH 2 DAYS Performed at [facility]    Report Status 06/26/2015 FINAL  Final     Studies:   Recent x-ray studies have been reviewed in detail by the Attending Physician  Scheduled Meds:  Scheduled Meds: . amLODipine  10 mg Per Tube Daily  . antiseptic oral rinse  7 mL Mouth Rinse q12n4p  . atorvastatin  40 mg Per Tube q1800  . carvedilol  6.25 mg Oral BID WC  . chlorhexidine  15 mL Mouth Rinse BID  . enoxaparin (LOVENOX) injection  85 mg Subcutaneous Q12H  . folic acid  1 mg Per Tube Daily  . insulin aspart  0-15 Units Subcutaneous 6 times per day  . lisinopril  10 mg Per Tube Daily  . pantoprazole sodium  40 mg Per Tube Q24H  . thiamine  100 mg Per Tube Daily    Time  spent on care of this patient: 25 mins   [facility] T , MD   [facility] Office  [phone] Pager - Text Page per Shea Evans as per below:  On-Call/Text Page:      [email or web address]      password [email or web address]  If 7PM-7AM, please contact night-coverage [email or web address] Password [facility] 07/01/2015, 4:19  PM   LOS: 12 days

## 2015-07-01 NOTE — Evaluation (Addendum)
Clinical/Bedside Swallow Evaluation Patient Details  Name: ANTONIQUE ARMENDAREZ MRN: [id number] Date of Birth: February 24, 1956  Today's Date: 07/01/2015 Time: SLP Start Time (ACUTE ONLY): 1135 SLP Stop Time (ACUTE ONLY): 1142 SLP Time Calculation (min) (ACUTE ONLY): 7 min  Past Medical History:  Past Medical History  Diagnosis Date  . Hypertension   . Coronary artery disease   . Diabetes mellitus   . Breast cancer ([place]) 03/10/2007    Left breat  . Degenerative disc disease, cervical   . Degenerative disc disease, lumbar   . Fibromyalgia   . Hyperlipidemia   . Carpal tunnel syndrome on right   . PE (pulmonary embolism)   . UTI (lower urinary tract infection)   . GIB (gastrointestinal bleeding)   . Elevated troponin   . HTN (hypertension) 06/13/2015  . 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   Past Surgical History:  Past Surgical History  Procedure Laterality Date  . Cardiac surgery    . Appendectomy    . Breast surgery    . Mastectomy Left 03/2007  . Cardiac catheterization  2005  . Vein surgery Right 2005  . Tubal ligation Right   . Radiology with anesthesia N/A 04/11/2015    Procedure: RADIOLOGY WITH ANESTHESIA;  Surgeon: Luanne Bras, MD;  Location: [place];  Service: Radiology;  Laterality: N/A;  . Cardiac catheterization N/A 04/13/2015    Procedure: Temporary Pacemaker;  Surgeon: Peter M Martinique, MD;  Location: [place] CV LAB;  Service: Cardiovascular;  Laterality: N/A;  . Peg placement N/A 04/18/2015    Procedure: PERCUTANEOUS ENDOSCOPIC GASTROSTOMY (PEG) PLACEMENT;  Surgeon: Judeth Horn, MD;  Location: [place];  Service: General;  Laterality: N/A;  bedside  . Esophagogastroduodenoscopy N/A 04/18/2015    Procedure: ESOPHAGOGASTRODUODENOSCOPY (EGD);  Surgeon: Judeth Horn, MD;  Location: [facility] ENDOSCOPY;  Service: General;  Laterality: N/A;   HPI:  60 yo female admitted to [facility] 04/11/15 with 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 She was sent to an LTAC and then CIR. She had PEG and trach 10/26 (has since been decannulated). She had RLL PE on 06/07/15. She developed altered mental status and acute hypoxic/hypercapnic respiratory failure 06/19/15 and was transferred back to ICU from [place]. CT of the head with ? acute left pontine infarct. Was difficult intubation. s/p tracheostomy 1/4. CCM notes vocal cord paresis/hypomobility per laryngoscopy 12/16. Found to have MSSA HCAP and E coli UTI.   Assessment / Plan / Recommendation Clinical Impression  Bedside swallow evaluation complete with goal to address ability to transit a bolus orally for readiness for instrumental exam as patient has a h/o moderate dysphagia with silent aspiration which has now likely been exacerbated by worsening in medical condition requiring difficulty intubation and eventual repeat tracheostomy. Patient was able to efficiently orally transit ice chip trials and initiate a pharyngeal swallow without overt indication of aspiration. Will require instrumental exam prior to initiation of a po diet given h/o dysphagia with silent aspiration. Ideally, would like to see patient safely utilizing PMV to increase subglottic air pressure and maximize swallowing function prior to initiation of pos.  In light of multiple co-morbidities however,  including aphasia, apraxia, vocal cord dysfunction, and now potentially diameter of new tracheostomy,  all impacting ability to communicate and utilize PMV, recommend proceeding with instrumental assessment of swallow to determine potential to advance diet. Ability to utilize PMV for verbal communication in the future guarded with possibility of improvement once  trach downsized.      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 She was sent to an LTAC and then CIR. She had PEG and trach 10/26 (has since been decannulated). She had RLL PE on 06/07/15. She developed altered mental status and acute hypoxic/hypercapnic respiratory failure 06/19/15 and was transferred back to ICU from [place]. CT of the head with ? acute left pontine infarct. Was difficult intubation. s/p tracheostomy 1/4. CCM notes vocal cord paresis/hypomobility per laryngoscopy 12/16. Found to have MSSA HCAP and E coli UTI. Type of Study: Bedside Swallow Evaluation Previous Swallow Assessment: see HPI Diet Prior to this Study: NPO;PEG tube Temperature Spikes Noted: No Respiratory Status: Trach;Trach Collar Trach Size and Type: #6;Cuff;With PMSV not in place History of Recent Intubation: Yes Length of Intubations (days): 8 days Date extubated: 06/27/15 (tracheostomy) Behavior/Cognition: Alert;Requires cueing (aphasia, apraxia) Oral Cavity Assessment: Within Functional Limits Oral Care Completed by SLP: No Oral Cavity - Dentition: Poor condition;Missing dentition Vision: Functional for self-feeding Self-Feeding Abilities: Able to feed self Patient Positioning: Upright in bed Baseline Vocal Quality: Aphonic Volitional Cough: Cognitively unable to elicit Volitional Swallow: Unable to elicit    Oral/Motor/Sensory Function Overall Oral Motor/Sensory Function: Mild impairment (severe motor planning impairment impacting assessment)   Ice Chips Ice chips: Within functional limits Presentation: Spoon   Thin Liquid Thin Liquid: Not tested    Nectar Thick Nectar Thick Liquid: Not tested   Honey Thick Honey Thick Liquid: Not tested   Puree Puree: Not tested    Solid   GO   Baila Rouse MA, CCC-SLP [phone]  Solid: Not tested       Licet Dunphy Meryl 07/01/2015,12:11 PM

## 2015-07-01 NOTE — Evaluation (Signed)
[facility] Speaking Valve - Evaluation Patient Details  Name: ROREE AKEMON MRN: [id number] Date of Birth: [birth year]  Today's Date: 07/01/2015 Time: 1120-1135 SLP Time Calculation (min) (ACUTE ONLY): 15 min  Past Medical History:  Past Medical History  Diagnosis Date  . Hypertension   . Coronary artery disease   . Diabetes mellitus   . Breast cancer ([place]) 03/10/2007    Left breat  . Degenerative disc disease, cervical   . Degenerative disc disease, lumbar   . Fibromyalgia   . Hyperlipidemia   . Carpal tunnel syndrome on right   . PE (pulmonary embolism)   . UTI (lower urinary tract infection)   . GIB (gastrointestinal bleeding)   . Elevated troponin   . HTN (hypertension) 06/13/2015  . 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   Past Surgical History:  Past Surgical History  Procedure Laterality Date  . Cardiac surgery    . Appendectomy    . Breast surgery    . Mastectomy Left 03/2007  . Cardiac catheterization  2005  . Vein surgery Right 2005  . Tubal ligation Right   . Radiology with anesthesia N/A 04/11/2015    Procedure: RADIOLOGY WITH ANESTHESIA;  Surgeon: Luanne Bras, MD;  Location: [place];  Service: Radiology;  Laterality: N/A;  . Cardiac catheterization N/A 04/13/2015    Procedure: Temporary Pacemaker;  Surgeon: Peter M Martinique, MD;  Location: [place] CV LAB;  Service: Cardiovascular;  Laterality: N/A;  . Peg placement N/A 04/18/2015    Procedure: PERCUTANEOUS ENDOSCOPIC GASTROSTOMY (PEG) PLACEMENT;  Surgeon: Judeth Horn, MD;  Location: [place];  Service: General;  Laterality: N/A;  bedside  . 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 She was sent to an LTAC and then CIR.  She had PEG and trach 10/26 (has since been decannulated). She had RLL PE on 06/07/15. She developed altered mental status and acute hypoxic/hypercapnic respiratory failure 06/19/15 and was transferred back to ICU from [place]. CT of the head with ? acute left pontine infarct. Was difficult intubation. s/p tracheostomy 1/4. CCM notes vocal cord paresis/hypomobility per laryngoscopy 12/16. Found to have MSSA HCAP and E coli UTI.   Assessment / Plan / Recommendation Clinical Impression  PMV evaluation complete. Assessment challenging given aphasia, apraxia, and vocal cord dysfunction all impacting ability to completely assess use of upper airway with one way speaking valve in place. Cuff deflated at baseline. Valve placed by SLP with HR and O2 remaining intact during trials. Patient unable to acheive phonation despite maximum verbal, visual, and tactile cueing from clinician as well as deep suctioning by RN. Difficult to determine at this time impact of aphasia/apraxia as primary origin  vs vocal cord dysfunction vs diameter of new tracheostomy however suspect that all contributing. Note that vocal cord dysfunction with question of narrowing of the upper airway impacted abiliy to tolerate PMV s/p initial tracheostomy. Today, skilled observation revealed increased use of abdominal and accessory muscles following placement for 20-30 seconds with subsequent increase in RR to high 30s, resolving with valve removal, suggestive of decreased upper airway patency leading SLP to believe that vocal cord dysfunction exacerbated by size of tracheostomy tube likely impacting ability to move adequate air through upper airway.  Also note  that patient seen by this SLP prior to admission to CIR (was at that time decannulated) and although verbalizations were minimal (given aphasia) and hoarse/low in vocal intensity, they were audible. Recommend valve use with SLP only at this time.     SLP Assessment  Patient needs continued  Speech [facility]  Patient to use PMSV with SLP only   Follow Up Recommendations  Inpatient Rehab    Frequency and Duration min 3x week  2 weeks    PMSV Trial PMSV was placed for: 20-30 seconds Able to redirect subglottic air through upper airway: No Able to Attain Phonation: No Able to Expectorate Secretions: No Behavior: Alert   Tracheostomy Tube  Additional Tracheostomy Tube Assessment Fenestrated: No Trach Collar Period: 24 hours    Vent Dependency  FiO2 (%): 28 %    Cuff Deflation Trial Tolerated Cuff Deflation: Yes Length of Time for Cuff Deflation Trial: deflated at baseline Behavior: Alert   Gabriel Rainwater [place], CCC-SLP [phone]   Ranulfo Kall Meryl 07/01/2015, 12:06 PM

## 2015-07-02 ENCOUNTER — Inpatient Hospital Stay (HOSPITAL_COMMUNITY): Payer: BLUE CROSS/BLUE SHIELD

## 2015-07-02 DIAGNOSIS — I2699 Other pulmonary embolism without acute cor pulmonale: Secondary | ICD-10-CM

## 2015-07-02 DIAGNOSIS — I82402 Acute embolism and thrombosis of unspecified deep veins of left lower extremity: Secondary | ICD-10-CM

## 2015-07-02 LAB — GLUCOSE, CAPILLARY
GLUCOSE-CAPILLARY: 150 mg/dL — AB (ref 65–99)
GLUCOSE-CAPILLARY: 161 mg/dL — AB (ref 65–99)
GLUCOSE-CAPILLARY: 149 mg/dL — AB (ref 65–99)
GLUCOSE-CAPILLARY: 190 mg/dL — AB (ref 65–99)
GLUCOSE-CAPILLARY: 155 mg/dL — AB (ref 65–99)
Glucose-Capillary: 166 mg/dL — ABNORMAL HIGH (ref 65–99)
Glucose-Capillary: 231 mg/dL — ABNORMAL HIGH (ref 65–99)
Glucose-Capillary: 197 mg/dL — ABNORMAL HIGH (ref 65–99)

## 2015-07-02 MED ORDER — ENOXAPARIN SODIUM 80 MG/0.8ML ~~LOC~~ SOLN
80.0000 mg | Freq: Two times a day (BID) | SUBCUTANEOUS | Status: DC
  Administered 2015-07-02 – 2015-07-03 (×2): 80 mg via SUBCUTANEOUS
  Filled 2015-07-02 (×3): qty 0.8

## 2015-07-02 MED ORDER — INSULIN ASPART 100 UNIT/ML ~~LOC~~ SOLN
0.0000 [IU] | Freq: Three times a day (TID) | SUBCUTANEOUS | Status: DC
  Administered 2015-07-03 (×3): 3 [IU] via SUBCUTANEOUS
  Administered 2015-07-04: 2 [IU] via SUBCUTANEOUS
  Administered 2015-07-04: 3 [IU] via SUBCUTANEOUS
  Administered 2015-07-04: 5 [IU] via SUBCUTANEOUS
  Administered 2015-07-05: 3 [IU] via SUBCUTANEOUS
  Administered 2015-07-05: 2 [IU] via SUBCUTANEOUS
  Administered 2015-07-05: 5 [IU] via SUBCUTANEOUS
  Administered 2015-07-06 (×2): 3 [IU] via SUBCUTANEOUS
  Administered 2015-07-06 – 2015-07-07 (×2): 2 [IU] via SUBCUTANEOUS
  Administered 2015-07-07: 3 [IU] via SUBCUTANEOUS
  Administered 2015-07-07: 2 [IU] via SUBCUTANEOUS
  Administered 2015-07-08: 5 [IU] via SUBCUTANEOUS
  Administered 2015-07-08 – 2015-07-09 (×3): 3 [IU] via SUBCUTANEOUS

## 2015-07-02 MED ORDER — CARVEDILOL 6.25 MG PO TABS
6.2500 mg | ORAL_TABLET | Freq: Two times a day (BID) | ORAL | Status: DC
  Administered 2015-07-03 – 2015-07-06 (×7): 6.25 mg
  Filled 2015-07-02 (×7): qty 1

## 2015-07-02 MED ORDER — RESOURCE THICKENUP CLEAR PO POWD
ORAL | Status: DC | PRN
  Filled 2015-07-02 (×2): qty 125

## 2015-07-02 NOTE — Progress Notes (Signed)
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 She was sent to an LTAC and then CIR. She had RLL PE on 06/07/15. She developed altered mental status and acute hypoxic/hypercapnic respiratory failure 06/19/15 and was transferred back to ICU from [place]. Was difficult intubation. Found to have MSSA HCAP and E coli UTI.  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  She has had challenges using the PMV.  She is in good spirits and denies any pain or shortness of breath.     Assessment/Plan:  Acute hypoxic/hypercapnic respiratory failure s/p trach 06/27/2015 - doing well 06/29/15 on ATC - ongoing trach care per PCCM  Probable aspiration event 12/31 - Dysphagia s/p G tube Was previously on dysphagia 2 diet with nectar thick liquids - per SLP as pt is a silent aspirator - has now been cleared for a D3 diet w/ nectar liquids - stop tube feeds and follow diet intake   Acute encephalopathy  Appears to have returned to baseline mental status    MSSA HCAP Has completed a total of 11 days of abx tx - following w/o abx  E coli UTI Has completed abx  course   RLL PE - R leg DVT - mid dec 2016 lovenox continues for now - if continues to tolerate oral intake can resume Xarelto   Anemia of critical illness Hgb stable/improving    Vocal cord paresis As per PCCM  Recent Lt MCA CVA  Hx of CAD  HTN Follow BP w/o change today   HLD  Bradycardia Resolved  Hypokalemia Resolved  Hypomagnesemia Resolved  DM CBG reasonably controlled - follow trend w/o change today   Code Status: FULL Family Communication: spoke w/ signif other at bedside  Disposition Plan: SDU   Consultants: PCCM  Antibiotics: 12/27 Vancomycin > 12/29 12/27 Fortaz > 12/29 12/29 Ancef > 12/31 12/31 Vancomycin >1/2 12/31 Zosyn >1/2 Ceftriaxone 1/2>1/6  DVT prophylaxis: lovenox (full dose)  Objective: Blood pressure 151/74, pulse 76, temperature 97.6 F (36.4 C), temperature source Oral, resp. rate 34, height 5\' 5"  (1.651 m), weight 79.9 kg (176 lb 2.4 oz), SpO2 100 %.  Intake/Output Summary (Last 24 hours) at 07/02/15 1751 Last data filed at 07/02/15 0300  Gross per 24 hour  Intake    940 ml  Output      0 ml  Net    940 ml   Exam: General: No acute respiratory distress on TC Lungs: Clear to auscultation bilaterally  Cardiovascular: Regular rate and rhythm Abdomen: Nontender, nondistended, soft, bowel sounds positive Extremities: No significant cyanosis, clubbing, edema bilateral lower extremities  Data Reviewed: Basic Metabolic Panel:  Recent Labs Lab 06/27/15 0557 06/28/15 0310 06/29/15 0705 06/30/15 0340 07/01/15 0530  NA 141 140 142 142 140  K 3.2* 3.4* 4.0 3.4* 3.9  CL 105 106 108 104 102  CO2 29 27 25 27 28   GLUCOSE 171* 171* 181* 164* 168*  BUN 8 9 8 7 9   CREATININE <0.30* 0.42* 0.39* 0.37* 0.39*  CALCIUM 8.6* 8.7* 8.7* 9.1 9.3  MG 1.6* 1.8 1.8 1.7  --   PHOS 3.7 3.9 4.8* 4.1  --     CBC:  Recent Labs Lab 06/26/15 0400 06/27/15 0557 06/28/15 0310 06/29/15 0705 06/30/15 0340  WBC 8.4 7.2 7.9 8.9 8.7    NEUTROABS 4.8  --   --   --   --   HGB 9.0* 8.7* 8.8* 8.8* 9.4*  HCT 28.5* 28.3* 28.2* 27.8* 30.4*  MCV 89.3 89.8 88.7 88.8 89.1  PLT 202 215 211 178 272    Liver Function Tests:  Recent Labs Lab 06/26/15 0400 06/30/15 0340  AST 27 20  ALT 32 33  ALKPHOS 48 51  BILITOT 0.5 0.3  PROT 5.3* 6.1*  ALBUMIN 2.2* 2.7*   CBG:  Recent Labs Lab 07/02/15 0421 07/02/15 0835 07/02/15 1244 07/02/15 1522 07/02/15 1704  GLUCAP 161* 149* 231* 197* 190*    Recent Results (from the past 240 hour(s))  Culture, respiratory (NON-Expectorated)     Status: None   Collection Time: 06/23/15 11:25 AM  Result Value Ref Range Status   Specimen Description ENDOTRACHEAL  Final   Special Requests NONE  Final   Gram Stain   Final    RARE WBC PRESENT,BOTH PMN AND MONONUCLEAR NO SQUAMOUS EPITHELIAL CELLS SEEN NO ORGANISMS SEEN Performed at [facility]    Culture   Final    NO GROWTH 2 DAYS Performed at [facility]    Report Status 06/26/2015 FINAL  Final     Studies:   Recent x-ray studies have been reviewed in detail by the Attending Physician  Scheduled Meds:  Scheduled Meds: . amLODipine  10 mg Per Tube Daily  . antiseptic oral rinse  7 mL Mouth Rinse q12n4p  . atorvastatin  40 mg Per Tube q1800  . carvedilol  6.25 mg Oral BID WC  . chlorhexidine  15 mL Mouth Rinse BID  . enoxaparin (LOVENOX) injection  80 mg Subcutaneous Q12H  . folic acid  1 mg Per Tube Daily  . insulin aspart  0-15 Units Subcutaneous 6 times per day  . lisinopril  10 mg Per Tube Daily  . pantoprazole sodium  40 mg Per Tube Q24H  . thiamine  100 mg Per Tube Daily    Time spent on care of this patient: 25 mins   [facility] T , MD   [facility] Office  [phone] Pager - Text Page per Shea Evans as per below:  On-Call/Text Page:      [email or web address]      password [email or web address]  If 7PM-7AM, please contact night-coverage [email or web address] Password [email or web address] 07/02/2015, 5:51 PM   LOS: 13 days

## 2015-07-02 NOTE — Progress Notes (Signed)
Speech Language Pathology Treatment: Nada Boozer Speaking valve  Patient Details Name: Kerry [name] MRN: [id number] DOB: 08-05-1955 Today's Date: 07/02/2015 Time: 1040-1100 SLP Time Calculation (min) (ACUTE ONLY): 20 min  Assessment / Plan / Recommendation Clinical Impression  Pt continues to demonstrate poor tolerance of PMSV before and during MBS. Valve placed with stable vital signs, though during brief placement attempts, RR increased and pt gestured to neck to indicate discomfort. No immediate signs of air trapping observed with PMSV removal, but suspect very limited redirection of air to upper airway based on minimal phonation during reflexive coughing as a delayed response to aspiration during MBS. Pt again could not volitionally phonate or cough despite max cues. Pt is not capable of tolerating PMSV for PO intake, nor is it necessary as there is no significant change in function with or without valve. Recommend pt continue to use PMSV with SLP only. Recommend downsize of trach/cuffless when possible for improved function.    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 She was sent to an LTAC and then CIR. She had PEG and trach 10/26 (has since been decannulated). She had RLL PE on 06/07/15. She developed altered mental status and acute hypoxic/hypercapnic respiratory failure 06/19/15 and was transferred back to ICU from [place]. CT of the head with ? acute left pontine infarct. Was difficult intubation. s/p tracheostomy 1/4. CCM notes vocal cord paresis/hypomobility per laryngoscopy 12/16. Found to have MSSA HCAP and E coli UTI.      SLP Plan  Continue with current plan of care     Recommendations  Compensations: Slow rate;Minimize environmental distractions      Patient may use Passy-Muir Speech Valve: with SLP only PMSV Supervision: Full MD: Please consider changing trach tube to : Smaller size;Cuffless       General  recommendations: Rehab consult Oral Care Recommendations: Oral care BID Follow up Recommendations: Inpatient Rehab Plan: Continue with current plan of care   Katherin Ramey, Katherene Ponto 07/02/2015, 11:33 AM

## 2015-07-02 NOTE — Progress Notes (Signed)
PULMONARY / CRITICAL CARE MEDICINE   Name: Kerry [name] MRN: [id number] DOB: 06/24/1955    ADMISSION DATE:  06/19/2015  REFERRING MD:  CIR  CHIEF COMPLAINT: Altered mental status  SIGNIFICANT EVENTS: 12/14 CT chest >> RLL PE 12/15 Doppler Lt leg >> DVT Rt calf 12/16 Laryngoscopy >> vocal fold paresis/hypomobility 12/17 CT head >> Lt MCA CVA ................. 12/27 Transfer to ICU. 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 06/26/15 - Alert weaning 06/27/15 - s/p trach 06/27/2015 by DF. Following commands. RN reports prolonged urinary retention  CULTURES: 12/27 Blood >>neg 12/27 Urine >> E coli 12/27 Sputum >> MSSA 12/31 Sputum >>neg  ANTIBIOTICS: 12/27 Vancomycin >> 12/29 12/27 Fortaz >> 12/29 12/29 Ancef >> 12/31 12/31 Vancomycin >>1/2 12/31 Zosyn >>1/2 Ceftriaxone 1/2>>> stop date 1/9  SUBJECTIVE/OVERNIGHT/INTERVAL HX:  Patient had no acute events overnight. Denies any dyspnea and no signficant secretions. No fever or chills. Tolerating trach collar.  REVIEW OF SYSTEMS:  Denies any chest pain or pressure. No nausea or emesis.  VITAL SIGNS: BP 148/75 mmHg  Pulse 74  Temp(Src) 97.8 F (36.6 C) (Oral)  Resp 26  Ht 5\' 5"  (1.651 m)  Wt 176 lb 2.4 oz (79.9 kg)  BMI 29.31 kg/m2  SpO2 97%  VENTILATOR SETTINGS: Vent Mode:  [-]  FiO2 (%):  [28 %] 28 %  INTAKE / OUTPUT: I/O last 3 completed shifts: In: 1916 [NG/GT:1916] Out: -   PHYSICAL EXAMINATION: General: Awake. Sitting up in bed. Husband at bedside. Neuro:  No distress. Following commands. Nods appropraitely & mouths answers. HEENT:  MMM. Tracheostomy in place. No oral ulcers. Cardiovascular:   Regular rate. No edema. Normal S1 & S2. Lungs:  CTAB. Normal work of breathing on Trach collar. Skin:  Warm & dry. No rash on exposed skin.  LABS: PULMONARY No results for input(s): PHART, PCO2ART, PO2ART, HCO3, TCO2, O2SAT in the last 168 hours.  Invalid input(s): PCO2, PO2  CBC  Recent Labs Lab 06/28/15 0310 06/29/15 0705 06/30/15 0340  HGB 8.8* 8.8* 9.4*  HCT 28.2* 27.8* 30.4*  WBC 7.9 8.9 8.7  PLT 211 178 272    COAGULATION No results for input(s): INR in the last 168 hours.  CARDIAC  No results for input(s): TROPONINI in the last 168 hours. No results for input(s): PROBNP in the last 168 hours.   CHEMISTRY  Recent Labs Lab 06/27/15 0557 06/28/15 0310 06/29/15 0705 06/30/15 0340 07/01/15 0530  NA 141 140 142 142 140  K 3.2* 3.4* 4.0 3.4* 3.9  CL 105 106 108 104 102  CO2 29 27 25 27 28   GLUCOSE 171* 171* 181* 164* 168*  BUN 8 9 8 7 9   CREATININE <0.30* 0.42* 0.39* 0.37* 0.39*  CALCIUM 8.6* 8.7* 8.7* 9.1 9.3  MG 1.6* 1.8 1.8 1.7  --   PHOS 3.7 3.9 4.8* 4.1  --    Estimated Creatinine Clearance: 80.1 mL/min (by C-G formula based on Cr of 0.39).   LIVER  Recent Labs Lab 06/26/15 0400 06/30/15 0340  AST 27 20  ALT 32 33  ALKPHOS 48 51  BILITOT 0.5 0.3  PROT 5.3* 6.1*  ALBUMIN 2.2* 2.7*     INFECTIOUS No results for input(s): LATICACIDVEN, PROCALCITON in the last 168 hours.  ENDOCRINE CBG (last 3)   Recent Labs  07/01/15 2335 07/02/15 0421 07/02/15 0835  GLUCAP 167* 161* 149*         IMAGING x48h  - image(s) personally visualized  -   highlighted in bold Dg Swallowing Func-speech Pathology  07/02/2015  Objective Swallowing Evaluation:   Patient Details Name: Kerry [name] MRN: [id number] Date of Birth: 05-07-1956 Today's Date: 07/02/2015 Time: SLP Start Time (ACUTE ONLY): 1040-SLP Stop Time (ACUTE ONLY): 1100 SLP Time Calculation (min) (ACUTE ONLY): 20 min Past Medical History: @PMH @ Past Surgical History: Past Surgical History Procedure Laterality Date . Cardiac surgery   .  Appendectomy   . Breast surgery   . Mastectomy Left 03/2007 . Cardiac catheterization  2005 . Vein surgery Right 2005 . Tubal ligation Right  . Radiology with anesthesia N/A 04/11/2015   Procedure: RADIOLOGY WITH ANESTHESIA;  Surgeon: Luanne Bras, MD;  Location: [facility];  Service: Radiology;  Laterality: N/A; . Cardiac catheterization N/A 04/13/2015   Procedure: Temporary Pacemaker;  Surgeon: Peter M Martinique, MD;  Location: [place] [facility];  Service: Cardiovascular;  Laterality: N/A; . Peg placement N/A 04/18/2015   Procedure: PERCUTANEOUS ENDOSCOPIC GASTROSTOMY (PEG) PLACEMENT;  Surgeon: Judeth Horn, MD;  Location: [facility];  Service: General;  Laterality: N/A;  bedside . 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 She was sent to an LTAC and then CIR. She had PEG and trach 10/26 (has since been decannulated). She had RLL PE on 06/07/15. She developed altered mental status and acute hypoxic/hypercapnic respiratory failure 06/19/15 and was transferred back to ICU from [place]. CT of the head with ? acute left pontine infarct. Was difficult intubation. s/p tracheostomy 1/4. CCM notes vocal cord paresis/hypomobility per laryngoscopy 12/16. Found to have MSSA HCAP and E coli UTI. Subjective: pt alert, aphasic, inspiratory stridor Assessment / Plan / Recommendation CHL IP CLINICAL IMPRESSIONS 07/02/2015 Therapy Diagnosis Moderate pharyngeal phase dysphagia Clinical Impression Pt presents with a primary sensory based oropharygneal dysphagia. Pt tested with and without PMSV (see treatment note), no change in function. With liquid boluses pt has an intermittent delay in swallow initiation. With thin liquids this delay occurs with the initial sips resulting in silent aspiration before the swallow;  pt eventually responds with a cough when aspirate fully descends trachea. Pt consistently protected airway consuming nectar, but did have some delay with an independent second swallow to fully clear trace lingual residual. Pts function with nectar was adequate even with large consecutive straw sips. Also trialed a chin tuck with thin liquids with great success, no penetration with consecutive straw sips; can be trialed in therapy. Recommend Dys 3 (mechanical soft) diet with nectar thick liquids will follow for tolerance.  Impact on safety and function Mild aspiration risk   CHL IP TREATMENT RECOMMENDATION 07/02/2015 Treatment Recommendations Therapy as outlined in treatment plan below   Prognosis 07/02/2015 Prognosis for Safe Diet Advancement Good Barriers to Reach Goals Language deficits Barriers/Prognosis Comment -- CHL IP DIET RECOMMENDATION 07/02/2015 SLP Diet Recommendations Dysphagia 3 (Mech soft) solids;Nectar thick liquid Liquid Administration via Cup;Straw Medication Administration Whole meds with puree Compensations Slow rate;Minimize environmental distractions Postural Changes Seated upright at 90 degrees   CHL IP OTHER RECOMMENDATIONS 07/02/2015 Recommended Consults -- Oral Care Recommendations Oral care BID Other Recommendations Order thickener from pharmacy   [facility]  IP FOLLOW UP RECOMMENDATIONS 07/02/2015 Follow up Recommendations Inpatient Rehab   CHL IP FREQUENCY AND DURATION 07/02/2015 Speech Therapy Frequency (ACUTE ONLY) min 2x/week Treatment Duration 2 weeks      CHL IP ORAL PHASE 07/02/2015 Oral Phase WFL Oral - Pudding Teaspoon -- Oral - Pudding Cup -- Oral - Honey Teaspoon -- Oral - Honey Cup NT Oral - Nectar Teaspoon -- Oral - Nectar Cup WFL Oral - Nectar Straw -- Oral - Thin Teaspoon -- Oral - Thin Cup WFL Oral - Thin Straw WFL Oral - Puree WFL Oral - Mech Soft -- Oral - Regular WFL Oral - Multi-Consistency -- Oral - Pill -- Oral Phase - Comment --  CHL IP PHARYNGEAL PHASE 07/02/2015 Pharyngeal Phase Impaired  Pharyngeal- Pudding Teaspoon -- Pharyngeal -- Pharyngeal- Pudding Cup -- Pharyngeal -- Pharyngeal- Honey Teaspoon -- Pharyngeal -- Pharyngeal- Honey Cup NT Pharyngeal -- Pharyngeal- Nectar Teaspoon -- Pharyngeal -- Pharyngeal- Nectar Cup Delayed swallow initiation-pyriform sinuses Pharyngeal Material does not enter airway Pharyngeal- Nectar Straw Delayed swallow initiation-pyriform sinuses Pharyngeal -- Pharyngeal- Thin Teaspoon -- Pharyngeal -- Pharyngeal- Thin Cup Delayed swallow initiation-pyriform sinuses;Penetration/Aspiration before swallow Pharyngeal Material enters airway, passes BELOW cords without attempt by patient to eject out (silent aspiration) Pharyngeal- Thin Straw WFL;Compensatory strategies attempted (with notebox) Pharyngeal Material does not enter airway Pharyngeal- Puree WFL Pharyngeal -- Pharyngeal- Mechanical Soft -- Pharyngeal -- Pharyngeal- Regular WFL Pharyngeal -- Pharyngeal- Multi-consistency -- Pharyngeal -- Pharyngeal- Pill WFL Pharyngeal -- Pharyngeal Comment --  CHL IP CERVICAL ESOPHAGEAL PHASE 07/02/2015 Cervical Esophageal Phase WFL Pudding Teaspoon -- Pudding Cup -- Honey Teaspoon -- Honey Cup -- Nectar Teaspoon -- Nectar Cup -- Nectar Straw -- Thin Teaspoon -- Thin Cup -- Thin Straw -- Puree -- Mechanical Soft -- Regular -- Multi-consistency -- Pill -- Cervical Esophageal Comment -- Herbie Baltimore, MA CCC-SLP [phone] Lynann Beaver 07/02/2015, 11:26 AM               ASSESSMENT / PLAN:  60 yo female was admitted to [facility] 04/11/15 with 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  She was sent to [facility] and then CIR.  She had RLL PE on 06/07/15.  She developed altered mental status and acute hypoxic/hypercapnic respiratory failure 06/19/15 and transferred back to ICU.  Was difficult intubation.  Found to have MSSA HCAP and E coli UTI.  Self extubated and reintubated 12/31 due to aspiration. Now s/p tracheostomy 1/4 by DF. Patient tolerating T collar well. Given repeated intubations &  aspiration with vocal cord paresis she should remain with tracheostomy indefinitely.  1. Acute Hypoxic Respiratory Failure:  Secondary to aspiration from vocal core paresis.  S/P tracheostomy 1/4 by DF. Plan for suture remove and tracheostomy change later this week. 2. Left Leg DVT & Right Lower Lobe PE:  December 2016. Currently on Lovenox of systemic anticoagulation per pharmacy. 3. Sepsis with Aspiration Pneumonia & UTI:  Antibiotics per primary service.  Remainder of care per primary service. We will reassess the patient later this week. Please contact us if her status changes.  Sonia Baller Ashok Cordia, M.D. [facility] Pulmonary & Critical Care Pager:  [phone] After 3pm or if no response, call [phone]  07/02/2015, 12:16 PM

## 2015-07-02 NOTE — Progress Notes (Signed)
ANTICOAGULATION CONSULT NOTE - Follow Up Consult  Pharmacy Consult for Lovenox Indication: pulmonary embolus  Allergies  Allergen Reactions  . Codeine Nausea Only    Nausea?-- "made her sick"  . Oxycodone Nausea Only    Sick   . Oxycontin [Oxycodone Hcl] Nausea Only    Sick     Patient Measurements: Height: 5\' 5"  (165.1 cm) Weight: 176 lb 2.4 oz (79.9 kg) IBW/kg (Calculated) : 57 Heparin Dosing Weight: 80 kg  Vital Signs: Temp: 97.8 F (36.6 C) (01/09 1245) Temp Source: Oral (01/09 1245) BP: 150/75 mmHg (01/09 1245) Pulse Rate: 74 (01/09 1245)  Labs:  Recent Labs  06/30/15 0340 07/01/15 0530  HGB 9.4*  --   HCT 30.4*  --   PLT 272  --   CREATININE 0.37* 0.39*    Estimated Creatinine Clearance: 80.1 mL/min (by C-G formula based on Cr of 0.39).  Assessment:    IV heparin changed to Lovenox 85 mg SQ q12h on 06/29/15.    Weight down to 79.9 kg today.  Last CBC 1/7, Hgb had trended up a bit.  Goal of Therapy:  Anti-Xa level 0.6-1 units/ml 4hrs after LMWH dose given Monitor platelets by anticoagulation protocol: Yes   Plan:   Adjust Lovenox from 85 to 80 mg sq q12hrs for decreased weight. Now on syringe size.  Intermittent CBC while on Lovenox. Will check in am.  Arty Baumgartner, [place] Pager: [phone] 07/02/2015,2:37 PM

## 2015-07-02 NOTE — Progress Notes (Signed)
MBSS complete. Full report located under chart review in imaging section. Chilton Sallade, MA CCC-SLP [phone]  

## 2015-07-03 DIAGNOSIS — N342 Other urethritis: Secondary | ICD-10-CM | POA: Diagnosis present

## 2015-07-03 DIAGNOSIS — J3800 Paralysis of vocal cords and larynx, unspecified: Secondary | ICD-10-CM | POA: Diagnosis present

## 2015-07-03 DIAGNOSIS — G9340 Encephalopathy, unspecified: Secondary | ICD-10-CM | POA: Diagnosis present

## 2015-07-03 DIAGNOSIS — I63411 Cerebral infarction due to embolism of right middle cerebral artery: Secondary | ICD-10-CM | POA: Diagnosis present

## 2015-07-03 DIAGNOSIS — T17998S Other foreign object in respiratory tract, part unspecified causing other injury, sequela: Secondary | ICD-10-CM

## 2015-07-03 DIAGNOSIS — T17908A Unspecified foreign body in respiratory tract, part unspecified causing other injury, initial encounter: Secondary | ICD-10-CM | POA: Diagnosis present

## 2015-07-03 DIAGNOSIS — I10 Essential (primary) hypertension: Secondary | ICD-10-CM

## 2015-07-03 LAB — COMPREHENSIVE METABOLIC PANEL
ALT: 31 U/L (ref 14–54)
AST: 20 U/L (ref 15–41)
Albumin: 2.9 g/dL — ABNORMAL LOW (ref 3.5–5.0)
Alkaline Phosphatase: 46 U/L (ref 38–126)
Anion gap: 11 (ref 5–15)
BUN: 10 mg/dL (ref 6–20)
CHLORIDE: 103 mmol/L (ref 101–111)
CO2: 28 mmol/L (ref 22–32)
CREATININE: 0.47 mg/dL (ref 0.44–1.00)
Calcium: 9.2 mg/dL (ref 8.9–10.3)
GFR calc non Af Amer: >60 mL/min (ref >60)
Glucose, Bld: 136 mg/dL — ABNORMAL HIGH (ref 65–99)
Potassium: 3.5 mmol/L (ref 3.5–5.1)
SODIUM: 142 mmol/L (ref 135–145)
Total Bilirubin: 0.5 mg/dL (ref 0.3–1.2)
Total Protein: 6.4 g/dL — ABNORMAL LOW (ref 6.5–8.1)

## 2015-07-03 LAB — CBC
HCT: 34.3 % — ABNORMAL LOW (ref 36.0–46.0)
Hemoglobin: 10.8 g/dL — ABNORMAL LOW (ref 12.0–15.0)
MCH: 28.6 pg (ref 26.0–34.0)
MCHC: 31.5 g/dL (ref 30.0–36.0)
MCV: 90.7 fL (ref 78.0–100.0)
PLATELETS: 265 10*3/uL (ref 150–400)
RBC: 3.78 MIL/uL — AB (ref 3.87–5.11)
RDW: 15.9 % — ABNORMAL HIGH (ref 11.5–15.5)
WBC: 8.8 10*3/uL (ref 4.0–10.5)

## 2015-07-03 LAB — GLUCOSE, CAPILLARY
GLUCOSE-CAPILLARY: 168 mg/dL — AB (ref 65–99)
GLUCOSE-CAPILLARY: 168 mg/dL — AB (ref 65–99)
GLUCOSE-CAPILLARY: 149 mg/dL — AB (ref 65–99)
Glucose-Capillary: 157 mg/dL — ABNORMAL HIGH (ref 65–99)

## 2015-07-03 MED ORDER — RIVAROXABAN 20 MG PO TABS
20.0000 mg | ORAL_TABLET | Freq: Every day | ORAL | Status: DC
  Administered 2015-07-03 – 2015-07-08 (×7): 20 mg via ORAL
  Filled 2015-07-03 (×6): qty 1

## 2015-07-03 NOTE — Progress Notes (Signed)
ANTICOAGULATION CONSULT NOTE - Follow Up Consult  Pharmacy Consult for Xarelto Indication: pulmonary embolus  Allergies  Allergen Reactions  . Codeine Nausea Only    Nausea?-- "made her sick"  . Oxycodone Nausea Only    Sick   . Oxycontin [Oxycodone Hcl] Nausea Only    Sick     Patient Measurements: Height: 5\' 5"  (165.1 cm) Weight: 176 lb 12.9 oz (80.2 kg) IBW/kg (Calculated) : 57  Vital Signs: Temp: 98.5 F (36.9 C) (01/10 1948) Temp Source: Oral (01/10 1948) BP: 130/65 mmHg (01/10 1953) Pulse Rate: 72 (01/10 1953)  Labs:  Recent Labs  07/01/15 0530 07/03/15 0525  HGB  --  10.8*  HCT  --  34.3*  PLT  --  265  CREATININE 0.39* 0.47    Estimated Creatinine Clearance: 80.2 mL/min (by C-G formula based on Cr of 0.47).  Assessment: IV heparin was changed to Lovenox 85 mg SQ q12h on 06/29/15. Now passed swallow eval and switching back to Xarelto for VTE treatment. Last dose of Lovenox was today at 1024. CrCl ~ 80 mL/min   Goal of Therapy:  VTE treatment    Plan:  -Resume home dose of Xarelto 20 mg daily to begin 12 hours from previous Lovenox dose    Albertina Parr, PharmD., BCPS Clinical Pharmacist Pager [phone]

## 2015-07-03 NOTE — Progress Notes (Signed)
Pt is stable at this time sleeping comfortably

## 2015-07-03 NOTE — Progress Notes (Signed)
Speech Language Pathology Treatment: Dysphagia  Patient Details Name: Kerry [name] MRN: [id number] DOB: Jul 27, 1955 Today's Date: 07/03/2015 Time: 1110-1120 SLP Time Calculation (min) (ACUTE ONLY): 10 min  Assessment / Plan / Recommendation Clinical Impression  Pt demonstrates excellent tolerance of nectar thick liquids. Independent intake is appropriate, well paced with no cues needed. No delayed coughing observed over 4 oz of intake. Mastication of mechanical soft solid also adequate. Continue current diet. PMSV trials continue to be unsuccessful with no redirection of air to upper airway even with reflexive cough when inner cannula adjusted. Pt clearly senses resistance and gestures for removal of valve with immediate relief. Pt will need trach downsize to cuffless in order to tolerate PMSV.    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 She was sent to an LTAC and then CIR. She had PEG and trach 10/26 (has since been decannulated). She had RLL PE on 06/07/15. She developed altered mental status and acute hypoxic/hypercapnic respiratory failure 06/19/15 and was transferred back to ICU from [place]. CT of the head with ? acute left pontine infarct. Was difficult intubation. s/p tracheostomy 1/4. CCM notes vocal cord paresis/hypomobility per laryngoscopy 12/16. Found to have MSSA HCAP and E coli UTI.      SLP Plan  Continue with current plan of care     Recommendations  Diet recommendations: Dysphagia 3 (mechanical soft);Nectar-thick liquid Liquids provided via: Cup;Straw Medication Administration: Crushed with puree Supervision: Patient able to self feed;Full supervision/cueing for compensatory strategies Compensations: Slow rate;Minimize environmental distractions Postural Changes and/or Swallow Maneuvers: Seated upright 90 degrees      Patient may use Passy-Muir Speech Valve: with SLP only PMSV Supervision: Full MD: Please consider  changing trach tube to : Smaller size;Cuffless       Oral Care Recommendations: Oral care BID Follow up Recommendations: Inpatient Rehab Plan: Continue with current plan of care  [facility], MA CCC-SLP [phone]  Kerry [name] 07/03/2015, 11:28 AM

## 2015-07-03 NOTE — Progress Notes (Signed)
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 She was sent to an LTAC and then CIR. She had RLL PE on 06/07/15. She developed altered mental status and acute hypoxic/hypercapnic respiratory failure 06/19/15 and was transferred back to ICU from [place]. Was difficult intubation. Found to have MSSA HCAP and E coli UTI.   HPI/Subjective: Acute hypoxic/hypercapnic respiratory failure -s/p trach 06/27/2015 - doing well 06/29/15 on ATC - ongoing trach care per PCCM -Patient either cannot or will not speak when Passy-Muir valve in place  Probable aspiration event 12/31 - Dysphagia s/p G tube -Dysphagia 3 diet nectar thick liquid -PT/OT consult pending, return to CIR vs SNF  Acute encephalopathy  -Appears to have returned to baseline mental status (patient will nod yes and no to questions and mouth answers)   MSSA HCAP -Has completed a total of 11 days of abx tx - following w/o abx  E coli UTI -Has completed abx course   RLL PE - R leg DVT - mid dec 2016 -lovenox continues for now  - Restart Xarelto    Anemia of critical illness -Hgb stable/improving   Vocal cord paresis -As per PCCM  Recent Lt MCA CVA  Hx of CAD  HTN Follow BP w/o change today   HLD  Bradycardia Resolved  Hypokalemia Resolved  Hypomagnesemia Resolved  DM CBG reasonably controlled - follow trend w/o change today    Assessment/Plan: 2/14 CT chest > RLL PE 12/15 Doppler > DVT Rt common femoral vein  12/16 Laryngoscopy > vocal fold paresis/hypomobility 12/17 CT head > Lt MCA CVA 12/20 D/C from hospital - Admit to [place]  12/27 Readmit to hospital from [place] - transfer to  ICU - intubated 12/27 CT head > ?acute Lt pontine infarct 12/27 EEG > diffuse slowing with focus over Lt posterior temporal region 12/28 MRI brain > no evidence of ischemia in pons > 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 12/27 Transfer to ICU. 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 06/26/15 - Alert weaning 06/27/15 - s/p trach 06/27/2015 by DF. Following commands. RN reports prolonged urinary retention   Culture 12/27 Blood >>neg 12/27 Urine >> E coli 12/27 Sputum >> MSSA 12/31 Sputum >>neg   Antibiotics: 12/27 Vancomycin >> 12/29 12/27 Fortaz >> 12/29 12/29 Ancef >> 12/31 12/31 Vancomycin >>1/2 12/31 Zosyn >>1/2 Ceftriaxone 1/2>>> stop date 1/9   DVT prophylaxis: Lovenox--> Xarelto   Devices NA   LINES / TUBES:      Continuous  Infusions:   Objective: VITAL SIGNS: Temp: 98.5 F (36.9 C) (01/10 1948) Temp Source: Oral (01/10 1948) BP: 130/65 mmHg (01/10 1953) Pulse Rate: 72 (01/10 1953) SPO2; FIO2:   Intake/Output Summary (Last 24 hours) at 07/03/15 2231 Last data filed at 07/03/15 1811  Gross per 24 hour  Intake    240 ml  Output      0 ml  Net    240 ml     Exam: General: A/O  4, unable to speak but will nod yes and no to questions, No acute respiratory distress (with tracheostomy) Eyes: Negative headache, eye pain, double vision,negative scleral hemorrhage ENT: Negative Runny nose, negative ear pain, negative gingival bleeding, Neck:  Negative scars, masses, torticollis, lymphadenopathy, JVD Lungs: Clear to auscultation bilaterally without wheezes or crackles Cardiovascular: Regular rate and rhythm without murmur gallop or rub normal S1 and S2 Abdomen:negative abdominal pain, nondistended, positive soft, bowel sounds, no rebound, no ascites, no appreciable mass Extremities: No significant cyanosis, clubbing, or edema bilateral lower extremities Psychiatric:  Negative depression, negative anxiety, negative fatigue, negative mania  Neurologic:  Cranial nerves II through XII intact, tongue/uvula midline, all extremities muscle strength 5/5, sensation intact throughout, negative dysarthria, negative expressive aphasia, negative receptive aphasia.   Data Reviewed: Basic Metabolic Panel:  Recent Labs Lab 06/27/15 0557 06/28/15 0310 06/29/15 0705 06/30/15 0340 07/01/15 0530 07/03/15 0525  NA 141 140 142 142 140 142  K 3.2* 3.4* 4.0 3.4* 3.9 3.5  CL 105 106 108 104 102 103  CO2 29 27 25 27 28 28   GLUCOSE 171* 171* 181* 164* 168* 136*  BUN 8 9 8 7 9 10   CREATININE <0.30* 0.42* 0.39* 0.37* 0.39* 0.47  CALCIUM 8.6* 8.7* 8.7* 9.1 9.3 9.2  MG 1.6* 1.8 1.8 1.7  --   --   PHOS 3.7 3.9 4.8* 4.1  --   --    Liver Function Tests:  Recent Labs Lab 06/30/15 0340 07/03/15 0525  AST 20 20  ALT 33 31  ALKPHOS 51 46  BILITOT 0.3 0.5  PROT 6.1* 6.4*  ALBUMIN 2.7* 2.9*   No results for input(s): LIPASE, AMYLASE in the last 168 hours. No results for input(s): AMMONIA in the last 168 hours. CBC:  Recent Labs Lab 06/27/15 0557 06/28/15 0310 06/29/15 0705 06/30/15 0340 07/03/15 0525  WBC 7.2 7.9 8.9 8.7 8.8  HGB 8.7* 8.8* 8.8* 9.4* 10.8*  HCT 28.3* 28.2*  27.8* 30.4* 34.3*  MCV 89.8 88.7 88.8 89.1 90.7  PLT 215 211 178 272 265   Cardiac Enzymes: No results for input(s): CKTOTAL, CKMB, CKMBINDEX, TROPONINI in the last 168 hours. BNP (last 3 results) No results for input(s): BNP in the last 8760 hours.  ProBNP (last 3 results) No results for input(s): PROBNP in the last 8760 hours.  CBG:  Recent Labs Lab 07/02/15 2128 07/03/15 0844 07/03/15 1108 07/03/15 1556 07/03/15 2115  GLUCAP 155* 157* 168* 168* 149*    No results found for this or any previous visit (from the past 240 hour(s)).   Studies:  Recent x-ray studies have been reviewed in detail by the Attending Physician  Scheduled Meds:  Scheduled Meds: . amLODipine  10 mg Per Tube Daily  . antiseptic oral rinse  7 mL Mouth Rinse q12n4p  . atorvastatin  40 mg Per Tube q1800  . carvedilol  6.25 mg Per Tube BID WC  . chlorhexidine  15 mL Mouth Rinse BID  . folic acid  1 mg Per Tube Daily  .  insulin aspart  0-15 Units Subcutaneous TID WC  . lisinopril  10 mg Per Tube Daily  . pantoprazole sodium  40 mg Per Tube Q24H  . rivaroxaban  20 mg Oral Q supper  . thiamine  100 mg Per Tube Daily    Time spent on care of this patient: 40 mins   WOODS, Geraldo Docker , MD  [facility] Office  [phone] Pager - [phone]  On-Call/Text Page:      [email or web address]      password [email or web address]  If 7PM-7AM, please contact night-coverage [email or web address] Password [email or web address] 07/03/2015, 10:31 PM   LOS: 14 days   Care during the described time interval was provided by me .  I have reviewed this patient's available data, including medical history, events of note, physical examination, and all test results as part of my evaluation. I have personally reviewed and interpreted all radiology studies.   Dia Crawford, MD [phone] Pager

## 2015-07-04 DIAGNOSIS — T17998D Other foreign object in respiratory tract, part unspecified causing other injury, subsequent encounter: Secondary | ICD-10-CM

## 2015-07-04 DIAGNOSIS — L8990 Pressure ulcer of unspecified site, unspecified stage: Secondary | ICD-10-CM | POA: Insufficient documentation

## 2015-07-04 LAB — GLUCOSE, CAPILLARY
GLUCOSE-CAPILLARY: 211 mg/dL — AB (ref 65–99)
GLUCOSE-CAPILLARY: 138 mg/dL — AB (ref 65–99)
GLUCOSE-CAPILLARY: 195 mg/dL — AB (ref 65–99)
Glucose-Capillary: 163 mg/dL — ABNORMAL HIGH (ref 65–99)

## 2015-07-04 MED ORDER — POTASSIUM CHLORIDE 20 MEQ/15ML (10%) PO SOLN
40.0000 meq | Freq: Once | ORAL | Status: AC
  Administered 2015-07-04: 40 meq via ORAL
  Filled 2015-07-04: qty 30

## 2015-07-04 MED ORDER — ENSURE ENLIVE PO LIQD
237.0000 mL | ORAL | Status: DC
  Administered 2015-07-04 – 2015-07-08 (×4): 237 mL via ORAL

## 2015-07-04 MED ORDER — ACETAMINOPHEN 325 MG PO TABS
650.0000 mg | ORAL_TABLET | Freq: Four times a day (QID) | ORAL | Status: DC | PRN
  Administered 2015-07-07 – 2015-07-09 (×2): 650 mg via ORAL
  Filled 2015-07-04 (×2): qty 2

## 2015-07-04 NOTE — Evaluation (Signed)
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 She was sent to an LTAC and then CIR. She had RLL PE on 06/07/15. She developed altered mental status and acute hypoxic/hypercapnic respiratory failure 06/19/15 and was transferred back to ICU from [place]. Was difficult intubation. Found to have MSSA HCAP and E coli UTI.  Pt's additional PMH includes DM, Lt breast cancer s/p mastectomy, DDD of cervical and lumbar spine, fibromyalgia, carpal tunnel Rt UE.  Clinical Impression  Pt admitted with above diagnosis. Pt currently with functional limitations due to the deficits listed below (see PT Problem List). [name] previously required assist for transfer to [facility] while in [place].  She currently requires +2 max assist for safe transfer to chair.  Pt sat EOB for ~10 mins w/ min guard>max assist.  Pt will benefit from skilled PT to increase their independence and safety with mobility to allow discharge to the venue listed below.      Follow Up Recommendations CIR;Supervision/Assistance - 24 hour    Equipment Recommendations  Wheelchair (measurements PT);Wheelchair cushion (measurements PT) (hoyer lift; (all TBD))    Recommendations for Other Services Rehab consult;OT consult     Precautions / Restrictions Precautions Precautions: Fall;Other (comment) Precaution Comments: PEG, Rt shoulder subluxation Restrictions Weight Bearing Restrictions: No      Mobility  Bed Mobility Overal bed mobility: +2 for physical assistance;Needs Assistance Bed Mobility: Rolling;Sidelying to Sit Rolling: Max assist Sidelying to sit: +2 for physical assistance;Mod assist       General bed mobility comments: Use of bed pad to assist w/ rolling and positioning of pelvis once sitting.  Assist to boost up to  sitting from sidelying, pt w/ good initiation.  Transfers Overall transfer level: Needs assistance Equipment used: 2 person hand held assist Transfers: Sit to/from [facility] Sit to Stand: Max assist;+2 physical assistance;From elevated surface Stand pivot transfers: +2 physical assistance;Max assist       General transfer comment: Assist to boost up to standing using bed pad w/ Rt knee blocked.  No functional movement of Rt LE noted during pivot and assist to control descent to chair.  Ambulation/Gait                Stairs            Wheelchair Mobility    Modified Rankin (Stroke Patients Only)       Balance Overall balance assessment: Needs assistance Sitting-balance support: Feet supported;Bilateral upper extremity supported Sitting balance-Leahy Scale: Poor Sitting balance - Comments: Min guard>max assist sitting EOB.  Pt able to sit EOB w/ Lt UE support for ~10 minutes, ocassionally requiring support to posterior trunk Postural control: Posterior lean;Left lateral lean Standing balance support: Bilateral upper extremity supported;During functional activity Standing balance-Leahy Scale: Zero                               Pertinent Vitals/Pain Pain Assessment: Faces Faces Pain Scale: No hurt Pain Intervention(s): Monitored during session    Home Living Family/patient expects to be discharged to:: Inpatient rehab Living Arrangements: Spouse/significant other;Children;Non-relatives/Friends Available Help at Discharge: Family;Available 24 hours/day Type of Home: House Home Access: Stairs to enter Entrance Stairs-Rails: Right;Left;Can reach both Entrance Stairs-Number of Steps: 3 Home Layout: Two  level;Able to live on main level with bedroom/bathroom   Additional Comments: All information taken from previous notes, pt unable to confirm any PLOF or home layout    Prior Function Level of Independence: Needs assistance   Gait  / Transfers Assistance Needed: Per chart review bed>WC w/ assist in CIR  ADL's / Homemaking Assistance Needed: Needs assist w/ bathing, dressing.  Able to feed herself        Hand Dominance   Dominant Hand: Right    Extremity/Trunk Assessment   Upper Extremity Assessment: RUE deficits/detail RUE Deficits / Details: flaccid with subluxation         Lower Extremity Assessment: RLE deficits/detail;LLE deficits/detail RLE Deficits / Details: flaccid, no movement noted LLE Deficits / Details: strength grossly 3-/5  Cervical / Trunk Assessment: Kyphotic  Communication   Communication: Expressive difficulties;Tracheostomy;Passy-Muir valve (Currently Passy-Muir valve to only be used w/ Speech Therapy)  Cognition Arousal/Alertness: Awake/alert Behavior During Therapy: WFL for tasks assessed/performed Overall Cognitive Status: Difficult to assess                 General Comments: Pt initially mouthing responses to questions which this PT has difficulty understanding.  When asked yes/no questions pt has inconsistently appropriate response.  When asked if her name was Kerry [name] she neither nodded nor shook her head and could not do so even after directions provided.    General Comments      Exercises General Exercises - Lower Extremity Ankle Circles/Pumps: PROM;Both;10 reps;Supine Short Arc Quad: PROM;AROM;Left;Right;10 reps;Supine Heel Slides: PROM;Right;10 reps;Seated      Assessment/Plan    PT Assessment Patient needs continued PT services  PT Diagnosis Hemiplegia dominant side;Altered mental status;Difficulty walking   PT Problem List Decreased strength;Decreased range of motion;Decreased activity tolerance;Decreased balance;Decreased mobility;Decreased coordination;Decreased cognition;Decreased knowledge of use of DME;Decreased safety awareness;Decreased knowledge of precautions;Cardiopulmonary status limiting activity  PT Treatment Interventions DME instruction;Gait  training;Functional mobility training;Therapeutic activities;Therapeutic exercise;Balance training;Neuromuscular re-education;Cognitive remediation;Patient/family education;Wheelchair mobility training   PT Goals (Current goals can be found in the Care Plan section) Acute Rehab PT Goals Patient Stated Goal: pt unable to state PT Goal Formulation: Patient unable to participate in goal setting Time For Goal Achievement: 07/25/15 Potential to Achieve Goals: Fair    Frequency Min 3X/week   Barriers to discharge        Co-evaluation               End of Session Equipment Utilized During Treatment: Gait belt;Oxygen Activity Tolerance: Patient limited by fatigue Patient left: in chair;with call bell/phone within reach;with chair alarm set (w/ lift pad in chair) Nurse Communication: Mobility status;Need for lift equipment;Precautions (needs order for Rt PRAFO boot)         Time: [id number] PT Time Calculation (min) (ACUTE ONLY): 27 min   Charges:   PT Evaluation $PT Eval High Complexity: 1 Procedure PT Treatments $Therapeutic Activity: 8-22 mins   PT G Codes:       Joslyn Hy PT, DPT [phone] Pager: [phone] 07/04/2015, 2:54 PM

## 2015-07-04 NOTE — Progress Notes (Signed)
I know pt and family well from previous recent admissions to inpt rehab. Original admit to [facility] 04/11/15 with subsequent d/c to [place] 04/27/2015. Admitted to CIR 05/16/15-06/07/15 when readmitted to [facility] due to medical issues.Readmitted to CIR 06/13/15-06/19/15 when once again readmitted to [facility] acute with medical issues. I spoke with pt's spouse 07/02/15 and he stated pt would not be readmitted to CIR. I contacted pt's son, [name], by phone today to clarify pt/family wishes for rehab venue. [name] will contact me in next 24 hrs after discussing with pt and family. I will discuss with rehab team and let [facility]  attending team know if CIR is an option for this pt. [phone]

## 2015-07-04 NOTE — Progress Notes (Signed)
ANTICOAGULATION CONSULT NOTE - Follow Up Consult  Pharmacy Consult for Xarelto Indication: pulmonary embolus  Allergies  Allergen Reactions  . Codeine Nausea Only    Nausea?-- "made her sick"  . Oxycodone Nausea Only    Sick   . Oxycontin [Oxycodone Hcl] Nausea Only    Sick     Patient Measurements: Height: 5\' 5"  (165.1 cm) Weight: 171 lb 15.3 oz (78 kg) IBW/kg (Calculated) : 57  Vital Signs: Temp: 98.5 F (36.9 C) (01/11 1156) Temp Source: Oral (01/11 1156) BP: 143/73 mmHg (01/11 1207) Pulse Rate: 76 (01/11 1207)  Labs:  Recent Labs  07/03/15 0525  HGB 10.8*  HCT 34.3*  PLT 265  CREATININE 0.47    Estimated Creatinine Clearance: 79.1 mL/min (by C-G formula based on Cr of 0.47).  Assessment: Patient on xarelto 20 mg at home for prior VTE  Renal fx stable  Goal of Therapy:  VTE treatment    Plan:  -Continue home xarelto 20 mg with supper -Monitor for s/sx of bleeding  Pharmacy to sign-off but are available as needed   Levester Fresh, PharmD, BCPS, [facility] Clinical Pharmacist Pager [phone] 07/04/2015 1:17 PM

## 2015-07-04 NOTE — Progress Notes (Signed)
Nutrition Follow-up  DOCUMENTATION CODES:   Not applicable  INTERVENTION:    Ensure Enlive PO daily, thickened to appropriate consistency, each supplement provides 350 kcal and 20 grams of protein  Pudding TID between meals  NUTRITION DIAGNOSIS:   Inadequate oral intake related to dysphagia as evidenced by meal completion < 50%.  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 She was sent to [facility] and then CIR. She had RLL PE on 06/07/15. She developed altered mental status and acute hypoxic/hypercapnic respiratory failure 06/19/15 and transferred back to ICU.  Patient is on trach collar. Not currently using PEG. Diet has been advanced to dysphagia 3 with nectar thick liquids. Per discussion with RN, patient consumed ~75% of breakfast today. Patient says she is eating approximately half of meals. She agreed to try Ensure supplements and pudding for snacks between meals.   Diet Order:  DIET DYS 3 Room service appropriate?: Yes; Fluid consistency:: Nectar Thick  Skin:  Wound (see comment) (stage 2 to coccyx)  Last BM:  1/9  Height:   Ht Readings from Last 1 Encounters:  06/19/15 5\' 5"  (1.651 m)    Weight:   Wt Readings from Last 1 Encounters:  07/04/15 171 lb 15.3 oz (78 kg)   06/26/15 191 lb 9.3 oz (86.9 kg)       06/28/15 189 lb 2.5 oz (85.8 kg)       06/19/15 174 lb 6.1 oz (79.1 kg)        Ideal Body Weight:  56.8 kg  BMI:  Body mass index is 28.62 kg/(m^2).  Estimated Nutritional Needs:   Kcal:  1700-1900  Protein:  100-115 grams  Fluid:  >/= 1.5 L/day  EDUCATION NEEDS:   No education needs identified at this time  Kerry [name], [place], [place], [place] Pager [phone] After Hours Pager [phone]

## 2015-07-04 NOTE — Progress Notes (Signed)
Pt was suctioned thick white/tan secretions, small amount pt was stable throughout procedure.

## 2015-07-04 NOTE — Progress Notes (Signed)
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 She was sent to an LTAC and then CIR. She had RLL PE on 06/07/15. She developed altered mental status and acute hypoxic/hypercapnic respiratory failure 06/19/15 and was transferred back to ICU from [place]. Was difficult intubation. Found to have MSSA HCAP and E coli UTI.  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  She is in no acute respiratory distress.  She denies chest pain fevers chills nausea vomiting or abdominal pain.  Assessment/Plan:  Acute hypoxic/hypercapnic respiratory failure s/p trach 06/27/2015 - doing well 06/29/15 on ATC - ongoing trach care per PCCM - downsizing or decannulation and timing per PCCM  Probable aspiration event 12/31 - Dysphagia s/p G tube Was previously on dysphagia 2 diet with nectar thick liquids - per SLP pt is a silent aspirator - has been cleared for a D3 diet w/ nectar liquids - stopped tube feeds and following diet intake though PEG remains in place  Acute encephalopathy  Appears to have returned to baseline mental status    MSSA HCAP Has completed a total of 11  days of abx tx - no symptoms of persisting infection  E coli UTI Has completed abx course   RLL PE - R leg DVT - mid dec 2016 Have resumed Xarelto   Anemia of critical illness Hgb stable/improving slowly   Vocal cord paresis As per PCCM  Recent Lt MCA CVA Considering return to CIR  Hx of CAD  HTN Follow BP w/o change today   HLD  Bradycardia Resolved  Hypokalemia Resolved  Hypomagnesemia Resolved  DM CBG reasonably controlled - follow trend w/o change today   Code Status: FULL Family Communication: No family present at time of exam today Disposition Plan: Clinically stable for transfer to medical bed - ongoing trach care/downsizing/decannulation at discretion of critical care - potential eventual transfer to CIR  Consultants: PCCM  Antibiotics: 12/27 Vancomycin > 12/29 12/27 Fortaz > 12/29 12/29 Ancef > 12/31 12/31 Vancomycin >1/2 12/31 Zosyn >1/2 Ceftriaxone 1/2>1/6  DVT prophylaxis: Xarelto   Objective: Blood pressure 143/73, pulse 76, temperature 98.5 F (36.9 C), temperature source Oral, resp. rate 35, height 5\' 5"  (1.651 m), weight 78 kg (171 lb 15.3 oz), SpO2 97 %.  Intake/Output Summary (Last 24 hours) at 07/04/15 1436 Last data filed at 07/04/15 1330  Gross per 24 hour  Intake    300 ml  Output      0 ml  Net    300 ml   Exam: General: No acute respiratory distress on TC - communicative and pleasant  Lungs: Clear to auscultation bilaterally - no wheeze  Cardiovascular: Regular  rate and rhythm - no M Abdomen: Nontender, nondistended, soft, bowel sounds positive - PEG insertion site clean and dry  Extremities: No significant cyanosis, clubbing, or edema bilateral lower extremities  Data Reviewed: Basic Metabolic Panel:  Recent Labs Lab 06/28/15 0310 06/29/15 0705 06/30/15 0340 07/01/15 0530 07/03/15 0525  NA 140 142 142 140 142  K 3.4* 4.0 3.4* 3.9 3.5  CL 106 108 104 102 103  CO2 27 25 27 28 28   GLUCOSE 171* 181* 164* 168*  136*  BUN 9 8 7 9 10   CREATININE 0.42* 0.39* 0.37* 0.39* 0.47  CALCIUM 8.7* 8.7* 9.1 9.3 9.2  MG 1.8 1.8 1.7  --   --   PHOS 3.9 4.8* 4.1  --   --     CBC:  Recent Labs Lab 06/28/15 0310 06/29/15 0705 06/30/15 0340 07/03/15 0525  WBC 7.9 8.9 8.7 8.8  HGB 8.8* 8.8* 9.4* 10.8*  HCT 28.2* 27.8* 30.4* 34.3*  MCV 88.7 88.8 89.1 90.7  PLT 211 178 272 265    Liver Function Tests:  Recent Labs Lab 06/30/15 0340 07/03/15 0525  AST 20 20  ALT 33 31  ALKPHOS 51 46  BILITOT 0.3 0.5  PROT 6.1* 6.4*  ALBUMIN 2.7* 2.9*   CBG:  Recent Labs Lab 07/03/15 1108 07/03/15 1556 07/03/15 2115 07/04/15 0817 07/04/15 1200  GLUCAP 168* 168* 149* 163* 211*    Studies:   Recent x-ray studies have been reviewed in detail by the Attending Physician  Scheduled Meds:  Scheduled Meds: . amLODipine  10 mg Per Tube Daily  . antiseptic oral rinse  7 mL Mouth Rinse q12n4p  . atorvastatin  40 mg Per Tube q1800  . carvedilol  6.25 mg Per Tube BID WC  . chlorhexidine  15 mL Mouth Rinse BID  . feeding supplement (ENSURE ENLIVE)  237 mL Oral Q24H  . folic acid  1 mg Per Tube Daily  . insulin aspart  0-15 Units Subcutaneous TID WC  . lisinopril  10 mg Per Tube Daily  . pantoprazole sodium  40 mg Per Tube Q24H  . rivaroxaban  20 mg Oral Q supper  . thiamine  100 mg Per Tube Daily    Time spent on care of this patient: 25 mins   [facility] , MD   [facility] Office  [phone] Pager - Text Page per Shea Evans as per below:  On-Call/Text Page:      [email or web address]      password [email or web address]  If 7PM-7AM, please contact night-coverage [email or web address] Password [email or web address] 07/04/2015, 2:36 PM   LOS: 15 days

## 2015-07-05 DIAGNOSIS — J3800 Paralysis of vocal cords and larynx, unspecified: Secondary | ICD-10-CM

## 2015-07-05 LAB — GLUCOSE, CAPILLARY
GLUCOSE-CAPILLARY: 213 mg/dL — AB (ref 65–99)
Glucose-Capillary: 143 mg/dL — ABNORMAL HIGH (ref 65–99)
Glucose-Capillary: 162 mg/dL — ABNORMAL HIGH (ref 65–99)
Glucose-Capillary: 126 mg/dL — ABNORMAL HIGH (ref 65–99)

## 2015-07-05 NOTE — Progress Notes (Signed)
I discussed case with Dr. Letta Pate. Pt is not a candidate to readmit to inpt rehab for the third time since [facility] 10/16. Pt needs a longer SNF rehab recovery period vs Home with Home Health and family providing her care at current functional  level. I contacted pt's son, Kerry [name], by phone to discuss and he is aware. He will discuss with his Dad and his brother who live with his parents. I will discuss with RN CM and SW. Please call me for any questions. [phone]

## 2015-07-05 NOTE — Progress Notes (Signed)
RT note: Pt. placed on continuous pulse oximeter from floor until permanent one for pts. room becomes available, RN made aware, RT to monitor.

## 2015-07-05 NOTE — Evaluation (Signed)
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 She was sent to an LTAC and then CIR. She had RLL PE on 06/07/15. She developed altered mental status and acute hypoxic/hypercapnic respiratory failure 06/19/15 and was transferred back to ICU from [place]. Was difficult intubation. Found to have MSSA HCAP and E coli UTI.  Pt's additional PMH includes DM, Lt breast cancer s/p mastectomy, DDD of cervical and lumbar spine, fibromyalgia, carpal tunnel Rt UE.   Clinical Impression   This 60 yo female admitted with above presents to acute OT with deficits below affecting her PLOF of independent before CVA 03/2015. She will continue to benefit from acute OT with follow up OT at SNF to work towards [place] and decreasing burden of care on caregivers prior to hopeful return home.     Follow Up Recommendations  SNF;Supervision/Assistance - 24 hour    Equipment Recommendations  3 in 1 bedside comode;Wheelchair (measurements OT);Wheelchair cushion (measurements OT);Hospital bed;Other (comment) (hoyer lift)       Precautions / Restrictions Precautions Precautions: Fall Precaution Comments: PEG, Rt shoulder subluxation Required Braces or Orthoses: Other Brace/Splint Other Brace/Splint: R foot PRAFO Restrictions Weight Bearing Restrictions: No      Mobility Bed Mobility Overal bed mobility: Needs Assistance Bed Mobility: Rolling;Sidelying to Sit;Sit to Sidelying Rolling: Max assist (to right) Sidelying to sit: Mod assist     Sit to sidelying: Max assist       Balance Overall balance assessment: Needs assistance Sitting-balance support: Feet unsupported;Single extremity supported Sitting balance-Leahy Scale: Fair Sitting balance - Comments: Pt sat EOB 20  minutes working on reaching in all directions, upright posture, propping on RUE with proper alignment and then coming back up to midline sitting--all with min guard A                                    ADL Overall ADL's : Needs assistance/impaired Eating/Feeding: Set up;Supervision/ safety;Bed level   Grooming: Moderate assistance;Sitting   Upper Body Bathing: Moderate assistance;Sitting   Lower Body Bathing: Maximal assistance;Bed level   Upper Body Dressing : Total assistance;Sitting   Lower Body Dressing: Total assistance;Bed level     Toilet Transfer Details (indicate cue type and reason): Did not attempt with + 1 A                 Vision Vision Assessment?: No apparent visual deficits (if so, then is compensating well) Eye Alignment: Within Functional Limits Ocular Range of Motion: Within Functional Limits          Pertinent Vitals/Pain Pain Assessment: Faces Faces Pain Scale: Hurts even more Pain Location: R shoulder with certain movements Pain Descriptors / Indicators: Grimacing;Guarding (will reach for RUE with LUE when she preceives pain) Pain Intervention(s): Monitored during session;Repositioned     Hand Dominance Right   Extremity/Trunk Assessment Upper Extremity Assessment Upper Extremity Assessment: RUE deficits/detail RUE Deficits / Details: flaccid with subluxation RUE Coordination: decreased fine motor;decreased gross motor           Communication Communication Communication: Expressive difficulties;Tracheostomy   Cognition Arousal/Alertness: Awake/alert Behavior During Therapy: WFL for tasks assessed/performed Overall Cognitive Status: Difficult to assess Area of Impairment: Following commands;Safety/judgement;Problem solving  Following Commands: Follows one step commands consistently Safety/Judgement: Decreased awareness of safety;Decreased awareness of deficits   Problem Solving: Slow processing;Decreased  initiation;Difficulty sequencing;Requires verbal cues;Requires tactile cues        Exercises   Other Exercises Other Exercises: Son in room (not one she lives with) was asking about ROM for her RUE; I showed him how she grimaces with shoulder flexion when barely approaching 45 degrees (due to subluxation); that normally we do not recommend going above 90 degrees due to subluxation, however she will not tolerate even 45 degrees. It is important to keep her arm passively moving to help with ease of care for basic ADLs, and at this time no more than 45 degrees flexion or abduction monitoring his moms reactions as her arm is being moved. Other Exercises: We also talked about how important it is that if they do take his mom home that they have her up OOB at least 1 in AM and PM or more (but need to make sure when she is sitting to help her change her position every hour at least for pressure relief).        Home Living Family/patient expects to be discharged to:: Skilled nursing facility Living Arrangements: Spouse/significant other;Children;Non-relatives/Friends Available Help at Discharge: Family;Available 24 hours/day Type of Home: House Home Access: Stairs to enter [employer] of Steps: 3 Entrance Stairs-Rails: Right;Left;Can reach both Home Layout: Two level;Able to live on main level with bedroom/bathroom     Bathroom Shower/Tub: Tub/shower unit;Curtain             Additional Comments: Has hospital bed, 3n1, W/C  Lives With: Spouse;Son    Prior Functioning/Environment Level of Independence: Needs assistance  Gait / Transfers Assistance Needed: Per chart review bed>WC w/ assist in CIR ADL's / Homemaking Assistance Needed: Needs assist w/ bathing, dressing.  Able to feed herself        OT Diagnosis: Generalized weakness;Cognitive deficits;Hemiplegia dominant side   OT Problem List: Decreased strength;Decreased range of motion;Impaired balance (sitting and/or  standing);Pain;Impaired UE functional use;Obesity;Impaired tone;Decreased knowledge of use of DME or AE;Decreased cognition   OT Treatment/Interventions: Self-care/ADL training;Patient/family education;Balance training;Therapeutic activities;DME and/or AE instruction;Cognitive remediation/compensation;Therapeutic exercise    OT Goals(Current goals can be found in the care plan section) Acute Rehab OT Goals Patient Stated Goal: pt unable to state OT Goal Formulation: Patient unable to participate in goal setting Time For Goal Achievement: 07/19/15 Potential to Achieve Goals: Fair  OT Frequency: Min 2X/week              End of Session Nurse Communication:  (NT: pulse Ox was tapped to pt's finger--I brought in tape after I left room for her to retape to pt's finger (they were finishing getting her cleaned up and resettled inbed)  Activity Tolerance: Patient tolerated treatment well Patient left: in bed;with call bell/phone within reach (with NT in room getting vitals and getting ready to change pt)   Time: [id number] OT Time Calculation (min): 40 min Charges:  OT General Charges $OT Visit: 1 Procedure OT Evaluation $OT Eval High Complexity: 1 Procedure OT Treatments $Therapeutic Activity: 23-37 mins  Almon Register [id number] 07/05/2015, 12:26 PM

## 2015-07-05 NOTE — Progress Notes (Addendum)
[facility]                                                                              Patient Demographics  Kerry [name], is a 60 y.o. female, DOB - [birth year], [id number]  Admit date - 06/19/2015   Admitting Physician Colbert Coyer, MD  Outpatient Primary MD for the patient is [name], MD  LOS - 16   No chief complaint on file.     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 She was sent to an LTAC and then CIR. She had RLL PE on 06/07/15. She developed altered mental status and acute hypoxic/hypercapnic respiratory failure 06/19/15 and was transferred back to ICU from [place]. Was difficult intubation. Found to have MSSA HCAP and E coli UTI.  Assessment & Plan   Acute hypoxic/hypercapnic respiratory failure -s/p trach 06/27/2015 - doing well 06/29/15 on ATC - ongoing trach care per PCCM - downsizing or decannulation and timing per PCCM  Probable aspiration event 12/31 - Dysphagia s/p G tube -Was previously on dysphagia 2 diet with nectar thick liquids - per SLP pt is a silent aspirator - has been cleared for a D3 diet w/ nectar liquids - stopped tube feeds and following diet intake though PEG remains in place  Acute encephalopathy  -Appears to have returned to baseline mental status   MSSA HCAP -Has completed a total of 11 days of abx tx - no symptoms of persisting infection  E coli UTI -Has completed abx course   RLL PE - R leg DVT - mid dec 2016 -Continue Xarelto   Anemia of critical illness -Hgb stable/improving slowly  -Monitor CBC  Vocal cord paresis -As per PCCM  Recent Lt MCA CVA -Considering return to CIR  Hx of CAD  HTN Follow BP w/o change today   HLD -Continue statin  Bradycardia -Resolved  Hypokalemia -Resolved  Hypomagnesemia -Resolved  DM -CBG reasonably controlled -Continue ISS and CBG monitoring  Code Status: Full  Family  Communication: None at bedside  Disposition Plan: Admitted. Pending SNF  Time Spent in minutes   30 minutes  Procedures / significant events 12/14 CT chest > RLL PE 12/15 Doppler > DVT Rt common femoral vein  12/16 Laryngoscopy > vocal fold paresis/hypomobility 12/17 CT head > Lt MCA CVA 12/20 D/C from hospital - Admit to [facility]  12/27 Readmit to hospital from [place] - transfer to ICU - intubated 12/27 CT head > ?acute Lt pontine infarct 12/27 EEG > diffuse slowing with focus over Lt posterior temporal region 12/28 MRI brain > no evidence of ischemia in pons > findings more likely wallerian degeneration  12/28 Neurology consulted - off pressors 12/30 self extubated 12/31 aspiration > reintubated 06/26/15 - Alert weaning 06/27/15 - s/p trach 06/27/2015 by DF  Consults   PCCM  DVT Prophylaxis  Xarelto  Lab Results  Component Value Date   PLT 265 07/03/2015    Medications  Scheduled Meds: . amLODipine  10 mg Per Tube Daily  . antiseptic oral rinse  7 mL Mouth Rinse q12n4p  . atorvastatin  40 mg Per Tube q1800  . carvedilol  6.25 mg Per Tube BID WC  . chlorhexidine  15 mL Mouth Rinse BID  . feeding supplement (ENSURE ENLIVE)  237 mL Oral Q24H  . folic acid  1 mg Per Tube Daily  . insulin aspart  0-15 Units Subcutaneous TID WC  . lisinopril  10 mg Per Tube Daily  . pantoprazole sodium  40 mg Per Tube Q24H  . rivaroxaban  20 mg Oral Q supper  . thiamine  100 mg Per Tube Daily   Continuous Infusions:  PRN Meds:.acetaminophen, fentaNYL (SUBLIMAZE) injection, hydrALAZINE, ondansetron, RESOURCE THICKENUP CLEAR  Antibiotics    Anti-infectives    Start     Dose/Rate Route Frequency Ordered Stop   06/25/15 1100  cefTRIAXone (ROCEPHIN) 1 g in dextrose 5 % 50 mL IVPB  Status:  Discontinued     1 g 100 mL/hr over 30 Minutes Intravenous Every 24 hours 06/25/15 1048 06/29/15 1825   06/23/15 1400  piperacillin-tazobactam (ZOSYN) IVPB 3.375 g  Status:  Discontinued     3.375 g 12.5  mL/hr over 240 Minutes Intravenous 3 times per day 06/23/15 0703 06/25/15 1048   06/23/15 0800  piperacillin-tazobactam (ZOSYN) IVPB 3.375 g     3.375 g 100 mL/hr over 30 Minutes Intravenous STAT 06/23/15 0703 06/23/15 0831   06/23/15 0800  vancomycin (VANCOCIN) IVPB 1000 mg/200 mL premix  Status:  Discontinued     1,000 mg 200 mL/hr over 60 Minutes Intravenous Every 8 hours 06/23/15 0703 06/25/15 1048   06/21/15 1400  ceFAZolin (ANCEF) IVPB 1 g/50 mL premix  Status:  Discontinued     1 g 100 mL/hr over 30 Minutes Intravenous 3 times per day 06/21/15 1111 06/23/15 0651   06/21/15 1200  levofloxacin (LEVAQUIN) IVPB 750 mg  Status:  Discontinued     750 mg 100 mL/hr over 90 Minutes Intravenous Every 24 hours 06/21/15 0854 06/21/15 1111   06/19/15 2200  vancomycin (VANCOCIN) IVPB 1000 mg/200 mL premix  Status:  Discontinued     1,000 mg 200 mL/hr over 60 Minutes Intravenous Every 12 hours 06/19/15 0921 06/21/15 0854   06/19/15 1000  cefTAZidime (FORTAZ) 1 g in dextrose 5 % 50 mL IVPB  Status:  Discontinued     1 g 100 mL/hr over 30 Minutes Intravenous Every 8 hours 06/19/15 0921 06/21/15 0854   06/19/15 1000  vancomycin (VANCOCIN) 1,500 mg in sodium chloride 0.9 % 500 mL IVPB     1,500 mg 250 mL/hr over 120 Minutes Intravenous  Once 06/19/15 [id number] 06/19/15 1521      Subjective:   [name] seen and examined today.  Limited communication.  Denies pain, chest pain, shortness of breath, abdominal pain, N/V.  Objective:   Filed Vitals:   07/05/15 0756 07/05/15 0802 07/05/15 1104 07/05/15 1154  BP: 154/66   128/61  Pulse: 78   78  Temp: 97.9 F (36.6 C)   98.3 F (36.8 C)  TempSrc: Oral   Oral  Resp: 16   18  Height:      Weight:      SpO2: 100% 99% 97% 98%    Wt Readings from Last 3 Encounters:  07/05/15 75.8 kg (167 lb 1.7 oz)  06/14/15 76.5 kg (168 lb 10.4 oz)  06/08/15 80.1 kg (176 lb 9.4 oz)     Intake/Output Summary (Last 24 hours) at 07/05/15 1349 Last data  filed at 07/05/15 1346  Gross per 24 hour  Intake    120 ml  Output  0 ml  Net    120 ml    Exam  General: Well developed, NAD  HEENT: NCAT,  mucous membranes moist.   Neck: Trach collar   Cardiovascular: S1 S2 auscultated,RRR, no murmurs  Respiratory: Clear to auscultation bilaterally anteriorly  Abdomen: Soft, nontender, nondistended, + bowel sounds.   Extremities: warm dry without cyanosis clubbing or edema  Data Review   Micro Results No results found for this or any previous visit (from the past 240 hour(s)).  Radiology Reports Dg Chest 2 View  06/16/2015  CLINICAL DATA:  Breathing difficulty. EXAM: CHEST  2 VIEW COMPARISON:  06/05/2015 FINDINGS: Moderate cardiac enlargement. There is no pleural effusion or edema. The lungs appear clear. Spondylosis noted within the thoracic spine. IMPRESSION: 1. Cardiac enlargement. Electronically Signed   By: Kerby Moors M.D.   On: 06/16/2015 17:35   Ct Head Wo Contrast  06/19/2015  CLINICAL DATA:  Altered mental status.  Unresponsive.  Hypoxic. EXAM: CT HEAD WITHOUT CONTRAST TECHNIQUE: Contiguous axial images were obtained from the base of the skull through the vertex without intravenous contrast. COMPARISON:  06/08/2015 FINDINGS: There is no evidence of mass effect, midline shift or extra-axial fluid collections. There is no evidence of a space-occupying lesion or intracranial hemorrhage. There is no evidence of a cortical-based area of acute infarction. There is a small low-attenuation area in the left pons concerning for an acute left pontine infarct. There is a large chronic left MCA territory infarct. The ventricles and sulci are appropriate for the patient's age. The basal cisterns are patent. Visualized portions of the orbits are unremarkable. The visualized portions of the paranasal sinuses and mastoid air cells are unremarkable. The osseous structures are unremarkable. IMPRESSION: 1. Small low-attenuation area in the left  pons concerning for an acute left pontine infarct. 2. Old left MCA territory infarct. Electronically Signed   By: Kathreen Devoid   On: 06/19/2015 13:32   Ct Head Wo Contrast  06/09/2015  CLINICAL DATA:  Initial evaluation for deteriorating mental status. EXAM: CT HEAD WITHOUT CONTRAST TECHNIQUE: Contiguous axial images were obtained from the base of the skull through the vertex without intravenous contrast. COMPARISON:  Prior study from 04/16/2015. FINDINGS: There has been continued interval evolution of large volume left MCA territory infarct, overall stable in size and distribution as compared to previous study. There is increased volume loss and encephalomalacia within the area of infarction. No evidence for associated hemorrhage. Previously seen midline shift has essentially resolved. No new acute large vessel territory infarct. No intracranial hemorrhage. No mass lesion. No hydrocephalus. Basilar cisterns are patent. No extra-axial fluid collection. Mild chronic microvascular ischemic disease noted within the periventricular white matter. Scattered atheromatous plaque within the carotid siphons. Scalp soft tissues within normal limits. No acute abnormality about the orbits. Paranasal sinuses are clear.  No mastoid effusion. Calvarium intact. IMPRESSION: 1. Normal expected interval evolutional change of large left MCA territory infarct. Associated edema has largely resolved. 2. No new intracranial process. Electronically Signed   By: Jeannine Boga M.D.   On: 06/09/2015 00:47   Ct Angio Chest Pe W/cm &/or Wo Cm  06/07/2015  CLINICAL DATA:  Tachycardia. EKG with sinus tachycardia and T-wave abnormality. EXAM: CT ANGIOGRAPHY CHEST WITH CONTRAST TECHNIQUE: Multidetector CT imaging of the chest was performed using the standard protocol during bolus administration of intravenous contrast. Multiplanar CT image reconstructions and MIPs were obtained to evaluate the vascular anatomy. CONTRAST:  80 mL  OMNIPAQUE IOHEXOL 350 MG/ML SOLN COMPARISON:  04/20/2015  FINDINGS: Technically adequate study with good opacification of the central and segmental pulmonary arteries. Filling defects are demonstrated in the right lower lobe posterior subsegmental branch vessels consistent with pulmonary embolus. This may be acute or chronic. No large central pulmonary emboli. Possible filling defect in the left atrial appendage. Cardiac enlargement with evidence of left ventricular hypertrophy. R 8 RV ratio is less than 1 suggesting no evidence of right heart strain. Small pericardial effusion or thickening. Esophagus is decompressed. No significant lymphadenopathy in the chest. Evaluation of lungs is limited due to respiratory motion artifact. Probable atelectasis in the lung bases. Emphysematous changes in the upper lungs. No pleural effusions. No pneumothorax. Included portions of the upper abdominal organs demonstrate calcified granulomas in the spleen. Degenerative changes in the spine. Review of the MIP images confirms the above findings. IMPRESSION: Subsegmental right lower lobe pulmonary emboli. No evidence of right heart strain. Possible filling defect in the left atrial appendage. Cardiac enlargement with left ventricular hypertrophy. No evidence of active pulmonary disease. These results were called by telephone at the time of interpretation on 06/07/2015 at 12:44 am to [facility], the patient's nurse on [facility], who verbally acknowledged these results. Electronically Signed   By: Lucienne Capers M.D.   On: 06/07/2015 00:47   Mr Brain Wo Contrast  06/21/2015  CLINICAL DATA:  Follow-up new pontine stroke. History of LEFT-sided stroke. EXAM: MRI HEAD WITHOUT CONTRAST TECHNIQUE: Multiplanar, multiecho pulse sequences of the brain and surrounding structures were obtained without intravenous contrast. COMPARISON:  CT head June 19, 2015 FINDINGS: Patchy T2 bright signal within the LEFT midbrain and pons corresponding to CT  abnormality without superimposed reduced diffusion to suggest acute ischemia. Extensive reduced diffusion LEFT frontotemporal parietal lobes, predominately with T2 shine through. Patchy areas of low ADC value, however there is underlying susceptibility artifact and, this is likely spurious. LEFT basal ganglia and LEFT thalamus infarcts. Mild ex vacuo dilatation LEFT lateral ventricle. No hydrocephalus. No midline shift or mass effect. No mass lesions. No abnormal extra-axial fluid collection. Loss of LEFT internal carotid artery flow void from the skullbase. Probable retrograde flow as there is LEFT MCA and bilateral anterior cerebral artery flow voids. Ocular globes and orbital contents are normal. Small paranasal sinus air-fluid levels. Life-support lines in place. Moderate bilateral mastoid effusions. No abnormal sellar expansion. No cerebellar tonsillar ectopia. No suspicious calvarial bone marrow signal. IMPRESSION: No evidence of acute ischemia with particular attention to the pons. Old LEFT hemorrhagic MCA territory infarct. Chronically occluded LEFT internal carotid artery. T2 bright signal within the LEFT pons and midbrain, favoring early wallerian degeneration, less likely old ischemia. Electronically Signed   By: Elon Alas M.D.   On: 06/21/2015 04:07   Dg Chest Port 1 View  06/27/2015  CLINICAL DATA:  Acute respiratory failure.  Tracheostomy. EXAM: PORTABLE CHEST 1 VIEW COMPARISON:  06/26/2015 FINDINGS: Endotracheal tube has been rib moved. A tracheostomy tube has been placed and projects over the airway, terminating at the inferior margin of the clavicular heads. The cardiac silhouette remains mildly enlarged. Right basilar airspace opacity has increased. Mild left basilar opacity does not appear significantly changed. No sizable pleural effusion or pneumothorax is identified. IMPRESSION: Increased right basilar airspace disease. Electronically Signed   By: Logan Bores M.D.   On: 06/27/2015  15:06   Dg Chest Port 1 View  06/26/2015  CLINICAL DATA:  Respiratory failure. Subsequent encounter. History of hypertension and diabetes. EXAM: PORTABLE CHEST 1 VIEW COMPARISON:  06/26/2015 at 6:11 a.m. FINDINGS: Interstitial  opacities and patchy lung base airspace opacities have improved consistent with improved pulmonary edema. No new lung opacities. No convincing pleural effusion and no pneumothorax. Endotracheal tube now projects 1.7 cm above the carina. IMPRESSION: Significantly improved lung aeration since the earlier study consistent with improved pulmonary edema. No new abnormalities. Electronically Signed   By: Lajean Manes M.D.   On: 06/26/2015 12:21   Dg Chest Port 1 View  06/26/2015  CLINICAL DATA:  Hypoxia. EXAM: PORTABLE CHEST 1 VIEW COMPARISON:  June 25, 2015 FINDINGS: Endotracheal tube tip is 1.2 cm above the carina. No pneumothorax. There is interstitial edema in the lung base regions. There is no airspace consolidation. There is cardiomegaly. The pulmonary vascularity is normal. No adenopathy. No bone lesions. IMPRESSION: Bibasilar interstitial edema with mild cardiac enlargement. Suspect a degree of congestive heart failure. Currently there is no airspace consolidation appreciable. Endotracheal tube tip is 1.2 cm above the carina. No pneumothorax. It may be prudent to consider withdrawing endotracheal tube 2 to 3 cm. Electronically Signed   By: Lowella Grip III M.D.   On: 06/26/2015 07:53   Dg Chest Port 1 View  06/25/2015  CLINICAL DATA:  Respiratory distress. EXAM: PORTABLE CHEST 1 VIEW COMPARISON:  Earlier today at 712 hours FINDINGS: 1611 hours Endotracheal tube terminates 2.0 cm above carina.Patient rotated right. Cardiomegaly accentuated by AP portable technique. No pleural effusion or pneumothorax. Low lung volumes with resultant pulmonary interstitial prominence. Persistent right base airspace disease. Clear left lung. IMPRESSION: No change in right base airspace disease.  Cardiomegaly and low lung volumes, without congestive failure. Electronically Signed   By: Abigail Miyamoto M.D.   On: 06/25/2015 17:05   Dg Chest Port 1 View  06/25/2015  CLINICAL DATA:  Respiratory failure. EXAM: PORTABLE CHEST 1 VIEW COMPARISON:  06/24/2015 and 06/23/2015 FINDINGS: Endotracheal tube in good position. Further clearing of the infiltrate in the right lung. Heart size and vascularity are now normal. Left lung is clear. No effusions. IMPRESSION: Further clearing of the infiltrate in the right lung with slight residual infiltrate at the base. Electronically Signed   By: Lorriane Shire M.D.   On: 06/25/2015 09:22   Dg Chest Port 1 View  06/24/2015  CLINICAL DATA:  Healthcare associated pneumonia. EXAM: PORTABLE CHEST 1 VIEW COMPARISON:  06/23/2015 FINDINGS: Go endotracheal tube is in place 2.6 cm above the carina. Heart size is upper limits normal. Patchy infiltrate is identified throughout the right lung and at the left lung base, slightly improved compared prior study. IMPRESSION: Persistent bilateral infiltrate showing slight interval improvement in aeration. Electronically Signed   By: Nolon Nations M.D.   On: 06/24/2015 08:58   Dg Chest Port 1 View  06/23/2015  CLINICAL DATA:  Endotracheal tube placement.  Initial encounter. EXAM: PORTABLE CHEST 1 VIEW COMPARISON:  Chest radiograph performed 06/22/2015 FINDINGS: The patient's endotracheal tube is seen ending 2-3 cm above the carina. New diffuse right-sided airspace opacification raises concern for pneumonia. No definite pleural effusion or pneumothorax is seen. Minimal left basilar airspace opacity is also seen. No pneumothorax identified The cardiomediastinal silhouette is borderline enlarged. No acute osseous abnormalities are seen. IMPRESSION: 1. Endotracheal tube seen ending 2-3 cm above the carina. 2. Diffuse new right-sided airspace opacification raises concern for pneumonia. 3. Borderline cardiomegaly. Electronically Signed   By:  Garald Balding M.D.   On: 06/23/2015 02:33   Dg Chest Port 1 View  06/22/2015  CLINICAL DATA:  60 year old female with Healthcare associated pneumonia. Subsegmental pulmonary emboli earlier  this month. Initial encounter. EXAM: PORTABLE CHEST 1 VIEW COMPARISON:  06/21/2015 and earlier. FINDINGS: Portable AP semi upright view at 0617 hours. Stable endotracheal tube. Stable visualized enteric tube. Stable cardiac size and mediastinal contours. Lung volumes are within normal limits. Allowing for portable technique, the lungs are clear. No pneumothorax or pleural effusion. IMPRESSION: 1.  Stable lines and tubes. 2.  No acute cardiopulmonary abnormality. Electronically Signed   By: Genevie Ann M.D.   On: 06/22/2015 07:57   Dg Chest Port 1 View  06/21/2015  CLINICAL DATA:  Respiratory failure.  Followup exam. EXAM: PORTABLE CHEST 1 VIEW COMPARISON:  06/20/2015 FINDINGS: Endotracheal tube tip projects 2.9 cm above the carina, stable and well-positioned. Orogastric tube passes below the diaphragm into the stomach also stable. Cardiac silhouette there is normal in size and configuration. No mediastinal or hilar masses. There is persistent medial lung base opacity. This may reflect atelectasis or pneumonia, with atelectasis favored. No pulmonary edema. No evidence of pneumothorax. IMPRESSION: 1. Persistent medial lung base opacity most likely due to atelectasis. Pneumonia is possible. 2. Support apparatus well positioned. 3. No significant change from previous day's study allowing for differences in patient positioning and technique. Electronically Signed   By: Lajean Manes M.D.   On: 06/21/2015 08:16   Dg Chest Port 1 View  06/20/2015  CLINICAL DATA:  Hypoxia EXAM: PORTABLE CHEST 1 VIEW COMPARISON:  June 19, 2015 FINDINGS: Endotracheal tube tip is 3.8 cm above the carina. Nasogastric tube tip and side port are in the distal stomach region. No apparent pneumothorax. There is a small area of infiltrate in the right  base. Lungs elsewhere clear. Heart is upper normal in size with pulmonary vascularity within normal limits. No adenopathy. IMPRESSION: Tube positions as described without pneumothorax. Small area of infiltrate right base. Lungs elsewhere clear. No change in cardiac silhouette. Electronically Signed   By: Lowella Grip III M.D.   On: 06/20/2015 07:30   Dg Chest Port 1 View  06/19/2015  CLINICAL DATA:  60 year old female status post adjustment of the endotracheal tube. EXAM: PORTABLE CHEST 1 VIEW COMPARISON:  Earlier chest radiograph 06/19/2015 FINDINGS: There has been interval retraction of the endotracheal tube with tip female approximately 5 cm above the carina. There has been interval removal of the enteric tube. No significant interval change in the appearance of the lungs. Pleural effusion or pneumothorax. Stable cardiac silhouette. The osseous structures appear unremarkable. IMPRESSION: Interval retraction of the endotracheal tube with tip now approximately 5 cm above the carina. Electronically Signed   By: Anner Crete M.D.   On: 06/19/2015 02:45   Dg Chest Port 1 View  06/19/2015  CLINICAL DATA:  Endotracheal tube repositioning.  Initial encounter. EXAM: PORTABLE CHEST 1 VIEW COMPARISON:  Chest radiograph performed earlier today at 1:23 a.m. FINDINGS: The patient's endotracheal tube is seen ending 1-2 cm above the carina. This could be retracted 1-2 cm, as deemed clinically appropriate. The enteric tube is seen extending below the diaphragm. Right basilar airspace opacity appears improved, and may reflect atelectasis or possibly mild pneumonia. No definite pleural effusion or pneumothorax is seen. The cardiomediastinal silhouette is normal in size. No acute osseous abnormalities are identified. External pacing pads are noted. IMPRESSION: 1. Endotracheal tube seen ending 1-2 cm above the carina. This could be retracted 1-2 cm, as deemed clinically appropriate. 2. Right basilar airspace opacity  appears improved, and may reflect atelectasis or possibly mild pneumonia. These results were called by telephone at the time of interpretation  on 06/19/2015 at 1:58 am to [facility] on [facility], who verbally acknowledged these results. Electronically Signed   By: Garald Balding M.D.   On: 06/19/2015 02:04   Dg Chest Port 1 View  06/19/2015  CLINICAL DATA:  Endotracheal tube placement.  Initial encounter. EXAM: PORTABLE CHEST 1 VIEW COMPARISON:  None. FINDINGS: The patient's endotracheal tube is seen ending just above the carina. This should be retracted 2-3 cm. The enteric tube is noted extending below the diaphragm. The lungs are well-aerated. Right basilar airspace opacity may reflect atelectasis or pneumonia. A small right pleural effusion is suspected. No pneumothorax is seen. The cardiomediastinal silhouette is within normal limits. No acute osseous abnormalities are seen. External pacing pads are noted. IMPRESSION: 1. Endotracheal tube seen ending just above the carina. This should be retracted 3 cm. 2. Right basilar airspace opacity may reflect atelectasis or pneumonia. Small right pleural effusion suspected. These results were called by telephone at the time of interpretation on 06/19/2015 at 1:58 am to [facility] on [facility], who verbally acknowledged these results. Electronically Signed   By: Garald Balding M.D.   On: 06/19/2015 02:02   Dg Chest Port 1 View  06/19/2015  CLINICAL DATA:  60 year old female with shortness of breath EXAM: PORTABLE CHEST 1 VIEW COMPARISON:  Radiograph dated 06/18/2015 FINDINGS: The patient is tilted to the right. There is stable cardiomegaly. Bibasilar linear atelectatic changes/scarring noted. There is no focal consolidation, pleural effusion, or pneumothorax. There is degenerative changes of the spine. No acute fracture. IMPRESSION: Stable cardiomegaly.  No acute cardiopulmonary process. Electronically Signed   By: Anner Crete M.D.   On: 06/19/2015 00:41   Dg Chest  Port 1 View  06/18/2015  CLINICAL DATA:  Acute onset of shortness of breath on exertion. Initial encounter. EXAM: PORTABLE CHEST 1 VIEW COMPARISON:  Chest radiograph performed 06/16/2015 FINDINGS: The lungs are well-aerated. Mild bibasilar atelectasis is noted. There is no evidence of pleural effusion or pneumothorax. The cardiomediastinal silhouette is enlarged. No acute osseous abnormalities are seen. IMPRESSION: Mild bibasilar atelectasis noted.  Cardiomegaly noted. Electronically Signed   By: Garald Balding M.D.   On: 06/18/2015 04:44   Dg Abd Portable 1v  06/19/2015  CLINICAL DATA:  Evaluate orogastric tube placement EXAM: PORTABLE ABDOMEN - 1 VIEW COMPARISON:  06/19/15, 06/04/15 FINDINGS: OG tube projects over the stomach with tip over the anticipated position of the antrum. PEG tube also projects over the stomach. Oral contrast noted throughout the large bowel. 88mm radiodensity projects over the left abdomen over a loop of large bowel and is not seen on 06/04/15. IMPRESSION: OG tube as described. Abnormal density could represent foreign body either within the abdominal cavity or on the body wall, as well as possibly ingested material. Electronically Signed   By: Skipper Cliche M.D.   On: 06/19/2015 11:18   Dg Abd Portable 1v  06/19/2015  CLINICAL DATA:  Orogastric tube placement.  Initial encounter. EXAM: PORTABLE ABDOMEN - 1 VIEW COMPARISON:  Abdominal radiograph performed 06/04/2015 FINDINGS: The patient's enteric tube is noted ending overlying the body of the stomach. The endotracheal tube is noted ending just above the carina. This should be retracted 2-3 cm. The visualized bowel gas pattern is difficult to fully assess due to motion artifact, though contrast is seen within the colon. Right basilar airspace opacity is noted. No pleural effusion is seen. No acute osseous abnormalities are identified. External pacing pads are noted. IMPRESSION: 1. Enteric tube noted ending overlying the body of  the stomach. 2. Endotracheal tube seen ending just above the carina. This should be retracted 2-3 cm. 3. Right basilar airspace opacity noted. These results were called by telephone at the time of interpretation on 06/19/2015 at 1:58 am to [facility] on [facility], who verbally acknowledged these results. Electronically Signed   By: Garald Balding M.D.   On: 06/19/2015 02:00   Dg Swallowing Func-speech Pathology  07/02/2015  Objective Swallowing Evaluation:   Patient Details Name: CASSADEE MUNTEAN MRN: [id number] Date of Birth: 07-03-1955 Today's Date: 07/02/2015 Time: SLP Start Time (ACUTE ONLY): 1040-SLP Stop Time (ACUTE ONLY): 1100 SLP Time Calculation (min) (ACUTE ONLY): 20 min Past Medical History: @PMH @ Past Surgical History: Past Surgical History Procedure Laterality Date . Cardiac surgery   . Appendectomy   . Breast surgery   . Mastectomy Left 03/2007 . Cardiac catheterization  2005 . Vein surgery Right 2005 . Tubal ligation Right  . Radiology with anesthesia N/A 04/11/2015   Procedure: RADIOLOGY WITH ANESTHESIA;  Surgeon: Luanne Bras, MD;  Location: [place];  Service: Radiology;  Laterality: N/A; . Cardiac catheterization N/A 04/13/2015   Procedure: Temporary Pacemaker;  Surgeon: Peter M Martinique, MD;  Location: [place] CV LAB;  Service: Cardiovascular;  Laterality: N/A; . Peg placement N/A 04/18/2015   Procedure: PERCUTANEOUS ENDOSCOPIC GASTROSTOMY (PEG) PLACEMENT;  Surgeon: Judeth Horn, MD;  Location: [place];  Service: General;  Laterality: N/A;  bedside . 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 She was sent to an LTAC and then CIR. She had PEG and trach 10/26 (has since been decannulated). She had RLL PE on 06/07/15. She developed altered mental status and acute  hypoxic/hypercapnic respiratory failure 06/19/15 and was transferred back to ICU from [place]. CT of the head with ? acute left pontine infarct. Was difficult intubation. s/p tracheostomy 1/4. CCM notes vocal cord paresis/hypomobility per laryngoscopy 12/16. Found to have MSSA HCAP and E coli UTI. Subjective: pt alert, aphasic, inspiratory stridor Assessment / Plan / Recommendation CHL IP CLINICAL IMPRESSIONS 07/02/2015 Therapy Diagnosis Moderate pharyngeal phase dysphagia Clinical Impression Pt presents with a primary sensory based oropharygneal dysphagia. Pt tested with and without PMSV (see treatment note), no change in function. With liquid boluses pt has an intermittent delay in swallow initiation. With thin liquids this delay occurs with the initial sips resulting in silent aspiration before the swallow; pt eventually responds with a cough when aspirate fully descends trachea. Pt consistently protected airway consuming nectar, but did have some delay with an independent second swallow to fully clear trace lingual residual. Pts function with nectar was adequate even with large consecutive straw sips. Also trialed a chin tuck with thin liquids with great success, no penetration with consecutive straw sips; can be trialed in therapy. Recommend Dys 3 (mechanical soft) diet with nectar thick liquids will follow for tolerance.  Impact on safety and function Mild aspiration risk   CHL IP TREATMENT RECOMMENDATION 07/02/2015 Treatment Recommendations Therapy as outlined in treatment plan below   Prognosis 07/02/2015 Prognosis for Safe Diet Advancement Good Barriers to Reach Goals Language deficits Barriers/Prognosis Comment -- CHL IP DIET RECOMMENDATION 07/02/2015 SLP Diet Recommendations Dysphagia 3 (Mech soft) solids;Nectar thick liquid Liquid Administration via Cup;Straw Medication Administration Whole meds with puree Compensations Slow rate;Minimize environmental distractions Postural Changes Seated upright at 90 degrees  CHL IP OTHER RECOMMENDATIONS 07/02/2015 Recommended Consults -- Oral Care Recommendations Oral care BID Other Recommendations Order thickener from pharmacy   CHL IP FOLLOW UP RECOMMENDATIONS 07/02/2015 Follow up Recommendations Inpatient Rehab   CHL IP FREQUENCY AND DURATION 07/02/2015 Speech Therapy Frequency (ACUTE ONLY) min 2x/week Treatment Duration 2 weeks      CHL IP ORAL PHASE 07/02/2015 Oral Phase WFL Oral - Pudding Teaspoon -- Oral - Pudding Cup -- Oral - Honey Teaspoon -- Oral - Honey Cup NT Oral - Nectar Teaspoon -- Oral - Nectar Cup WFL Oral - Nectar Straw -- Oral - Thin Teaspoon -- Oral - Thin Cup WFL Oral - Thin Straw WFL Oral - Puree WFL Oral - Mech Soft -- Oral - Regular WFL Oral - Multi-Consistency -- Oral - Pill -- Oral Phase - Comment --  CHL IP PHARYNGEAL PHASE 07/02/2015 Pharyngeal Phase Impaired Pharyngeal- Pudding Teaspoon -- Pharyngeal -- Pharyngeal- Pudding Cup -- Pharyngeal -- Pharyngeal- Honey Teaspoon -- Pharyngeal -- Pharyngeal- Honey Cup NT Pharyngeal -- Pharyngeal- Nectar Teaspoon -- Pharyngeal -- Pharyngeal- Nectar Cup Delayed swallow initiation-pyriform sinuses Pharyngeal Material does not enter airway Pharyngeal- Nectar Straw Delayed swallow initiation-pyriform sinuses Pharyngeal -- Pharyngeal- Thin Teaspoon -- Pharyngeal -- Pharyngeal- Thin Cup Delayed swallow initiation-pyriform sinuses;Penetration/Aspiration before swallow Pharyngeal Material enters airway, passes BELOW cords without attempt by patient to eject out (silent aspiration) Pharyngeal- Thin Straw WFL;Compensatory strategies attempted (with notebox) Pharyngeal Material does not enter airway Pharyngeal- Puree WFL Pharyngeal -- Pharyngeal- Mechanical Soft -- Pharyngeal -- Pharyngeal- Regular WFL Pharyngeal -- Pharyngeal- Multi-consistency -- Pharyngeal -- Pharyngeal- Pill WFL Pharyngeal -- Pharyngeal Comment --  CHL IP CERVICAL ESOPHAGEAL PHASE 07/02/2015 Cervical Esophageal Phase WFL Pudding Teaspoon -- Pudding Cup -- Honey Teaspoon  -- Honey Cup -- Nectar Teaspoon -- Nectar Cup -- Nectar Straw -- Thin Teaspoon -- Thin Cup -- Thin Straw -- Puree -- Mechanical Soft -- Regular -- Multi-consistency -- Pill -- Cervical Esophageal Comment -- Herbie Baltimore, MA CCC-SLP [phone] Lynann Beaver 07/02/2015, 11:26 AM              Dg Swallowing Func-speech Pathology  06/15/2015  Objective Swallowing Evaluation:   Patient Details Name: KWANDA DEWBRE MRN: [id number] Date of Birth: 23-Dec-1955 Today's Date: 06/15/2015 Time: SLP Start Time: 0855-SLP Stop Time : 0922 SLP Time Calculation (min) : 27 min Past Medical History: Past Medical History Diagnosis Date . Hypertension  . Coronary artery disease  . Diabetes mellitus  . Breast cancer ([place]) 03/10/2007   Left breat . Degenerative disc disease, cervical  . Degenerative disc disease, lumbar  . Fibromyalgia  . Hyperlipidemia  . Carpal tunnel syndrome on right  . PE (pulmonary embolism)  . UTI (lower urinary tract infection)  . GIB (gastrointestinal bleeding)  . Elevated troponin  . HTN (hypertension) 06/13/2015 Past Surgical History: Past Surgical History Procedure Laterality Date . Cardiac surgery   . Appendectomy   . Breast surgery   . Mastectomy Left 03/2007 . Cardiac catheterization  2005 . Vein surgery Right 2005 . Tubal ligation Right  . Radiology with anesthesia N/A 04/11/2015   Procedure: RADIOLOGY WITH ANESTHESIA;  Surgeon: Luanne Bras, MD;  Location: [place];  Service: Radiology;  Laterality: N/A; . Cardiac catheterization N/A 04/13/2015   Procedure: Temporary Pacemaker;  Surgeon: Peter M Martinique, MD;  Location: [place] CV [facility];  Service: Cardiovascular;  Laterality: N/A; . Peg placement N/A 04/18/2015   Procedure: PERCUTANEOUS ENDOSCOPIC GASTROSTOMY (PEG) PLACEMENT;  Surgeon: Judeth Horn, MD;  Location: [place];  Service:  General;  Laterality: N/A;  bedside . Esophagogastroduodenoscopy N/A 04/18/2015   Procedure: ESOPHAGOGASTRODUODENOSCOPY (EGD);  Surgeon: Judeth Horn, MD;   Location: [facility] ENDOSCOPY;  Service: General;  Laterality: N/A; HPI: pt presents with dense L MCA Infarct involving L Temporal, Frontoparietal, and L Basal Ganglia regions and was started on TPA, but unable to complete full dose due to hypertension, but did recieve IR intervention. Pt extubated 10/30. pt with hx of DM, HTN, Breast CA s/p Mastectomy, and Fibromyalgia. Patient transferred to [facility] and then Inpatient Rehab. Transferred back to acute on 06/07/15 due to PE. Readmitted to CIR on 06/13/2015 for completion of family education prior to discharge home.  Assessment / Plan / Recommendation CHL IP CLINICAL IMPRESSIONS 06/15/2015 Therapy Diagnosis Mild oral phase dysphagia;Moderate pharyngeal phase dysphagia Clinical Impression Pt presents with slight decline in swallowing function in comparison to previous MBS, suspect due to deconditioning s/p acute treatment for PE.  Overall pt remains with a mild oral phase dysphagia and moderate pharyngeal phase dysphagia with both sensory and motor components.  Oral phase deficits again are characterized by right sided labial and lingual weakness which results in weakened manipulation and prolonged oral transit of boluses to the oropharynx.  Decreased pharyngeal sensation and decreased base of tongue retraction also resulted in premature spillage of materials into the pharynx and delayed swallow initiation.  Swallow response was delayed to the vallecula with thickened liquids and solids, and further delayed to the pyriforms with thin liquids.  Due to delayed swallow initiation, pt exhibited deep, silent penetration of nectar thick liquids on initial cup sip and again when challenged with larger boluses; however, smaller boluses were tolerated without airway compromise.  Post swallow, moderate vallecular residuals were noted with thickened liquids and solids and mild-moderate pyriform residue with thin liquids due to pharyngeal weakness.  Consistent deep  penetration during the swallow and eventual unsensed aspiration of residuals after the swallow was visualized with thin liquids, even when boluses were strictly controlled via teaspoon.  Suspect increase in frequency and severity of airway compromise in comparison to previous study to be related to increased respiratory rate and effort required for breathing.  Pt continues to spontaneously use extra swallows which, in combination with SLP interventions for alternating solids and liquids effectively minimized pharyngeal residue.  Recommend that pt remain on dys 2 textures and nectar thick liquids with full supervision for use of swallowing precautions.  Do not recommend continuing with water protocol at this time due to pt's likely diminished functional reserve.  Prognosis for advancement fair with trials of advanced consistencies, SLP follow up for use of safe swallowing strategies, and repeat objective swallow study.   Impact on safety and function Moderate aspiration risk   CHL IP TREATMENT RECOMMENDATION 06/12/2015 Treatment Recommendations Therapy as outlined in treatment plan below   Prognosis 06/15/2015 Prognosis for Safe Diet Advancement Fair Barriers to Reach Goals Cognitive deficits;Language deficits;Motivation Barriers/Prognosis Comment -- CHL IP DIET RECOMMENDATION 06/15/2015 SLP Diet Recommendations Dysphagia 2 (Fine chop) solids;Nectar thick liquid Liquid Administration via Cup Medication Administration Other (Comment) Compensations Slow rate;Small sips/bites;Minimize environmental distractions;Multiple dry swallows after each bite/sip;Follow solids with liquid Postural Changes Seated upright at 90 degrees            CHL IP ORAL PHASE 06/15/2015 Oral Phase Impaired Oral - Pudding Teaspoon -- Oral - Pudding Cup -- Oral - Honey Teaspoon -- Oral - Honey Cup -- Oral - Nectar Teaspoon -- Oral - Nectar Cup Reduced posterior propulsion;Lingual/palatal residue;Piecemeal swallowing;Decreased bolus  cohesion;Delayed oral transit;Premature spillage Oral - Nectar Straw -- Oral - Thin Teaspoon -- Oral - Thin Cup Weak lingual manipulation;Reduced posterior propulsion;Lingual/palatal residue;Piecemeal swallowing;Delayed oral transit;Decreased bolus cohesion;Premature spillage Oral - Thin Straw -- Oral - Puree Weak lingual manipulation;Reduced posterior propulsion;Lingual/palatal residue;Premature spillage;Delayed oral transit Oral - Mech Soft -- Oral - Regular Weak lingual manipulation;Reduced posterior propulsion;Lingual/palatal residue;Piecemeal swallowing;Impaired mastication;Delayed oral transit;Decreased bolus cohesion;Premature spillage Oral - Multi-Consistency -- Oral - Pill -- Oral Phase - Comment --  CHL IP PHARYNGEAL PHASE 06/15/2015 Pharyngeal Phase -- Pharyngeal- Pudding Teaspoon -- Pharyngeal -- Pharyngeal- Pudding Cup -- Pharyngeal -- Pharyngeal- Honey Teaspoon -- Pharyngeal -- Pharyngeal- Honey Cup -- Pharyngeal -- Pharyngeal- Nectar Teaspoon -- Pharyngeal -- Pharyngeal- Nectar Cup Delayed swallow initiation-vallecula;Delayed swallow initiation-pyriform sinuses;Reduced laryngeal elevation;Reduced airway/laryngeal closure;Reduced tongue base retraction;Penetration/Aspiration during swallow;Pharyngeal residue - valleculae Pharyngeal Material enters airway, CONTACTS cords and not ejected out Pharyngeal- Nectar Straw -- Pharyngeal -- Pharyngeal- Thin Teaspoon -- Pharyngeal -- Pharyngeal- Thin Cup Reduced airway/laryngeal closure;Reduced laryngeal elevation;Delayed swallow initiation-pyriform sinuses;Reduced tongue base retraction;Penetration/Aspiration during swallow;Penetration/Apiration after swallow;Pharyngeal residue - pyriform;Pharyngeal residue - valleculae Pharyngeal Material enters airway, passes BELOW cords without attempt by patient to eject out (silent aspiration) Pharyngeal- Thin Straw -- Pharyngeal -- Pharyngeal- Puree Delayed swallow initiation-vallecula;Pharyngeal residue -  valleculae;Reduced laryngeal elevation;Reduced airway/laryngeal closure;Reduced tongue base retraction Pharyngeal -- Pharyngeal- Mechanical Soft -- Pharyngeal -- Pharyngeal- Regular Delayed swallow initiation-vallecula;Reduced tongue base retraction;Pharyngeal residue - valleculae;Reduced laryngeal elevation Pharyngeal -- Pharyngeal- Multi-consistency -- Pharyngeal -- Pharyngeal- Pill -- Pharyngeal -- Pharyngeal Comment --  No flowsheet data found. Page, Selinda Orion 06/15/2015, 12:30 PM              Dg Swallowing Func-speech Pathology  06/06/2015  Objective Swallowing Evaluation:   Patient Details Name: Kerry [name] MRN: [id number] Date of Birth: 1956/02/20 Today's Date: 06/06/2015 Time: SLP Start Time (ACUTE ONLY): 0902-SLP Stop Time (ACUTE ONLY): 0930 SLP Time Calculation (min) (ACUTE ONLY): 28 min Past Medical History: Past Medical History Diagnosis Date . Hypertension  . Coronary artery disease  . Diabetes mellitus  . Breast cancer ([place]) 03/10/2007   Left breat . Degenerative disc disease, cervical  . Degenerative disc disease, lumbar  . Fibromyalgia  . Hyperlipidemia  . Carpal tunnel syndrome on right  Past Surgical History: Past Surgical History Procedure Laterality Date . Cardiac surgery   . Appendectomy   . Breast surgery   . Mastectomy Left 03/2007 . Cardiac catheterization  2005 . Vein surgery Right 2005 . Tubal ligation Right  . Radiology with anesthesia N/A 04/11/2015   Procedure: RADIOLOGY WITH ANESTHESIA;  Surgeon: Luanne Bras, MD;  Location: [place];  Service: Radiology;  Laterality: N/A; . Cardiac catheterization N/A 04/13/2015   Procedure: Temporary Pacemaker;  Surgeon: Peter M Martinique, MD;  Location: [place] CV [facility];  Service: Cardiovascular;  Laterality: N/A; . Peg placement N/A 04/18/2015   Procedure: PERCUTANEOUS ENDOSCOPIC GASTROSTOMY (PEG) PLACEMENT;  Surgeon: Judeth Horn, MD;  Location: [place];  Service: General;  Laterality: N/A;  bedside . Esophagogastroduodenoscopy N/A  04/18/2015   Procedure: ESOPHAGOGASTRODUODENOSCOPY (EGD);  Surgeon: Judeth Horn, MD;  Location: [facility] ENDOSCOPY;  Service: General;  Laterality: N/A; HPI: Kerry [name] is a 60 y.o. female who was admitted on 04/11/15 with difficulty speaking, right facial and right sided weakness. CT showed dense L-MCA sign and TPA started but she had worsening of right hemiparesis with lethargy. Post procedure developed acute respiratory failure with dense right hemiparesis with global aphasia. She was vent dependent and developed small patchy areas  of hemorrhage within acute Large L-MCA infarct as well as cerebral edema. She continued to have lethargy with decrease in LOC requiring PEG placement by Dr. Hulen Skains and Lurline Idol with BAL performed by Dr Nelda Marseille.  She was weaned to trach collar but continued to have significant secretions requiring frequent suctioning.  She was discharged to [facility] on 11/02 for medical management and rehab. She was decannulated on 11/21.  MBS done 11/14 and she was started on dysphagia 1, honey liquids on 11/21 and tube feeds ongoing as supplement. She was felt to be a good candidate for intensive rehab program and CIR was recommended for follow up therapy.  Repeat objective swallow study ordered today to determine readiness for diet progression.   No Data Recorded Assessment / Plan / Recommendation CHL IP CLINICAL IMPRESSIONS 06/06/2015 Therapy Diagnosis Mild oral phase dysphagia;Moderate pharyngeal phase dysphagia Clinical Impression Pt presents with improvements in swallowing function in comparison to initial MBS.  Pt now presents with a mild oral phase dysphagia and moderate pharyngeal phase dysphagia with both sensory and motor components.  Oral phase deficits are characterized by right sided labial and lingual weakness which results in weakened manipulation and prolonged oral transit of boluses to the oropharynx.  Decreased pharyngeal sensation and decreased base of tongue retraction also resulted in  premature spillage of materials into the pharynx and delayed swallow initiation.  Swallow response was delayed to the vallecula with thickened liquids and solids, and further delayed to the pyriforms with thin liquids.  Delayed swallow initiation resulted in deep, silent penetration of thin liquids.    Pharyngeal weakness also resulted in mild-moderate residuals remaining in the vallecula post swallow.  Vallecular residuals were noted to spill over the epiglottis into the laryngeal vestibule and resulted in high flash penetration of solids during the swallow as well as trace aspiration after the swallow during consumption of thin liquids.   Pt was noted to spontaneously use extra swallows which, in combination with SLP interventions for alternating solids and liquids effectively minimized the amount of residue in the pharynx.  Recommend diet progression to dys 2 textures and nectar thick liquids with full supervision for use of swallowing precautions.  Pt would also benefit from the water protocol to continue working towards further liquids progression.  Prognosis for advancement good with trials of advanced consistencies, SLP follow up for use of safe swallowing strategies, and repeat objective swallow study in 1-2 weeks.   Impact on safety and function Risk for inadequate nutrition/hydration;Moderate aspiration risk   No flowsheet data found.  CHL IP DIET RECOMMENDATION 06/06/2015 SLP Diet Recommendations Dysphagia 2 (Fine chop) solids;Nectar thick liquid Liquid Administration via Cup Medication Administration Other (Comment) Compensations Minimize environmental distractions;Slow rate;Small sips/bites;Multiple dry swallows after each bite/sip;Follow solids with liquid Postural Changes --   No flowsheet data found.         CHL IP ORAL PHASE 06/06/2015 Oral Phase Impaired Oral - Pudding Teaspoon -- Oral - Pudding Cup -- Oral - Honey Teaspoon -- Oral - Honey Cup Lingual/palatal residue;Decreased bolus cohesion;Weak  lingual manipulation;Reduced posterior propulsion;Delayed oral transit;Premature spillage Oral - Nectar Teaspoon -- Oral - Nectar Cup Weak lingual manipulation;Reduced posterior propulsion;Premature spillage;Delayed oral transit;Decreased bolus cohesion;Lingual/palatal residue Oral - Nectar Straw -- Oral - Thin Teaspoon -- Oral - Thin Cup Weak lingual manipulation;Reduced posterior propulsion;Premature spillage;Delayed oral transit;Decreased bolus cohesion;Lingual/palatal residue Oral - Thin Straw Premature spillage;Lingual/palatal residue;Decreased bolus cohesion Oral - Puree Weak lingual manipulation;Reduced posterior propulsion;Lingual/palatal residue;Premature spillage;Delayed oral transit Oral - Mech Soft -- Oral -  Regular Weak lingual manipulation;Reduced posterior propulsion;Lingual/palatal residue;Premature spillage Oral - Multi-Consistency -- Oral - Pill -- Oral Phase - Comment --  CHL IP PHARYNGEAL PHASE 06/06/2015 Pharyngeal Phase Impaired Pharyngeal- Pudding Teaspoon -- Pharyngeal -- Pharyngeal- Pudding Cup -- Pharyngeal -- Pharyngeal- Honey Teaspoon -- Pharyngeal -- Pharyngeal- Honey Cup Delayed swallow initiation-vallecula;Reduced tongue base retraction;Reduced laryngeal elevation;Reduced anterior laryngeal mobility;Pharyngeal residue - valleculae Pharyngeal -- Pharyngeal- Nectar Teaspoon -- Pharyngeal -- Pharyngeal- Nectar Cup Delayed swallow initiation-vallecula;Reduced tongue base retraction;Reduced anterior laryngeal mobility;Reduced laryngeal elevation;Pharyngeal residue - valleculae Pharyngeal -- Pharyngeal- Nectar Straw -- Pharyngeal -- Pharyngeal- Thin Teaspoon -- Pharyngeal -- Pharyngeal- Thin Cup Reduced airway/laryngeal closure;Reduced tongue base retraction;Reduced anterior laryngeal mobility;Reduced laryngeal elevation;Pharyngeal residue - valleculae;Pharyngeal residue - pyriform;Penetration/Aspiration during swallow;Penetration/Apiration after swallow;Delayed swallow initiation-pyriform  sinuses Pharyngeal Material enters airway, CONTACTS cords and not ejected out;Material enters airway, passes BELOW cords without attempt by patient to eject out (silent aspiration) Pharyngeal- Thin Straw Compensatory strategies attempted (with notebox);Penetration/Aspiration during swallow;Reduced airway/laryngeal closure;Reduced anterior laryngeal mobility;Reduced laryngeal elevation;Reduced tongue base retraction;Pharyngeal residue - pyriform;Pharyngeal residue - valleculae Pharyngeal Material enters airway, CONTACTS cords and then ejected out Pharyngeal- Puree Delayed swallow initiation-vallecula;Reduced tongue base retraction;Pharyngeal residue - valleculae;Reduced anterior laryngeal mobility Pharyngeal -- Pharyngeal- Mechanical Soft -- Pharyngeal -- Pharyngeal- Regular Delayed swallow initiation-vallecula;Reduced anterior laryngeal mobility;Reduced tongue base retraction;Pharyngeal residue - valleculae Pharyngeal -- Pharyngeal- Multi-consistency -- Pharyngeal -- Pharyngeal- Pill -- Pharyngeal -- Pharyngeal Comment --  No flowsheet data found. Page, Selinda Orion 06/06/2015, 12:37 PM               CBC  Recent Labs Lab 06/29/15 0705 06/30/15 0340 07/03/15 0525  WBC 8.9 8.7 8.8  HGB 8.8* 9.4* 10.8*  HCT 27.8* 30.4* 34.3*  PLT 178 272 265  MCV 88.8 89.1 90.7  MCH 28.1 27.6 28.6  MCHC 31.7 30.9 31.5  RDW 15.9* 15.7* 15.9*    Chemistries   Recent Labs Lab 06/29/15 0705 06/30/15 0340 07/01/15 0530 07/03/15 0525  NA 142 142 140 142  K 4.0 3.4* 3.9 3.5  CL 108 104 102 103  CO2 25 27 28 28   GLUCOSE 181* 164* 168* 136*  BUN 8 7 9 10   CREATININE 0.39* 0.37* 0.39* 0.47  CALCIUM 8.7* 9.1 9.3 9.2  MG 1.8 1.7  --   --   AST  --  20  --  20  ALT  --  33  --  31  ALKPHOS  --  51  --  46  BILITOT  --  0.3  --  0.5   ------------------------------------------------------------------------------------------------------------------ estimated creatinine clearance is 79.7 mL/min (by C-G formula  based on Cr of 0.47). ------------------------------------------------------------------------------------------------------------------ No results for input(s): HGBA1C in the last 72 hours. ------------------------------------------------------------------------------------------------------------------ No results for input(s): CHOL, HDL, LDLCALC, TRIG, CHOLHDL, LDLDIRECT in the last 72 hours. ------------------------------------------------------------------------------------------------------------------ No results for input(s): TSH, T4TOTAL, T3FREE, THYROIDAB in the last 72 hours.  Invalid input(s): FREET3 ------------------------------------------------------------------------------------------------------------------ No results for input(s): VITAMINB12, FOLATE, FERRITIN, TIBC, IRON, RETICCTPCT in the last 72 hours.  Coagulation profile No results for input(s): INR, PROTIME in the last 168 hours.  No results for input(s): DDIMER in the last 72 hours.  Cardiac Enzymes No results for input(s): CKMB, TROPONINI, MYOGLOBIN in the last 168 hours.  Invalid input(s): CK ------------------------------------------------------------------------------------------------------------------ Invalid input(s): POCBNP    Esker Dever D.O. on 07/05/2015 at 1:49 PM  Between 7am to 7pm - Pager - [phone]  After 7pm go to [email or web address] - password [email or web address]  And look for the night coverage person covering for  me after hours  [facility] Office  [phone]

## 2015-07-05 NOTE — Progress Notes (Signed)
Patient transferred from [facility] via hospital bed to room 3E09. Patien stable and has cuffed tracheostomy. Respiratory and ICU nurse was settled patient in the room. Suction canister present. VSS. Nurse tech and nurse notified and verified placement of telemetry monitor and leads with CMT. Currently patient complains of no pain or discomfort. Will continue to monitor patient to end of shift.

## 2015-07-05 NOTE — Progress Notes (Signed)
Talked to patient and son Kerry [name] at bedside about DCP. Kerry [name] stated the he talked to his father and brother about DCP and are deciding between home with Kerry [name] vs short term SNF. Attempted to call the spouse Kerry [name] x 2 - no answer. CM will continue to follow for DCP. Mindi Slicker [facility] [phone]

## 2015-07-05 NOTE — Progress Notes (Signed)
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 12/27 Transfer to ICU. 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 06/26/15 - Alert weaning 06/27/15 - s/p trach 06/27/2015 by DF. Following commands. RN reports prolonged urinary retention  CULTURES: 12/27 Blood >>neg 12/27 Urine >> E coli 12/27 Sputum >> MSSA 12/31 Sputum >>neg  ANTIBIOTICS: 12/27 Vancomycin >> 12/29 12/27 Fortaz >> 12/29 12/29 Ancef >> 12/31 12/31 Vancomycin >>1/2 12/31 Zosyn >>1/2 Ceftriaxone 1/2 - 1/9  SUBJECTIVE/OVERNIGHT/INTERVAL HX:  Patient denies any dyspnea. Continuing to have intermittent cough. Eating with her cuff deflated. No nausea or vomiting.  REVIEW OF SYSTEMS:  Patient denies any fever or chills. She denies any chest pain or pressure.  VITAL SIGNS: BP 154/66 mmHg  Pulse 78  Temp(Src) 97.9 F (36.6 C) (Oral)  Resp 16  Ht 5\' 6"  (1.676 m)  Wt 167 lb 1.7 oz (75.8 kg)  BMI 26.98 kg/m2  SpO2 99%  VENTILATOR SETTINGS: Vent Mode:  [-]  FiO2 (%):  [28 %] 28 %  INTAKE / OUTPUT: I/O last 3 completed shifts: In: 360 [P.O.:360] Out: -   PHYSICAL EXAMINATION: General: Awake. Sitting up in beating lunch. Neuro:  No distress. Following commands. Nods appropraitely & mouths answers. HEENT:  Tracheostomy in place with cuff deflated. Moist mucous membranes. No scleral injection. Cardiovascular:   Regular rate. No  edema. Normal S1 & S2. Lungs: Coarse breath sounds bilaterally with increased secretions while eating. Normal work of breathing on tracheostomy collar. Skin:  Warm & dry. No rash on exposed skin.  LABS: PULMONARY No results for input(s): PHART, PCO2ART, PO2ART, HCO3, TCO2, O2SAT in the last 168 hours.  Invalid input(s): PCO2, PO2  CBC  Recent Labs Lab 06/29/15 0705 06/30/15 0340 07/03/15 0525  HGB 8.8* 9.4* 10.8*  HCT 27.8* 30.4* 34.3*  WBC 8.9 8.7 8.8  PLT 178 272 265    COAGULATION No results for input(s): INR in the last 168 hours.  CARDIAC  No results for input(s): TROPONINI in the last 168 hours. No results for input(s): PROBNP in the last 168 hours.   CHEMISTRY  Recent Labs Lab 06/29/15 0705 06/30/15 0340 07/01/15 0530 07/03/15 0525  NA 142 142 140 142  K 4.0 3.4* 3.9 3.5  CL 108 104 102 103  CO2 25 27 28 28   GLUCOSE 181* 164* 168* 136*  BUN 8 7 9 10   CREATININE 0.39* 0.37* 0.39* 0.47  CALCIUM 8.7* 9.1 9.3 9.2  MG 1.8 1.7  --   --   PHOS 4.8* 4.1  --   --    Estimated Creatinine Clearance: 79.7 mL/min (by C-G formula based on Cr of 0.47).   LIVER  Recent Labs Lab 06/30/15 0340 07/03/15 0525  AST 20 20  ALT 33 31  ALKPHOS 51 46  BILITOT 0.3 0.5  PROT 6.1* 6.4*  ALBUMIN 2.7* 2.9*     INFECTIOUS No results for input(s): LATICACIDVEN, PROCALCITON in the last  168 hours.   ENDOCRINE CBG (last 3)   Recent Labs  07/04/15 1803 07/04/15 2127 07/05/15 0637  GLUCAP 138* 195* 143*    No results found.  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  She was sent to [facility] and then CIR.  She had RLL PE on 06/07/15.  She developed altered mental status and acute hypoxic/hypercapnic respiratory failure 06/19/15 and transferred back to ICU.  Was difficult intubation.  Found to have MSSA HCAP and E coli UTI.  Self extubated and reintubated 12/31 due to aspiration. Now s/p tracheostomy 1/4  by DF. 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  1. Acute Hypoxic Respiratory Failure:  Secondary to aspiration from vocal core paresis.  S/P tracheostomy 1/4 by DF.plan for tracheostomy changed today or tomorrow. 2. Left Leg DVT & Right Lower Lobe PE:  December 2016. Treatment with Xarelto.  Remainder of care per primary service.   Sonia Baller Ashok Cordia, M.D. [facility] Pager:  [phone] After 3pm or if no response, call [phone]  07/05/2015, 9:01 AM

## 2015-07-06 DIAGNOSIS — L8990 Pressure ulcer of unspecified site, unspecified stage: Secondary | ICD-10-CM

## 2015-07-06 LAB — CBC
HCT: 37.6 % (ref 36.0–46.0)
HEMOGLOBIN: 11.6 g/dL — AB (ref 12.0–15.0)
MCH: 27.9 pg (ref 26.0–34.0)
MCHC: 30.9 g/dL (ref 30.0–36.0)
MCV: 90.4 fL (ref 78.0–100.0)
PLATELETS: 268 10*3/uL (ref 150–400)
RBC: 4.16 MIL/uL (ref 3.87–5.11)
RDW: 15.4 % (ref 11.5–15.5)
WBC: 8 10*3/uL (ref 4.0–10.5)

## 2015-07-06 LAB — GLUCOSE, CAPILLARY
GLUCOSE-CAPILLARY: 142 mg/dL — AB (ref 65–99)
GLUCOSE-CAPILLARY: 193 mg/dL — AB (ref 65–99)
GLUCOSE-CAPILLARY: 169 mg/dL — AB (ref 65–99)
Glucose-Capillary: 144 mg/dL — ABNORMAL HIGH (ref 65–99)

## 2015-07-06 LAB — BASIC METABOLIC PANEL
ANION GAP: 9 (ref 5–15)
BUN: 10 mg/dL (ref 6–20)
CHLORIDE: 105 mmol/L (ref 101–111)
CO2: 28 mmol/L (ref 22–32)
Calcium: 9.5 mg/dL (ref 8.9–10.3)
Creatinine, Ser: 0.47 mg/dL (ref 0.44–1.00)
GFR calc Af Amer: >60 mL/min (ref >60)
Glucose, Bld: 149 mg/dL — ABNORMAL HIGH (ref 65–99)
POTASSIUM: 3.7 mmol/L (ref 3.5–5.1)
SODIUM: 142 mmol/L (ref 135–145)

## 2015-07-06 MED ORDER — AMLODIPINE BESYLATE 10 MG PO TABS
10.0000 mg | ORAL_TABLET | Freq: Every day | ORAL | Status: DC
  Administered 2015-07-07 – 2015-07-09 (×3): 10 mg via ORAL
  Filled 2015-07-06 (×3): qty 1

## 2015-07-06 MED ORDER — ATORVASTATIN CALCIUM 40 MG PO TABS
40.0000 mg | ORAL_TABLET | Freq: Every day | ORAL | Status: DC
  Administered 2015-07-06 – 2015-07-08 (×4): 40 mg via ORAL
  Filled 2015-07-06 (×2): qty 1

## 2015-07-06 MED ORDER — PANTOPRAZOLE SODIUM 40 MG PO PACK
40.0000 mg | PACK | ORAL | Status: DC
  Administered 2015-07-07 – 2015-07-09 (×3): 40 mg via ORAL
  Filled 2015-07-06 (×3): qty 20

## 2015-07-06 MED ORDER — LISINOPRIL 10 MG PO TABS
10.0000 mg | ORAL_TABLET | Freq: Every day | ORAL | Status: DC
  Administered 2015-07-07 – 2015-07-09 (×3): 10 mg via ORAL
  Filled 2015-07-06 (×3): qty 1

## 2015-07-06 MED ORDER — FOLIC ACID 1 MG PO TABS
1.0000 mg | ORAL_TABLET | Freq: Every day | ORAL | Status: DC
  Administered 2015-07-07 – 2015-07-09 (×3): 1 mg via ORAL
  Filled 2015-07-06 (×2): qty 1

## 2015-07-06 MED ORDER — VITAMIN B-1 100 MG PO TABS
100.0000 mg | ORAL_TABLET | Freq: Every day | ORAL | Status: DC
  Administered 2015-07-07 – 2015-07-09 (×3): 100 mg via ORAL
  Filled 2015-07-06 (×3): qty 1

## 2015-07-06 MED ORDER — CARVEDILOL 6.25 MG PO TABS
6.2500 mg | ORAL_TABLET | Freq: Two times a day (BID) | ORAL | Status: DC
  Administered 2015-07-06 – 2015-07-09 (×6): 6.25 mg via ORAL
  Filled 2015-07-06 (×4): qty 1

## 2015-07-06 NOTE — Progress Notes (Signed)
CSW spoke with pt husband regarding pt placement.  CSW explained SNF process and cost ([employer] generally only pays 80% of SNF stay resulting in $100-150/day copay).  Husband stated this was not affordable but thought he could manage pt at home- states he already has a hospital bed and some other equipment.  Pt husband is agreeable to speaking to [facility] about home health services and states he will be at the hospital by about 10am- RNCM informed  CSW signing off  Domenica Reamer, [employer] Worker [phone]

## 2015-07-06 NOTE — Progress Notes (Signed)
07/06/2015- Respiratory care note- Patient and family education for trach care initiated.  Shiley trach care booklet given to family.  Education performed to demonstrate trach care and suctioning.  Explained the importance of staying sterile during suctioning.  Encouraged family to participate with RT and nursing during trach care.  Will follow-up on education as needed prior to pt being discharged from hospital.  Family very receptive to education.  s Ailee Pates rrt,rcp

## 2015-07-06 NOTE — Progress Notes (Addendum)
[facility]                                                                              Patient Demographics  Kerry [name], is a 60 y.o. female, DOB - 1956/04/27, [id number]  Admit date - 06/19/2015   Admitting Physician Colbert Coyer, MD  Outpatient Primary MD for the patient is [name], MD  LOS - 17   No chief complaint on file.     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 She was sent to an LTAC and then CIR. She had RLL PE on 06/07/15. She developed altered mental status and acute hypoxic/hypercapnic respiratory failure 06/19/15 and was transferred back to ICU from [place]. Was difficult intubation. Found to have MSSA HCAP and E coli UTI.  Assessment & Plan   Acute hypoxic/hypercapnic respiratory failure -s/p trach 06/27/2015 - doing well 06/29/15 on ATC - ongoing trach care per PCCM - downsizing or decannulation and timing per PCCM  Probable aspiration event 12/31 - Dysphagia s/p G tube -Was previously on dysphagia 2 diet with nectar thick liquids - per SLP pt is a silent aspirator - has been cleared for a D3 diet w/ nectar liquids - stopped tube feeds and following diet intake though PEG remains in place (clamped)  Acute encephalopathy  -Appears to have returned to baseline mental status   MSSA HCAP -Has completed a total of 11 days of abx tx - no symptoms of persisting infection  E coli UTI -Has completed abx course   RLL PE - R leg DVT - mid dec 2016 -Continue Xarelto   Anemia of critical illness -Hgb stable/improving slowly  -Monitor CBC  Vocal cord paresis -As per PCCM  Recent Lt MCA CVA -Cannot return to CIR -Patient's family would like to take her home with home health services  Hx of CAD  HTN -Follow BP w/o change today   HLD -Continue statin  Bradycardia -Resolved  Hypokalemia -Resolved  Hypomagnesemia -Resolved  DM -CBG reasonably  controlled -Continue ISS and CBG monitoring  Code Status: Full  Family Communication: None at bedside  Disposition Plan: Admitted. Pending trach change by pulmonology.  Will likely be ready for discharge 07/09/2015 to home with home health services.  Time Spent in minutes   30 minutes  Procedures / significant events 12/14 CT chest > RLL PE 12/15 Doppler > DVT Rt common femoral vein  12/16 Laryngoscopy > vocal fold paresis/hypomobility 12/17 CT head > Lt MCA CVA 12/20 D/C from hospital - Admit to [place]  12/27 Readmit to hospital from [place] - transfer to ICU - intubated 12/27 CT head > ?acute Lt pontine infarct 12/27 EEG > diffuse slowing with focus over Lt posterior temporal region 12/28 MRI brain > no evidence of ischemia in pons > findings more likely wallerian degeneration  12/28 Neurology consulted - off pressors 12/30 self extubated 12/31 aspiration > reintubated 06/26/15 - Alert weaning 06/27/15 - s/p trach 06/27/2015 by DF  Consults   PCCM  DVT Prophylaxis  Xarelto  Lab Results  Component Value Date   PLT 268 07/06/2015    Medications  Scheduled Meds: . [  START ON 07/07/2015] amLODipine  10 mg Oral Daily  . antiseptic oral rinse  7 mL Mouth Rinse q12n4p  . atorvastatin  40 mg Oral q1800  . carvedilol  6.25 mg Oral BID WC  . chlorhexidine  15 mL Mouth Rinse BID  . feeding supplement (ENSURE ENLIVE)  237 mL Oral Q24H  . [START ON 123XX123 folic acid  1 mg Oral Daily  . insulin aspart  0-15 Units Subcutaneous TID WC  . [START ON 07/07/2015] lisinopril  10 mg Oral Daily  . [START ON 07/07/2015] pantoprazole sodium  40 mg Oral Q24H  . rivaroxaban  20 mg Oral Q supper  . [START ON 07/07/2015] thiamine  100 mg Oral Daily   Continuous Infusions:  PRN Meds:.acetaminophen, fentaNYL (SUBLIMAZE) injection, hydrALAZINE, ondansetron, RESOURCE THICKENUP CLEAR  Antibiotics    Anti-infectives    Start     Dose/Rate Route Frequency Ordered Stop   06/25/15 1100  cefTRIAXone  (ROCEPHIN) 1 g in dextrose 5 % 50 mL IVPB  Status:  Discontinued     1 g 100 mL/hr over 30 Minutes Intravenous Every 24 hours 06/25/15 1048 06/29/15 1825   06/23/15 1400  piperacillin-tazobactam (ZOSYN) IVPB 3.375 g  Status:  Discontinued     3.375 g 12.5 mL/hr over 240 Minutes Intravenous 3 times per day 06/23/15 0703 06/25/15 1048   06/23/15 0800  piperacillin-tazobactam (ZOSYN) IVPB 3.375 g     3.375 g 100 mL/hr over 30 Minutes Intravenous STAT 06/23/15 0703 06/23/15 0831   06/23/15 0800  vancomycin (VANCOCIN) IVPB 1000 mg/200 mL premix  Status:  Discontinued     1,000 mg 200 mL/hr over 60 Minutes Intravenous Every 8 hours 06/23/15 0703 06/25/15 1048   06/21/15 1400  ceFAZolin (ANCEF) IVPB 1 g/50 mL premix  Status:  Discontinued     1 g 100 mL/hr over 30 Minutes Intravenous 3 times per day 06/21/15 1111 06/23/15 0651   06/21/15 1200  levofloxacin (LEVAQUIN) IVPB 750 mg  Status:  Discontinued     750 mg 100 mL/hr over 90 Minutes Intravenous Every 24 hours 06/21/15 0854 06/21/15 1111   06/19/15 2200  vancomycin (VANCOCIN) IVPB 1000 mg/200 mL premix  Status:  Discontinued     1,000 mg 200 mL/hr over 60 Minutes Intravenous Every 12 hours 06/19/15 0921 06/21/15 0854   06/19/15 1000  cefTAZidime (FORTAZ) 1 g in dextrose 5 % 50 mL IVPB  Status:  Discontinued     1 g 100 mL/hr over 30 Minutes Intravenous Every 8 hours 06/19/15 0921 06/21/15 0854   06/19/15 1000  vancomycin (VANCOCIN) 1,500 mg in sodium chloride 0.9 % 500 mL IVPB     1,500 mg 250 mL/hr over 120 Minutes Intravenous  Once 06/19/15 [id number] 06/19/15 1521      Subjective:   Kerry [name] seen and examined today.  Limited communication.  Denies pain, chest pain, shortness of breath, abdominal pain, N/V.   Objective:   Filed Vitals:   07/06/15 0440 07/06/15 0718 07/06/15 0810 07/06/15 1109  BP:  124/71    Pulse: 67 70 72 75  Temp:  98.1 F (36.7 C)    TempSrc:  Oral    Resp: 16 17 18 18   Height:      Weight:  75.479  kg (166 lb 6.4 oz)    SpO2: 97% 98% 97% 98%    Wt Readings from Last 3 Encounters:  07/06/15 75.479 kg (166 lb 6.4 oz)  06/14/15 76.5 kg (168 lb 10.4 oz)  06/08/15 80.1  kg (176 lb 9.4 oz)     Intake/Output Summary (Last 24 hours) at 07/06/15 1206 Last data filed at 07/06/15 [id number]  Gross per 24 hour  Intake    270 ml  Output      0 ml  Net    270 ml    Exam  General: Well developed, NAD  HEENT: NCAT,  mucous membranes moist.   Neck: Trach collar   Cardiovascular: S1 S2 auscultated,RRR, no murmurs  Respiratory: Clear but  Diminished (anteriorly)   Abdomen: Soft, nontender, nondistended, + bowel sounds. peg  Extremities: warm dry without cyanosis clubbing or edema  Data Review   Micro Results No results found for this or any previous visit (from the past 240 hour(s)).  Radiology Reports Dg Chest 2 View  06/16/2015  CLINICAL DATA:  Breathing difficulty. EXAM: CHEST  2 VIEW COMPARISON:  06/05/2015 FINDINGS: Moderate cardiac enlargement. There is no pleural effusion or edema. The lungs appear clear. Spondylosis noted within the thoracic spine. IMPRESSION: 1. Cardiac enlargement. Electronically Signed   By: Kerby Moors M.D.   On: 06/16/2015 17:35   Ct Head Wo Contrast  06/19/2015  CLINICAL DATA:  Altered mental status.  Unresponsive.  Hypoxic. EXAM: CT HEAD WITHOUT CONTRAST TECHNIQUE: Contiguous axial images were obtained from the base of the skull through the vertex without intravenous contrast. COMPARISON:  06/08/2015 FINDINGS: There is no evidence of mass effect, midline shift or extra-axial fluid collections. There is no evidence of a space-occupying lesion or intracranial hemorrhage. There is no evidence of a cortical-based area of acute infarction. There is a small low-attenuation area in the left pons concerning for an acute left pontine infarct. There is a large chronic left MCA territory infarct. The ventricles and sulci are appropriate for the patient's age. The  basal cisterns are patent. Visualized portions of the orbits are unremarkable. The visualized portions of the paranasal sinuses and mastoid air cells are unremarkable. The osseous structures are unremarkable. IMPRESSION: 1. Small low-attenuation area in the left pons concerning for an acute left pontine infarct. 2. Old left MCA territory infarct. Electronically Signed   By: Kathreen Devoid   On: 06/19/2015 13:32   Ct Head Wo Contrast  06/09/2015  CLINICAL DATA:  Initial evaluation for deteriorating mental status. EXAM: CT HEAD WITHOUT CONTRAST TECHNIQUE: Contiguous axial images were obtained from the base of the skull through the vertex without intravenous contrast. COMPARISON:  Prior study from 04/16/2015. FINDINGS: There has been continued interval evolution of large volume left MCA territory infarct, overall stable in size and distribution as compared to previous study. There is increased volume loss and encephalomalacia within the area of infarction. No evidence for associated hemorrhage. Previously seen midline shift has essentially resolved. No new acute large vessel territory infarct. No intracranial hemorrhage. No mass lesion. No hydrocephalus. Basilar cisterns are patent. No extra-axial fluid collection. Mild chronic microvascular ischemic disease noted within the periventricular white matter. Scattered atheromatous plaque within the carotid siphons. Scalp soft tissues within normal limits. No acute abnormality about the orbits. Paranasal sinuses are clear.  No mastoid effusion. Calvarium intact. IMPRESSION: 1. Normal expected interval evolutional change of large left MCA territory infarct. Associated edema has largely resolved. 2. No new intracranial process. Electronically Signed   By: Jeannine Boga M.D.   On: 06/09/2015 00:47   Ct Angio Chest Pe W/cm &/or Wo Cm  06/07/2015  CLINICAL DATA:  Tachycardia. EKG with sinus tachycardia and T-wave abnormality. EXAM: CT ANGIOGRAPHY CHEST WITH CONTRAST  TECHNIQUE:  Multidetector CT imaging of the chest was performed using the standard protocol during bolus administration of intravenous contrast. Multiplanar CT image reconstructions and MIPs were obtained to evaluate the vascular anatomy. CONTRAST:  80 mL OMNIPAQUE IOHEXOL 350 MG/ML SOLN COMPARISON:  04/20/2015 FINDINGS: Technically adequate study with good opacification of the central and segmental pulmonary arteries. Filling defects are demonstrated in the right lower lobe posterior subsegmental branch vessels consistent with pulmonary embolus. This may be acute or chronic. No large central pulmonary emboli. Possible filling defect in the left atrial appendage. Cardiac enlargement with evidence of left ventricular hypertrophy. R 8 RV ratio is less than 1 suggesting no evidence of right heart strain. Small pericardial effusion or thickening. Esophagus is decompressed. No significant lymphadenopathy in the chest. Evaluation of lungs is limited due to respiratory motion artifact. Probable atelectasis in the lung bases. Emphysematous changes in the upper lungs. No pleural effusions. No pneumothorax. Included portions of the upper abdominal organs demonstrate calcified granulomas in the spleen. Degenerative changes in the spine. Review of the MIP images confirms the above findings. IMPRESSION: Subsegmental right lower lobe pulmonary emboli. No evidence of right heart strain. Possible filling defect in the left atrial appendage. Cardiac enlargement with left ventricular hypertrophy. No evidence of active pulmonary disease. These results were called by telephone at the time of interpretation on 06/07/2015 at 12:44 am to [facility], the patient's nurse on [facility], who verbally acknowledged these results. Electronically Signed   By: Lucienne Capers M.D.   On: 06/07/2015 00:47   Mr Brain Wo Contrast  06/21/2015  CLINICAL DATA:  Follow-up new pontine stroke. History of LEFT-sided stroke. EXAM: MRI HEAD WITHOUT CONTRAST  TECHNIQUE: Multiplanar, multiecho pulse sequences of the brain and surrounding structures were obtained without intravenous contrast. COMPARISON:  CT head June 19, 2015 FINDINGS: Patchy T2 bright signal within the LEFT midbrain and pons corresponding to CT abnormality without superimposed reduced diffusion to suggest acute ischemia. Extensive reduced diffusion LEFT frontotemporal parietal lobes, predominately with T2 shine through. Patchy areas of low ADC value, however there is underlying susceptibility artifact and, this is likely spurious. LEFT basal ganglia and LEFT thalamus infarcts. Mild ex vacuo dilatation LEFT lateral ventricle. No hydrocephalus. No midline shift or mass effect. No mass lesions. No abnormal extra-axial fluid collection. Loss of LEFT internal carotid artery flow void from the skullbase. Probable retrograde flow as there is LEFT MCA and bilateral anterior cerebral artery flow voids. Ocular globes and orbital contents are normal. Small paranasal sinus air-fluid levels. Life-support lines in place. Moderate bilateral mastoid effusions. No abnormal sellar expansion. No cerebellar tonsillar ectopia. No suspicious calvarial bone marrow signal. IMPRESSION: No evidence of acute ischemia with particular attention to the pons. Old LEFT hemorrhagic MCA territory infarct. Chronically occluded LEFT internal carotid artery. T2 bright signal within the LEFT pons and midbrain, favoring early wallerian degeneration, less likely old ischemia. Electronically Signed   By: Elon Alas M.D.   On: 06/21/2015 04:07   Dg Chest Port 1 View  06/27/2015  CLINICAL DATA:  Acute respiratory failure.  Tracheostomy. EXAM: PORTABLE CHEST 1 VIEW COMPARISON:  06/26/2015 FINDINGS: Endotracheal tube has been rib moved. A tracheostomy tube has been placed and projects over the airway, terminating at the inferior margin of the clavicular heads. The cardiac silhouette remains mildly enlarged. Right basilar airspace  opacity has increased. Mild left basilar opacity does not appear significantly changed. No sizable pleural effusion or pneumothorax is identified. IMPRESSION: Increased right basilar airspace disease. Electronically Signed   By: Zenia Resides  Jeralyn Ruths M.D.   On: 06/27/2015 15:06   Dg Chest Port 1 View  06/26/2015  CLINICAL DATA:  Respiratory failure. Subsequent encounter. History of hypertension and diabetes. EXAM: PORTABLE CHEST 1 VIEW COMPARISON:  06/26/2015 at 6:11 a.m. FINDINGS: Interstitial opacities and patchy lung base airspace opacities have improved consistent with improved pulmonary edema. No new lung opacities. No convincing pleural effusion and no pneumothorax. Endotracheal tube now projects 1.7 cm above the carina. IMPRESSION: Significantly improved lung aeration since the earlier study consistent with improved pulmonary edema. No new abnormalities. Electronically Signed   By: Lajean Manes M.D.   On: 06/26/2015 12:21   Dg Chest Port 1 View  06/26/2015  CLINICAL DATA:  Hypoxia. EXAM: PORTABLE CHEST 1 VIEW COMPARISON:  June 25, 2015 FINDINGS: Endotracheal tube tip is 1.2 cm above the carina. No pneumothorax. There is interstitial edema in the lung base regions. There is no airspace consolidation. There is cardiomegaly. The pulmonary vascularity is normal. No adenopathy. No bone lesions. IMPRESSION: Bibasilar interstitial edema with mild cardiac enlargement. Suspect a degree of congestive heart failure. Currently there is no airspace consolidation appreciable. Endotracheal tube tip is 1.2 cm above the carina. No pneumothorax. It may be prudent to consider withdrawing endotracheal tube 2 to 3 cm. Electronically Signed   By: Lowella Grip III M.D.   On: 06/26/2015 07:53   Dg Chest Port 1 View  06/25/2015  CLINICAL DATA:  Respiratory distress. EXAM: PORTABLE CHEST 1 VIEW COMPARISON:  Earlier today at 712 hours FINDINGS: 1611 hours Endotracheal tube terminates 2.0 cm above carina.Patient rotated right.  Cardiomegaly accentuated by AP portable technique. No pleural effusion or pneumothorax. Low lung volumes with resultant pulmonary interstitial prominence. Persistent right base airspace disease. Clear left lung. IMPRESSION: No change in right base airspace disease. Cardiomegaly and low lung volumes, without congestive failure. Electronically Signed   By: Abigail Miyamoto M.D.   On: 06/25/2015 17:05   Dg Chest Port 1 View  06/25/2015  CLINICAL DATA:  Respiratory failure. EXAM: PORTABLE CHEST 1 VIEW COMPARISON:  06/24/2015 and 06/23/2015 FINDINGS: Endotracheal tube in good position. Further clearing of the infiltrate in the right lung. Heart size and vascularity are now normal. Left lung is clear. No effusions. IMPRESSION: Further clearing of the infiltrate in the right lung with slight residual infiltrate at the base. Electronically Signed   By: Lorriane Shire M.D.   On: 06/25/2015 09:22   Dg Chest Port 1 View  06/24/2015  CLINICAL DATA:  Healthcare associated pneumonia. EXAM: PORTABLE CHEST 1 VIEW COMPARISON:  06/23/2015 FINDINGS: Go endotracheal tube is in place 2.6 cm above the carina. Heart size is upper limits normal. Patchy infiltrate is identified throughout the right lung and at the left lung base, slightly improved compared prior study. IMPRESSION: Persistent bilateral infiltrate showing slight interval improvement in aeration. Electronically Signed   By: Nolon Nations M.D.   On: 06/24/2015 08:58   Dg Chest Port 1 View  06/23/2015  CLINICAL DATA:  Endotracheal tube placement.  Initial encounter. EXAM: PORTABLE CHEST 1 VIEW COMPARISON:  Chest radiograph performed 06/22/2015 FINDINGS: The patient's endotracheal tube is seen ending 2-3 cm above the carina. New diffuse right-sided airspace opacification raises concern for pneumonia. No definite pleural effusion or pneumothorax is seen. Minimal left basilar airspace opacity is also seen. No pneumothorax identified The cardiomediastinal silhouette is  borderline enlarged. No acute osseous abnormalities are seen. IMPRESSION: 1. Endotracheal tube seen ending 2-3 cm above the carina. 2. Diffuse new right-sided airspace opacification raises concern  for pneumonia. 3. Borderline cardiomegaly. Electronically Signed   By: Garald Balding M.D.   On: 06/23/2015 02:33   Dg Chest Port 1 View  06/22/2015  CLINICAL DATA:  60 year old female with Healthcare associated pneumonia. Subsegmental pulmonary emboli earlier this month. Initial encounter. EXAM: PORTABLE CHEST 1 VIEW COMPARISON:  06/21/2015 and earlier. FINDINGS: Portable AP semi upright view at 0617 hours. Stable endotracheal tube. Stable visualized enteric tube. Stable cardiac size and mediastinal contours. Lung volumes are within normal limits. Allowing for portable technique, the lungs are clear. No pneumothorax or pleural effusion. IMPRESSION: 1.  Stable lines and tubes. 2.  No acute cardiopulmonary abnormality. Electronically Signed   By: Genevie Ann M.D.   On: 06/22/2015 07:57   Dg Chest Port 1 View  06/21/2015  CLINICAL DATA:  Respiratory failure.  Followup exam. EXAM: PORTABLE CHEST 1 VIEW COMPARISON:  06/20/2015 FINDINGS: Endotracheal tube tip projects 2.9 cm above the carina, stable and well-positioned. Orogastric tube passes below the diaphragm into the stomach also stable. Cardiac silhouette there is normal in size and configuration. No mediastinal or hilar masses. There is persistent medial lung base opacity. This may reflect atelectasis or pneumonia, with atelectasis favored. No pulmonary edema. No evidence of pneumothorax. IMPRESSION: 1. Persistent medial lung base opacity most likely due to atelectasis. Pneumonia is possible. 2. Support apparatus well positioned. 3. No significant change from previous day's study allowing for differences in patient positioning and technique. Electronically Signed   By: Lajean Manes M.D.   On: 06/21/2015 08:16   Dg Chest Port 1 View  06/20/2015  CLINICAL DATA:   Hypoxia EXAM: PORTABLE CHEST 1 VIEW COMPARISON:  June 19, 2015 FINDINGS: Endotracheal tube tip is 3.8 cm above the carina. Nasogastric tube tip and side port are in the distal stomach region. No apparent pneumothorax. There is a small area of infiltrate in the right base. Lungs elsewhere clear. Heart is upper normal in size with pulmonary vascularity within normal limits. No adenopathy. IMPRESSION: Tube positions as described without pneumothorax. Small area of infiltrate right base. Lungs elsewhere clear. No change in cardiac silhouette. Electronically Signed   By: Lowella Grip III M.D.   On: 06/20/2015 07:30   Dg Chest Port 1 View  06/19/2015  CLINICAL DATA:  59 year old female status post adjustment of the endotracheal tube. EXAM: PORTABLE CHEST 1 VIEW COMPARISON:  Earlier chest radiograph 06/19/2015 FINDINGS: There has been interval retraction of the endotracheal tube with tip female approximately 5 cm above the carina. There has been interval removal of the enteric tube. No significant interval change in the appearance of the lungs. Pleural effusion or pneumothorax. Stable cardiac silhouette. The osseous structures appear unremarkable. IMPRESSION: Interval retraction of the endotracheal tube with tip now approximately 5 cm above the carina. Electronically Signed   By: Anner Crete M.D.   On: 06/19/2015 02:45   Dg Chest Port 1 View  06/19/2015  CLINICAL DATA:  Endotracheal tube repositioning.  Initial encounter. EXAM: PORTABLE CHEST 1 VIEW COMPARISON:  Chest radiograph performed earlier today at 1:23 a.m. FINDINGS: The patient's endotracheal tube is seen ending 1-2 cm above the carina. This could be retracted 1-2 cm, as deemed clinically appropriate. The enteric tube is seen extending below the diaphragm. Right basilar airspace opacity appears improved, and may reflect atelectasis or possibly mild pneumonia. No definite pleural effusion or pneumothorax is seen. The cardiomediastinal silhouette  is normal in size. No acute osseous abnormalities are identified. External pacing pads are noted. IMPRESSION: 1. Endotracheal tube seen  ending 1-2 cm above the carina. This could be retracted 1-2 cm, as deemed clinically appropriate. 2. Right basilar airspace opacity appears improved, and may reflect atelectasis or possibly mild pneumonia. These results were called by telephone at the time of interpretation on 06/19/2015 at 1:58 am to [facility] on [facility], who verbally acknowledged these results. Electronically Signed   By: Garald Balding M.D.   On: 06/19/2015 02:04   Dg Chest Port 1 View  06/19/2015  CLINICAL DATA:  Endotracheal tube placement.  Initial encounter. EXAM: PORTABLE CHEST 1 VIEW COMPARISON:  None. FINDINGS: The patient's endotracheal tube is seen ending just above the carina. This should be retracted 2-3 cm. The enteric tube is noted extending below the diaphragm. The lungs are well-aerated. Right basilar airspace opacity may reflect atelectasis or pneumonia. A small right pleural effusion is suspected. No pneumothorax is seen. The cardiomediastinal silhouette is within normal limits. No acute osseous abnormalities are seen. External pacing pads are noted. IMPRESSION: 1. Endotracheal tube seen ending just above the carina. This should be retracted 3 cm. 2. Right basilar airspace opacity may reflect atelectasis or pneumonia. Small right pleural effusion suspected. These results were called by telephone at the time of interpretation on 06/19/2015 at 1:58 am to [facility] on [facility], who verbally acknowledged these results. Electronically Signed   By: Garald Balding M.D.   On: 06/19/2015 02:02   Dg Chest Port 1 View  06/19/2015  CLINICAL DATA:  60 year old female with shortness of breath EXAM: PORTABLE CHEST 1 VIEW COMPARISON:  Radiograph dated 06/18/2015 FINDINGS: The patient is tilted to the right. There is stable cardiomegaly. Bibasilar linear atelectatic changes/scarring noted. There is no focal  consolidation, pleural effusion, or pneumothorax. There is degenerative changes of the spine. No acute fracture. IMPRESSION: Stable cardiomegaly.  No acute cardiopulmonary process. Electronically Signed   By: Anner Crete M.D.   On: 06/19/2015 00:41   Dg Chest Port 1 View  06/18/2015  CLINICAL DATA:  Acute onset of shortness of breath on exertion. Initial encounter. EXAM: PORTABLE CHEST 1 VIEW COMPARISON:  Chest radiograph performed 06/16/2015 FINDINGS: The lungs are well-aerated. Mild bibasilar atelectasis is noted. There is no evidence of pleural effusion or pneumothorax. The cardiomediastinal silhouette is enlarged. No acute osseous abnormalities are seen. IMPRESSION: Mild bibasilar atelectasis noted.  Cardiomegaly noted. Electronically Signed   By: Garald Balding M.D.   On: 06/18/2015 04:44   Dg Abd Portable 1v  06/19/2015  CLINICAL DATA:  Evaluate orogastric tube placement EXAM: PORTABLE ABDOMEN - 1 VIEW COMPARISON:  06/19/15, 06/04/15 FINDINGS: OG tube projects over the stomach with tip over the anticipated position of the antrum. PEG tube also projects over the stomach. Oral contrast noted throughout the large bowel. 20mm radiodensity projects over the left abdomen over a loop of large bowel and is not seen on 06/04/15. IMPRESSION: OG tube as described. Abnormal density could represent foreign body either within the abdominal cavity or on the body wall, as well as possibly ingested material. Electronically Signed   By: Skipper Cliche M.D.   On: 06/19/2015 11:18   Dg Abd Portable 1v  06/19/2015  CLINICAL DATA:  Orogastric tube placement.  Initial encounter. EXAM: PORTABLE ABDOMEN - 1 VIEW COMPARISON:  Abdominal radiograph performed 06/04/2015 FINDINGS: The patient's enteric tube is noted ending overlying the body of the stomach. The endotracheal tube is noted ending just above the carina. This should be retracted 2-3 cm. The visualized bowel gas pattern is difficult to fully assess due to  motion artifact, though contrast is seen within the colon. Right basilar airspace opacity is noted. No pleural effusion is seen. No acute osseous abnormalities are identified. External pacing pads are noted. IMPRESSION: 1. Enteric tube noted ending overlying the body of the stomach. 2. Endotracheal tube seen ending just above the carina. This should be retracted 2-3 cm. 3. Right basilar airspace opacity noted. These results were called by telephone at the time of interpretation on 06/19/2015 at 1:58 am to [facility], who verbally acknowledged these results. Electronically Signed   By: Garald Balding M.D.   On: 06/19/2015 02:00   Dg Swallowing Func-speech Pathology  07/02/2015  Objective Swallowing Evaluation:   Patient Details Name: Kerry [name] MRN: [id number] Date of Birth: 1956-04-13 Today's Date: 07/02/2015 Time: SLP Start Time (ACUTE ONLY): 1040-SLP Stop Time (ACUTE ONLY): 1100 SLP Time Calculation (min) (ACUTE ONLY): 20 min Past Medical History: @PMH @ Past Surgical History: Past Surgical History Procedure Laterality Date . Cardiac surgery   . Appendectomy   . Breast surgery   . Mastectomy Left 03/2007 . Cardiac catheterization  2005 . Vein surgery Right 2005 . Tubal ligation Right  . Radiology with anesthesia N/A 04/11/2015   Procedure: RADIOLOGY WITH ANESTHESIA;  Surgeon: Luanne Bras, MD;  Location: [place];  Service: Radiology;  Laterality: N/A; . Cardiac catheterization N/A 04/13/2015   Procedure: Temporary Pacemaker;  Surgeon: Peter M Martinique, MD;  Location: [place] CV LAB;  Service: Cardiovascular;  Laterality: N/A; . Peg placement N/A 04/18/2015   Procedure: PERCUTANEOUS ENDOSCOPIC GASTROSTOMY (PEG) PLACEMENT;  Surgeon: Judeth Horn, MD;  Location: [place];  Service: General;  Laterality: N/A;  bedside . 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 She was sent to an LTAC and then CIR. She had PEG and trach 10/26 (has since been decannulated). She had RLL PE on 06/07/15. She developed altered mental status and acute hypoxic/hypercapnic respiratory failure 06/19/15 and was transferred back to ICU from [place]. CT of the head with ? acute left pontine infarct. Was difficult intubation. s/p tracheostomy 1/4. CCM notes vocal cord paresis/hypomobility per laryngoscopy 12/16. Found to have MSSA HCAP and E coli UTI. Subjective: pt alert, aphasic, inspiratory stridor Assessment / Plan / Recommendation CHL IP CLINICAL IMPRESSIONS 07/02/2015 Therapy Diagnosis Moderate pharyngeal phase dysphagia Clinical Impression Pt presents with a primary sensory based oropharygneal dysphagia. Pt tested with and without PMSV (see treatment note), no change in function. With liquid boluses pt has an intermittent delay in swallow initiation. With thin liquids this delay occurs with the initial sips resulting in silent aspiration before the swallow; pt eventually responds with a cough when aspirate fully descends trachea. Pt consistently protected airway consuming nectar, but did have some delay with an independent second swallow to fully clear trace lingual residual. Pts function with nectar was adequate even with large consecutive straw sips. Also trialed a chin tuck with thin liquids with great success, no penetration with consecutive straw sips; can be trialed in therapy. Recommend Dys 3 (mechanical soft) diet with nectar thick liquids will follow for tolerance.  Impact on safety and function Mild aspiration risk   CHL IP TREATMENT RECOMMENDATION 07/02/2015 Treatment Recommendations Therapy as outlined in treatment plan below   Prognosis 07/02/2015 Prognosis for Safe Diet Advancement Good Barriers to Reach Goals Language deficits Barriers/Prognosis Comment --  CHL IP DIET RECOMMENDATION 07/02/2015 SLP Diet  Recommendations Dysphagia 3 (Mech soft) solids;Nectar thick liquid Liquid Administration via Cup;Straw Medication Administration Whole meds with puree Compensations Slow rate;Minimize environmental distractions Postural Changes Seated upright at 90 degrees   CHL IP OTHER RECOMMENDATIONS 07/02/2015 Recommended Consults -- Oral Care Recommendations Oral care BID Other Recommendations Order thickener from pharmacy   CHL IP FOLLOW UP RECOMMENDATIONS 07/02/2015 Follow up Recommendations Inpatient Rehab   CHL IP FREQUENCY AND DURATION 07/02/2015 Speech Therapy Frequency (ACUTE ONLY) min 2x/week Treatment Duration 2 weeks      CHL IP ORAL PHASE 07/02/2015 Oral Phase WFL Oral - Pudding Teaspoon -- Oral - Pudding Cup -- Oral - Honey Teaspoon -- Oral - Honey Cup NT Oral - Nectar Teaspoon -- Oral - Nectar Cup WFL Oral - Nectar Straw -- Oral - Thin Teaspoon -- Oral - Thin Cup WFL Oral - Thin Straw WFL Oral - Puree WFL Oral - Mech Soft -- Oral - Regular WFL Oral - Multi-Consistency -- Oral - Pill -- Oral Phase - Comment --  CHL IP PHARYNGEAL PHASE 07/02/2015 Pharyngeal Phase Impaired Pharyngeal- Pudding Teaspoon -- Pharyngeal -- Pharyngeal- Pudding Cup -- Pharyngeal -- Pharyngeal- Honey Teaspoon -- Pharyngeal -- Pharyngeal- Honey Cup NT Pharyngeal -- Pharyngeal- Nectar Teaspoon -- Pharyngeal -- Pharyngeal- Nectar Cup Delayed swallow initiation-pyriform sinuses Pharyngeal Material does not enter airway Pharyngeal- Nectar Straw Delayed swallow initiation-pyriform sinuses Pharyngeal -- Pharyngeal- Thin Teaspoon -- Pharyngeal -- Pharyngeal- Thin Cup Delayed swallow initiation-pyriform sinuses;Penetration/Aspiration before swallow Pharyngeal Material enters airway, passes BELOW cords without attempt by patient to eject out (silent aspiration) Pharyngeal- Thin Straw WFL;Compensatory strategies attempted (with notebox) Pharyngeal Material does not enter airway Pharyngeal- Puree WFL Pharyngeal -- Pharyngeal- Mechanical Soft -- Pharyngeal --  Pharyngeal- Regular WFL Pharyngeal -- Pharyngeal- Multi-consistency -- Pharyngeal -- Pharyngeal- Pill WFL Pharyngeal -- Pharyngeal Comment --  CHL IP CERVICAL ESOPHAGEAL PHASE 07/02/2015 Cervical Esophageal Phase WFL Pudding Teaspoon -- Pudding Cup -- Honey Teaspoon -- Honey Cup -- Nectar Teaspoon -- Nectar Cup -- Nectar Straw -- Thin Teaspoon -- Thin Cup -- Thin Straw -- Puree -- Mechanical Soft -- Regular -- Multi-consistency -- Pill -- Cervical Esophageal Comment -- Herbie Baltimore, MA CCC-SLP [phone] Lynann Beaver 07/02/2015, 11:26 AM              Dg Swallowing Func-speech Pathology  06/15/2015  Objective Swallowing Evaluation:   Patient Details Name: Kerry [name] MRN: [id number] Date of Birth: Jul 30, 1955 Today's Date: 06/15/2015 Time: SLP Start Time: 0855-SLP Stop Time : 0922 SLP Time Calculation (min) : 27 min Past Medical History: Past Medical History Diagnosis Date . Hypertension  . Coronary artery disease  . Diabetes mellitus  . Breast cancer ([place]) 03/10/2007   Left breat . Degenerative disc disease, cervical  . Degenerative disc disease, lumbar  . Fibromyalgia  . Hyperlipidemia  . Carpal tunnel syndrome on right  . PE (pulmonary embolism)  . UTI (lower urinary tract infection)  . GIB (gastrointestinal bleeding)  . Elevated troponin  . HTN (hypertension) 06/13/2015 Past Surgical History: Past Surgical History Procedure Laterality Date . Cardiac surgery   . Appendectomy   . Breast surgery   . Mastectomy Left 03/2007 . Cardiac catheterization  2005 . Vein surgery Right 2005 . Tubal ligation Right  . Radiology with anesthesia N/A 04/11/2015   Procedure: RADIOLOGY WITH ANESTHESIA;  Surgeon: Luanne Bras, MD;  Location: [place];  Service: Radiology;  Laterality: N/A; . Cardiac catheterization N/A 04/13/2015   Procedure: Temporary Pacemaker;  Surgeon:  Peter M Martinique, MD;  Location: [place] CV LAB;  Service: Cardiovascular;  Laterality: N/A; . Peg placement N/A 04/18/2015   Procedure:  PERCUTANEOUS ENDOSCOPIC GASTROSTOMY (PEG) PLACEMENT;  Surgeon: Judeth Horn, MD;  Location: [place];  Service: General;  Laterality: N/A;  bedside . Esophagogastroduodenoscopy N/A 04/18/2015   Procedure: ESOPHAGOGASTRODUODENOSCOPY (EGD);  Surgeon: Judeth Horn, MD;  [facility];  Service: General;  Laterality: N/A; HPI: pt presents with dense L MCA Infarct involving L Temporal, Frontoparietal, and L Basal Ganglia regions and was started on TPA, but unable to complete full dose due to hypertension, but did recieve IR intervention. Pt extubated 10/30. pt with hx of DM, HTN, Breast CA s/p Mastectomy, and Fibromyalgia. Patient transferred to [facility] and then Inpatient Rehab. Transferred back to acute on 06/07/15 due to PE. Readmitted to CIR on 06/13/2015 for completion of family education prior to discharge home.  Assessment / Plan / Recommendation CHL IP CLINICAL IMPRESSIONS 06/15/2015 Therapy Diagnosis Mild oral phase dysphagia;Moderate pharyngeal phase dysphagia Clinical Impression Pt presents with slight decline in swallowing function in comparison to previous MBS, suspect due to deconditioning s/p acute treatment for PE.  Overall pt remains with a mild oral phase dysphagia and moderate pharyngeal phase dysphagia with both sensory and motor components.  Oral phase deficits again are characterized by right sided labial and lingual weakness which results in weakened manipulation and prolonged oral transit of boluses to the oropharynx.  Decreased pharyngeal sensation and decreased base of tongue retraction also resulted in premature spillage of materials into the pharynx and delayed swallow initiation.  Swallow response was delayed to the vallecula with thickened liquids and solids, and further delayed to the pyriforms with thin liquids.  Due to delayed swallow initiation, pt exhibited deep, silent penetration of nectar thick liquids on initial cup sip and again when challenged with  larger boluses; however, smaller boluses were tolerated without airway compromise.  Post swallow, moderate vallecular residuals were noted with thickened liquids and solids and mild-moderate pyriform residue with thin liquids due to pharyngeal weakness.  Consistent deep penetration during the swallow and eventual unsensed aspiration of residuals after the swallow was visualized with thin liquids, even when boluses were strictly controlled via teaspoon.  Suspect increase in frequency and severity of airway compromise in comparison to previous study to be related to increased respiratory rate and effort required for breathing.  Pt continues to spontaneously use extra swallows which, in combination with SLP interventions for alternating solids and liquids effectively minimized pharyngeal residue.  Recommend that pt remain on dys 2 textures and nectar thick liquids with full supervision for use of swallowing precautions.  Do not recommend continuing with water protocol at this time due to pt's likely diminished functional reserve.  Prognosis for advancement fair with trials of advanced consistencies, SLP follow up for use of safe swallowing strategies, and repeat objective swallow study.   Impact on safety and function Moderate aspiration risk   CHL IP TREATMENT RECOMMENDATION 06/12/2015 Treatment Recommendations Therapy as outlined in treatment plan below   Prognosis 06/15/2015 Prognosis for Safe Diet Advancement Fair Barriers to Reach Goals Cognitive deficits;Language deficits;Motivation Barriers/Prognosis Comment -- CHL IP DIET RECOMMENDATION 06/15/2015 SLP Diet Recommendations Dysphagia 2 (Fine chop) solids;Nectar thick liquid Liquid Administration via Cup Medication Administration Other (Comment) Compensations Slow rate;Small sips/bites;Minimize environmental distractions;Multiple dry swallows after each bite/sip;Follow solids with liquid Postural Changes Seated upright at 90 degrees            CHL IP ORAL PHASE  06/15/2015 Oral Phase Impaired Oral - Pudding Teaspoon -- Oral - Pudding Cup -- Oral - Honey Teaspoon -- Oral - Honey Cup -- Oral - Nectar Teaspoon -- Oral - Nectar Cup Reduced posterior propulsion;Lingual/palatal residue;Piecemeal swallowing;Decreased bolus cohesion;Delayed oral transit;Premature spillage Oral - Nectar Straw -- Oral - Thin Teaspoon -- Oral - Thin Cup Weak lingual manipulation;Reduced posterior propulsion;Lingual/palatal residue;Piecemeal swallowing;Delayed oral transit;Decreased bolus cohesion;Premature spillage Oral - Thin Straw -- Oral - Puree Weak lingual manipulation;Reduced posterior propulsion;Lingual/palatal residue;Premature spillage;Delayed oral transit Oral - Mech Soft -- Oral - Regular Weak lingual manipulation;Reduced posterior propulsion;Lingual/palatal residue;Piecemeal swallowing;Impaired mastication;Delayed oral transit;Decreased bolus cohesion;Premature spillage Oral - Multi-Consistency -- Oral - Pill -- Oral Phase - Comment --  CHL IP PHARYNGEAL PHASE 06/15/2015 Pharyngeal Phase -- Pharyngeal- Pudding Teaspoon -- Pharyngeal -- Pharyngeal- Pudding Cup -- Pharyngeal -- Pharyngeal- Honey Teaspoon -- Pharyngeal -- Pharyngeal- Honey Cup -- Pharyngeal -- Pharyngeal- Nectar Teaspoon -- Pharyngeal -- Pharyngeal- Nectar Cup Delayed swallow initiation-vallecula;Delayed swallow initiation-pyriform sinuses;Reduced laryngeal elevation;Reduced airway/laryngeal closure;Reduced tongue base retraction;Penetration/Aspiration during swallow;Pharyngeal residue - valleculae Pharyngeal Material enters airway, CONTACTS cords and not ejected out Pharyngeal- Nectar Straw -- Pharyngeal -- Pharyngeal- Thin Teaspoon -- Pharyngeal -- Pharyngeal- Thin Cup Reduced airway/laryngeal closure;Reduced laryngeal elevation;Delayed swallow initiation-pyriform sinuses;Reduced tongue base retraction;Penetration/Aspiration during swallow;Penetration/Apiration after swallow;Pharyngeal residue - pyriform;Pharyngeal residue  - valleculae Pharyngeal Material enters airway, passes BELOW cords without attempt by patient to eject out (silent aspiration) Pharyngeal- Thin Straw -- Pharyngeal -- Pharyngeal- Puree Delayed swallow initiation-vallecula;Pharyngeal residue - valleculae;Reduced laryngeal elevation;Reduced airway/laryngeal closure;Reduced tongue base retraction Pharyngeal -- Pharyngeal- Mechanical Soft -- Pharyngeal -- Pharyngeal- Regular Delayed swallow initiation-vallecula;Reduced tongue base retraction;Pharyngeal residue - valleculae;Reduced laryngeal elevation Pharyngeal -- Pharyngeal- Multi-consistency -- Pharyngeal -- Pharyngeal- Pill -- Pharyngeal -- Pharyngeal Comment --  No flowsheet data found. Page, Selinda Orion 06/15/2015, 12:30 PM               CBC  Recent Labs Lab 06/30/15 0340 07/03/15 0525 07/06/15 0419  WBC 8.7 8.8 8.0  HGB 9.4* 10.8* 11.6*  HCT 30.4* 34.3* 37.6  PLT 272 265 268  MCV 89.1 90.7 90.4  MCH 27.6 28.6 27.9  MCHC 30.9 31.5 30.9  RDW 15.7* 15.9* 15.4    Chemistries   Recent Labs Lab 06/30/15 0340 07/01/15 0530 07/03/15 0525 07/06/15 0419  NA 142 140 142 142  K 3.4* 3.9 3.5 3.7  CL 104 102 103 105  CO2 27 28 28 28   GLUCOSE 164* 168* 136* 149*  BUN 7 9 10 10   CREATININE 0.37* 0.39* 0.47 0.47  CALCIUM 9.1 9.3 9.2 9.5  MG 1.7  --   --   --   AST 20  --  20  --   ALT 33  --  31  --   ALKPHOS 51  --  46  --   BILITOT 0.3  --  0.5  --    ------------------------------------------------------------------------------------------------------------------ estimated creatinine clearance is 79.6 mL/min (by C-G formula based on Cr of 0.47). ------------------------------------------------------------------------------------------------------------------ No results for input(s): HGBA1C in the last 72 hours. ------------------------------------------------------------------------------------------------------------------ No results for input(s): CHOL, HDL, LDLCALC, TRIG, CHOLHDL,  LDLDIRECT in the last 72 hours. ------------------------------------------------------------------------------------------------------------------ No results for input(s): TSH, T4TOTAL, T3FREE, THYROIDAB in the last 72 hours.  Invalid input(s): FREET3 ------------------------------------------------------------------------------------------------------------------ No results for input(s): VITAMINB12, FOLATE, FERRITIN, TIBC, IRON, RETICCTPCT in the last 72 hours.  Coagulation profile No results for input(s): INR, PROTIME in the last 168 hours.  No results for input(s): DDIMER in the last 72 hours.  Cardiac Enzymes  No results for input(s): CKMB, TROPONINI, MYOGLOBIN in the last 168 hours.  Invalid input(s): CK ------------------------------------------------------------------------------------------------------------------ Invalid input(s): POCBNP    Jenisa Monty D.O. on 07/06/2015 at 12:06 PM  Between 7am to 7pm - Pager - [phone]  After 7pm go to [email or web address] - password [email or web address]  And look for the night coverage person covering for me after hours  [facility] Office  [phone]

## 2015-07-06 NOTE — Care Management Note (Signed)
Case Management Note  Patient Details  Name: Kerry [name] MRN: [id number] Date of Birth: 01/09/1956  Action/Plan: Received call from [employer], spouse cannot afford the co pay for SNF and plans to take the patient home with [facility] services. Choice offered, [name] ( spouse chose [facility]). [name] with [facility] called for arrangements. CM talked to spouse and son [name] about the need of education for trach care. Both are agreeable to trach training. When [name] is not home, [name] will to be home to care for the patient. Respiratory Consult placed Trach to be changed or downsized by Pulmonary; stitches are still in at this time. PEG tube clamped, patient is eating Patient has a hospital bed, wheelchair, 3:1; possibly need at discharge oxygen, nebulizer machine, suction machine  Expected Discharge Date:   possibly  07/09/2015              Expected Discharge Plan:  [place]  In-House Referral:  Clinical Social Work  Discharge planning Services  CM Consult  Choice offered to:  Spouse  HH Arranged:  Therapist, sports, PT, Nurse's Aide, Respirator Therapy, Speech Therapy HH Agency:  [place]  Status of Service:  In process, will continue to follow  Sherrilyn Rist [phone] 07/06/2015, 10:43 AM

## 2015-07-06 NOTE — Progress Notes (Signed)
I met with pt, her spouse, and son [name] at bedside to discuss why pt not a candidate to be readmitted to inpt rehab. Questions answered. They plan to d/c home with pt on Monday. [phone]

## 2015-07-06 NOTE — Progress Notes (Signed)
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 She was sent to an LTAC and then CIR. She had RLL PE on 06/07/15. She developed altered mental status and acute hypoxic/hypercapnic respiratory failure 06/19/15 and was transferred back to ICU from [place]. Was difficult intubation. Found to have MSSA HCAP and E coli UTI.  Pt's additional PMH includes DM, Lt breast cancer s/p mastectomy, DDD of cervical and lumbar spine, fibromyalgia, carpal tunnel Rt UE.    PT Comments    Patient with slow progress, but seems to have all in place for d/c home when cleared.  Will need continuing HHPT and likely aide services at d/c.    Follow Up Recommendations  Home health PT;Supervision/Assistance - 24 hour;Other (comment) ([place] aide)     Equipment Recommendations  None recommended by PT    Recommendations for Other Services       Precautions / Restrictions Precautions Precautions: Fall Precaution Comments: PEG, Rt shoulder subluxation Required Braces or Orthoses: Other Brace/Splint Other Brace/Splint: R foot PRAFO Restrictions Weight Bearing Restrictions: No    Mobility  Bed Mobility Overal bed mobility: Needs Assistance Bed Mobility: Supine to Sit         Sit to sidelying: HOB elevated;Mod assist General bed mobility comments: assist for bringing R LE and hip forward, pt pulled up on rail, HOB about 60  Transfers Overall transfer level: Needs assistance Equipment used: 2 person hand held assist Transfers: Stand Pivot Transfers;Sit to/from Stand Sit to Stand: Mod assist;+2 physical assistance Stand pivot transfers: Mod assist;Max assist;+2 physical assistance       General transfer comment: Assist on R to prevent knee and hip flexion; remains with flexed R trunk and head down;  able to elevate some with cues and support; transfer to chair stand pivot with +2 and cues   Ambulation/Gait                 Stairs            Wheelchair Mobility    Modified Rankin (Stroke Patients Only)       Balance Overall balance assessment: Needs assistance Sitting-balance support: Single extremity supported;Feet supported Sitting balance-Leahy Scale: Poor Sitting balance - Comments: holding herself up with bed rail     Standing balance-Leahy Scale: Zero Standing balance comment: able to maintain standing for brief moments with +2 A, flexed and R rotated posture                    Cognition Arousal/Alertness: Awake/alert Behavior During Therapy: WFL for tasks assessed/performed Overall Cognitive Status: Difficult to assess Area of Impairment: Problem solving;Following commands;Safety/judgement         Safety/Judgement: Decreased awareness of safety;Decreased awareness of deficits   Problem Solving: Slow processing;Decreased initiation;Difficulty sequencing;Requires verbal cues;Requires tactile cues General Comments: planned to stand from bed and the back to bed, then transfer to chair and pt well informed, but still trying to transfer to the chair  despite cues and assist to maintain standing    Exercises General Exercises - Lower Extremity Ankle Circles/Pumps: PROM;AROM;Both;10 reps;Supine Heel Slides: PROM;AROM;Both;Supine;5 reps    General Comments General comments (skin integrity, edema, etc.): Spoke with spouse who reports likely plan to d/c home Monday and alredy has ramp, w/c, BSC, hospital bed and has been through  therapy along with son's fiance to assist with transfers      Pertinent Vitals/Pain Faces Pain Scale: Hurts little more Pain Location: R LE with AAROM/PROM Pain Descriptors / Indicators: Grimacing;Guarding Pain Intervention(s): Repositioned;Monitored during session;Limited activity within patient's tolerance    Home  Living                      Prior Function            PT Goals (current goals can now be found in the care plan section) Progress towards PT goals: Progressing toward goals    Frequency  Min 3X/week    PT Plan Discharge plan needs to be updated    Co-evaluation             End of Session Equipment Utilized During Treatment: Gait belt;Oxygen Activity Tolerance: Patient limited by fatigue Patient left: in chair;with call bell/phone within reach;with chair alarm set;with family/visitor present     Time: 1035-1100 PT Time Calculation (min) (ACUTE ONLY): 25 min  Charges:  $Therapeutic Activity: 23-37 mins                    G Codes:      Reginia Naas 08-05-15, 11:50 AM  Magda Kiel, [place] 08-05-15

## 2015-07-07 LAB — GLUCOSE, CAPILLARY
Glucose-Capillary: 141 mg/dL — ABNORMAL HIGH (ref 65–99)
Glucose-Capillary: 164 mg/dL — ABNORMAL HIGH (ref 65–99)
Glucose-Capillary: 128 mg/dL — ABNORMAL HIGH (ref 65–99)
Glucose-Capillary: 145 mg/dL — ABNORMAL HIGH (ref 65–99)

## 2015-07-07 NOTE — Progress Notes (Signed)
ATC setup changed 

## 2015-07-07 NOTE — Progress Notes (Signed)
[facility]                                                                              Patient Demographics  Kerry Kerry [name], is a 60 y.o. Kerry [name], DOB - 1956/02/02, [id number]  Admit date - 06/19/2015   Admitting Physician Colbert Coyer, MD  Outpatient Primary MD for the patient is [name], MD  LOS - 18   No chief complaint on file.     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 She was sent to an LTAC and then CIR. She had RLL PE on 06/07/15. She developed altered mental status and acute hypoxic/hypercapnic respiratory failure 06/19/15 and was transferred back to ICU from [place]. Was difficult intubation. Found to have MSSA HCAP and E coli UTI.  Assessment & Plan   Acute hypoxic/hypercapnic respiratory failure -s/p trach 06/27/2015 - doing well 06/29/15 on ATC - ongoing trach care per PCCM - downsizing or decannulation by PCCM will be done on 07/09/2015  Probable aspiration event 12/31 - Dysphagia s/p G tube -Was previously on dysphagia 2 diet with nectar thick liquids - per SLP pt is a silent aspirator - has been cleared for a D3 diet w/ nectar liquids - stopped tube feeds and following diet intake though PEG remains in place (clamped)  Acute encephalopathy  -Appears to have returned to baseline mental status   MSSA HCAP -Has completed a total of 11 days of abx tx - no symptoms of persisting infection  E coli UTI -Has completed abx course   RLL PE - R leg DVT - mid dec 2016 -Continue Xarelto   Anemia of critical illness -Hgb stable/improving slowly  -Monitor CBC  Vocal cord paresis -As per PCCM  Recent Lt MCA CVA -Cannot 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 Pending trach change by pulmonology - planned for 1/16..  Will likely be ready for discharge 07/09/2015 to home with home health services.  Time Spent in minutes   30 minutes  Procedures / significant events 12/14 CT chest > RLL PE 12/15 Doppler > DVT Rt common femoral vein  12/16 Laryngoscopy > vocal fold paresis/hypomobility 12/17 CT head > Lt MCA CVA 12/20 D/C from hospital - Admit to [place]  12/27 Readmit to hospital from [place] - transfer to ICU - intubated 12/27 CT head > ?acute Lt pontine infarct 12/27 EEG > diffuse slowing with focus over Lt posterior temporal region 12/28 MRI brain > no evidence of ischemia in pons > findings more likely wallerian degeneration  12/28 Neurology consulted - off pressors 12/30 self extubated 12/31 aspiration > reintubated 06/26/15 - Alert weaning 06/27/15 - s/p trach 06/27/2015 by DF  Consults   PCCM  DVT Prophylaxis  Xarelto  Lab Results  Component Value Date   PLT 268 07/06/2015  Medications  Scheduled Meds: . amLODipine  10 mg Oral Daily  . antiseptic oral rinse  7 mL Mouth Rinse q12n4p  . atorvastatin  40 mg Oral q1800  . carvedilol  6.25 mg Oral BID WC  . chlorhexidine  15 mL Mouth Rinse BID  . feeding supplement (ENSURE ENLIVE)  237 mL Oral Q24H  . folic acid  1 mg Oral Daily  . insulin aspart  0-15 Units Subcutaneous TID WC  . lisinopril  10 mg Oral Daily  . pantoprazole sodium  40 mg Oral Q24H  . rivaroxaban  20 mg Oral Q supper  . thiamine  100 mg Oral Daily   Continuous Infusions:  PRN Meds:.acetaminophen, fentaNYL (SUBLIMAZE) injection, hydrALAZINE, ondansetron, RESOURCE THICKENUP CLEAR  Antibiotics    Anti-infectives    Start     Dose/Rate Route Frequency Ordered Stop    06/25/15 1100  cefTRIAXone (ROCEPHIN) 1 g in dextrose 5 % 50 mL IVPB  Status:  Discontinued     1 g 100 mL/hr over 30 Minutes Intravenous Every 24 hours 06/25/15 1048 06/29/15 1825   06/23/15 1400  piperacillin-tazobactam (ZOSYN) IVPB 3.375 g  Status:  Discontinued     3.375 g 12.5 mL/hr over 240 Minutes Intravenous 3 times per day 06/23/15 0703 06/25/15 1048   06/23/15 0800  piperacillin-tazobactam (ZOSYN) IVPB 3.375 g     3.375 g 100 mL/hr over 30 Minutes Intravenous STAT 06/23/15 0703 06/23/15 0831   06/23/15 0800  vancomycin (VANCOCIN) IVPB 1000 mg/200 mL premix  Status:  Discontinued     1,000 mg 200 mL/hr over 60 Minutes Intravenous Every 8 hours 06/23/15 0703 06/25/15 1048   06/21/15 1400  ceFAZolin (ANCEF) IVPB 1 g/50 mL premix  Status:  Discontinued     1 g 100 mL/hr over 30 Minutes Intravenous 3 times per day 06/21/15 1111 06/23/15 0651   06/21/15 1200  levofloxacin (LEVAQUIN) IVPB 750 mg  Status:  Discontinued     750 mg 100 mL/hr over 90 Minutes Intravenous Every 24 hours 06/21/15 0854 06/21/15 1111   06/19/15 2200  vancomycin (VANCOCIN) IVPB 1000 mg/200 mL premix  Status:  Discontinued     1,000 mg 200 mL/hr over 60 Minutes Intravenous Every 12 hours 06/19/15 0921 06/21/15 0854   06/19/15 1000  cefTAZidime (FORTAZ) 1 g in dextrose 5 % 50 mL IVPB  Status:  Discontinued     1 g 100 mL/hr over 30 Minutes Intravenous Every 8 hours 06/19/15 0921 06/21/15 0854   06/19/15 1000  vancomycin (VANCOCIN) 1,500 mg in sodium chloride 0.9 % 500 mL IVPB     1,500 mg 250 mL/hr over 120 Minutes Intravenous  Once 06/19/15 [id number] 06/19/15 1521      Subjective:   [name] seen and examined today.  Limited communication, can mouth a few words and write, along with hand signals.  Denies pain, chest pain, shortness of breath, abdominal pain, N/V.   Objective:   Filed Vitals:   07/07/15 0437 07/07/15 0602 07/07/15 0739 07/07/15 0741  BP:  152/56    Pulse: 73 74  70  Temp:  98.4 F  (36.9 C)    TempSrc:  Oral    Resp: 16 17  16   Height:      Weight:  75.342 kg (166 lb 1.6 oz)    SpO2: 100% 98% 100% 100%    Wt Readings from Last 3 Encounters:  07/07/15 75.342 kg (166 lb 1.6 oz)  06/14/15 76.5 kg (168 lb 10.4 oz)  06/08/15  80.1 kg (176 lb 9.4 oz)     Intake/Output Summary (Last 24 hours) at 07/07/15 1031 Last data filed at 07/07/15 1024  Gross per 24 hour  Intake    390 ml  Output      0 ml  Net    390 ml    Exam (no change)  General: Well developed, NAD  HEENT: NCAT,  mucous membranes moist.   Neck: Trach collar   Cardiovascular: S1 S2 auscultated,RRR, no murmurs  Respiratory: CTAB (anteriorly)  Abdomen: Soft, nontender, nondistended, + bowel sounds. peg  Extremities: warm dry without cyanosis clubbing or edema  Data Review   Micro Results No results found for this or any previous visit (from the past 240 hour(s)).  Radiology Reports Dg Chest 2 View  06/16/2015  CLINICAL DATA:  Breathing difficulty. EXAM: CHEST  2 VIEW COMPARISON:  06/05/2015 FINDINGS: Moderate cardiac enlargement. There is no pleural effusion or edema. The lungs appear clear. Spondylosis noted within the thoracic spine. IMPRESSION: 1. Cardiac enlargement. Electronically Signed   By: Kerby Moors M.D.   On: 06/16/2015 17:35   Ct Head Wo Contrast  06/19/2015  CLINICAL DATA:  Altered mental status.  Unresponsive.  Hypoxic. EXAM: CT HEAD WITHOUT CONTRAST TECHNIQUE: Contiguous axial images were obtained from the base of the skull through the vertex without intravenous contrast. COMPARISON:  06/08/2015 FINDINGS: There is no evidence of mass effect, midline shift or extra-axial fluid collections. There is no evidence of a space-occupying lesion or intracranial hemorrhage. There is no evidence of a cortical-based area of acute infarction. There is a small low-attenuation area in the left pons concerning for an acute left pontine infarct. There is a large chronic left MCA territory  infarct. The ventricles and sulci are appropriate for the patient's age. The basal cisterns are patent. Visualized portions of the orbits are unremarkable. The visualized portions of the paranasal sinuses and mastoid air cells are unremarkable. The osseous structures are unremarkable. IMPRESSION: 1. Small low-attenuation area in the left pons concerning for an acute left pontine infarct. 2. Old left MCA territory infarct. Electronically Signed   By: Kathreen Devoid   On: 06/19/2015 13:32   Ct Head Wo Contrast  06/09/2015  CLINICAL DATA:  Initial evaluation for deteriorating mental status. EXAM: CT HEAD WITHOUT CONTRAST TECHNIQUE: Contiguous axial images were obtained from the base of the skull through the vertex without intravenous contrast. COMPARISON:  Prior study from 04/16/2015. FINDINGS: There has been continued interval evolution of large volume left MCA territory infarct, overall stable in size and distribution as compared to previous study. There is increased volume loss and encephalomalacia within the area of infarction. No evidence for associated hemorrhage. Previously seen midline shift has essentially resolved. No new acute large vessel territory infarct. No intracranial hemorrhage. No mass lesion. No hydrocephalus. Basilar cisterns are patent. No extra-axial fluid collection. Mild chronic microvascular ischemic disease noted within the periventricular white matter. Scattered atheromatous plaque within the carotid siphons. Scalp soft tissues within normal limits. No acute abnormality about the orbits. Paranasal sinuses are clear.  No mastoid effusion. Calvarium intact. IMPRESSION: 1. Normal expected interval evolutional change of large left MCA territory infarct. Associated edema has largely resolved. 2. No new intracranial process. Electronically Signed   By: Jeannine Boga M.D.   On: 06/09/2015 00:47   Mr Brain Wo Contrast  06/21/2015  CLINICAL DATA:  Follow-up new pontine stroke. History of  LEFT-sided stroke. EXAM: MRI HEAD WITHOUT CONTRAST TECHNIQUE: Multiplanar, multiecho pulse sequences of the  brain and surrounding structures were obtained without intravenous contrast. COMPARISON:  CT head June 19, 2015 FINDINGS: Patchy T2 bright signal within the LEFT midbrain and pons corresponding to CT abnormality without superimposed reduced diffusion to suggest acute ischemia. Extensive reduced diffusion LEFT frontotemporal parietal lobes, predominately with T2 shine through. Patchy areas of low ADC value, however there is underlying susceptibility artifact and, this is likely spurious. LEFT basal ganglia and LEFT thalamus infarcts. Mild ex vacuo dilatation LEFT lateral ventricle. No hydrocephalus. No midline shift or mass effect. No mass lesions. No abnormal extra-axial fluid collection. Loss of LEFT internal carotid artery flow void from the skullbase. Probable retrograde flow as there is LEFT MCA and bilateral anterior cerebral artery flow voids. Ocular globes and orbital contents are normal. Small paranasal sinus air-fluid levels. Life-support lines in place. Moderate bilateral mastoid effusions. No abnormal sellar expansion. No cerebellar tonsillar ectopia. No suspicious calvarial bone marrow signal. IMPRESSION: No evidence of acute ischemia with particular attention to the pons. Old LEFT hemorrhagic MCA territory infarct. Chronically occluded LEFT internal carotid artery. T2 bright signal within the LEFT pons and midbrain, favoring early wallerian degeneration, less likely old ischemia. Electronically Signed   By: Elon Alas M.D.   On: 06/21/2015 04:07   Dg Chest Port 1 View  06/27/2015  CLINICAL DATA:  Acute respiratory failure.  Tracheostomy. EXAM: PORTABLE CHEST 1 VIEW COMPARISON:  06/26/2015 FINDINGS: Endotracheal tube has been rib moved. A tracheostomy tube has been placed and projects over the airway, terminating at the inferior margin of the clavicular heads. The cardiac silhouette  remains mildly enlarged. Right basilar airspace opacity has increased. Mild left basilar opacity does not appear significantly changed. No sizable pleural effusion or pneumothorax is identified. IMPRESSION: Increased right basilar airspace disease. Electronically Signed   By: Logan Bores M.D.   On: 06/27/2015 15:06   Dg Chest Port 1 View  06/26/2015  CLINICAL DATA:  Respiratory failure. Subsequent encounter. History of hypertension and diabetes. EXAM: PORTABLE CHEST 1 VIEW COMPARISON:  06/26/2015 at 6:11 a.m. FINDINGS: Interstitial opacities and patchy lung base airspace opacities have improved consistent with improved pulmonary edema. No new lung opacities. No convincing pleural effusion and no pneumothorax. Endotracheal tube now projects 1.7 cm above the carina. IMPRESSION: Significantly improved lung aeration since the earlier study consistent with improved pulmonary edema. No new abnormalities. Electronically Signed   By: Lajean Manes M.D.   On: 06/26/2015 12:21   Dg Chest Port 1 View  06/26/2015  CLINICAL DATA:  Hypoxia. EXAM: PORTABLE CHEST 1 VIEW COMPARISON:  June 25, 2015 FINDINGS: Endotracheal tube tip is 1.2 cm above the carina. No pneumothorax. There is interstitial edema in the lung base regions. There is no airspace consolidation. There is cardiomegaly. The pulmonary vascularity is normal. No adenopathy. No bone lesions. IMPRESSION: Bibasilar interstitial edema with mild cardiac enlargement. Suspect a degree of congestive heart failure. Currently there is no airspace consolidation appreciable. Endotracheal tube tip is 1.2 cm above the carina. No pneumothorax. It may be prudent to consider withdrawing endotracheal tube 2 to 3 cm. Electronically Signed   By: Lowella Grip III M.D.   On: 06/26/2015 07:53   Dg Chest Port 1 View  06/25/2015  CLINICAL DATA:  Respiratory distress. EXAM: PORTABLE CHEST 1 VIEW COMPARISON:  Earlier today at 712 hours FINDINGS: 1611 hours Endotracheal tube terminates  2.0 cm above carina.Patient rotated right. Cardiomegaly accentuated by AP portable technique. No pleural effusion or pneumothorax. Low lung volumes with resultant pulmonary interstitial prominence. Persistent  right base airspace disease. Clear left lung. IMPRESSION: No change in right base airspace disease. Cardiomegaly and low lung volumes, without congestive failure. Electronically Signed   By: Abigail Miyamoto M.D.   On: 06/25/2015 17:05   Dg Chest Port 1 View  06/25/2015  CLINICAL DATA:  Respiratory failure. EXAM: PORTABLE CHEST 1 VIEW COMPARISON:  06/24/2015 and 06/23/2015 FINDINGS: Endotracheal tube in good position. Further clearing of the infiltrate in the right lung. Heart size and vascularity are now normal. Left lung is clear. No effusions. IMPRESSION: Further clearing of the infiltrate in the right lung with slight residual infiltrate at the base. Electronically Signed   By: Lorriane Shire M.D.   On: 06/25/2015 09:22   Dg Chest Port 1 View  06/24/2015  CLINICAL DATA:  Healthcare associated pneumonia. EXAM: PORTABLE CHEST 1 VIEW COMPARISON:  06/23/2015 FINDINGS: Go endotracheal tube is in place 2.6 cm above the carina. Heart size is upper limits normal. Patchy infiltrate is identified throughout the right lung and at the left lung base, slightly improved compared prior study. IMPRESSION: Persistent bilateral infiltrate showing slight interval improvement in aeration. Electronically Signed   By: Nolon Nations M.D.   On: 06/24/2015 08:58   Dg Chest Port 1 View  06/23/2015  CLINICAL DATA:  Endotracheal tube placement.  Initial encounter. EXAM: PORTABLE CHEST 1 VIEW COMPARISON:  Chest radiograph performed 06/22/2015 FINDINGS: The patient's endotracheal tube is seen ending 2-3 cm above the carina. New diffuse right-sided airspace opacification raises concern for pneumonia. No definite pleural effusion or pneumothorax is seen. Minimal left basilar airspace opacity is also seen. No pneumothorax identified  The cardiomediastinal silhouette is borderline enlarged. No acute osseous abnormalities are seen. IMPRESSION: 1. Endotracheal tube seen ending 2-3 cm above the carina. 2. Diffuse new right-sided airspace opacification raises concern for pneumonia. 3. Borderline cardiomegaly. Electronically Signed   By: Garald Balding M.D.   On: 06/23/2015 02:33   Dg Chest Port 1 View  06/22/2015  CLINICAL DATA:  60 year old Kerry [name] with Healthcare associated pneumonia. Subsegmental pulmonary emboli earlier this month. Initial encounter. EXAM: PORTABLE CHEST 1 VIEW COMPARISON:  06/21/2015 and earlier. FINDINGS: Portable AP semi upright view at 0617 hours. Stable endotracheal tube. Stable visualized enteric tube. Stable cardiac size and mediastinal contours. Lung volumes are within normal limits. Allowing for portable technique, the lungs are clear. No pneumothorax or pleural effusion. IMPRESSION: 1.  Stable lines and tubes. 2.  No acute cardiopulmonary abnormality. Electronically Signed   By: Genevie Ann M.D.   On: 06/22/2015 07:57   Dg Chest Port 1 View  06/21/2015  CLINICAL DATA:  Respiratory failure.  Followup exam. EXAM: PORTABLE CHEST 1 VIEW COMPARISON:  06/20/2015 FINDINGS: Endotracheal tube tip projects 2.9 cm above the carina, stable and well-positioned. Orogastric tube passes below the diaphragm into the stomach also stable. Cardiac silhouette there is normal in size and configuration. No mediastinal or hilar masses. There is persistent medial lung base opacity. This may reflect atelectasis or pneumonia, with atelectasis favored. No pulmonary edema. No evidence of pneumothorax. IMPRESSION: 1. Persistent medial lung base opacity most likely due to atelectasis. Pneumonia is possible. 2. Support apparatus well positioned. 3. No significant change from previous day's study allowing for differences in patient positioning and technique. Electronically Signed   By: Lajean Manes M.D.   On: 06/21/2015 08:16   Dg Chest Port 1  View  06/20/2015  CLINICAL DATA:  Hypoxia EXAM: PORTABLE CHEST 1 VIEW COMPARISON:  June 19, 2015 FINDINGS: Endotracheal tube tip is  3.8 cm above the carina. Nasogastric tube tip and side port are in the distal stomach region. No apparent pneumothorax. There is a small area of infiltrate in the right base. Lungs elsewhere clear. Heart is upper normal in size with pulmonary vascularity within normal limits. No adenopathy. IMPRESSION: Tube positions as described without pneumothorax. Small area of infiltrate right base. Lungs elsewhere clear. No change in cardiac silhouette. Electronically Signed   By: Lowella Grip III M.D.   On: 06/20/2015 07:30   Dg Chest Port 1 View  06/19/2015  CLINICAL DATA:  60 year old Kerry [name] status post adjustment of the endotracheal tube. EXAM: PORTABLE CHEST 1 VIEW COMPARISON:  Earlier chest radiograph 06/19/2015 FINDINGS: There has been interval retraction of the endotracheal tube with tip Kerry [name] approximately 5 cm above the carina. There has been interval removal of the enteric tube. No significant interval change in the appearance of the lungs. Pleural effusion or pneumothorax. Stable cardiac silhouette. The osseous structures appear unremarkable. IMPRESSION: Interval retraction of the endotracheal tube with tip now approximately 5 cm above the carina. Electronically Signed   By: Anner Crete M.D.   On: 06/19/2015 02:45   Dg Chest Port 1 View  06/19/2015  CLINICAL DATA:  Endotracheal tube repositioning.  Initial encounter. EXAM: PORTABLE CHEST 1 VIEW COMPARISON:  Chest radiograph performed earlier today at 1:23 a.m. FINDINGS: The patient's endotracheal tube is seen ending 1-2 cm above the carina. This could be retracted 1-2 cm, as deemed clinically appropriate. The enteric tube is seen extending below the diaphragm. Right basilar airspace opacity appears improved, and may reflect atelectasis or possibly mild pneumonia. No definite pleural effusion or pneumothorax is  seen. The cardiomediastinal silhouette is normal in size. No acute osseous abnormalities are identified. External pacing pads are noted. IMPRESSION: 1. Endotracheal tube seen ending 1-2 cm above the carina. This could be retracted 1-2 cm, as deemed clinically appropriate. 2. Right basilar airspace opacity appears improved, and may reflect atelectasis or possibly mild pneumonia. These results were called by telephone at the time of interpretation on 06/19/2015 at 1:58 am to [facility] on [facility], who verbally acknowledged these results. Electronically Signed   By: Garald Balding M.D.   On: 06/19/2015 02:04   Dg Chest Port 1 View  06/19/2015  CLINICAL DATA:  Endotracheal tube placement.  Initial encounter. EXAM: PORTABLE CHEST 1 VIEW COMPARISON:  None. FINDINGS: The patient's endotracheal tube is seen ending just above the carina. This should be retracted 2-3 cm. The enteric tube is noted extending below the diaphragm. The lungs are well-aerated. Right basilar airspace opacity may reflect atelectasis or pneumonia. A small right pleural effusion is suspected. No pneumothorax is seen. The cardiomediastinal silhouette is within normal limits. No acute osseous abnormalities are seen. External pacing pads are noted. IMPRESSION: 1. Endotracheal tube seen ending just above the carina. This should be retracted 3 cm. 2. Right basilar airspace opacity may reflect atelectasis or pneumonia. Small right pleural effusion suspected. These results were called by telephone at the time of interpretation on 06/19/2015 at 1:58 am to [facility] on [facility], who verbally acknowledged these results. Electronically Signed   By: Garald Balding M.D.   On: 06/19/2015 02:02   Dg Chest Port 1 View  06/19/2015  CLINICAL DATA:  60 year old Kerry [name] with shortness of breath EXAM: PORTABLE CHEST 1 VIEW COMPARISON:  Radiograph dated 06/18/2015 FINDINGS: The patient is tilted to the right. There is stable cardiomegaly. Bibasilar linear atelectatic  changes/scarring noted. There is no focal consolidation, pleural  effusion, or pneumothorax. There is degenerative changes of the spine. No acute fracture. IMPRESSION: Stable cardiomegaly.  No acute cardiopulmonary process. Electronically Signed   By: Anner Crete M.D.   On: 06/19/2015 00:41   Dg Chest Port 1 View  06/18/2015  CLINICAL DATA:  Acute onset of shortness of breath on exertion. Initial encounter. EXAM: PORTABLE CHEST 1 VIEW COMPARISON:  Chest radiograph performed 06/16/2015 FINDINGS: The lungs are well-aerated. Mild bibasilar atelectasis is noted. There is no evidence of pleural effusion or pneumothorax. The cardiomediastinal silhouette is enlarged. No acute osseous abnormalities are seen. IMPRESSION: Mild bibasilar atelectasis noted.  Cardiomegaly noted. Electronically Signed   By: Garald Balding M.D.   On: 06/18/2015 04:44   Dg Abd Portable 1v  06/19/2015  CLINICAL DATA:  Evaluate orogastric tube placement EXAM: PORTABLE ABDOMEN - 1 VIEW COMPARISON:  06/19/15, 06/04/15 FINDINGS: OG tube projects over the stomach with tip over the anticipated position of the antrum. PEG tube also projects over the stomach. Oral contrast noted throughout the large bowel. 10mm radiodensity projects over the left abdomen over a loop of large bowel and is not seen on 06/04/15. IMPRESSION: OG tube as described. Abnormal density could represent foreign body either within the abdominal cavity or on the body wall, as well as possibly ingested material. Electronically Signed   By: Skipper Cliche M.D.   On: 06/19/2015 11:18   Dg Abd Portable 1v  06/19/2015  CLINICAL DATA:  Orogastric tube placement.  Initial encounter. EXAM: PORTABLE ABDOMEN - 1 VIEW COMPARISON:  Abdominal radiograph performed 06/04/2015 FINDINGS: The patient's enteric tube is noted ending overlying the body of the stomach. The endotracheal tube is noted ending just above the carina. This should be retracted 2-3 cm. The visualized bowel gas pattern  is difficult to fully assess due to motion artifact, though contrast is seen within the colon. Right basilar airspace opacity is noted. No pleural effusion is seen. No acute osseous abnormalities are identified. External pacing pads are noted. IMPRESSION: 1. Enteric tube noted ending overlying the body of the stomach. 2. Endotracheal tube seen ending just above the carina. This should be retracted 2-3 cm. 3. Right basilar airspace opacity noted. These results were called by telephone at the time of interpretation on 06/19/2015 at 1:58 am to [facility] on [facility], who verbally acknowledged these results. Electronically Signed   By: Garald Balding M.D.   On: 06/19/2015 02:00   Dg Swallowing Func-speech Pathology  07/02/2015  Objective Swallowing Evaluation:   Patient Details Name: Kerry BISKNER MRN: [id number] Date of Birth: [birth year] Today's Date: 07/02/2015 Time: SLP Start Time (ACUTE ONLY): 1040-SLP Stop Time (ACUTE ONLY): 1100 SLP Time Calculation (min) (ACUTE ONLY): 20 min Past Medical History: @PMH @ Past Surgical History: Past Surgical History Procedure Laterality Date . Cardiac surgery   . Appendectomy   . Breast surgery   . Mastectomy Left 03/2007 . Cardiac catheterization  2005 . Vein surgery Right 2005 . Tubal ligation Right  . Radiology with anesthesia N/A 04/11/2015   Procedure: RADIOLOGY WITH ANESTHESIA;  Surgeon: Luanne Bras, MD;  Location: [facility];  Service: Radiology;  Laterality: N/A; . Cardiac catheterization N/A 04/13/2015   Procedure: Temporary Pacemaker;  Surgeon: Peter M Martinique, MD;  Location: [facility];  Service: Cardiovascular;  Laterality: N/A; . Peg placement N/A 04/18/2015   Procedure: PERCUTANEOUS ENDOSCOPIC GASTROSTOMY (PEG) PLACEMENT;  Surgeon: Judeth Horn, MD;  Location: [facility];  Service: General;  Laterality: N/A;  bedside . 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 She was sent to an LTAC and then CIR. She had PEG and trach 10/26 (has since been decannulated). She had RLL PE on 06/07/15. She developed altered mental status and acute hypoxic/hypercapnic respiratory failure 06/19/15 and was transferred back to ICU from [place]. CT of the head with ? acute left pontine infarct. Was difficult intubation. s/p tracheostomy 1/4. CCM notes vocal cord paresis/hypomobility per laryngoscopy 12/16. Found to have MSSA HCAP and E coli UTI. Subjective: pt alert, aphasic, inspiratory stridor Assessment / Plan / Recommendation CHL IP CLINICAL IMPRESSIONS 07/02/2015 Therapy Diagnosis Moderate pharyngeal phase dysphagia Clinical Impression Pt presents with a primary sensory based oropharygneal dysphagia. Pt tested with and without PMSV (see treatment note), no change in function. With liquid boluses pt has an intermittent delay in swallow initiation. With thin liquids this delay occurs with the initial sips resulting in silent aspiration before the swallow; pt eventually responds with a cough when aspirate fully descends trachea. Pt consistently protected airway consuming nectar, but did have some delay with an independent second swallow to fully clear trace lingual residual. Pts function with nectar was adequate even with large consecutive straw sips. Also trialed a chin tuck with thin liquids with great success, no penetration with consecutive straw sips; can be trialed in therapy. Recommend Dys 3 (mechanical soft) diet with nectar thick liquids will follow for tolerance.  Impact on safety and function Mild aspiration risk   CHL IP TREATMENT RECOMMENDATION 07/02/2015 Treatment Recommendations Therapy as outlined in treatment plan below   Prognosis 07/02/2015 Prognosis for Safe Diet Advancement Good Barriers to Reach Goals Language deficits Barriers/Prognosis Comment -- CHL IP  DIET RECOMMENDATION 07/02/2015 SLP Diet Recommendations Dysphagia 3 (Mech soft) solids;Nectar thick liquid Liquid Administration via Cup;Straw Medication Administration Whole meds with puree Compensations Slow rate;Minimize environmental distractions Postural Changes Seated upright at 90 degrees   CHL IP OTHER RECOMMENDATIONS 07/02/2015 Recommended Consults -- Oral Care Recommendations Oral care BID Other Recommendations Order thickener from pharmacy   CHL IP FOLLOW UP RECOMMENDATIONS 07/02/2015 Follow up Recommendations Inpatient Rehab   CHL IP FREQUENCY AND DURATION 07/02/2015 Speech Therapy Frequency (ACUTE ONLY) min 2x/week Treatment Duration 2 weeks      CHL IP ORAL PHASE 07/02/2015 Oral Phase WFL Oral - Pudding Teaspoon -- Oral - Pudding Cup -- Oral - Honey Teaspoon -- Oral - Honey Cup NT Oral - Nectar Teaspoon -- Oral - Nectar Cup WFL Oral - Nectar Straw -- Oral - Thin Teaspoon -- Oral - Thin Cup WFL Oral - Thin Straw WFL Oral - Puree WFL Oral - Mech Soft -- Oral - Regular WFL Oral - Multi-Consistency -- Oral - Pill -- Oral Phase - Comment --  CHL IP PHARYNGEAL PHASE 07/02/2015 Pharyngeal Phase Impaired Pharyngeal- Pudding Teaspoon -- Pharyngeal -- Pharyngeal- Pudding Cup -- Pharyngeal -- Pharyngeal- Honey Teaspoon -- Pharyngeal -- Pharyngeal- Honey Cup NT Pharyngeal -- Pharyngeal- Nectar Teaspoon -- Pharyngeal -- Pharyngeal- Nectar Cup Delayed swallow initiation-pyriform sinuses Pharyngeal Material does not enter airway Pharyngeal- Nectar Straw Delayed swallow initiation-pyriform sinuses Pharyngeal -- Pharyngeal- Thin Teaspoon -- Pharyngeal -- Pharyngeal- Thin Cup Delayed swallow initiation-pyriform sinuses;Penetration/Aspiration before swallow Pharyngeal Material enters airway, passes BELOW cords without attempt by patient to eject out (silent aspiration) Pharyngeal- Thin Straw WFL;Compensatory strategies attempted (with notebox) Pharyngeal Material does not enter airway Pharyngeal- Puree  WFL Pharyngeal -- Pharyngeal-  Mechanical Soft -- Pharyngeal -- Pharyngeal- Regular WFL Pharyngeal -- Pharyngeal- Multi-consistency -- Pharyngeal -- Pharyngeal- Pill WFL Pharyngeal -- Pharyngeal Comment --  CHL IP CERVICAL ESOPHAGEAL PHASE 07/02/2015 Cervical Esophageal Phase WFL Pudding Teaspoon -- Pudding Cup -- Honey Teaspoon -- Honey Cup -- Nectar Teaspoon -- Nectar Cup -- Nectar Straw -- Thin Teaspoon -- Thin Cup -- Thin Straw -- Puree -- Mechanical Soft -- Regular -- Multi-consistency -- Pill -- Cervical Esophageal Comment -- Herbie Baltimore, MA CCC-SLP [phone] Lynann Beaver 07/02/2015, 11:26 AM              Dg Swallowing Func-speech Pathology  06/15/2015  Objective Swallowing Evaluation:   Patient Details Name: TASHAWN Kerry [name] MRN: [id number] Date of Birth: [birth year] Today's Date: 06/15/2015 Time: SLP Start Time: 0855-SLP Stop Time : 0922 SLP Time Calculation (min) : 27 min Past Medical History: Past Medical History Diagnosis Date . Hypertension  . Coronary artery disease  . Diabetes mellitus  . Breast cancer ([place]) 03/10/2007   Left breat . Degenerative disc disease, cervical  . Degenerative disc disease, lumbar  . Fibromyalgia  . Hyperlipidemia  . Carpal tunnel syndrome on right  . PE (pulmonary embolism)  . UTI (lower urinary tract infection)  . GIB (gastrointestinal bleeding)  . Elevated troponin  . HTN (hypertension) 06/13/2015 Past Surgical History: Past Surgical History Procedure Laterality Date . Cardiac surgery   . Appendectomy   . Breast surgery   . Mastectomy Left 03/2007 . Cardiac catheterization  2005 . Vein surgery Right 2005 . Tubal ligation Right  . Radiology with anesthesia N/A 04/11/2015   Procedure: RADIOLOGY WITH ANESTHESIA;  Surgeon: Luanne Bras, MD;  Location: [facility];  Service: Radiology;  Laterality: N/A; . Cardiac catheterization N/A 04/13/2015   Procedure: Temporary Pacemaker;  Surgeon: Peter M Martinique, MD;  Location: [facility];  Service: Cardiovascular;  Laterality: N/A; . Peg placement  N/A 04/18/2015   Procedure: PERCUTANEOUS ENDOSCOPIC GASTROSTOMY (PEG) PLACEMENT;  Surgeon: Judeth Horn, MD;  Location: [facility];  Service: General;  Laterality: N/A;  bedside . Esophagogastroduodenoscopy N/A 04/18/2015   Procedure: ESOPHAGOGASTRODUODENOSCOPY (EGD);  Surgeon: Judeth Horn, MD;  Location: [facility] ENDOSCOPY;  Service: General;  Laterality: N/A; HPI: pt presents with dense L MCA Infarct involving L Temporal, Frontoparietal, and L Basal Ganglia regions and was started on TPA, but unable to complete full dose due to hypertension, but did recieve IR intervention. Pt extubated 10/30. pt with hx of DM, HTN, Breast CA s/p Mastectomy, and Fibromyalgia. Patient transferred to [facility] and then Inpatient Rehab. Transferred back to acute on 06/07/15 due to PE. Readmitted to CIR on 06/13/2015 for completion of family education prior to discharge home.  Assessment / Plan / Recommendation CHL IP CLINICAL IMPRESSIONS 06/15/2015 Therapy Diagnosis Mild oral phase dysphagia;Moderate pharyngeal phase dysphagia Clinical Impression Pt presents with slight decline in swallowing function in comparison to previous MBS, suspect due to deconditioning s/p acute treatment for PE.  Overall pt remains with a mild oral phase dysphagia and moderate pharyngeal phase dysphagia with both sensory and motor components.  Oral phase deficits again are characterized by right sided labial and lingual weakness which results in weakened manipulation and prolonged oral transit of boluses to the oropharynx.  Decreased pharyngeal sensation and decreased base of tongue retraction also resulted in premature spillage of materials into the pharynx and delayed swallow initiation.  Swallow response was delayed to the vallecula with thickened liquids and solids, and further delayed to  the pyriforms with thin liquids.  Due to delayed swallow initiation, pt exhibited deep, silent penetration of nectar thick liquids on initial cup sip and  again when challenged with larger boluses; however, smaller boluses were tolerated without airway compromise.  Post swallow, moderate vallecular residuals were noted with thickened liquids and solids and mild-moderate pyriform residue with thin liquids due to pharyngeal weakness.  Consistent deep penetration during the swallow and eventual unsensed aspiration of residuals after the swallow was visualized with thin liquids, even when boluses were strictly controlled via teaspoon.  Suspect increase in frequency and severity of airway compromise in comparison to previous study to be related to increased respiratory rate and effort required for breathing.  Pt continues to spontaneously use extra swallows which, in combination with SLP interventions for alternating solids and liquids effectively minimized pharyngeal residue.  Recommend that pt remain on dys 2 textures and nectar thick liquids with full supervision for use of swallowing precautions.  Do not recommend continuing with water protocol at this time due to pt's likely diminished functional reserve.  Prognosis for advancement fair with trials of advanced consistencies, SLP follow up for use of safe swallowing strategies, and repeat objective swallow study.   Impact on safety and function Moderate aspiration risk   CHL IP TREATMENT RECOMMENDATION 06/12/2015 Treatment Recommendations Therapy as outlined in treatment plan below   Prognosis 06/15/2015 Prognosis for Safe Diet Advancement Fair Barriers to Reach Goals Cognitive deficits;Language deficits;Motivation Barriers/Prognosis Comment -- CHL IP DIET RECOMMENDATION 06/15/2015 SLP Diet Recommendations Dysphagia 2 (Fine chop) solids;Nectar thick liquid Liquid Administration via Cup Medication Administration Other (Comment) Compensations Slow rate;Small sips/bites;Minimize environmental distractions;Multiple dry swallows after each bite/sip;Follow solids with liquid Postural Changes Seated upright at 90 degrees             CHL IP ORAL PHASE 06/15/2015 Oral Phase Impaired Oral - Pudding Teaspoon -- Oral - Pudding Cup -- Oral - Honey Teaspoon -- Oral - Honey Cup -- Oral - Nectar Teaspoon -- Oral - Nectar Cup Reduced posterior propulsion;Lingual/palatal residue;Piecemeal swallowing;Decreased bolus cohesion;Delayed oral transit;Premature spillage Oral - Nectar Straw -- Oral - Thin Teaspoon -- Oral - Thin Cup Weak lingual manipulation;Reduced posterior propulsion;Lingual/palatal residue;Piecemeal swallowing;Delayed oral transit;Decreased bolus cohesion;Premature spillage Oral - Thin Straw -- Oral - Puree Weak lingual manipulation;Reduced posterior propulsion;Lingual/palatal residue;Premature spillage;Delayed oral transit Oral - Mech Soft -- Oral - Regular Weak lingual manipulation;Reduced posterior propulsion;Lingual/palatal residue;Piecemeal swallowing;Impaired mastication;Delayed oral transit;Decreased bolus cohesion;Premature spillage Oral - Multi-Consistency -- Oral - Pill -- Oral Phase - Comment --  CHL IP PHARYNGEAL PHASE 06/15/2015 Pharyngeal Phase -- Pharyngeal- Pudding Teaspoon -- Pharyngeal -- Pharyngeal- Pudding Cup -- Pharyngeal -- Pharyngeal- Honey Teaspoon -- Pharyngeal -- Pharyngeal- Honey Cup -- Pharyngeal -- Pharyngeal- Nectar Teaspoon -- Pharyngeal -- Pharyngeal- Nectar Cup Delayed swallow initiation-vallecula;Delayed swallow initiation-pyriform sinuses;Reduced laryngeal elevation;Reduced airway/laryngeal closure;Reduced tongue base retraction;Penetration/Aspiration during swallow;Pharyngeal residue - valleculae Pharyngeal Material enters airway, CONTACTS cords and not ejected out Pharyngeal- Nectar Straw -- Pharyngeal -- Pharyngeal- Thin Teaspoon -- Pharyngeal -- Pharyngeal- Thin Cup Reduced airway/laryngeal closure;Reduced laryngeal elevation;Delayed swallow initiation-pyriform sinuses;Reduced tongue base retraction;Penetration/Aspiration during swallow;Penetration/Apiration after swallow;Pharyngeal residue -  pyriform;Pharyngeal residue - valleculae Pharyngeal Material enters airway, passes BELOW cords without attempt by patient to eject out (silent aspiration) Pharyngeal- Thin Straw -- Pharyngeal -- Pharyngeal- Puree Delayed swallow initiation-vallecula;Pharyngeal residue - valleculae;Reduced laryngeal elevation;Reduced airway/laryngeal closure;Reduced tongue base retraction Pharyngeal -- Pharyngeal- Mechanical Soft -- Pharyngeal -- Pharyngeal- Regular Delayed swallow initiation-vallecula;Reduced tongue base retraction;Pharyngeal residue - valleculae;Reduced laryngeal elevation Pharyngeal -- Pharyngeal-  Multi-consistency -- Pharyngeal -- Pharyngeal- Pill -- Pharyngeal -- Pharyngeal Comment --  No flowsheet data found. Page, Selinda Orion 06/15/2015, 12:30 PM               CBC  Recent Labs Lab 07/03/15 0525 07/06/15 0419  WBC 8.8 8.0  HGB 10.8* 11.6*  HCT 34.3* Kerry.6  PLT 265 268  MCV 90.7 90.4  MCH 28.6 27.9  MCHC 31.5 30.9  RDW 15.9* 15.4    Chemistries   Recent Labs Lab 07/01/15 0530 07/03/15 0525 07/06/15 0419  NA 140 142 142  K 3.9 3.5 3.7  CL 102 103 105  CO2 28 28 28   GLUCOSE 168* 136* 149*  BUN 9 10 10   CREATININE 0.39* 0.47 0.47  CALCIUM 9.3 9.2 9.5  AST  --  20  --   ALT  --  31  --   ALKPHOS  --  46  --   BILITOT  --  0.5  --    ------------------------------------------------------------------------------------------------------------------ estimated creatinine clearance is 79.5 mL/min (by C-G formula based on Cr of 0.47). ------------------------------------------------------------------------------------------------------------------ No results for input(s): HGBA1C in the last 72 hours. ------------------------------------------------------------------------------------------------------------------ No results for input(s): CHOL, HDL, LDLCALC, TRIG, CHOLHDL, LDLDIRECT in the last 72  hours. ------------------------------------------------------------------------------------------------------------------ No results for input(s): TSH, T4TOTAL, T3FREE, THYROIDAB in the last 72 hours.  Invalid input(s): FREET3 ------------------------------------------------------------------------------------------------------------------ No results for input(s): VITAMINB12, FOLATE, FERRITIN, TIBC, IRON, RETICCTPCT in the last 72 hours.  Coagulation profile No results for input(s): INR, PROTIME in the last 168 hours.  No results for input(s): DDIMER in the last 72 hours.  Cardiac Enzymes No results for input(s): CKMB, TROPONINI, MYOGLOBIN in the last 168 hours.  Invalid input(s): CK ------------------------------------------------------------------------------------------------------------------ Invalid input(s): POCBNP    Teondre Jarosz D.O. on 07/07/2015 at 10:31 AM  Between 7am to 7pm - Pager - [phone]  After 7pm go to [email or web address] - password [email or web address]  And look for the night coverage person covering for me after hours  [facility] Office  [phone]

## 2015-07-07 NOTE — Progress Notes (Signed)
Sutures removed without any complications.

## 2015-07-08 LAB — CBC
HEMATOCRIT: 37.8 % (ref 36.0–46.0)
HEMOGLOBIN: 12 g/dL (ref 12.0–15.0)
MCH: 28.6 pg (ref 26.0–34.0)
MCHC: 31.7 g/dL (ref 30.0–36.0)
MCV: 90.2 fL (ref 78.0–100.0)
Platelets: 251 10*3/uL (ref 150–400)
RBC: 4.19 MIL/uL (ref 3.87–5.11)
RDW: 14.9 % (ref 11.5–15.5)
WBC: 7.4 10*3/uL (ref 4.0–10.5)

## 2015-07-08 LAB — GLUCOSE, CAPILLARY
GLUCOSE-CAPILLARY: 119 mg/dL — AB (ref 65–99)
GLUCOSE-CAPILLARY: 217 mg/dL — AB (ref 65–99)
GLUCOSE-CAPILLARY: 157 mg/dL — AB (ref 65–99)
GLUCOSE-CAPILLARY: 184 mg/dL — AB (ref 65–99)

## 2015-07-08 LAB — BASIC METABOLIC PANEL
ANION GAP: 8 (ref 5–15)
BUN: 10 mg/dL (ref 6–20)
CHLORIDE: 104 mmol/L (ref 101–111)
CO2: 30 mmol/L (ref 22–32)
CREATININE: 0.48 mg/dL (ref 0.44–1.00)
Calcium: 9.6 mg/dL (ref 8.9–10.3)
GFR calc non Af Amer: >60 mL/min (ref >60)
Glucose, Bld: 122 mg/dL — ABNORMAL HIGH (ref 65–99)
POTASSIUM: 3.6 mmol/L (ref 3.5–5.1)
SODIUM: 142 mmol/L (ref 135–145)

## 2015-07-08 NOTE — Progress Notes (Signed)
[facility]                                                                              Patient Demographics  Kerry [name], is a 60 y.o. female, DOB - 10/27/1955, [id number]  Admit date - 06/19/2015   Admitting Physician Kerry Coyer, MD  Outpatient Primary MD for the patient is Kerry Curet, MD  LOS - 19   No chief complaint on file.     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 She was sent to an LTAC and then CIR. She had RLL PE on 06/07/15. She developed altered mental status and acute hypoxic/hypercapnic respiratory failure 06/19/15 and was transferred back to ICU from [place]. Was difficult intubation. Found to have MSSA HCAP and E coli UTI.  Assessment & Plan   Acute hypoxic/hypercapnic respiratory failure -s/p trach 06/27/2015 - doing well 06/29/15 on ATC - ongoing trach care per PCCM - downsizing or decannulation by PCCM will be done on 07/09/2015  Probable aspiration event 12/31 - Dysphagia s/p G tube -Was previously on dysphagia 2 diet with nectar thick liquids - per SLP pt is a silent aspirator - has been cleared for a D3 diet w/ nectar liquids - stopped tube feeds and following diet intake though PEG remains in place (clamped)  Acute encephalopathy  -Appears to have returned to baseline mental status   MSSA HCAP -Has completed a total of 11 days of abx tx - no symptoms of persisting infection  E coli UTI -Has completed abx course   RLL PE - R leg DVT - mid dec 2016 -Continue Xarelto   Anemia of critical illness -Hgb 12 -Monitor CBC  Vocal cord paresis -As per PCCM  Recent Lt MCA CVA -Cannot 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 Pending trach change by pulmonology - planned for 1/16..  Will likely be ready for discharge 07/09/2015 to home with home health services.  Time Spent in minutes   30 minutes  Procedures / significant events 12/14 CT chest > RLL PE 12/15 Doppler > DVT Rt common femoral vein  12/16 Laryngoscopy > vocal fold paresis/hypomobility 12/17 CT head > Lt MCA CVA 12/20 D/C from hospital - Admit to [place]  12/27 Readmit to hospital from [place] - transfer to ICU - intubated 12/27 CT head > ?acute Lt pontine infarct 12/27 EEG > diffuse slowing with focus over Lt posterior temporal region 12/28 MRI brain > no evidence of ischemia in pons > findings more likely wallerian degeneration  12/28 Neurology consulted - off pressors 12/30 self extubated 12/31 aspiration > reintubated 06/26/15 - Alert weaning 06/27/15 - s/p trach 06/27/2015 by DF  Consults   PCCM  DVT Prophylaxis  Xarelto  Lab Results  Component Value Date   PLT 251 07/08/2015  Medications  Scheduled Meds: . amLODipine  10 mg Oral Daily  . antiseptic oral rinse  7 mL Mouth Rinse q12n4p  . atorvastatin  40 mg Oral q1800  . carvedilol  6.25 mg Oral BID WC  . chlorhexidine  15 mL Mouth Rinse BID  . feeding supplement (ENSURE ENLIVE)  237 mL Oral Q24H  . folic acid  1 mg Oral Daily  . insulin aspart  0-15 Units Subcutaneous TID WC  . lisinopril  10 mg Oral Daily  . pantoprazole sodium  40 mg Oral Q24H  . rivaroxaban  20 mg Oral Q supper  . thiamine  100 mg Oral Daily   Continuous Infusions:  PRN Meds:.acetaminophen, fentaNYL (SUBLIMAZE) injection, hydrALAZINE, ondansetron, RESOURCE THICKENUP CLEAR  Antibiotics    Anti-infectives    Start     Dose/Rate Route Frequency Ordered Stop   06/25/15 1100  cefTRIAXone (ROCEPHIN) 1 g in dextrose 5 % 50 mL IVPB  Status:  Discontinued     1  g 100 mL/hr over 30 Minutes Intravenous Every 24 hours 06/25/15 1048 06/29/15 1825   06/23/15 1400  piperacillin-tazobactam (ZOSYN) IVPB 3.375 g  Status:  Discontinued     3.375 g 12.5 mL/hr over 240 Minutes Intravenous 3 times per day 06/23/15 0703 06/25/15 1048   06/23/15 0800  piperacillin-tazobactam (ZOSYN) IVPB 3.375 g     3.375 g 100 mL/hr over 30 Minutes Intravenous STAT 06/23/15 0703 06/23/15 0831   06/23/15 0800  vancomycin (VANCOCIN) IVPB 1000 mg/200 mL premix  Status:  Discontinued     1,000 mg 200 mL/hr over 60 Minutes Intravenous Every 8 hours 06/23/15 0703 06/25/15 1048   06/21/15 1400  ceFAZolin (ANCEF) IVPB 1 g/50 mL premix  Status:  Discontinued     1 g 100 mL/hr over 30 Minutes Intravenous 3 times per day 06/21/15 1111 06/23/15 0651   06/21/15 1200  levofloxacin (LEVAQUIN) IVPB 750 mg  Status:  Discontinued     750 mg 100 mL/hr over 90 Minutes Intravenous Every 24 hours 06/21/15 0854 06/21/15 1111   06/19/15 2200  vancomycin (VANCOCIN) IVPB 1000 mg/200 mL premix  Status:  Discontinued     1,000 mg 200 mL/hr over 60 Minutes Intravenous Every 12 hours 06/19/15 0921 06/21/15 0854   06/19/15 1000  cefTAZidime (FORTAZ) 1 g in dextrose 5 % 50 mL IVPB  Status:  Discontinued     1 g 100 mL/hr over 30 Minutes Intravenous Every 8 hours 06/19/15 0921 06/21/15 0854   06/19/15 1000  vancomycin (VANCOCIN) 1,500 mg in sodium chloride 0.9 % 500 mL IVPB     1,500 mg 250 mL/hr over 120 Minutes Intravenous  Once 06/19/15 [id number] 06/19/15 1521      Subjective:   Kerry [name] seen and examined today.  Limited communication, can mouth a few words and write, along with hand signals.  Denies pain, chest pain, shortness of breath, abdominal pain, N/V.   Objective:   Filed Vitals:   07/07/15 2344 07/08/15 0325 07/08/15 0436 07/08/15 0726  BP:   145/61   Pulse: 62 58 68   Temp:   98 F (36.7 C)   TempSrc:   Oral   Resp: 16 16 16    Height:      Weight:   75.841 kg (167 lb 3.2 oz)    SpO2: 100% 100% 99% 99%    Wt Readings from Last 3 Encounters:  07/08/15 75.841 kg (167 lb 3.2 oz)  06/14/15 76.5 kg (168 lb 10.4 oz)  06/08/15  80.1 kg (176 lb 9.4 oz)     Intake/Output Summary (Last 24 hours) at 07/08/15 1106 Last data filed at 07/08/15 0900  Gross per 24 hour  Intake    420 ml  Output      0 ml  Net    420 ml    Exam (no change)  General: Well developed, NAD  HEENT: NCAT,  mucous membranes moist.   Neck: Trach collar   Cardiovascular: S1 S2 auscultated,RRR, no murmurs  Respiratory: CTAB (anteriorly)  Abdomen: Soft, nontender, nondistended, + bowel sounds. peg  Extremities: warm dry without cyanosis clubbing or edema  Data Review   Micro Results No results found for this or any previous visit (from the past 240 hour(s)).  Radiology Reports Dg Chest 2 View  06/16/2015  CLINICAL DATA:  Breathing difficulty. EXAM: CHEST  2 VIEW COMPARISON:  06/05/2015 FINDINGS: Moderate cardiac enlargement. There is no pleural effusion or edema. The lungs appear clear. Spondylosis noted within the thoracic spine. IMPRESSION: 1. Cardiac enlargement. Electronically Signed   By: Kerby Moors M.D.   On: 06/16/2015 17:35   Ct Head Wo Contrast  06/19/2015  CLINICAL DATA:  Altered mental status.  Unresponsive.  Hypoxic. EXAM: CT HEAD WITHOUT CONTRAST TECHNIQUE: Contiguous axial images were obtained from the base of the skull through the vertex without intravenous contrast. COMPARISON:  06/08/2015 FINDINGS: There is no evidence of mass effect, midline shift or extra-axial fluid collections. There is no evidence of a space-occupying lesion or intracranial hemorrhage. There is no evidence of a cortical-based area of acute infarction. There is a small low-attenuation area in the left pons concerning for an acute left pontine infarct. There is a large chronic left MCA territory infarct. The ventricles and sulci are appropriate for the patient's age. The basal cisterns are patent.  Visualized portions of the orbits are unremarkable. The visualized portions of the paranasal sinuses and mastoid air cells are unremarkable. The osseous structures are unremarkable. IMPRESSION: 1. Small low-attenuation area in the left pons concerning for an acute left pontine infarct. 2. Old left MCA territory infarct. Electronically Signed   By: Kathreen Devoid   On: 06/19/2015 13:32   Ct Head Wo Contrast  06/09/2015  CLINICAL DATA:  Initial evaluation for deteriorating mental status. EXAM: CT HEAD WITHOUT CONTRAST TECHNIQUE: Contiguous axial images were obtained from the base of the skull through the vertex without intravenous contrast. COMPARISON:  Prior study from 04/16/2015. FINDINGS: There has been continued interval evolution of large volume left MCA territory infarct, overall stable in size and distribution as compared to previous study. There is increased volume loss and encephalomalacia within the area of infarction. No evidence for associated hemorrhage. Previously seen midline shift has essentially resolved. No new acute large vessel territory infarct. No intracranial hemorrhage. No mass lesion. No hydrocephalus. Basilar cisterns are patent. No extra-axial fluid collection. Mild chronic microvascular ischemic disease noted within the periventricular white matter. Scattered atheromatous plaque within the carotid siphons. Scalp soft tissues within normal limits. No acute abnormality about the orbits. Paranasal sinuses are clear.  No mastoid effusion. Calvarium intact. IMPRESSION: 1. Normal expected interval evolutional change of large left MCA territory infarct. Associated edema has largely resolved. 2. No new intracranial process. Electronically Signed   By: Jeannine Boga M.D.   On: 06/09/2015 00:47   Mr Brain Wo Contrast  06/21/2015  CLINICAL DATA:  Follow-up new pontine stroke. History of LEFT-sided stroke. EXAM: MRI HEAD WITHOUT CONTRAST TECHNIQUE: Multiplanar, multiecho pulse sequences of  the  brain and surrounding structures were obtained without intravenous contrast. COMPARISON:  CT head June 19, 2015 FINDINGS: Patchy T2 bright signal within the LEFT midbrain and pons corresponding to CT abnormality without superimposed reduced diffusion to suggest acute ischemia. Extensive reduced diffusion LEFT frontotemporal parietal lobes, predominately with T2 shine through. Patchy areas of low ADC value, however there is underlying susceptibility artifact and, this is likely spurious. LEFT basal ganglia and LEFT thalamus infarcts. Mild ex vacuo dilatation LEFT lateral ventricle. No hydrocephalus. No midline shift or mass effect. No mass lesions. No abnormal extra-axial fluid collection. Loss of LEFT internal carotid artery flow void from the skullbase. Probable retrograde flow as there is LEFT MCA and bilateral anterior cerebral artery flow voids. Ocular globes and orbital contents are normal. Small paranasal sinus air-fluid levels. Life-support lines in place. Moderate bilateral mastoid effusions. No abnormal sellar expansion. No cerebellar tonsillar ectopia. No suspicious calvarial bone marrow signal. IMPRESSION: No evidence of acute ischemia with particular attention to the pons. Old LEFT hemorrhagic MCA territory infarct. Chronically occluded LEFT internal carotid artery. T2 bright signal within the LEFT pons and midbrain, favoring early wallerian degeneration, less likely old ischemia. Electronically Signed   By: Elon Alas M.D.   On: 06/21/2015 04:07   Dg Chest Port 1 View  06/27/2015  CLINICAL DATA:  Acute respiratory failure.  Tracheostomy. EXAM: PORTABLE CHEST 1 VIEW COMPARISON:  06/26/2015 FINDINGS: Endotracheal tube has been rib moved. A tracheostomy tube has been placed and projects over the airway, terminating at the inferior margin of the clavicular heads. The cardiac silhouette remains mildly enlarged. Right basilar airspace opacity has increased. Mild left basilar opacity does not  appear significantly changed. No sizable pleural effusion or pneumothorax is identified. IMPRESSION: Increased right basilar airspace disease. Electronically Signed   By: Logan Bores M.D.   On: 06/27/2015 15:06   Dg Chest Port 1 View  06/26/2015  CLINICAL DATA:  Respiratory failure. Subsequent encounter. History of hypertension and diabetes. EXAM: PORTABLE CHEST 1 VIEW COMPARISON:  06/26/2015 at 6:11 a.m. FINDINGS: Interstitial opacities and patchy lung base airspace opacities have improved consistent with improved pulmonary edema. No new lung opacities. No convincing pleural effusion and no pneumothorax. Endotracheal tube now projects 1.7 cm above the carina. IMPRESSION: Significantly improved lung aeration since the earlier study consistent with improved pulmonary edema. No new abnormalities. Electronically Signed   By: Lajean Manes M.D.   On: 06/26/2015 12:21   Dg Chest Port 1 View  06/26/2015  CLINICAL DATA:  Hypoxia. EXAM: PORTABLE CHEST 1 VIEW COMPARISON:  June 25, 2015 FINDINGS: Endotracheal tube tip is 1.2 cm above the carina. No pneumothorax. There is interstitial edema in the lung base regions. There is no airspace consolidation. There is cardiomegaly. The pulmonary vascularity is normal. No adenopathy. No bone lesions. IMPRESSION: Bibasilar interstitial edema with mild cardiac enlargement. Suspect a degree of congestive heart failure. Currently there is no airspace consolidation appreciable. Endotracheal tube tip is 1.2 cm above the carina. No pneumothorax. It may be prudent to consider withdrawing endotracheal tube 2 to 3 cm. Electronically Signed   By: Lowella Grip III M.D.   On: 06/26/2015 07:53   Dg Chest Port 1 View  06/25/2015  CLINICAL DATA:  Respiratory distress. EXAM: PORTABLE CHEST 1 VIEW COMPARISON:  Earlier today at 712 hours FINDINGS: 1611 hours Endotracheal tube terminates 2.0 cm above carina.Patient rotated right. Cardiomegaly accentuated by AP portable technique. No pleural  effusion or pneumothorax. Low lung volumes with resultant pulmonary interstitial prominence. Persistent  right base airspace disease. Clear left lung. IMPRESSION: No change in right base airspace disease. Cardiomegaly and low lung volumes, without congestive failure. Electronically Signed   By: Abigail Miyamoto M.D.   On: 06/25/2015 17:05   Dg Chest Port 1 View  06/25/2015  CLINICAL DATA:  Respiratory failure. EXAM: PORTABLE CHEST 1 VIEW COMPARISON:  06/24/2015 and 06/23/2015 FINDINGS: Endotracheal tube in good position. Further clearing of the infiltrate in the right lung. Heart size and vascularity are now normal. Left lung is clear. No effusions. IMPRESSION: Further clearing of the infiltrate in the right lung with slight residual infiltrate at the base. Electronically Signed   By: Lorriane Shire M.D.   On: 06/25/2015 09:22   Dg Chest Port 1 View  06/24/2015  CLINICAL DATA:  Healthcare associated pneumonia. EXAM: PORTABLE CHEST 1 VIEW COMPARISON:  06/23/2015 FINDINGS: Go endotracheal tube is in place 2.6 cm above the carina. Heart size is upper limits normal. Patchy infiltrate is identified throughout the right lung and at the left lung base, slightly improved compared prior study. IMPRESSION: Persistent bilateral infiltrate showing slight interval improvement in aeration. Electronically Signed   By: Nolon Nations M.D.   On: 06/24/2015 08:58   Dg Chest Port 1 View  06/23/2015  CLINICAL DATA:  Endotracheal tube placement.  Initial encounter. EXAM: PORTABLE CHEST 1 VIEW COMPARISON:  Chest radiograph performed 06/22/2015 FINDINGS: The patient's endotracheal tube is seen ending 2-3 cm above the carina. New diffuse right-sided airspace opacification raises concern for pneumonia. No definite pleural effusion or pneumothorax is seen. Minimal left basilar airspace opacity is also seen. No pneumothorax identified The cardiomediastinal silhouette is borderline enlarged. No acute osseous abnormalities are seen.  IMPRESSION: 1. Endotracheal tube seen ending 2-3 cm above the carina. 2. Diffuse new right-sided airspace opacification raises concern for pneumonia. 3. Borderline cardiomegaly. Electronically Signed   By: Garald Balding M.D.   On: 06/23/2015 02:33   Dg Chest Port 1 View  06/22/2015  CLINICAL DATA:  60 year old female with Healthcare associated pneumonia. Subsegmental pulmonary emboli earlier this month. Initial encounter. EXAM: PORTABLE CHEST 1 VIEW COMPARISON:  06/21/2015 and earlier. FINDINGS: Portable AP semi upright view at 0617 hours. Stable endotracheal tube. Stable visualized enteric tube. Stable cardiac size and mediastinal contours. Lung volumes are within normal limits. Allowing for portable technique, the lungs are clear. No pneumothorax or pleural effusion. IMPRESSION: 1.  Stable lines and tubes. 2.  No acute cardiopulmonary abnormality. Electronically Signed   By: Genevie Ann M.D.   On: 06/22/2015 07:57   Dg Chest Port 1 View  06/21/2015  CLINICAL DATA:  Respiratory failure.  Followup exam. EXAM: PORTABLE CHEST 1 VIEW COMPARISON:  06/20/2015 FINDINGS: Endotracheal tube tip projects 2.9 cm above the carina, stable and well-positioned. Orogastric tube passes below the diaphragm into the stomach also stable. Cardiac silhouette there is normal in size and configuration. No mediastinal or hilar masses. There is persistent medial lung base opacity. This may reflect atelectasis or pneumonia, with atelectasis favored. No pulmonary edema. No evidence of pneumothorax. IMPRESSION: 1. Persistent medial lung base opacity most likely due to atelectasis. Pneumonia is possible. 2. Support apparatus well positioned. 3. No significant change from previous day's study allowing for differences in patient positioning and technique. Electronically Signed   By: Lajean Manes M.D.   On: 06/21/2015 08:16   Dg Chest Port 1 View  06/20/2015  CLINICAL DATA:  Hypoxia EXAM: PORTABLE CHEST 1 VIEW COMPARISON:  June 19, 2015 FINDINGS: Endotracheal tube tip is  3.8 cm above the carina. Nasogastric tube tip and side port are in the distal stomach region. No apparent pneumothorax. There is a small area of infiltrate in the right base. Lungs elsewhere clear. Heart is upper normal in size with pulmonary vascularity within normal limits. No adenopathy. IMPRESSION: Tube positions as described without pneumothorax. Small area of infiltrate right base. Lungs elsewhere clear. No change in cardiac silhouette. Electronically Signed   By: Lowella Grip III M.D.   On: 06/20/2015 07:30   Dg Chest Port 1 View  06/19/2015  CLINICAL DATA:  60 year old female status post adjustment of the endotracheal tube. EXAM: PORTABLE CHEST 1 VIEW COMPARISON:  Earlier chest radiograph 06/19/2015 FINDINGS: There has been interval retraction of the endotracheal tube with tip female approximately 5 cm above the carina. There has been interval removal of the enteric tube. No significant interval change in the appearance of the lungs. Pleural effusion or pneumothorax. Stable cardiac silhouette. The osseous structures appear unremarkable. IMPRESSION: Interval retraction of the endotracheal tube with tip now approximately 5 cm above the carina. Electronically Signed   By: Anner Crete M.D.   On: 06/19/2015 02:45   Dg Chest Port 1 View  06/19/2015  CLINICAL DATA:  Endotracheal tube repositioning.  Initial encounter. EXAM: PORTABLE CHEST 1 VIEW COMPARISON:  Chest radiograph performed earlier today at 1:23 a.m. FINDINGS: The patient's endotracheal tube is seen ending 1-2 cm above the carina. This could be retracted 1-2 cm, as deemed clinically appropriate. The enteric tube is seen extending below the diaphragm. Right basilar airspace opacity appears improved, and may reflect atelectasis or possibly mild pneumonia. No definite pleural effusion or pneumothorax is seen. The cardiomediastinal silhouette is normal in size. No acute osseous abnormalities are  identified. External pacing pads are noted. IMPRESSION: 1. Endotracheal tube seen ending 1-2 cm above the carina. This could be retracted 1-2 cm, as deemed clinically appropriate. 2. Right basilar airspace opacity appears improved, and may reflect atelectasis or possibly mild pneumonia. These results were called by telephone at the time of interpretation on 06/19/2015 at 1:58 am to [facility] on [facility], who verbally acknowledged these results. Electronically Signed   By: Garald Balding M.D.   On: 06/19/2015 02:04   Dg Chest Port 1 View  06/19/2015  CLINICAL DATA:  Endotracheal tube placement.  Initial encounter. EXAM: PORTABLE CHEST 1 VIEW COMPARISON:  None. FINDINGS: The patient's endotracheal tube is seen ending just above the carina. This should be retracted 2-3 cm. The enteric tube is noted extending below the diaphragm. The lungs are well-aerated. Right basilar airspace opacity may reflect atelectasis or pneumonia. A small right pleural effusion is suspected. No pneumothorax is seen. The cardiomediastinal silhouette is within normal limits. No acute osseous abnormalities are seen. External pacing pads are noted. IMPRESSION: 1. Endotracheal tube seen ending just above the carina. This should be retracted 3 cm. 2. Right basilar airspace opacity may reflect atelectasis or pneumonia. Small right pleural effusion suspected. These results were called by telephone at the time of interpretation on 06/19/2015 at 1:58 am to [facility] on [facility], who verbally acknowledged these results. Electronically Signed   By: Garald Balding M.D.   On: 06/19/2015 02:02   Dg Chest Port 1 View  06/19/2015  CLINICAL DATA:  60 year old female with shortness of breath EXAM: PORTABLE CHEST 1 VIEW COMPARISON:  Radiograph dated 06/18/2015 FINDINGS: The patient is tilted to the right. There is stable cardiomegaly. Bibasilar linear atelectatic changes/scarring noted. There is no focal consolidation, pleural effusion,  or pneumothorax. There  is degenerative changes of the spine. No acute fracture. IMPRESSION: Stable cardiomegaly.  No acute cardiopulmonary process. Electronically Signed   By: Anner Crete M.D.   On: 06/19/2015 00:41   Dg Chest Port 1 View  06/18/2015  CLINICAL DATA:  Acute onset of shortness of breath on exertion. Initial encounter. EXAM: PORTABLE CHEST 1 VIEW COMPARISON:  Chest radiograph performed 06/16/2015 FINDINGS: The lungs are well-aerated. Mild bibasilar atelectasis is noted. There is no evidence of pleural effusion or pneumothorax. The cardiomediastinal silhouette is enlarged. No acute osseous abnormalities are seen. IMPRESSION: Mild bibasilar atelectasis noted.  Cardiomegaly noted. Electronically Signed   By: Garald Balding M.D.   On: 06/18/2015 04:44   Dg Abd Portable 1v  06/19/2015  CLINICAL DATA:  Evaluate orogastric tube placement EXAM: PORTABLE ABDOMEN - 1 VIEW COMPARISON:  06/19/15, 06/04/15 FINDINGS: OG tube projects over the stomach with tip over the anticipated position of the antrum. PEG tube also projects over the stomach. Oral contrast noted throughout the large bowel. 28mm radiodensity projects over the left abdomen over a loop of large bowel and is not seen on 06/04/15. IMPRESSION: OG tube as described. Abnormal density could represent foreign body either within the abdominal cavity or on the body wall, as well as possibly ingested material. Electronically Signed   By: Skipper Cliche M.D.   On: 06/19/2015 11:18   Dg Abd Portable 1v  06/19/2015  CLINICAL DATA:  Orogastric tube placement.  Initial encounter. EXAM: PORTABLE ABDOMEN - 1 VIEW COMPARISON:  Abdominal radiograph performed 06/04/2015 FINDINGS: The patient's enteric tube is noted ending overlying the body of the stomach. The endotracheal tube is noted ending just above the carina. This should be retracted 2-3 cm. The visualized bowel gas pattern is difficult to fully assess due to motion artifact, though contrast is seen within the colon.  Right basilar airspace opacity is noted. No pleural effusion is seen. No acute osseous abnormalities are identified. External pacing pads are noted. IMPRESSION: 1. Enteric tube noted ending overlying the body of the stomach. 2. Endotracheal tube seen ending just above the carina. This should be retracted 2-3 cm. 3. Right basilar airspace opacity noted. These results were called by telephone at the time of interpretation on 06/19/2015 at 1:58 am to [facility] on [facility], who verbally acknowledged these results. Electronically Signed   By: Garald Balding M.D.   On: 06/19/2015 02:00   Dg Swallowing Func-speech Pathology  07/02/2015  Objective Swallowing Evaluation:   Patient Details Name: Kerry [name] MRN: [id number] Date of Birth: Jul 03, 1955 Today's Date: 07/02/2015 Time: SLP Start Time (ACUTE ONLY): 1040-SLP Stop Time (ACUTE ONLY): 1100 SLP Time Calculation (min) (ACUTE ONLY): 20 min Past Medical History: @PMH @ Past Surgical History: Past Surgical History Procedure Laterality Date . Cardiac surgery   . Appendectomy   . Breast surgery   . Mastectomy Left 03/2007 . Cardiac catheterization  2005 . Vein surgery Right 2005 . Tubal ligation Right  . Radiology with anesthesia N/A 04/11/2015   Procedure: RADIOLOGY WITH ANESTHESIA;  Surgeon: Luanne Bras, MD;  Location: [place];  Service: Radiology;  Laterality: N/A; . Cardiac catheterization N/A 04/13/2015   Procedure: Temporary Pacemaker;  Surgeon: Peter M Martinique, MD;  Location: [place] [facility];  Service: Cardiovascular;  Laterality: N/A; . Peg placement N/A 04/18/2015   Procedure: PERCUTANEOUS ENDOSCOPIC GASTROSTOMY (PEG) PLACEMENT;  Surgeon: Judeth Horn, MD;  Location: [place];  Service: General;  Laterality: N/A;  bedside . 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 She was sent to an LTAC and then CIR. She had PEG and trach 10/26 (has since been decannulated). She had RLL PE on 06/07/15. She developed altered mental status and acute hypoxic/hypercapnic respiratory failure 06/19/15 and was transferred back to ICU from [place]. CT of the head with ? acute left pontine infarct. Was difficult intubation. s/p tracheostomy 1/4. CCM notes vocal cord paresis/hypomobility per laryngoscopy 12/16. Found to have MSSA HCAP and E coli UTI. Subjective: pt alert, aphasic, inspiratory stridor Assessment / Plan / Recommendation CHL IP CLINICAL IMPRESSIONS 07/02/2015 Therapy Diagnosis Moderate pharyngeal phase dysphagia Clinical Impression Pt presents with a primary sensory based oropharygneal dysphagia. Pt tested with and without PMSV (see treatment note), no change in function. With liquid boluses pt has an intermittent delay in swallow initiation. With thin liquids this delay occurs with the initial sips resulting in silent aspiration before the swallow; pt eventually responds with a cough when aspirate fully descends trachea. Pt consistently protected airway consuming nectar, but did have some delay with an independent second swallow to fully clear trace lingual residual. Pts function with nectar was adequate even with large consecutive straw sips. Also trialed a chin tuck with thin liquids with great success, no penetration with consecutive straw sips; can be trialed in therapy. Recommend Dys 3 (mechanical soft) diet with nectar thick liquids will follow for tolerance.  Impact on safety and function Mild aspiration risk   CHL IP TREATMENT RECOMMENDATION 07/02/2015 Treatment Recommendations Therapy as outlined in treatment plan below   Prognosis 07/02/2015 Prognosis for Safe Diet Advancement Good Barriers to Reach Goals Language deficits Barriers/Prognosis Comment -- CHL IP DIET RECOMMENDATION 07/02/2015 SLP Diet Recommendations Dysphagia 3 (Mech soft) solids;Nectar thick  liquid Liquid Administration via Cup;Straw Medication Administration Whole meds with puree Compensations Slow rate;Minimize environmental distractions Postural Changes Seated upright at 90 degrees   CHL IP OTHER RECOMMENDATIONS 07/02/2015 Recommended Consults -- Oral Care Recommendations Oral care BID Other Recommendations Order thickener from pharmacy   CHL IP FOLLOW UP RECOMMENDATIONS 07/02/2015 Follow up Recommendations Inpatient Rehab   CHL IP FREQUENCY AND DURATION 07/02/2015 Speech Therapy Frequency (ACUTE ONLY) min 2x/week Treatment Duration 2 weeks      CHL IP ORAL PHASE 07/02/2015 Oral Phase WFL Oral - Pudding Teaspoon -- Oral - Pudding Cup -- Oral - Honey Teaspoon -- Oral - Honey Cup NT Oral - Nectar Teaspoon -- Oral - Nectar Cup WFL Oral - Nectar Straw -- Oral - Thin Teaspoon -- Oral - Thin Cup WFL Oral - Thin Straw WFL Oral - Puree WFL Oral - Mech Soft -- Oral - Regular WFL Oral - Multi-Consistency -- Oral - Pill -- Oral Phase - Comment --  CHL IP PHARYNGEAL PHASE 07/02/2015 Pharyngeal Phase Impaired Pharyngeal- Pudding Teaspoon -- Pharyngeal -- Pharyngeal- Pudding Cup -- Pharyngeal -- Pharyngeal- Honey Teaspoon -- Pharyngeal -- Pharyngeal- Honey Cup NT Pharyngeal -- Pharyngeal- Nectar Teaspoon -- Pharyngeal -- Pharyngeal- Nectar Cup Delayed swallow initiation-pyriform sinuses Pharyngeal Material does not enter airway Pharyngeal- Nectar Straw Delayed swallow initiation-pyriform sinuses Pharyngeal -- Pharyngeal- Thin Teaspoon -- Pharyngeal -- Pharyngeal- Thin Cup Delayed swallow initiation-pyriform sinuses;Penetration/Aspiration before swallow Pharyngeal Material enters airway, passes BELOW cords without attempt by patient to eject out (silent aspiration) Pharyngeal- Thin Straw WFL;Compensatory strategies attempted (with notebox) Pharyngeal Material does not enter airway Pharyngeal- Puree  WFL Pharyngeal -- Pharyngeal- Mechanical Soft -- Pharyngeal -- Pharyngeal- Regular WFL Pharyngeal -- Pharyngeal-  Multi-consistency -- Pharyngeal -- Pharyngeal- Pill WFL Pharyngeal -- Pharyngeal Comment --  CHL IP CERVICAL ESOPHAGEAL PHASE 07/02/2015 Cervical Esophageal Phase WFL Pudding Teaspoon -- Pudding Cup -- Honey Teaspoon -- Honey Cup -- Nectar Teaspoon -- Nectar Cup -- Nectar Straw -- Thin Teaspoon -- Thin Cup -- Thin Straw -- Puree -- Mechanical Soft -- Regular -- Multi-consistency -- Pill -- Cervical Esophageal Comment -- Herbie Baltimore, MA CCC-SLP [phone] Lynann Beaver 07/02/2015, 11:26 AM              Dg Swallowing Func-speech Pathology  06/15/2015  Objective Swallowing Evaluation:   Patient Details Name: Kerry [name] MRN: [id number] Date of Birth: 04/28/1956 Today's Date: 06/15/2015 Time: SLP Start Time: 0855-SLP Stop Time : 0922 SLP Time Calculation (min) : 27 min Past Medical History: Past Medical History Diagnosis Date . Hypertension  . Coronary artery disease  . Diabetes mellitus  . Breast cancer ([place]) 03/10/2007   Left breat . Degenerative disc disease, cervical  . Degenerative disc disease, lumbar  . Fibromyalgia  . Hyperlipidemia  . Carpal tunnel syndrome on right  . PE (pulmonary embolism)  . UTI (lower urinary tract infection)  . GIB (gastrointestinal bleeding)  . Elevated troponin  . HTN (hypertension) 06/13/2015 Past Surgical History: Past Surgical History Procedure Laterality Date . Cardiac surgery   . Appendectomy   . Breast surgery   . Mastectomy Left 03/2007 . Cardiac catheterization  2005 . Vein surgery Right 2005 . Tubal ligation Right  . Radiology with anesthesia N/A 04/11/2015   Procedure: RADIOLOGY WITH ANESTHESIA;  Surgeon: Luanne Bras, MD;  Location: [place];  Service: Radiology;  Laterality: N/A; . Cardiac catheterization N/A 04/13/2015   Procedure: Temporary Pacemaker;  Surgeon: Peter M Martinique, MD;  Location: [place] [facility];  Service: Cardiovascular;  Laterality: N/A; . Peg placement N/A 04/18/2015   Procedure: PERCUTANEOUS ENDOSCOPIC GASTROSTOMY (PEG) PLACEMENT;   Surgeon: Judeth Horn, MD;  Location: [place];  Service: General;  Laterality: N/A;  bedside . Esophagogastroduodenoscopy N/A 04/18/2015   Procedure: ESOPHAGOGASTRODUODENOSCOPY (EGD);  Surgeon: Judeth Horn, MD;  Location: [facility] ENDOSCOPY;  Service: General;  Laterality: N/A; HPI: pt presents with dense L MCA Infarct involving L Temporal, Frontoparietal, and L Basal Ganglia regions and was started on TPA, but unable to complete full dose due to hypertension, but did recieve IR intervention. Pt extubated 10/30. pt with hx of DM, HTN, Breast CA s/p Mastectomy, and Fibromyalgia. Patient transferred to [facility] and then Inpatient Rehab. Transferred back to acute on 06/07/15 due to PE. Readmitted to CIR on 06/13/2015 for completion of family education prior to discharge home.  Assessment / Plan / Recommendation CHL IP CLINICAL IMPRESSIONS 06/15/2015 Therapy Diagnosis Mild oral phase dysphagia;Moderate pharyngeal phase dysphagia Clinical Impression Pt presents with slight decline in swallowing function in comparison to previous MBS, suspect due to deconditioning s/p acute treatment for PE.  Overall pt remains with a mild oral phase dysphagia and moderate pharyngeal phase dysphagia with both sensory and motor components.  Oral phase deficits again are characterized by right sided labial and lingual weakness which results in weakened manipulation and prolonged oral transit of boluses to the oropharynx.  Decreased pharyngeal sensation and decreased base of tongue retraction also resulted in premature spillage of materials into the pharynx and delayed swallow initiation.  Swallow response was delayed to the vallecula with thickened liquids and solids, and further delayed to  the pyriforms with thin liquids.  Due to delayed swallow initiation, pt exhibited deep, silent penetration of nectar thick liquids on initial cup sip and again when challenged with larger boluses; however, smaller boluses were  tolerated without airway compromise.  Post swallow, moderate vallecular residuals were noted with thickened liquids and solids and mild-moderate pyriform residue with thin liquids due to pharyngeal weakness.  Consistent deep penetration during the swallow and eventual unsensed aspiration of residuals after the swallow was visualized with thin liquids, even when boluses were strictly controlled via teaspoon.  Suspect increase in frequency and severity of airway compromise in comparison to previous study to be related to increased respiratory rate and effort required for breathing.  Pt continues to spontaneously use extra swallows which, in combination with SLP interventions for alternating solids and liquids effectively minimized pharyngeal residue.  Recommend that pt remain on dys 2 textures and nectar thick liquids with full supervision for use of swallowing precautions.  Do not recommend continuing with water protocol at this time due to pt's likely diminished functional reserve.  Prognosis for advancement fair with trials of advanced consistencies, SLP follow up for use of safe swallowing strategies, and repeat objective swallow study.   Impact on safety and function Moderate aspiration risk   CHL IP TREATMENT RECOMMENDATION 06/12/2015 Treatment Recommendations Therapy as outlined in treatment plan below   Prognosis 06/15/2015 Prognosis for Safe Diet Advancement Fair Barriers to Reach Goals Cognitive deficits;Language deficits;Motivation Barriers/Prognosis Comment -- CHL IP DIET RECOMMENDATION 06/15/2015 SLP Diet Recommendations Dysphagia 2 (Fine chop) solids;Nectar thick liquid Liquid Administration via Cup Medication Administration Other (Comment) Compensations Slow rate;Small sips/bites;Minimize environmental distractions;Multiple dry swallows after each bite/sip;Follow solids with liquid Postural Changes Seated upright at 90 degrees            CHL IP ORAL PHASE 06/15/2015 Oral Phase Impaired Oral - Pudding  Teaspoon -- Oral - Pudding Cup -- Oral - Honey Teaspoon -- Oral - Honey Cup -- Oral - Nectar Teaspoon -- Oral - Nectar Cup Reduced posterior propulsion;Lingual/palatal residue;Piecemeal swallowing;Decreased bolus cohesion;Delayed oral transit;Premature spillage Oral - Nectar Straw -- Oral - Thin Teaspoon -- Oral - Thin Cup Weak lingual manipulation;Reduced posterior propulsion;Lingual/palatal residue;Piecemeal swallowing;Delayed oral transit;Decreased bolus cohesion;Premature spillage Oral - Thin Straw -- Oral - Puree Weak lingual manipulation;Reduced posterior propulsion;Lingual/palatal residue;Premature spillage;Delayed oral transit Oral - Mech Soft -- Oral - Regular Weak lingual manipulation;Reduced posterior propulsion;Lingual/palatal residue;Piecemeal swallowing;Impaired mastication;Delayed oral transit;Decreased bolus cohesion;Premature spillage Oral - Multi-Consistency -- Oral - Pill -- Oral Phase - Comment --  CHL IP PHARYNGEAL PHASE 06/15/2015 Pharyngeal Phase -- Pharyngeal- Pudding Teaspoon -- Pharyngeal -- Pharyngeal- Pudding Cup -- Pharyngeal -- Pharyngeal- Honey Teaspoon -- Pharyngeal -- Pharyngeal- Honey Cup -- Pharyngeal -- Pharyngeal- Nectar Teaspoon -- Pharyngeal -- Pharyngeal- Nectar Cup Delayed swallow initiation-vallecula;Delayed swallow initiation-pyriform sinuses;Reduced laryngeal elevation;Reduced airway/laryngeal closure;Reduced tongue base retraction;Penetration/Aspiration during swallow;Pharyngeal residue - valleculae Pharyngeal Material enters airway, CONTACTS cords and not ejected out Pharyngeal- Nectar Straw -- Pharyngeal -- Pharyngeal- Thin Teaspoon -- Pharyngeal -- Pharyngeal- Thin Cup Reduced airway/laryngeal closure;Reduced laryngeal elevation;Delayed swallow initiation-pyriform sinuses;Reduced tongue base retraction;Penetration/Aspiration during swallow;Penetration/Apiration after swallow;Pharyngeal residue - pyriform;Pharyngeal residue - valleculae Pharyngeal Material enters  airway, passes BELOW cords without attempt by patient to eject out (silent aspiration) Pharyngeal- Thin Straw -- Pharyngeal -- Pharyngeal- Puree Delayed swallow initiation-vallecula;Pharyngeal residue - valleculae;Reduced laryngeal elevation;Reduced airway/laryngeal closure;Reduced tongue base retraction Pharyngeal -- Pharyngeal- Mechanical Soft -- Pharyngeal -- Pharyngeal- Regular Delayed swallow initiation-vallecula;Reduced tongue base retraction;Pharyngeal residue - valleculae;Reduced laryngeal elevation Pharyngeal -- Pharyngeal-  Multi-consistency -- Pharyngeal -- Pharyngeal- Pill -- Pharyngeal -- Pharyngeal Comment --  No flowsheet data found. Page, Selinda Orion 06/15/2015, 12:30 PM               CBC  Recent Labs Lab 07/03/15 0525 07/06/15 0419 07/08/15 0248  WBC 8.8 8.0 7.4  HGB 10.8* 11.6* 12.0  HCT 34.3* 37.6 37.8  PLT 265 268 251  MCV 90.7 90.4 90.2  MCH 28.6 27.9 28.6  MCHC 31.5 30.9 31.7  RDW 15.9* 15.4 14.9    Chemistries   Recent Labs Lab 07/03/15 0525 07/06/15 0419 07/08/15 0248  NA 142 142 142  K 3.5 3.7 3.6  CL 103 105 104  CO2 28 28 30   GLUCOSE 136* 149* 122*  BUN 10 10 10   CREATININE 0.47 0.47 0.48  CALCIUM 9.2 9.5 9.6  AST 20  --   --   ALT 31  --   --   ALKPHOS 46  --   --   BILITOT 0.5  --   --    ------------------------------------------------------------------------------------------------------------------ estimated creatinine clearance is 79.7 mL/min (by C-G formula based on Cr of 0.48). ------------------------------------------------------------------------------------------------------------------ No results for input(s): HGBA1C in the last 72 hours. ------------------------------------------------------------------------------------------------------------------ No results for input(s): CHOL, HDL, LDLCALC, TRIG, CHOLHDL, LDLDIRECT in the last 72  hours. ------------------------------------------------------------------------------------------------------------------ No results for input(s): TSH, T4TOTAL, T3FREE, THYROIDAB in the last 72 hours.  Invalid input(s): FREET3 ------------------------------------------------------------------------------------------------------------------ No results for input(s): VITAMINB12, FOLATE, FERRITIN, TIBC, IRON, RETICCTPCT in the last 72 hours.  Coagulation profile No results for input(s): INR, PROTIME in the last 168 hours.  No results for input(s): DDIMER in the last 72 hours.  Cardiac Enzymes No results for input(s): CKMB, TROPONINI, MYOGLOBIN in the last 168 hours.  Invalid input(s): CK ------------------------------------------------------------------------------------------------------------------ Invalid input(s): POCBNP    Shaylie Eklund D.O. on 07/08/2015 at 11:06 AM  Between 7am to 7pm - Pager - [phone]  After 7pm go to [email or web address] - password [email or web address]  And look for the night coverage person covering for me after hours  [facility] Office  [phone]

## 2015-07-09 DIAGNOSIS — G9340 Encephalopathy, unspecified: Secondary | ICD-10-CM

## 2015-07-09 LAB — GLUCOSE, CAPILLARY
GLUCOSE-CAPILLARY: 153 mg/dL — AB (ref 65–99)
Glucose-Capillary: 165 mg/dL — ABNORMAL HIGH (ref 65–99)

## 2015-07-09 MED ORDER — LISINOPRIL 10 MG PO TABS: 10.0000 mg | ORAL_TABLET | Freq: Every day | ORAL | Status: AC

## 2015-07-09 MED ORDER — AMLODIPINE BESYLATE 10 MG PO TABS: 10.0000 mg | ORAL_TABLET | Freq: Every day | ORAL | Status: AC

## 2015-07-09 MED ORDER — PANTOPRAZOLE SODIUM 40 MG PO PACK: 40.0000 mg | PACK | ORAL | Status: DC

## 2015-07-09 MED ORDER — INSULIN GLARGINE 100 UNIT/ML SOLOSTAR PEN: 5.0000 [IU] | PEN_INJECTOR | Freq: Every day | SUBCUTANEOUS | Status: DC

## 2015-07-09 MED ORDER — RIVAROXABAN 20 MG PO TABS: 20.0000 mg | ORAL_TABLET | Freq: Every day | ORAL | Status: AC

## 2015-07-09 MED ORDER — GLIPIZIDE 5 MG PO TABS: 5.0000 mg | ORAL_TABLET | Freq: Every day | ORAL | Status: DC

## 2015-07-09 MED ORDER — CARVEDILOL 12.5 MG PO TABS: 12.5000 mg | ORAL_TABLET | Freq: Two times a day (BID) | ORAL | Status: AC

## 2015-07-09 MED ORDER — ATORVASTATIN CALCIUM 40 MG PO TABS: 40.0000 mg | ORAL_TABLET | Freq: Every day | ORAL | Status: DC

## 2015-07-09 MED ORDER — ENSURE PUDDING PO PUDG: 1.0000 | Freq: Three times a day (TID) | ORAL | Status: DC

## 2015-07-09 MED ORDER — INSULIN PEN NEEDLE 32G X 4 MM MISC: Status: AC

## 2015-07-09 NOTE — Procedures (Signed)
Tracheostomy tube change: Verbal timeout was performed prior to the procedure. The old  #6 cuffed trach was carefully removed. the tracheostomy site appeared: clean. A new #  Cuffless trach was easily placed in the tracheostomy stoma and secured with velcro trach ties. The tracheostomy was patent, good color change observed via EZ-CAP, and the patient  Is unable to voice with finger occlusion ? D/t her vocal cord paresis vs stenosis. Tolerated the procedure well with no immediate complications. Could consider change to #4 trach in future.   Erick Colace ACNP-BC [place] Pager # [phone] OR # [phone] if no answer

## 2015-07-09 NOTE — Progress Notes (Signed)
Orders received for pt discharge.  Discharge summary printed and reviewed with pt.  Explained medication regimen, and pt had no further questions at this time.  IV removed and site remains clean, dry, intact.  Telemetry removed.  Pt in stable condition and awaiting transport. 

## 2015-07-09 NOTE — Discharge Summary (Signed)
Physician Discharge Summary  DENYM INGS [id number] DOB: [birth year] DOA: 06/19/2015  PCP: [name], MD  Admit date: 06/19/2015 Discharge date: 07/09/2015  Time spent: 45 minutes  Recommendations for Outpatient Follow-up:  Patient will be discharged to home with home health services.  Patient will need to follow up with primary care provider within one week of discharge.  Patient will need to follow up with the [facility].  Patient should continue medications as prescribed.  Patient should follow a dysphagia 3 diet.   Discharge Diagnoses:  Acute hypoxic/hypercapnic respiratory failure Probable aspiration event, dysphagia Acute encephalopathy MSSA pneumonia Escherichia coli UTI RLL PE/R leg DVT Anemia of chronic disease Vocal cord paresis Recent CVA History of coronary artery disease Hypertension Hyperlipidemia Bradycardia Hypokalemia Hypomagnesemia Diabetes  Discharge Condition: Stable  Diet recommendation: Dysphagia 3 Diet   Filed Weights   07/07/15 0602 07/08/15 0436 07/09/15 0414  Weight: 75.342 kg (166 lb 1.6 oz) 75.841 kg (167 lb 3.2 oz) 74.345 kg (163 lb 14.4 oz)    History of present illness:  60 yo female admitted to [facility] 04/11/15 with 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 She was sent to an LTAC and then CIR. She had RLL PE on 06/07/15. She developed altered mental status and acute hypoxic/hypercapnic respiratory failure 06/19/15 and was transferred back to ICU from [place]. Was difficult intubation. Found to have MSSA HCAP and E coli UTI.   Hospital Course:  Acute hypoxic/hypercapnic respiratory failure -s/p trach 06/27/2015 - doing well 06/29/15 on ATC - ongoing trach care per PCCM - downsizing or decannulation by PCCM will be done on 07/09/2015  Probable aspiration event 12/31 - Dysphagia s/p G tube -Was previously on dysphagia 2 diet with nectar thick liquids - per SLP pt is a silent aspirator - has been cleared for a D3 diet w/ nectar  liquids - stopped tube feeds and following diet intake though PEG remains in place (clamped)  Acute encephalopathy  -Appears to have returned to baseline mental status   MSSA HCAP -Has completed a total of 11 days of abx tx - no symptoms of persisting infection  E coli UTI -Has completed abx course   RLL PE - R leg DVT - mid dec 2016 -Continue Xarelto   Anemia of critical illness -Hgb 12 -Monitor CBC  Vocal cord paresis -As per PCCM  Recent Lt MCA CVA -Cannot return to CIR -Patient's family would like to take her home with home health services  Hx of CAD  HTN -Continue amlodipine, lisinopril, and coreg  HLD -Continue statin  Bradycardia -Resolved  Hypokalemia -Resolved  Hypomagnesemia -Resolved  DM -CBG reasonably controlled -Continue ISS and CBG monitoring  Procedures / significant events 12/14 CT chest > RLL PE 12/15 Doppler > DVT Rt common femoral vein  12/16 Laryngoscopy > vocal fold paresis/hypomobility 12/17 CT head > Lt MCA CVA 12/20 D/C from hospital - Admit to [place]  12/27 Readmit to hospital from [place] - transfer to ICU - intubated 12/27 CT head > ?acute Lt pontine infarct 12/27 EEG > diffuse slowing with focus over Lt posterior temporal region 12/28 MRI brain > no evidence of ischemia in pons > findings more likely wallerian degeneration  12/28 Neurology consulted - off pressors 12/30 self extubated 12/31 aspiration > reintubated 06/26/15 - Alert weaning 06/27/15 - s/p trach 06/27/2015 by DF  Consults  PCCM  Discharge Exam: Filed Vitals:   07/09/15 0733 07/09/15 0859  BP:  128/60  Pulse: 70 74  Temp:  Resp: 16    Exam   General: Well developed, NAD  HEENT: NCAT, mucous membranes moist.   Neck: Trach collar  Cardiovascular: S1 S2 auscultated,RRR, no murmurs  Respiratory: CTAB (anteriorly)  Abdomen: Soft, nontender, nondistended, + bowel sounds. peg  Extremities: warm dry without cyanosis clubbing or  edema  Discharge Instructions      Discharge Instructions    Discharge instructions    Complete by:  As directed   Patient will be discharged to home with home health services.  Patient will need to follow up with primary care provider within one week of discharge.  Patient will need [facility] up with [facility].  Patient should continue medications as prescribed.  Patient should follow a dysphagia 3 diet.            Medication List    STOP taking these medications        free water Soln      TAKE these medications        acetaminophen 160 MG/5ML solution  Commonly known as:  TYLENOL  Take 10.2-20.3 mLs (325-650 mg total) by mouth every 4 (four) hours as needed for mild pain.     amLODipine 10 MG tablet  Commonly known as:  NORVASC  Take 1 tablet (10 mg total) by mouth daily.     atorvastatin 40 MG tablet  Commonly known as:  LIPITOR  Take 1 tablet (40 mg total) by mouth daily at 6 PM.     carvedilol 12.5 MG tablet  Commonly known as:  COREG  Take 1 tablet (12.5 mg total) by mouth 2 (two) times daily with a meal.     feeding supplement (ENSURE) Pudg  Take 1 Container by mouth 3 (three) times daily between meals.     folic acid 1 MG tablet  Commonly known as:  FOLVITE  Take 1 mg by mouth daily.     glipiZIDE 5 MG tablet  Commonly known as:  GLUCOTROL  Take by mouth daily before breakfast.     hydrocerin Crea  Apply 1 application topically 2 (two) times daily. To bilateral feet     Insulin Glargine 100 UNIT/ML Solostar Pen  Commonly known as:  LANTUS  Inject 5 Units into the skin daily at 10 pm.     Insulin Pen Needle 32G X 4 MM Misc  Use with insulin pen to dispense insulin daily     lisinopril 10 MG tablet  Commonly known as:  PRINIVIL,ZESTRIL  Take 1 tablet (10 mg total) by mouth daily.     Melatonin 3 MG Tabs  Take 6 mg by mouth at bedtime.     multivitamins ther. w/minerals Tabs tablet  Take 1 tablet by mouth daily.     pantoprazole  sodium 40 mg/20 mL Pack  Commonly known as:  PROTONIX  Take 20 mLs (40 mg total) by mouth daily.     rivaroxaban 20 MG Tabs tablet  Commonly known as:  XARELTO  Take 1 tablet (20 mg total) by mouth daily with supper.     SENNA PLUS 8.6-50 MG tablet  Generic drug:  senna-docusate  Take 1 tablet by mouth 2 (two) times daily as needed for mild constipation.     sodium chloride 0.65 % Soln nasal spray  Commonly known as:  OCEAN  Place 1 spray into both nostrils 3 (three) times daily. To help with nose bleeds     thiamine 100 MG tablet  Take 100 mg by mouth daily.  zinc sulfate 220 MG capsule  Take 220 mg by mouth daily at 6 PM.       Allergies  Allergen Reactions  . Codeine Nausea Only    Nausea?-- "made her sick"  . Oxycodone Nausea Only    Sick   . Oxycontin [Oxycodone Hcl] Nausea Only    Sick    Follow-up Information    Follow up with [name], MD. Schedule an appointment as soon as possible for a visit in 1 week.   Specialty:  Internal Medicine   Why:  Hospital follow up   Contact information:   [place] [address] [phone]        The results of significant diagnostics from this hospitalization (including imaging, microbiology, ancillary and laboratory) are listed below for reference.    Significant Diagnostic Studies: Dg Chest 2 View  06/16/2015  CLINICAL DATA:  Breathing difficulty. EXAM: CHEST  2 VIEW COMPARISON:  06/05/2015 FINDINGS: Moderate cardiac enlargement. There is no pleural effusion or edema. The lungs appear clear. Spondylosis noted within the thoracic spine. IMPRESSION: 1. Cardiac enlargement. Electronically Signed   By: Kerby Moors M.D.   On: 06/16/2015 17:35   Ct Head Wo Contrast  06/19/2015  CLINICAL DATA:  Altered mental status.  Unresponsive.  Hypoxic. EXAM: CT HEAD WITHOUT CONTRAST TECHNIQUE: Contiguous axial images were obtained from the base of the skull through the vertex without intravenous  contrast. COMPARISON:  06/08/2015 FINDINGS: There is no evidence of mass effect, midline shift or extra-axial fluid collections. There is no evidence of a space-occupying lesion or intracranial hemorrhage. There is no evidence of a cortical-based area of acute infarction. There is a small low-attenuation area in the left pons concerning for an acute left pontine infarct. There is a large chronic left MCA territory infarct. The ventricles and sulci are appropriate for the patient's age. The basal cisterns are patent. Visualized portions of the orbits are unremarkable. The visualized portions of the paranasal sinuses and mastoid air cells are unremarkable. The osseous structures are unremarkable. IMPRESSION: 1. Small low-attenuation area in the left pons concerning for an acute left pontine infarct. 2. Old left MCA territory infarct. Electronically Signed   By: Kathreen Devoid   On: 06/19/2015 13:32   Mr Brain Wo Contrast  06/21/2015  CLINICAL DATA:  Follow-up new pontine stroke. History of LEFT-sided stroke. EXAM: MRI HEAD WITHOUT CONTRAST TECHNIQUE: Multiplanar, multiecho pulse sequences of the brain and surrounding structures were obtained without intravenous contrast. COMPARISON:  CT head June 19, 2015 FINDINGS: Patchy T2 bright signal within the LEFT midbrain and pons corresponding to CT abnormality without superimposed reduced diffusion to suggest acute ischemia. Extensive reduced diffusion LEFT frontotemporal parietal lobes, predominately with T2 shine through. Patchy areas of low ADC value, however there is underlying susceptibility artifact and, this is likely spurious. LEFT basal ganglia and LEFT thalamus infarcts. Mild ex vacuo dilatation LEFT lateral ventricle. No hydrocephalus. No midline shift or mass effect. No mass lesions. No abnormal extra-axial fluid collection. Loss of LEFT internal carotid artery flow void from the skullbase. Probable retrograde flow as there is LEFT MCA and bilateral  anterior cerebral artery flow voids. Ocular globes and orbital contents are normal. Small paranasal sinus air-fluid levels. Life-support lines in place. Moderate bilateral mastoid effusions. No abnormal sellar expansion. No cerebellar tonsillar ectopia. No suspicious calvarial bone marrow signal. IMPRESSION: No evidence of acute ischemia with particular attention to the pons. Old LEFT hemorrhagic MCA territory infarct. Chronically occluded LEFT internal carotid  artery. T2 bright signal within the LEFT pons and midbrain, favoring early wallerian degeneration, less likely old ischemia. Electronically Signed   By: Elon Alas M.D.   On: 06/21/2015 04:07   Dg Chest Port 1 View  06/27/2015  CLINICAL DATA:  Acute respiratory failure.  Tracheostomy. EXAM: PORTABLE CHEST 1 VIEW COMPARISON:  06/26/2015 FINDINGS: Endotracheal tube has been rib moved. A tracheostomy tube has been placed and projects over the airway, terminating at the inferior margin of the clavicular heads. The cardiac silhouette remains mildly enlarged. Right basilar airspace opacity has increased. Mild left basilar opacity does not appear significantly changed. No sizable pleural effusion or pneumothorax is identified. IMPRESSION: Increased right basilar airspace disease. Electronically Signed   By: Logan Bores M.D.   On: 06/27/2015 15:06   Dg Chest Port 1 View  06/26/2015  CLINICAL DATA:  Respiratory failure. Subsequent encounter. History of hypertension and diabetes. EXAM: PORTABLE CHEST 1 VIEW COMPARISON:  06/26/2015 at 6:11 a.m. FINDINGS: Interstitial opacities and patchy lung base airspace opacities have improved consistent with improved pulmonary edema. No new lung opacities. No convincing pleural effusion and no pneumothorax. Endotracheal tube now projects 1.7 cm above the carina. IMPRESSION: Significantly improved lung aeration since the earlier study consistent with improved pulmonary edema. No new abnormalities. Electronically Signed    By: Lajean Manes M.D.   On: 06/26/2015 12:21   Dg Chest Port 1 View  06/26/2015  CLINICAL DATA:  Hypoxia. EXAM: PORTABLE CHEST 1 VIEW COMPARISON:  June 25, 2015 FINDINGS: Endotracheal tube tip is 1.2 cm above the carina. No pneumothorax. There is interstitial edema in the lung base regions. There is no airspace consolidation. There is cardiomegaly. The pulmonary vascularity is normal. No adenopathy. No bone lesions. IMPRESSION: Bibasilar interstitial edema with mild cardiac enlargement. Suspect a degree of congestive heart failure. Currently there is no airspace consolidation appreciable. Endotracheal tube tip is 1.2 cm above the carina. No pneumothorax. It may be prudent to consider withdrawing endotracheal tube 2 to 3 cm. Electronically Signed   By: Lowella Grip III M.D.   On: 06/26/2015 07:53   Dg Chest Port 1 View  06/25/2015  CLINICAL DATA:  Respiratory distress. EXAM: PORTABLE CHEST 1 VIEW COMPARISON:  Earlier today at 712 hours FINDINGS: 1611 hours Endotracheal tube terminates 2.0 cm above carina.Patient rotated right. Cardiomegaly accentuated by AP portable technique. No pleural effusion or pneumothorax. Low lung volumes with resultant pulmonary interstitial prominence. Persistent right base airspace disease. Clear left lung. IMPRESSION: No change in right base airspace disease. Cardiomegaly and low lung volumes, without congestive failure. Electronically Signed   By: Abigail Miyamoto M.D.   On: 06/25/2015 17:05   Dg Chest Port 1 View  06/25/2015  CLINICAL DATA:  Respiratory failure. EXAM: PORTABLE CHEST 1 VIEW COMPARISON:  06/24/2015 and 06/23/2015 FINDINGS: Endotracheal tube in good position. Further clearing of the infiltrate in the right lung. Heart size and vascularity are now normal. Left lung is clear. No effusions. IMPRESSION: Further clearing of the infiltrate in the right lung with slight residual infiltrate at the base. Electronically Signed   By: Lorriane Shire M.D.   On: 06/25/2015  09:22   Dg Chest Port 1 View  06/24/2015  CLINICAL DATA:  Healthcare associated pneumonia. EXAM: PORTABLE CHEST 1 VIEW COMPARISON:  06/23/2015 FINDINGS: Go endotracheal tube is in place 2.6 cm above the carina. Heart size is upper limits normal. Patchy infiltrate is identified throughout the right lung and at the left lung base, slightly improved compared prior  study. IMPRESSION: Persistent bilateral infiltrate showing slight interval improvement in aeration. Electronically Signed   By: Nolon Nations M.D.   On: 06/24/2015 08:58   Dg Chest Port 1 View  06/23/2015  CLINICAL DATA:  Endotracheal tube placement.  Initial encounter. EXAM: PORTABLE CHEST 1 VIEW COMPARISON:  Chest radiograph performed 06/22/2015 FINDINGS: The patient's endotracheal tube is seen ending 2-3 cm above the carina. New diffuse right-sided airspace opacification raises concern for pneumonia. No definite pleural effusion or pneumothorax is seen. Minimal left basilar airspace opacity is also seen. No pneumothorax identified The cardiomediastinal silhouette is borderline enlarged. No acute osseous abnormalities are seen. IMPRESSION: 1. Endotracheal tube seen ending 2-3 cm above the carina. 2. Diffuse new right-sided airspace opacification raises concern for pneumonia. 3. Borderline cardiomegaly. Electronically Signed   By: Garald Balding M.D.   On: 06/23/2015 02:33   Dg Chest Port 1 View  06/22/2015  CLINICAL DATA:  60 year old female with Healthcare associated pneumonia. Subsegmental pulmonary emboli earlier this month. Initial encounter. EXAM: PORTABLE CHEST 1 VIEW COMPARISON:  06/21/2015 and earlier. FINDINGS: Portable AP semi upright view at 0617 hours. Stable endotracheal tube. Stable visualized enteric tube. Stable cardiac size and mediastinal contours. Lung volumes are within normal limits. Allowing for portable technique, the lungs are clear. No pneumothorax or pleural effusion. IMPRESSION: 1.  Stable lines and tubes. 2.  No  acute cardiopulmonary abnormality. Electronically Signed   By: Genevie Ann M.D.   On: 06/22/2015 07:57   Dg Chest Port 1 View  06/21/2015  CLINICAL DATA:  Respiratory failure.  Followup exam. EXAM: PORTABLE CHEST 1 VIEW COMPARISON:  06/20/2015 FINDINGS: Endotracheal tube tip projects 2.9 cm above the carina, stable and well-positioned. Orogastric tube passes below the diaphragm into the stomach also stable. Cardiac silhouette there is normal in size and configuration. No mediastinal or hilar masses. There is persistent medial lung base opacity. This may reflect atelectasis or pneumonia, with atelectasis favored. No pulmonary edema. No evidence of pneumothorax. IMPRESSION: 1. Persistent medial lung base opacity most likely due to atelectasis. Pneumonia is possible. 2. Support apparatus well positioned. 3. No significant change from previous day's study allowing for differences in patient positioning and technique. Electronically Signed   By: Lajean Manes M.D.   On: 06/21/2015 08:16   Dg Chest Port 1 View  06/20/2015  CLINICAL DATA:  Hypoxia EXAM: PORTABLE CHEST 1 VIEW COMPARISON:  June 19, 2015 FINDINGS: Endotracheal tube tip is 3.8 cm above the carina. Nasogastric tube tip and side port are in the distal stomach region. No apparent pneumothorax. There is a small area of infiltrate in the right base. Lungs elsewhere clear. Heart is upper normal in size with pulmonary vascularity within normal limits. No adenopathy. IMPRESSION: Tube positions as described without pneumothorax. Small area of infiltrate right base. Lungs elsewhere clear. No change in cardiac silhouette. Electronically Signed   By: Lowella Grip III M.D.   On: 06/20/2015 07:30   Dg Chest Port 1 View  06/19/2015  CLINICAL DATA:  60 year old female status post adjustment of the endotracheal tube. EXAM: PORTABLE CHEST 1 VIEW COMPARISON:  Earlier chest radiograph 06/19/2015 FINDINGS: There has been interval retraction of the endotracheal  tube with tip female approximately 5 cm above the carina. There has been interval removal of the enteric tube. No significant interval change in the appearance of the lungs. Pleural effusion or pneumothorax. Stable cardiac silhouette. The osseous structures appear unremarkable. IMPRESSION: Interval retraction of the endotracheal tube with tip now approximately 5 cm above  the carina. Electronically Signed   By: Anner Crete M.D.   On: 06/19/2015 02:45   Dg Chest Port 1 View  06/19/2015  CLINICAL DATA:  Endotracheal tube repositioning.  Initial encounter. EXAM: PORTABLE CHEST 1 VIEW COMPARISON:  Chest radiograph performed earlier today at 1:23 a.m. FINDINGS: The patient's endotracheal tube is seen ending 1-2 cm above the carina. This could be retracted 1-2 cm, as deemed clinically appropriate. The enteric tube is seen extending below the diaphragm. Right basilar airspace opacity appears improved, and may reflect atelectasis or possibly mild pneumonia. No definite pleural effusion or pneumothorax is seen. The cardiomediastinal silhouette is normal in size. No acute osseous abnormalities are identified. External pacing pads are noted. IMPRESSION: 1. Endotracheal tube seen ending 1-2 cm above the carina. This could be retracted 1-2 cm, as deemed clinically appropriate. 2. Right basilar airspace opacity appears improved, and may reflect atelectasis or possibly mild pneumonia. These results were called by telephone at the time of interpretation on 06/19/2015 at 1:58 am to [facility] on [facility], who verbally acknowledged these results. Electronically Signed   By: Garald Balding M.D.   On: 06/19/2015 02:04   Dg Chest Port 1 View  06/19/2015  CLINICAL DATA:  Endotracheal tube placement.  Initial encounter. EXAM: PORTABLE CHEST 1 VIEW COMPARISON:  None. FINDINGS: The patient's endotracheal tube is seen ending just above the carina. This should be retracted 2-3 cm. The enteric tube is noted extending below the diaphragm.  The lungs are well-aerated. Right basilar airspace opacity may reflect atelectasis or pneumonia. A small right pleural effusion is suspected. No pneumothorax is seen. The cardiomediastinal silhouette is within normal limits. No acute osseous abnormalities are seen. External pacing pads are noted. IMPRESSION: 1. Endotracheal tube seen ending just above the carina. This should be retracted 3 cm. 2. Right basilar airspace opacity may reflect atelectasis or pneumonia. Small right pleural effusion suspected. These results were called by telephone at the time of interpretation on 06/19/2015 at 1:58 am to [facility] on [facility], who verbally acknowledged these results. Electronically Signed   By: Garald Balding M.D.   On: 06/19/2015 02:02   Dg Chest Port 1 View  06/19/2015  CLINICAL DATA:  60 year old female with shortness of breath EXAM: PORTABLE CHEST 1 VIEW COMPARISON:  Radiograph dated 06/18/2015 FINDINGS: The patient is tilted to the right. There is stable cardiomegaly. Bibasilar linear atelectatic changes/scarring noted. There is no focal consolidation, pleural effusion, or pneumothorax. There is degenerative changes of the spine. No acute fracture. IMPRESSION: Stable cardiomegaly.  No acute cardiopulmonary process. Electronically Signed   By: Anner Crete M.D.   On: 06/19/2015 00:41   Dg Chest Port 1 View  06/18/2015  CLINICAL DATA:  Acute onset of shortness of breath on exertion. Initial encounter. EXAM: PORTABLE CHEST 1 VIEW COMPARISON:  Chest radiograph performed 06/16/2015 FINDINGS: The lungs are well-aerated. Mild bibasilar atelectasis is noted. There is no evidence of pleural effusion or pneumothorax. The cardiomediastinal silhouette is enlarged. No acute osseous abnormalities are seen. IMPRESSION: Mild bibasilar atelectasis noted.  Cardiomegaly noted. Electronically Signed   By: Garald Balding M.D.   On: 06/18/2015 04:44   Dg Abd Portable 1v  06/19/2015  CLINICAL DATA:  Evaluate orogastric tube  placement EXAM: PORTABLE ABDOMEN - 1 VIEW COMPARISON:  06/19/15, 06/04/15 FINDINGS: OG tube projects over the stomach with tip over the anticipated position of the antrum. PEG tube also projects over the stomach. Oral contrast noted throughout the large bowel. 75mm radiodensity projects over  the left abdomen over a loop of large bowel and is not seen on 06/04/15. IMPRESSION: OG tube as described. Abnormal density could represent foreign body either within the abdominal cavity or on the body wall, as well as possibly ingested material. Electronically Signed   By: Skipper Cliche M.D.   On: 06/19/2015 11:18   Dg Abd Portable 1v  06/19/2015  CLINICAL DATA:  Orogastric tube placement.  Initial encounter. EXAM: PORTABLE ABDOMEN - 1 VIEW COMPARISON:  Abdominal radiograph performed 06/04/2015 FINDINGS: The patient's enteric tube is noted ending overlying the body of the stomach. The endotracheal tube is noted ending just above the carina. This should be retracted 2-3 cm. The visualized bowel gas pattern is difficult to fully assess due to motion artifact, though contrast is seen within the colon. Right basilar airspace opacity is noted. No pleural effusion is seen. No acute osseous abnormalities are identified. External pacing pads are noted. IMPRESSION: 1. Enteric tube noted ending overlying the body of the stomach. 2. Endotracheal tube seen ending just above the carina. This should be retracted 2-3 cm. 3. Right basilar airspace opacity noted. These results were called by telephone at the time of interpretation on 06/19/2015 at 1:58 am to [facility] on [facility], who verbally acknowledged these results. Electronically Signed   By: Garald Balding M.D.   On: 06/19/2015 02:00   Dg Swallowing Func-speech Pathology  07/02/2015  Objective Swallowing Evaluation:   Patient Details Name: DEEANN KATONA MRN: [id number] Date of Birth: 1956-06-05 Today's Date: 07/02/2015 Time: SLP Start Time (ACUTE ONLY): 1040-SLP Stop Time (ACUTE  ONLY): 1100 SLP Time Calculation (min) (ACUTE ONLY): 20 min Past Medical History: @PMH @ Past Surgical History: Past Surgical History Procedure Laterality Date . Cardiac surgery   . Appendectomy   . Breast surgery   . Mastectomy Left 03/2007 . Cardiac catheterization  2005 . Vein surgery Right 2005 . Tubal ligation Right  . Radiology with anesthesia N/A 04/11/2015   Procedure: RADIOLOGY WITH ANESTHESIA;  Surgeon: Luanne Bras, MD;  Location: [place];  Service: Radiology;  Laterality: N/A; . Cardiac catheterization N/A 04/13/2015   Procedure: Temporary Pacemaker;  Surgeon: Peter M Martinique, MD;  Location: [place] [facility];  Service: Cardiovascular;  Laterality: N/A; . Peg placement N/A 04/18/2015   Procedure: PERCUTANEOUS ENDOSCOPIC GASTROSTOMY (PEG) PLACEMENT;  Surgeon: Judeth Horn, MD;  Location: [place];  Service: General;  Laterality: N/A;  bedside . 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 She was sent to an LTAC and then CIR. She had PEG and trach 10/26 (has since been decannulated). She had RLL PE on 06/07/15. She developed altered mental status and acute hypoxic/hypercapnic respiratory failure 06/19/15 and was transferred back to ICU from [place]. CT of the head with ? acute left pontine infarct. Was difficult intubation. s/p tracheostomy 1/4. CCM notes vocal cord paresis/hypomobility per laryngoscopy 12/16. Found to have MSSA HCAP and E coli UTI. Subjective: pt alert, aphasic, inspiratory stridor Assessment / Plan / Recommendation CHL IP CLINICAL IMPRESSIONS 07/02/2015 Therapy Diagnosis Moderate pharyngeal phase dysphagia Clinical Impression Pt presents with a primary sensory based oropharygneal dysphagia. Pt tested with and without PMSV (see treatment note), no change in function.  With liquid boluses pt has an intermittent delay in swallow initiation. With thin liquids this delay occurs with the initial sips resulting in  silent aspiration before the swallow; pt eventually responds with a cough when aspirate fully descends trachea. Pt consistently protected airway consuming nectar, but did have some delay with an independent second swallow to fully clear trace lingual residual. Pts function with nectar was adequate even with large consecutive straw sips. Also trialed a chin tuck with thin liquids with great success, no penetration with consecutive straw sips; can be trialed in therapy. Recommend Dys 3 (mechanical soft) diet with nectar thick liquids will follow for tolerance.  Impact on safety and function Mild aspiration risk   CHL IP TREATMENT RECOMMENDATION 07/02/2015 Treatment Recommendations Therapy as outlined in treatment plan below   Prognosis 07/02/2015 Prognosis for Safe Diet Advancement Good Barriers to Reach Goals Language deficits Barriers/Prognosis Comment -- CHL IP DIET RECOMMENDATION 07/02/2015 SLP Diet Recommendations Dysphagia 3 (Mech soft) solids;Nectar thick liquid Liquid Administration via Cup;Straw Medication Administration Whole meds with puree Compensations Slow rate;Minimize environmental distractions Postural Changes Seated upright at 90 degrees   CHL IP OTHER RECOMMENDATIONS 07/02/2015 Recommended Consults -- Oral Care Recommendations Oral care BID Other Recommendations Order thickener from pharmacy   CHL IP FOLLOW UP RECOMMENDATIONS 07/02/2015 Follow up Recommendations Inpatient Rehab   CHL IP FREQUENCY AND DURATION 07/02/2015 Speech Therapy Frequency (ACUTE ONLY) min 2x/week Treatment Duration 2 weeks      CHL IP ORAL PHASE 07/02/2015 Oral Phase WFL Oral - Pudding Teaspoon -- Oral - Pudding Cup -- Oral - Honey Teaspoon -- Oral - Honey Cup NT Oral - Nectar Teaspoon -- Oral - Nectar Cup WFL Oral - Nectar Straw -- Oral - Thin Teaspoon -- Oral - Thin Cup WFL Oral - Thin Straw WFL  Oral - Puree WFL Oral - Mech Soft -- Oral - Regular WFL Oral - Multi-Consistency -- Oral - Pill -- Oral Phase - Comment --  CHL IP PHARYNGEAL PHASE 07/02/2015 Pharyngeal Phase Impaired Pharyngeal- Pudding Teaspoon -- Pharyngeal -- Pharyngeal- Pudding Cup -- Pharyngeal -- Pharyngeal- Honey Teaspoon -- Pharyngeal -- Pharyngeal- Honey Cup NT Pharyngeal -- Pharyngeal- Nectar Teaspoon -- Pharyngeal -- Pharyngeal- Nectar Cup Delayed swallow initiation-pyriform sinuses Pharyngeal Material does not enter airway Pharyngeal- Nectar Straw Delayed swallow initiation-pyriform sinuses Pharyngeal -- Pharyngeal- Thin Teaspoon -- Pharyngeal -- Pharyngeal- Thin Cup Delayed swallow initiation-pyriform sinuses;Penetration/Aspiration before swallow Pharyngeal Material enters airway, passes BELOW cords without attempt by patient to eject out (silent aspiration) Pharyngeal- Thin Straw WFL;Compensatory strategies attempted (with notebox) Pharyngeal Material does not enter airway Pharyngeal- Puree WFL Pharyngeal -- Pharyngeal- Mechanical Soft -- Pharyngeal -- Pharyngeal- Regular WFL Pharyngeal -- Pharyngeal- Multi-consistency -- Pharyngeal -- Pharyngeal- Pill WFL Pharyngeal -- Pharyngeal Comment --  CHL IP CERVICAL ESOPHAGEAL PHASE 07/02/2015 Cervical Esophageal Phase WFL Pudding Teaspoon -- Pudding Cup -- Honey Teaspoon -- Honey Cup -- Nectar Teaspoon -- Nectar Cup -- Nectar Straw -- Thin Teaspoon -- Thin Cup -- Thin Straw -- Puree -- Mechanical Soft -- Regular -- Multi-consistency -- Pill -- Cervical Esophageal Comment -- Herbie Baltimore, MA CCC-SLP [phone] Lynann Beaver 07/02/2015, 11:26 AM              Dg Swallowing Func-speech Pathology  06/15/2015  Objective Swallowing Evaluation:   Patient Details Name: Kerry [name] MRN: [id number] Date of Birth: September 04, 1955 Today's Date: 06/15/2015 Time: SLP Start Time: 0855-SLP Stop Time : 0922 SLP Time Calculation (min) : 27 min Past Medical History: Past Medical History Diagnosis  Date . Hypertension  . Coronary artery disease  . Diabetes mellitus  . Breast cancer ([place]) 03/10/2007   Left breat .  Degenerative disc disease, cervical  . Degenerative disc disease, lumbar  . Fibromyalgia  . Hyperlipidemia  . Carpal tunnel syndrome on right  . PE (pulmonary embolism)  . UTI (lower urinary tract infection)  . GIB (gastrointestinal bleeding)  . Elevated troponin  . HTN (hypertension) 06/13/2015 Past Surgical History: Past Surgical History Procedure Laterality Date . Cardiac surgery   . Appendectomy   . Breast surgery   . Mastectomy Left 03/2007 . Cardiac catheterization  2005 . Vein surgery Right 2005 . Tubal ligation Right  . Radiology with anesthesia N/A 04/11/2015   Procedure: RADIOLOGY WITH ANESTHESIA;  Surgeon: Luanne Bras, MD;  Location: [place];  Service: Radiology;  Laterality: N/A; . Cardiac catheterization N/A 04/13/2015   Procedure: Temporary Pacemaker;  Surgeon: Peter M Martinique, MD;  Location: [place] CV LAB;  Service: Cardiovascular;  Laterality: N/A; . Peg placement N/A 04/18/2015   Procedure: PERCUTANEOUS ENDOSCOPIC GASTROSTOMY (PEG) PLACEMENT;  Surgeon: Judeth Horn, MD;  Location: [place];  Service: General;  Laterality: N/A;  bedside . Esophagogastroduodenoscopy N/A 04/18/2015   Procedure: ESOPHAGOGASTRODUODENOSCOPY (EGD);  Surgeon: Judeth Horn, MD;  Location: [facility] ENDOSCOPY;  Service: General;  Laterality: N/A; HPI: pt presents with dense L MCA Infarct involving L Temporal, Frontoparietal, and L Basal Ganglia regions and was started on TPA, but unable to complete full dose due to hypertension, but did recieve IR intervention. Pt extubated 10/30. pt with hx of DM, HTN, Breast CA s/p Mastectomy, and Fibromyalgia. Patient transferred to [facility] and then Inpatient Rehab. Transferred back to acute on 06/07/15 due to PE. Readmitted to CIR on 06/13/2015 for completion of family education prior to discharge home.  Assessment / Plan / Recommendation CHL IP  CLINICAL IMPRESSIONS 06/15/2015 Therapy Diagnosis Mild oral phase dysphagia;Moderate pharyngeal phase dysphagia Clinical Impression Pt presents with slight decline in swallowing function in comparison to previous MBS, suspect due to deconditioning s/p acute treatment for PE.  Overall pt remains with a mild oral phase dysphagia and moderate pharyngeal phase dysphagia with both sensory and motor components.  Oral phase deficits again are characterized by right sided labial and lingual weakness which results in weakened manipulation and prolonged oral transit of boluses to the oropharynx.  Decreased pharyngeal sensation and decreased base of tongue retraction also resulted in premature spillage of materials into the pharynx and delayed swallow initiation.  Swallow response was delayed to the vallecula with thickened liquids and solids, and further delayed to the pyriforms with thin liquids.  Due to delayed swallow initiation, pt exhibited deep, silent penetration of nectar thick liquids on initial cup sip and again when challenged with larger boluses; however, smaller boluses were tolerated without airway compromise.  Post swallow, moderate vallecular residuals were noted with thickened liquids and solids and mild-moderate pyriform residue with thin liquids due to pharyngeal weakness.  Consistent deep penetration during the swallow and eventual unsensed aspiration of residuals after the swallow was visualized with thin liquids, even when boluses were strictly controlled via teaspoon.  Suspect increase in frequency and severity of airway compromise in comparison to previous study to be related to increased respiratory rate and effort required for breathing.  Pt continues to spontaneously use extra swallows which, in combination with SLP interventions for alternating solids and liquids effectively minimized pharyngeal residue.  Recommend that pt remain on dys 2 textures and nectar thick liquids with full supervision for  use of swallowing precautions.  Do not recommend continuing with water protocol at this time due to pt's likely diminished functional  reserve.  Prognosis for advancement fair with trials of advanced consistencies, SLP follow up for use of safe swallowing strategies, and repeat objective swallow study.   Impact on safety and function Moderate aspiration risk   CHL IP TREATMENT RECOMMENDATION 06/12/2015 Treatment Recommendations Therapy as outlined in treatment plan below   Prognosis 06/15/2015 Prognosis for Safe Diet Advancement Fair Barriers to Reach Goals Cognitive deficits;Language deficits;Motivation Barriers/Prognosis Comment -- CHL IP DIET RECOMMENDATION 06/15/2015 SLP Diet Recommendations Dysphagia 2 (Fine chop) solids;Nectar thick liquid Liquid Administration via Cup Medication Administration Other (Comment) Compensations Slow rate;Small sips/bites;Minimize environmental distractions;Multiple dry swallows after each bite/sip;Follow solids with liquid Postural Changes Seated upright at 90 degrees            CHL IP ORAL PHASE 06/15/2015 Oral Phase Impaired Oral - Pudding Teaspoon -- Oral - Pudding Cup -- Oral - Honey Teaspoon -- Oral - Honey Cup -- Oral - Nectar Teaspoon -- Oral - Nectar Cup Reduced posterior propulsion;Lingual/palatal residue;Piecemeal swallowing;Decreased bolus cohesion;Delayed oral transit;Premature spillage Oral - Nectar Straw -- Oral - Thin Teaspoon -- Oral - Thin Cup Weak lingual manipulation;Reduced posterior propulsion;Lingual/palatal residue;Piecemeal swallowing;Delayed oral transit;Decreased bolus cohesion;Premature spillage Oral - Thin Straw -- Oral - Puree Weak lingual manipulation;Reduced posterior propulsion;Lingual/palatal residue;Premature spillage;Delayed oral transit Oral - Mech Soft -- Oral - Regular Weak lingual manipulation;Reduced posterior propulsion;Lingual/palatal residue;Piecemeal swallowing;Impaired mastication;Delayed oral transit;Decreased bolus cohesion;Premature  spillage Oral - Multi-Consistency -- Oral - Pill -- Oral Phase - Comment --  CHL IP PHARYNGEAL PHASE 06/15/2015 Pharyngeal Phase -- Pharyngeal- Pudding Teaspoon -- Pharyngeal -- Pharyngeal- Pudding Cup -- Pharyngeal -- Pharyngeal- Honey Teaspoon -- Pharyngeal -- Pharyngeal- Honey Cup -- Pharyngeal -- Pharyngeal- Nectar Teaspoon -- Pharyngeal -- Pharyngeal- Nectar Cup Delayed swallow initiation-vallecula;Delayed swallow initiation-pyriform sinuses;Reduced laryngeal elevation;Reduced airway/laryngeal closure;Reduced tongue base retraction;Penetration/Aspiration during swallow;Pharyngeal residue - valleculae Pharyngeal Material enters airway, CONTACTS cords and not ejected out Pharyngeal- Nectar Straw -- Pharyngeal -- Pharyngeal- Thin Teaspoon -- Pharyngeal -- Pharyngeal- Thin Cup Reduced airway/laryngeal closure;Reduced laryngeal elevation;Delayed swallow initiation-pyriform sinuses;Reduced tongue base retraction;Penetration/Aspiration during swallow;Penetration/Apiration after swallow;Pharyngeal residue - pyriform;Pharyngeal residue - valleculae Pharyngeal Material enters airway, passes BELOW cords without attempt by patient to eject out (silent aspiration) Pharyngeal- Thin Straw -- Pharyngeal -- Pharyngeal- Puree Delayed swallow initiation-vallecula;Pharyngeal residue - valleculae;Reduced laryngeal elevation;Reduced airway/laryngeal closure;Reduced tongue base retraction Pharyngeal -- Pharyngeal- Mechanical Soft -- Pharyngeal -- Pharyngeal- Regular Delayed swallow initiation-vallecula;Reduced tongue base retraction;Pharyngeal residue - valleculae;Reduced laryngeal elevation Pharyngeal -- Pharyngeal- Multi-consistency -- Pharyngeal -- Pharyngeal- Pill -- Pharyngeal -- Pharyngeal Comment --  No flowsheet data found. Page, Selinda Orion 06/15/2015, 12:30 PM               Microbiology: No results found for this or any previous visit (from the past 240 hour(s)).   Labs: Basic Metabolic Panel:  Recent Labs Lab  07/03/15 0525 07/06/15 0419 07/08/15 0248  NA 142 142 142  K 3.5 3.7 3.6  CL 103 105 104  CO2 28 28 30   GLUCOSE 136* 149* 122*  BUN 10 10 10   CREATININE 0.47 0.47 0.48  CALCIUM 9.2 9.5 9.6   Liver Function Tests:  Recent Labs Lab 07/03/15 0525  AST 20  ALT 31  ALKPHOS 46  BILITOT 0.5  PROT 6.4*  ALBUMIN 2.9*   No results for input(s): LIPASE, AMYLASE in the last 168 hours. No results for input(s): AMMONIA in the last 168 hours. CBC:  Recent Labs Lab 07/03/15 0525 07/06/15 0419 07/08/15 0248  WBC 8.8 8.0 7.4  HGB 10.8* 11.6* 12.0  HCT 34.3* 37.6 37.8  MCV 90.7 90.4 90.2  PLT 265 268 251   Cardiac Enzymes: No results for input(s): CKTOTAL, CKMB, CKMBINDEX, TROPONINI in the last 168 hours. BNP: BNP (last 3 results) No results for input(s): BNP in the last 8760 hours.  ProBNP (last 3 results) No results for input(s): PROBNP in the last 8760 hours.  CBG:  Recent Labs Lab 07/08/15 0643 07/08/15 1137 07/08/15 1633 07/08/15 2115 07/09/15 0549  GLUCAP 119* 217* 157* 184* 153*       Signed:  Bryton Waight  [facility] 07/09/2015, 10:39 AM

## 2015-07-09 NOTE — Discharge Instructions (Signed)
Acute Respiratory Distress Syndrome °Acute respiratory distress syndrome is a life-threatening condition in which fluid collects in the lungs. This condition can cause severe shortness of breath and low oxygen levels in the blood. It can also cause the lungs and other vital organs to fail. The condition usually develops within 24 to 48 hours of an infection, illness, surgery, or injury. °CAUSES °This condition may be caused by: °· An infection, such as sepsis or pneumonia. °· A serious injury to the head or chest. °· A major surgery. °· A drug overdose. °· Breathing in harmful chemicals or smoke. °· Blood transfusions. °· A blood clot in the lungs. °SYMPTOMS °Symptoms of this condition include: °· Fever. °· Difficulty breathing. °· Bluish skin (cyanosis). °· A fast or irregular heartbeat. °· Low blood pressure (hypotension). °· Agitation. °· Confusion. °· Lack of energy (lethargy). °· Sweating. °DIAGNOSIS °This condition may be diagnosed by using tests to rule out other diseases and conditions that cause similar symptoms. You may have: °· A chest X-ray or CT scan. °· Blood tests. °· A sputum culture. In this test, you will be asked to spit so that a sample of lung fluid can be taken for testing. °· Bronchoscopy. During this test a thin, flexible tool is passed into the mouth or nose, down the windpipe, and into the lungs. °TREATMENT °This condition may be treated with: °· Oxygen. A breathing machine (ventilator) is often used to provide oxygen and help with breathing. °· Medicine to help you relax (sedative). °· Fluids and nutrients given through an IV tube. °· Blood pressure medicine. °· Steroid medicine to help decrease inflammation in the lungs. °· Diuretic medicine to get rid of extra fluid in the body. °Additional treatment may be needed, depending on the cause of the condition. It can take up to 12 months to recover from this condition. Some people recover fully, but others may continue to  have: °· Weakness. °· Shortness of breath. °· Memory problems. °· Depression. °HOME CARE INSTRUCTIONS °Until you recover from this condition: °· Do not smoke. °· Limit alcohol intake to no more than 1 drink per day for nonpregnant women and 2 drinks per day for men. One drink equals 12 oz of beer, 5 oz of wine, or 1½ oz of hard liquor. °· Ask friends and family to help you if daily activities make you tired. °SEEK MEDICAL CARE IF: °· You become short of breath with activity or while resting. °· You develop a cough that does not go away. °· You have a fever. °SEEK IMMEDIATE MEDICAL CARE IF: °· You have sudden shortness of breath. °· You develop chest pain that does not go away. °· You develop swelling or pain in one of your legs. °· You cough up blood. °  °This information is not intended to replace advice given to you by your health care provider. Make sure you discuss any questions you have with your health care provider. °  °Document Released: 06/09/2005 Document Revised: 10/24/2014 Document Reviewed: 06/05/2014 °Elsevier Interactive Patient Education ©2016 [employer] Inc[employer] ° °

## 2015-07-09 NOTE — Progress Notes (Signed)
PULMONARY / CRITICAL CARE MEDICINE   Name: Kerry [name] MRN: [id number] DOB: 27-Jul-1955    ADMISSION DATE:  06/19/2015  REFERRING MD:  CIR  CHIEF COMPLAINT: Altered mental status  SIGNIFICANT EVENTS: 12/14 CT chest >> RLL PE 12/15 Doppler Lt leg >> DVT Rt calf 12/16 Laryngoscopy >> vocal fold paresis/hypomobility 12/17 CT head >> Lt MCA CVA ................. 12/27 Transfer to ICU. 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 06/26/15 - Alert weaning 06/27/15 - s/p trach 06/27/2015 by DF. Following commands. RN reports prolonged urinary retention  CULTURES: 12/27 Blood >>neg 12/27 Urine >> E coli 12/27 Sputum >> MSSA 12/31 Sputum >>neg  ANTIBIOTICS: 12/27 Vancomycin >> 12/29 12/27 Fortaz >> 12/29 12/29 Ancef >> 12/31 12/31 Vancomycin >>1/2 12/31 Zosyn >>1/2 Ceftriaxone 1/2 - 1/9  SUBJECTIVE/OVERNIGHT/INTERVAL HX:  No distress.   REVIEW OF SYSTEMS:  Patient denies any fever or chills. She denies any chest pain or pressure.  VITAL SIGNS: BP 128/60 mmHg  Pulse 74  Temp(Src) 98.1 F (36.7 C) (Oral)  Resp 16  Ht 5\' 6"  (1.676 m)  Wt 74.345 kg (163 lb 14.4 oz)  BMI 26.47 kg/m2  SpO2 98%  VENTILATOR SETTINGS: Vent Mode:  [-]  FiO2 (%):  [28 %] 28 %  INTAKE / OUTPUT: I/O last 3 completed shifts: In: 53 [P.O.:540] Out: 0   PHYSICAL EXAMINATION: General: Awake. Sitting up in bed Neuro:  No distress. Following commands. Nods appropraitely & mouths answers. HEENT:  Tracheostomy in place now w/ 6 cuffless. Scant bloody secretions s/p change Cardiovascular:   Regular rate. No edema. Normal S1 & S2. Lungs: scattered rhonchi. Clear tracheal secretions prior to trach change  Skin:  Warm & dry. No rash on  exposed skin.  LABS: PULMONARY No results for input(s): PHART, PCO2ART, PO2ART, HCO3, TCO2, O2SAT in the last 168 hours.  Invalid input(s): PCO2, PO2  CBC  Recent Labs Lab 07/03/15 0525 07/06/15 0419 07/08/15 0248  HGB 10.8* 11.6* 12.0  HCT 34.3* 37.6 37.8  WBC 8.8 8.0 7.4  PLT 265 268 251    COAGULATION No results for input(s): INR in the last 168 hours.  CARDIAC  No results for input(s): TROPONINI in the last 168 hours. No results for input(s): PROBNP in the last 168 hours.   CHEMISTRY  Recent Labs Lab 07/03/15 0525 07/06/15 0419 07/08/15 0248  NA 142 142 142  K 3.5 3.7 3.6  CL 103 105 104  CO2 28 28 30   GLUCOSE 136* 149* 122*  BUN 10 10 10   CREATININE 0.47 0.47 0.48  CALCIUM 9.2 9.5 9.6   Estimated Creatinine Clearance: 79 mL/min (by C-G formula based on Cr of 0.48).   LIVER  Recent Labs Lab 07/03/15 0525  AST 20  ALT 31  ALKPHOS 46  BILITOT 0.5  PROT 6.4*  ALBUMIN 2.9*     INFECTIOUS No results for input(s): LATICACIDVEN, PROCALCITON in the last 168 hours.   ENDOCRINE CBG (last 3)   Recent Labs  07/08/15 1633 07/08/15 2115 07/09/15 0549  GLUCAP 157* 184* 153*    No results found.  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  She was sent to [facility] and then CIR.  She had RLL PE on 06/07/15.  She developed altered mental status and acute hypoxic/hypercapnic respiratory failure 06/19/15 and transferred back to ICU.  Was difficult intubation.  Found to have MSSA HCAP and E coli UTI.  Self extubated and reintubated 12/31 due to aspiration. Now s/p tracheostomy 1/4 by DF. 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 Have changed her to 6 cuffless.   1. Acute Hypoxic Respiratory Failure:  Secondary to aspiration from vocal core paresis.  S/P tracheostomy 1/4. Trach changed to #6 cuffless today: nursing/trach care  team to go over trach care w/ family. Will set her up w/trach clinic in [place]. RT office will notify pt.  2. Left Leg DVT & Right Lower Lobe PE:  December 2016. Treatment with Xarelto.  Remainder of care per primary service. We will s/o   Erick Colace ACNP-BC [place] Pager # [phone] OR # [phone] if no answer   07/09/2015, 9:26 AM

## 2015-07-09 NOTE — Progress Notes (Signed)
Talked to [employer] [employer] with [place], Resp Therapist will meet the patient at her home today to provide further teaching/ education regarding trach care. CM talked to spouse and son [name] they have been trained on trach care by the nursing staff and the Resp Therapist and are comfortable with the process. [place] services will be provided by [facility] and patient will be transported home via ambulance; she will follow up at the [facility]; [facility] [place] RN,MHA,BSN [phone]

## 2015-07-11 ENCOUNTER — Telehealth: Payer: Self-pay | Admitting: Physical Medicine & Rehabilitation

## 2015-07-11 NOTE — Telephone Encounter (Signed)
Benjamine Mola PT with [place] called to let Dr. Letta Pate know patient refused PT due to $33.00 copay each visit.  Any questions please call her at [phone].

## 2015-07-12 NOTE — Telephone Encounter (Signed)
FYI

## 2015-08-09 ENCOUNTER — Other Ambulatory Visit: Payer: Self-pay

## 2015-08-09 NOTE — Patient Outreach (Signed)
mRS collected on 08/09/15. mRS = 5

## 2015-08-29 ENCOUNTER — Encounter (HOSPITAL_COMMUNITY): Payer: Self-pay | Admitting: Emergency Medicine

## 2015-08-29 ENCOUNTER — Emergency Department (HOSPITAL_COMMUNITY): Payer: BLUE CROSS/BLUE SHIELD

## 2015-08-29 ENCOUNTER — Emergency Department (HOSPITAL_COMMUNITY)
Admission: EM | Admit: 2015-08-29 | Discharge: 2015-08-29 | Disposition: A | Discharge status: Home / Self Care | Payer: BLUE CROSS/BLUE SHIELD | Attending: Emergency Medicine | Admitting: Emergency Medicine

## 2015-08-29 DIAGNOSIS — Z7901 Long term (current) use of anticoagulants: Secondary | ICD-10-CM | POA: Diagnosis not present

## 2015-08-29 DIAGNOSIS — Z9889 Other specified postprocedural states: Secondary | ICD-10-CM | POA: Diagnosis not present

## 2015-08-29 DIAGNOSIS — M797 Fibromyalgia: Secondary | ICD-10-CM | POA: Diagnosis not present

## 2015-08-29 DIAGNOSIS — Z8673 Personal history of transient ischemic attack (TIA), and cerebral infarction without residual deficits: Secondary | ICD-10-CM | POA: Insufficient documentation

## 2015-08-29 DIAGNOSIS — J9509 Other tracheostomy complication: Secondary | ICD-10-CM | POA: Diagnosis present

## 2015-08-29 DIAGNOSIS — I2510 Atherosclerotic heart disease of native coronary artery without angina pectoris: Secondary | ICD-10-CM | POA: Diagnosis not present

## 2015-08-29 DIAGNOSIS — I10 Essential (primary) hypertension: Secondary | ICD-10-CM | POA: Insufficient documentation

## 2015-08-29 DIAGNOSIS — Z87440 Personal history of urinary (tract) infections: Secondary | ICD-10-CM | POA: Diagnosis not present

## 2015-08-29 DIAGNOSIS — Z853 Personal history of malignant neoplasm of breast: Secondary | ICD-10-CM | POA: Insufficient documentation

## 2015-08-29 DIAGNOSIS — Z86711 Personal history of pulmonary embolism: Secondary | ICD-10-CM | POA: Insufficient documentation

## 2015-08-29 DIAGNOSIS — Z794 Long term (current) use of insulin: Secondary | ICD-10-CM | POA: Diagnosis not present

## 2015-08-29 DIAGNOSIS — Z79899 Other long term (current) drug therapy: Secondary | ICD-10-CM | POA: Insufficient documentation

## 2015-08-29 DIAGNOSIS — F17200 Nicotine dependence, unspecified, uncomplicated: Secondary | ICD-10-CM | POA: Diagnosis not present

## 2015-08-29 DIAGNOSIS — Z7984 Long term (current) use of oral hypoglycemic drugs: Secondary | ICD-10-CM | POA: Insufficient documentation

## 2015-08-29 DIAGNOSIS — J9500 Unspecified tracheostomy complication: Secondary | ICD-10-CM

## 2015-08-29 DIAGNOSIS — E119 Type 2 diabetes mellitus without complications: Secondary | ICD-10-CM | POA: Diagnosis not present

## 2015-08-29 HISTORY — DX: Cerebral infarction, unspecified: I63.9

## 2015-08-29 NOTE — ED Provider Notes (Signed)
CSN: [id number]     Arrival date & time 08/29/15  1312 History   First MD Initiated Contact with Patient 08/29/15 1510     Chief Complaint  Patient presents with  . trach problem       The history is provided by the patient and a relative. No language interpreter was used.   Kerry [name] is a 60 y.o. female who presents to the Emergency Department complaining of tracheostomy problem. Hx is provided by the patient and her family.  She has had a tracheostomy in place since December. She has been home from the hospital last month and doing well at home. She was coughing at home and the Velcro came off of this side of her tracheostomy band/holder and the tracheostomy tube came out. Family replace the trach and they noticed some bleeding around the site and they wanted to make sure it was okay. She has a 6 uncuffed trach. Family states that they do take out and clean the inner cannula regularly but they have never had the whole device come out at that time. Before. She is on humidified air at home. No recent fevers, difficulty breathing. She does have occasional phlegm production that they relate to her humidified air.  She currently denies any complaints and has no difficulty breathing.  Past Medical History  Diagnosis Date  . Hypertension   . Coronary artery disease   . Diabetes mellitus   . Breast cancer ([place]) 03/10/2007    Left breat  . Degenerative disc disease, cervical   . Degenerative disc disease, lumbar   . Fibromyalgia   . Hyperlipidemia   . Carpal tunnel syndrome on right   . PE (pulmonary embolism)   . UTI (lower urinary tract infection)   . GIB (gastrointestinal bleeding)   . Elevated troponin   . HTN (hypertension) 06/13/2015  . 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  . Stroke [facility]) 2009    paralyzed on right side   Past Surgical History  Procedure Laterality Date  . Cardiac surgery    . Appendectomy    . Breast  surgery    . Mastectomy Left 03/2007  . Cardiac catheterization  2005  . Vein surgery Right 2005  . Tubal ligation Right   . Radiology with anesthesia N/A 04/11/2015    Procedure: RADIOLOGY WITH ANESTHESIA;  Surgeon: Luanne Bras, MD;  Location: [place];  Service: Radiology;  Laterality: N/A;  . Cardiac catheterization N/A 04/13/2015    Procedure: Temporary Pacemaker;  Surgeon: Peter M Martinique, MD;  Location: [place] [facility];  Service: Cardiovascular;  Laterality: N/A;  . Peg placement N/A 04/18/2015    Procedure: PERCUTANEOUS ENDOSCOPIC GASTROSTOMY (PEG) PLACEMENT;  Surgeon: Judeth Horn, MD;  Location: [place];  Service: General;  Laterality: N/A;  bedside  . Esophagogastroduodenoscopy N/A 04/18/2015    Procedure: ESOPHAGOGASTRODUODENOSCOPY (EGD);  Surgeon: Judeth Horn, MD;  Location: [facility] ENDOSCOPY;  Service: General;  Laterality: N/A;   Family History  Problem Relation Age of Onset  . Cancer Mother     Breast cancer (right)  . Diabetes Sister   . Cancer Maternal Aunt     Breast cancer  . Cancer Maternal Aunt     Breast cancer  . Cancer Cousin     Breast Cancer   Social History  Substance Use Topics  . Smoking status: Current Every Day Smoker -- 0.25 packs/day  . Smokeless tobacco: Never Used  .  Alcohol Use: Yes     Comment: Social drinker   OB History    No data available     Review of Systems  All other systems reviewed and are negative.     Allergies  Codeine; Oxycodone; and Oxycontin  Home Medications   Prior to Admission medications   Medication Sig Start Date End Date Taking? Authorizing Provider  acetaminophen (TYLENOL) 160 MG/5ML solution Take 10.2-20.3 mLs (325-650 mg total) by mouth every 4 (four) hours as needed for mild pain. 06/06/15  Yes Ivan Anchors Love, PA-C  amLODipine (NORVASC) 10 MG tablet Take 1 tablet (10 mg total) by mouth daily. 07/09/15  Yes Maryann Mikhail, DO  atorvastatin (LIPITOR) 40 MG tablet Take 1 tablet (40 mg total) by mouth  daily at 6 PM. 07/09/15  Yes Maryann Mikhail, DO  carvedilol (COREG) 12.5 MG tablet Take 1 tablet (12.5 mg total) by mouth 2 (two) times daily with a meal. 07/09/15  Yes Maryann Mikhail, DO  citalopram (CELEXA) 20 MG tablet Take 20 mg by mouth daily. 07/28/15  Yes Historical Provider, MD  folic acid (FOLVITE) 1 MG tablet Take 1 mg by mouth daily.   Yes Historical Provider, MD  glipiZIDE (GLUCOTROL) 5 MG tablet Take 1 tablet (5 mg total) by mouth daily before breakfast. 07/09/15  Yes Maryann Mikhail, DO  hydrocerin (EUCERIN) CREA Apply 1 application topically 2 (two) times daily. To bilateral feet 06/06/15  Yes Ivan Anchors Love, PA-C  Insulin Glargine (LANTUS) 100 UNIT/ML Solostar Pen Inject 5 Units into the skin daily at 10 pm. 07/09/15  Yes Maryann Mikhail, DO  Insulin Pen Needle 32G X 4 MM MISC Use with insulin pen to dispense insulin daily 07/09/15  Yes Maryann Mikhail, DO  lisinopril (PRINIVIL,ZESTRIL) 10 MG tablet Take 1 tablet (10 mg total) by mouth daily. 07/09/15  Yes Maryann Mikhail, DO  Melatonin 3 MG TABS Take 6 mg by mouth at bedtime.   Yes Historical Provider, MD  Multiple Vitamins-Minerals (MULTIVITAMINS THER. W/MINERALS) TABS tablet Take 1 tablet by mouth daily.   Yes Historical Provider, MD  rivaroxaban (XARELTO) 20 MG TABS tablet Take 1 tablet (20 mg total) by mouth daily with supper. 07/09/15  Yes Maryann Mikhail, DO  senna-docusate (SENNA PLUS) 8.6-50 MG tablet Take 1 tablet by mouth 2 (two) times daily as needed for mild constipation.   Yes Historical Provider, MD  sodium chloride (OCEAN) 0.65 % SOLN nasal spray Place 1 spray into both nostrils 3 (three) times daily. To help with nose bleeds 06/06/15  Yes Bary Leriche, PA-C  thiamine 100 MG tablet Take 100 mg by mouth daily.   Yes Historical Provider, MD  zinc sulfate 220 MG capsule Take 220 mg by mouth daily at 6 PM.   Yes Historical Provider, MD  feeding supplement, ENSURE, (ENSURE) PUDG Take 1 Container by mouth 3 (three) times daily  between meals. Patient not taking: Reported on 08/29/2015 07/09/15   [name], DO  pantoprazole sodium (PROTONIX) 40 mg/20 mL PACK Take 20 mLs (40 mg total) by mouth daily. Patient not taking: Reported on 08/29/2015 07/09/15   Maryann Mikhail, DO   BP 145/74 mmHg  Pulse 76  Temp(Src) 98.1 F (36.7 C) (Oral)  Resp 20  SpO2 95% Physical Exam  Constitutional: She is oriented to person, place, and time. She appears well-developed and well-nourished.  HENT:  Head: Normocephalic and atraumatic.  Neck:  6-0 uncuffed tracheostomy tube in anterior neck. There is no significant surrounding erythema and a small amount  of dried blood at the stoma site.  Cardiovascular: Normal rate and regular rhythm.   No murmur heard. Pulmonary/Chest: Effort normal and breath sounds normal. No respiratory distress.  Abdominal: Soft. There is no tenderness. There is no rebound and no guarding.  Gastrostomy tube in place  Musculoskeletal: She exhibits no edema or tenderness.  Neurological: She is alert and oriented to person, place, and time.  Right hemiparesis  Skin: Skin is warm and dry.  Psychiatric: She has a normal mood and affect. Her behavior is normal.  Nursing note and vitals reviewed.   ED Course  Procedures (including critical care time) Labs Review Labs Reviewed - No data to display  Imaging Review Dg Chest 2 View  08/29/2015  CLINICAL DATA:  Tracheostomy replacement. EXAM: CHEST  2 VIEW COMPARISON:  June 27, 2015. FINDINGS: Stable cardiomediastinal silhouette. Tracheostomy appears to be in grossly good position. No pneumothorax or pleural effusion is noted. No acute pulmonary disease is noted. Bony thorax appears intact. IMPRESSION: Tracheostomy is in grossly good position. No acute cardiopulmonary abnormality seen. Electronically Signed   By: Marijo Conception, M.D.   On: 08/29/2015 16:38   I have personally reviewed and evaluated these images and lab results as part of my medical  decision-making.   EKG Interpretation None      MDM   Final diagnoses:  Unspecified tracheostomy complication [facility])    Patient here for evaluation following her tracheostomy coming out. She her tracheostomy is in place in the emergency department with no respiratory distress. There is no significant bleeding at apartment. Upper patient and family reassurance. Discussed home care, outpatient follow-up, return precautions.    Quintella Reichert, MD 08/30/15 [phone]

## 2015-08-29 NOTE — ED Notes (Signed)
Bed: WA02 Expected date:  Expected time:  Means of arrival:  Comments: 26F/trach check

## 2015-08-29 NOTE — Progress Notes (Addendum)
Rt called to room 2 in ED for trach pt. Rt setup trach collar 21% on pt. Pt in no distress family at bedside. Rt went over trach care with family. Vitals stable.

## 2015-08-29 NOTE — Discharge Instructions (Signed)
Tracheostomy Tube Safety °A tracheostomy tube (commonly known as trach tube) allows a person to breath without using his or her nose or mouth. A trach tube may be needed if: °· A person's airway is blocked by swelling, injury, tumor, foreign body, vocal cord problem, or severe narrowing of the trachea. °· A person needs long-term ventilation. °· A person has excess airway secretions requiring frequent suctioning. °If you have a trach, you must follow certain safety measures to keep yourself safe and free of infection. °SAFETY MEASURES °· Always carry your emergency travel kit with you when you leave the house. Your bag should include: °¨ A portable suction machine. °¨ Suction catheters. °¨ A mucus trap. °¨ A bulb syringe. °¨ Two trach tubes (one the same size and one smaller). °¨ Trach ties. °¨ Heat and moisture exchanger. °¨ Emergency phone numbers. °¨ Sterile water. °¨ 0.9% saline solution. °¨ Sterile gloves or hand sanitizer. °¨ Sterile gauze pads. °· Clean your stoma site as directed to prevent infection. °· Secure the trach tube exactly as directed to keep the tube from moving out of place. °· Suction the trach tube as often as directed and exactly as directed. °· Avoid dust, mold, tobacco smoke, other types of smoke, and fumes from cleaning solutions, such as ammonia or bleach. °· Cover your trach tube when using any kind of spray product or powder. It is important that you do not inhale the mist or powder. °· Do not sterilize plastic tubes or attempt to clean them in boiling water. They are to be used only once. °· Do not store replacement plastic trach tubes in a location in which the temperature is over 118° F (48° C). °· If you have a cuffed trach tube, do not over inflate the cuff. This can injure your trachea. It may also cause the cuff to extend past the end of the tube where it can restrict or block air flow. °· If you cannot remove your trach tube or the smaller tube that fits inside the trach tube  (inner cannula), do not force it. Call your caregiver. °· Use a humidifier at home to keep some moisture in the air and to prevent your airway and lungs from drying out. Clean the humidifier regularly to prevent buildup of mold and mildew. °· Do not put anything in your trach tube that should not be there. °· Keep your stoma and trach tube dry when you bathe or shower. Direct the shower spray at chest level and place a shower shield or protective covering over your trach tube. °· Keep clothing away from the trach tube except for a protective scarf. Clothing may block the trach tube. Avoid crew necks and turtlenecks. Wear v-neck shirts and open collar shirts or blouses. Do not wear clothes that shed fibers or lint. °· If going outside in very cold air, wear a filter to prevent cold air from entering the trach tube. You can also loosely cover the trach tube with a protective scarf, handkerchief, or gauze. This helps to warm the air as you breathe, so that the cold air does not irritate your trachea and lungs. It also helps to keep out dust or dirt on windy days. °· Sickness: °¨ Suction more frequently is you become sick. °¨ Drink enough fluids to keep your urine clear or pale yellow if you have a fever, vomiting, or diarrhea. °¨ If you vomit, cover your trach tube with a towel to keep vomit out of your airway. If you   think vomit may have entered the trach tube, suction right away.  Post CPR instructions and emergency numbers where they can be seen in an emergency. All of your caregivers must know CPR.  If you use a ventilator:  Routinely check the ventilator safety and sound alarms to be sure they work properly.  Be sure the ventilator tubes are properly placed so that they do not pull on the trach tube.  Do not twist or pull on the trach connector more than needed. This may cause discomfort or disconnect the ventilator tubes.  Hold the trach tube in place when hooking up or disconnecting the ventilator or  humidification tubing.  Always use an inner cannula without side openings (non-fenestrated) with the correct connector if the trach tube has one or more side openings (fenestrated trach tube).  Obtain ongoing support as needed to adjust to living with trach tube. SEEK IMMEDIATE MEDICAL CARE IF:   You have difficulty breathing even after suctioning and cleaning.  You have swelling, redness, warmth, drainage, or tenderness around the stoma.  You have a fever or persistent symptoms for more than 2 to 3 days.  You have a fever and your symptoms suddenly get worse.  You have chills or muscle aches.  You have nausea and vomiting.  You feel dizzy or feel faint.  You have difficulty swallowing.  You have unusual sounds coming from the airway or continue to cough after suctioning.  You have chest pain or have difficulty breathing (despite a clean and properly placed tube).  You have bleeding from the stoma.  You have bright red blood in your mucus.  Your tube becomes plugged and you cannot clear it.  Your tube falls out and cannot be reinserted.   This information is not intended to replace advice given to you by your health care provider. Make sure you discuss any questions you have with your health care provider.   Document Released: 03/03/2012 Document Reviewed: 03/03/2012 Elsevier Interactive Patient Education [employer].

## 2015-08-29 NOTE — ED Notes (Signed)
RT in room.

## 2015-08-29 NOTE — ED Notes (Signed)
Patient transported to X-ray 

## 2015-08-29 NOTE — ED Notes (Signed)
Per EMS: pt was coughing and trach came out. Family placed trach back and wanted pt to be seen to make sure it was ok. Pt had minimal blood from replacement. EMS suctioned prior top arrival no blood noted.

## 2015-09-12 ENCOUNTER — Ambulatory Visit (HOSPITAL_COMMUNITY)
Admission: RE | Admit: 2015-09-12 | Discharge: 2015-09-12 | Disposition: A | Discharge status: Home / Self Care | Payer: BLUE CROSS/BLUE SHIELD | Source: Ambulatory Visit | Attending: Acute Care | Admitting: Acute Care

## 2015-09-12 ENCOUNTER — Other Ambulatory Visit: Payer: Self-pay | Admitting: Acute Care

## 2015-09-12 ENCOUNTER — Ambulatory Visit (HOSPITAL_COMMUNITY)
Admit: 2015-09-12 | Discharge: 2015-09-12 | Disposition: A | Discharge status: Home / Self Care | Payer: BLUE CROSS/BLUE SHIELD | Source: Ambulatory Visit | Attending: Internal Medicine | Admitting: Internal Medicine

## 2015-09-12 DIAGNOSIS — J209 Acute bronchitis, unspecified: Secondary | ICD-10-CM | POA: Insufficient documentation

## 2015-09-12 DIAGNOSIS — J189 Pneumonia, unspecified organism: Secondary | ICD-10-CM

## 2015-09-12 DIAGNOSIS — I69351 Hemiplegia and hemiparesis following cerebral infarction affecting right dominant side: Secondary | ICD-10-CM | POA: Insufficient documentation

## 2015-09-12 DIAGNOSIS — R042 Hemoptysis: Secondary | ICD-10-CM | POA: Diagnosis not present

## 2015-09-12 DIAGNOSIS — Z430 Encounter for attention to tracheostomy: Secondary | ICD-10-CM | POA: Diagnosis not present

## 2015-09-12 DIAGNOSIS — I69398 Other sequelae of cerebral infarction: Secondary | ICD-10-CM | POA: Diagnosis not present

## 2015-09-12 DIAGNOSIS — Z86718 Personal history of other venous thrombosis and embolism: Secondary | ICD-10-CM | POA: Diagnosis not present

## 2015-09-12 DIAGNOSIS — Z930 Tracheostomy status: Secondary | ICD-10-CM | POA: Diagnosis not present

## 2015-09-12 DIAGNOSIS — J398 Other specified diseases of upper respiratory tract: Secondary | ICD-10-CM | POA: Diagnosis not present

## 2015-09-12 MED ORDER — AMOXICILLIN-POT CLAVULANATE 875-125 MG PO TABS: 1.0000 | ORAL_TABLET | Freq: Two times a day (BID) | ORAL | Status: DC

## 2015-09-12 NOTE — Progress Notes (Addendum)
    HPI  This is a 60 year old female who presents to trach clinic today for f/u trach care. She has a sig h/o tracheostomy dependence s/p Left MCA CVA w/ long complicated course to follow marked by a long stay in LTAC, then CIR, then sepsis w/ MSSA HCAP, UTI, DVT and respiratory failure w/ difficult intubation. She was eventually d/c'd to home. Since home she has been doing well. Spoke to her home SLP via phone. She is doing better w/ improved voice quality w/ PMV. Has not had sig evidence of overt aspiration. However since about a week ago she notes increased cough, worsening shortness of breath and sputum production which was initially tan/grey, now since this am blood tinged and now w/ worsening hemoptysis. Presents today actually for routine trach change but also with these complaints.   Review of Systems:   Bolds are positive  Constitutional: weight loss, gain, night sweats, Fevers, chills, fatigue .  HEENT: headaches, Sore throat, sneezing, nasal congestion, post nasal drip, Difficulty swallowing, Tooth/dental problems, visual complaints visual changes, ear ache CV:  chest pain, radiates: ,Orthopnea, PND, swelling in lower extremities, dizziness, palpitations, syncope.  GI  heartburn, indigestion, abdominal pain, nausea, vomiting, diarrhea, change in bowel habits, loss of appetite, bloody stools.  Resp: cough, productive: , hemoptysis, dyspnea, chest pain, pleuritic.  Skin: rash or itching or icterus GU: dysuria, change in color of urine, urgency or frequency. flank pain, hematuria  MS: joint pain or swelling. decreased range of motion  Psych: change in mood or affect. depression or anxiety.  Neuro: difficulty with speech, weakness, numbness, ataxia   Exam: HR 84 Spo2 98 BP 124/62  General: 60 year old female, no acute distress. Sitting up in chair. HENT: #6 trach. Cuffless. Dried bloody tracheal secretions. Did not tolerate finger occlusion of trach.  MM otherwise clear. Pulm: scattered  rhonchi. + bloody tracheal secretions. + tactile frem. Card: rrr, no MRG. Ext: no edema. Dependent edema RUE. Neuro: awake. Alert. Still w/ right sided weakness. Abd: soft, not tender. + bowel sounds. GU: voids. Incont.   Trach change:  Trach Brand: Shiley Size: 6 Style: Uncuffed Secured by: Velcro Impression/plan  PCXR: personally reviewed. Trach good position. No infiltrates.   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 I wonder if she is still aspirating at home. She is a poor candidate for decannulation. If nothing changes I would not recommend this. We MIGHT consider down-sizing but the fact that this is her third time on abx since her d/c I am concerned that removing the trach would be risky.    Plan -spoke w/ [facility] SLP: cont current rx re: PMV -7d augmentin for tracheobronchitis  -will see how she looks on next trach f/u. Will consider down-size to #4; BUT would not consider De-cannulation until she is seen by ENT. Think see needs Laryngoscopy to eval for tracheal stenosis and to further eval cords prior to decannulation.   Erick Colace ACNP-BC [place] Pager # [phone] OR # [phone] if no answer

## 2015-09-12 NOTE — Progress Notes (Signed)
Tracheostomy Procedure Note  Kerry [name] [id number] 06-27-1955  Pre Procedure Tracheostomy Information  Trach Brand: Shiley Size: 6 Style: Uncuffed Secured by: Velcro   Procedure: pre vitals- HR 84 Spo2 98 BP 124/62    Post Procedure Tracheostomy Information  Trach Brand: Shiley Size: 6 Style: Uncuffed Secured by: Velcro   Post Procedure Evaluation:  ETCO2 positive color change from yellow to purple : Yes.   Vital signs:blood pressure 127/67, pulse 81, respirations 20 and pulse oximetry 98 % Patients current condition: stable Complications: No apparent complications Trach site exam: clean Wound care done: 4 x 4 gauze Patient did tolerate procedure well.   Education: none  Prescription needs: Antibiotic perscribed    Additional needs: none

## 2015-10-16 ENCOUNTER — Other Ambulatory Visit: Payer: Self-pay | Admitting: Acute Care

## 2015-10-16 ENCOUNTER — Telehealth: Payer: Self-pay | Admitting: Acute Care

## 2015-10-16 MED ORDER — DOXYCYCLINE MONOHYDRATE 100 MG PO TABS: 100.0000 mg | ORAL_TABLET | Freq: Two times a day (BID) | ORAL | Status: DC

## 2015-11-15 ENCOUNTER — Ambulatory Visit: Payer: BLUE CROSS/BLUE SHIELD | Admitting: Neurology

## 2015-11-23 ENCOUNTER — Other Ambulatory Visit: Payer: Self-pay | Admitting: Acute Care

## 2015-11-23 ENCOUNTER — Telehealth: Payer: Self-pay | Admitting: Acute Care

## 2015-11-23 MED ORDER — SALINE SPRAY 0.65 % NA SOLN: 1.0000 | Freq: Three times a day (TID) | NASAL | Status: DC

## 2015-11-23 MED ORDER — FLUTICASONE PROPIONATE 50 MCG/ACT NA SUSP: 2.0000 | Freq: Every day | NASAL | Status: DC

## 2015-11-28 ENCOUNTER — Ambulatory Visit (HOSPITAL_COMMUNITY)
Admission: RE | Admit: 2015-11-28 | Discharge: 2015-11-28 | Disposition: A | Discharge status: Home / Self Care | Payer: BLUE CROSS/BLUE SHIELD | Source: Ambulatory Visit | Attending: Acute Care | Admitting: Acute Care

## 2015-11-28 ENCOUNTER — Other Ambulatory Visit: Payer: Self-pay | Admitting: Acute Care

## 2015-11-28 DIAGNOSIS — J411 Mucopurulent chronic bronchitis: Secondary | ICD-10-CM | POA: Insufficient documentation

## 2015-11-28 DIAGNOSIS — Z8673 Personal history of transient ischemic attack (TIA), and cerebral infarction without residual deficits: Secondary | ICD-10-CM | POA: Insufficient documentation

## 2015-11-28 DIAGNOSIS — R05 Cough: Secondary | ICD-10-CM | POA: Diagnosis present

## 2015-11-28 DIAGNOSIS — Z8701 Personal history of pneumonia (recurrent): Secondary | ICD-10-CM | POA: Diagnosis not present

## 2015-11-28 DIAGNOSIS — J398 Other specified diseases of upper respiratory tract: Secondary | ICD-10-CM | POA: Insufficient documentation

## 2015-11-28 DIAGNOSIS — J3800 Paralysis of vocal cords and larynx, unspecified: Secondary | ICD-10-CM | POA: Insufficient documentation

## 2015-11-28 DIAGNOSIS — R042 Hemoptysis: Secondary | ICD-10-CM | POA: Diagnosis not present

## 2015-11-28 DIAGNOSIS — B370 Candidal stomatitis: Secondary | ICD-10-CM | POA: Insufficient documentation

## 2015-11-28 DIAGNOSIS — Z430 Encounter for attention to tracheostomy: Secondary | ICD-10-CM | POA: Diagnosis not present

## 2015-11-28 DIAGNOSIS — Z930 Tracheostomy status: Secondary | ICD-10-CM | POA: Diagnosis not present

## 2015-11-28 MED ORDER — DOXYCYCLINE HYCLATE 100 MG PO TABS: 100.0000 mg | ORAL_TABLET | Freq: Two times a day (BID) | ORAL | Status: DC

## 2015-11-28 MED ORDER — NYSTATIN 100000 UNIT/GM EX OINT: 1.0000 "application " | TOPICAL_OINTMENT | Freq: Two times a day (BID) | CUTANEOUS | Status: DC

## 2015-11-28 NOTE — Progress Notes (Signed)
Patient ID: Kerry [name], female    DOB: [birth year], 60 y.o.   MRN: [id number]  CC: cough and shortness of breath  Subjective:  Still c/o cough, now bringing up bloody tracheal secretions.   HPI  60 year old female who is trach dependent after MCA CVA, further complicated by what is either tracheal stenosis or vocal cord injury. Followed here at the trach clinic for her trach needs. On 6/2 I was called by her husband who noted she had increased tracheal secretions & cough. He reported that the family had been sick and that she was having a significant amount of sinus pressure. She has long had a h/o allergies so because of this we decided to treat this initially w/ nasal steroids. Her son called the clinic again on 6/7 reporting that his mothers cough had not improved, she had low grade fever, her sputum was tan to blood tinged and they had to suction her several times over the last 2d. Because of this and the fact that she has had episodes of PNA in the past I requested that they come in. She reported to the trach clinic accompanied by her son and husband w/ the complaints as noted above. She looked ill appearing but was not in any distress.   Review of Systems  Constitutional: Positive for fever, activity change, appetite change and fatigue.  HENT: Positive for congestion and postnasal drip. Negative for dental problem, drooling, ear discharge, ear pain, facial swelling and mouth sores.   Eyes: Negative.   Respiratory: Positive for cough and shortness of breath.   Cardiovascular: Negative.   Endocrine: Negative.   Genitourinary: Negative.   Allergic/Immunologic: Positive for environmental allergies.  Neurological: Negative.   Hematological: Negative.   Psychiatric/Behavioral: Negative.    RR 24, BP 138/67, Sat % 97 RA, HR 69    Objective:   Physical Exam  Constitutional: She is oriented to person, place, and time. She appears well-developed and well-nourished. No distress.   HENT:  Head: Normocephalic and atraumatic.  Mouth/Throat: No oropharyngeal exudate.  Eyes: EOM are normal. Pupils are equal, round, and reactive to light. Right eye exhibits no discharge. Left eye exhibits no discharge.  Neck: Normal range of motion. Neck supple.  Trach erythremic around stoma   Cardiovascular: Normal rate and regular rhythm.  Exam reveals no friction rub.   No murmur heard. Pulmonary/Chest: She is in respiratory distress. She has no wheezes. She has rales.  Abdominal: Soft. Bowel sounds are normal. She exhibits no distension. There is no tenderness. There is no rebound.  Genitourinary: Guaiac negative stool. No vaginal discharge found.  Musculoskeletal: Normal range of motion. She exhibits no edema or tenderness.  Neurological: She is alert and oriented to person, place, and time. No cranial nerve deficit. Coordination normal.  Skin: Skin is warm and dry. She is not diaphoretic. No erythema. No pallor.  Psychiatric: She has a normal mood and affect.     PCXR  Was reviewed personally by myself. I do not see any infiltrate.     Assessment & Plan:  Tracheostomy dependent s/p L MCA CVA and  Vocal cord paralysis  Tracheal stenosis  Purulent Bronchitis  R/o PNA Hemoptysis  Candida stomatitis   Plan Empiric doxy as has has SA before (will be 10d course) Sputum culture sent will f/u-->want to be sure not MRSA  ROV 3 months Will touch base w/ her in next 3-4 d to be sure she is doing better and review lab results.

## 2015-11-28 NOTE — Progress Notes (Signed)
Tracheostomy Procedure Note  Kerry [name] [id number] [birth year]  Pre Procedure Tracheostomy Information  Trach Brand: Shiley Size: 6.0 cuffless Style: Uncuffed Secured by: Velcro   Procedure :Pt  Came in for routine check up an trach change, BP138/67, Sat 97% RA, HR 69, RR 22    Post Procedure Tracheostomy Information  Trach Brand: Shiley Size: 6.0 cuffless Style: Uncuffed Secured by: Velcro   Post Procedure Evaluation:  ETCO2 positive color change from yellow to purple : Yes.   Vital signs:blood pressure 138/65, pulse 69 respirations 22 and pulse oximetry 97% RA Patients current condition: stable Complications: No apparent complications Trach site exam: clean, dry, blood tinged, green and yellow drainage Wound care done: 4 x 4 gauze Patient did tolerate procedure well.   Education: none  Prescription needs: Laurey Arrow wrote 2 scripts    Additional needs: Gave pt. Suction cathers, extra trach, passy muir valve, extra gauge

## 2015-11-29 DIAGNOSIS — J411 Mucopurulent chronic bronchitis: Secondary | ICD-10-CM | POA: Insufficient documentation

## 2015-11-29 DIAGNOSIS — Z930 Tracheostomy status: Secondary | ICD-10-CM | POA: Insufficient documentation

## 2015-11-30 LAB — CULTURE, RESPIRATORY: SPECIAL REQUESTS: NORMAL

## 2015-11-30 LAB — CULTURE, RESPIRATORY W GRAM STAIN

## 2015-12-12 ENCOUNTER — Ambulatory Visit (HOSPITAL_COMMUNITY): Payer: BLUE CROSS/BLUE SHIELD

## 2015-12-29 IMAGING — CR DG ABD PORTABLE 1V
1 series · 1 of 1 positions shown · non-contrast
Comparison: 04/29/2015

CLINICAL DATA: Hematemesis.

EXAM:
PORTABLE ABDOMEN - 1 VIEW

[AP]
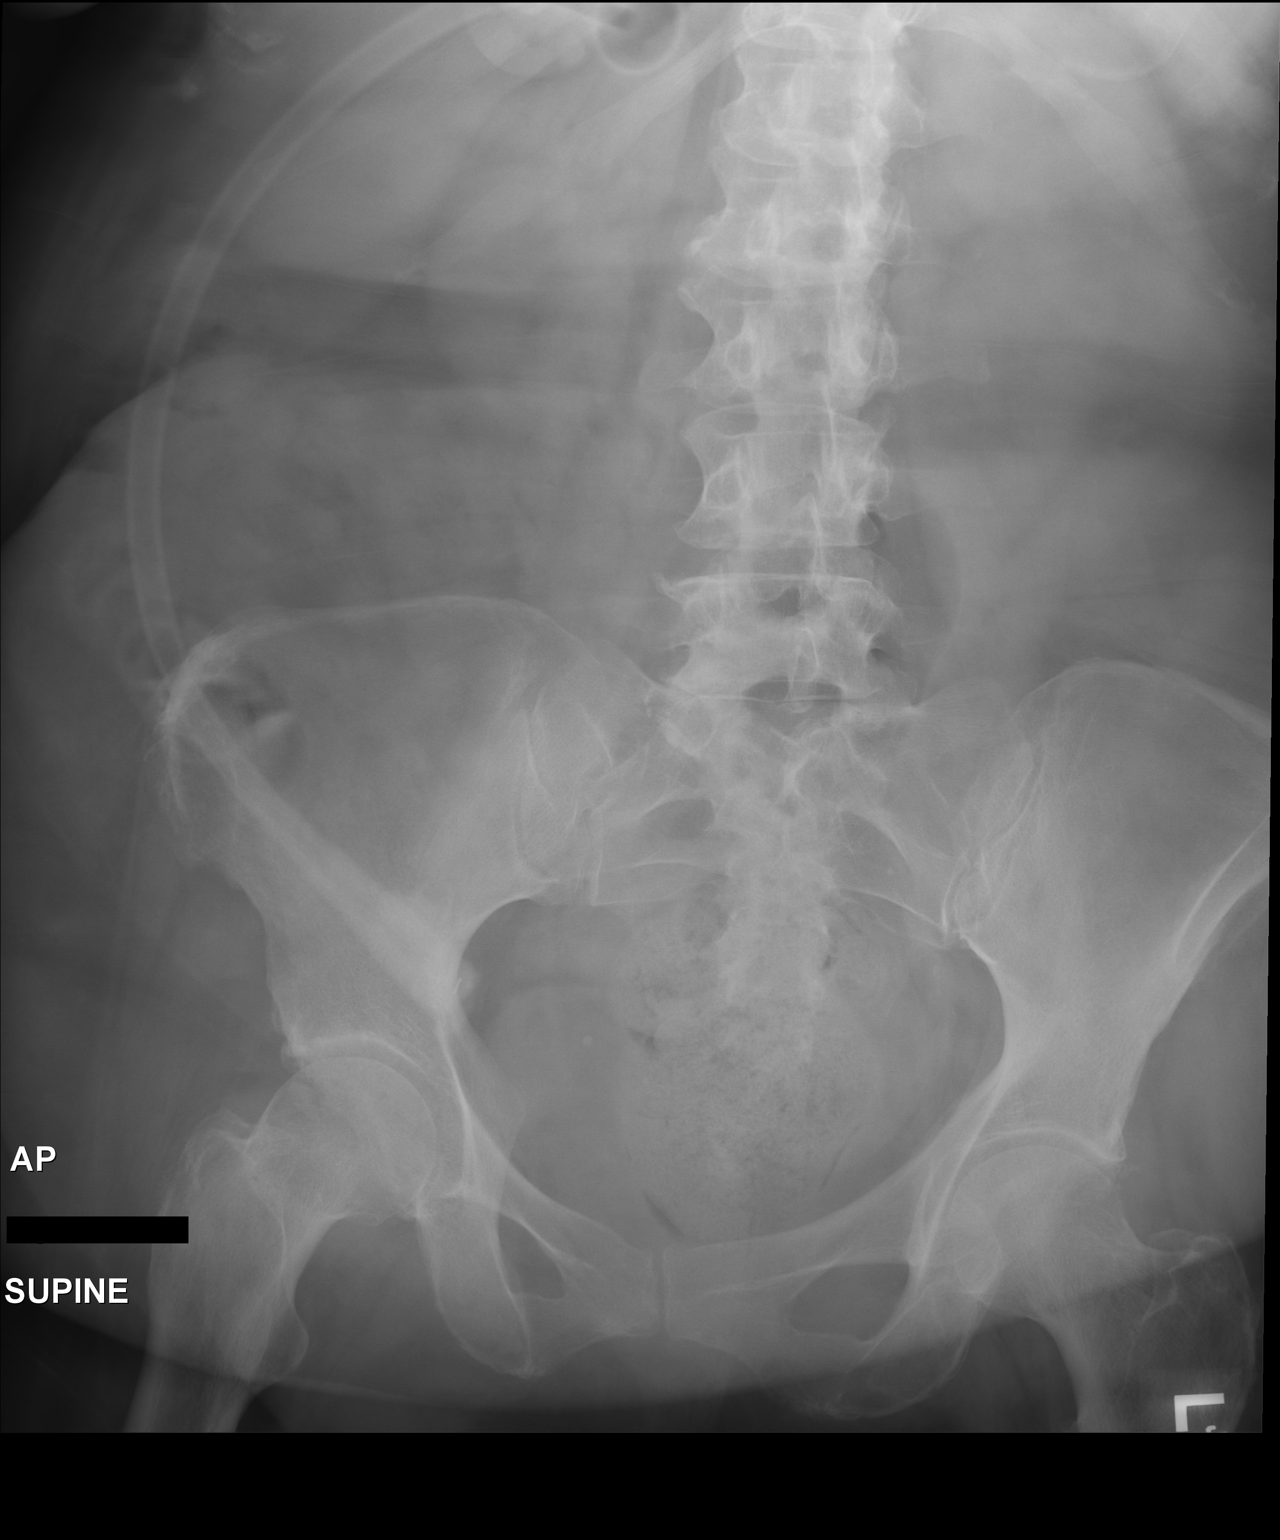

[1 of 1 positions shown; findings below may reference images not displayed]

FINDINGS: No evidence of bowel obstruction or ileus. Tube overlying the right
abdomen appears to represent a gastrostomy tube. The region of the
stomach is cut off at the top of the film.
IMPRESSION: No evidence of bowel obstruction or ileus.

## 2015-12-30 IMAGING — CR DG CHEST 2V
2 series · 2 of 2 positions shown · non-contrast
Comparison: 05/14/2015

CLINICAL DATA: Shortness of breath with exertion, personal history
of breast cancer, patient smokes

EXAM:
CHEST  2 VIEW

[chest lat]
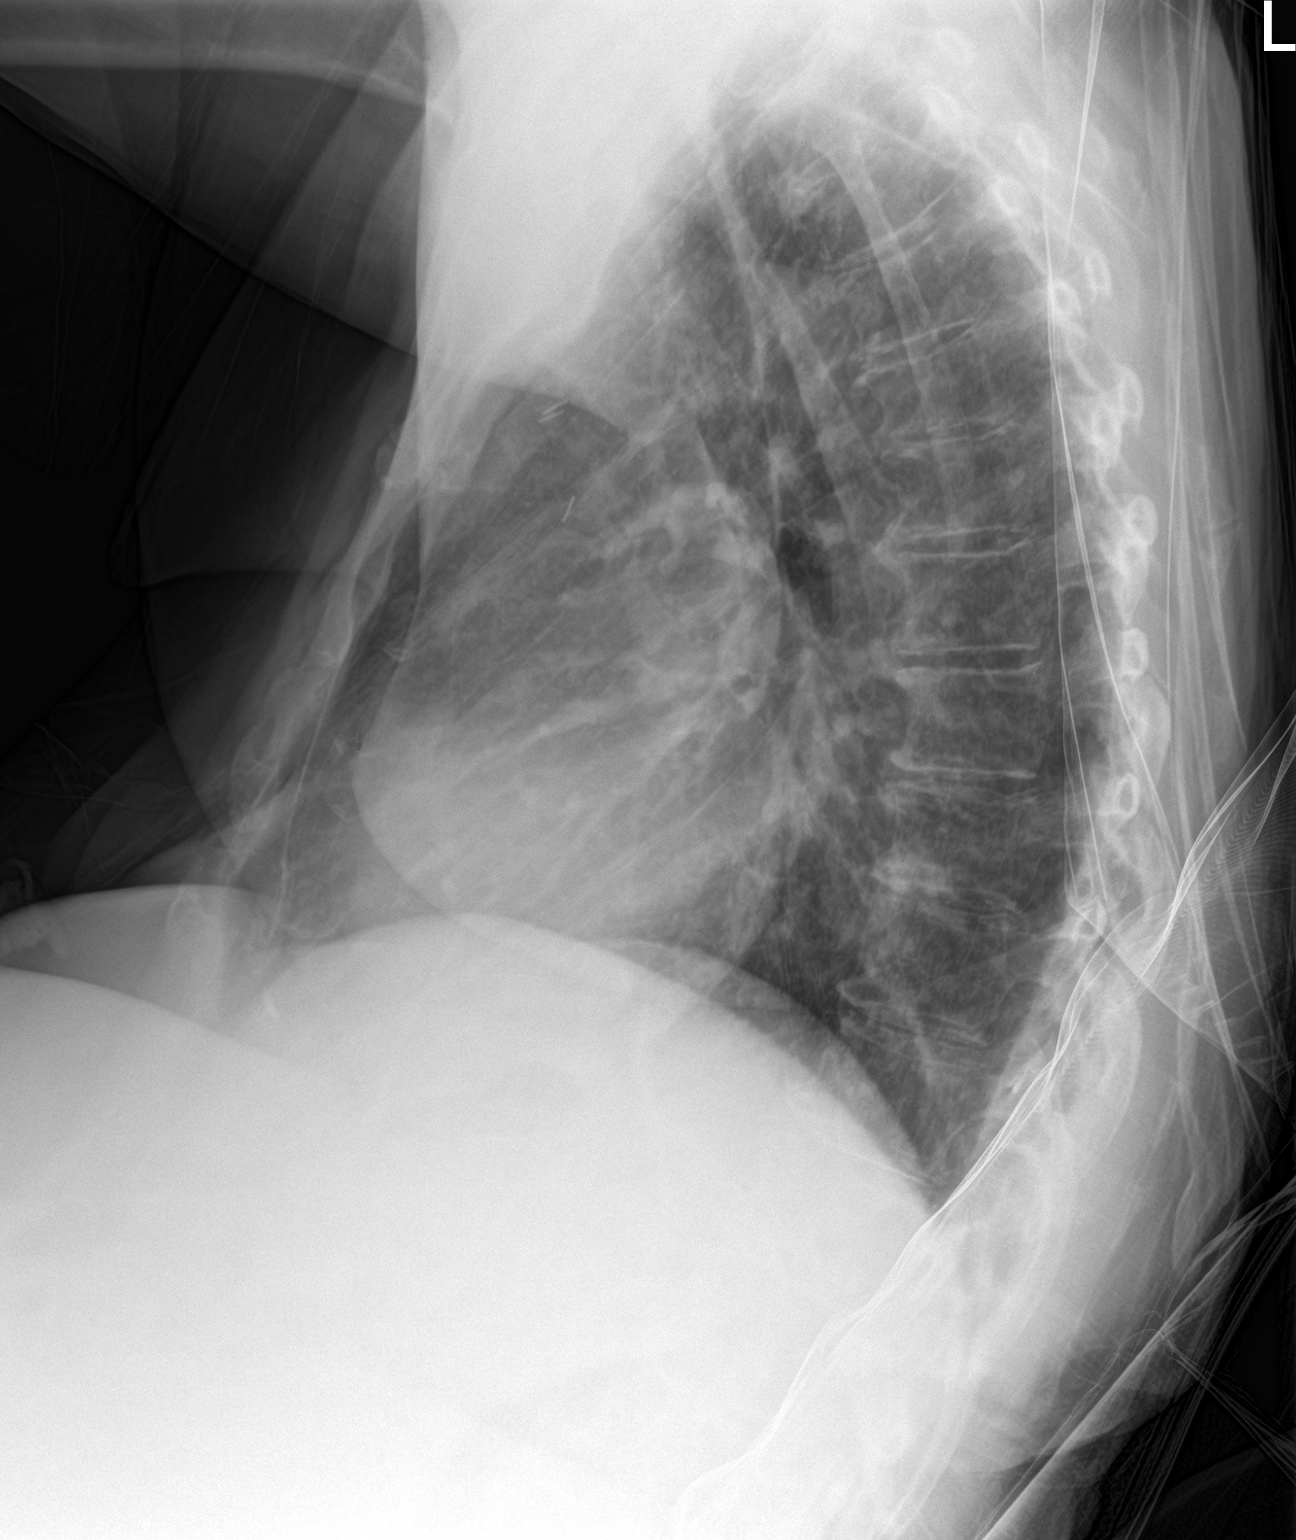

[chest ap]
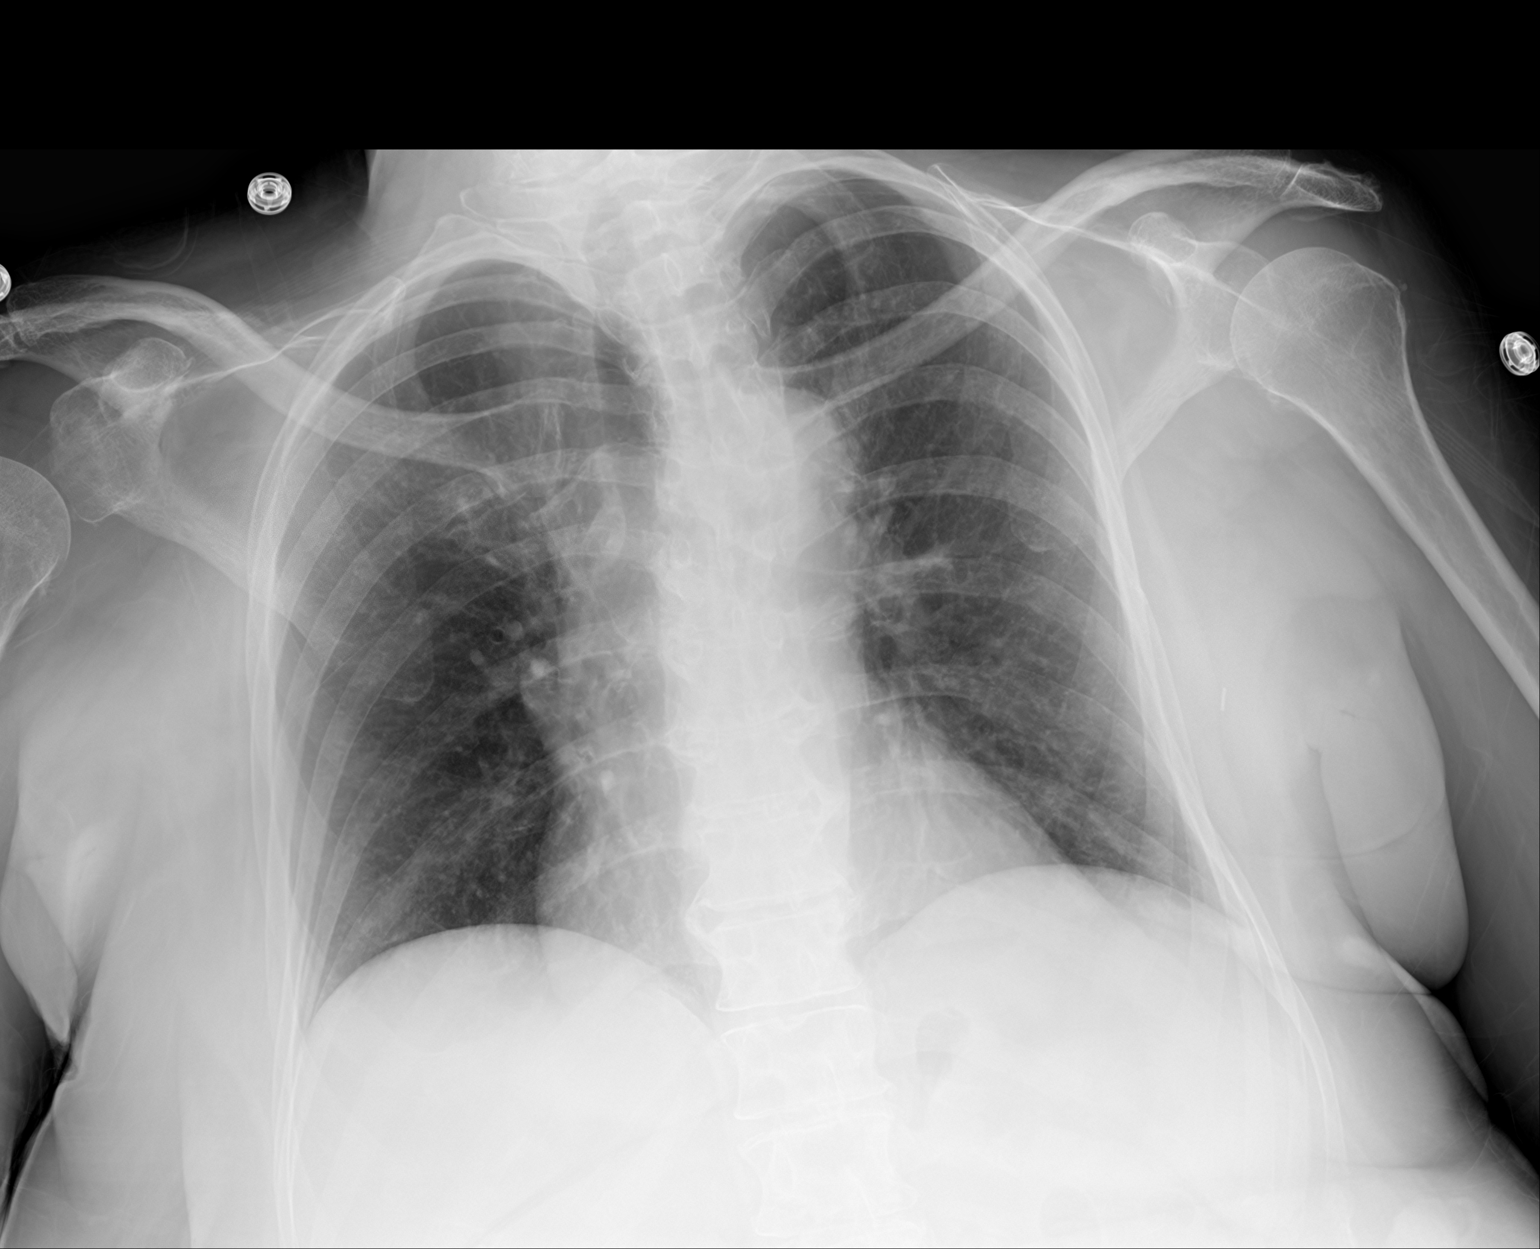

[2 of 2 positions shown; findings below may reference images not displayed]

FINDINGS: Stable mild cardiac enlargement. Vascular pattern normal. No
consolidation or effusion.
IMPRESSION: No active cardiopulmonary disease.

## 2016-02-13 ENCOUNTER — Inpatient Hospital Stay (HOSPITAL_COMMUNITY)
Admission: RE | Admit: 2016-02-13 | Discharge: 2016-02-13 | Disposition: A | Discharge status: Home / Self Care | Payer: BLUE CROSS/BLUE SHIELD | Source: Ambulatory Visit

## 2016-02-13 NOTE — Progress Notes (Signed)
    Patient ID: Kerry [name], female    DOB: 06-07-1956, 60 y.o.   MRN: [id number]  HPI  Review of Systems   Subjective:       Objective:   Physical Exam        Assessment & Plan:  No show for appointment  Erick Colace ACNP-BC [place] Pager # [phone] OR # [phone] if no answer

## 2016-02-15 ENCOUNTER — Other Ambulatory Visit: Payer: Self-pay | Admitting: Acute Care

## 2016-02-15 MED ORDER — DOXYCYCLINE HYCLATE 100 MG PO TABS: 100.0000 mg | ORAL_TABLET | Freq: Two times a day (BID) | ORAL | Status: DC

## 2016-02-15 NOTE — Progress Notes (Signed)
Spoke to [employer]

## 2016-02-20 ENCOUNTER — Other Ambulatory Visit: Payer: Self-pay | Admitting: Acute Care

## 2016-02-20 ENCOUNTER — Ambulatory Visit (HOSPITAL_COMMUNITY)
Admission: RE | Admit: 2016-02-20 | Discharge: 2016-02-20 | Disposition: A | Discharge status: Home / Self Care | Payer: BLUE CROSS/BLUE SHIELD | Source: Ambulatory Visit | Attending: Acute Care | Admitting: Acute Care

## 2016-02-20 DIAGNOSIS — Z930 Tracheostomy status: Secondary | ICD-10-CM

## 2016-02-20 DIAGNOSIS — R05 Cough: Secondary | ICD-10-CM | POA: Diagnosis not present

## 2016-02-20 DIAGNOSIS — Z430 Encounter for attention to tracheostomy: Secondary | ICD-10-CM | POA: Diagnosis present

## 2016-02-20 DIAGNOSIS — J3800 Paralysis of vocal cords and larynx, unspecified: Secondary | ICD-10-CM | POA: Insufficient documentation

## 2016-02-20 DIAGNOSIS — I6930 Unspecified sequelae of cerebral infarction: Secondary | ICD-10-CM | POA: Insufficient documentation

## 2016-02-20 MED ORDER — GUAIFENESIN 100 MG/5ML PO LIQD: 400.0000 mg | Freq: Two times a day (BID) | ORAL | 0 refills | Status: DC

## 2016-02-20 NOTE — Progress Notes (Signed)
Tracheostomy Procedure Note  Kerry [name] [id number] [birth year]  Pre Procedure Tracheostomy Information  Trach Brand: Shiley Size: 6.0 Style: Uncuffed Secured by: Velcro   Procedure: trach change and sputum culture    Post Procedure Tracheostomy Information  Trach Brand: Shiley Size: 6.0 Style: Uncuffed Secured by: Velcro   Post Procedure Evaluation:  ETCO2 positive color change from yellow to purple : Yes.   Vital signs:blood pressure 120/64, pulse73, respirations 18 and pulse oximetry 97% Patients current condition: stable Complications: No apparent complications Trach site exam: clean, dry Wound care done: clean Patient did tolerate procedure well.   Education: none  Prescription needs: robitussin   Additional needs: Sputum culture sent to lab

## 2016-02-23 LAB — CULTURE, RESPIRATORY W GRAM STAIN

## 2016-02-23 LAB — CULTURE, RESPIRATORY: SPECIAL REQUESTS: NORMAL

## 2016-02-26 DIAGNOSIS — R059 Cough, unspecified: Secondary | ICD-10-CM | POA: Insufficient documentation

## 2016-02-26 DIAGNOSIS — R05 Cough: Secondary | ICD-10-CM | POA: Insufficient documentation

## 2016-02-26 NOTE — Progress Notes (Signed)
   Subjective:    Patient ID: Kerry [name], female    DOB: 11/10/1955, 60 y.o.   MRN: [id number]  HPI  Kerry [name] reports today for her routine 3 month follow-up as well as w/ concern about increased cough (her son had called me last week about this). Per her family she had some increased nasal congestion and cough which has been more difficult to produce sputum with. They tell me that her mucous has been thick and she has required more suctioning. This however has seemed to spontaneously resolve over the last few days. She no longer has increased cough but does however still have thick mucous requiring suctioning from time to time.   Review of Systems  Constitutional: Negative for activity change, appetite change, chills, diaphoresis, fatigue and fever.  HENT: Positive for congestion, postnasal drip and rhinorrhea. Negative for dental problem, drooling, sinus pressure, sneezing and sore throat.   Eyes: Negative.   Respiratory: Positive for cough. Negative for apnea, chest tightness, shortness of breath and wheezing.   Cardiovascular: Negative.   Gastrointestinal: Positive for vomiting.       Vomited on ride to clinic today a/w car sickness.   Endocrine: Negative.   Genitourinary: Negative.   Musculoskeletal: Negative.   Psychiatric/Behavioral: Negative.     Vital signs:blood pressure 120/64, pulse73, respirations 18 and pulse oximetry 97%    Objective:   Physical Exam  Constitutional: She is oriented to person, place, and time. She appears well-developed and well-nourished. No distress.  HENT:  Head: Normocephalic and atraumatic.  Mouth/Throat: Oropharynx is clear and moist. No oropharyngeal exudate.  Eyes: Conjunctivae and EOM are normal. Pupils are equal, round, and reactive to light. Right eye exhibits no discharge. Left eye exhibits no discharge. No scleral icterus.  Neck: Normal range of motion. No JVD present.  Cardiovascular: Normal rate and regular rhythm.  Exam reveals no  gallop and no friction rub.   No murmur heard. Pulmonary/Chest: Effort normal and breath sounds normal. No stridor. No respiratory distress. She has no wheezes. She has no rales.  Abdominal: Soft. Bowel sounds are normal. She exhibits no distension. There is no tenderness.  Neurological: She is alert and oriented to person, place, and time.  Skin: Skin is warm and dry. No pallor.  Psychiatric: She has a normal mood and affect.   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   Erick Colace ACNP-BC [place] Pager # [phone] OR # [phone] if no answer

## 2016-03-18 NOTE — Addendum Note (Signed)
Encounter addended by: Erick Colace, NP on: 03/18/2016 12:34 PM<BR>    Actions taken: Sign clinical note

## 2016-04-12 ENCOUNTER — Emergency Department (HOSPITAL_COMMUNITY): Payer: BLUE CROSS/BLUE SHIELD

## 2016-04-12 ENCOUNTER — Encounter (HOSPITAL_COMMUNITY): Payer: Self-pay | Admitting: Emergency Medicine

## 2016-04-12 ENCOUNTER — Emergency Department (HOSPITAL_COMMUNITY)
Admission: EM | Admit: 2016-04-12 | Discharge: 2016-04-12 | Disposition: A | Discharge status: Home / Self Care | Payer: BLUE CROSS/BLUE SHIELD | Attending: Emergency Medicine | Admitting: Emergency Medicine

## 2016-04-12 DIAGNOSIS — I10 Essential (primary) hypertension: Secondary | ICD-10-CM | POA: Insufficient documentation

## 2016-04-12 DIAGNOSIS — Z794 Long term (current) use of insulin: Secondary | ICD-10-CM | POA: Insufficient documentation

## 2016-04-12 DIAGNOSIS — Z79899 Other long term (current) drug therapy: Secondary | ICD-10-CM | POA: Diagnosis not present

## 2016-04-12 DIAGNOSIS — F17200 Nicotine dependence, unspecified, uncomplicated: Secondary | ICD-10-CM | POA: Insufficient documentation

## 2016-04-12 DIAGNOSIS — Z853 Personal history of malignant neoplasm of breast: Secondary | ICD-10-CM | POA: Insufficient documentation

## 2016-04-12 DIAGNOSIS — R569 Unspecified convulsions: Secondary | ICD-10-CM | POA: Insufficient documentation

## 2016-04-12 DIAGNOSIS — Z7984 Long term (current) use of oral hypoglycemic drugs: Secondary | ICD-10-CM | POA: Diagnosis not present

## 2016-04-12 DIAGNOSIS — E119 Type 2 diabetes mellitus without complications: Secondary | ICD-10-CM | POA: Insufficient documentation

## 2016-04-12 DIAGNOSIS — I2510 Atherosclerotic heart disease of native coronary artery without angina pectoris: Secondary | ICD-10-CM | POA: Diagnosis not present

## 2016-04-12 DIAGNOSIS — Z7901 Long term (current) use of anticoagulants: Secondary | ICD-10-CM | POA: Diagnosis not present

## 2016-04-12 DIAGNOSIS — R930 Abnormal findings on diagnostic imaging of skull and head, not elsewhere classified: Secondary | ICD-10-CM | POA: Diagnosis not present

## 2016-04-12 DIAGNOSIS — Z8673 Personal history of transient ischemic attack (TIA), and cerebral infarction without residual deficits: Secondary | ICD-10-CM | POA: Diagnosis not present

## 2016-04-12 LAB — COMPREHENSIVE METABOLIC PANEL
ALT: 15 U/L (ref 14–54)
ANION GAP: 10 (ref 5–15)
AST: 12 U/L — ABNORMAL LOW (ref 15–41)
Albumin: 3.6 g/dL (ref 3.5–5.0)
Alkaline Phosphatase: 72 U/L (ref 38–126)
BUN: 8 mg/dL (ref 6–20)
CHLORIDE: 106 mmol/L (ref 101–111)
CO2: 24 mmol/L (ref 22–32)
Calcium: 9.1 mg/dL (ref 8.9–10.3)
Creatinine, Ser: 0.46 mg/dL (ref 0.44–1.00)
GFR calc non Af Amer: >60 mL/min (ref >60)
Glucose, Bld: 139 mg/dL — ABNORMAL HIGH (ref 65–99)
POTASSIUM: 3.6 mmol/L (ref 3.5–5.1)
SODIUM: 140 mmol/L (ref 135–145)
Total Bilirubin: 0.8 mg/dL (ref 0.3–1.2)
Total Protein: 6.7 g/dL (ref 6.5–8.1)

## 2016-04-12 LAB — CBC WITH DIFFERENTIAL/PLATELET
Basophils Absolute: 0 10*3/uL (ref 0.0–0.1)
Basophils Relative: 0 %
Eosinophils Absolute: 0.1 10*3/uL (ref 0.0–0.7)
Eosinophils Relative: 1 %
HEMATOCRIT: 42.3 % (ref 36.0–46.0)
HEMOGLOBIN: 13.9 g/dL (ref 12.0–15.0)
LYMPHS ABS: 2.3 10*3/uL (ref 0.7–4.0)
LYMPHS PCT: 25 %
MCH: 27.7 pg (ref 26.0–34.0)
MCHC: 32.9 g/dL (ref 30.0–36.0)
MCV: 84.4 fL (ref 78.0–100.0)
MONOS PCT: 6 %
Monocytes Absolute: 0.6 10*3/uL (ref 0.1–1.0)
NEUTROS PCT: 68 %
Neutro Abs: 6.1 10*3/uL (ref 1.7–7.7)
Platelets: 230 10*3/uL (ref 150–400)
RBC: 5.01 MIL/uL (ref 3.87–5.11)
RDW: 13.8 % (ref 11.5–15.5)
WBC: 9.1 10*3/uL (ref 4.0–10.5)

## 2016-04-12 LAB — PROTIME-INR
INR: 1.65
Prothrombin Time: 19.7 seconds — ABNORMAL HIGH (ref 11.4–15.2)

## 2016-04-12 LAB — CBG MONITORING, ED: Glucose-Capillary: 133 mg/dL — ABNORMAL HIGH (ref 65–99)

## 2016-04-12 MED ORDER — ONDANSETRON HCL 4 MG/2ML IJ SOLN
4.0000 mg | Freq: Once | INTRAMUSCULAR | Status: AC
  Administered 2016-04-12: 4 mg via INTRAVENOUS
  Filled 2016-04-12: qty 2

## 2016-04-12 NOTE — ED Provider Notes (Signed)
CT without acute findings. Patient remains at neurologic baseline. Discussed with neurology and recommended outpatient follow-up. No antiepileptic medications at this time.   Julianne Rice, MD 04/12/16 [phone]

## 2016-04-12 NOTE — ED Triage Notes (Signed)
Pt in from home via [facility] after witnessed, possible seizure. Family states they saw her "leg and arm up in the air involuntarily, drooling from mouth". No hx of seizure, hx of DM (CBG 163 for EMS). Pt has trach, 96% on RA, nonverbal at baseline, alert

## 2016-04-12 NOTE — ED Provider Notes (Signed)
[facility] DEPT Provider Note   CSN: [id number] Arrival date & time: 04/12/16  1348     History   Chief Complaint Chief Complaint  Patient presents with  . Seizures    HPI Kerry [name] is a 60 y.o. female.  Pt presents to the ED today with a possible seizure.  The pt has a hx of a CVA with resulting right sided paralysis on 04/11/15.  She was d/c'd home with a trach and a peg.  Pt has remained trach dependent.  The trach was last changed on 02/20/16.  The pt was at home today when she had an episode that looked like a seizure to family.  The family said her left foot became twitchy and her left arm went up into the air.  She was not responsive for a few minutes.  The family said that she is back to normal now.      Past Medical History:  Diagnosis Date  . Breast cancer ([place]) 03/10/2007   Left breat  . Carpal tunnel syndrome on right   . Coronary artery disease   . Degenerative disc disease, cervical   . Degenerative disc disease, lumbar   . Diabetes mellitus   . Elevated troponin   . Fibromyalgia   . GIB (gastrointestinal bleeding)   . HTN (hypertension) 06/13/2015  . Hyperlipidemia   . Hypertension   . PE (pulmonary embolism)   . 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  . Stroke [facility]) 2009   paralyzed on right side  . UTI (lower urinary tract infection)     Patient Active Problem List   Diagnosis Date Noted  . Recurrent nonproductive cough   . Purulent bronchitis ([place])   . Tracheostomy status ([place])   . Tracheostomy dependence ([place])   . Acute bronchitis   . Tracheal stenosis   . Hemoptysis   . Pressure ulcer 07/04/2015  . Aspiration into airway   . Acute encephalopathy   . Infective urethritis   . Vocal cord paresis   . Cerebrovascular accident (CVA) due to embolism of right middle cerebral artery ([place])   . Left pontine stroke ([place])   . HCAP (healthcare-associated pneumonia)   . Encounter for  orogastric (OG) tube placement   . Endotracheally intubated   . Acute respiratory failure with hypoxia and hypercarbia ([place]) 06/19/2015  . Altered mental state   . Breathing difficulty   . Leukocytosis   . Tachycardia   . HTN (hypertension) 06/13/2015  . Mental status change   . PE (pulmonary embolism) 06/07/2015  . Elevated troponin 06/07/2015  . UTI (urinary tract infection) 06/07/2015  . Coffee ground emesis 06/07/2015  . Sepsis ([place]) 06/07/2015  . Pulmonary embolism without acute cor pulmonale ([place])   . Arterial hypotension   . Hypoxemia   . Stridor   . Sinus tachycardia 06/06/2015  . Vomiting blood   . Stroke due to thrombosis of left middle cerebral artery ([place]) 05/16/2015  . Hemiparesis, aphasia, and dysphagia as late effect of cerebrovascular accident (CVA) ([place])   . Essential hypertension   . Coronary artery disease involving native coronary artery of native heart with angina pectoris ([place])   . History of breast cancer   . Fibromyalgia   . Chronic pain syndrome   . Generalized anxiety disorder   . Prerenal azotemia   . Pneumonia   . Chronic respiratory failure ([place])   . Respiratory failure ([place])   . Hypokalemia   .  Compromised airway   . HLD (hyperlipidemia)   . Type 2 diabetes mellitus with circulatory disorder ([place])   . Sinus bradycardia   . Asystole ([place])   . Acute respiratory failure ([place])   . Cerebral hemorrhage ([place])   . Cytotoxic cerebral edema ([place])   . Sinus pause   . Cerebrovascular accident (CVA) due to occlusion of left middle cerebral artery ([place])   . Cerebrovascular accident (CVA) due to thrombosis of precerebral artery ([place])   . Encounter for central line placement   . Encounter for feeding tube placement   . CVA (cerebral infarction) 04/11/2015  . Stroke (cerebrum) ([place]) 04/11/2015  . Aphasia   . Flaccid hemiplegia and hemiparesis   . Hypertensive urgency   . Uncontrolled type 2 diabetes mellitus with hyperosmolarity without coma, without  long-term current use of insulin ([place])   . Breast cancer ([place]) 11/08/2012    Past Surgical History:  Procedure Laterality Date  . APPENDECTOMY    . BREAST SURGERY    . CARDIAC CATHETERIZATION  2005  . CARDIAC CATHETERIZATION N/A 04/13/2015   Procedure: Temporary Pacemaker;  Surgeon: Peter M Martinique, MD;  Location: [facility];  Service: Cardiovascular;  Laterality: N/A;  . CARDIAC SURGERY    . ESOPHAGOGASTRODUODENOSCOPY N/A 04/18/2015   Procedure: ESOPHAGOGASTRODUODENOSCOPY (EGD);  Surgeon: Judeth Horn, MD;  Location: [facility];  Service: General;  Laterality: N/A;  . MASTECTOMY Left 03/2007  . PEG PLACEMENT N/A 04/18/2015   Procedure: PERCUTANEOUS ENDOSCOPIC GASTROSTOMY (PEG) PLACEMENT;  Surgeon: Judeth Horn, MD;  Location: [facility];  Service: General;  Laterality: N/A;  bedside  . RADIOLOGY WITH ANESTHESIA N/A 04/11/2015   Procedure: RADIOLOGY WITH ANESTHESIA;  Surgeon: Luanne Bras, MD;  Location: [facility];  Service: Radiology;  Laterality: N/A;  . TUBAL LIGATION Right   . VEIN SURGERY Right 2005    OB History    No data available       Home Medications    Prior to Admission medications   Medication Sig Start Date End Date Taking? Authorizing Provider  acetaminophen (TYLENOL) 160 MG/5ML solution Take 10.2-20.3 mLs (325-650 mg total) by mouth every 4 (four) hours as needed for mild pain. 06/06/15   Ivan Anchors Love, PA-C  amLODipine (NORVASC) 10 MG tablet Take 1 tablet (10 mg total) by mouth daily. 07/09/15   Maryann Mikhail, DO  amoxicillin-clavulanate (AUGMENTIN) 875-125 MG tablet Take 1 tablet by mouth 2 (two) times daily. 09/12/15   Erick Colace, NP  atorvastatin (LIPITOR) 40 MG tablet Take 1 tablet (40 mg total) by mouth daily at 6 PM. 07/09/15   Maryann Mikhail, DO  carvedilol (COREG) 12.5 MG tablet Take 1 tablet (12.5 mg total) by mouth 2 (two) times daily with a meal. 07/09/15   Maryann Mikhail, DO  citalopram (CELEXA) 20 MG tablet Take 20 mg by mouth daily.  07/28/15   Historical Provider, MD  doxycycline (ADOXA) 100 MG tablet Take 1 tablet (100 mg total) by mouth 2 (two) times daily. 10/16/15   Erick Colace, NP  doxycycline (VIBRA-TABS) 100 MG tablet Take 1 tablet (100 mg total) by mouth 2 (two) times daily. 11/28/15   Erick Colace, NP  feeding supplement, ENSURE, (ENSURE) PUDG Take 1 Container by mouth 3 (three) times daily between meals. Patient not taking: Reported on 08/29/2015 07/09/15   Maryann Mikhail, DO  fluticasone (FLONASE) 50 MCG/ACT nasal spray Place 2 sprays into both nostrils daily. 11/23/15   Erick Colace, NP  folic acid (FOLVITE) 1  MG tablet Take 1 mg by mouth daily.    Historical Provider, MD  glipiZIDE (GLUCOTROL) 5 MG tablet Take 1 tablet (5 mg total) by mouth daily before breakfast. 07/09/15   Maryann Mikhail, DO  guaiFENesin (ROBITUSSIN) 100 MG/5ML liquid Take 20 mLs (400 mg total) by mouth 2 (two) times daily. 02/20/16   Erick Colace, NP  hydrocerin (EUCERIN) CREA Apply 1 application topically 2 (two) times daily. To bilateral feet 06/06/15   Ivan Anchors Love, PA-C  Insulin Glargine (LANTUS) 100 UNIT/ML Solostar Pen Inject 5 Units into the skin daily at 10 pm. 07/09/15   [name], DO  Insulin Pen Needle 32G X 4 MM MISC Use with insulin pen to dispense insulin daily 07/09/15   Maryann Mikhail, DO  lisinopril (PRINIVIL,ZESTRIL) 10 MG tablet Take 1 tablet (10 mg total) by mouth daily. 07/09/15   Maryann Mikhail, DO  Melatonin 3 MG TABS Take 6 mg by mouth at bedtime.    Historical Provider, MD  Multiple Vitamins-Minerals (MULTIVITAMINS THER. W/MINERALS) TABS tablet Take 1 tablet by mouth daily.    Historical Provider, MD  nystatin ointment (MYCOSTATIN) Apply 1 application topically 2 (two) times daily. 11/28/15   Erick Colace, NP  pantoprazole sodium (PROTONIX) 40 mg/20 mL PACK Take 20 mLs (40 mg total) by mouth daily. Patient not taking: Reported on 08/29/2015 07/09/15   [name], DO  rivaroxaban (XARELTO) 20 MG TABS tablet  Take 1 tablet (20 mg total) by mouth daily with supper. 07/09/15   Maryann Mikhail, DO  senna-docusate (SENNA PLUS) 8.6-50 MG tablet Take 1 tablet by mouth 2 (two) times daily as needed for mild constipation.    Historical Provider, MD  sodium chloride (OCEAN) 0.65 % SOLN nasal spray Place 1 spray into both nostrils 3 (three) times daily. To help with nose bleeds 11/23/15   Erick Colace, NP  thiamine 100 MG tablet Take 100 mg by mouth daily.    Historical Provider, MD  zinc sulfate 220 MG capsule Take 220 mg by mouth daily at 6 PM.    Historical Provider, MD    Family History Family History  Problem Relation Age of Onset  . Cancer Mother     Breast cancer (right)  . Cancer Maternal Aunt     Breast cancer  . Cancer Maternal Aunt     Breast cancer  . Cancer Cousin     Breast Cancer  . Diabetes Sister     Social History Social History  Substance Use Topics  . Smoking status: Current Every Day Smoker    Packs/day: 0.25  . Smokeless tobacco: Never Used  . Alcohol use Yes     Comment: Social drinker     Allergies   Codeine; Oxycodone; and Oxycontin [oxycodone hcl]   Review of Systems Review of Systems  Neurological: Positive for seizures.  All other systems reviewed and are negative.    Physical Exam Updated Vital Signs BP 130/72   Pulse 77   Temp (!) 96.7 F (35.9 C) (Rectal)   Resp (!) 27   Ht 5\' 5"  (1.651 m)   Wt 163 lb (73.9 kg)   SpO2 95%   BMI 27.12 kg/m   Physical Exam  Constitutional: She appears well-developed and well-nourished.  HENT:  Head: Normocephalic and atraumatic.  Right Ear: External ear normal.  Left Ear: External ear normal.  Nose: Nose normal.  Mouth/Throat: Oropharynx is clear and moist.  Eyes: Conjunctivae and EOM are normal. Pupils are equal, round, and  reactive to light.  Neck: Normal range of motion. Neck supple.    Cardiovascular: Normal rate, regular rhythm, normal heart sounds and intact distal pulses.   Pulmonary/Chest:  Effort normal and breath sounds normal.  Abdominal: Soft. Bowel sounds are normal.  Musculoskeletal: Normal range of motion.  Neurological: She is alert.  Right sided paralysis (chronic).  Aphasia (chronic).  Nursing note and vitals reviewed.    ED Treatments / Results  Labs (all labs ordered are listed, but only abnormal results are displayed) Labs Reviewed  COMPREHENSIVE METABOLIC PANEL - Abnormal; Notable for the following:       Result Value   Glucose, Bld 139 (*)    AST 12 (*)    All other components within normal limits  PROTIME-INR - Abnormal; Notable for the following:    Prothrombin Time 19.7 (*)    All other components within normal limits  CBG MONITORING, ED - Abnormal; Notable for the following:    Glucose-Capillary 133 (*)    All other components within normal limits  CBC WITH DIFFERENTIAL/PLATELET  URINALYSIS, ROUTINE W REFLEX MICROSCOPIC (NOT AT [facility])    EKG  EKG Interpretation  Date/Time:  Saturday April 12 2016 13:57:32 EDT Ventricular Rate:  69 PR Interval:    QRS Duration: 81 QT Interval:  411 QTC Calculation: 441 R Axis:   14 Text Interpretation:  Sinus rhythm Low voltage, precordial leads Confirmed by Layten Aiken MD, Nikki Rusnak ([id number]) on 04/12/2016 2:01:45 PM       Radiology No results found.  Procedures Procedures (including critical care time)  Medications Ordered in ED Medications  ondansetron (ZOFRAN) injection 4 mg (4 mg Intravenous Given 04/12/16 1452)     Initial Impression / Assessment and Plan / ED Course  I have reviewed the triage vital signs and the nursing notes.  Pertinent labs & imaging results that were available during my care of the patient were reviewed by me and considered in my medical decision making (see chart for details).  Clinical Course   Pt signed out to Dr. Lita Mains pending the results to neurology.  Final Clinical Impressions(s) / ED Diagnoses   Final diagnoses:  None   Seizure like activity. New  Prescriptions New Prescriptions   No medications on file     Isla Pence, MD 04/13/16 [phone]

## 2016-04-12 NOTE — ED Notes (Signed)
Patient transported to X-ray 

## 2016-04-30 ENCOUNTER — Ambulatory Visit (HOSPITAL_COMMUNITY): Payer: BLUE CROSS/BLUE SHIELD

## 2016-05-07 ENCOUNTER — Inpatient Hospital Stay (HOSPITAL_COMMUNITY): Admission: RE | Admit: 2016-05-07 | Payer: BLUE CROSS/BLUE SHIELD | Source: Ambulatory Visit

## 2016-05-21 ENCOUNTER — Ambulatory Visit (HOSPITAL_COMMUNITY)
Admission: RE | Admit: 2016-05-21 | Discharge: 2016-05-21 | Disposition: A | Discharge status: Home / Self Care | Payer: BLUE CROSS/BLUE SHIELD | Source: Ambulatory Visit | Attending: Neurology | Admitting: Neurology

## 2016-05-21 ENCOUNTER — Other Ambulatory Visit: Payer: Self-pay | Admitting: Acute Care

## 2016-05-21 DIAGNOSIS — L03818 Cellulitis of other sites: Secondary | ICD-10-CM

## 2016-05-21 DIAGNOSIS — Z430 Encounter for attention to tracheostomy: Secondary | ICD-10-CM | POA: Insufficient documentation

## 2016-05-21 DIAGNOSIS — Z930 Tracheostomy status: Secondary | ICD-10-CM

## 2016-05-21 MED ORDER — CEPHALEXIN 250 MG PO CAPS: 500.0000 mg | ORAL_CAPSULE | Freq: Three times a day (TID) | ORAL | Status: AC

## 2016-05-21 NOTE — Progress Notes (Signed)
Tracheostomy Procedure Note  Kerry [name] [id number] 12/20/1955  Pre Procedure Tracheostomy Information  Trach Brand: Shiley Size: 6.0 Style: Uncuffed Secured by: Velcro   Procedure: trach change    Post Procedure Tracheostomy Information  Trach Brand: Shiley Size: 6.0 Style: Uncuffed Secured by: Sutures   Post Procedure Evaluation:  ETCO2 positive color change from yellow to purple : Yes.   Vital signs:blood pressure 126/69, pulse 79, respirations 18 and pulse oximetry 96 % Patients current condition: stable Complications: No apparent complications Trach site exam: clean, dry Wound care done: dry Patient did tolerate procedure well.   Education: None   Prescription needs: none    Additional needs: none

## 2016-05-21 NOTE — Progress Notes (Signed)
   Subjective:  ROV   Patient ID: Kerry [name], female    DOB: 09-13-1955, 60 y.o.   MRN: [id number]  HPI Kerry [name] returns today for routine trach care and trach change. Has been doing well from a pulmonary stand-point. Only real complaint is that she has had foul smelling purulent drainage from PEG stoma site.    Review of Systems  Constitutional: Negative.   HENT: Negative.   Eyes: Negative.   Respiratory: Negative.   Cardiovascular: Negative.   Gastrointestinal: Positive for nausea.       Almost always gets nauseated w/ her drive to clinic. This also gets worse again after trach change  Also has purulent drainage at the trach site   Endocrine: Negative.   Genitourinary: Negative.   Musculoskeletal: Negative.   Allergic/Immunologic: Negative.   Neurological: Negative.   Hematological: Negative.    Vital signs:blood pressure 126/69, pulse 79, respirations 18 and pulse oximetry 96 % Patients current condition: stable    Objective:   Physical Exam  Constitutional: She is oriented to person, place, and time. She appears well-developed and well-nourished.  HENT:  Head: Normocephalic and atraumatic.  Eyes: Conjunctivae and EOM are normal. Pupils are equal, round, and reactive to light. Right eye exhibits no discharge.  Neck: Normal range of motion. No tracheal deviation present. No thyromegaly present.  Cardiovascular: Normal rate, regular rhythm and normal heart sounds.  Exam reveals no friction rub.   No murmur heard. Pulmonary/Chest: Effort normal and breath sounds normal.  Abdominal: Soft. Bowel sounds are normal.  Musculoskeletal: Normal range of motion. She exhibits edema. She exhibits no tenderness.  Neurological: She is alert and oriented to person, place, and time.  Skin: Skin is warm and dry.  Psychiatric: She has a normal mood and affect.   Trach BrandCristy Hilts Size: 6.0 Style: Uncuffed Secured by: Velcro   Procedure: trach change    Post Procedure  Tracheostomy Information  Trach Brand: Shiley Size: 6.0 Style: Uncuffed     Assessment & Plan:  Tracheostomy status Cellulitis (at PEG stoma site)  Plan ROV 3 months for routine trach change Will change her to [place] at that time -->think this will be easier on the family for routine care as well as less irritating to the pt given its smaller cuff size.  Keflex called in for her cellulitis   Erick Colace ACNP-BC [place] Pager # [phone] OR # [phone] if no answer

## 2016-05-30 ENCOUNTER — Encounter: Payer: Self-pay | Admitting: Internal Medicine

## 2016-06-11 ENCOUNTER — Ambulatory Visit: Payer: 59 | Admitting: Nurse Practitioner

## 2016-06-19 ENCOUNTER — Encounter: Payer: Self-pay | Admitting: Neurology

## 2016-06-19 ENCOUNTER — Ambulatory Visit (INDEPENDENT_AMBULATORY_CARE_PROVIDER_SITE_OTHER): Payer: BLUE CROSS/BLUE SHIELD | Admitting: Neurology

## 2016-06-19 VITALS — BP 144/86 | HR 80 | Ht 64.0 in

## 2016-06-19 DIAGNOSIS — Z8673 Personal history of transient ischemic attack (TIA), and cerebral infarction without residual deficits: Secondary | ICD-10-CM

## 2016-06-19 DIAGNOSIS — R569 Unspecified convulsions: Secondary | ICD-10-CM | POA: Diagnosis not present

## 2016-06-19 DIAGNOSIS — R51 Headache: Secondary | ICD-10-CM | POA: Diagnosis not present

## 2016-06-19 DIAGNOSIS — R519 Headache, unspecified: Secondary | ICD-10-CM

## 2016-06-19 NOTE — Progress Notes (Signed)
NEUROLOGY CONSULTATION NOTE  Kerry [name] MRN: [id number] DOB: 1956/02/23  Referring provider: Dr. Isla Pence Primary care provider: Dr. Lucia Gaskins  Reason for consult:  Possible seizure  Dear Dr Theodora Blow:  Thank you for your kind referral of Kerry [name] for consultation of the above symptoms. Although her history is well known to you, please allow me to reiterate it for the purpose of our medical record. The patient was accompanied to the clinic by her son and daughter-in-law who also provide collateral information. Records and images were personally reviewed where available.  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 No clonic movements noted. They report her head was turned to the left side. She was crying but not making any other movements, she was not following commands to blink or nod her head. The episode lasted until EMS arrived (20 minutes), and she was coming out of it en route to [facility] ER, responding a lot better and moving her left side. Right hemiplegia was unchanged. In the ER, CBC and CMP were normal. I personally reviewed head CT without contrast which showed the large MCA stroke with mild atrophic changes, no acute abnormalities seen. She was discharged home to follow-up with Neurology.   No further similar symptoms since then. They deny any recent infections around that time. She is mostly bedbound and has her days and nights mixed up, but no change from previous sleep patterns. They deny  any episodes of staring/unresponsiveness, she has expressive > receptive aphasia and can speak with her passy-muir valve. Family reports at baseline she knows what she wants to say but can't get the words out, they feel she can understand everything and nods or shakes her head a certain way to express herself. She has been having headaches since the stroke, she constantly hold the left side of her head and family gives her Tylenol 2-3 times a day. She nods to say her headaches are throbbing (no prior history of headaches before the stroke), she has a little dizziness, she points to the front of her head. No associated nausea/vomiting with the headaches. She has a very good appetite and eats everything she wants. The right side of her body is numb, she denies any paresthesias on the left side. She is taking Tramadol for pain on her right side from post-stroke spasticity, but family reports she was not taking it yet in October when the episode occurred. She denies any olfactory/gustatory hallucinations, deja vu, rising epigastric sensation, myoclonic jerks.  Epilepsy Risk Factors:  Large left MCA stroke. Otherwise she had a normal birth and early development.  There is no history of febrile convulsions, CNS infections such as meningitis/encephalitis, significant traumatic brain injury, neurosurgical procedures, or family history of seizures.   PAST MEDICAL HISTORY: Past Medical History:  Diagnosis Date  . Breast cancer ([place]) 03/10/2007   Left breat  . Carpal tunnel syndrome on right   . Coronary artery disease   . Degenerative disc disease, cervical   . Degenerative disc disease, lumbar   . Diabetes  mellitus   . Elevated troponin   . Fibromyalgia   . GIB (gastrointestinal bleeding)   . HTN (hypertension) 06/13/2015  . Hyperlipidemia   . Hypertension   . PE (pulmonary embolism)   . 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  . Stroke [facility]) 2009    paralyzed on right side  . UTI (lower urinary tract infection)     PAST SURGICAL HISTORY: Past Surgical History:  Procedure Laterality Date  . APPENDECTOMY    . BREAST SURGERY    . CARDIAC CATHETERIZATION  2005  . CARDIAC CATHETERIZATION N/A 04/13/2015   Procedure: Temporary Pacemaker;  Surgeon: Peter M Martinique, MD;  Location: [facility];  Service: Cardiovascular;  Laterality: N/A;  . CARDIAC SURGERY    . ESOPHAGOGASTRODUODENOSCOPY N/A 04/18/2015   Procedure: ESOPHAGOGASTRODUODENOSCOPY (EGD);  Surgeon: Judeth Horn, MD;  Location: [place];  Service: General;  Laterality: N/A;  . MASTECTOMY Left 03/2007  . PEG PLACEMENT N/A 04/18/2015   Procedure: PERCUTANEOUS ENDOSCOPIC GASTROSTOMY (PEG) PLACEMENT;  Surgeon: Judeth Horn, MD;  Location: [place];  Service: General;  Laterality: N/A;  bedside  . RADIOLOGY WITH ANESTHESIA N/A 04/11/2015   Procedure: RADIOLOGY WITH ANESTHESIA;  Surgeon: Luanne Bras, MD;  Location: [employer];  Service: Radiology;  Laterality: N/A;  . TUBAL LIGATION Right   . VEIN SURGERY Right 2005    MEDICATIONS: Current Outpatient Prescriptions on File Prior to Visit  Medication Sig Dispense Refill  . acetaminophen (TYLENOL) 160 MG/5ML solution Take 10.2-20.3 mLs (325-650 mg total) by mouth every 4 (four) hours as needed for mild pain. 120 mL 0  . amLODipine (NORVASC) 10 MG tablet Take 1 tablet (10 mg total) by mouth daily. 30 tablet 0  . amoxicillin-clavulanate (AUGMENTIN) 875-125 MG tablet Take 1 tablet by mouth 2 (two) times daily. 14 tablet 0  . atorvastatin (LIPITOR) 40 MG tablet Take 1 tablet (40 mg total) by mouth daily at 6 PM. 30 tablet 0  . carvedilol (COREG) 12.5 MG tablet Take 1 tablet (12.5 mg total) by mouth 2 (two) times daily with a meal. 60 tablet 0  . citalopram (CELEXA) 20 MG tablet Take 20 mg by mouth daily.  3  . doxycycline (ADOXA) 100 MG tablet Take 1 tablet (100 mg total) by mouth 2 (two) times daily. 14 tablet 0  . doxycycline  (VIBRA-TABS) 100 MG tablet Take 1 tablet (100 mg total) by mouth 2 (two) times daily. 20 tablet 0  . feeding supplement, ENSURE, (ENSURE) PUDG Take 1 Container by mouth 3 (three) times daily between meals. (Patient not taking: Reported on 08/29/2015) 90 Can 0  . fluticasone (FLONASE) 50 MCG/ACT nasal spray Place 2 sprays into both nostrils daily. 16 g 2  . folic acid (FOLVITE) 1 MG tablet Take 1 mg by mouth daily.    Marland Kitchen glipiZIDE (GLUCOTROL) 5 MG tablet Take 1 tablet (5 mg total) by mouth daily before breakfast. 30 tablet 0  . guaiFENesin (ROBITUSSIN) 100 MG/5ML liquid Take 20 mLs (400 mg total) by mouth 2 (two) times daily. 120 mL 0  . hydrocerin (EUCERIN) CREA Apply 1 application topically 2 (two) times daily. To bilateral feet  0  . Insulin Glargine (LANTUS) 100 UNIT/ML Solostar Pen Inject 5 Units into the skin daily at 10 pm. 15 mL 1  . Insulin Pen Needle 32G X 4 MM MISC Use with insulin pen to dispense insulin daily 100 each 0  . lisinopril (PRINIVIL,ZESTRIL) 10 MG  tablet Take 1 tablet (10 mg total) by mouth daily. 30 tablet 0  . Melatonin 3 MG TABS Take 6 mg by mouth at bedtime.    . Multiple Vitamins-Minerals (MULTIVITAMINS THER. W/MINERALS) TABS tablet Take 1 tablet by mouth daily.    Marland Kitchen nystatin ointment (MYCOSTATIN) Apply 1 application topically 2 (two) times daily. 30 g 0  . pantoprazole sodium (PROTONIX) 40 mg/20 mL PACK Take 20 mLs (40 mg total) by mouth daily. (Patient not taking: Reported on 08/29/2015) 30 each 0  . rivaroxaban (XARELTO) 20 MG TABS tablet Take 1 tablet (20 mg total) by mouth daily with supper. 30 tablet 0  . senna-docusate (SENNA PLUS) 8.6-50 MG tablet Take 1 tablet by mouth 2 (two) times daily as needed for mild constipation.    . sodium chloride (OCEAN) 0.65 % SOLN nasal spray Place 1 spray into both nostrils 3 (three) times daily. To help with nose bleeds  0  . thiamine 100 MG tablet Take 100 mg by mouth daily.    Marland Kitchen zinc sulfate 220 MG capsule Take 220 mg by mouth daily  at 6 PM.     Current Facility-Administered Medications on File Prior to Visit  Medication Dose Route Frequency Provider Last Rate Last Dose  . doxycycline (VIBRA-TABS) tablet 100 mg  100 mg Oral Q12H Erick Colace, NP        ALLERGIES: Allergies  Allergen Reactions  . Codeine Nausea Only    Nausea?-- "made her sick"  . Oxycodone Nausea Only    Sick   . Oxycontin [Oxycodone Hcl] Nausea Only    Sick     FAMILY HISTORY: Family History  Problem Relation Age of Onset  . Cancer Mother     Breast cancer (right)  . Cancer Maternal Aunt     Breast cancer  . Cancer Maternal Aunt     Breast cancer  . Cancer Cousin     Breast Cancer  . Diabetes Sister     SOCIAL HISTORY: Social History   Social History  . Marital status: Married    Spouse name: N/A  . Number of children: N/A  . Years of education: N/A   Occupational History  . Not on file.   Social History Main Topics  . Smoking status: Current Every Day Smoker    Packs/day: 0.25  . Smokeless tobacco: Never Used  . Alcohol use Yes     Comment: Social drinker  . Drug use: No     Comment: Tried crack cocaine and marijuana in the past  . Sexual activity: No   Other Topics Concern  . Not on file   Social History Narrative  . No narrative on file    REVIEW OF SYSTEMS: Constitutional: No fevers, chills, or sweats, no generalized fatigue, change in appetite Eyes: No visual changes, double vision, eye pain Ear, nose and throat: No hearing loss, ear pain, nasal congestion, sore throat Cardiovascular: No chest pain, palpitations Respiratory:  No shortness of breath at rest or with exertion, wheezes GastrointestinaI: No nausea, vomiting, diarrhea, abdominal pain, fecal incontinence Genitourinary:  No dysuria, urinary retention or frequency Musculoskeletal:  No neck pain, back pain Integumentary: No rash, pruritus, skin lesions Neurological: as above Psychiatric: No depression, insomnia, anxiety Endocrine: No  palpitations, fatigue, diaphoresis, mood swings, change in appetite, change in weight, increased thirst Hematologic/Lymphatic:  No anemia, purpura, petechiae. Allergic/Immunologic: no itchy/runny eyes, nasal congestion, recent allergic reactions, rashes  PHYSICAL EXAM: Vitals:   06/19/16 0849  BP: (!) 144/86  Pulse: 80   General: No acute distress, sitting in wheelchair Head:  Normocephalic/atraumatic Eyes: Fundoscopic exam shows bilateral sharp discs, no vessel changes, exudates, or hemorrhages Neck: supple, no paraspinal tenderness, full range of motion Back: No paraspinal tenderness Heart: regular rate and rhythm Lungs: Clear to auscultation bilaterally. Vascular: No carotid bruits. Skin/Extremities: No rash, no edema Neurological Exam: Mental status: awake and alert, aphasic, mostly expressive aphasia with difficulty naming, repeating, but also some receptive aphasia with difficulty with 2-step commands and sometimes comprehending questions asked. Fund of knowledge is appropriate. Attention and concentration are normal.     Cranial nerves: CN I: not tested CN II: pupils equal, round and reactive to light, visual fields intact, fundi unremarkable. CN III, IV, VI:  full range of motion, no nystagmus, no ptosis CN V: facial sensation intact CN VII: right UMN facial weakness CN VIII: hearing intact to finger rub CN IX, X: gag intact, uvula midline CN XI: sternocleidomastoid and trapezius muscles intact CN XII: tongue midline Bulk & Tone: normal, no fasciculations. Motor: 0/5 on right UE and LE, 5/5 on left Sensation: absent sensation on right side.  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 Patient was crying and unable to answer as she usually does. The episode lasted 20 minutes. The episode is concerning for seizure, however the stroke was in the left hemisphere, which would typically cause right-sided seizures. This was discussed with patient and her family. MRI brain with and without contrast and a 1-hour EEG will be ordered as part of seizure workup. We have agreed to hold off on seizure medication for now, consideration for starting Topiramate or Zonisamide for headache prophylaxis and possible seizure will be done on her follow-up. Family was advised to minimize Tylenol to 2-3 a week to avoid rebound headaches. Family knows to call for any changes, she will follow-up after the tests.   Thank you for allowing me to participate in the care of this patient. Please do not hesitate to call for any questions or concerns.   Ellouise Newer, M.D.  CC: Dr. Gilford Raid, Dr. Cindie Laroche

## 2016-06-19 NOTE — Patient Instructions (Signed)
1. Schedule MRI brain with and without contrast 2. Schedule 1-hour EEG 3. Minimize Tylenol intake to 2-3 a week 4. Follow-up after tests  Seizure Precautions: 1. If medication has been prescribed for you to prevent seizures, take it exactly as directed.  Do not stop taking the medicine without talking to your doctor first, even if you have not had a seizure in a long time.   2. Avoid activities in which a seizure would cause danger to yourself or to others.  Don't operate dangerous machinery, swim alone, or climb in high or dangerous places, such as on ladders, roofs, or girders.  Do not drive unless your doctor says you may.  3. If you have any warning that you may have a seizure, lay down in a safe place where you can't hurt yourself.    4.  No driving for 6 months from last seizure, as per [facility].   Please refer to the following link on the [place] website for more information: http://www.epilepsyfoundation.org/answerplace/Social/driving/drivingu.cfm   5.  Maintain good sleep hygiene. Avoid alcohol.  6.  Contact your doctor if you have any problems that may be related to the medicine you are taking.  7.  Call 911 and bring the patient back to the ED if:        A.  The seizure lasts longer than 5 minutes.       B.  The patient doesn't awaken shortly after the seizure  C.  The patient has new problems such as difficulty seeing, speaking or moving  D.  The patient was injured during the seizure  E.  The patient has a temperature over 102 F (39C)  F.  The patient vomited and now is having trouble breathing

## 2016-06-25 ENCOUNTER — Ambulatory Visit (INDEPENDENT_AMBULATORY_CARE_PROVIDER_SITE_OTHER): Payer: BLUE CROSS/BLUE SHIELD | Admitting: Neurology

## 2016-06-25 DIAGNOSIS — R569 Unspecified convulsions: Secondary | ICD-10-CM

## 2016-06-25 DIAGNOSIS — Z8673 Personal history of transient ischemic attack (TIA), and cerebral infarction without residual deficits: Secondary | ICD-10-CM

## 2016-06-25 DIAGNOSIS — R51 Headache: Secondary | ICD-10-CM

## 2016-06-25 DIAGNOSIS — R519 Headache, unspecified: Secondary | ICD-10-CM

## 2016-06-29 ENCOUNTER — Encounter: Payer: Self-pay | Admitting: Neurology

## 2016-06-29 DIAGNOSIS — R569 Unspecified convulsions: Secondary | ICD-10-CM | POA: Insufficient documentation

## 2016-06-29 DIAGNOSIS — R51 Headache: Secondary | ICD-10-CM

## 2016-06-29 DIAGNOSIS — Z8673 Personal history of transient ischemic attack (TIA), and cerebral infarction without residual deficits: Secondary | ICD-10-CM | POA: Insufficient documentation

## 2016-06-29 DIAGNOSIS — R519 Headache, unspecified: Secondary | ICD-10-CM | POA: Insufficient documentation

## 2016-07-03 NOTE — Procedures (Signed)
ELECTROENCEPHALOGRAM REPORT  Date of Study: 06/25/2016  Patient's Name: Kerry [name] MRN: [id number] Date of Birth: [birth year]  Referring Provider: Dr. Ellouise Newer  Clinical History: This is a 61 year old woman with left MCA stroke who had an episode of left arm and leg tonic activity.   Medications: Tylenol, Norvasc, Augmentin, Lipitor, Coreg, Celexa, Vibra-tabs, Ensure, Flonase, Folvite, Glucotrol, Lantus, Prinivil, melatonin, Xarelto, thiamine  Technical Summary: A multichannel digital 1-hour EEG recording measured by the international 10-20 system with electrodes applied with paste and impedances below 5000 ohms performed in our laboratory with EKG monitoring in an awake and asleep patient.  Hyperventilation was not performed. Photic stimulation was performed.  The digital EEG was referentially recorded, reformatted, and digitally filtered in a variety of bipolar and referential montages for optimal display.    Description: The patient is awake and asleep during the recording.  During maximal wakefulness, there is an asymmetric, medium voltage 9 Hz posterior dominant rhythm better formed over the right occipital region, that attenuates with eye opening.  There is continuous polymorphic theta and delta slowing over the left hemisphere. There is additional independent occasional focal theta and delta slowing seen over the right temporal region. During drowsiness and sleep, there is an increase in theta and delta slowing of the background.  Vertex waves and asymmetric sleep spindles better formed over the right hemisphere were seen.  Photic stimulation did not elicit any abnormalities.  There were no epileptiform discharges or electrographic seizures seen.    EKG lead showed sinus bradycardia.  Impression: This 1-hour awake and asleep EEG is abnormal due to the presence of: 1. Focal slowing over the left hemisphere 2. Occasional focal slowing over the right temporal region  Clinical  Correlation of the above finding indicates focal cerebral dysfunction over the left hemisphere and right temporal region suggestive of underlying structural or physiologic abnormality. The absence of epileptiform discharges does not exclude a clinical diagnosis of epilepsy.  If further clinical questions remain, prolonged EEG may be helpful.  Clinical correlation is advised.   Ellouise Newer, M.D.

## 2016-07-09 ENCOUNTER — Ambulatory Visit: Payer: 59 | Admitting: Nurse Practitioner

## 2016-07-17 ENCOUNTER — Ambulatory Visit: Payer: BLUE CROSS/BLUE SHIELD | Admitting: Neurology

## 2016-07-28 ENCOUNTER — Ambulatory Visit: Payer: BLUE CROSS/BLUE SHIELD | Admitting: Nurse Practitioner

## 2016-08-04 ENCOUNTER — Ambulatory Visit: Payer: BLUE CROSS/BLUE SHIELD | Admitting: Neurology

## 2016-08-08 ENCOUNTER — Encounter: Payer: Self-pay | Admitting: Neurology

## 2016-08-14 ENCOUNTER — Encounter: Payer: Self-pay | Admitting: Nurse Practitioner

## 2016-08-14 ENCOUNTER — Ambulatory Visit (INDEPENDENT_AMBULATORY_CARE_PROVIDER_SITE_OTHER): Payer: BLUE CROSS/BLUE SHIELD | Admitting: Nurse Practitioner

## 2016-08-14 DIAGNOSIS — Z931 Gastrostomy status: Secondary | ICD-10-CM

## 2016-08-14 NOTE — Assessment & Plan Note (Signed)
The patient has had a PEG tube for about the past 2 years which was placed by the [facility] trauma service after CVA due to dysphagia. Patient family state that the tube is not been used in about a year. She has significant inadequate intake by mouth of food, fluids, medications. They're wanting the PEG tube to be removed. The site is mildly tender with some drainage on the gauze, no site area erythema, edema noted. I do not see any contraindications to removing PEG tube. I will discuss with Dr. Bethann Punches he is comfortable with removing this tube that was placed by trauma service. We will follow-up with the family and patient to notify if we can remove the tube. If not, can consider referral down to interventional radiology.

## 2016-08-14 NOTE — Progress Notes (Signed)
cc'ed to pcp °

## 2016-08-14 NOTE — Patient Instructions (Signed)
1. I will discuss her situation with Dr. Gala Romney to determine if he is able to remove your PEG tube. 2. If not, we will work to find somewhere that can remove it for you such as interventional radiology in [place].

## 2016-08-14 NOTE — Progress Notes (Signed)
Primary Care Physician:  [name], MD Primary Gastroenterologist:  Dr. Gala Romney  Chief Complaint  Patient presents with  . PEG REMOVEAL    HPI:   Kerry [name] is a 61 y.o. female who presents on referral from primary care for removal of PEG tube. It appears to PEG tube was placed 04/18/2015 by Dr. Judeth Horn of the trauma service due to dysphagia and CVA. Today she is accompanied by her son and daughter-in-law. They states she has not used the PEG tube in over a year. Is taking all foods and fluids by mouth. No issues with medications either swallowed whole or crushed in apple sauce. The area of the PEG tube has "a nasty odor and weird color" per family, wondering if its an infection or if aggravated from when they roll her over and it gets caught up. Denies chest pain, dyspnea, dizziness, lightheadedness, syncope, near syncope. Denies any other upper or lower GI symptoms.  She is on NOAC Xarelto for CVA prophylaxis which she will be on indefinitely.   Past Medical History:  Diagnosis Date  . Breast cancer ([place]) 03/10/2007   Left breat  . Carpal tunnel syndrome on right   . Coronary artery disease   . Degenerative disc disease, cervical   . Degenerative disc disease, lumbar   . Diabetes mellitus   . Elevated troponin   . Fibromyalgia   . GIB (gastrointestinal bleeding)   . HTN (hypertension) 06/13/2015  . Hyperlipidemia   . Hypertension   . PE (pulmonary embolism)   . 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  . Stroke [facility]) 2009   paralyzed on right side  . UTI (lower urinary tract infection)     Past Surgical History:  Procedure Laterality Date  . APPENDECTOMY    . BREAST SURGERY    . CARDIAC CATHETERIZATION  2005  . CARDIAC CATHETERIZATION N/A 04/13/2015   Procedure: Temporary Pacemaker;  Surgeon: Peter M Martinique, MD;  Location: [facility];  Service: Cardiovascular;  Laterality: N/A;  . CARDIAC  SURGERY    . ESOPHAGOGASTRODUODENOSCOPY N/A 04/18/2015   Procedure: ESOPHAGOGASTRODUODENOSCOPY (EGD);  Surgeon: Judeth Horn, MD;  Location: [facility];  Service: General;  Laterality: N/A;  . MASTECTOMY Left 03/2007  . PEG PLACEMENT N/A 04/18/2015   Procedure: PERCUTANEOUS ENDOSCOPIC GASTROSTOMY (PEG) PLACEMENT;  Surgeon: Judeth Horn, MD;  Location: [place];  Service: General;  Laterality: N/A;  bedside  . RADIOLOGY WITH ANESTHESIA N/A 04/11/2015   Procedure: RADIOLOGY WITH ANESTHESIA;  Surgeon: Luanne Bras, MD;  Location: [place];  Service: Radiology;  Laterality: N/A;  . TUBAL LIGATION Right   . VEIN SURGERY Right 2005    Current Outpatient Prescriptions  Medication Sig Dispense Refill  . amLODipine (NORVASC) 10 MG tablet Take 1 tablet (10 mg total) by mouth daily. 30 tablet 0  . atorvastatin (LIPITOR) 40 MG tablet Take 1 tablet (40 mg total) by mouth daily at 6 PM. 30 tablet 0  . carvedilol (COREG) 12.5 MG tablet Take 1 tablet (12.5 mg total) by mouth 2 (two) times daily with a meal. 60 tablet 0  . Cholecalciferol ([facility]S D3) 5000 units capsule Take 5,000 Units by mouth daily.    . citalopram (CELEXA) 20 MG tablet Take 20 mg by mouth daily.  3  . Insulin Glargine (LANTUS) 100 UNIT/ML Solostar Pen Inject 5 Units into the skin daily at 10 pm. 15 mL 1  . Insulin Pen  Needle 32G X 4 MM MISC Use with insulin pen to dispense insulin daily 100 each 0  . lisinopril (PRINIVIL,ZESTRIL) 10 MG tablet Take 1 tablet (10 mg total) by mouth daily. 30 tablet 0  . rivaroxaban (XARELTO) 20 MG TABS tablet Take 1 tablet (20 mg total) by mouth daily with supper. 30 tablet 0  . traMADol (ULTRAM) 50 MG tablet Take by mouth every 6 (six) hours as needed.     Current Facility-Administered Medications  Medication Dose Route Frequency Provider Last Rate Last Dose  . doxycycline (VIBRA-TABS) tablet 100 mg  100 mg Oral Q12H Erick Colace, NP        Allergies as of 08/14/2016 - Review Complete 08/14/2016    Allergen Reaction Noted  . Codeine Nausea Only 05/16/2015  . Oxycodone Nausea Only 09/13/2011  . Oxycontin [oxycodone hcl] Nausea Only 11/08/2012    Family History  Problem Relation Age of Onset  . Cancer Mother     Breast cancer (right)  . Cancer Maternal Aunt     Breast cancer  . Cancer Maternal Aunt     Breast cancer  . Cancer Cousin     Breast Cancer  . Diabetes Sister   . Colon cancer Neg Hx     Social History   Social History  . Marital status: Married    Spouse name: N/A  . Number of children: N/A  . Years of education: N/A   Occupational History  . Not on file.   Social History Main Topics  . Smoking status: Former Smoker    Packs/day: 0.25    Quit date: 08/14/2000  . Smokeless tobacco: Never Used  . Alcohol use Yes     Comment: Social drinker  . Drug use: No     Comment: Tried crack cocaine and marijuana in the past  . Sexual activity: No   Other Topics Concern  . Not on file   Social History Narrative  . No narrative on file    Review of Systems: General: Negative for anorexia, weight loss, fever, chills, fatigue, weakness. ENT: Negative for hoarseness, difficulty swallowing. CV: Negative for chest pain, angina, palpitations, peripheral edema.  Respiratory: Negative for dyspnea at rest, cough, sputum, wheezing. Breathing well with trach. GI: See history of present illness. MS: Negative for joint pain, low back pain.  Derm: Negative for rash or itching.  Endo: Negative for unusual weight change.  Heme: Negative for bruising or bleeding. Allergy: Negative for rash or hives.    Physical Exam: BP (!) 165/90   Pulse 80   Temp 98.4 F (36.9 C) (Oral)   Ht 5\' 4"  (1.626 m)   Wt 220 lb (99.8 kg)   BMI 37.76 kg/m  General:   Alert and oriented. Pleasant and cooperative. Well-nourished and well-developed.  Head:  Normocephalic and atraumatic. Eyes:  Without icterus, sclera clear and conjunctiva pink.  Ears:  Normal auditory  acuity. Throat/Neck:  Trach in place. Cardiovascular:  S1, S2 present without murmurs appreciated. Extremities without clubbing or edema. Respiratory:  Clear to auscultation bilaterally. No wheezes, rales, or rhonchi. No distress.  Gastrointestinal:  +BS, soft, non-tender and non-distended. No HSM noted. No guarding or rebound. No masses appreciated. PEG tube noted to anterior abdomen, tube with some material in it query fungus. PEG site minimally tender, some clear-grayish drainage noted on the gauze but no site erythema or edema. Rectal:  Deferred  Musculoskalatal:  Symmetrical without gross deformities. Skin:  Intact without significant lesions or rashes. Neurologic:  Right-sided paralysis Psych:  Alert and cooperative. Normal mood and affect. Heme/Lymph/Immune: No excessive bruising noted.    08/14/2016 10:13 AM   Disclaimer: This note was dictated with voice recognition software. Similar sounding words can inadvertently be transcribed and may not be corrected upon review.

## 2016-08-20 ENCOUNTER — Ambulatory Visit (HOSPITAL_COMMUNITY)
Admission: RE | Admit: 2016-08-20 | Discharge: 2016-08-20 | Disposition: A | Discharge status: Home / Self Care | Payer: BLUE CROSS/BLUE SHIELD | Source: Ambulatory Visit | Attending: Acute Care | Admitting: Acute Care

## 2016-08-20 DIAGNOSIS — Z8673 Personal history of transient ischemic attack (TIA), and cerebral infarction without residual deficits: Secondary | ICD-10-CM | POA: Insufficient documentation

## 2016-08-20 DIAGNOSIS — J3800 Paralysis of vocal cords and larynx, unspecified: Secondary | ICD-10-CM | POA: Diagnosis not present

## 2016-08-20 DIAGNOSIS — Z430 Encounter for attention to tracheostomy: Secondary | ICD-10-CM | POA: Insufficient documentation

## 2016-08-20 DIAGNOSIS — Z930 Tracheostomy status: Secondary | ICD-10-CM

## 2016-08-20 NOTE — Progress Notes (Signed)
Tracheostomy Procedure Note  Kerry [name] [id number] January 02, 1956  Pre Procedure Tracheostomy Information  Trach Brand: Shiley Size: 6.0 Style: Uncuffed Secured by: Velcro   Procedure: trach change    Post Procedure Tracheostomy Information  Trach Brand: Shiley Size: 6.0 Style: Uncuffed Secured by: Velcro   Post Procedure Evaluation:  ETCO2 positive color change from yellow to purple : Yes.   Vital signs:blood pressure 134/70, pulse 64, respirations 15 and pulse oximetry 100 % Patients current condition: stable Complications: No apparent complications Trach site exam: clean, dry Wound care done: dry Patient did tolerate procedure well.   Education:   Prescription needs:     Additional needs:

## 2016-08-20 NOTE — Progress Notes (Signed)
   Subjective:    Patient ID: Kerry [name], female    DOB: [birth year], 61 y.o.   MRN: [id number]  HPI Kerry [name] is well known to me. Reports to trach clinic today for routine trach change. No new issues. Has been doing well from a trach stand-point.    Review of Systems  All other systems reviewed and are negative.     Vital signs:blood pressure 134/70, pulse 64, respirations 15 and pulse oximetry 100 % Objective:   Physical Exam  Constitutional: She is oriented to person, place, and time. She appears well-developed and well-nourished. No distress.  HENT:  Head: Normocephalic and atraumatic.  Mouth/Throat: Oropharynx is clear and moist.  Eyes: EOM are normal. Pupils are equal, round, and reactive to light. No scleral icterus.  Neck: Normal range of motion. Neck supple. No tracheal deviation present. No thyromegaly present.  Cardiovascular: Normal rate, regular rhythm and normal heart sounds.  Exam reveals no friction rub.   No murmur heard. Pulmonary/Chest: Effort normal and breath sounds normal. No stridor. No respiratory distress. She has no wheezes.  Abdominal: Soft. Bowel sounds are normal. She exhibits no distension. There is no tenderness.  Musculoskeletal: Normal range of motion. She exhibits no edema or deformity.  Neurological: She is alert and oriented to person, place, and time.  Skin: Skin is warm and dry. No rash noted. She is not diaphoretic. No erythema.  Psychiatric: She has a normal mood and affect. Her behavior is normal.   Procedure:  Trach change.  Stoma was unremarkable. Pt tolerated well.  # 6 uncuffed shiley.      Assessment & Plan:  Tracheostomy status s/p CVA Vocal cord paralysis  Plan ROV 3 months Have asked that she try to use her PMV as much as possible during the day-time.   Erick Colace ACNP-BC [place] Pager # [phone] OR # [phone] if no answer

## 2016-08-21 ENCOUNTER — Telehealth: Payer: Self-pay

## 2016-08-21 NOTE — Telephone Encounter (Signed)
Pt's daughter-in-law called and said that pt seen EG last week and he was going to talk to Dr. Gala Romney about removing her feeding tube. She wants to know if EG has heard anything.   Routing to [employer].

## 2016-08-25 NOTE — Telephone Encounter (Signed)
Last I checked (I believe) we were trying to figure out which type of tube exactly was put in. The op note from trauma didn't say and I think we were trying to find this to decide.  Dr. Gala Romney, can we discuss when you get back?

## 2016-08-25 NOTE — Telephone Encounter (Signed)
Called and updated pt's husband.

## 2016-09-25 ENCOUNTER — Encounter: Payer: Self-pay | Admitting: Cardiology

## 2016-10-07 ENCOUNTER — Encounter: Payer: Self-pay | Admitting: Cardiology

## 2016-10-09 ENCOUNTER — Telehealth: Payer: Self-pay | Admitting: Internal Medicine

## 2016-10-09 NOTE — Telephone Encounter (Signed)
Someone called for the patient inquiring about her peg tube removal, they have not heard about it since April when it was initially discussed.  [phone] is her dons number, she can not speak

## 2016-10-09 NOTE — Telephone Encounter (Signed)
Routing to EG 

## 2016-10-10 NOTE — Telephone Encounter (Signed)
Please tell the patient we're still trying to find the exact type of PEG that was placed to determine if we can remove it here. If we're unable to find this information we may have to send her back to the people who placed it to have it removed.

## 2016-10-13 ENCOUNTER — Other Ambulatory Visit: Payer: Self-pay

## 2016-10-13 DIAGNOSIS — K9422 Gastrostomy infection: Secondary | ICD-10-CM

## 2016-10-13 NOTE — Telephone Encounter (Signed)
Yes please. Also, please notify the patient of the results of our exhaustive search. I will cc RMR for FYI. Thanks for your help.

## 2016-10-13 NOTE — Telephone Encounter (Signed)
Order has been put in and IR will call her to schedule

## 2016-10-13 NOTE — Telephone Encounter (Signed)
Spoke with Orland Mustard, pts spouse and he said it was ok to send the pt to IR to have it removed. Ginger, please refer pt.

## 2016-10-13 NOTE — Telephone Encounter (Signed)
Extensively reviewed her chart and no notation of type of tube used anywhere. Rosendo Gros spoke with Autumn at [facility] and was told it was a 14fr by [facility] but that was all the information they had. I spoke with Abigail Butts, nurse at [facility] and she wasn't sure what type was used either and Dr.Wyatt is out for 2 weeks. She recommended sending pt to IR to remove tube or if we feel pt needs to have it done at [facility], she will talk to doctor on call for Dr.Wyatt.  Eric, do you want to send the pt to IR?

## 2016-10-13 NOTE — Telephone Encounter (Signed)
communications noted

## 2016-10-20 ENCOUNTER — Ambulatory Visit: Payer: BLUE CROSS/BLUE SHIELD | Admitting: Neurology

## 2016-10-30 ENCOUNTER — Other Ambulatory Visit: Payer: Self-pay | Admitting: Acute Care

## 2016-10-30 ENCOUNTER — Ambulatory Visit (HOSPITAL_COMMUNITY)
Admission: RE | Admit: 2016-10-30 | Discharge: 2016-10-30 | Disposition: A | Discharge status: Home / Self Care | Payer: BLUE CROSS/BLUE SHIELD | Source: Ambulatory Visit | Attending: Acute Care | Admitting: Acute Care

## 2016-10-30 DIAGNOSIS — Z930 Tracheostomy status: Secondary | ICD-10-CM | POA: Diagnosis not present

## 2016-10-30 DIAGNOSIS — J309 Allergic rhinitis, unspecified: Secondary | ICD-10-CM | POA: Insufficient documentation

## 2016-10-30 DIAGNOSIS — H9201 Otalgia, right ear: Secondary | ICD-10-CM | POA: Diagnosis not present

## 2016-10-30 DIAGNOSIS — J3800 Paralysis of vocal cords and larynx, unspecified: Secondary | ICD-10-CM | POA: Insufficient documentation

## 2016-10-30 MED ORDER — FLUTICASONE PROPIONATE 50 MCG/ACT NA SUSP: 2.0000 | Freq: Every day | NASAL | 6 refills | Status: DC

## 2016-10-30 MED ORDER — FLUTICASONE PROPIONATE 50 MCG/ACT NA SUSP: 2.0000 | Freq: Every day | NASAL | Status: DC

## 2016-10-30 NOTE — Progress Notes (Signed)
   Subjective:  ROV   Patient ID: Kerry [name], female    DOB: Aug 23, 1955, 61 y.o.   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 She lives at home w/ family. Has actually been doing very well. Her son called me last week to report increased sputum production. She has not had fever but has had: head ache, right ear ache, some increase in cough and post nasal gtt. The family was concerned she was getting "sick". They had at first called the PCP who encouraged supportive treatment. Given Deangela's history I advised her that I would like to see her so that we could obtain a sputum culture. She presents today about 1 week early for her regular scheduled trach care/trach change visit.    Review of Systems  Constitutional: Positive for fatigue. Negative for activity change, appetite change, chills and fever.  HENT: Positive for congestion, ear pain, postnasal drip, rhinorrhea, sinus pain and sinus pressure. Negative for facial swelling, mouth sores, sore throat and trouble swallowing.   Eyes: Negative.   Respiratory: Positive for cough.   Cardiovascular: Negative.   Gastrointestinal: Negative.   Endocrine: Negative.   Genitourinary: Negative.   Musculoskeletal: Negative.   Neurological: Negative.   Hematological: Negative.   Psychiatric/Behavioral: Negative.       HR70 BP115/63 RR 12 SpO298%  Objective:   Physical Exam  Constitutional: She is oriented to person, place, and time. She appears well-developed and well-nourished.  HENT:  Head: Normocephalic and atraumatic.  Trach site unremarkable   Neck: Normal range of motion. Neck supple.  Cardiovascular: Normal rate and regular rhythm.   Pulmonary/Chest: Effort normal and breath sounds normal. No respiratory distress.  Abdominal: Soft. Bowel sounds are normal.  Musculoskeletal: Normal range of motion. She  exhibits no edema.  Neurological: She is alert and oriented to person, place, and time.  Skin: Skin is warm and dry.  Psychiatric: She has a normal mood and affect.   Trach BrandCristy Hilts Size: 6.0 Style: Uncuffed Secured by: Velcro    Assessment & Plan:  Tracheostomy dependence  Allergic rhinitis  r/o sinusitis  r/o tracheobronchitis   Discussion Vonette actually looks ok. She does have c/o right sided head ache and ear ache. Her sputum has been reported as more frequent but it's only a little yellow. I suspect that most of this is d/t sinusitis and post-nasal gtt. She could have a little infectious component but better to get sputum culture and treat only if actually grows an organism.   Plan Start claritin and flonase -instructed her to take the flonase BID for next week then daily after than  Have obtained sputum culture. IF + will treat.  ROV 3 months for trach change  I will touch base with her next week or sooner depending on results of sputum culture.   Erick Colace ACNP-BC [place] Pager # [phone] OR # [phone] if no answer

## 2016-10-30 NOTE — Progress Notes (Signed)
Tracheostomy Procedure Note  CAELIE REMSBURG [id number] 08-19-1955  Pre Procedure Tracheostomy Information  Trach Brand: Shiley Size: 6.0 Style: Uncuffed Secured by: Velcro HR70 BP115/63 RR 12 SpO298%  Procedure: trach change    Post Procedure Tracheostomy Information  Trach Brand: Shiley Size: 6.0 Style: Uncuffed Secured by: Velcro   Post Procedure Evaluation:  ETCO2 positive color change from yellow to purple : Yes.   Vital signs:blood pressure 107/59, pulse 68, respirations 12 and pulse oximetry 98 % Patients current condition: stable Complications: No apparent complications Trach site exam: clean, dry Wound care done: dry Patient did tolerate procedure well.   Education: none  Prescription needs: prescription given for flonase  Antibiotic depending culture results    Additional needs: Sputum sent for cultures.

## 2016-10-31 DIAGNOSIS — J309 Allergic rhinitis, unspecified: Secondary | ICD-10-CM | POA: Insufficient documentation

## 2016-11-01 LAB — CULTURE, RESPIRATORY W GRAM STAIN: Special Requests: NORMAL

## 2016-11-01 LAB — CULTURE, RESPIRATORY

## 2016-11-05 ENCOUNTER — Ambulatory Visit (HOSPITAL_COMMUNITY)
Admission: RE | Admit: 2016-11-05 | Discharge: 2016-11-05 | Disposition: A | Discharge status: Home / Self Care | Payer: BLUE CROSS/BLUE SHIELD | Source: Ambulatory Visit | Attending: Nurse Practitioner | Admitting: Nurse Practitioner

## 2016-11-05 ENCOUNTER — Ambulatory Visit (HOSPITAL_COMMUNITY): Payer: BLUE CROSS/BLUE SHIELD

## 2016-11-05 ENCOUNTER — Encounter (HOSPITAL_COMMUNITY): Payer: Self-pay | Admitting: Interventional Radiology

## 2016-11-05 DIAGNOSIS — Z431 Encounter for attention to gastrostomy: Secondary | ICD-10-CM | POA: Insufficient documentation

## 2016-11-05 DIAGNOSIS — K9422 Gastrostomy infection: Secondary | ICD-10-CM

## 2016-11-05 HISTORY — PX: IR GASTROSTOMY TUBE REMOVAL: IMG5492

## 2016-11-05 MED ORDER — LIDOCAINE VISCOUS 2 % MT SOLN
OROMUCOSAL | Status: AC
  Administered 2016-11-05: 15 mL
  Filled 2016-11-05: qty 15

## 2016-12-31 ENCOUNTER — Ambulatory Visit (HOSPITAL_COMMUNITY): Payer: BLUE CROSS/BLUE SHIELD

## 2017-01-28 ENCOUNTER — Inpatient Hospital Stay (HOSPITAL_COMMUNITY): Admission: RE | Admit: 2017-01-28 | Payer: BLUE CROSS/BLUE SHIELD | Source: Ambulatory Visit

## 2017-02-12 ENCOUNTER — Inpatient Hospital Stay (HOSPITAL_COMMUNITY): Admission: RE | Admit: 2017-02-12 | Payer: BLUE CROSS/BLUE SHIELD | Source: Ambulatory Visit

## 2017-02-19 ENCOUNTER — Ambulatory Visit (HOSPITAL_COMMUNITY)
Admission: RE | Admit: 2017-02-19 | Discharge: 2017-02-19 | Disposition: A | Discharge status: Home / Self Care | Payer: BLUE CROSS/BLUE SHIELD | Source: Ambulatory Visit | Attending: Acute Care | Admitting: Acute Care

## 2017-02-19 DIAGNOSIS — J310 Chronic rhinitis: Secondary | ICD-10-CM | POA: Insufficient documentation

## 2017-02-19 DIAGNOSIS — Z930 Tracheostomy status: Secondary | ICD-10-CM | POA: Insufficient documentation

## 2017-02-19 DIAGNOSIS — J328 Other chronic sinusitis: Secondary | ICD-10-CM | POA: Insufficient documentation

## 2017-02-19 DIAGNOSIS — J329 Chronic sinusitis, unspecified: Secondary | ICD-10-CM | POA: Diagnosis not present

## 2017-02-19 DIAGNOSIS — J3800 Paralysis of vocal cords and larynx, unspecified: Secondary | ICD-10-CM | POA: Diagnosis not present

## 2017-02-19 DIAGNOSIS — J309 Allergic rhinitis, unspecified: Secondary | ICD-10-CM | POA: Diagnosis not present

## 2017-02-19 MED ORDER — IPRATROPIUM BROMIDE 0.03 % NA SOLN: 2.0000 | Freq: Three times a day (TID) | NASAL | 2 refills | Status: DC

## 2017-02-19 NOTE — Progress Notes (Signed)
Tracheostomy Procedure Note  Kerry [name] [id number] June 07, 1956  Pre Procedure Tracheostomy Information  Trach Brand: Shiley Size: 6 Style: Uncuffed Secured by: Velcro   Procedure: sputum sample  Patients current condition: stable Complications: No apparent complications Patient did tolerate procedure well.   Education: None  Prescription needs: Atrovent nasal spray Son said he changed her trach last week.

## 2017-02-19 NOTE — Progress Notes (Signed)
   Subjective:  sick visit   Patient ID: Kerry [name], female    DOB: [birth year], 61 y.o.   MRN: [id number]  HPI  This is a 61 year old female who is chronically trach dependent in the setting of vocal cord paralysis after prolonged intubation after acute CVA. I have been following her for about a year now. Her son called the clinic reporting she has had increased cough and associated chest tightness w/ cough for about 2 weeks now. They first thought that this may be allergy related as she has had chronic issues w/ sinus drainage and treated these symptoms w/ nasal steroids and mucinex (was seen by PCP who recommended this).  In spite of the nasal steroids and mucinex the symptoms persisted so I asked them to come in so I could assess her.    Review of Systems  Constitutional: Negative for activity change, appetite change, chills, diaphoresis, fatigue and fever.  HENT: Positive for congestion, postnasal drip, rhinorrhea and sinus pressure. Negative for voice change.   Eyes: Negative.   Respiratory: Positive for cough. Negative for apnea and chest tightness.   Cardiovascular: Negative.   Gastrointestinal: Negative.   Endocrine: Negative.   Genitourinary: Negative.   Musculoskeletal: Negative.   Allergic/Immunologic: Positive for environmental allergies.  Neurological: Negative.   Hematological: Negative.   Psychiatric/Behavioral: Negative.        Objective:   Physical Exam  Constitutional: She is oriented to person, place, and time. Vital signs are normal. She appears well-nourished. She is cooperative.  Non-toxic appearance. She does not have a sickly appearance. She does not appear ill. No distress.  HENT:  Head: Normocephalic.  Neck:  The #4 cuffless trach is unremarkable.  The sputum is gray tinged but thin   Cardiovascular: Normal rate, regular rhythm and normal heart sounds.   Pulmonary/Chest: Effort normal and breath sounds normal. She has no decreased breath sounds.   Abdominal: Soft. Normal appearance. There is no tenderness.  Musculoskeletal:  Baseline right sided weakness  Neurological: She is alert and oriented to person, place, and time. GCS eye subscore is 4. GCS verbal subscore is 5. GCS motor subscore is 6.  Skin: Skin is warm, dry and intact. She is not diaphoretic.  Psychiatric: Her mood appears not anxious.    Trach change  Trach Brand: Shiley Size: 6 Style: Uncuffed Secured by: Velcro     Assessment & Plan:  Tracheostomy dependence  H/o vocal cord paralysis  Allergic rhinitis Sinusitis  R/o bronchitis  Discussion  I am not convinced that Charlei is infected. She has chronic nasal discharge and allergies. She is at risk for infections so need to rule this out but given her risk for long term antibiotic exposure I will go ahead and culture the sputum first.   Plan Sputum culture Add atrovent nasal tid Will contact her son Sunday or Monday depending of sputum results. I also asked her son to let me know if she has fever or cough gets worse.   Erick Colace ACNP-BC [place] Pager # [phone] OR # [phone] if no answer

## 2017-02-21 LAB — CULTURE, RESPIRATORY W GRAM STAIN: Culture: NORMAL

## 2017-02-21 LAB — CULTURE, RESPIRATORY

## 2017-04-30 ENCOUNTER — Telehealth: Payer: Self-pay | Admitting: Acute Care

## 2017-05-20 ENCOUNTER — Ambulatory Visit (HOSPITAL_COMMUNITY): Payer: BLUE CROSS/BLUE SHIELD

## 2017-05-28 ENCOUNTER — Ambulatory Visit (HOSPITAL_COMMUNITY)
Admission: RE | Admit: 2017-05-28 | Discharge: 2017-05-28 | Disposition: A | Discharge status: Home / Self Care | Payer: BLUE CROSS/BLUE SHIELD | Source: Ambulatory Visit | Attending: Acute Care | Admitting: Acute Care

## 2017-05-28 DIAGNOSIS — Z4682 Encounter for fitting and adjustment of non-vascular catheter: Secondary | ICD-10-CM | POA: Insufficient documentation

## 2017-05-28 DIAGNOSIS — Z930 Tracheostomy status: Secondary | ICD-10-CM | POA: Diagnosis not present

## 2017-05-28 DIAGNOSIS — J3800 Paralysis of vocal cords and larynx, unspecified: Secondary | ICD-10-CM | POA: Diagnosis not present

## 2017-05-28 DIAGNOSIS — Z8673 Personal history of transient ischemic attack (TIA), and cerebral infarction without residual deficits: Secondary | ICD-10-CM | POA: Insufficient documentation

## 2017-05-28 NOTE — Progress Notes (Signed)
Tracheostomy Procedure Note  Kerry [name] [id number] 1956/01/14  Pre Procedure Tracheostomy Information  Trach Brand: Shiley Size: 6.0 Style: Uncuffed Secured by: Velcro   Procedure: trach change after lidocaine neb    Post Procedure Tracheostomy Information  Trach Brand: Shiley Size: 4.0 Style: Uncuffed Secured by: Velcro   Post Procedure Evaluation:  ETCO2 positive color change from yellow to purple : Yes.   Vital signs:blood pressure 131/97, pulse 75, respirations 12 and pulse oximetry 95 % Patients current condition: stable Complications: No apparent complications Trach site exam: clean, dry Wound care done: 4 x 4 gauze Patient did tolerate procedure well.   Education: PMV given and encouraged to use  Prescription needs: none    Additional needs: 12 week follow up

## 2017-05-28 NOTE — Progress Notes (Addendum)
   Name: Kerry [name] MRN: [id number] DOB: [birth year]    DATE:  05/28/2017   CHIEF COMPLAINT: Tracheostomy change  BRIEF PATIENT DESCRIPTION:  61 year old female well-known to me followed for her chronic tracheostomy following CVA complicated by vocal cord paralysis.  She is tracheostomy dependent for over a year now.  She is cared for by her family.  Presents today for routine tracheostomy change.  SIGNIFICANT EVENTS  None  STUDIES:  None  REVIEW OF SYSTEMS:   Constitutional: Negative for fever, chills, weight loss, malaise/fatigue and diaphoresis.  HENT: Negative for hearing loss, ear pain, nosebleeds, congestion, sore throat, neck pain, tinnitus and ear discharge.   Eyes: Negative for blurred vision, double vision, photophobia, pain, discharge and redness.  Respiratory: Negative for cough, hemoptysis, sputum production, shortness of breath, wheezing and stridor.   Cardiovascular: Negative for chest pain, palpitations, orthopnea, claudication, leg swelling and PND.  Gastrointestinal: Negative for heartburn, nausea, vomiting, abdominal pain, diarrhea, constipation, blood in stool and melena.  Genitourinary: Negative for dysuria, urgency, frequency, hematuria and flank pain.  Musculoskeletal: Negative for myalgias, back pain, joint pain and falls.  Skin: Negative for itching and rash.  Neurological: Negative for dizziness, tingling, tremors, sensory change, speech change, focal weakness, seizures, loss of consciousness, weakness and headaches.  Endo/Heme/Allergies: Negative for environmental allergies and polydipsia. Does not bruise/bleed easily.  SUBJECTIVE:  No acute problems VITAL SIGNS: Heart rate 75 blood pressure 131/97 saturations 95% PHYSICAL EXAMINATION: General: Pleasant 61 year old female resting comfortably in wheelchair currently no acute distress. Neuro: Awake, oriented, has chronic right-sided hemiparesis. HEENT: Normocephalic atraumatic has a #6 cuffless  tracheostomy in place on arrival.  Trach stoma unremarkable Cardiovascular: Regular rate and rhythm Lungs: Clear to auscultation without accessory muscle use Abdomen: Soft nontender no organomegaly Musculoskeletal: Right-sided weakness as noted above Skin: Warm and dry  Procedure  Tracheostomy changed from her 6 tube size #4 Shiley without difficulty   ASSESSMENT / PLAN: Tracheostomy dependence Vocal cord paralysis  Discussion Today I changed the tracheostomy to a size #4.  She is now tolerating Passy-Muir valve much better.  Plan Instructed family to continue Passy-Muir valve as much as possible however take this off at at bedtime Return office visit 3 months Continue routine tracheostomy care.  40    Minutes dedicated to this visit given trach [place] ACNP-BC [place] Pager # [phone] OR # [phone] if no answer  05/28/2017, 2:57 PM

## 2017-07-02 ENCOUNTER — Other Ambulatory Visit: Payer: Self-pay | Admitting: Physician Assistant

## 2017-07-02 DIAGNOSIS — Z1231 Encounter for screening mammogram for malignant neoplasm of breast: Secondary | ICD-10-CM

## 2017-09-09 ENCOUNTER — Other Ambulatory Visit: Payer: Self-pay | Admitting: Acute Care

## 2017-09-09 ENCOUNTER — Inpatient Hospital Stay (HOSPITAL_COMMUNITY): Admission: RE | Admit: 2017-09-09 | Payer: BLUE CROSS/BLUE SHIELD | Source: Ambulatory Visit

## 2017-09-09 MED ORDER — AMOXICILLIN-POT CLAVULANATE 875-125 MG PO TABS: 1.0000 | ORAL_TABLET | Freq: Two times a day (BID) | ORAL | 0 refills | Status: AC

## 2017-09-09 NOTE — Progress Notes (Unsigned)
Spoke to son On-going thick foul smelling tracheal secretions Rhonchus breath sounds  Discolored trach secretions w/ increased difficulty suctioning.  Intermittent fever.  Was supposed to come to trach clinic today BUT car broke down.  Plan -empiric augmentin x 7d -son will touch base w/ me next week if not better -we will need to reschedule trach change anyhow so if sputum persists we can get sputum culture at that point.   Erick Colace ACNP-BC [place] Pager # [phone] OR # [phone] if no answer

## 2017-09-18 ENCOUNTER — Encounter (HOSPITAL_COMMUNITY): Payer: Self-pay | Admitting: Emergency Medicine

## 2017-09-18 ENCOUNTER — Emergency Department (HOSPITAL_COMMUNITY): Payer: BLUE CROSS/BLUE SHIELD

## 2017-09-18 ENCOUNTER — Emergency Department (HOSPITAL_COMMUNITY)
Admission: EM | Admit: 2017-09-18 | Discharge: 2017-09-19 | Disposition: A | Discharge status: Home / Self Care | Payer: BLUE CROSS/BLUE SHIELD | Attending: Emergency Medicine | Admitting: Emergency Medicine

## 2017-09-18 ENCOUNTER — Other Ambulatory Visit: Payer: Self-pay

## 2017-09-18 DIAGNOSIS — Z8673 Personal history of transient ischemic attack (TIA), and cerebral infarction without residual deficits: Secondary | ICD-10-CM | POA: Insufficient documentation

## 2017-09-18 DIAGNOSIS — I2510 Atherosclerotic heart disease of native coronary artery without angina pectoris: Secondary | ICD-10-CM | POA: Insufficient documentation

## 2017-09-18 DIAGNOSIS — E119 Type 2 diabetes mellitus without complications: Secondary | ICD-10-CM | POA: Diagnosis not present

## 2017-09-18 DIAGNOSIS — J9509 Other tracheostomy complication: Secondary | ICD-10-CM

## 2017-09-18 DIAGNOSIS — J9501 Hemorrhage from tracheostomy stoma: Secondary | ICD-10-CM

## 2017-09-18 DIAGNOSIS — Z79899 Other long term (current) drug therapy: Secondary | ICD-10-CM | POA: Insufficient documentation

## 2017-09-18 DIAGNOSIS — I10 Essential (primary) hypertension: Secondary | ICD-10-CM | POA: Diagnosis not present

## 2017-09-18 DIAGNOSIS — R0602 Shortness of breath: Secondary | ICD-10-CM | POA: Diagnosis not present

## 2017-09-18 DIAGNOSIS — Z853 Personal history of malignant neoplasm of breast: Secondary | ICD-10-CM | POA: Insufficient documentation

## 2017-09-18 DIAGNOSIS — R1084 Generalized abdominal pain: Secondary | ICD-10-CM | POA: Diagnosis not present

## 2017-09-18 NOTE — ED Notes (Signed)
Purewick placed on pt at this time.  °

## 2017-09-18 NOTE — ED Provider Notes (Signed)
Emergency Department Provider Note   I have reviewed the triage vital signs and the nursing notes.   HISTORY  Chief Complaint Shortness of Breath   HPI Kerry [name] is a 62 y.o. female with PMH of CVA, DM, HTN, CVA with chronic trach dependence to the emergency department for evaluation of dislodged trach.  The patient has had a Yoshito Gaza time trach in place which became dislodged while turning the patient this evening.  She is not ventilator dependent.  Family attempted to replace the trach on scene but she became less responsive and cyanotic.  EMS were able to give O2 and stabilize the patient in route. Last trach was 6.0 uncuffed shiley.   Level 5 caveat: CVA and acute respiratory distress.   Past Medical History:  Diagnosis Date  . Breast cancer ([place]) 03/10/2007   Left breat  . Carpal tunnel syndrome on right   . Coronary artery disease   . Degenerative disc disease, cervical   . Degenerative disc disease, lumbar   . Diabetes mellitus   . Elevated troponin   . Fibromyalgia   . GIB (gastrointestinal bleeding)   . HTN (hypertension) 06/13/2015  . Hyperlipidemia   . Hypertension   . PE (pulmonary embolism)   . 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  . Stroke [facility]) 2009   paralyzed on right side  . UTI (lower urinary tract infection)     Patient Active Problem List   Diagnosis Date Noted  . Other chronic sinusitis   . Chronic rhinitis   . Allergic rhinitis   . S/P percutaneous endoscopic gastrostomy (PEG) tube placement ([place]) 08/14/2016  . Seizure-like activity ([place]) 06/29/2016  . History of stroke 06/29/2016  . Chronic daily headache 06/29/2016  . Cough   . Purulent bronchitis ([place])   . Tracheostomy status ([place])   . Tracheostomy dependence ([place])   . Acute bronchitis   . Tracheal stenosis   . Hemoptysis   . Pressure ulcer 07/04/2015  . Aspiration into airway   . Acute encephalopathy   . Infective urethritis    . Vocal cord paresis   . Cerebrovascular accident (CVA) due to embolism of right middle cerebral artery ([place])   . Left pontine stroke ([place])   . HCAP (healthcare-associated pneumonia)   . Encounter for orogastric (OG) tube placement   . Endotracheally intubated   . Acute respiratory failure with hypoxia and hypercarbia ([place]) 06/19/2015  . Altered mental state   . Breathing difficulty   . Leukocytosis   . Tachycardia   . HTN (hypertension) 06/13/2015  . Mental status change   . PE (pulmonary embolism) 06/07/2015  . Elevated troponin 06/07/2015  . UTI (urinary tract infection) 06/07/2015  . Coffee ground emesis 06/07/2015  . Sepsis ([place]) 06/07/2015  . Pulmonary embolism without acute cor pulmonale ([place])   . Arterial hypotension   . Hypoxemia   . Stridor   . Sinus tachycardia 06/06/2015  . Vomiting blood   . Stroke due to thrombosis of left middle cerebral artery ([place]) 05/16/2015  . Hemiparesis, aphasia, and dysphagia as late effect of cerebrovascular accident (CVA) ([place])   . Essential hypertension   . Coronary artery disease involving native coronary artery of native heart with angina pectoris ([place])   . History of breast cancer   . Fibromyalgia   . Chronic pain syndrome   . Generalized anxiety disorder   . Prerenal azotemia   . Pneumonia   .  Chronic respiratory failure ([place])   . Respiratory failure ([place])   . Hypokalemia   . Compromised airway   . HLD (hyperlipidemia)   . Type 2 diabetes mellitus with circulatory disorder ([place])   . Sinus bradycardia   . Asystole ([place])   . Acute respiratory failure ([place])   . Cerebral hemorrhage ([place])   . Cytotoxic cerebral edema ([place])   . Sinus pause   . Cerebrovascular accident (CVA) due to occlusion of left middle cerebral artery ([place])   . Cerebrovascular accident (CVA) due to thrombosis of precerebral artery ([place])   . Encounter for central line placement   . Encounter for feeding tube placement   . CVA (cerebral infarction)  04/11/2015  . Stroke (cerebrum) ([place]) 04/11/2015  . Aphasia   . Flaccid hemiplegia and hemiparesis   . Hypertensive urgency   . Uncontrolled type 2 diabetes mellitus with hyperosmolarity without coma, without [place]-term current use of insulin ([place])   . Breast cancer ([place]) 11/08/2012    Past Surgical History:  Procedure Laterality Date  . APPENDECTOMY    . BREAST SURGERY    . CARDIAC CATHETERIZATION  2005  . CARDIAC CATHETERIZATION N/A 04/13/2015   Procedure: Temporary Pacemaker;  Surgeon: Peter M Martinique, MD;  Location: [facility];  Service: Cardiovascular;  Laterality: N/A;  . CARDIAC SURGERY    . ESOPHAGOGASTRODUODENOSCOPY N/A 04/18/2015   Procedure: ESOPHAGOGASTRODUODENOSCOPY (EGD);  Surgeon: Judeth Horn, MD;  Location: [facility];  Service: General;  Laterality: N/A;  . IR GASTROSTOMY TUBE REMOVAL  11/05/2016  . MASTECTOMY Left 03/2007  . PEG PLACEMENT N/A 04/18/2015   Procedure: PERCUTANEOUS ENDOSCOPIC GASTROSTOMY (PEG) PLACEMENT;  Surgeon: Judeth Horn, MD;  Location: [facility];  Service: General;  Laterality: N/A;  bedside  . RADIOLOGY WITH ANESTHESIA N/A 04/11/2015   Procedure: RADIOLOGY WITH ANESTHESIA;  Surgeon: Luanne Bras, MD;  Location: [facility];  Service: Radiology;  Laterality: N/A;  . TUBAL LIGATION Right   . VEIN SURGERY Right 2005    Current Outpatient Rx  . Order #: [id number] Class: Normal  . Order #: [id number] Class: Normal  . Order #: [id number] Class: Normal  . Order #: [id number] Class: Historical Med  . Order #: [id number] Class: Historical Med  . Order #: [id number] Class: Normal  . Order #: [id number] Class: Normal  . Order #: [id number] Class: Normal  . Order #: [id number] Class: Normal  . Order #: [id number] Class: Normal  . Order #: [id number] Class: Print  . Order #: [id number] Class: Historical Med    Allergies Codeine; Oxycodone; and Oxycontin [oxycodone hcl]  Family History  Problem Relation Age of Onset  . Cancer Mother        Breast cancer  (right)  . Cancer Maternal Aunt        Breast cancer  . Cancer Maternal Aunt        Breast cancer  . Cancer Cousin        Breast Cancer  . Diabetes Sister   . Colon cancer Neg Hx     Social History Social History   Tobacco Use  . Smoking status: Former Smoker    Packs/day: 0.25    Last attempt to quit: 08/14/2000    Years since quitting: 17.1  . Smokeless tobacco: Never Used  Substance Use Topics  . Alcohol use: Yes    Comment: Social drinker  . Drug use: No    Comment: Tried crack cocaine and marijuana in the past    Review of Systems  Level 5 caveat: Acute distress.   ____________________________________________  PHYSICAL EXAM:  VITAL SIGNS: ED Triage Vitals [09/18/17 2250]  Enc Vitals Group     BP 135/77     Pulse Rate 89     Resp (!) 21     Temp 98.4 F (36.9 C)     Temp Source Oral     SpO2 (!) 86 %   Constitutional: Alert with small amount of blood coming from tracheostomy.  Eyes: Conjunctivae are normal.  Head: Atraumatic. Nose: No congestion/rhinnorhea. Mouth/Throat: Mucous membranes are moist.  Oropharynx non-erythematous. Neck: No stridor. Very small trach stoma with trace bleeding.  Cardiovascular: Normal rate, regular rhythm. Good peripheral circulation. Grossly normal heart sounds.   Respiratory: Increased respiratory effort.  No retractions. Lungs CTAB. Gastrointestinal: Soft and nontender. Positive mild distention.  Musculoskeletal: No lower extremity tenderness nor edema. No gross deformities of extremities. Neurologic: Dense right hemiplegia (baseline).  Skin:  Skin is warm, dry and intact. No rash noted.  ____________________________________________   LABS (all labs ordered are listed, but only abnormal results are displayed)  Labs Reviewed  CBC WITH DIFFERENTIAL/PLATELET - Abnormal; Notable for the following components:      Result Value   WBC 15.0 (*)    Neutro Abs 12.3 (*)    All other components within normal limits    COMPREHENSIVE METABOLIC PANEL  BRAIN NATRIURETIC PEPTIDE   ____________________________________________  RADIOLOGY  Dg Chest Portable 1 View  Result Date: 09/19/2017 CLINICAL DATA:  62 year old female with exchange of the tracheostomy. EXAM: PORTABLE CHEST 1 VIEW COMPARISON:  Chest radiograph dated 04/12/2016 FINDINGS: There has been interval removal of the tracheostomy with placement of a tracheostomy tube. The tip of the tube extends into the right mainstem bronchus. Recommend retraction by approximately 4.5 cm. The lungs are clear. There is no pleural effusion or pneumothorax. Mild cardiomegaly. No acute osseous pathology. IMPRESSION: The newly placed tracheostomy tube extends into the proximal right mainstem bronchus. Recommend retraction by approximately 4.5 cm. These results were called by telephone at the time of interpretation on 09/19/2017 at 12:16 am to Dr. Nanda Quinton , who verbally acknowledged these results. Electronically Signed   By: Anner Crete M.D.   On: 09/19/2017 00:15   Dg Abd Portable 1 View  Result Date: 09/19/2017 CLINICAL DATA:  Abdominal distension. EXAM: PORTABLE ABDOMEN - 1 VIEW COMPARISON:  None. FINDINGS: The bowel gas pattern is nonobstructive. No radio-opaque calculi or other significant radiographic abnormality are seen. IMPRESSION: Nonobstructive/nonspecific bowel gas pattern. Electronically Signed   By: Fidela Salisbury M.D.   On: 09/19/2017 00:55    ____________________________________________   PROCEDURES  Procedure(s) performed:   .Critical Care Performed by: Margette Fast, MD Authorized by: Margette Fast, MD   Critical care provider statement:    Critical care time (minutes):  45   Critical care time was exclusive of:  Separately billable procedures and treating other patients and teaching time   Critical care was necessary to treat or prevent imminent or life-threatening deterioration of the following conditions:  Respiratory failure    Critical care was time spent personally by me on the following activities:  Blood draw for specimens, discussions with consultants, development of treatment plan with patient or surrogate, evaluation of patient's response to treatment, examination of patient, obtaining history from patient or surrogate, ordering and performing treatments and interventions, ordering and review of laboratory studies, ordering and review of radiographic studies, pulse oximetry and re-evaluation of patient's condition   I assumed direction of critical care for this patient from another provider in  my specialty: no   TRACHEOSTOMY REPLACEMENT Date/Time: 09/19/2017 12:09 AM Performed by: Margette Fast, MD Authorized by: Margette Fast, MD  Consent: The procedure was performed in an emergent situation. Risks and benefits: risks, benefits and alternatives were discussed Consent given by: patient Required items: required blood products, implants, devices, and special equipment available Indications: became dislodged Local anesthesia used: yes Anesthesia: local infiltration  Anesthesia: Local anesthesia used: yes Local Anesthetic: lidocaine 1% with epinephrine Anesthetic total: 5 mL  Sedation: Patient sedated: no  Tube type: 6.0 ETT. Tube size: 6.0 mm Cuff inflation: inflated Cuff type: air Seldinger technique: Seldinger technique used Complications: bleeding and dyspnea Patient tolerance: Patient tolerated the procedure well with no immediate complications Comments: Attempted to pass a 4.0 cuffed trach without success over the bougie. Was ultimately able to place a 6.0 ETT. Attempted to dilate track with 6.5 ETT over bougie without success. Patient began having more bleeding and dilation procedure was aborted. 6.0 ETT tube was replaced and inflated. Patient placed on trac collar with 100% O2 Sat.     ____________________________________________   INITIAL IMPRESSION / ASSESSMENT AND PLAN / ED  COURSE  Pertinent labs & imaging results that were available during my care of the patient were reviewed by me and considered in my medical decision making (see chart for details).  She presents to the emergency department for evaluation after her trach became dislodged. Home trach is 6.0 uncuffed shiley and is follow by trach clinic at [facility].  See procedure note above.  Patient ultimately had to have an endotracheal tube placed through the tracheostomy stoma.  The patient was never in distress to the point where she required orotracheal intubation.  I was not successful with my attempt at dilating the tracheostomy in order to accommodate the larger size trach at our facility.  We do not have smaller size trach at this facility.  Plan for chest x-ray.  The patient did have some vomiting in route with EMS.  Family arrived and do not have any of the home trachs with them for attempted replacement.  They also note that her abdomen and right leg have been more distended over the past 2 weeks.  She continues to have bowel movements. Will send labs and abdominal x-ray now that the patient is more stable from an airway perspective.   11:50 PM Spoke with Dr. Janace Hoard regarding the case. I am unable to dilate up here in the [facility] ED without success. He requests an ED-ED transfer for evaluation and possible trach exchange and dicharge home pending results of procedure. Called and spoke with Deno Etienne who accepts the patient in ED-ED transfer to [facility]. Will call [facility] for transportation. Discussed plan with family.  Abdominal x-ray reviewed with no acute findings. CBC reviewed with mild leukocytosis with no source for infection. Suspect elevated in response to acute respiratory distress. Patient continues to be comfortable with 100% O2 Sat.  ____________________________________________  FINAL CLINICAL IMPRESSION(S) / ED DIAGNOSES  Final diagnoses:  Hemorrhage from tracheostomy stoma [facility])  Other tracheostomy  complication ([place])    Note:  This document was prepared using Dragon voice recognition software and may include unintentional dictation errors.  Nanda Quinton, MD Emergency Medicine    Norwin Aleman, [facility], MD 09/19/17 [id number]

## 2017-09-19 ENCOUNTER — Emergency Department (HOSPITAL_COMMUNITY): Payer: BLUE CROSS/BLUE SHIELD

## 2017-09-19 DIAGNOSIS — I2510 Atherosclerotic heart disease of native coronary artery without angina pectoris: Secondary | ICD-10-CM | POA: Diagnosis not present

## 2017-09-19 DIAGNOSIS — J9501 Hemorrhage from tracheostomy stoma: Secondary | ICD-10-CM | POA: Diagnosis not present

## 2017-09-19 DIAGNOSIS — Z79899 Other long term (current) drug therapy: Secondary | ICD-10-CM | POA: Diagnosis not present

## 2017-09-19 DIAGNOSIS — R0602 Shortness of breath: Secondary | ICD-10-CM | POA: Diagnosis not present

## 2017-09-19 DIAGNOSIS — R1084 Generalized abdominal pain: Secondary | ICD-10-CM | POA: Diagnosis not present

## 2017-09-19 DIAGNOSIS — Z853 Personal history of malignant neoplasm of breast: Secondary | ICD-10-CM | POA: Diagnosis not present

## 2017-09-19 DIAGNOSIS — Z8673 Personal history of transient ischemic attack (TIA), and cerebral infarction without residual deficits: Secondary | ICD-10-CM | POA: Diagnosis not present

## 2017-09-19 DIAGNOSIS — E119 Type 2 diabetes mellitus without complications: Secondary | ICD-10-CM | POA: Diagnosis not present

## 2017-09-19 DIAGNOSIS — I10 Essential (primary) hypertension: Secondary | ICD-10-CM | POA: Diagnosis not present

## 2017-09-19 DIAGNOSIS — J9509 Other tracheostomy complication: Secondary | ICD-10-CM | POA: Diagnosis present

## 2017-09-19 LAB — CBC WITH DIFFERENTIAL/PLATELET
Basophils Absolute: 0 10*3/uL (ref 0.0–0.1)
Basophils Relative: 0 %
EOS ABS: 0.1 10*3/uL (ref 0.0–0.7)
EOS PCT: 1 %
HCT: 39.4 % (ref 36.0–46.0)
Hemoglobin: 12.5 g/dL (ref 12.0–15.0)
LYMPHS ABS: 2 10*3/uL (ref 0.7–4.0)
Lymphocytes Relative: 13 %
MCH: 27.4 pg (ref 26.0–34.0)
MCHC: 31.7 g/dL (ref 30.0–36.0)
MCV: 86.2 fL (ref 78.0–100.0)
MONOS PCT: 4 %
Monocytes Absolute: 0.6 10*3/uL (ref 0.1–1.0)
Neutro Abs: 12.3 10*3/uL — ABNORMAL HIGH (ref 1.7–7.7)
Neutrophils Relative %: 82 %
PLATELETS: 223 10*3/uL (ref 150–400)
RBC: 4.57 MIL/uL (ref 3.87–5.11)
RDW: 14.2 % (ref 11.5–15.5)
WBC: 15 10*3/uL — AB (ref 4.0–10.5)

## 2017-09-19 LAB — COMPREHENSIVE METABOLIC PANEL
ALK PHOS: 92 U/L (ref 38–126)
ALT: 18 U/L (ref 14–54)
AST: 13 U/L — ABNORMAL LOW (ref 15–41)
Albumin: 3.2 g/dL — ABNORMAL LOW (ref 3.5–5.0)
Anion gap: 12 (ref 5–15)
BUN: 18 mg/dL (ref 6–20)
CALCIUM: 8.7 mg/dL — AB (ref 8.9–10.3)
CO2: 24 mmol/L (ref 22–32)
CREATININE: 0.49 mg/dL (ref 0.44–1.00)
Chloride: 104 mmol/L (ref 101–111)
GFR calc non Af Amer: >60 mL/min (ref >60)
GLUCOSE: 286 mg/dL — AB (ref 65–99)
Potassium: 4.3 mmol/L (ref 3.5–5.1)
SODIUM: 140 mmol/L (ref 135–145)
Total Bilirubin: 0.5 mg/dL (ref 0.3–1.2)
Total Protein: 7.1 g/dL (ref 6.5–8.1)

## 2017-09-19 LAB — BRAIN NATRIURETIC PEPTIDE: B Natriuretic Peptide: 37 pg/mL (ref 0.0–100.0)

## 2017-09-19 MED ORDER — METRONIDAZOLE 500 MG PO TABS: 500.0000 mg | ORAL_TABLET | Freq: Two times a day (BID) | ORAL | 0 refills | Status: DC

## 2017-09-19 MED ORDER — IOPAMIDOL (ISOVUE-300) INJECTION 61%
INTRAVENOUS | Status: AC
  Administered 2017-09-19: 100 mL
  Filled 2017-09-19: qty 100

## 2017-09-19 NOTE — ED Notes (Signed)
Patient transported to CT 

## 2017-09-19 NOTE — Progress Notes (Signed)
Patient is a chronic trach at home. Patient went to [facility] for dislodged trach and other medical issues. Patient came to [facility] with a #6 ET tube sticking out of the stoma. Emergency room physician asked respiratory to replace trach into stoma. A #4 cuffless shiley was inserted into stoma, positive color change on EZ-Cap, a suction catheter was passed with no problems. Sp02 stayed above 93% during the procedure.

## 2017-09-19 NOTE — ED Notes (Signed)
Report to [facility], ETA 31min

## 2017-09-19 NOTE — Progress Notes (Signed)
Patient was sent to [facility]  ED after her trach accidentally came out and was unable to be placed back by family. [place] physician tried twice to placed an trach( #4 Shiley)back in but was unsuccessful. A #6 ETT was placed using a Bougie to keep airway open to placed ETT. Patient was then placed on  T-bar with 100% FIO2 bled in. Patient was resting comfortably until [facility] came. She was suctioned 4X with in moderate amounts of blood and yellow secretions. RTT sent ambu bag and trach with her. Also sent her medications and informed ED nurse they were sent with patient. Patient ETT was just above the carina , but physician didn't pull back due to trach being positional. Patient was placed of T-bar set- up for travel with 15 lpm bled in to maintain saturations above 95%.

## 2017-09-19 NOTE — ED Provider Notes (Signed)
Patient is a 62 year old female transferred from [facility] for evaluation of tracheostomy dislodgment.  Her tracheostomy apparently fell out when the family was turning her this evening.  They did not have the correct size of trach at [facility], so an ET tube was placed in the stoma and the patient sent here.  A new trach was placed by respiratory and the patient tolerated this procedure well.  The patient's husband is reporting that she has been having increased abdominal distention and discomfort over the past several days and is concerned about this as well.  On exam, she does exhibit some tenderness to the left lateral abdomen.  A CT scan was obtained which reveals no acute intra-abdominal pathology.  It is consistent with a diarrheal state.  She will be treated with Flagyl and discharged to home.   Veryl Speak, MD 09/19/17 0600

## 2017-09-19 NOTE — Discharge Instructions (Addendum)
Flagyl as prescribed.  Return to the Emergency Department for worsening abdominal pain, high fevers, bloody stools, or other new and concerning symptoms.

## 2017-09-19 NOTE — ED Notes (Signed)
Pt verbalizes understanding of d/c instructions. Pt received prescriptions. Pt waiting for PTAR.

## 2017-09-19 NOTE — ED Notes (Signed)
Report to Raquel Sarna, charge @ [facility] ED

## 2018-04-12 ENCOUNTER — Ambulatory Visit: Payer: BLUE CROSS/BLUE SHIELD | Admitting: Gastroenterology

## 2018-04-22 ENCOUNTER — Ambulatory Visit (HOSPITAL_COMMUNITY): Payer: BLUE CROSS/BLUE SHIELD

## 2018-04-29 ENCOUNTER — Ambulatory Visit (HOSPITAL_COMMUNITY): Payer: BLUE CROSS/BLUE SHIELD

## 2018-05-13 ENCOUNTER — Inpatient Hospital Stay (HOSPITAL_COMMUNITY): Admission: RE | Admit: 2018-05-13 | Payer: BLUE CROSS/BLUE SHIELD | Source: Ambulatory Visit

## 2019-01-07 ENCOUNTER — Other Ambulatory Visit: Payer: Self-pay | Admitting: Acute Care

## 2019-01-07 DIAGNOSIS — Z930 Tracheostomy status: Secondary | ICD-10-CM

## 2019-01-10 ENCOUNTER — Other Ambulatory Visit (HOSPITAL_COMMUNITY)
Admission: RE | Admit: 2019-01-10 | Discharge: 2019-01-10 | Disposition: A | Discharge status: Home / Self Care | Payer: BC Managed Care – PPO | Source: Ambulatory Visit | Attending: Acute Care | Admitting: Acute Care

## 2019-01-10 DIAGNOSIS — Z1159 Encounter for screening for other viral diseases: Secondary | ICD-10-CM | POA: Insufficient documentation

## 2019-01-10 LAB — SARS CORONAVIRUS 2 (TAT 6-24 HRS): SARS Coronavirus 2: NEGATIVE

## 2019-01-13 ENCOUNTER — Ambulatory Visit (HOSPITAL_COMMUNITY)
Admission: RE | Admit: 2019-01-13 | Discharge: 2019-01-13 | Disposition: A | Discharge status: Home / Self Care | Payer: BC Managed Care – PPO | Source: Ambulatory Visit | Attending: Acute Care | Admitting: Acute Care

## 2019-01-13 ENCOUNTER — Telehealth: Payer: Self-pay | Admitting: Acute Care

## 2019-01-13 ENCOUNTER — Other Ambulatory Visit: Payer: Self-pay

## 2019-01-13 DIAGNOSIS — E669 Obesity, unspecified: Secondary | ICD-10-CM | POA: Insufficient documentation

## 2019-01-13 DIAGNOSIS — Z430 Encounter for attention to tracheostomy: Secondary | ICD-10-CM | POA: Insufficient documentation

## 2019-01-13 DIAGNOSIS — Z930 Tracheostomy status: Secondary | ICD-10-CM | POA: Diagnosis not present

## 2019-01-13 DIAGNOSIS — J3800 Paralysis of vocal cords and larynx, unspecified: Secondary | ICD-10-CM | POA: Insufficient documentation

## 2019-01-13 DIAGNOSIS — I69398 Other sequelae of cerebral infarction: Secondary | ICD-10-CM | POA: Diagnosis not present

## 2019-01-13 DIAGNOSIS — J209 Acute bronchitis, unspecified: Secondary | ICD-10-CM | POA: Diagnosis not present

## 2019-01-13 MED ORDER — CEFDINIR 300 MG PO CAPS: 300.0000 mg | ORAL_CAPSULE | Freq: Two times a day (BID) | ORAL | 0 refills | Status: DC

## 2019-01-13 NOTE — Progress Notes (Signed)
Tracheostomy Procedure Note  Kerry [name] [id number] 1955-10-15  Pre Procedure Tracheostomy Information  Trach Brand: Shiley Size: 4.0 Style: Uncuffed Secured by: Velcro   Procedure: trach cleaning and trach change    Post Procedure Tracheostomy Information  Trach Brand: Shiley Size: 4.0 Style: Uncuffed Secured by: Velcro   Post Procedure Evaluation:  ETCO2 positive color change from yellow to purple : Yes.   Vital signs:blood pressure 147/56  pulse 82 respirations 16 and pulse oximetry 98% on RA Patients current condition: stable Complications: No apparent complications Trach site exam: clean Wound care done: dry Patient did tolerate procedure well.  Vitals post trach change  135/91 RR 16  HR 88  97% on RA  Education:  none None needed  Prescription needs: none   Additional needs: none

## 2019-01-13 NOTE — Progress Notes (Signed)
[facility]   Reason for visit:  Routine trach change  HPI:  Chronic trach dep 63 year old female well known to me. 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  She presents today for routine trach change  Does report cough and productive sputum but no fever.  ROS  Review of Systems - History obtained from child General ROS: negative ENT ROS: negative for - epistaxis, headaches, nasal congestion, sinus pain, sneezing or sore throat Allergy and Immunology ROS: positive for - seasonal allergies Hematological and Lymphatic ROS: negative Endocrine ROS: negative Respiratory ROS: positive for - cough negative for - hemoptysis, orthopnea, pleuritic pain, shortness of breath, sputum changes, stridor, tachypnea or wheezing Cardiovascular ROS: no chest pain or dyspnea on exertion Gastrointestinal ROS: no abdominal pain, change in bowel habits, or black or bloody stools Genito-Urinary ROS: positive for - dysuria Musculoskeletal ROS: negative Neurological ROS: no TIA or stroke symptoms  Vital signs:  Reviewed  Exam:  Physical Exam Constitutional:      General: She is not in acute distress.    Appearance: Normal appearance. She is obese. She is not ill-appearing, toxic-appearing or diaphoretic.  HENT:     Head: Normocephalic and atraumatic.     Nose: Nose normal.     Mouth/Throat:     Mouth: Mucous membranes are moist.  Eyes:     Pupils: Pupils are equal, round, and reactive to light.  Neck:     Musculoskeletal: Normal range of motion.     Comments: Lurline Idol site unremarkable  Cardiovascular:     Rate and Rhythm: Normal rate.     Heart sounds: No murmur. No friction rub. No gallop.   Pulmonary:     Effort: Pulmonary effort is normal.     Breath sounds: Normal breath sounds. No wheezing.  Abdominal:     General: Abdomen is flat. Bowel sounds are normal.     Palpations: Abdomen is soft.  Musculoskeletal: Normal  range of motion.  Skin:    General: Skin is warm and dry.     Capillary Refill: Capillary refill takes less than 2 seconds.  Neurological:     General: No focal deficit present.     Mental Status: She is alert.     Trach change/procedure: trach changed at bedside. Also sent sputum for culture       Impression/dx  Tracheostomy dependence  Vocal cord paralysis  R/o tracheobronchitis.  Obesity  Discussion  Alle is doing well from a trach stand-point. She has had cough and has aspirated in the past. I did obtain a sputum culture and provided her w/ a prescription of omnicef should she develop fever of symptoms worsen I also noticed that Meko has put on significant weight since last visit. I discussed this with her son who tells me he has now taken over her care from her other son and that he will be doing the grocery shopping from here on out. We discussed healthy eating options for her  Plan  ROV 3 months omnicef is symptoms worse  Routine trach care.     Visit time: 32  minutes.   Erick Colace ACNP-BC [place]

## 2019-01-15 LAB — CULTURE, RESPIRATORY W GRAM STAIN

## 2019-02-02 NOTE — Addendum Note (Signed)
Encounter addended by: Erick Colace, NP on: 02/02/2019 3:38 PM  Actions taken: Clinical Note Signed, Problem List modified, Charge Capture section accepted

## 2020-02-02 ENCOUNTER — Other Ambulatory Visit: Payer: Self-pay

## 2020-02-02 ENCOUNTER — Emergency Department (HOSPITAL_COMMUNITY): Payer: 59

## 2020-02-02 ENCOUNTER — Inpatient Hospital Stay (HOSPITAL_COMMUNITY)
Admission: EM | Admit: 2020-02-02 | Discharge: 2020-02-09 | DRG: 854 | Disposition: A | Discharge status: Home / Self Care | Payer: 59 | Attending: Internal Medicine | Admitting: Internal Medicine

## 2020-02-02 ENCOUNTER — Encounter (HOSPITAL_COMMUNITY): Payer: Self-pay

## 2020-02-02 DIAGNOSIS — Z86711 Personal history of pulmonary embolism: Secondary | ICD-10-CM | POA: Diagnosis not present

## 2020-02-02 DIAGNOSIS — Z794 Long term (current) use of insulin: Secondary | ICD-10-CM | POA: Diagnosis not present

## 2020-02-02 DIAGNOSIS — R197 Diarrhea, unspecified: Secondary | ICD-10-CM | POA: Diagnosis not present

## 2020-02-02 DIAGNOSIS — Z86718 Personal history of other venous thrombosis and embolism: Secondary | ICD-10-CM

## 2020-02-02 DIAGNOSIS — Z87891 Personal history of nicotine dependence: Secondary | ICD-10-CM | POA: Diagnosis not present

## 2020-02-02 DIAGNOSIS — R6520 Severe sepsis without septic shock: Secondary | ICD-10-CM | POA: Diagnosis present

## 2020-02-02 DIAGNOSIS — Z7901 Long term (current) use of anticoagulants: Secondary | ICD-10-CM

## 2020-02-02 DIAGNOSIS — E876 Hypokalemia: Secondary | ICD-10-CM | POA: Diagnosis present

## 2020-02-02 DIAGNOSIS — Z853 Personal history of malignant neoplasm of breast: Secondary | ICD-10-CM | POA: Diagnosis not present

## 2020-02-02 DIAGNOSIS — I10 Essential (primary) hypertension: Secondary | ICD-10-CM | POA: Diagnosis present

## 2020-02-02 DIAGNOSIS — Z79899 Other long term (current) drug therapy: Secondary | ICD-10-CM

## 2020-02-02 DIAGNOSIS — Z930 Tracheostomy status: Secondary | ICD-10-CM

## 2020-02-02 DIAGNOSIS — E278 Other specified disorders of adrenal gland: Secondary | ICD-10-CM | POA: Diagnosis present

## 2020-02-02 DIAGNOSIS — Z6835 Body mass index (BMI) 35.0-35.9, adult: Secondary | ICD-10-CM

## 2020-02-02 DIAGNOSIS — Z885 Allergy status to narcotic agent status: Secondary | ICD-10-CM | POA: Diagnosis not present

## 2020-02-02 DIAGNOSIS — A044 Other intestinal Escherichia coli infections: Secondary | ICD-10-CM | POA: Diagnosis present

## 2020-02-02 DIAGNOSIS — R7881 Bacteremia: Secondary | ICD-10-CM | POA: Diagnosis not present

## 2020-02-02 DIAGNOSIS — A419 Sepsis, unspecified organism: Secondary | ICD-10-CM

## 2020-02-02 DIAGNOSIS — Z803 Family history of malignant neoplasm of breast: Secondary | ICD-10-CM

## 2020-02-02 DIAGNOSIS — N390 Urinary tract infection, site not specified: Secondary | ICD-10-CM | POA: Diagnosis not present

## 2020-02-02 DIAGNOSIS — A4159 Other Gram-negative sepsis: Secondary | ICD-10-CM | POA: Diagnosis not present

## 2020-02-02 DIAGNOSIS — Z888 Allergy status to other drugs, medicaments and biological substances status: Secondary | ICD-10-CM

## 2020-02-02 DIAGNOSIS — E871 Hypo-osmolality and hyponatremia: Secondary | ICD-10-CM | POA: Diagnosis present

## 2020-02-02 DIAGNOSIS — Z20822 Contact with and (suspected) exposure to covid-19: Secondary | ICD-10-CM | POA: Diagnosis present

## 2020-02-02 DIAGNOSIS — E785 Hyperlipidemia, unspecified: Secondary | ICD-10-CM | POA: Diagnosis present

## 2020-02-02 DIAGNOSIS — N136 Pyonephrosis: Secondary | ICD-10-CM | POA: Diagnosis present

## 2020-02-02 DIAGNOSIS — B964 Proteus (mirabilis) (morganii) as the cause of diseases classified elsewhere: Secondary | ICD-10-CM | POA: Diagnosis present

## 2020-02-02 DIAGNOSIS — Z833 Family history of diabetes mellitus: Secondary | ICD-10-CM | POA: Diagnosis not present

## 2020-02-02 DIAGNOSIS — I2510 Atherosclerotic heart disease of native coronary artery without angina pectoris: Secondary | ICD-10-CM | POA: Diagnosis present

## 2020-02-02 DIAGNOSIS — N179 Acute kidney failure, unspecified: Secondary | ICD-10-CM | POA: Diagnosis present

## 2020-02-02 DIAGNOSIS — B961 Klebsiella pneumoniae [K. pneumoniae] as the cause of diseases classified elsewhere: Secondary | ICD-10-CM | POA: Diagnosis present

## 2020-02-02 DIAGNOSIS — N132 Hydronephrosis with renal and ureteral calculous obstruction: Secondary | ICD-10-CM | POA: Diagnosis not present

## 2020-02-02 DIAGNOSIS — M797 Fibromyalgia: Secondary | ICD-10-CM | POA: Diagnosis present

## 2020-02-02 DIAGNOSIS — R140 Abdominal distension (gaseous): Secondary | ICD-10-CM

## 2020-02-02 DIAGNOSIS — E1165 Type 2 diabetes mellitus with hyperglycemia: Secondary | ICD-10-CM | POA: Diagnosis not present

## 2020-02-02 DIAGNOSIS — Z9012 Acquired absence of left breast and nipple: Secondary | ICD-10-CM

## 2020-02-02 DIAGNOSIS — I6930 Unspecified sequelae of cerebral infarction: Secondary | ICD-10-CM

## 2020-02-02 DIAGNOSIS — D6959 Other secondary thrombocytopenia: Secondary | ICD-10-CM | POA: Diagnosis present

## 2020-02-02 DIAGNOSIS — E669 Obesity, unspecified: Secondary | ICD-10-CM | POA: Diagnosis present

## 2020-02-02 LAB — HEMOGLOBIN A1C
Hgb A1c MFr Bld: 8.9 % — ABNORMAL HIGH (ref 4.8–5.6)
Mean Plasma Glucose: 208.73 mg/dL

## 2020-02-02 LAB — URINALYSIS, ROUTINE W REFLEX MICROSCOPIC
Bilirubin Urine: NEGATIVE
Glucose, UA: NEGATIVE mg/dL
Ketones, ur: NEGATIVE mg/dL
Nitrite: NEGATIVE
Protein, ur: >300 mg/dL — AB
Specific Gravity, Urine: 1.02 (ref 1.005–1.030)
pH: 6 (ref 5.0–8.0)

## 2020-02-02 LAB — COMPREHENSIVE METABOLIC PANEL
ALT: 12 U/L (ref 0–44)
AST: 13 U/L — ABNORMAL LOW (ref 15–41)
Albumin: 2.6 g/dL — ABNORMAL LOW (ref 3.5–5.0)
Alkaline Phosphatase: 65 U/L (ref 38–126)
Anion gap: 17 — ABNORMAL HIGH (ref 5–15)
BUN: 57 mg/dL — ABNORMAL HIGH (ref 8–23)
CO2: 20 mmol/L — ABNORMAL LOW (ref 22–32)
Calcium: 7.1 mg/dL — ABNORMAL LOW (ref 8.9–10.3)
Chloride: 96 mmol/L — ABNORMAL LOW (ref 98–111)
Creatinine, Ser: 4.65 mg/dL — ABNORMAL HIGH (ref 0.44–1.00)
GFR calc Af Amer: 11 mL/min — ABNORMAL LOW (ref >60)
GFR calc non Af Amer: 9 mL/min — ABNORMAL LOW (ref >60)
Glucose, Bld: 113 mg/dL — ABNORMAL HIGH (ref 70–99)
Potassium: <2 mmol/L — CL (ref 3.5–5.1)
Sodium: 133 mmol/L — ABNORMAL LOW (ref 135–145)
Total Bilirubin: 0.9 mg/dL (ref 0.3–1.2)
Total Protein: 6.2 g/dL — ABNORMAL LOW (ref 6.5–8.1)

## 2020-02-02 LAB — APTT: aPTT: 36 seconds (ref 24–36)

## 2020-02-02 LAB — CBC WITH DIFFERENTIAL/PLATELET
Abs Immature Granulocytes: 2.08 10*3/uL — ABNORMAL HIGH (ref 0.00–0.07)
Basophils Absolute: 0.1 10*3/uL (ref 0.0–0.1)
Basophils Relative: 0 %
Eosinophils Absolute: 0 10*3/uL (ref 0.0–0.5)
Eosinophils Relative: 0 %
HCT: 36.1 % (ref 36.0–46.0)
Hemoglobin: 11.9 g/dL — ABNORMAL LOW (ref 12.0–15.0)
Immature Granulocytes: 10 %
Lymphocytes Relative: 2 %
Lymphs Abs: 0.5 10*3/uL — ABNORMAL LOW (ref 0.7–4.0)
MCH: 26.9 pg (ref 26.0–34.0)
MCHC: 33 g/dL (ref 30.0–36.0)
MCV: 81.7 fL (ref 80.0–100.0)
Monocytes Absolute: 0.5 10*3/uL (ref 0.1–1.0)
Monocytes Relative: 2 %
Neutro Abs: 17.4 10*3/uL — ABNORMAL HIGH (ref 1.7–7.7)
Neutrophils Relative %: 86 %
Platelets: 94 10*3/uL — ABNORMAL LOW (ref 150–400)
RBC: 4.42 MIL/uL (ref 3.87–5.11)
RDW: 14 % (ref 11.5–15.5)
WBC: 20.5 10*3/uL — ABNORMAL HIGH (ref 4.0–10.5)
nRBC: 0 % (ref 0.0–0.2)

## 2020-02-02 LAB — URINALYSIS, MICROSCOPIC (REFLEX): WBC, UA: >50 WBC/hpf (ref 0–5)

## 2020-02-02 LAB — PROTIME-INR
INR: 1.7 — ABNORMAL HIGH (ref 0.8–1.2)
Prothrombin Time: 19.3 seconds — ABNORMAL HIGH (ref 11.4–15.2)

## 2020-02-02 LAB — CBG MONITORING, ED
Glucose-Capillary: 118 mg/dL — ABNORMAL HIGH (ref 70–99)
Glucose-Capillary: 201 mg/dL — ABNORMAL HIGH (ref 70–99)

## 2020-02-02 LAB — SARS CORONAVIRUS 2 BY RT PCR (HOSPITAL ORDER, PERFORMED IN ~~LOC~~ HOSPITAL LAB): SARS Coronavirus 2: NEGATIVE

## 2020-02-02 LAB — LACTIC ACID, PLASMA
Lactic Acid, Venous: 1.8 mmol/L (ref 0.5–1.9)
Lactic Acid, Venous: 1.9 mmol/L (ref 0.5–1.9)

## 2020-02-02 LAB — HIV ANTIBODY (ROUTINE TESTING W REFLEX): HIV Screen 4th Generation wRfx: NONREACTIVE

## 2020-02-02 MED ORDER — ACETAMINOPHEN 650 MG RE SUPP
650.0000 mg | Freq: Once | RECTAL | Status: AC
  Administered 2020-02-02: 650 mg via RECTAL
  Filled 2020-02-02: qty 1

## 2020-02-02 MED ORDER — TRAMADOL HCL 50 MG PO TABS
50.0000 mg | ORAL_TABLET | Freq: Four times a day (QID) | ORAL | Status: DC | PRN
  Administered 2020-02-07 – 2020-02-08 (×2): 50 mg via ORAL
  Filled 2020-02-02 (×2): qty 1

## 2020-02-02 MED ORDER — ONDANSETRON HCL 4 MG/2ML IJ SOLN
4.0000 mg | Freq: Four times a day (QID) | INTRAMUSCULAR | Status: DC | PRN
  Administered 2020-02-07: 4 mg via INTRAVENOUS
  Filled 2020-02-02: qty 2

## 2020-02-02 MED ORDER — SODIUM CHLORIDE 0.9 % IV SOLN: INTRAVENOUS | Status: DC

## 2020-02-02 MED ORDER — POTASSIUM CHLORIDE 10 MEQ/100ML IV SOLN
10.0000 meq | INTRAVENOUS | Status: AC
  Administered 2020-02-02 (×3): 10 meq via INTRAVENOUS
  Filled 2020-02-02 (×3): qty 100

## 2020-02-02 MED ORDER — LACTATED RINGERS IV BOLUS (SEPSIS)
1000.0000 mL | Freq: Once | INTRAVENOUS | Status: AC
  Administered 2020-02-02: 1000 mL via INTRAVENOUS

## 2020-02-02 MED ORDER — SODIUM CHLORIDE 0.9 % IV SOLN
1.0000 g | INTRAVENOUS | Status: DC
  Administered 2020-02-02 – 2020-02-03 (×2): 1 g via INTRAVENOUS
  Filled 2020-02-02 (×2): qty 10

## 2020-02-02 MED ORDER — FLUTICASONE PROPIONATE 50 MCG/ACT NA SUSP
2.0000 | Freq: Every day | NASAL | Status: DC | PRN
  Filled 2020-02-02: qty 16

## 2020-02-02 MED ORDER — INSULIN ASPART 100 UNIT/ML ~~LOC~~ SOLN
0.0000 [IU] | Freq: Every day | SUBCUTANEOUS | Status: DC
  Administered 2020-02-02 – 2020-02-08 (×3): 2 [IU] via SUBCUTANEOUS
  Filled 2020-02-02: qty 1

## 2020-02-02 MED ORDER — ATORVASTATIN CALCIUM 40 MG PO TABS
40.0000 mg | ORAL_TABLET | Freq: Every day | ORAL | Status: DC
  Administered 2020-02-02 – 2020-02-08 (×6): 40 mg via ORAL
  Filled 2020-02-02 (×6): qty 1

## 2020-02-02 MED ORDER — ACETAMINOPHEN 650 MG RE SUPP: 650.0000 mg | Freq: Four times a day (QID) | RECTAL | Status: DC | PRN

## 2020-02-02 MED ORDER — RIVAROXABAN 10 MG PO TABS
10.0000 mg | ORAL_TABLET | Freq: Every day | ORAL | Status: DC
  Administered 2020-02-02 – 2020-02-03 (×2): 10 mg via ORAL
  Filled 2020-02-02 (×4): qty 1

## 2020-02-02 MED ORDER — INSULIN ASPART 100 UNIT/ML ~~LOC~~ SOLN
0.0000 [IU] | Freq: Three times a day (TID) | SUBCUTANEOUS | Status: DC
  Administered 2020-02-03: 1 [IU] via SUBCUTANEOUS
  Administered 2020-02-03 – 2020-02-05 (×5): 2 [IU] via SUBCUTANEOUS
  Administered 2020-02-05 – 2020-02-06 (×2): 3 [IU] via SUBCUTANEOUS
  Administered 2020-02-06 – 2020-02-07 (×2): 2 [IU] via SUBCUTANEOUS
  Administered 2020-02-07: 7 [IU] via SUBCUTANEOUS
  Administered 2020-02-07: 1 [IU] via SUBCUTANEOUS
  Administered 2020-02-08: 2 [IU] via SUBCUTANEOUS
  Administered 2020-02-08: 3 [IU] via SUBCUTANEOUS
  Administered 2020-02-08 – 2020-02-09 (×2): 5 [IU] via SUBCUTANEOUS
  Administered 2020-02-09: 2 [IU] via SUBCUTANEOUS

## 2020-02-02 MED ORDER — CITALOPRAM HYDROBROMIDE 20 MG PO TABS
20.0000 mg | ORAL_TABLET | Freq: Every day | ORAL | Status: DC
  Administered 2020-02-03 – 2020-02-09 (×7): 20 mg via ORAL
  Filled 2020-02-02 (×9): qty 1

## 2020-02-02 MED ORDER — ONDANSETRON HCL 4 MG PO TABS: 4.0000 mg | ORAL_TABLET | Freq: Four times a day (QID) | ORAL | Status: DC | PRN

## 2020-02-02 MED ORDER — SODIUM CHLORIDE 0.9 % IV BOLUS
1000.0000 mL | Freq: Once | INTRAVENOUS | Status: AC
  Administered 2020-02-02: 1000 mL via INTRAVENOUS

## 2020-02-02 MED ORDER — MAGNESIUM SULFATE 2 GM/50ML IV SOLN
2.0000 g | Freq: Once | INTRAVENOUS | Status: AC
  Administered 2020-02-02: 2 g via INTRAVENOUS
  Filled 2020-02-02: qty 50

## 2020-02-02 MED ORDER — ACETAMINOPHEN 325 MG PO TABS: 650.0000 mg | ORAL_TABLET | Freq: Four times a day (QID) | ORAL | Status: DC | PRN

## 2020-02-02 NOTE — ED Provider Notes (Signed)
[facility] Emergency Department Provider Note MRN:  [id number]  Arrival date & time: 02/02/20     Chief Complaint   Altered Mental Status   History of Present Illness   Kerry [name] is a 64 y.o. year-old female with a history of CAD, stroke presenting to the ED with chief complaint of altered mental status.  Weakness and altered mental status since last night, febrile on arrival as well as hypotensive.  I was unable to obtain an accurate HPI, PMH, or ROS due to the patient's altered mental status.  Level 5 caveat.  Review of Systems  Positive for altered mental status, fever.  Patient's Health History    Past Medical History:  Diagnosis Date  . Breast cancer ([place]) 03/10/2007   Left breat  . Carpal tunnel syndrome on right   . Coronary artery disease   . Degenerative disc disease, cervical   . Degenerative disc disease, lumbar   . Diabetes mellitus   . Elevated troponin   . Fibromyalgia   . GIB (gastrointestinal bleeding)   . HTN (hypertension) 06/13/2015  . Hyperlipidemia   . Hypertension   . PE (pulmonary embolism)   . 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  . Stroke [facility]) 2009   paralyzed on right side  . UTI (lower urinary tract infection)     Past Surgical History:  Procedure Laterality Date  . APPENDECTOMY    . BREAST SURGERY    . CARDIAC CATHETERIZATION  2005  . CARDIAC CATHETERIZATION N/A 04/13/2015   Procedure: Temporary Pacemaker;  Surgeon: Peter M Martinique, MD;  Location: [place] [facility];  Service: Cardiovascular;  Laterality: N/A;  . CARDIAC SURGERY    . ESOPHAGOGASTRODUODENOSCOPY N/A 04/18/2015   Procedure: ESOPHAGOGASTRODUODENOSCOPY (EGD);  Surgeon: Judeth Horn, MD;  Location: [place];  Service: General;  Laterality: N/A;  . IR GASTROSTOMY TUBE REMOVAL  11/05/2016  . MASTECTOMY Left 03/2007  . PEG PLACEMENT N/A 04/18/2015   Procedure: PERCUTANEOUS ENDOSCOPIC  GASTROSTOMY (PEG) PLACEMENT;  Surgeon: Judeth Horn, MD;  Location: [place];  Service: General;  Laterality: N/A;  bedside  . RADIOLOGY WITH ANESTHESIA N/A 04/11/2015   Procedure: RADIOLOGY WITH ANESTHESIA;  Surgeon: Luanne Bras, MD;  Location: [place];  Service: Radiology;  Laterality: N/A;  . TUBAL LIGATION Right   . VEIN SURGERY Right 2005    Family History  Problem Relation Age of Onset  . Cancer Mother        Breast cancer (right)  . Cancer Maternal Aunt        Breast cancer  . Cancer Maternal Aunt        Breast cancer  . Cancer Cousin        Breast Cancer  . Diabetes Sister   . Colon cancer Neg Hx     Social History   Socioeconomic History  . Marital status: Married    Spouse name: Not on file  . Number of children: Not on file  . Years of education: Not on file  . Highest education level: Not on file  Occupational History  . Not on file  Tobacco Use  . Smoking status: Former Smoker    Packs/day: 0.25    Quit date: 08/14/2000    Years since quitting: 19.4  . Smokeless tobacco: Never Used  Vaping Use  . Vaping Use: Never used  Substance and Sexual Activity  . Alcohol use: Yes    Comment: Social  drinker  . Drug use: No    Comment: Tried crack cocaine and marijuana in the past  . Sexual activity: Never    Birth control/protection: None  Other Topics Concern  . Not on file  Social History Narrative  . Not on file   Social Determinants of Health   Financial Resource Strain:   . Difficulty of Paying Living Expenses:   Food Insecurity:   . Worried About Charity fundraiser in the Last Year:   . Arboriculturist in the Last Year:   Transportation Needs:   . Film/video editor (Medical):   Marland Kitchen Lack of Transportation (Non-Medical):   Physical Activity:   . Days of Exercise per Week:   . Minutes of Exercise per Session:   Stress:   . Feeling of Stress :   Social Connections:   . Frequency of Communication with Friends and Family:   . Frequency of  Social Gatherings with Friends and Family:   . Attends Religious Services:   . Active Member of Clubs or Organizations:   . Attends Archivist Meetings:   Marland Kitchen Marital Status:   Intimate Partner Violence:   . Fear of Current or Ex-Partner:   . Emotionally Abused:   Marland Kitchen Physically Abused:   . Sexually Abused:      Physical Exam   Vitals:   02/02/20 1246 02/02/20 1300  BP: (!) 100/46 (!) 97/52  Pulse:    Resp: (!) 28 (!) 28  Temp:    SpO2:      CONSTITUTIONAL: Ill-appearing, NAD NEURO: Right-sided deficits with expressive aphasia (chronic) EYES:  eyes equal and reactive ENT/NECK:  no LAD, no JVD CARDIO: Regular rate, well-perfused, normal S1 and S2 PULM:  CTAB no wheezing or rhonchi GI/GU:  normal bowel sounds, non-distended, non-tender MSK/SPINE:  No gross deformities, no edema SKIN:  no rash, atraumatic PSYCH:  Appropriate speech and behavior  *Additional and/or pertinent findings included in MDM below  Diagnostic and Interventional Summary    EKG Interpretation  Date/Time:  Thursday February 02 2020 11:22:01 EDT Ventricular Rate:  92 PR Interval:  172 QRS Duration: 80 QT Interval:  412 QTC Calculation: 509 R Axis:   23 Text Interpretation: Normal sinus rhythm Nonspecific ST and T wave abnormality Prolonged QT Abnormal ECG Confirmed by Gerlene Fee [phone]) on 02/02/2020 12:07:34 PM      Labs Reviewed  COMPREHENSIVE METABOLIC PANEL - Abnormal; Notable for the following components:      Result Value   Sodium 133 (*)    Potassium <2.0 (*)    Chloride 96 (*)    CO2 20 (*)    Glucose, Bld 113 (*)    BUN 57 (*)    Creatinine, Ser 4.65 (*)    Calcium 7.1 (*)    Total Protein 6.2 (*)    Albumin 2.6 (*)    AST 13 (*)    GFR calc non Af Amer 9 (*)    GFR calc Af Amer 11 (*)    Anion gap 17 (*)    All other components within normal limits  CBC WITH DIFFERENTIAL/PLATELET - Abnormal; Notable for the following components:   WBC 20.5 (*)    Hemoglobin 11.9  (*)    Platelets 94 (*)    Neutro Abs 17.4 (*)    Lymphs Abs 0.5 (*)    Abs Immature Granulocytes 2.08 (*)    All other components within normal limits  PROTIME-INR - Abnormal; Notable for the following  components:   Prothrombin Time 19.3 (*)    INR 1.7 (*)    All other components within normal limits  URINALYSIS, ROUTINE W REFLEX MICROSCOPIC - Abnormal; Notable for the following components:   Color, Urine BROWN (*)    APPearance TURBID (*)    Hgb urine dipstick LARGE (*)    Protein, ur >300 (*)    Leukocytes,Ua LARGE (*)    All other components within normal limits  URINALYSIS, MICROSCOPIC (REFLEX) - Abnormal; Notable for the following components:   Bacteria, UA PRESENT (*)    All other components within normal limits  CULTURE, BLOOD (ROUTINE X 2)  CULTURE, BLOOD (ROUTINE X 2)  SARS CORONAVIRUS 2 BY RT PCR (HOSPITAL ORDER, [place] [facility])  URINE CULTURE  LACTIC ACID, PLASMA  APTT  LACTIC ACID, PLASMA    DG Chest Port 1 View  Final Result      Medications  lactated ringers bolus 1,000 mL (1,000 mLs Intravenous New Bag/Given 02/02/20 1228)    And  lactated ringers bolus 1,000 mL (1,000 mLs Intravenous New Bag/Given 02/02/20 1229)    And  lactated ringers bolus 1,000 mL (has no administration in time range)  cefTRIAXone (ROCEPHIN) 1 g in sodium chloride 0.9 % 100 mL IVPB (1 g Intravenous New Bag/Given 02/02/20 1229)  potassium chloride 10 mEq in 100 mL IVPB (10 mEq Intravenous New Bag/Given 02/02/20 1408)  magnesium sulfate IVPB 2 g 50 mL (2 g Intravenous New Bag/Given 02/02/20 1408)  acetaminophen (TYLENOL) suppository 650 mg (650 mg Rectal Given 02/02/20 1227)     Procedures  /  Critical Care .Critical Care Performed by: Maudie Flakes, MD Authorized by: Maudie Flakes, MD   Critical care provider statement:    Critical care time (minutes):  45   Critical care was necessary to treat or prevent imminent or life-threatening deterioration of the  following conditions:  Sepsis   Critical care was time spent personally by me on the following activities:  Discussions with consultants, evaluation of patient's response to treatment, examination of patient, ordering and performing treatments and interventions, ordering and review of laboratory studies, ordering and review of radiographic studies, pulse oximetry, re-evaluation of patient's condition, obtaining history from patient or surrogate and review of old charts    ED Course and Medical Decision Making  I have reviewed the triage vital signs, the nursing notes, and pertinent available records from the EMR.  Listed above are laboratory and imaging tests that I personally ordered, reviewed, and interpreted and then considered in my medical decision making (see below for details).  Concern for septic shock given fever, hypotension, urine with obvious infection upon in and out catheterization.  Providing 30 cc/kg bolus, empiric antibiotics, will monitor closely for response to fluids.     Patient has responded well to fluids, blood pressure 100/65, clinically appears improved.  Labs reveal critically low potassium, marked leukocytosis, urinalysis with obvious infection.  Will admit to hospitalist service, possibly stepdown unit.  Barth Kirks. [name], [place] [email or web address]  Final Clinical Impressions(s) / ED Diagnoses     ICD-10-CM   1. Sepsis, due to unspecified organism, unspecified whether acute organ dysfunction present ([place])  A41.9   2. Sepsis ([place])  A41.9 DG Chest Port 1 View    DG Chest Port 1 View    ED Discharge Orders    None       Discharge Instructions Discussed with and Provided to Patient:  Discharge Instructions   None       Maudie Flakes, MD 02/02/20 [phone]

## 2020-02-02 NOTE — ED Notes (Signed)
Date and time results received: 02/02/20 1246 (use smartphrase ".now" to insert current time)  Test: Potassium Critical Value: less than 2.0  Name of Provider Notified: Dr [name] Orders Received? Or Actions Taken?: NA

## 2020-02-02 NOTE — ED Triage Notes (Signed)
Pt arrived to ED via [facility] for AMS. Pt with weakness since last night. LKW 8/11 @ 2200. CBG 134

## 2020-02-02 NOTE — H&P (Signed)
History and Physical    [name] KNL:[id number] DOB: 06-27-1955 DOA: 02/02/2020  PCP: Blair Heys, PA-C   Patient coming from: Home  Chief Complaint: AMS  HPI: Kerry [name] is a 64 y.o. female with medical history significant for left-sided CVA with residual right-sided deficits, prior PE/DVT, CAD, hypertension, hyperlipidemia, and type 2 diabetes who presented to the ED via EMS with altered mentation. Apparently she was with her husband at home who noticed that she suddenly collapsed and became quite confused. Her son came to the house to evaluate her and she was noted to be somewhat diaphoretic and had called EMS. Patient claims that she was having some burning with urination over the last couple days and did have some diarrhea as well.   ED Course: Patient was noted to be febrile with temperature 101.1 and was noted to be quite hypotensive on arrival. She was bolused with 30 cc/kg of IV fluid with LR and has had improvement in her blood pressure readings with over 90 systolic. She is noted to be quite hypokalemic with potassium 2.0 and lactic acid is 1.8. She is noted to have findings of UTI and urine analysis and urine cultures and blood cultures are currently pending. She has been empirically started on Rocephin. She is noted to have leukocytosis of 20,500. Hemoglobin is 11.9 and creatinine is quite elevated at 4.65. BUN is 57. She is also noted to be thrombocytopenic with platelet count of 94,000 and has a sodium of 133. Chest x-ray with no acute findings noted.  Review of Systems: Difficult to obtain, but otherwise negative except as noted above.  Past Medical History:  Diagnosis Date  . Breast cancer ([place]) 03/10/2007   Left breat  . Carpal tunnel syndrome on right   . Coronary artery disease   . Degenerative disc disease, cervical   . Degenerative disc disease, lumbar   . Diabetes mellitus   . Elevated troponin   . Fibromyalgia   . GIB (gastrointestinal bleeding)    . HTN (hypertension) 06/13/2015  . Hyperlipidemia   . Hypertension   . PE (pulmonary embolism)   . 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  . Stroke [facility]) 2009   paralyzed on right side  . UTI (lower urinary tract infection)     Past Surgical History:  Procedure Laterality Date  . APPENDECTOMY    . BREAST SURGERY    . CARDIAC CATHETERIZATION  2005  . CARDIAC CATHETERIZATION N/A 04/13/2015   Procedure: Temporary Pacemaker;  Surgeon: Peter M Martinique, MD;  Location: [place] [facility];  Service: Cardiovascular;  Laterality: N/A;  . CARDIAC SURGERY    . ESOPHAGOGASTRODUODENOSCOPY N/A 04/18/2015   Procedure: ESOPHAGOGASTRODUODENOSCOPY (EGD);  Surgeon: Judeth Horn, MD;  Location: [place];  Service: General;  Laterality: N/A;  . IR GASTROSTOMY TUBE REMOVAL  11/05/2016  . MASTECTOMY Left 03/2007  . PEG PLACEMENT N/A 04/18/2015   Procedure: PERCUTANEOUS ENDOSCOPIC GASTROSTOMY (PEG) PLACEMENT;  Surgeon: Judeth Horn, MD;  Location: [place];  Service: General;  Laterality: N/A;  bedside  . RADIOLOGY WITH ANESTHESIA N/A 04/11/2015   Procedure: RADIOLOGY WITH ANESTHESIA;  Surgeon: Luanne Bras, MD;  Location: [place];  Service: Radiology;  Laterality: N/A;  . TUBAL LIGATION Right   . VEIN SURGERY Right 2005     reports that she quit smoking about 19 years ago. She smoked 0.25 packs per day. She has never used smokeless tobacco. She reports current alcohol  use. She reports that she does not use drugs.  Allergies  Allergen Reactions  . Codeine Nausea Only    Nausea?-- "made her sick"  . Oxycodone Nausea Only    Sick   . Oxycontin [Oxycodone Hcl] Nausea Only    Sick     Family History  Problem Relation Age of Onset  . Cancer Mother        Breast cancer (right)  . Cancer Maternal Aunt        Breast cancer  . Cancer Maternal Aunt        Breast cancer  . Cancer Cousin        Breast Cancer  . Diabetes Sister   . Colon cancer  Neg Hx     Prior to Admission medications   Medication Sig Start Date End Date Taking? Authorizing Provider  amLODipine (NORVASC) 10 MG tablet Take 1 tablet (10 mg total) by mouth daily. 07/09/15   Mikhail, Velta Addison, DO  atorvastatin (LIPITOR) 40 MG tablet Take 1 tablet (40 mg total) by mouth daily at 6 PM. 07/09/15   [name], DO  carvedilol (COREG) 12.5 MG tablet Take 1 tablet (12.5 mg total) by mouth 2 (two) times daily with a meal. 07/09/15   Mikhail, Velta Addison, DO  cefdinir (OMNICEF) 300 MG capsule Take 1 capsule (300 mg total) by mouth 2 (two) times daily. 01/13/19   Erick Colace, NP  Cholecalciferol ([employer] D3) 5000 units capsule Take 5,000 Units by mouth daily.    [provider]  citalopram (CELEXA) 20 MG tablet Take 20 mg by mouth daily. 07/28/15   [provider]  fluticasone (FLONASE) 50 MCG/ACT nasal spray Place 2 sprays into both nostrils daily. Patient taking differently: Place 2 sprays into both nostrils daily as needed for allergies.  10/30/16   Erick Colace, NP  Insulin Glargine (LANTUS) 100 UNIT/ML Solostar Pen Inject 5 Units into the skin daily at 10 pm. Patient taking differently: Inject 30 Units into the skin daily at 10 pm.  07/09/15   [name], DO  Insulin Pen Needle 32G X 4 MM MISC Use with insulin pen to dispense insulin daily 07/09/15   [name], DO  lisinopril (PRINIVIL,ZESTRIL) 10 MG tablet Take 1 tablet (10 mg total) by mouth daily. 07/09/15   Mikhail, Velta Addison, DO  metroNIDAZOLE (FLAGYL) 500 MG tablet Take 1 tablet (500 mg total) by mouth 2 (two) times daily. One po bid x 7 days 09/19/17   Veryl Speak, MD  rivaroxaban (XARELTO) 20 MG TABS tablet Take 1 tablet (20 mg total) by mouth daily with supper. 07/09/15   Mikhail, Velta Addison, DO  traMADol (ULTRAM) 50 MG tablet Take 50 mg by mouth every 6 (six) hours as needed for moderate pain.     [provider]    Physical Exam: Vitals:   02/02/20 1330 02/02/20 1400 02/02/20 1416  02/02/20 1430  BP: 100/61 (!) 88/45 (!) 92/49 (!) 84/46  Pulse:  81 84 82  Resp: (!) 25 (!) 32 (!) 31 (!) 26  Temp:      TempSrc:      SpO2:  91% 93% 95%    Constitutional: NAD, calm, comfortable Vitals:   02/02/20 1330 02/02/20 1400 02/02/20 1416 02/02/20 1430  BP: 100/61 (!) 88/45 (!) 92/49 (!) 84/46  Pulse:  81 84 82  Resp: (!) 25 (!) 32 (!) 31 (!) 26  Temp:      TempSrc:      SpO2:  91% 93% 95%  Eyes: lids and conjunctivae normal ENMT: Mucous membranes are moist.  Neck: normal, supple Respiratory: clear to auscultation bilaterally. Normal respiratory effort. No accessory muscle use. Tracheostomy present with oxygen supplementation. Cardiovascular: Regular rate and rhythm, no murmurs. No extremity edema. Abdomen: no tenderness, mild distention. Bowel sounds positive.  Musculoskeletal: No edema Skin: no rashes, lesions, ulcers.  Psychiatric: Flat affect  Labs on Admission: I have personally reviewed following labs and imaging studies  CBC: Recent Labs  Lab 02/02/20 1125  WBC 20.5*  NEUTROABS 17.4*  HGB 11.9*  HCT 36.1  MCV 81.7  PLT 94*   Basic Metabolic Panel: Recent Labs  Lab 02/02/20 1125  NA 133*  K <2.0*  CL 96*  CO2 20*  GLUCOSE 113*  BUN 57*  CREATININE 4.65*  CALCIUM 7.1*   GFR: CrCl cannot be calculated (Unknown ideal weight.). Liver Function Tests: Recent Labs  Lab 02/02/20 1125  AST 13*  ALT 12  ALKPHOS 65  BILITOT 0.9  PROT 6.2*  ALBUMIN 2.6*   No results for input(s): LIPASE, AMYLASE in the last 168 hours. No results for input(s): AMMONIA in the last 168 hours. Coagulation Profile: Recent Labs  Lab 02/02/20 1125  INR 1.7*   Cardiac Enzymes: No results for input(s): CKTOTAL, CKMB, CKMBINDEX, TROPONINI in the last 168 hours. BNP (last 3 results) No results for input(s): PROBNP in the last 8760 hours. HbA1C: No results for input(s): HGBA1C in the last 72 hours. CBG: No results for input(s): GLUCAP in the last 168  hours. Lipid Profile: No results for input(s): CHOL, HDL, LDLCALC, TRIG, CHOLHDL, LDLDIRECT in the last 72 hours. Thyroid Function Tests: No results for input(s): TSH, T4TOTAL, FREET4, T3FREE, THYROIDAB in the last 72 hours. Anemia Panel: No results for input(s): VITAMINB12, FOLATE, FERRITIN, TIBC, IRON, RETICCTPCT in the last 72 hours. Urine analysis:    Component Value Date/Time   COLORURINE BROWN (A) 02/02/2020 1141   APPEARANCEUR TURBID (A) 02/02/2020 1141   LABSPEC 1.020 02/02/2020 1141   PHURINE 6.0 02/02/2020 1141   GLUCOSEU NEGATIVE 02/02/2020 1141   HGBUR LARGE (A) 02/02/2020 1141   BILIRUBINUR NEGATIVE 02/02/2020 1141   KETONESUR NEGATIVE 02/02/2020 1141   PROTEINUR >300 (A) 02/02/2020 1141   UROBILINOGEN 1.0 04/29/2015 0703   NITRITE NEGATIVE 02/02/2020 1141   LEUKOCYTESUR LARGE (A) 02/02/2020 1141    Radiological Exams on Admission: DG Chest Port 1 View  Result Date: 02/02/2020 CLINICAL DATA:  Altered mental status EXAM: PORTABLE CHEST 1 VIEW COMPARISON:  2019 FINDINGS: Tracheostomy device is present. Patient is rotated. Stable chronic interstitial prominence. No significant pleural effusion. No pneumothorax. Similar cardiomediastinal contours with probable mild cardiomegaly. IMPRESSION: No acute process in the chest. Electronically Signed   By: Macy Mis M.D.   On: 02/02/2020 12:43    EKG: Independently reviewed. NSR 92bpm.  Assessment/Plan Active Problems:   Severe sepsis (HCC)    Severe sepsis secondary to UTI -Blood and urine cultures collected and pending -Rocephin initiated -Improvement in blood pressure with 30 cc/kg bolus, continue bolus and aggressive IV fluid hydration -Monitor lactic acid level and CBC -Continue to monitor hypotension closely and bolus as needed, may need vasopressor initiation if blood pressure drops  Severe AKI likely secondary to above -Continue monitoring and avoid nephrotoxic agents -Aggressive IV fluid  hydration -Monitor strict I's and O's -Repeat a.m. labs  Severe hypokalemia -Likely related to recent increase in liquid bowel movements -Replete and monitor on telemetry -Reevaluate in a.m. -Repleted with 60 mEq IV for now, avoid further  repletion given AKI  Mild abdominal distention -Without any nausea or vomiting currently noted -We will try clear liquid diet -Likely related to hypokalemia, continue monitoring  Thrombocytopenia -Likely secondary to sepsis physiology -Continue on Xarelto -Monitor on repeat CBC  Mild hyponatremia -Likely related to dehydration -Continue monitoring  Prior left-sided CVA with residual deficits -Prior tracheostomy from this episode  Prior PE/DVT -Continue anticoagulation with Xarelto  History of hypertension -Hold blood pressure medications given soft blood pressure readings  History of dyslipidemia -Continue statin  History of type 2 diabetes -Currently without hyperglycemia -Hold home Lantus and start SSI   DVT prophylaxis: Xarelto Code Status: Full code Family Communication: Discussed with son at bedside. Disposition Plan:Admit for treatment of UTI/sepsis Consults called:None Admission status: Inpatient, SDU   Geordie Nooney D Elida Harbin DO [facility]  If 7PM-7AM, please contact night-coverage [email or web address]  02/02/2020, 3:07 PM

## 2020-02-03 ENCOUNTER — Encounter (HOSPITAL_COMMUNITY): Payer: Self-pay | Admitting: Internal Medicine

## 2020-02-03 LAB — BLOOD CULTURE ID PANEL (REFLEXED) - BCID2

## 2020-02-03 LAB — PROTIME-INR
INR: 2.5 — ABNORMAL HIGH (ref 0.8–1.2)
Prothrombin Time: 26.4 seconds — ABNORMAL HIGH (ref 11.4–15.2)

## 2020-02-03 LAB — CBC
HCT: 40.3 % (ref 36.0–46.0)
Hemoglobin: 12.7 g/dL (ref 12.0–15.0)
MCH: 26.8 pg (ref 26.0–34.0)
MCHC: 31.5 g/dL (ref 30.0–36.0)
MCV: 85 fL (ref 80.0–100.0)
Platelets: 77 10*3/uL — ABNORMAL LOW (ref 150–400)
RBC: 4.74 MIL/uL (ref 3.87–5.11)
RDW: 14.3 % (ref 11.5–15.5)
WBC: 12.7 10*3/uL — ABNORMAL HIGH (ref 4.0–10.5)
nRBC: 0 % (ref 0.0–0.2)

## 2020-02-03 LAB — COMPREHENSIVE METABOLIC PANEL
ALT: 14 U/L (ref 0–44)
AST: 15 U/L (ref 15–41)
Albumin: 2.6 g/dL — ABNORMAL LOW (ref 3.5–5.0)
Alkaline Phosphatase: 78 U/L (ref 38–126)
Anion gap: 10 (ref 5–15)
BUN: 43 mg/dL — ABNORMAL HIGH (ref 8–23)
CO2: 22 mmol/L (ref 22–32)
Calcium: 7.4 mg/dL — ABNORMAL LOW (ref 8.9–10.3)
Chloride: 110 mmol/L (ref 98–111)
Creatinine, Ser: 2.32 mg/dL — ABNORMAL HIGH (ref 0.44–1.00)
GFR calc Af Amer: 25 mL/min — ABNORMAL LOW (ref >60)
GFR calc non Af Amer: 22 mL/min — ABNORMAL LOW (ref >60)
Glucose, Bld: 147 mg/dL — ABNORMAL HIGH (ref 70–99)
Potassium: 2.5 mmol/L — CL (ref 3.5–5.1)
Sodium: 142 mmol/L (ref 135–145)
Total Bilirubin: 0.6 mg/dL (ref 0.3–1.2)
Total Protein: 6.7 g/dL (ref 6.5–8.1)

## 2020-02-03 LAB — GLUCOSE, CAPILLARY
Glucose-Capillary: 206 mg/dL — ABNORMAL HIGH (ref 70–99)
Glucose-Capillary: 114 mg/dL — ABNORMAL HIGH (ref 70–99)
Glucose-Capillary: 144 mg/dL — ABNORMAL HIGH (ref 70–99)
Glucose-Capillary: 166 mg/dL — ABNORMAL HIGH (ref 70–99)

## 2020-02-03 LAB — CORTISOL-AM, BLOOD: Cortisol - AM: 21.2 ug/dL (ref 6.7–22.6)

## 2020-02-03 LAB — LACTIC ACID, PLASMA: Lactic Acid, Venous: 1.6 mmol/L (ref 0.5–1.9)

## 2020-02-03 LAB — POTASSIUM: Potassium: 3.1 mmol/L — ABNORMAL LOW (ref 3.5–5.1)

## 2020-02-03 LAB — MAGNESIUM: Magnesium: 2.1 mg/dL (ref 1.7–2.4)

## 2020-02-03 LAB — MRSA PCR SCREENING: MRSA by PCR: NEGATIVE

## 2020-02-03 LAB — PROCALCITONIN: Procalcitonin: >150 ng/mL

## 2020-02-03 MED ORDER — CHLORHEXIDINE GLUCONATE CLOTH 2 % EX PADS
6.0000 | MEDICATED_PAD | Freq: Every day | CUTANEOUS | Status: DC
  Administered 2020-02-03 – 2020-02-09 (×7): 6 via TOPICAL

## 2020-02-03 MED ORDER — SODIUM CHLORIDE 0.9 % IV SOLN
2.0000 g | INTRAVENOUS | Status: DC
  Administered 2020-02-04 – 2020-02-09 (×6): 2 g via INTRAVENOUS
  Filled 2020-02-03 (×6): qty 20

## 2020-02-03 MED ORDER — SODIUM CHLORIDE 0.9 % IV SOLN
1.0000 g | Freq: Once | INTRAVENOUS | Status: AC
  Administered 2020-02-03: 1 g via INTRAVENOUS
  Filled 2020-02-03: qty 10

## 2020-02-03 MED ORDER — POTASSIUM CHLORIDE 10 MEQ/50ML IV SOLN: 10.0000 meq | INTRAVENOUS | Status: DC

## 2020-02-03 MED ORDER — POTASSIUM CHLORIDE 10 MEQ/100ML IV SOLN
10.0000 meq | INTRAVENOUS | Status: AC
  Administered 2020-02-03 (×6): 10 meq via INTRAVENOUS
  Filled 2020-02-03 (×4): qty 100

## 2020-02-03 MED ORDER — POTASSIUM CHLORIDE 10 MEQ/100ML IV SOLN
10.0000 meq | INTRAVENOUS | Status: AC
  Filled 2020-02-03 (×2): qty 100

## 2020-02-03 MED ORDER — POTASSIUM CHLORIDE CRYS ER 20 MEQ PO TBCR
40.0000 meq | EXTENDED_RELEASE_TABLET | Freq: Once | ORAL | Status: AC
  Administered 2020-02-03: 40 meq via ORAL
  Filled 2020-02-03: qty 2

## 2020-02-03 NOTE — Plan of Care (Signed)
  Problem: Education: Goal: Knowledge of General Education information will improve Description: Including pain rating scale, medication(s)/side effects and non-pharmacologic comfort measures Outcome: Progressing   Problem: Health Behavior/Discharge Planning: Goal: Ability to manage health-related needs will improve Outcome: Progressing   Problem: Clinical Measurements: Goal: Ability to maintain clinical measurements within normal limits will improve Outcome: Progressing Goal: Will remain free from infection Outcome: Progressing Goal: Diagnostic test results will improve Outcome: Progressing Goal: Respiratory complications will improve Outcome: Progressing Goal: Cardiovascular complication will be avoided Outcome: Progressing   Problem: Activity: Goal: Risk for activity intolerance will decrease Outcome: Progressing   Problem: Nutrition: Goal: Adequate nutrition will be maintained Outcome: Progressing   Problem: Coping: Goal: Level of anxiety will decrease Outcome: Progressing   Problem: Elimination: Goal: Will not experience complications related to bowel motility Outcome: Progressing Goal: Will not experience complications related to urinary retention Outcome: Progressing   Problem: Coping: Goal: Level of anxiety will decrease Outcome: Progressing   Problem: Elimination: Goal: Will not experience complications related to bowel motility Outcome: Progressing Goal: Will not experience complications related to urinary retention Outcome: Progressing   Problem: Skin Integrity: Goal: Risk for impaired skin integrity will decrease Outcome: Progressing

## 2020-02-03 NOTE — Progress Notes (Signed)
PROGRESS NOTE    Kerry [name]  FIE:[id number] DOB: 03/01/1956 DOA: 02/02/2020 PCP: Blair Heys, PA-C   Brief Narrative:  Per HPI: Kerry [name] is a 64 y.o. female with medical history significant for left-sided CVA with residual right-sided deficits, prior PE/DVT, CAD, hypertension, hyperlipidemia, and type 2 diabetes who presented to the ED via EMS with altered mentation. Apparently she was with her husband at home who noticed that she suddenly collapsed and became quite confused. Her son came to the house to evaluate her and she was noted to be somewhat diaphoretic and had called EMS. Patient claims that she was having some burning with urination over the last couple days and did have some diarrhea as well.  -Patient was admitted with severe sepsis secondary to UTI along with associated AKI and severe hypokalemia in the setting of chronic diarrhea.  8/13: Sepsis physiology is resolving this morning with treatment of UTI.  She is noted to have gram-negative rods on anaerobic bottle.  AKI is also resolving with aggressive IV fluid.  Potassium levels are improving as well.  Continue IV fluid resuscitation as well as IV antibiotics and advance diet today supplement further potassium and transfer to telemetry if stable.  Assessment & Plan:   Active Problems:   Severe sepsis (HCC)  Severe sepsis secondary to UTI--improving -Gram-negative rods noted on urine culture with full ID and sensitivity pending -Blood culture with anaerobic bottle with gram-negative rods noted as well -Rocephin to continue for now -Continue aggressive IV fluid -Monitor lactic acid level and CBC  Severe AKI likely secondary to above-improving -Continue monitoring and avoid nephrotoxic agents -Aggressive IV fluid hydration to continue -Monitor strict I's and O's -Repeat a.m. labs  Severe hypokalemia-improving -Likely related to recent increase in liquid bowel movements -Replete and monitor on  telemetry -Reevaluate in a.m. -Continue ongoing repletion with IV and p.o. today  Mild abdominal distention -Without any nausea or vomiting currently noted -Advance diet to full today -Likely related to hypokalemia, continue monitoring  Thrombocytopenia-downtrending -Likely secondary to sepsis physiology -Continue on Xarelto -Monitor on repeat CBC  Mild hyponatremia-resolved -Continue monitoring  Prior left-sided CVA with residual deficits -Prior tracheostomy from this episode  Prior PE/DVT -Continue anticoagulation with Xarelto at lower dose  History of hypertension -Hold blood pressure medications given soft blood pressure readings -Plan to resume by tomorrow stable and improved  History of dyslipidemia -Continue statin  History of type 2 diabetes -Currently without hyperglycemia -Hold home Lantus and continue SSI -Start home Lantus once diet tolerated well  DVT prophylaxis: Xarelto Code Status: Full code Family Communication: Discussed with son [name] on phone 8/13 Disposition Plan:   Status is: Inpatient  Remains inpatient appropriate because:IV treatments appropriate due to intensity of illness or inability to take PO and Inpatient level of care appropriate due to severity of illness   Dispo: The patient is from: Home              Anticipated d/c is to: Home              Anticipated d/c date is: 2 days              Patient currently is not medically stable to d/c.  Patient continues to have significant hypokalemia as well as AKI requiring IV fluid.  She requires ongoing treatment of sepsis with UTI and gram-negative rods.  Consultants:   None  Procedures:   See below  Antimicrobials:  Anti-infectives (From admission, onward)   Start  Dose/Rate Route Frequency Ordered Stop   02/02/20 1215  cefTRIAXone (ROCEPHIN) 1 g in sodium chloride 0.9 % 100 mL IVPB     Discontinue     1 g 200 mL/hr over 30 Minutes Intravenous Every 24 hours 02/02/20 1204          Subjective: Patient seen and evaluated today with no new acute complaints or concerns. No acute concerns or events noted overnight.  Objective: Vitals:   02/03/20 1000 02/03/20 1126 02/03/20 1200 02/03/20 1300  BP: (!) 112/49  122/68 (!) 111/39  Pulse: 81   87  Resp: 20 19 18 15   Temp:  99.5 F (37.5 C)    TempSrc:  Oral    SpO2: 98%   97%  Weight:      Height:        Intake/Output Summary (Last 24 hours) at 02/03/2020 1341 Last data filed at 02/03/2020 1127 Gross per 24 hour  Intake 1112.11 ml  Output 1900 ml  Net -787.89 ml   Filed Weights   02/02/20 1600 02/03/20 0200  Weight: 99.8 kg 94.7 kg    Examination:  General exam: Appears calm and comfortable  Respiratory system: Clear to auscultation. Respiratory effort normal. Cardiovascular system: S1 & S2 heard, RRR.  Gastrointestinal system: Abdomen is minimally distended Central nervous system: Alert and awake Extremities: No edema Skin: No rashes, lesions or ulcers Psychiatry: Judgement and insight appear normal. Mood & affect appropriate.     Data Reviewed: I have personally reviewed following labs and imaging studies  CBC: Recent Labs  Lab 02/02/20 1125 02/03/20 0317  WBC 20.5* 12.7*  NEUTROABS 17.4*  --   HGB 11.9* 12.7  HCT 36.1 40.3  MCV 81.7 85.0  PLT 94* 77*   Basic Metabolic Panel: Recent Labs  Lab 02/02/20 1125 02/03/20 0317  NA 133* 142  K <2.0* 2.5*  CL 96* 110  CO2 20* 22  GLUCOSE 113* 147*  BUN 57* 43*  CREATININE 4.65* 2.32*  CALCIUM 7.1* 7.4*  MG  --  2.1   GFR: Estimated Creatinine Clearance: 28.6 mL/min (A) (by C-G formula based on SCr of 2.32 mg/dL (H)). Liver Function Tests: Recent Labs  Lab 02/02/20 1125 02/03/20 0317  AST 13* 15  ALT 12 14  ALKPHOS 65 78  BILITOT 0.9 0.6  PROT 6.2* 6.7  ALBUMIN 2.6* 2.6*   No results for input(s): LIPASE, AMYLASE in the last 168 hours. No results for input(s): AMMONIA in the last 168 hours. Coagulation  Profile: Recent Labs  Lab 02/02/20 1125 02/03/20 0317  INR 1.7* 2.5*   Cardiac Enzymes: No results for input(s): CKTOTAL, CKMB, CKMBINDEX, TROPONINI in the last 168 hours. BNP (last 3 results) No results for input(s): PROBNP in the last 8760 hours. HbA1C: Recent Labs    02/02/20 1141  HGBA1C 8.9*   CBG: Recent Labs  Lab 02/02/20 1151 02/02/20 2153 02/02/20 2338 02/03/20 0814 02/03/20 1124  GLUCAP 118* 206* 201* 114* 144*   Lipid Profile: No results for input(s): CHOL, HDL, LDLCALC, TRIG, CHOLHDL, LDLDIRECT in the last 72 hours. Thyroid Function Tests: No results for input(s): TSH, T4TOTAL, FREET4, T3FREE, THYROIDAB in the last 72 hours. Anemia Panel: No results for input(s): VITAMINB12, FOLATE, FERRITIN, TIBC, IRON, RETICCTPCT in the last 72 hours. Sepsis Labs: Recent Labs  Lab 02/02/20 1125 02/02/20 1348 02/03/20 0317  PROCALCITON  --   --  >150.00  LATICACIDVEN 1.8 1.9 1.6    Recent Results (from the past 240 hour(s))  Culture, blood (Routine  x 2)     Status: None (Preliminary result)   Collection Time: 02/02/20 11:41 AM   Specimen: BLOOD  Result Value Ref Range Status   Specimen Description   Final    BLOOD LEFT ANTECUBITAL Performed at [facility], [address], [place], [place] [address]    Special Requests   Final    BOTTLES DRAWN AEROBIC AND ANAEROBIC Blood Culture adequate volume Performed at [facility], [address] [place], [place] [address]    Culture  Setup Time   Final    WBC SEEN GRAM NEGATIVE RODS Gram Stain Report Called to,Read Back By and Verified With: HEARN,J AT 0703 ON 02/03/20 BY HUFFINES,S. GRAM NEGATIVE RODS ANAEROBIC BOTTLE ONLY Gram Stain Report Called to,Read Back By and Verified With: PREVIOUSLY CALLED @[id number]  08/13/2021KAY Organism ID to follow Performed at [facility], [place] [address] [place], [place] [address]    Culture GRAM NEGATIVE RODS  Final   Report Status PENDING  Incomplete  SARS Coronavirus 2 by RT  PCR (hospital order, performed in [facility] hospital lab) Nasopharyngeal Nasopharyngeal Swab     Status: None   Collection Time: 02/02/20 12:02 PM   Specimen: Nasopharyngeal Swab  Result Value Ref Range Status   SARS Coronavirus 2 NEGATIVE NEGATIVE Final    Comment: (NOTE) SARS-CoV-2 target nucleic acids are NOT DETECTED.  The SARS-CoV-2 RNA is generally detectable in upper and lower respiratory specimens during the acute phase of infection. The lowest concentration of SARS-CoV-2 viral copies this assay can detect is 250 copies / mL. A negative result does not preclude SARS-CoV-2 infection and should not be used as the sole basis for treatment or other patient management decisions.  A negative result may occur with improper specimen collection / handling, submission of specimen other than nasopharyngeal swab, presence of viral mutation(s) within the areas targeted by this assay, and inadequate number of viral copies (<250 copies / mL). A negative result must be combined with clinical observations, patient history, and epidemiological information.  Fact Sheet for Patients:   StrictlyIdeas.no  Fact Sheet for Healthcare Providers: BankingDealers.co.za  This test is not yet approved or  cleared by the [place] FDA and has been authorized for detection and/or diagnosis of SARS-CoV-2 by FDA under an Emergency Use Authorization (EUA).  This EUA will remain in effect (meaning this test can be used) for the duration of the COVID-19 declaration under Section 564(b)(1) of the Act, 21 U.S.C. section 360bbb-3(b)(1), unless the authorization is terminated or revoked sooner.  Performed at [facility], [address], [place], [place] [address]   Urine culture     Status: Abnormal (Preliminary result)   Collection Time: 02/02/20 12:04 PM   Specimen: In/Out Cath Urine  Result Value Ref Range Status   Specimen Description   Final    IN/OUT  CATH URINE Performed at [facility], [address], [place], [place] [address]    Special Requests   Final    NONE Performed at [facility], [address] [place], [place] [address]    Culture >=100,000 COLONIES/mL GRAM NEGATIVE RODS (A)  Final   Report Status PENDING  Incomplete  Culture, blood (Routine x 2)     Status: None (Preliminary result)   Collection Time: 02/02/20 12:34 PM   Specimen: BLOOD  Result Value Ref Range Status   Specimen Description BLOOD RIGHT HAND  Final   Special Requests   Final    BOTTLES DRAWN AEROBIC AND ANAEROBIC Blood Culture adequate  volume   Culture   Final    NO GROWTH < 24 HOURS Performed at [facility], [address], [place], [place] [address]    Report Status PENDING  Incomplete  MRSA PCR Screening     Status: None   Collection Time: 02/03/20  1:41 AM   Specimen: Nasal Mucosa; Nasopharyngeal  Result Value Ref Range Status   MRSA by PCR NEGATIVE NEGATIVE Final    Comment:        The GeneXpert MRSA Assay (FDA approved for NASAL specimens only), is one component of a comprehensive MRSA colonization surveillance program. It is not intended to diagnose MRSA infection nor to guide or monitor treatment for MRSA infections. Performed at [facility], [address], [place], [place] [address]          Radiology Studies: [facility] Chest [facility] 1 View  Result Date: 02/02/2020 CLINICAL DATA:  Altered mental status EXAM: PORTABLE CHEST 1 VIEW COMPARISON:  2019 FINDINGS: Tracheostomy device is present. Patient is rotated. Stable chronic interstitial prominence. No significant pleural effusion. No pneumothorax. Similar cardiomediastinal contours with probable mild cardiomegaly. IMPRESSION: No acute process in the chest. Electronically Signed   By: Macy Mis M.D.   On: 02/02/2020 12:43        Scheduled Meds: . atorvastatin  40 mg Oral q1800  . Chlorhexidine Gluconate Cloth  6 each Topical Daily  . citalopram  20 mg Oral Daily  . insulin  aspart  0-5 Units Subcutaneous QHS  . insulin aspart  0-9 Units Subcutaneous TID WC  . rivaroxaban  10 mg Oral Q supper   Continuous Infusions: . sodium chloride Stopped (02/03/20 0158)  . cefTRIAXone (ROCEPHIN)  IV 1 g (02/03/20 1134)  . potassium chloride 10 mEq (02/03/20 1200)     LOS: 1 day    Time spent: 35 minutes    [place] Darleen Crocker, DO [facility]  If 7PM-7AM, please contact night-coverage [email or web address] 02/03/2020, 1:41 PM

## 2020-02-04 LAB — BASIC METABOLIC PANEL
Anion gap: 11 (ref 5–15)
BUN: 13 mg/dL (ref 8–23)
CO2: 24 mmol/L (ref 22–32)
Calcium: 7.7 mg/dL — ABNORMAL LOW (ref 8.9–10.3)
Chloride: 105 mmol/L (ref 98–111)
Creatinine, Ser: 0.92 mg/dL (ref 0.44–1.00)
GFR calc Af Amer: >60 mL/min (ref >60)
GFR calc non Af Amer: >60 mL/min (ref >60)
Glucose, Bld: 96 mg/dL (ref 70–99)
Potassium: 2.5 mmol/L — CL (ref 3.5–5.1)
Sodium: 140 mmol/L (ref 135–145)

## 2020-02-04 LAB — CBC
HCT: 38.8 % (ref 36.0–46.0)
Hemoglobin: 12.4 g/dL (ref 12.0–15.0)
MCH: 26.5 pg (ref 26.0–34.0)
MCHC: 32 g/dL (ref 30.0–36.0)
MCV: 82.9 fL (ref 80.0–100.0)
Platelets: 86 10*3/uL — ABNORMAL LOW (ref 150–400)
RBC: 4.68 MIL/uL (ref 3.87–5.11)
RDW: 14.6 % (ref 11.5–15.5)
WBC: 10.7 10*3/uL — ABNORMAL HIGH (ref 4.0–10.5)
nRBC: 0 % (ref 0.0–0.2)

## 2020-02-04 LAB — URINE CULTURE: Culture: >=100000 — AB

## 2020-02-04 LAB — POTASSIUM: Potassium: 2.8 mmol/L — ABNORMAL LOW (ref 3.5–5.1)

## 2020-02-04 LAB — GLUCOSE, CAPILLARY
Glucose-Capillary: 96 mg/dL (ref 70–99)
Glucose-Capillary: 173 mg/dL — ABNORMAL HIGH (ref 70–99)
Glucose-Capillary: 159 mg/dL — ABNORMAL HIGH (ref 70–99)
Glucose-Capillary: 135 mg/dL — ABNORMAL HIGH (ref 70–99)

## 2020-02-04 LAB — LACTIC ACID, PLASMA: Lactic Acid, Venous: 1 mmol/L (ref 0.5–1.9)

## 2020-02-04 LAB — MAGNESIUM: Magnesium: 1.4 mg/dL — ABNORMAL LOW (ref 1.7–2.4)

## 2020-02-04 MED ORDER — CARVEDILOL 12.5 MG PO TABS
12.5000 mg | ORAL_TABLET | Freq: Two times a day (BID) | ORAL | Status: DC
  Administered 2020-02-04 – 2020-02-09 (×9): 12.5 mg via ORAL
  Filled 2020-02-04 (×2): qty 4
  Filled 2020-02-04: qty 1
  Filled 2020-02-04: qty 4
  Filled 2020-02-04 (×2): qty 1
  Filled 2020-02-04 (×2): qty 4
  Filled 2020-02-04: qty 1

## 2020-02-04 MED ORDER — MAGNESIUM SULFATE 2 GM/50ML IV SOLN
2.0000 g | Freq: Once | INTRAVENOUS | Status: AC
  Administered 2020-02-04: 2 g via INTRAVENOUS
  Filled 2020-02-04: qty 50

## 2020-02-04 MED ORDER — POTASSIUM CHLORIDE 10 MEQ/100ML IV SOLN
10.0000 meq | INTRAVENOUS | Status: AC
  Administered 2020-02-04 (×6): 10 meq via INTRAVENOUS
  Filled 2020-02-04 (×6): qty 100

## 2020-02-04 MED ORDER — POTASSIUM CHLORIDE 10 MEQ/100ML IV SOLN
10.0000 meq | INTRAVENOUS | Status: AC
  Administered 2020-02-04 – 2020-02-05 (×6): 10 meq via INTRAVENOUS
  Filled 2020-02-04 (×6): qty 100

## 2020-02-04 MED ORDER — AMLODIPINE BESYLATE 5 MG PO TABS
10.0000 mg | ORAL_TABLET | Freq: Every day | ORAL | Status: DC
  Administered 2020-02-04 – 2020-02-09 (×6): 10 mg via ORAL
  Filled 2020-02-04 (×5): qty 2

## 2020-02-04 MED ORDER — LOPERAMIDE HCL 2 MG PO CAPS
2.0000 mg | ORAL_CAPSULE | ORAL | Status: DC | PRN
  Administered 2020-02-05: 2 mg via ORAL
  Filled 2020-02-04: qty 1

## 2020-02-04 MED ORDER — RIVAROXABAN 20 MG PO TABS
20.0000 mg | ORAL_TABLET | Freq: Every day | ORAL | Status: DC
  Administered 2020-02-04 – 2020-02-08 (×4): 20 mg via ORAL
  Filled 2020-02-04 (×5): qty 1

## 2020-02-04 MED ORDER — POTASSIUM CHLORIDE CRYS ER 20 MEQ PO TBCR
40.0000 meq | EXTENDED_RELEASE_TABLET | Freq: Three times a day (TID) | ORAL | Status: DC
  Administered 2020-02-04 (×3): 40 meq via ORAL
  Filled 2020-02-04 (×3): qty 2

## 2020-02-04 NOTE — Progress Notes (Addendum)
PROGRESS NOTE    Kerry [name]  QQV:[id number] DOB: [birth year] DOA: 02/02/2020 PCP: Kerry Heys, PA-C   Brief Narrative:  Per HPI: Kerry Delafuente Pattersonis a 64 y.o.femalewith medical history significant forleft-sided CVA with residual right-sided deficits, prior PE/DVT, CAD, hypertension, hyperlipidemia, and type 2 diabeteswho presented to the ED via EMS with altered mentation. Apparently she was with her husband at home who noticed that she suddenly collapsed and became quite confused. Her son came to the house to evaluate her and she was noted to be somewhat diaphoretic and had called EMS. Patient claims that she was having some burning with urination over the last couple days and did have some diarrhea as well.  -Patient was admitted with severe sepsis secondary to UTI along with associated AKI and severe hypokalemia in the setting of chronic diarrhea.  8/13: Sepsis physiology is resolving this morning with treatment of UTI.  She is noted to have gram-negative rods on anaerobic bottle.  AKI is also resolving with aggressive IV fluid.  Potassium levels are improving as well.  Continue IV fluid resuscitation as well as IV antibiotics and advance diet today supplement further potassium and transfer to telemetry if stable.  8/14: Patient continues to have persistent hypokalemia, but kidney function is improved.  Sepsis physiology appears to be resolving.  Noted to have Klebsiella and Proteus UTI with associated bacteremia.  Appears to have some ongoing loose stools.  Assessment & Plan:   Active Problems:   Severe sepsis (HCC)   Severe sepsis secondary to Klebsiella and Proteus UTI with associated bacteremia -improving -Continue Rocephin 2 g IV daily -Decrease IV fluid rate -Monitor a.m. CBC  Severe AKI likely secondary to above-resolved -Decrease IV fluid rate -Increased urine output noted -Monitor strict I's and O's -Repeat a.m. labs  Severe  hypokalemia-persistent -Likely related to recent increase in liquid bowel movements -Imodium as needed -Replete and monitor on telemetry -Reevaluate in a.m. -Continue ongoing repletion with IV and p.o. today  Hypomagnesemia -Replete and reevaluate in a.m.  Mild abdominal distention with diarrhea -Without any nausea or vomiting currently noted -Appears to be tolerating diet -Imodium for diarrhea -Likely related to hypokalemia, continue monitoring  Thrombocytopenia-stable -Likely secondary to sepsis physiology -Continue on Xarelto, pharmacy to adjust -Monitor on repeat CBC  Mild hyponatremia-resolved -Continue monitoring  Prior left-sided CVA with residual deficits -Prior tracheostomy from this episode  Prior PE/DVT -Continue anticoagulation with Xarelto, pharmacy to adjust  History of hypertension -Plan to resume home amlodipine and Coreg today  History of dyslipidemia -Continue statin  History of type 2 diabetes-stable -Currently without hyperglycemia -Hold home Lantus and continue SSI -Start home Lantus once diet tolerated well -Hemoglobin A1c 8.9%  DVT prophylaxis: Xarelto Code Status: Full code Family Communication: Discussed with son Kerry [name] on phone 8/14 Disposition Plan:   Status is: Inpatient  Remains inpatient appropriate because:IV treatments appropriate due to intensity of illness or inability to take PO and Inpatient level of care appropriate due to severity of illness   Dispo: The patient is from: Home  Anticipated d/c is to: Home  Anticipated d/c date is: 2 days  Patient currently is not medically stable to d/c.  Patient continues to have significant hypokalemia as well as AKI requiring IV fluid.  She requires ongoing treatment of sepsis with UTI and gram-negative rods.  Consultants:   None  Procedures:   See below  Antimicrobials:  Anti-infectives (From admission, onward)   Start      Dose/Rate Route Frequency Ordered Stop  02/04/20 1200  cefTRIAXone (ROCEPHIN) 2 g in sodium chloride 0.9 % 100 mL IVPB     Discontinue     2 g 200 mL/hr over 30 Minutes Intravenous Every 24 hours 02/03/20 1421     02/03/20 1430  cefTRIAXone (ROCEPHIN) 1 g in sodium chloride 0.9 % 100 mL IVPB        1 g 200 mL/hr over 30 Minutes Intravenous  Once 02/03/20 1422 02/03/20 1541   02/02/20 1215  cefTRIAXone (ROCEPHIN) 1 g in sodium chloride 0.9 % 100 mL IVPB  Status:  Discontinued        1 g 200 mL/hr over 30 Minutes Intravenous Every 24 hours 02/02/20 1204 02/03/20 1421       Subjective: Patient seen and evaluated today with no new acute complaints or concerns.  Noted to have some loose stools overnight.  Persistent hypokalemia noted.  Significant urine output noted.  Objective: Vitals:   02/04/20 0700 02/04/20 0750 02/04/20 0800 02/04/20 0807  BP: (!) 155/63 (!) 155/63 (!) 149/59   Pulse: 86 86 84   Resp: 20 19 (!) 28 (!) 21  Temp:    98.8 F (37.1 C)  TempSrc:    Oral  SpO2: 96% 96% 98%   Weight:      Height:        Intake/Output Summary (Last 24 hours) at 02/04/2020 1059 Last data filed at 02/04/2020 1051 Gross per 24 hour  Intake 2277.34 ml  Output 7950 ml  Net -5672.66 ml   Filed Weights   02/02/20 1600 02/03/20 0200 02/04/20 0445  Weight: 99.8 kg 94.7 kg 95.9 kg    Examination:  General exam: Appears calm and comfortable  Respiratory system: Clear to auscultation. Respiratory effort normal.  Tracheostomy with trach collar Cardiovascular system: S1 & S2 heard, RRR. Gastrointestinal system: Abdomen is minimally distended, obese Central nervous system: Alert and awake Extremities: No edema Skin: No rashes, lesions or ulcers Psychiatry: Judgement and insight appear normal. Mood & affect appropriate.     Data Reviewed: I have personally reviewed following labs and imaging studies  CBC: Recent Labs  Lab 02/02/20 1125 02/03/20 0317 02/04/20 0634  WBC 20.5*  12.7* 10.7*  NEUTROABS 17.4*  --   --   HGB 11.9* 12.7 12.4  HCT 36.1 40.3 38.8  MCV 81.7 85.0 82.9  PLT 94* 77* 86*   Basic Metabolic Panel: Recent Labs  Lab 02/02/20 1125 02/03/20 0317 02/03/20 1537 02/04/20 0634  NA 133* 142  --  140  K <2.0* 2.5* 3.1* 2.5*  CL 96* 110  --  105  CO2 20* 22  --  24  GLUCOSE 113* 147*  --  96  BUN 57* 43*  --  13  CREATININE 4.65* 2.32*  --  0.92  CALCIUM 7.1* 7.4*  --  7.7*  MG  --  2.1  --  1.4*   GFR: Estimated Creatinine Clearance: 72.7 mL/min (by C-G formula based on SCr of 0.92 mg/dL). Liver Function Tests: Recent Labs  Lab 02/02/20 1125 02/03/20 0317  AST 13* 15  ALT 12 14  ALKPHOS 65 78  BILITOT 0.9 0.6  PROT 6.2* 6.7  ALBUMIN 2.6* 2.6*   No results for input(s): LIPASE, AMYLASE in the last 168 hours. No results for input(s): AMMONIA in the last 168 hours. Coagulation Profile: Recent Labs  Lab 02/02/20 1125 02/03/20 0317  INR 1.7* 2.5*   Cardiac Enzymes: No results for input(s): CKTOTAL, CKMB, CKMBINDEX, TROPONINI in the last 168 hours. BNP (  last 3 results) No results for input(s): PROBNP in the last 8760 hours. HbA1C: Recent Labs    02/02/20 1141  HGBA1C 8.9*   CBG: Recent Labs  Lab 02/02/20 2338 02/03/20 0814 02/03/20 1124 02/03/20 1625 02/04/20 0805  GLUCAP 201* 114* 144* 166* 96   Lipid Profile: No results for input(s): CHOL, HDL, LDLCALC, TRIG, CHOLHDL, LDLDIRECT in the last 72 hours. Thyroid Function Tests: No results for input(s): TSH, T4TOTAL, FREET4, T3FREE, THYROIDAB in the last 72 hours. Anemia Panel: No results for input(s): VITAMINB12, FOLATE, FERRITIN, TIBC, IRON, RETICCTPCT in the last 72 hours. Sepsis Labs: Recent Labs  Lab 02/02/20 1125 02/02/20 1348 02/03/20 0317 02/04/20 0634  PROCALCITON  --   --  >150.00  --   LATICACIDVEN 1.8 1.9 1.6 1.0    Recent Results (from the past 240 hour(s))  Culture, blood (Routine x 2)     Status: None (Preliminary result)   Collection Time:  02/02/20 11:41 AM   Specimen: BLOOD  Result Value Ref Range Status   Specimen Description   Final    BLOOD LEFT ANTECUBITAL Performed at [facility], [address], [place], [place] [address]    Special Requests   Final    BOTTLES DRAWN AEROBIC AND ANAEROBIC Blood Culture adequate volume Performed at [facility], [address], [place], [place] [address]    Culture  Setup Time   Final    GRAM NEGATIVE RODS Gram Stain Report Called to,Read Back By and Verified With: HEARN,J AT 0703 ON 02/03/20 BY HUFFINES,S. IN BOTH AEROBIC AND ANAEROBIC BOTTLES Gram Stain Report Called to,Read Back By and Verified With: PREVIOUSLY CALLED @[id number]  08/13/2021KAY CRITICAL RESULT CALLED TO, READ BACK BY AND VERIFIED WITH: PHARMD Alison Stalling [phone] FCP Performed at [facility], [place] [address] [place], [place] [address]    Culture GRAM NEGATIVE RODS  Final   Report Status PENDING  Incomplete  Blood Culture ID Panel (Reflexed)     Status: Abnormal   Collection Time: 02/02/20 11:41 AM  Result Value Ref Range Status   Enterococcus faecalis NOT DETECTED NOT DETECTED Final   Enterococcus Faecium NOT DETECTED NOT DETECTED Final   Listeria monocytogenes NOT DETECTED NOT DETECTED Final   Staphylococcus species NOT DETECTED NOT DETECTED Final   Staphylococcus aureus (BCID) NOT DETECTED NOT DETECTED Final   Staphylococcus epidermidis NOT DETECTED NOT DETECTED Final   Staphylococcus lugdunensis NOT DETECTED NOT DETECTED Final   Streptococcus species NOT DETECTED NOT DETECTED Final   Streptococcus agalactiae NOT DETECTED NOT DETECTED Final   Streptococcus pneumoniae NOT DETECTED NOT DETECTED Final   Streptococcus pyogenes NOT DETECTED NOT DETECTED Final   A.calcoaceticus-baumannii NOT DETECTED NOT DETECTED Final   Bacteroides fragilis NOT DETECTED NOT DETECTED Final   Enterobacterales DETECTED (A) NOT DETECTED Final    Comment: Enterobacterales represent a large order of gram negative bacteria, not a  single organism. CRITICAL RESULT CALLED TO, READ BACK BY AND VERIFIED WITH: PHARMD GARRETT C. [phone] FCP    Enterobacter cloacae complex NOT DETECTED NOT DETECTED Final   Escherichia coli NOT DETECTED NOT DETECTED Final   Klebsiella aerogenes NOT DETECTED NOT DETECTED Final   Klebsiella oxytoca NOT DETECTED NOT DETECTED Final   Klebsiella pneumoniae DETECTED (A) NOT DETECTED Final    Comment: CRITICAL RESULT CALLED TO, READ BACK BY AND VERIFIED WITH: PHARMD GARRETT C. [phone] FCP    Proteus species NOT DETECTED NOT DETECTED Final   Salmonella species NOT DETECTED NOT DETECTED Final  Serratia marcescens NOT DETECTED NOT DETECTED Final   Haemophilus influenzae NOT DETECTED NOT DETECTED Final   Neisseria meningitidis NOT DETECTED NOT DETECTED Final   Pseudomonas aeruginosa NOT DETECTED NOT DETECTED Final   Stenotrophomonas maltophilia NOT DETECTED NOT DETECTED Final   Candida albicans NOT DETECTED NOT DETECTED Final   Candida auris NOT DETECTED NOT DETECTED Final   Candida glabrata NOT DETECTED NOT DETECTED Final   Candida krusei NOT DETECTED NOT DETECTED Final   Candida parapsilosis NOT DETECTED NOT DETECTED Final   Candida tropicalis NOT DETECTED NOT DETECTED Final   Cryptococcus neoformans/gattii NOT DETECTED NOT DETECTED Final   CTX-M ESBL NOT DETECTED NOT DETECTED Final   Carbapenem resistance IMP NOT DETECTED NOT DETECTED Final   Carbapenem resistance KPC NOT DETECTED NOT DETECTED Final   Carbapenem resistance NDM NOT DETECTED NOT DETECTED Final   Carbapenem resist OXA 48 LIKE NOT DETECTED NOT DETECTED Final   Carbapenem resistance VIM NOT DETECTED NOT DETECTED Final    Comment: Performed at [facility], [address], [place], [place] [address]  SARS Coronavirus 2 by RT PCR (hospital order, performed in [facility] hospital lab) Nasopharyngeal Nasopharyngeal Swab     Status: None   Collection Time: 02/02/20 12:02 PM   Specimen: Nasopharyngeal Swab  Result  Value Ref Range Status   SARS Coronavirus 2 NEGATIVE NEGATIVE Final    Comment: (NOTE) SARS-CoV-2 target nucleic acids are NOT DETECTED.  The SARS-CoV-2 RNA is generally detectable in upper and lower respiratory specimens during the acute phase of infection. The lowest concentration of SARS-CoV-2 viral copies this assay can detect is 250 copies / mL. A negative result does not preclude SARS-CoV-2 infection and should not be used as the sole basis for treatment or other patient management decisions.  A negative result may occur with improper specimen collection / handling, submission of specimen other than nasopharyngeal swab, presence of viral mutation(s) within the areas targeted by this assay, and inadequate number of viral copies (<250 copies / mL). A negative result must be combined with clinical observations, patient history, and epidemiological information.  Fact Sheet for Patients:   StrictlyIdeas.no  Fact Sheet for Healthcare Providers: BankingDealers.co.za  This test is not yet approved or  cleared by the [place] FDA and has been authorized for detection and/or diagnosis of SARS-CoV-2 by FDA under an Emergency Use Authorization (EUA).  This EUA will remain in effect (meaning this test can be used) for the duration of the COVID-19 declaration under Section 564(b)(1) of the Act, 21 U.S.C. section 360bbb-3(b)(1), unless the authorization is terminated or revoked sooner.  Performed at [facility], [address] [place], [place] [address]   Urine culture     Status: Abnormal   Collection Time: 02/02/20 12:04 PM   Specimen: In/Out Cath Urine  Result Value Ref Range Status   Specimen Description   Final    IN/OUT CATH URINE Performed at [facility], [address], [place], [place] [address]    Special Requests   Final    NONE Performed at [facility], [address], [place], [place] [address]    Culture (A)  Final     >=100,000 COLONIES/mL KLEBSIELLA PNEUMONIAE >=100,000 COLONIES/mL PROTEUS MIRABILIS    Report Status 02/04/2020 FINAL  Final   Organism ID, Bacteria KLEBSIELLA PNEUMONIAE (A)  Final   Organism ID, Bacteria PROTEUS MIRABILIS (A)  Final      Susceptibility   Klebsiella pneumoniae - MIC*    AMPICILLIN RESISTANT Resistant  CEFAZOLIN <=4 SENSITIVE Sensitive     CEFTRIAXONE <=0.25 SENSITIVE Sensitive     CIPROFLOXACIN <=0.25 SENSITIVE Sensitive     GENTAMICIN <=1 SENSITIVE Sensitive     IMIPENEM <=0.25 SENSITIVE Sensitive     NITROFURANTOIN 64 INTERMEDIATE Intermediate     TRIMETH/SULFA >=320 RESISTANT Resistant     AMPICILLIN/SULBACTAM 4 SENSITIVE Sensitive     PIP/TAZO <=4 SENSITIVE Sensitive     * >=100,000 COLONIES/mL KLEBSIELLA PNEUMONIAE   Proteus mirabilis - MIC*    AMPICILLIN <=2 SENSITIVE Sensitive     CEFAZOLIN 8 SENSITIVE Sensitive     CEFTRIAXONE <=0.25 SENSITIVE Sensitive     CIPROFLOXACIN <=0.25 SENSITIVE Sensitive     GENTAMICIN <=1 SENSITIVE Sensitive     IMIPENEM 4 SENSITIVE Sensitive     NITROFURANTOIN 128 RESISTANT Resistant     TRIMETH/SULFA <=20 SENSITIVE Sensitive     AMPICILLIN/SULBACTAM <=2 SENSITIVE Sensitive     PIP/TAZO <=4 SENSITIVE Sensitive     * >=100,000 COLONIES/mL PROTEUS MIRABILIS  Culture, blood (Routine x 2)     Status: None (Preliminary result)   Collection Time: 02/02/20 12:34 PM   Specimen: BLOOD  Result Value Ref Range Status   Specimen Description BLOOD RIGHT HAND  Final   Special Requests   Final    BOTTLES DRAWN AEROBIC AND ANAEROBIC Blood Culture adequate volume   Culture   Final    NO GROWTH 2 DAYS Performed at [facility], [address], [place], [place] [address]    Report Status PENDING  Incomplete  MRSA PCR Screening     Status: None   Collection Time: 02/03/20  1:41 AM   Specimen: Nasal Mucosa; Nasopharyngeal  Result Value Ref Range Status   MRSA by PCR NEGATIVE NEGATIVE Final    Comment:        The GeneXpert MRSA  Assay (FDA approved for NASAL specimens only), is one component of a comprehensive MRSA colonization surveillance program. It is not intended to diagnose MRSA infection nor to guide or monitor treatment for MRSA infections. Performed at [facility], [address] [place], [place] [address]          Radiology Studies: [facility] Chest [facility] 1 View  Result Date: 02/02/2020 CLINICAL DATA:  Altered mental status EXAM: PORTABLE CHEST 1 VIEW COMPARISON:  2019 FINDINGS: Tracheostomy device is present. Patient is rotated. Stable chronic interstitial prominence. No significant pleural effusion. No pneumothorax. Similar cardiomediastinal contours with probable mild cardiomegaly. IMPRESSION: No acute process in the chest. Electronically Signed   By: Macy Mis M.D.   On: 02/02/2020 12:43        Scheduled Meds: . atorvastatin  40 mg Oral q1800  . Chlorhexidine Gluconate Cloth  6 each Topical Daily  . citalopram  20 mg Oral Daily  . insulin aspart  0-5 Units Subcutaneous QHS  . insulin aspart  0-9 Units Subcutaneous TID WC  . potassium chloride  40 mEq Oral TID  . rivaroxaban  10 mg Oral Q supper   Continuous Infusions: . sodium chloride 75 mL/hr at 02/04/20 0857  . cefTRIAXone (ROCEPHIN)  IV 2 g (02/04/20 1056)  . potassium chloride 10 mEq (02/04/20 1056)     LOS: 2 days    Time spent: 35 minutes    Shaheen Star D Manuella Ghazi, DO [facility]  If 7PM-7AM, please contact night-coverage [email or web address] 02/04/2020, 10:59 AM

## 2020-02-04 NOTE — Plan of Care (Signed)
CRITICAL VALUE ALERT  Critical Value:  K+ 2.5  Date & Time Notied:  02/04/20 0746  Provider Notified: Dr. Manuella Ghazi  Orders Received/Actions taken: See new orders

## 2020-02-05 ENCOUNTER — Inpatient Hospital Stay (HOSPITAL_COMMUNITY): Payer: 59

## 2020-02-05 LAB — CBC
HCT: 36.2 % (ref 36.0–46.0)
Hemoglobin: 11.4 g/dL — ABNORMAL LOW (ref 12.0–15.0)
MCH: 26.3 pg (ref 26.0–34.0)
MCHC: 31.5 g/dL (ref 30.0–36.0)
MCV: 83.6 fL (ref 80.0–100.0)
Platelets: 89 10*3/uL — ABNORMAL LOW (ref 150–400)
RBC: 4.33 MIL/uL (ref 3.87–5.11)
RDW: 14.4 % (ref 11.5–15.5)
WBC: 8.4 10*3/uL (ref 4.0–10.5)
nRBC: 0 % (ref 0.0–0.2)

## 2020-02-05 LAB — CULTURE, BLOOD (ROUTINE X 2): Special Requests: ADEQUATE

## 2020-02-05 LAB — BASIC METABOLIC PANEL
Anion gap: 8 (ref 5–15)
BUN: 8 mg/dL (ref 8–23)
CO2: 25 mmol/L (ref 22–32)
Calcium: 7.7 mg/dL — ABNORMAL LOW (ref 8.9–10.3)
Chloride: 103 mmol/L (ref 98–111)
Creatinine, Ser: 0.73 mg/dL (ref 0.44–1.00)
GFR calc Af Amer: >60 mL/min (ref >60)
GFR calc non Af Amer: >60 mL/min (ref >60)
Glucose, Bld: 166 mg/dL — ABNORMAL HIGH (ref 70–99)
Potassium: 3.4 mmol/L — ABNORMAL LOW (ref 3.5–5.1)
Sodium: 136 mmol/L (ref 135–145)

## 2020-02-05 LAB — GLUCOSE, CAPILLARY
Glucose-Capillary: 154 mg/dL — ABNORMAL HIGH (ref 70–99)
Glucose-Capillary: 242 mg/dL — ABNORMAL HIGH (ref 70–99)
Glucose-Capillary: 163 mg/dL — ABNORMAL HIGH (ref 70–99)
Glucose-Capillary: 202 mg/dL — ABNORMAL HIGH (ref 70–99)

## 2020-02-05 LAB — C DIFFICILE (CDIFF) QUICK SCRN (NO PCR REFLEX)
C Diff antigen: NEGATIVE
C Diff interpretation: NOT DETECTED
C Diff toxin: NEGATIVE

## 2020-02-05 LAB — MAGNESIUM: Magnesium: 1.6 mg/dL — ABNORMAL LOW (ref 1.7–2.4)

## 2020-02-05 MED ORDER — POTASSIUM CHLORIDE CRYS ER 20 MEQ PO TBCR
40.0000 meq | EXTENDED_RELEASE_TABLET | Freq: Once | ORAL | Status: AC
  Administered 2020-02-05: 40 meq via ORAL
  Filled 2020-02-05: qty 2

## 2020-02-05 MED ORDER — POTASSIUM CHLORIDE 10 MEQ/100ML IV SOLN
10.0000 meq | INTRAVENOUS | Status: AC
  Administered 2020-02-05 (×4): 10 meq via INTRAVENOUS
  Filled 2020-02-05 (×4): qty 100

## 2020-02-05 MED ORDER — IOHEXOL 300 MG/ML  SOLN
100.0000 mL | Freq: Once | INTRAMUSCULAR | Status: AC | PRN
  Administered 2020-02-05: 100 mL via INTRAVENOUS

## 2020-02-05 MED ORDER — MAGNESIUM SULFATE 2 GM/50ML IV SOLN
2.0000 g | Freq: Once | INTRAVENOUS | Status: AC
  Administered 2020-02-05: 2 g via INTRAVENOUS
  Filled 2020-02-05: qty 50

## 2020-02-05 NOTE — Progress Notes (Signed)
PROGRESS NOTE    Kerry SHACKETT  AJO:[id number] DOB: 20-Jul-1955 DOA: 02/02/2020 PCP: Blair Heys, PA-C   Brief Narrative:  Per HPI: Kerry Sprecher Pattersonis a 64 y.o.femalewith medical history significant forleft-sided CVA with residual right-sided deficits, prior PE/DVT, CAD, hypertension, hyperlipidemia, and type 2 diabeteswho presented to the ED via EMS with altered mentation. Apparently she was with her husband at home who noticed that she suddenly collapsed and became quite confused. Her son came to the house to evaluate her and she was noted to be somewhat diaphoretic and had called EMS. Patient claims that she was having some burning with urination over the last couple days and did have some diarrhea as well.  -Patient was admitted with severe sepsis secondary to UTI along with associated AKI and severe hypokalemia in the setting of chronic diarrhea.  8/13: Sepsis physiology is resolving this morning with treatment of UTI. She is noted to have gram-negative rods on anaerobic bottle. AKI is also resolving with aggressive IV fluid. Potassium levels are improving as well. Continue IV fluid resuscitation as well as IV antibiotics and advance diet today supplement further potassium and transfer to telemetry if stable.  8/14: Patient continues to have persistent hypokalemia, but kidney function is improved.  Sepsis physiology appears to be resolving.  Noted to have Klebsiella and Proteus UTI with associated bacteremia.  Appears to have some ongoing loose stools.  8/15: Hypokalemia slowly improving. She is still having liquid stools and Flexi-Seal has been placed. KUB concerning for volvulus with CT abdomen pending.  Assessment & Plan:   Active Problems:   Severe sepsis (HCC)   Severe sepsis secondary to Klebsiella and Proteus UTI with associated bacteremia -improving -Continue Rocephin 2 g IV daily -Continue IV fluid -Monitor a.m. CBC  Abdominal distention with diarrhea  with potential volvulus -CT abdomen ordered and pending -Cdiff negative -GI panel pending -Imodium prn  Severe AKI likely secondary to sepsis -resolved -Decrease IV fluid rate -Increased urine output noted -Monitor strict I's and O's -Repeat a.m. labs  Severe hypokalemia-improving -Likely related to recent increase in liquid bowel movements -Imodium as needed -Replete and monitor on telemetry -Reevaluate in a.m. -Continue ongoing repletion with IV and p.o. today  Hypomagnesemia -Replete and reevaluate in a.m.  Thrombocytopenia-stable -Likely secondary to sepsis physiology -Continue on Xarelto, pharmacy to adjust -Monitor on repeat CBC  Mild hyponatremia-resolved -Continue monitoring  Prior left-sided CVA with residual deficits -Prior tracheostomy from this episode  Prior PE/DVT -Continue anticoagulation with Xarelto, pharmacy to adjust  History of hypertension -Plan to resume home amlodipine and Coreg today  History of dyslipidemia -Continue statin  History of type 2 diabetes-stable -Currently without hyperglycemia -Hold home Lantus andcontinueSSI -Start home Lantus once diet tolerated well -Hemoglobin A1c 8.9%  DVT prophylaxis:Xarelto Code Status:Full code Family Communication:Discussed with son [name] on phone 8/15 Disposition Plan:  Status is: Inpatient  Remains inpatient appropriate because:IV treatments appropriate due to intensity of illness or inability to take PO and Inpatient level of care appropriate due to severity of illness   Dispo: The patient is from:Home Anticipated d/c is NO:BSJG Anticipated d/c date is: 2 days Patient currently is not medically stable to d/c.Patient continues to have significant hypokalemia as well as AKI requiring IV fluid. She requires ongoing treatment of sepsis with UTI and gram-negative rods.  Consultants:  None  Procedures:  See  below  Antimicrobials:  Anti-infectives (From admission, onward)   Start     Dose/Rate Route Frequency Ordered Stop   02/04/20 1200  cefTRIAXone (ROCEPHIN) 2 g in sodium chloride 0.9 % 100 mL IVPB     Discontinue     2 g 200 mL/hr over 30 Minutes Intravenous Every 24 hours 02/03/20 1421     02/03/20 1430  cefTRIAXone (ROCEPHIN) 1 g in sodium chloride 0.9 % 100 mL IVPB        1 g 200 mL/hr over 30 Minutes Intravenous  Once 02/03/20 1422 02/03/20 1541   02/02/20 1215  cefTRIAXone (ROCEPHIN) 1 g in sodium chloride 0.9 % 100 mL IVPB  Status:  Discontinued        1 g 200 mL/hr over 30 Minutes Intravenous Every 24 hours 02/02/20 1204 02/03/20 1421       Subjective: Patient seen and evaluated today with no new acute complaints or concerns. No acute concerns or events noted overnight. She continues to have loose, liquidy stools. Tolerating diet otherwise. No nausea or vomiting noted.  Objective: Vitals:   02/05/20 0725 02/05/20 0750 02/05/20 1127 02/05/20 1159  BP:  133/62    Pulse: 83 93  83  Resp: (!) 24 (!) 25 (!) 23 (!) 23  Temp: 97.7 F (36.5 C)  98.2 F (36.8 C)   TempSrc: Oral  Oral   SpO2: 98% 96%  100%  Weight:      Height:        Intake/Output Summary (Last 24 hours) at 02/05/2020 1454 Last data filed at 02/05/2020 1306 Gross per 24 hour  Intake 2941.73 ml  Output 8650 ml  Net -5708.27 ml   Filed Weights   02/02/20 1600 02/03/20 0200 02/04/20 0445  Weight: 99.8 kg 94.7 kg 95.9 kg    Examination:  General exam: Appears calm and comfortable, obese Respiratory system: Clear to auscultation. Respiratory effort normal. Prior tracheostomy with trach collar Cardiovascular system: S1 & S2 heard, RRR.  Gastrointestinal system: Abdomen is soft and minimally distended. Central nervous system: Alert and awake Extremities: No edema Skin: No rashes, lesions or ulcers Psychiatry: Judgement and insight appear normal. Mood & affect appropriate.     Data Reviewed: I have  personally reviewed following labs and imaging studies  CBC: Recent Labs  Lab 02/02/20 1125 02/03/20 0317 02/04/20 0634 02/05/20 0556  WBC 20.5* 12.7* 10.7* 8.4  NEUTROABS 17.4*  --   --   --   HGB 11.9* 12.7 12.4 11.4*  HCT 36.1 40.3 38.8 36.2  MCV 81.7 85.0 82.9 83.6  PLT 94* 77* 86* 89*   Basic Metabolic Panel: Recent Labs  Lab 02/02/20 1125 02/02/20 1125 02/03/20 0317 02/03/20 1537 02/04/20 0634 02/04/20 1632 02/05/20 0556  NA 133*  --  142  --  140  --  136  K <2.0*   < > 2.5* 3.1* 2.5* 2.8* 3.4*  CL 96*  --  110  --  105  --  103  CO2 20*  --  22  --  24  --  25  GLUCOSE 113*  --  147*  --  96  --  166*  BUN 57*  --  43*  --  13  --  8  CREATININE 4.65*  --  2.32*  --  0.92  --  0.73  CALCIUM 7.1*  --  7.4*  --  7.7*  --  7.7*  MG  --   --  2.1  --  1.4*  --  1.6*   < > = values in this interval not displayed.   GFR: Estimated Creatinine Clearance: 83.6 mL/min (by C-G formula based  on SCr of 0.73 mg/dL). Liver Function Tests: Recent Labs  Lab 02/02/20 1125 02/03/20 0317  AST 13* 15  ALT 12 14  ALKPHOS 65 78  BILITOT 0.9 0.6  PROT 6.2* 6.7  ALBUMIN 2.6* 2.6*   No results for input(s): LIPASE, AMYLASE in the last 168 hours. No results for input(s): AMMONIA in the last 168 hours. Coagulation Profile: Recent Labs  Lab 02/02/20 1125 02/03/20 0317  INR 1.7* 2.5*   Cardiac Enzymes: No results for input(s): CKTOTAL, CKMB, CKMBINDEX, TROPONINI in the last 168 hours. BNP (last 3 results) No results for input(s): PROBNP in the last 8760 hours. HbA1C: No results for input(s): HGBA1C in the last 72 hours. CBG: Recent Labs  Lab 02/04/20 1134 02/04/20 1636 02/04/20 2155 02/05/20 0723 02/05/20 1125  GLUCAP 173* 159* 135* 154* 242*   Lipid Profile: No results for input(s): CHOL, HDL, LDLCALC, TRIG, CHOLHDL, LDLDIRECT in the last 72 hours. Thyroid Function Tests: No results for input(s): TSH, T4TOTAL, FREET4, T3FREE, THYROIDAB in the last 72  hours. Anemia Panel: No results for input(s): VITAMINB12, FOLATE, FERRITIN, TIBC, IRON, RETICCTPCT in the last 72 hours. Sepsis Labs: Recent Labs  Lab 02/02/20 1125 02/02/20 1348 02/03/20 0317 02/04/20 0634  PROCALCITON  --   --  >150.00  --   LATICACIDVEN 1.8 1.9 1.6 1.0    Recent Results (from the past 240 hour(s))  Culture, blood (Routine x 2)     Status: Abnormal   Collection Time: 02/02/20 11:41 AM   Specimen: BLOOD  Result Value Ref Range Status   Specimen Description   Final    BLOOD LEFT ANTECUBITAL Performed at [facility], [address], [place], [place] [address]    Special Requests   Final    BOTTLES DRAWN AEROBIC AND ANAEROBIC Blood Culture adequate volume Performed at [facility], [address], [place], [place] [address]    Culture  Setup Time   Final    GRAM NEGATIVE RODS Gram Stain Report Called to,Read Back By and Verified With: HEARN,J AT 0703 ON 02/03/20 BY HUFFINES,S. IN BOTH AEROBIC AND ANAEROBIC BOTTLES Gram Stain Report Called to,Read Back By and Verified With: PREVIOUSLY CALLED @[id number]  08/13/2021KAY CRITICAL RESULT CALLED TO, READ BACK BY AND VERIFIED WITH: PHARMD Alison Stalling 1402 [id number] FCP Performed at [facility], [place] [address] [place], [place] [address]    Culture KLEBSIELLA PNEUMONIAE PROTEUS MIRABILIS  (A)  Final   Report Status 02/05/2020 FINAL  Final   Organism ID, Bacteria KLEBSIELLA PNEUMONIAE  Final   Organism ID, Bacteria PROTEUS MIRABILIS  Final      Susceptibility   Klebsiella pneumoniae - MIC*    AMPICILLIN RESISTANT Resistant     CEFAZOLIN <=4 SENSITIVE Sensitive     CEFEPIME <=0.12 SENSITIVE Sensitive     CEFTAZIDIME <=1 SENSITIVE Sensitive     CEFTRIAXONE <=0.25 SENSITIVE Sensitive     CIPROFLOXACIN <=0.25 SENSITIVE Sensitive     GENTAMICIN <=1 SENSITIVE Sensitive     IMIPENEM <=0.25 SENSITIVE Sensitive     TRIMETH/SULFA >=320 RESISTANT Resistant     AMPICILLIN/SULBACTAM 4 SENSITIVE Sensitive     PIP/TAZO <=4  SENSITIVE Sensitive     * KLEBSIELLA PNEUMONIAE   Proteus mirabilis - MIC*    AMPICILLIN <=2 SENSITIVE Sensitive     CEFAZOLIN 8 SENSITIVE Sensitive     CEFEPIME <=0.12 SENSITIVE Sensitive     CEFTAZIDIME <=1 SENSITIVE Sensitive     CEFTRIAXONE <=0.25 SENSITIVE Sensitive     CIPROFLOXACIN <=0.25 SENSITIVE Sensitive  GENTAMICIN <=1 SENSITIVE Sensitive     IMIPENEM 4 SENSITIVE Sensitive     TRIMETH/SULFA <=20 SENSITIVE Sensitive     AMPICILLIN/SULBACTAM <=2 SENSITIVE Sensitive     PIP/TAZO <=4 SENSITIVE Sensitive     * PROTEUS MIRABILIS  Blood Culture ID Panel (Reflexed)     Status: Abnormal   Collection Time: 02/02/20 11:41 AM  Result Value Ref Range Status   Enterococcus faecalis NOT DETECTED NOT DETECTED Final   Enterococcus Faecium NOT DETECTED NOT DETECTED Final   Listeria monocytogenes NOT DETECTED NOT DETECTED Final   Staphylococcus species NOT DETECTED NOT DETECTED Final   Staphylococcus aureus (BCID) NOT DETECTED NOT DETECTED Final   Staphylococcus epidermidis NOT DETECTED NOT DETECTED Final   Staphylococcus lugdunensis NOT DETECTED NOT DETECTED Final   Streptococcus species NOT DETECTED NOT DETECTED Final   Streptococcus agalactiae NOT DETECTED NOT DETECTED Final   Streptococcus pneumoniae NOT DETECTED NOT DETECTED Final   Streptococcus pyogenes NOT DETECTED NOT DETECTED Final   A.calcoaceticus-baumannii NOT DETECTED NOT DETECTED Final   Bacteroides fragilis NOT DETECTED NOT DETECTED Final   Enterobacterales DETECTED (A) NOT DETECTED Final    Comment: Enterobacterales represent a large order of gram negative bacteria, not a single organism. CRITICAL RESULT CALLED TO, READ BACK BY AND VERIFIED WITH: PHARMD GARRETT C. 1402 [id number] FCP    Enterobacter cloacae complex NOT DETECTED NOT DETECTED Final   Escherichia coli NOT DETECTED NOT DETECTED Final   Klebsiella aerogenes NOT DETECTED NOT DETECTED Final   Klebsiella oxytoca NOT DETECTED NOT DETECTED Final   Klebsiella  pneumoniae DETECTED (A) NOT DETECTED Final    Comment: CRITICAL RESULT CALLED TO, READ BACK BY AND VERIFIED WITH: PHARMD GARRETT C. 1402 [id number] FCP    Proteus species NOT DETECTED NOT DETECTED Final   Salmonella species NOT DETECTED NOT DETECTED Final   Serratia marcescens NOT DETECTED NOT DETECTED Final   Haemophilus influenzae NOT DETECTED NOT DETECTED Final   Neisseria meningitidis NOT DETECTED NOT DETECTED Final   Pseudomonas aeruginosa NOT DETECTED NOT DETECTED Final   Stenotrophomonas maltophilia NOT DETECTED NOT DETECTED Final   Candida albicans NOT DETECTED NOT DETECTED Final   Candida auris NOT DETECTED NOT DETECTED Final   Candida glabrata NOT DETECTED NOT DETECTED Final   Candida krusei NOT DETECTED NOT DETECTED Final   Candida parapsilosis NOT DETECTED NOT DETECTED Final   Candida tropicalis NOT DETECTED NOT DETECTED Final   Cryptococcus neoformans/gattii NOT DETECTED NOT DETECTED Final   CTX-M ESBL NOT DETECTED NOT DETECTED Final   Carbapenem resistance IMP NOT DETECTED NOT DETECTED Final   Carbapenem resistance KPC NOT DETECTED NOT DETECTED Final   Carbapenem resistance NDM NOT DETECTED NOT DETECTED Final   Carbapenem resist OXA 48 LIKE NOT DETECTED NOT DETECTED Final   Carbapenem resistance VIM NOT DETECTED NOT DETECTED Final    Comment: Performed at [facility], [address] [place], [place] [address]  SARS Coronavirus 2 by RT PCR (hospital order, performed in [facility] hospital lab) Nasopharyngeal Nasopharyngeal Swab     Status: None   Collection Time: 02/02/20 12:02 PM   Specimen: Nasopharyngeal Swab  Result Value Ref Range Status   SARS Coronavirus 2 NEGATIVE NEGATIVE Final    Comment: (NOTE) SARS-CoV-2 target nucleic acids are NOT DETECTED.  The SARS-CoV-2 RNA is generally detectable in upper and lower respiratory specimens during the acute phase of infection. The lowest concentration of SARS-CoV-2 viral copies this assay can detect is 250 copies /  mL. A negative result  does not preclude SARS-CoV-2 infection and should not be used as the sole basis for treatment or other patient management decisions.  A negative result may occur with improper specimen collection / handling, submission of specimen other than nasopharyngeal swab, presence of viral mutation(s) within the areas targeted by this assay, and inadequate number of viral copies (<250 copies / mL). A negative result must be combined with clinical observations, patient history, and epidemiological information.  Fact Sheet for Patients:   StrictlyIdeas.no  Fact Sheet for Healthcare Providers: BankingDealers.co.za  This test is not yet approved or  cleared by the [place] FDA and has been authorized for detection and/or diagnosis of SARS-CoV-2 by FDA under an Emergency Use Authorization (EUA).  This EUA will remain in effect (meaning this test can be used) for the duration of the COVID-19 declaration under Section 564(b)(1) of the Act, 21 U.S.C. section 360bbb-3(b)(1), unless the authorization is terminated or revoked sooner.  Performed at [facility], [address] [place], [place] [address]   Urine culture     Status: Abnormal   Collection Time: 02/02/20 12:04 PM   Specimen: In/Out Cath Urine  Result Value Ref Range Status   Specimen Description   Final    IN/OUT CATH URINE Performed at [facility], [address], [place], [place] [address]    Special Requests   Final    NONE Performed at [facility], [address], [place], [place] [address]    Culture (A)  Final    >=100,000 COLONIES/mL KLEBSIELLA PNEUMONIAE >=100,000 COLONIES/mL PROTEUS MIRABILIS    Report Status 02/04/2020 FINAL  Final   Organism ID, Bacteria KLEBSIELLA PNEUMONIAE (A)  Final   Organism ID, Bacteria PROTEUS MIRABILIS (A)  Final      Susceptibility   Klebsiella pneumoniae - MIC*    AMPICILLIN RESISTANT Resistant     CEFAZOLIN <=4  SENSITIVE Sensitive     CEFTRIAXONE <=0.25 SENSITIVE Sensitive     CIPROFLOXACIN <=0.25 SENSITIVE Sensitive     GENTAMICIN <=1 SENSITIVE Sensitive     IMIPENEM <=0.25 SENSITIVE Sensitive     NITROFURANTOIN 64 INTERMEDIATE Intermediate     TRIMETH/SULFA >=320 RESISTANT Resistant     AMPICILLIN/SULBACTAM 4 SENSITIVE Sensitive     PIP/TAZO <=4 SENSITIVE Sensitive     * >=100,000 COLONIES/mL KLEBSIELLA PNEUMONIAE   Proteus mirabilis - MIC*    AMPICILLIN <=2 SENSITIVE Sensitive     CEFAZOLIN 8 SENSITIVE Sensitive     CEFTRIAXONE <=0.25 SENSITIVE Sensitive     CIPROFLOXACIN <=0.25 SENSITIVE Sensitive     GENTAMICIN <=1 SENSITIVE Sensitive     IMIPENEM 4 SENSITIVE Sensitive     NITROFURANTOIN 128 RESISTANT Resistant     TRIMETH/SULFA <=20 SENSITIVE Sensitive     AMPICILLIN/SULBACTAM <=2 SENSITIVE Sensitive     PIP/TAZO <=4 SENSITIVE Sensitive     * >=100,000 COLONIES/mL PROTEUS MIRABILIS  Culture, blood (Routine x 2)     Status: None (Preliminary result)   Collection Time: 02/02/20 12:34 PM   Specimen: BLOOD  Result Value Ref Range Status   Specimen Description BLOOD RIGHT HAND  Final   Special Requests   Final    BOTTLES DRAWN AEROBIC AND ANAEROBIC Blood Culture adequate volume   Culture   Final    NO GROWTH 2 DAYS Performed at [facility], [address] [place], [place] [address]    Report Status PENDING  Incomplete  MRSA PCR Screening     Status: None   Collection Time: 02/03/20  1:41 AM  Specimen: Nasal Mucosa; Nasopharyngeal  Result Value Ref Range Status   MRSA by PCR NEGATIVE NEGATIVE Final    Comment:        The GeneXpert MRSA Assay (FDA approved for NASAL specimens only), is one component of a comprehensive MRSA colonization surveillance program. It is not intended to diagnose MRSA infection nor to guide or monitor treatment for MRSA infections. Performed at [facility], [address], [place], [place] [address]   C Difficile Quick Screen (NO PCR Reflex)      Status: None   Collection Time: 02/05/20  9:32 AM   Specimen: STOOL  Result Value Ref Range Status   C Diff antigen NEGATIVE NEGATIVE Final   C Diff toxin NEGATIVE NEGATIVE Final   C Diff interpretation No C. difficile detected.  Final    Comment: Performed at [facility], [address] [place], [place] [address]         Radiology Studies: DG Abd 1 View  Result Date: 02/05/2020 CLINICAL DATA:  Gaseous abdominal distention. EXAM: ABDOMEN - 1 VIEW COMPARISON:  CT abdomen pelvis 09/19/2017. FINDINGS: Gaseous distended loops of small large bowel are demonstrated throughout the abdomen. There appears to be marked colonic distension. Supine evaluation limited for the detection of free intraperitoneal air. Thoracic spine degenerative changes. IMPRESSION: Marked colonic distension. Given the appearance on abdominal radiograph, recommend further evaluation with CT to exclude the possibility of a volvulus or other obstructing cause. These results were called by telephone at the time of interpretation on 02/05/2020 at 9:49 am to provider [facility] , who verbally acknowledged these results. Electronically Signed   By: Lovey Newcomer M.D.   On: 02/05/2020 09:49        Scheduled Meds: . amLODipine  10 mg Oral Daily  . atorvastatin  40 mg Oral q1800  . carvedilol  12.5 mg Oral BID WC  . Chlorhexidine Gluconate Cloth  6 each Topical Daily  . citalopram  20 mg Oral Daily  . insulin aspart  0-5 Units Subcutaneous QHS  . insulin aspart  0-9 Units Subcutaneous TID WC  . rivaroxaban  20 mg Oral Q supper   Continuous Infusions: . sodium chloride 75 mL/hr at 02/05/20 0357  . cefTRIAXone (ROCEPHIN)  IV 2 g (02/05/20 1145)     LOS: 3 days    Time spent: 40 minutes    Corleone Biegler Darleen Crocker, DO [facility]  If 7PM-7AM, please contact night-coverage [email or web address] 02/05/2020, 2:54 PM

## 2020-02-06 ENCOUNTER — Encounter (HOSPITAL_COMMUNITY)
Admission: EM | Disposition: A | Discharge status: Home / Self Care | Payer: Self-pay | Source: Home / Self Care | Attending: Internal Medicine

## 2020-02-06 ENCOUNTER — Inpatient Hospital Stay (HOSPITAL_COMMUNITY): Payer: 59 | Admitting: Certified Registered Nurse Anesthetist

## 2020-02-06 ENCOUNTER — Inpatient Hospital Stay (HOSPITAL_COMMUNITY): Payer: 59

## 2020-02-06 ENCOUNTER — Inpatient Hospital Stay (HOSPITAL_COMMUNITY): Payer: 59 | Admitting: Anesthesiology

## 2020-02-06 ENCOUNTER — Encounter (HOSPITAL_COMMUNITY): Payer: Self-pay | Admitting: Internal Medicine

## 2020-02-06 DIAGNOSIS — N390 Urinary tract infection, site not specified: Secondary | ICD-10-CM

## 2020-02-06 DIAGNOSIS — A419 Sepsis, unspecified organism: Secondary | ICD-10-CM

## 2020-02-06 DIAGNOSIS — R197 Diarrhea, unspecified: Secondary | ICD-10-CM | POA: Diagnosis not present

## 2020-02-06 DIAGNOSIS — N132 Hydronephrosis with renal and ureteral calculous obstruction: Secondary | ICD-10-CM

## 2020-02-06 HISTORY — PX: CYSTOSCOPY W/ URETERAL STENT PLACEMENT: SHX1429

## 2020-02-06 LAB — GLUCOSE, CAPILLARY
Glucose-Capillary: 221 mg/dL — ABNORMAL HIGH (ref 70–99)
Glucose-Capillary: 152 mg/dL — ABNORMAL HIGH (ref 70–99)
Glucose-Capillary: 153 mg/dL — ABNORMAL HIGH (ref 70–99)
Glucose-Capillary: 161 mg/dL — ABNORMAL HIGH (ref 70–99)
Glucose-Capillary: 161 mg/dL — ABNORMAL HIGH (ref 70–99)
Glucose-Capillary: 179 mg/dL — ABNORMAL HIGH (ref 70–99)

## 2020-02-06 LAB — CBC
HCT: 36.5 % (ref 36.0–46.0)
Hemoglobin: 11.5 g/dL — ABNORMAL LOW (ref 12.0–15.0)
MCH: 26.4 pg (ref 26.0–34.0)
MCHC: 31.5 g/dL (ref 30.0–36.0)
MCV: 83.7 fL (ref 80.0–100.0)
Platelets: 102 10*3/uL — ABNORMAL LOW (ref 150–400)
RBC: 4.36 MIL/uL (ref 3.87–5.11)
RDW: 14.1 % (ref 11.5–15.5)
WBC: 7.7 10*3/uL (ref 4.0–10.5)
nRBC: 0 % (ref 0.0–0.2)

## 2020-02-06 LAB — BASIC METABOLIC PANEL
Anion gap: 8 (ref 5–15)
BUN: 8 mg/dL (ref 8–23)
CO2: 28 mmol/L (ref 22–32)
Calcium: 8.1 mg/dL — ABNORMAL LOW (ref 8.9–10.3)
Chloride: 99 mmol/L (ref 98–111)
Creatinine, Ser: 0.62 mg/dL (ref 0.44–1.00)
GFR calc Af Amer: >60 mL/min (ref >60)
GFR calc non Af Amer: >60 mL/min (ref >60)
Glucose, Bld: 240 mg/dL — ABNORMAL HIGH (ref 70–99)
Potassium: 3.3 mmol/L — ABNORMAL LOW (ref 3.5–5.1)
Sodium: 135 mmol/L (ref 135–145)

## 2020-02-06 LAB — TSH: TSH: 1.46 u[IU]/mL (ref 0.350–4.500)

## 2020-02-06 LAB — MAGNESIUM: Magnesium: 1.4 mg/dL — ABNORMAL LOW (ref 1.7–2.4)

## 2020-02-06 SURGERY — CYSTOSCOPY, WITH RETROGRADE PYELOGRAM AND URETERAL STENT INSERTION
Anesthesia: General | Laterality: Right

## 2020-02-06 MED ORDER — PROPOFOL 10 MG/ML IV BOLUS
INTRAVENOUS | Status: AC
  Filled 2020-02-06: qty 20

## 2020-02-06 MED ORDER — PHENYLEPHRINE 40 MCG/ML (10ML) SYRINGE FOR IV PUSH (FOR BLOOD PRESSURE SUPPORT)
PREFILLED_SYRINGE | INTRAVENOUS | Status: DC | PRN
  Administered 2020-02-06: 80 ug via INTRAVENOUS

## 2020-02-06 MED ORDER — FENTANYL CITRATE (PF) 100 MCG/2ML IJ SOLN
INTRAMUSCULAR | Status: AC
  Filled 2020-02-06: qty 2

## 2020-02-06 MED ORDER — LACTATED RINGERS IV SOLN: Freq: Once | INTRAVENOUS | Status: AC

## 2020-02-06 MED ORDER — MEPERIDINE HCL 50 MG/ML IJ SOLN: 6.2500 mg | INTRAMUSCULAR | Status: DC | PRN

## 2020-02-06 MED ORDER — HYDROMORPHONE HCL 1 MG/ML IJ SOLN: 0.2500 mg | INTRAMUSCULAR | Status: DC | PRN

## 2020-02-06 MED ORDER — SODIUM CHLORIDE 0.9 % IR SOLN
Status: DC | PRN
  Administered 2020-02-06: 3000 mL

## 2020-02-06 MED ORDER — ORAL CARE MOUTH RINSE: 15.0000 mL | Freq: Once | OROMUCOSAL | Status: AC

## 2020-02-06 MED ORDER — FENTANYL CITRATE (PF) 100 MCG/2ML IJ SOLN
INTRAMUSCULAR | Status: DC | PRN
  Administered 2020-02-06: 25 ug via INTRAVENOUS

## 2020-02-06 MED ORDER — ONDANSETRON HCL 4 MG/2ML IJ SOLN
INTRAMUSCULAR | Status: DC | PRN
  Administered 2020-02-06: 4 mg via INTRAVENOUS

## 2020-02-06 MED ORDER — PROMETHAZINE HCL 25 MG/ML IJ SOLN: 6.2500 mg | INTRAMUSCULAR | Status: DC | PRN

## 2020-02-06 MED ORDER — CHLORHEXIDINE GLUCONATE 0.12 % MT SOLN
15.0000 mL | Freq: Once | OROMUCOSAL | Status: AC
  Administered 2020-02-06: 15 mL via OROMUCOSAL

## 2020-02-06 MED ORDER — LIDOCAINE 2% (20 MG/ML) 5 ML SYRINGE
INTRAMUSCULAR | Status: AC
  Filled 2020-02-06: qty 5

## 2020-02-06 MED ORDER — PROPOFOL 10 MG/ML IV BOLUS
INTRAVENOUS | Status: DC | PRN
  Administered 2020-02-06: 10 mg via INTRAVENOUS

## 2020-02-06 MED ORDER — PHENYLEPHRINE 40 MCG/ML (10ML) SYRINGE FOR IV PUSH (FOR BLOOD PRESSURE SUPPORT)
PREFILLED_SYRINGE | INTRAVENOUS | Status: AC
  Filled 2020-02-06: qty 10

## 2020-02-06 MED ORDER — DIATRIZOATE MEGLUMINE 30 % UR SOLN
URETHRAL | Status: AC
  Filled 2020-02-06: qty 100

## 2020-02-06 MED ORDER — LACTATED RINGERS IV SOLN: INTRAVENOUS | Status: DC

## 2020-02-06 MED ORDER — STERILE WATER FOR IRRIGATION IR SOLN
Status: DC | PRN
  Administered 2020-02-06: 500 mL

## 2020-02-06 MED ORDER — DEXAMETHASONE SODIUM PHOSPHATE 10 MG/ML IJ SOLN
INTRAMUSCULAR | Status: AC
  Filled 2020-02-06: qty 1

## 2020-02-06 MED ORDER — POTASSIUM CHLORIDE 10 MEQ/100ML IV SOLN
10.0000 meq | INTRAVENOUS | Status: AC
  Administered 2020-02-06 (×6): 10 meq via INTRAVENOUS
  Filled 2020-02-06 (×6): qty 100

## 2020-02-06 MED ORDER — IOHEXOL 300 MG/ML  SOLN
INTRAMUSCULAR | Status: DC | PRN
  Administered 2020-02-06: 7 mL via URETHRAL

## 2020-02-06 MED ORDER — CHLORHEXIDINE GLUCONATE 0.12 % MT SOLN
15.0000 mL | OROMUCOSAL | Status: AC
  Administered 2020-02-06: 15 mL via OROMUCOSAL

## 2020-02-06 MED ORDER — MAGNESIUM SULFATE 2 GM/50ML IV SOLN
2.0000 g | Freq: Once | INTRAVENOUS | Status: AC
  Administered 2020-02-06: 2 g via INTRAVENOUS
  Filled 2020-02-06: qty 50

## 2020-02-06 MED ORDER — LIDOCAINE 2% (20 MG/ML) 5 ML SYRINGE
INTRAMUSCULAR | Status: DC | PRN
  Administered 2020-02-06: 10 mg via INTRAVENOUS

## 2020-02-06 MED ORDER — MIDAZOLAM HCL 2 MG/2ML IJ SOLN
INTRAMUSCULAR | Status: AC
  Filled 2020-02-06: qty 2

## 2020-02-06 MED ORDER — FENTANYL CITRATE (PF) 100 MCG/2ML IJ SOLN: 25.0000 ug | INTRAMUSCULAR | Status: DC | PRN

## 2020-02-06 MED ORDER — ONDANSETRON HCL 4 MG/2ML IJ SOLN
INTRAMUSCULAR | Status: AC
  Filled 2020-02-06: qty 2

## 2020-02-06 SURGICAL SUPPLY — 15 items
BAG URO CATCHER STRL LF (MISCELLANEOUS) ×2 IMPLANT
CATH INTERMIT  6FR 70CM (CATHETERS) ×2 IMPLANT
CLOTH BEACON ORANGE TIMEOUT ST (SAFETY) ×2 IMPLANT
GAUZE 4X4 16PLY RFD (DISPOSABLE) ×1 IMPLANT
GLOVE BIO SURGEON STRL SZ 6.5 (GLOVE) ×2 IMPLANT
GOWN STRL REUS W/ TWL LRG LVL3 (GOWN DISPOSABLE) ×1 IMPLANT
GOWN STRL REUS W/TWL LRG LVL3 (GOWN DISPOSABLE) ×8 IMPLANT
GUIDEWIRE STR DUAL SENSOR (WIRE) ×3 IMPLANT
KIT TURNOVER KIT A (KITS) ×1 IMPLANT
MANIFOLD NEPTUNE II (INSTRUMENTS) ×2 IMPLANT
PACK CYSTO (CUSTOM PROCEDURE TRAY) ×2 IMPLANT
STENT URET 6FRX24 CONTOUR (STENTS) ×1 IMPLANT
TRAY FOLEY MTR SLVR 16FR STAT (SET/KITS/TRAYS/PACK) ×1 IMPLANT
TUBING CONNECTING 10 (TUBING) ×2 IMPLANT
TUBING UROLOGY SET (TUBING) IMPLANT

## 2020-02-06 NOTE — Anesthesia Preprocedure Evaluation (Signed)
Anesthesia Evaluation  Patient identified by MRN, date of birth, ID band Patient awake    Reviewed: Allergy & Precautions, H&P , NPO status , Patient's Chart, lab work & pertinent test results  Airway Mallampati: Kerry [name]  TM Distance: >3 FB Neck ROM: Full    Dental no notable dental hx. (+) Edentulous Upper, Partial Lower, Poor Dentition, Dental Advisory Given   Pulmonary neg pulmonary ROS, former smoker,  Kerry [name] x 93yrs   Pulmonary exam normal breath sounds clear to auscultation       Cardiovascular hypertension, Pt. on medications and Pt. on home beta blockers + CAD  negative cardio ROS   Rhythm:Regular Rate:Normal     Neuro/Psych  Headaches, Anxiety CVA, Residual Symptoms negative neurological ROS  negative psych ROS   GI/Hepatic negative GI ROS, Neg liver ROS,   Endo/Other  negative endocrine ROSdiabetes, Insulin DependentMorbid obesity  Renal/GU negative Renal ROS  negative genitourinary   Musculoskeletal  (+) Arthritis , Osteoarthritis,  Fibromyalgia -  Abdominal   Peds  Hematology negative hematology ROS (+)   Anesthesia Other Findings   Reproductive/Obstetrics negative OB ROS                             Anesthesia Physical Anesthesia Plan  ASA: III and emergent  Anesthesia Plan: General   Post-op Pain Management:    Induction: Intravenous  PONV Risk Score and Plan: 4 or greater and Ondansetron and Treatment may vary due to age or medical condition  Airway Management Planned: Tracheostomy  Additional Equipment:   Intra-op Plan:   Post-operative Plan: Post-operative intubation/ventilation  Informed Consent: I have reviewed the patients History and Physical, chart, labs and discussed the procedure including the risks, benefits and alternatives for the proposed anesthesia with the patient or authorized representative who has indicated his/her understanding and acceptance.      Dental advisory given  Plan Discussed with: CRNA  Anesthesia Plan Comments:         Anesthesia Quick Evaluation

## 2020-02-06 NOTE — Progress Notes (Addendum)
PROGRESS NOTE    Kerry [name]  IPJ:[id number] DOB: Dec 09, 1955 DOA: 02/02/2020 PCP: Blair Heys, PA-C   Brief Narrative:  Per HPI: Kerry Corporan Pattersonis a 64 y.o.femalewith medical history significant forleft-sided CVA with residual right-sided deficits, prior PE/DVT, CAD, hypertension, hyperlipidemia, and type 2 diabeteswho presented to the ED via EMS with altered mentation. Apparently she was with her husband at home who noticed that she suddenly collapsed and became quite confused. Her son came to the house to evaluate her and she was noted to be somewhat diaphoretic and had called EMS. Patient claims that she was having some burning with urination over the last couple days and did have some diarrhea as well.  -Patient was admitted with severe sepsis secondary to UTI along with associated AKI and severe hypokalemia in the setting of chronic diarrhea.  8/13: Sepsis physiology is resolving this morning with treatment of UTI. She is noted to have gram-negative rods on anaerobic bottle. AKI is also resolving with aggressive IV fluid. Potassium levels are improving as well. Continue IV fluid resuscitation as well as IV antibiotics and advance diet today supplement further potassium and transfer to telemetry if stable.  8/14: Patient continues to have persistent hypokalemia, but kidney function is improved. Sepsis physiology appears to be resolving. Noted to have Klebsiella and Proteus UTI with associated bacteremia. Appears to have some ongoing loose stools.  8/15: Hypokalemia slowly improving. She is still having liquid stools and Flexi-Seal has been placed. KUB concerning for volvulus with CT abdomen pending.  8/16: CT abdomen with findings of nephrolithiasis noted to right kidney with mild hydronephrosis.  Discussed with Dr. Gloriann Loan with urology with plans for stent placement this afternoon.  Remains n.p.o. after midnight.  Continues to have hypokalemia and liquid stools.   Continue ongoing potassium supplementation.  GI consultation appreciated for diarrhea.  C. difficile negative.  Assessment & Plan:   Active Problems:   Severe sepsis (HCC)  Severe sepsis secondary toKlebsiella and ProteusUTIwith associated bacteremia -improving -Continue Rocephin 2 g IV daily -Continue IV fluid -Monitora.m. CBC  Abdominal distention with ongoing liquid stools -CT abdomen with mild prominence of sigmoid colon -Cdiff negative -GI panel pending -Imodium has not been useful -Appreciate GI recommendations today  Right-sided nephrolithiasis with mild hydronephrosis -Urology contacted with plan for stent placement this afternoon  Severe AKI likely secondary to sepsis -resolved -Decrease IV fluid rate -Increased urine output noted -Monitor strict I's and O's -Repeat a.m. labs  Hypokalemia-persistent -Related to ongoing liquid bowel movements -Imodium as needed, has not been helping -Replete and monitor on telemetry -Reevaluate in a.m. -Continue ongoing repletion with IV and p.o. today  Hypomagnesemia -Replete and reevaluate in a.m.  Thrombocytopenia-improving -Likely secondary to sepsis physiology -Continue on Xarelto with return to full dose -Monitor on repeat CBC  Mild hyponatremia-resolved -Continue monitoring  Prior left-sided CVA with residual deficits -Prior tracheostomy from this episode  Prior PE/DVT -Continue anticoagulation with Xarelto, pharmacy to adjust  History of hypertension -Plan to resume home amlodipine and Coreg today  History of dyslipidemia -Continue statin  History of type 2 diabetes-stable -Currently without hyperglycemia -Hold home Lantus andcontinueSSI -Start home Lantus once diet tolerated well -Hemoglobin A1c 8.9%  Right adrenal nodule -Incidentally noted on abdominal CT 8/15 -Likely adenoma -Follow-up CT scan in 1 year  DVT prophylaxis:Xarelto Code Status:Full code Family  Communication:Discussed with son [name] on phone 8/16 Disposition Plan:  Status is: Inpatient  Remains inpatient appropriate because:IV treatments appropriate due to intensity of illness or inability to take  PO and Inpatient level of care appropriate due to severity of illness   Dispo: The patient is from:Home Anticipated d/c is DD:[facility] Anticipated d/c date is: 2-3 days Patient currently is not medically stable to d/c.Patient continues to have significant hypokalemia as well as AKI requiring IV fluid. She requires ongoing treatment of sepsis with UTI and gram-negative rods.  Urology planning for stent placement today.  GI evaluation for ongoing liquid stool.  Ongoing potassium supplementation.  Consultants:  Urology  GI  Procedures:  See below  Antimicrobials:  Anti-infectives (From admission, onward)   Start     Dose/Rate Route Frequency Ordered Stop   02/04/20 1200  cefTRIAXone (ROCEPHIN) 2 g in sodium chloride 0.9 % 100 mL IVPB     Discontinue     2 g 200 mL/hr over 30 Minutes Intravenous Every 24 hours 02/03/20 1421     02/03/20 1430  cefTRIAXone (ROCEPHIN) 1 g in sodium chloride 0.9 % 100 mL IVPB        1 g 200 mL/hr over 30 Minutes Intravenous  Once 02/03/20 1422 02/03/20 1541   02/02/20 1215  cefTRIAXone (ROCEPHIN) 1 g in sodium chloride 0.9 % 100 mL IVPB  Status:  Discontinued        1 g 200 mL/hr over 30 Minutes Intravenous Every 24 hours 02/02/20 1204 02/03/20 1421       Subjective: Patient seen and evaluated today with ongoing liquidy stools noted overnight.  She continues to have persistent hypokalemia as a result.  She is n.p.o. in anticipation for stent placement today.  She is tearful that she cannot eat this morning.  Objective: Vitals:   02/06/20 0300 02/06/20 0400 02/06/20 0600 02/06/20 0810  BP: (!) 127/53 122/78 (!) 154/59 (!) 149/75  Pulse:   80 88  Resp: (!) 26 17 (!) 22 (!) 21  Temp:  98.3 F  (36.8 C)    TempSrc:  Axillary    SpO2:   97% 97%  Weight:      Height:        Intake/Output Summary (Last 24 hours) at 02/06/2020 1027 Last data filed at 02/06/2020 0400 Gross per 24 hour  Intake 2295.8 ml  Output 4600 ml  Net -2304.2 ml   Filed Weights   02/02/20 1600 02/03/20 0200 02/04/20 0445  Weight: 99.8 kg 94.7 kg 95.9 kg    Examination:  General exam: Appears calm and comfortable  Respiratory system: Clear to auscultation. Respiratory effort normal.  Tracheostomy placement with trach collar Cardiovascular system: S1 & S2 heard, RRR.  Gastrointestinal system: Abdomen is soft and minimally distended.  Nontender. Central nervous system: Alert and awake Extremities: No significant edema Skin: No rashes, lesions or ulcers Psychiatry: Appears tearful    Data Reviewed: I have personally reviewed following labs and imaging studies  CBC: Recent Labs  Lab 02/02/20 1125 02/03/20 0317 02/04/20 0634 02/05/20 0556 02/06/20 0446  WBC 20.5* 12.7* 10.7* 8.4 7.7  NEUTROABS 17.4*  --   --   --   --   HGB 11.9* 12.7 12.4 11.4* 11.5*  HCT 36.1 40.3 38.8 36.2 36.5  MCV 81.7 85.0 82.9 83.6 83.7  PLT 94* 77* 86* 89* 542*   Basic Metabolic Panel: Recent Labs  Lab 02/02/20 1125 02/02/20 1125 02/03/20 0317 02/03/20 0317 02/03/20 1537 02/04/20 0634 02/04/20 1632 02/05/20 0556 02/06/20 0446  NA 133*  --  142  --   --  140  --  136 135  K <2.0*   < > 2.5*   < >  3.1* 2.5* 2.8* 3.4* 3.3*  CL 96*  --  110  --   --  105  --  103 99  CO2 20*  --  22  --   --  24  --  25 28  GLUCOSE 113*  --  147*  --   --  96  --  166* 240*  BUN 57*  --  43*  --   --  13  --  8 8  CREATININE 4.65*  --  2.32*  --   --  0.92  --  0.73 0.62  CALCIUM 7.1*  --  7.4*  --   --  7.7*  --  7.7* 8.1*  MG  --   --  2.1  --   --  1.4*  --  1.6* 1.4*   < > = values in this interval not displayed.   GFR: Estimated Creatinine Clearance: 83.6 mL/min (by C-G formula based on SCr of 0.62 mg/dL). Liver  Function Tests: Recent Labs  Lab 02/02/20 1125 02/03/20 0317  AST 13* 15  ALT 12 14  ALKPHOS 65 78  BILITOT 0.9 0.6  PROT 6.2* 6.7  ALBUMIN 2.6* 2.6*   No results for input(s): LIPASE, AMYLASE in the last 168 hours. No results for input(s): AMMONIA in the last 168 hours. Coagulation Profile: Recent Labs  Lab 02/02/20 1125 02/03/20 0317  INR 1.7* 2.5*   Cardiac Enzymes: No results for input(s): CKTOTAL, CKMB, CKMBINDEX, TROPONINI in the last 168 hours. BNP (last 3 results) No results for input(s): PROBNP in the last 8760 hours. HbA1C: No results for input(s): HGBA1C in the last 72 hours. CBG: Recent Labs  Lab 02/05/20 0723 02/05/20 1125 02/05/20 1636 02/05/20 2257 02/06/20 0721  GLUCAP 154* 242* 163* 202* 221*   Lipid Profile: No results for input(s): CHOL, HDL, LDLCALC, TRIG, CHOLHDL, LDLDIRECT in the last 72 hours. Thyroid Function Tests: No results for input(s): TSH, T4TOTAL, FREET4, T3FREE, THYROIDAB in the last 72 hours. Anemia Panel: No results for input(s): VITAMINB12, FOLATE, FERRITIN, TIBC, IRON, RETICCTPCT in the last 72 hours. Sepsis Labs: Recent Labs  Lab 02/02/20 1125 02/02/20 1348 02/03/20 0317 02/04/20 0634  PROCALCITON  --   --  >150.00  --   LATICACIDVEN 1.8 1.9 1.6 1.0    Recent Results (from the past 240 hour(s))  Culture, blood (Routine x 2)     Status: Abnormal   Collection Time: 02/02/20 11:41 AM   Specimen: BLOOD  Result Value Ref Range Status   Specimen Description   Final    BLOOD LEFT ANTECUBITAL Performed at [facility], [address], [place], [place] [address]    Special Requests   Final    BOTTLES DRAWN AEROBIC AND ANAEROBIC Blood Culture adequate volume Performed at [facility], [address], [place], [place] [address]    Culture  Setup Time   Final    GRAM NEGATIVE RODS Gram Stain Report Called to,Read Back By and Verified With: HEARN,J AT 0703 ON 02/03/20 BY HUFFINES,S. IN BOTH AEROBIC AND ANAEROBIC BOTTLES Gram  Stain Report Called to,Read Back By and Verified With: PREVIOUSLY CALLED @[id number]  08/13/2021KAY CRITICAL RESULT CALLED TO, READ BACK BY AND VERIFIED WITH: PHARMD Alison Stalling 1402 [id number] FCP Performed at [facility], [place] [address] [place], [place] [address]    Culture KLEBSIELLA PNEUMONIAE PROTEUS MIRABILIS  (A)  Final   Report Status 02/05/2020 FINAL  Final   Organism ID, Bacteria KLEBSIELLA PNEUMONIAE  Final   Organism ID,  Bacteria PROTEUS MIRABILIS  Final      Susceptibility   Klebsiella pneumoniae - MIC*    AMPICILLIN RESISTANT Resistant     CEFAZOLIN <=4 SENSITIVE Sensitive     CEFEPIME <=0.12 SENSITIVE Sensitive     CEFTAZIDIME <=1 SENSITIVE Sensitive     CEFTRIAXONE <=0.25 SENSITIVE Sensitive     CIPROFLOXACIN <=0.25 SENSITIVE Sensitive     GENTAMICIN <=1 SENSITIVE Sensitive     IMIPENEM <=0.25 SENSITIVE Sensitive     TRIMETH/SULFA >=320 RESISTANT Resistant     AMPICILLIN/SULBACTAM 4 SENSITIVE Sensitive     PIP/TAZO <=4 SENSITIVE Sensitive     * KLEBSIELLA PNEUMONIAE   Proteus mirabilis - MIC*    AMPICILLIN <=2 SENSITIVE Sensitive     CEFAZOLIN 8 SENSITIVE Sensitive     CEFEPIME <=0.12 SENSITIVE Sensitive     CEFTAZIDIME <=1 SENSITIVE Sensitive     CEFTRIAXONE <=0.25 SENSITIVE Sensitive     CIPROFLOXACIN <=0.25 SENSITIVE Sensitive     GENTAMICIN <=1 SENSITIVE Sensitive     IMIPENEM 4 SENSITIVE Sensitive     TRIMETH/SULFA <=20 SENSITIVE Sensitive     AMPICILLIN/SULBACTAM <=2 SENSITIVE Sensitive     PIP/TAZO <=4 SENSITIVE Sensitive     * PROTEUS MIRABILIS  Blood Culture ID Panel (Reflexed)     Status: Abnormal   Collection Time: 02/02/20 11:41 AM  Result Value Ref Range Status   Enterococcus faecalis NOT DETECTED NOT DETECTED Final   Enterococcus Faecium NOT DETECTED NOT DETECTED Final   Listeria monocytogenes NOT DETECTED NOT DETECTED Final   Staphylococcus species NOT DETECTED NOT DETECTED Final   Staphylococcus aureus (BCID) NOT DETECTED NOT DETECTED Final    Staphylococcus epidermidis NOT DETECTED NOT DETECTED Final   Staphylococcus lugdunensis NOT DETECTED NOT DETECTED Final   Streptococcus species NOT DETECTED NOT DETECTED Final   Streptococcus agalactiae NOT DETECTED NOT DETECTED Final   Streptococcus pneumoniae NOT DETECTED NOT DETECTED Final   Streptococcus pyogenes NOT DETECTED NOT DETECTED Final   A.calcoaceticus-baumannii NOT DETECTED NOT DETECTED Final   Bacteroides fragilis NOT DETECTED NOT DETECTED Final   Enterobacterales DETECTED (A) NOT DETECTED Final    Comment: Enterobacterales represent a large order of gram negative bacteria, not a single organism. CRITICAL RESULT CALLED TO, READ BACK BY AND VERIFIED WITH: PHARMD GARRETT C. 1402 [id number] FCP    Enterobacter cloacae complex NOT DETECTED NOT DETECTED Final   Escherichia coli NOT DETECTED NOT DETECTED Final   Klebsiella aerogenes NOT DETECTED NOT DETECTED Final   Klebsiella oxytoca NOT DETECTED NOT DETECTED Final   Klebsiella pneumoniae DETECTED (A) NOT DETECTED Final    Comment: CRITICAL RESULT CALLED TO, READ BACK BY AND VERIFIED WITH: PHARMD GARRETT C. 1402 [id number] FCP    Proteus species NOT DETECTED NOT DETECTED Final   Salmonella species NOT DETECTED NOT DETECTED Final   Serratia marcescens NOT DETECTED NOT DETECTED Final   Haemophilus influenzae NOT DETECTED NOT DETECTED Final   Neisseria meningitidis NOT DETECTED NOT DETECTED Final   Pseudomonas aeruginosa NOT DETECTED NOT DETECTED Final   Stenotrophomonas maltophilia NOT DETECTED NOT DETECTED Final   Candida albicans NOT DETECTED NOT DETECTED Final   Candida auris NOT DETECTED NOT DETECTED Final   Candida glabrata NOT DETECTED NOT DETECTED Final   Candida krusei NOT DETECTED NOT DETECTED Final   Candida parapsilosis NOT DETECTED NOT DETECTED Final   Candida tropicalis NOT DETECTED NOT DETECTED Final   Cryptococcus neoformans/gattii NOT DETECTED NOT DETECTED Final   CTX-M ESBL NOT DETECTED NOT DETECTED Final  Carbapenem resistance IMP NOT DETECTED NOT DETECTED Final   Carbapenem resistance KPC NOT DETECTED NOT DETECTED Final   Carbapenem resistance NDM NOT DETECTED NOT DETECTED Final   Carbapenem resist OXA 48 LIKE NOT DETECTED NOT DETECTED Final   Carbapenem resistance VIM NOT DETECTED NOT DETECTED Final    Comment: Performed at [facility], [place] [address], [place], [place] [address]  SARS Coronavirus 2 by RT PCR (hospital order, performed in [facility] hospital lab) Nasopharyngeal Nasopharyngeal Swab     Status: None   Collection Time: 02/02/20 12:02 PM   Specimen: Nasopharyngeal Swab  Result Value Ref Range Status   SARS Coronavirus 2 NEGATIVE NEGATIVE Final    Comment: (NOTE) SARS-CoV-2 target nucleic acids are NOT DETECTED.  The SARS-CoV-2 RNA is generally detectable in upper and lower respiratory specimens during the acute phase of infection. The lowest concentration of SARS-CoV-2 viral copies this assay can detect is 250 copies / mL. A negative result does not preclude SARS-CoV-2 infection and should not be used as the sole basis for treatment or other patient management decisions.  A negative result may occur with improper specimen collection / handling, submission of specimen other than nasopharyngeal swab, presence of viral mutation(s) within the areas targeted by this assay, and inadequate number of viral copies (<250 copies / mL). A negative result must be combined with clinical observations, patient history, and epidemiological information.  Fact Sheet for Patients:   StrictlyIdeas.no  Fact Sheet for Healthcare Providers: BankingDealers.co.za  This test is not yet approved or  cleared by the [place] FDA and has been authorized for detection and/or diagnosis of SARS-CoV-2 by FDA under an Emergency Use Authorization (EUA).  This EUA will remain in effect (meaning this test can be used) for the duration of  the COVID-19 declaration under Section 564(b)(1) of the Act, 21 U.S.C. section 360bbb-3(b)(1), unless the authorization is terminated or revoked sooner.  Performed at [facility], [address], [place], [place] [address]   Urine culture     Status: Abnormal   Collection Time: 02/02/20 12:04 PM   Specimen: In/Out Cath Urine  Result Value Ref Range Status   Specimen Description   Final    IN/OUT CATH URINE Performed at [facility], [address], [place], [place] [address]    Special Requests   Final    NONE Performed at [facility], [address] [place], [place] [address]    Culture (A)  Final    >=100,000 COLONIES/mL KLEBSIELLA PNEUMONIAE >=100,000 COLONIES/mL PROTEUS MIRABILIS    Report Status 02/04/2020 FINAL  Final   Organism ID, Bacteria KLEBSIELLA PNEUMONIAE (A)  Final   Organism ID, Bacteria PROTEUS MIRABILIS (A)  Final      Susceptibility   Klebsiella pneumoniae - MIC*    AMPICILLIN RESISTANT Resistant     CEFAZOLIN <=4 SENSITIVE Sensitive     CEFTRIAXONE <=0.25 SENSITIVE Sensitive     CIPROFLOXACIN <=0.25 SENSITIVE Sensitive     GENTAMICIN <=1 SENSITIVE Sensitive     IMIPENEM <=0.25 SENSITIVE Sensitive     NITROFURANTOIN 64 INTERMEDIATE Intermediate     TRIMETH/SULFA >=320 RESISTANT Resistant     AMPICILLIN/SULBACTAM 4 SENSITIVE Sensitive     PIP/TAZO <=4 SENSITIVE Sensitive     * >=100,000 COLONIES/mL KLEBSIELLA PNEUMONIAE   Proteus mirabilis - MIC*    AMPICILLIN <=2 SENSITIVE Sensitive     CEFAZOLIN 8 SENSITIVE Sensitive     CEFTRIAXONE <=0.25 SENSITIVE Sensitive     CIPROFLOXACIN <=0.25 SENSITIVE Sensitive  GENTAMICIN <=1 SENSITIVE Sensitive     IMIPENEM 4 SENSITIVE Sensitive     NITROFURANTOIN 128 RESISTANT Resistant     TRIMETH/SULFA <=20 SENSITIVE Sensitive     AMPICILLIN/SULBACTAM <=2 SENSITIVE Sensitive     PIP/TAZO <=4 SENSITIVE Sensitive     * >=100,000 COLONIES/mL PROTEUS MIRABILIS  Culture, blood (Routine x 2)     Status: None  (Preliminary result)   Collection Time: 02/02/20 12:34 PM   Specimen: BLOOD  Result Value Ref Range Status   Specimen Description BLOOD RIGHT HAND  Final   Special Requests   Final    BOTTLES DRAWN AEROBIC AND ANAEROBIC Blood Culture adequate volume   Culture   Final    NO GROWTH 4 DAYS Performed at [facility], [address] [place], [place] [address]    Report Status PENDING  Incomplete  MRSA PCR Screening     Status: None   Collection Time: 02/03/20  1:41 AM   Specimen: Nasal Mucosa; Nasopharyngeal  Result Value Ref Range Status   MRSA by PCR NEGATIVE NEGATIVE Final    Comment:        The GeneXpert MRSA Assay (FDA approved for NASAL specimens only), is one component of a comprehensive MRSA colonization surveillance program. It is not intended to diagnose MRSA infection nor to guide or monitor treatment for MRSA infections. Performed at [facility], [address], [place], [place] [address]   C Difficile Quick Screen (NO PCR Reflex)     Status: None   Collection Time: 02/05/20  9:32 AM   Specimen: STOOL  Result Value Ref Range Status   C Diff antigen NEGATIVE NEGATIVE Final   C Diff toxin NEGATIVE NEGATIVE Final   C Diff interpretation No C. difficile detected.  Final    Comment: Performed at [facility], [address] [place], [place] [address]         Radiology Studies: DG Abd 1 View  Result Date: 02/05/2020 CLINICAL DATA:  Gaseous abdominal distention. EXAM: ABDOMEN - 1 VIEW COMPARISON:  CT abdomen pelvis 09/19/2017. FINDINGS: Gaseous distended loops of small large bowel are demonstrated throughout the abdomen. There appears to be marked colonic distension. Supine evaluation limited for the detection of free intraperitoneal air. Thoracic spine degenerative changes. IMPRESSION: Marked colonic distension. Given the appearance on abdominal radiograph, recommend further evaluation with CT to exclude the possibility of a volvulus or other obstructing cause. These  results were called by telephone at the time of interpretation on 02/05/2020 at 9:49 am to provider [facility] , who verbally acknowledged these results. Electronically Signed   By: Lovey Newcomer M.D.   On: 02/05/2020 09:49   CT ABDOMEN PELVIS W CONTRAST  Result Date: 02/05/2020 CLINICAL DATA:  Abdominal distension, possible bowel obstruction. EXAM: CT ABDOMEN AND PELVIS WITH CONTRAST TECHNIQUE: Multidetector CT imaging of the abdomen and pelvis was performed using the standard protocol following bolus administration of intravenous contrast. CONTRAST:  170mL OMNIPAQUE IOHEXOL 300 MG/ML  SOLN COMPARISON:  09/19/2017 FINDINGS: Lower chest: Small bilateral pleural effusions with associated bibasilar atelectasis. Mild cardiomegaly with small pericardial effusion. Calcified plaque over the left anterior descending and right coronary arteries. Minimal calcified plaque over the descending thoracic aorta. Hepatobiliary: Liver and biliary tree are normal. Possible minimal layering sludge versus noncalcified gallstones. Pancreas: Normal. Spleen: Multiple punctate calcified granulomas. Adrenals/Urinary Tract: 1.3 cm bulbous appearance to the medial limb of the right adrenal gland. Left adrenal gland is normal. Kidneys are normal in size. No left renal mass or hydronephrosis.  No left-sided nephrolithiasis. There is right-sided nephrolithiasis with a 1.6 cm stone over the upper pole. There are a few right renal cysts unchanged. Mild right-sided hydronephrosis. Mild prominence of the right ureter containing 2 adjacent stones over the distal ureter at and just above the UVJ with the larger just above the UVJ measuring 4 mm and smaller stone at the UVJ measuring 2 mm. Left ureter and bladder are normal. Stomach/Bowel: Stomach and small bowel are normal. Previous appendectomy. Rectal tube is present. There is a redundant sigmoid colon. Mild prominence of the sigmoid colon measuring 6.2 cm in the mid abdomen right of midline.  Sigmoid colon proximal and distal to this air-filled segment is contracted. No inflammatory change or obstruction to suggest volvulus. Vascular/Lymphatic: Calcified plaque over the abdominal aorta which is normal in caliber. No adenopathy. Reproductive: Uterus and right ovary are normal. There is a simple appearing 7.1 cm left ovarian cyst unchanged. Other: Minimal free pelvic fluid. Musculoskeletal: Degenerative change of the spine and hips. IMPRESSION: 1. Right-sided nephrolithiasis. Two adjacent stones over the distal right ureter at and just above the UVJ with the larger measuring 4 mm and smaller stone at the UVJ measuring 2 mm. Low-grade obstruction with mild right-sided hydronephrosis. 2. Right renal cysts unchanged. 3. Stable 7.1 cm simple appearing left ovarian cyst. Recommend pelvic ultrasound for further evaluation. 4. Possible minimal layering sludge versus noncalcified gallstones. 5. Aortic atherosclerosis. Atherosclerotic coronary artery disease. Mild cardiomegaly with small pericardial effusion. 6. Small bilateral pleural effusions with associated bibasilar atelectasis. 7. Possible 1.3 cm right adrenal nodule likely adenoma. Recommend follow-up adrenal CT 1 year. Aortic Atherosclerosis (ICD10-I70.0). Electronically Signed   By: Marin Olp M.D.   On: 02/05/2020 17:49        Scheduled Meds: . amLODipine  10 mg Oral Daily  . atorvastatin  40 mg Oral q1800  . carvedilol  12.5 mg Oral BID WC  . Chlorhexidine Gluconate Cloth  6 each Topical Daily  . citalopram  20 mg Oral Daily  . insulin aspart  0-5 Units Subcutaneous QHS  . insulin aspart  0-9 Units Subcutaneous TID WC  . rivaroxaban  20 mg Oral Q supper   Continuous Infusions: . sodium chloride 75 mL/hr at 02/06/20 0400  . cefTRIAXone (ROCEPHIN)  IV Stopped (02/05/20 1216)  . potassium chloride 10 mEq (02/06/20 0955)     LOS: 4 days    Time spent: 35 minutes    Kauri Garson D Manuella Ghazi, DO [facility]  If 7PM-7AM, please  contact night-coverage [email or web address] 02/06/2020, 10:27 AM

## 2020-02-06 NOTE — Anesthesia Preprocedure Evaluation (Addendum)
Anesthesia Evaluation  Patient identified by MRN, date of birth, ID band Patient awake    Reviewed: Allergy & Precautions, H&P , NPO status , Patient's Chart, lab work & pertinent test results  History of Anesthesia Complications Negative for: history of anesthetic complications  Airway Mallampati: Trach  TM Distance: >3 FB Neck ROM: Full    Dental no notable dental hx. (+) Dental Advisory Given, Poor Dentition, Edentulous Upper, Partial Lower   Pulmonary pneumonia, resolved, former smoker, PE Acute respiratory failure, tracheostomy COMPARISON:  2019  FINDINGS: Tracheostomy device is present. Patient is rotated. Stable chronic interstitial prominence. No significant pleural effusion. No pneumothorax. Similar cardiomediastinal contours with probable mild cardiomegaly.  IMPRESSION: No acute process in the chest.   Electronically Signed   By: Macy Mis M.D.   On: 02/02/2020 12:43   Pulmonary exam normal breath sounds clear to auscultation       Cardiovascular Exercise Tolerance: Poor hypertension, Pt. on medications and Pt. on home beta blockers + angina + CAD  Normal cardiovascular exam Rhythm:Regular Rate:Normal  02-Feb-2020 11:22:01 [facility]-ED ROUTINE RECORD Normal sinus rhythm Nonspecific ST and T wave abnormality Prolonged QT Abnormal ECG Confirmed by Gerlene Fee [phone]) on 02/02/2020 12:07:34 PM   Neuro/Psych  Headaches, PSYCHIATRIC DISORDERS Anxiety  Neuromuscular disease CVA, Residual Symptoms    GI/Hepatic negative GI ROS, Neg liver ROS,   Endo/Other  diabetes, Well Controlled, Type 2, Oral Hypoglycemic Agents  Renal/GU Renal InsufficiencyRenal disease  negative genitourinary   Musculoskeletal  (+) Arthritis , Fibromyalgia -  Abdominal   Peds  Hematology negative hematology ROS (+)   Anesthesia Other Findings   Reproductive/Obstetrics negative OB ROS                           Anesthesia Physical Anesthesia Plan  ASA: IV and emergent  Anesthesia Plan: General   Post-op Pain Management:    Induction: Intravenous  PONV Risk Score and Plan: 4 or greater and Ondansetron and Treatment may vary due to age or medical condition  Airway Management Planned: Tracheostomy  Additional Equipment:   Intra-op Plan:   Post-operative Plan: Post-operative intubation/ventilation  Informed Consent: I have reviewed the patients History and Physical, chart, labs and discussed the procedure including the risks, benefits and alternatives for the proposed anesthesia with the patient or authorized representative who has indicated his/her understanding and acceptance.     Dental advisory given  Plan Discussed with: CRNA and Surgeon  Anesthesia Plan Comments: (Patient" tracheostomy tube has no cuff. Risk of aspiration is high with uncuffed tube, She is on high levels of oxygen and may not tolerate the sedation. Discussed with Dr. Alyson Ingles, will transfer to the facility where they can do the procedure safely with appropriate equipment and personnel.)     Anesthesia Quick Evaluation

## 2020-02-06 NOTE — Consult Note (Addendum)
Referring Provider: Dr. Manuella Ghazi  Primary Care Physician:  Blair Heys, PA-C Primary Gastroenterologist:  Dr. Abbey Chatters  Date of Admission: 02/02/20 Date of Consultation: 02/06/20  Reason for Consultation:  Diarrhea  HPI:  Kerry [name] is a 64 y.o. year old female with history significant for left-sided CVA with residual right-sided deficits, prior PE/DVT, multiple comorbidities, presenting this admission with altered mental status. Admitted with severe sepsis in setting of UTI, bacteremia, acute renal injury, severe hypokalemia in setting of chronic diarrhea. CT with findings of nephrolithiasis to right kidney and mild hydronephrosis and plans for stent placement this afternoon. Due to persistent hypokalemia with ongoing diarrhea, GI consultation has been requested. Cdiff negative yesterday. GI pathogen panel remains in process.   Patient is a limited historian. Kerry [name] answers majority of questions saying "I don't know". I called husband and spoke with him for information.    Husband states: postprandial diarrhea. Present since 2016. Hasn't seen solid stool since then. Mostly watery. No overt GI bleeding. Vague abdominal discomfort/cramping. Husband believes pepto has been tried in the past. Husband doesn't believe Kerry [name] ever sought care about this. No family history of colorectal cancer or polyps that husband is aware. No unexplained weight loss. No changes in appetite. Most recent A1c 8.9.   Per nursing staff, output of 71ml today in flexi-seal. 150 ml yesterday.      Past Medical History:  Diagnosis Date   Breast cancer ([place]) 03/10/2007   Left breat   Carpal tunnel syndrome on right    Coronary artery disease    Degenerative disc disease, cervical    Degenerative disc disease, lumbar    Diabetes mellitus    Elevated troponin    Fibromyalgia    GIB (gastrointestinal bleeding)    HTN (hypertension) 06/13/2015   Hyperlipidemia    Hypertension    PE (pulmonary embolism)    Stridor     due to vocal cord paresis / hypomobility as seen on laryngoscopy 06/08/15.   Stroke [facility]) 2009   paralyzed on right side   UTI (lower urinary tract infection)     Past Surgical History:  Procedure Laterality Date   APPENDECTOMY     BREAST SURGERY     CARDIAC CATHETERIZATION  2005   CARDIAC CATHETERIZATION N/A 04/13/2015   Procedure: Temporary Pacemaker;  Surgeon: Peter M Martinique, MD;  Location: [facility];  Service: Cardiovascular;  Laterality: N/A;   CARDIAC SURGERY     ESOPHAGOGASTRODUODENOSCOPY N/A 04/18/2015   Procedure: ESOPHAGOGASTRODUODENOSCOPY (EGD);  Surgeon: Judeth Horn, MD;  Location: [facility] END[facility]COPY;  Service: General;  Laterality: N/A;   IR GASTROSTOMY TUBE REMOVAL  11/05/2016   MASTECTOMY Left 03/2007   PEG PLACEMENT N/A 04/18/2015   Procedure: PERCUTANEOUS ENDOSCOPIC GASTROSTOMY (PEG) PLACEMENT;  Surgeon: Judeth Horn, MD;  Location: [place];  Service: General;  Laterality: N/A;  bedside   RADIOLOGY WITH ANESTHESIA N/A 04/11/2015   Procedure: RADIOLOGY WITH ANESTHESIA;  Surgeon: Luanne Bras, MD;  Location: [place];  Service: Radiology;  Laterality: N/A;   TUBAL LIGATION Right    VEIN SURGERY Right 2005    Prior to Admission medications   Medication Sig Start Date End Date Taking? Authorizing Provider  amLODipine (NORVASC) 10 MG tablet Take 1 tablet (10 mg total) by mouth daily. 07/09/15  Yes Mikhail, Velta Addison, DO  atorvastatin (LIPITOR) 40 MG tablet Take 1 tablet (40 mg total) by mouth daily at 6 PM. 07/09/15  Yes Mikhail, Velta Addison, DO  baclofen (LIORESAL) 10 MG tablet Take 10 mg by  mouth 2 (two) times daily. 10/01/19  Yes [provider]  carvedilol (COREG) 12.5 MG tablet Take 1 tablet (12.5 mg total) by mouth 2 (two) times daily with a meal. 07/09/15  Yes Mikhail, [place], DO  Cholecalciferol ([employer] D3) 5000 units capsule Take 5,000 Units by mouth daily.   Yes [provider]  citalopram (CELEXA) 20 MG tablet Take 20 mg by mouth daily. 07/28/15   Yes [provider]  fluticasone (FLONASE) 50 MCG/ACT nasal spray Place 2 sprays into both nostrils daily. Patient taking differently: Place 2 sprays into both nostrils daily as needed for allergies.  10/30/16  Yes Erick Colace, NP  Insulin Glargine (LANTUS) 100 UNIT/ML Solostar Pen Inject 5 Units into the skin daily at 10 pm. Patient taking differently: Inject 30 Units into the skin daily at 10 pm.  07/09/15  Yes Mikhail, Velta Addison, DO  lisinopril (PRINIVIL,ZESTRIL) 10 MG tablet Take 1 tablet (10 mg total) by mouth daily. 07/09/15  Yes Mikhail, Maryann, DO  pravastatin (PRAVACHOL) 40 MG tablet Take 40 mg by mouth daily. 12/30/19  Yes [provider]  rivaroxaban (XARELTO) 20 MG TABS tablet Take 1 tablet (20 mg total) by mouth daily with supper. 07/09/15  Yes Mikhail, Velta Addison, DO  traMADol (ULTRAM) 50 MG tablet Take 50 mg by mouth every 6 (six) hours as needed for moderate pain.    Yes [provider]  Insulin Pen Needle 32G X 4 MM MISC Use with insulin pen to dispense insulin daily 07/09/15   [name], DO    Current Facility-Administered Medications  Medication Dose Route Frequency Provider Last Rate Last Admin   0.9 %  sodium chloride infusion   Intravenous Continuous Heath Lark D, DO 75 mL/hr at 02/06/20 0400 Rate Verify at 02/06/20 0400   acetaminophen (TYLENOL) tablet 650 mg  650 mg Oral Q6H PRN Manuella Ghazi, Pratik D, DO       Or   acetaminophen (TYLENOL) suppository 650 mg  650 mg Rectal Q6H PRN Manuella Ghazi, Pratik D, DO       amLODipine (NORVASC) tablet 10 mg  10 mg Oral Daily Manuella Ghazi, Pratik D, DO   10 mg at 02/06/20 0954   atorvastatin (LIPITOR) tablet 40 mg  40 mg Oral q1800 Manuella Ghazi, Pratik D, DO   40 mg at 02/05/20 1830   carvedilol (COREG) tablet 12.5 mg  12.5 mg Oral BID WC Manuella Ghazi, Pratik D, DO   12.5 mg at 02/06/20 0801   cefTRIAXone (ROCEPHIN) 2 g in sodium chloride 0.9 % 100 mL IVPB  2 g Intravenous Q24H Manuella Ghazi, Pratik D, DO 200 mL/hr at 02/06/20 1201 2 g at 02/06/20 1201    Chlorhexidine Gluconate Cloth 2 % PADS 6 each  6 each Topical Daily Heath Lark D, DO   6 each at 02/06/20 0954   citalopram (CELEXA) tablet 20 mg  20 mg Oral Daily Manuella Ghazi, Pratik D, DO   20 mg at 02/06/20 0954   fluticasone (FLONASE) 50 MCG/ACT nasal spray 2 spray  2 spray Each Nare Daily PRN Manuella Ghazi, Pratik D, DO       insulin aspart (novoLOG) injection 0-5 Units  0-5 Units Subcutaneous QHS Manuella Ghazi, Pratik D, DO   2 Units at 02/02/20 2241   insulin aspart (novoLOG) injection 0-9 Units  0-9 Units Subcutaneous TID WC Shah, Pratik D, DO   2 Units at 02/06/20 1223   loperamide (IMODIUM) capsule 2 mg  2 mg Oral PRN Heath Lark D, DO   2 mg at 02/05/20 [id number]  ondansetron (ZOFRAN) tablet 4 mg  4 mg Oral Q6H PRN Manuella Ghazi, Pratik D, DO       Or   ondansetron (ZOFRAN) injection 4 mg  4 mg Intravenous Q6H PRN Manuella Ghazi, Pratik D, DO       potassium chloride 10 mEq in 100 mL IVPB  10 mEq Intravenous Q1 Hr x 6 Heath Lark D, DO 100 mL/hr at 02/06/20 1159 10 mEq at 02/06/20 1159   rivaroxaban (XARELTO) tablet 20 mg  20 mg Oral Q supper Heath Lark D, DO   20 mg at 02/05/20 1642   traMADol (ULTRAM) tablet 50 mg  50 mg Oral Q6H PRN Manuella Ghazi, Pratik D, DO        Allergies as of 02/02/2020 - Review Complete 02/02/2020  Allergen Reaction Noted   Codeine Nausea Only 05/16/2015   Oxycodone Nausea Only 09/13/2011   Oxycontin [oxycodone hcl] Nausea Only 11/08/2012    Family History  Problem Relation Age of Onset   Cancer Mother        Breast cancer (right)   Cancer Maternal Aunt        Breast cancer   Cancer Maternal Aunt        Breast cancer   Cancer Cousin        Breast Cancer   Diabetes Sister    Colon cancer Neg Hx     Social History   Socioeconomic History   Marital status: Married    Spouse name: Not on file   Number of children: Not on file   Years of education: Not on file   Highest education level: Not on file  Occupational History   Not on file  Tobacco Use   Smoking status: Former Smoker     Packs/day: 0.25    Quit date: 08/14/2000    Years since quitting: 19.4   Smokeless tobacco: Never Used  Vaping Use   Vaping Use: Never used  Substance and Sexual Activity   Alcohol use: Yes    Comment: Social drinker   Drug use: No    Comment: Tried crack cocaine and marijuana in the past   Sexual activity: Never    Birth control/protection: None  Other Topics Concern   Not on file  Social History Narrative   Not on file   Social Determinants of Health   Financial Resource Strain:    Difficulty of Paying Living Expenses:   Food Insecurity:    Worried About Charity fundraiser in the Last Year:    Arboriculturist in the Last Year:   Transportation Needs:    Film/video editor (Medical):    Lack of Transportation (Non-Medical):   Physical Activity:    Days of Exercise per Week:    Minutes of Exercise per Session:   Stress:    Feeling of Stress :   Social Connections:    Frequency of Communication with Friends and Family:    Frequency of Social Gatherings with Friends and Family:    Attends Religious Services:    Active Member of Clubs or Organizations:    Attends Music therapist:    Marital Status:   Intimate Partner Violence:    Fear of Current or Ex-Partner:    Emotionally Abused:    Physically Abused:    Sexually Abused:     Review of Systems: Unable to obtain.   Physical Exam: Vital signs in last 24 hours: Temp:  [98.3 F (36.8 C)-99.2 F (37.3 C)] 98.3 F (36.8  C) (08/16 0400) Pulse Rate:  [73-88] 86 (08/16 1126) Resp:  [15-29] 23 (08/16 1126) BP: (113-154)/(49-78) 136/60 (08/16 1126) SpO2:  [93 %-99 %] 97 % (08/16 1126) FiO2 (%):  [35 %] 35 % (08/16 1126) Last BM Date: 02/05/20 General:   Alert,  Chronically ill-appearing, trach in place.  Head:  Normocephalic and atraumatic. Eyes:  Sclera clear, no icterus.    Ears:  Normal auditory acuity. Nose:  No deformity, discharge,  or lesions. Mouth:  No deformity or lesions, dentition  normal. Neck:  Trach in place Lungs:  Scattered rhonchi Heart:  S1 S2 present without obvious murmur. Abdomen:  Soft, obese, nontender and nondistended. No masses, hepatosplenomegaly or hernias noted. Normal bowel sounds, without guarding, and without rebound.   Rectal:  Deferred  Msk:  Right-sided paralysis Extremities:  Without edema.  Neurologic:  Alert and  oriented to person Psych: Normal mood and affect.  Intake/Output from previous day: 08/15 0701 - 08/16 0700 In: 2760.8 [P.O.:720; I.V.:1532.7; IV Piggyback:508.2] Out: 7650 [Urine:7650] Intake/Output this shift: No intake/output data recorded.  Lab Results: Recent Labs    02/04/20 0634 02/05/20 0556 02/06/20 0446  WBC 10.7* 8.4 7.7  HGB 12.4 11.4* 11.5*  HCT 38.8 36.2 36.5  PLT 86* 89* 102*   BMET Recent Labs    02/04/20 0634 02/04/20 0634 02/04/20 1632 02/05/20 0556 02/06/20 0446  NA 140  --   --  136 135  K 2.5*   < > 2.8* 3.4* 3.3*  CL 105  --   --  103 99  CO2 24  --   --  25 28  GLUCOSE 96  --   --  166* 240*  BUN 13  --   --  8 8  CREATININE 0.92  --   --  0.73 0.62  CALCIUM 7.7*  --   --  7.7* 8.1*   < > = values in this interval not displayed.   C-Diff Recent Labs    02/05/20 0932  CDIFFTOX NEGATIVE    Studies/Results: DG Abd 1 View  Result Date: 02/05/2020 CLINICAL DATA:  Gaseous abdominal distention. EXAM: ABDOMEN - 1 VIEW COMPARISON:  CT abdomen pelvis 09/19/2017. FINDINGS: Gaseous distended loops of small large bowel are demonstrated throughout the abdomen. There appears to be marked colonic distension. Supine evaluation limited for the detection of free intraperitoneal air. Thoracic spine degenerative changes. IMPRESSION: Marked colonic distension. Given the appearance on abdominal radiograph, recommend further evaluation with CT to exclude the possibility of a volvulus or other obstructing cause. These results were called by telephone at the time of interpretation on 02/05/2020 at 9:49 am  to provider [facility] , who verbally acknowledged these results. Electronically Signed   By: Lovey Newcomer M.D.   On: 02/05/2020 09:49   CT ABDOMEN PELVIS W CONTRAST  Result Date: 02/05/2020 CLINICAL DATA:  Abdominal distension, possible bowel obstruction. EXAM: CT ABDOMEN AND PELVIS WITH CONTRAST TECHNIQUE: Multidetector CT imaging of the abdomen and pelvis was performed using the standard protocol following bolus administration of intravenous contrast. CONTRAST:  155mL OMNIPAQUE IOHEXOL 300 MG/ML  SOLN COMPARISON:  09/19/2017 FINDINGS: Lower chest: Small bilateral pleural effusions with associated bibasilar atelectasis. Mild cardiomegaly with small pericardial effusion. Calcified plaque over the left anterior descending and right coronary arteries. Minimal calcified plaque over the descending thoracic aorta. Hepatobiliary: Liver and biliary tree are normal. Possible minimal layering sludge versus noncalcified gallstones. Pancreas: Normal. Spleen: Multiple punctate calcified granulomas. Adrenals/Urinary Tract: 1.3 cm bulbous appearance to the medial  limb of the right adrenal gland. Left adrenal gland is normal. Kidneys are normal in size. No left renal mass or hydronephrosis. No left-sided nephrolithiasis. There is right-sided nephrolithiasis with a 1.6 cm stone over the upper pole. There are a few right renal cysts unchanged. Mild right-sided hydronephrosis. Mild prominence of the right ureter containing 2 adjacent stones over the distal ureter at and just above the UVJ with the larger just above the UVJ measuring 4 mm and smaller stone at the UVJ measuring 2 mm. Left ureter and bladder are normal. Stomach/Bowel: Stomach and small bowel are normal. Previous appendectomy. Rectal tube is present. There is a redundant sigmoid colon. Mild prominence of the sigmoid colon measuring 6.2 cm in the mid abdomen right of midline. Sigmoid colon proximal and distal to this air-filled segment is contracted. No inflammatory  change or obstruction to suggest volvulus. Vascular/Lymphatic: Calcified plaque over the abdominal aorta which is normal in caliber. No adenopathy. Reproductive: Uterus and right ovary are normal. There is a simple appearing 7.1 cm left ovarian cyst unchanged. Other: Minimal free pelvic fluid. Musculoskeletal: Degenerative change of the spine and hips. IMPRESSION: 1. Right-sided nephrolithiasis. Two adjacent stones over the distal right ureter at and just above the UVJ with the larger measuring 4 mm and smaller stone at the UVJ measuring 2 mm. Low-grade obstruction with mild right-sided hydronephrosis. 2. Right renal cysts unchanged. 3. Stable 7.1 cm simple appearing left ovarian cyst. Recommend pelvic ultrasound for further evaluation. 4. Possible minimal layering sludge versus noncalcified gallstones. 5. Aortic atherosclerosis. Atherosclerotic coronary artery disease. Mild cardiomegaly with small pericardial effusion. 6. Small bilateral pleural effusions with associated bibasilar atelectasis. 7. Possible 1.3 cm right adrenal nodule likely adenoma. Recommend follow-up adrenal CT 1 year. Aortic Atherosclerosis (ICD10-I70.0). Electronically Signed   By: Marin Olp M.D.   On: 02/05/2020 17:49    Impression: 64 year old female with numerous comorbidities, admitted with severe sepsis in setting of UTI, bacteremia, acute renal injury, severe hypokalemia in setting of chronic diarrhea. CT with findings of nephrolithiasis to right kidney and mild hydronephrosis and plans for stent placement this afternoon. GI consulted due to ongoing diarrhea.   Initially, abdominal xray showed marked colonic distension. CT abd/pelvis with contrast showed redundant sigmoid colon, mild prominence of sigmoid measuring 6.2 cm in mid abdomen right of midline, sigmoid proximal and distal to this is contracted; no inflammatory changes or obstruction suggesting volvulus.   Reviewed history with husband over the phone, and it appears  Kerry [name] has been dealing with postprandial diarrhea dating back to time of stroke in 2016. Cdiff negative, and GI pathogen panel pending. Flexi-seal rectal tube in place, with 50 ml output today and 150 ml yesterday per nursing staff. Kerry [name] has never had a formal GI evaluation and would benefit from a colonoscopy once recovered from acute illness. In interim, recommend supportive measures. Will check TSH and celiac serologies now to have on file. Once GI pathogen panel resulted, can trial an agent such as Lomotil, Imodium, etc.   Plan: Check TSH, celiac serologies  Follow-up on pending GI pathogen panel Supportive measures Colonoscopy once over acute illness with random colonic biopsies Will reassess in the morning   Annitta Needs, PhD, ANP-BC [facility]      LOS: 4 days    02/06/2020, 1:23 PM   Agree with the above findings as written. The patient was presented to me by the APP (Advanced Practice Provider) and I have also seen and examined the patient independently. Please see my  key portion of the encounter as documented.  Patient is a 64 year old female with multiple comorbidities who was admitted to [facility] with  severe sepsis likely due to UTI, found to have bacteremia as well as acute kidney injury.  Kerry [name] notes chronic diarrhea for multiple years as well as recurrent hypokalemia.  CT abdomen pelvis which I personally reviewed myself showed mild prominence of the sigmoid colon.  C. difficile is negative.  GI panel PCR is pending at this time.  Kerry [name] does have a rectal tube in place with 50 mL output today and 150 mL output yesterday per nursing staff.  Kerry [name] has never undergone GI evaluation in the past.    Agree with supportive measures at this time.  If her GI panel PCR is negative, can trial on antidiarrheals.  I would start with Imodium.  Continue to replace potassium as warranted.  Kerry [name] certainly warrants colonoscopy to rule out microscopic colitis though I think Kerry [name]  is too sick at this juncture to pursue this.  It may be better to have this done in the outpatient setting when Kerry [name] recovers from her acute illness.  We will continue to follow.

## 2020-02-06 NOTE — Transfer of Care (Signed)
Immediate Anesthesia Transfer of Care Note  Patient: Kerry [name]  Procedure(s) Performed: CYSTOSCOPY WITH RETROGRADE PYELOGRAM/URETERAL STENT PLACEMENT (Right )  Patient Location: PACU  Anesthesia Type:General  Level of Consciousness: drowsy and patient cooperative  Airway & Oxygen Therapy: Patient Spontanous Breathing and Patient connected to tracheostomy mask oxygen  Post-op Assessment: Report given to RN and Post -op Vital signs reviewed and stable  Post vital signs: Reviewed and stable  Last Vitals:  Vitals Value Taken Time  BP 94/57 02/06/20 2015  Temp    Pulse 78 02/06/20 2017  Resp 23 02/06/20 2017  SpO2 94 % 02/06/20 2017  Vitals shown include unvalidated device data.  Last Pain:  Vitals:   02/06/20 1424  TempSrc: Oral  PainSc:       Patients Stated Pain Goal: 0 (46/43/14 2767)  Complications: No complications documented.

## 2020-02-06 NOTE — Progress Notes (Signed)
Patient transferred to [facility] for urinary stent. To be transferred back to [facility] post op.

## 2020-02-06 NOTE — Progress Notes (Signed)
Patient" tracheostomy tube has no cuff. Risk of aspiration is high with uncuffed tube, She is on high levels of oxygen and may not tolerate the sedation. Discussed with Dr. Alyson Ingles, will transfer to the facility where they can do the procedure safely with appropriate equipment and personnel.

## 2020-02-06 NOTE — H&P (View-Only) (Signed)
Urology Consult  Referring physician: Dr. Manuella Ghazi Reason for referral: right ureteral calculus  Chief Complaint: Right flank pain  History of Present Illness: Kerry [name] is a 64yo with a hx of DMII, CVA, CAD admitted with sepsis from a UTI. She is currently on broad spectrum antibiotics for Klebsiella. She is improving on IV antibiotics and is currently having low grade fevers. She was having diffuse abdominal pain for 1 week which was mild but worsened yesterday. CT stone study obtained which showed 2 right distal ureteral calculi and mild hydronephrosis. This is her first stone event.   Past Medical History:  Diagnosis Date  . Breast cancer ([place]) 03/10/2007   Left breat  . Carpal tunnel syndrome on right   . Coronary artery disease   . Degenerative disc disease, cervical   . Degenerative disc disease, lumbar   . Diabetes mellitus   . Elevated troponin   . Fibromyalgia   . GIB (gastrointestinal bleeding)   . HTN (hypertension) 06/13/2015  . Hyperlipidemia   . Hypertension   . PE (pulmonary embolism)   . 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  . Stroke [facility]) 2009   paralyzed on right side  . UTI (lower urinary tract infection)    Past Surgical History:  Procedure Laterality Date  . APPENDECTOMY    . BREAST SURGERY    . CARDIAC CATHETERIZATION  2005  . CARDIAC CATHETERIZATION N/A 04/13/2015   Procedure: Temporary Pacemaker;  Surgeon: Peter M Martinique, MD;  Location: [facility];  Service: Cardiovascular;  Laterality: N/A;  . CARDIAC SURGERY    . ESOPHAGOGASTRODUODENOSCOPY N/A 04/18/2015   Procedure: ESOPHAGOGASTRODUODENOSCOPY (EGD);  Surgeon: Judeth Horn, MD;  Location: [facility];  Service: General;  Laterality: N/A;  . IR GASTROSTOMY TUBE REMOVAL  11/05/2016  . MASTECTOMY Left 03/2007  . PEG PLACEMENT N/A 04/18/2015   Procedure: PERCUTANEOUS ENDOSCOPIC GASTROSTOMY (PEG) PLACEMENT;  Surgeon: Judeth Horn, MD;  Location: [facility];  Service: General;  Laterality: N/A;  bedside  . RADIOLOGY WITH ANESTHESIA N/A 04/11/2015   Procedure: RADIOLOGY WITH ANESTHESIA;  Surgeon: Luanne Bras, MD;  Location: [facility];  Service: Radiology;  Laterality: N/A;  . TUBAL LIGATION Right   . VEIN SURGERY Right 2005    Medications: I have reviewed the patient's current medications. Allergies:  Allergies  Allergen Reactions  . Codeine Nausea Only    Nausea?-- "made her sick"  . Oxycodone Nausea Only    Sick   . Oxycontin [Oxycodone Hcl] Nausea Only    Sick     Family History  Problem Relation Age of Onset  . Cancer Mother        Breast cancer (right)  . Cancer Maternal Aunt        Breast cancer  . Cancer Maternal Aunt        Breast cancer  . Cancer Cousin        Breast Cancer  . Diabetes Sister   . Colon cancer Neg Hx    Social History:  reports that she quit smoking about 19 years ago. She smoked 0.25 packs per day. She has never used smokeless tobacco. She reports current alcohol use. She reports that she does not use drugs.  Review of Systems  Physical Exam:  Vital signs in last 24 hours: Temp:  [98.3 F (36.8 C)-99.2 F (37.3 C)] 98.3 F (36.8 C) (08/16 0400) Pulse Rate:  [73-88] 86 (08/16 1126) Resp:  [15-29] 23 (  08/16 1126) BP: (113-154)/(49-78) 136/60 (08/16 1126) SpO2:  [93 %-99 %] 97 % (08/16 1126) FiO2 (%):  [35 %] 35 % (08/16 1126) Physical Exam  Laboratory Data:  Results for orders placed or performed during the hospital encounter of 02/02/20 (from the past 72 hour(s))  Potassium     Status: Abnormal   Collection Time: 02/03/20  3:37 PM  Result Value Ref Range   Potassium 3.1 (L) 3.5 - 5.1 mmol/L    Comment: Performed at [facility], [address], [place], [place] [address]  Glucose, capillary     Status: Abnormal   Collection Time: 02/03/20  4:25 PM  Result Value Ref Range   Glucose-Capillary 166 (H) 70 - 99 mg/dL    Comment: Glucose reference range applies only to samples  taken after fasting for at least 8 hours.  CBC     Status: Abnormal   Collection Time: 02/04/20  6:34 AM  Result Value Ref Range   WBC 10.7 (H) 4.0 - 10.5 K/uL   RBC 4.68 3.87 - 5.11 MIL/uL   Hemoglobin 12.4 12.0 - 15.0 g/dL   HCT 38.8 36 - 46 %   MCV 82.9 80.0 - 100.0 fL   MCH 26.5 26.0 - 34.0 pg   MCHC 32.0 30.0 - 36.0 g/dL   RDW 14.6 11.5 - 15.5 %   Platelets 86 (L) 150 - 400 K/uL    Comment: REPEATED TO VERIFY SPECIMEN CHECKED FOR CLOTS CONSISTENT WITH PREVIOUS RESULT    nRBC 0.0 0.0 - 0.2 %    Comment: Performed at [facility], [address], [place], [place] [address]  Basic metabolic panel     Status: Abnormal   Collection Time: 02/04/20  6:34 AM  Result Value Ref Range   Sodium 140 135 - 145 mmol/L   Potassium 2.5 (LL) 3.5 - 5.1 mmol/L    Comment: CRITICAL RESULT CALLED TO, READ BACK BY AND VERIFIED WITH: MCMILLIAN B. @ 0623 ON [id number] BY HENDERSON L.    Chloride 105 98 - 111 mmol/L   CO2 24 22 - 32 mmol/L   Glucose, Bld 96 70 - 99 mg/dL    Comment: Glucose reference range applies only to samples taken after fasting for at least 8 hours.   BUN 13 8 - 23 mg/dL   Creatinine, Ser 0.92 0.44 - 1.00 mg/dL    Comment: DELTA CHECK NOTED   Calcium 7.7 (L) 8.9 - 10.3 mg/dL   GFR calc non Af Amer >60 >60 mL/min   GFR calc Af Amer >60 >60 mL/min   Anion gap 11 5 - 15    Comment: Performed at [facility], [address], [place], [place] [address]  Lactic acid, plasma     Status: None   Collection Time: 02/04/20  6:34 AM  Result Value Ref Range   Lactic Acid, Venous 1.0 0.5 - 1.9 mmol/L    Comment: Performed at [facility], [address] [place], [place] [address]  Magnesium     Status: Abnormal   Collection Time: 02/04/20  6:34 AM  Result Value Ref Range   Magnesium 1.4 (L) 1.7 - 2.4 mg/dL    Comment: Performed at [facility], [address], [place], [place] [address]  Glucose, capillary     Status: None   Collection Time: 02/04/20  8:05 AM  Result Value Ref Range    Glucose-Capillary 96 70 - 99 mg/dL    Comment: Glucose reference range applies only to samples taken after fasting for  at least 8 hours.  Glucose, capillary     Status: Abnormal   Collection Time: 02/04/20 11:34 AM  Result Value Ref Range   Glucose-Capillary 173 (H) 70 - 99 mg/dL    Comment: Glucose reference range applies only to samples taken after fasting for at least 8 hours.  Potassium     Status: Abnormal   Collection Time: 02/04/20  4:32 PM  Result Value Ref Range   Potassium 2.8 (L) 3.5 - 5.1 mmol/L    Comment: Performed at [facility], [address], [place], [place] [address]  Glucose, capillary     Status: Abnormal   Collection Time: 02/04/20  4:36 PM  Result Value Ref Range   Glucose-Capillary 159 (H) 70 - 99 mg/dL    Comment: Glucose reference range applies only to samples taken after fasting for at least 8 hours.  Glucose, capillary     Status: Abnormal   Collection Time: 02/04/20  9:55 PM  Result Value Ref Range   Glucose-Capillary 135 (H) 70 - 99 mg/dL    Comment: Glucose reference range applies only to samples taken after fasting for at least 8 hours.  Basic metabolic panel     Status: Abnormal   Collection Time: 02/05/20  5:56 AM  Result Value Ref Range   Sodium 136 135 - 145 mmol/L   Potassium 3.4 (L) 3.5 - 5.1 mmol/L   Chloride 103 98 - 111 mmol/L   CO2 25 22 - 32 mmol/L   Glucose, Bld 166 (H) 70 - 99 mg/dL    Comment: Glucose reference range applies only to samples taken after fasting for at least 8 hours.   BUN 8 8 - 23 mg/dL   Creatinine, Ser 0.73 0.44 - 1.00 mg/dL   Calcium 7.7 (L) 8.9 - 10.3 mg/dL   GFR calc non Af Amer >60 >60 mL/min   GFR calc Af Amer >60 >60 mL/min   Anion gap 8 5 - 15    Comment: Performed at [facility], [address] [place], [place] [address]  CBC     Status: Abnormal   Collection Time: 02/05/20  5:56 AM  Result Value Ref Range   WBC 8.4 4.0 - 10.5 K/uL   RBC 4.33 3.87 - 5.11 MIL/uL   Hemoglobin 11.4 (L) 12.0 - 15.0 g/dL    HCT 36.2 36 - 46 %   MCV 83.6 80.0 - 100.0 fL   MCH 26.3 26.0 - 34.0 pg   MCHC 31.5 30.0 - 36.0 g/dL   RDW 14.4 11.5 - 15.5 %   Platelets 89 (L) 150 - 400 K/uL    Comment: REPEATED TO VERIFY SPECIMEN CHECKED FOR CLOTS Immature Platelet Fraction may be clinically indicated, consider ordering this additional test [id number] CONSISTENT WITH PREVIOUS RESULT    nRBC 0.0 0.0 - 0.2 %    Comment: Performed at [facility], [address] [place], [place] [address]  Magnesium     Status: Abnormal   Collection Time: 02/05/20  5:56 AM  Result Value Ref Range   Magnesium 1.6 (L) 1.7 - 2.4 mg/dL    Comment: Performed at [facility], [address], [place], [place] [address]  Glucose, capillary     Status: Abnormal   Collection Time: 02/05/20  7:23 AM  Result Value Ref Range   Glucose-Capillary 154 (H) 70 - 99 mg/dL    Comment: Glucose reference range applies only to samples taken after fasting for at least 8 hours.  C Difficile Quick Screen (NO  PCR Reflex)     Status: None   Collection Time: 02/05/20  9:32 AM   Specimen: STOOL  Result Value Ref Range   C Diff antigen NEGATIVE NEGATIVE   C Diff toxin NEGATIVE NEGATIVE   C Diff interpretation No C. difficile detected.     Comment: Performed at [facility], [address], [place], [place] [address]  Glucose, capillary     Status: Abnormal   Collection Time: 02/05/20 11:25 AM  Result Value Ref Range   Glucose-Capillary 242 (H) 70 - 99 mg/dL    Comment: Glucose reference range applies only to samples taken after fasting for at least 8 hours.  Glucose, capillary     Status: Abnormal   Collection Time: 02/05/20  4:36 PM  Result Value Ref Range   Glucose-Capillary 163 (H) 70 - 99 mg/dL    Comment: Glucose reference range applies only to samples taken after fasting for at least 8 hours.  Glucose, capillary     Status: Abnormal   Collection Time: 02/05/20 10:57 PM  Result Value Ref Range   Glucose-Capillary 202 (H) 70 - 99 mg/dL     Comment: Glucose reference range applies only to samples taken after fasting for at least 8 hours.   Comment 1 Notify RN    Comment 2 Document in Chart   Basic metabolic panel     Status: Abnormal   Collection Time: 02/06/20  4:46 AM  Result Value Ref Range   Sodium 135 135 - 145 mmol/L   Potassium 3.3 (L) 3.5 - 5.1 mmol/L   Chloride 99 98 - 111 mmol/L   CO2 28 22 - 32 mmol/L   Glucose, Bld 240 (H) 70 - 99 mg/dL    Comment: Glucose reference range applies only to samples taken after fasting for at least 8 hours.   BUN 8 8 - 23 mg/dL   Creatinine, Ser 0.62 0.44 - 1.00 mg/dL   Calcium 8.1 (L) 8.9 - 10.3 mg/dL   GFR calc non Af Amer >60 >60 mL/min   GFR calc Af Amer >60 >60 mL/min   Anion gap 8 5 - 15    Comment: Performed at [facility], [address], [place], [place] [address]  Magnesium     Status: Abnormal   Collection Time: 02/06/20  4:46 AM  Result Value Ref Range   Magnesium 1.4 (L) 1.7 - 2.4 mg/dL    Comment: Performed at [facility], [address] [place],  [address]  CBC     Status: Abnormal   Collection Time: 02/06/20  4:46 AM  Result Value Ref Range   WBC 7.7 4.0 - 10.5 K/uL   RBC 4.36 3.87 - 5.11 MIL/uL   Hemoglobin 11.5 (L) 12.0 - 15.0 g/dL   HCT 36.5 36 - 46 %   MCV 83.7 80.0 - 100.0 fL   MCH 26.4 26.0 - 34.0 pg   MCHC 31.5 30.0 - 36.0 g/dL   RDW 14.1 11.5 - 15.5 %   Platelets 102 (L) 150 - 400 K/uL    Comment: REPEATED TO VERIFY PLATELET COUNT CONFIRMED BY SMEAR SPECIMEN CHECKED FOR CLOTS    nRBC 0.0 0.0 - 0.2 %    Comment: Performed at [facility], [address], [place],  [address]  Glucose, capillary     Status: Abnormal   Collection Time: 02/06/20  7:21 AM  Result Value Ref Range   Glucose-Capillary 221 (H) 70 - 99 mg/dL    Comment: Glucose reference range applies  only to samples taken after fasting for at least 8 hours.   Comment 1 Notify RN    Comment 2 Document in Chart   Glucose, capillary     Status: Abnormal   Collection  Time: 02/06/20 12:03 PM  Result Value Ref Range   Glucose-Capillary 152 (H) 70 - 99 mg/dL    Comment: Glucose reference range applies only to samples taken after fasting for at least 8 hours.   Recent Results (from the past 240 hour(s))  Culture, blood (Routine x 2)     Status: Abnormal   Collection Time: 02/02/20 11:41 AM   Specimen: BLOOD  Result Value Ref Range Status   Specimen Description   Final    BLOOD LEFT ANTECUBITAL Performed at [facility], [address] [place], [place] [address]    Special Requests   Final    BOTTLES DRAWN AEROBIC AND ANAEROBIC Blood Culture adequate volume Performed at [place] [place], [place] [address]    Culture  Setup Time   Final    GRAM NEGATIVE RODS Gram Stain Report Called to,Read Back By and Verified With: HEARN,J AT 0703 ON 02/03/20 BY HUFFINES,S. IN BOTH AEROBIC AND ANAEROBIC BOTTLES Gram Stain Report Called to,Read Back By and Verified With: PREVIOUSLY CALLED @[id number]  08/13/2021KAY CRITICAL RESULT CALLED TO, READ BACK BY AND VERIFIED WITH: PHARMD Alison Stalling 1402 [id number] FCP Performed at [facility], [address] [place], [place] [address]    Culture KLEBSIELLA PNEUMONIAE PROTEUS MIRABILIS  (A)  Final   Report Status 02/05/2020 FINAL  Final   Organism ID, Bacteria KLEBSIELLA PNEUMONIAE  Final   Organism ID, Bacteria PROTEUS MIRABILIS  Final      Susceptibility   Klebsiella pneumoniae - MIC*    AMPICILLIN RESISTANT Resistant     CEFAZOLIN <=4 SENSITIVE Sensitive     CEFEPIME <=0.12 SENSITIVE Sensitive     CEFTAZIDIME <=1 SENSITIVE Sensitive     CEFTRIAXONE <=0.25 SENSITIVE Sensitive     CIPROFLOXACIN <=0.25 SENSITIVE Sensitive     GENTAMICIN <=1 SENSITIVE Sensitive     IMIPENEM <=0.25 SENSITIVE Sensitive     TRIMETH/SULFA >=320 RESISTANT Resistant     AMPICILLIN/SULBACTAM 4 SENSITIVE Sensitive     PIP/TAZO <=4 SENSITIVE Sensitive     * KLEBSIELLA PNEUMONIAE   Proteus mirabilis - MIC*    AMPICILLIN  <=2 SENSITIVE Sensitive     CEFAZOLIN 8 SENSITIVE Sensitive     CEFEPIME <=0.12 SENSITIVE Sensitive     CEFTAZIDIME <=1 SENSITIVE Sensitive     CEFTRIAXONE <=0.25 SENSITIVE Sensitive     CIPROFLOXACIN <=0.25 SENSITIVE Sensitive     GENTAMICIN <=1 SENSITIVE Sensitive     IMIPENEM 4 SENSITIVE Sensitive     TRIMETH/SULFA <=20 SENSITIVE Sensitive     AMPICILLIN/SULBACTAM <=2 SENSITIVE Sensitive     PIP/TAZO <=4 SENSITIVE Sensitive     * PROTEUS MIRABILIS  Blood Culture ID Panel (Reflexed)     Status: Abnormal   Collection Time: 02/02/20 11:41 AM  Result Value Ref Range Status   Enterococcus faecalis NOT DETECTED NOT DETECTED Final   Enterococcus Faecium NOT DETECTED NOT DETECTED Final   Listeria monocytogenes NOT DETECTED NOT DETECTED Final   Staphylococcus species NOT DETECTED NOT DETECTED Final   Staphylococcus aureus (BCID) NOT DETECTED NOT DETECTED Final   Staphylococcus epidermidis NOT DETECTED NOT DETECTED Final   Staphylococcus lugdunensis NOT DETECTED NOT DETECTED Final   Streptococcus species NOT DETECTED NOT DETECTED Final   Streptococcus agalactiae NOT DETECTED NOT  DETECTED Final   Streptococcus pneumoniae NOT DETECTED NOT DETECTED Final   Streptococcus pyogenes NOT DETECTED NOT DETECTED Final   A.calcoaceticus-baumannii NOT DETECTED NOT DETECTED Final   Bacteroides fragilis NOT DETECTED NOT DETECTED Final   Enterobacterales DETECTED (A) NOT DETECTED Final    Comment: Enterobacterales represent a large order of gram negative bacteria, not a single organism. CRITICAL RESULT CALLED TO, READ BACK BY AND VERIFIED WITH: PHARMD GARRETT C. 1402 [id number] FCP    Enterobacter cloacae complex NOT DETECTED NOT DETECTED Final   Escherichia coli NOT DETECTED NOT DETECTED Final   Klebsiella aerogenes NOT DETECTED NOT DETECTED Final   Klebsiella oxytoca NOT DETECTED NOT DETECTED Final   Klebsiella pneumoniae DETECTED (A) NOT DETECTED Final    Comment: CRITICAL RESULT CALLED TO, READ BACK  BY AND VERIFIED WITH: PHARMD GARRETT C. 1402 [id number] FCP    Proteus species NOT DETECTED NOT DETECTED Final   Salmonella species NOT DETECTED NOT DETECTED Final   Serratia marcescens NOT DETECTED NOT DETECTED Final   Haemophilus influenzae NOT DETECTED NOT DETECTED Final   Neisseria meningitidis NOT DETECTED NOT DETECTED Final   Pseudomonas aeruginosa NOT DETECTED NOT DETECTED Final   Stenotrophomonas maltophilia NOT DETECTED NOT DETECTED Final   Candida albicans NOT DETECTED NOT DETECTED Final   Candida auris NOT DETECTED NOT DETECTED Final   Candida glabrata NOT DETECTED NOT DETECTED Final   Candida krusei NOT DETECTED NOT DETECTED Final   Candida parapsilosis NOT DETECTED NOT DETECTED Final   Candida tropicalis NOT DETECTED NOT DETECTED Final   Cryptococcus neoformans/gattii NOT DETECTED NOT DETECTED Final   CTX-M ESBL NOT DETECTED NOT DETECTED Final   Carbapenem resistance IMP NOT DETECTED NOT DETECTED Final   Carbapenem resistance KPC NOT DETECTED NOT DETECTED Final   Carbapenem resistance NDM NOT DETECTED NOT DETECTED Final   Carbapenem resist OXA 48 LIKE NOT DETECTED NOT DETECTED Final   Carbapenem resistance VIM NOT DETECTED NOT DETECTED Final    Comment: Performed at [facility], [address], [place], [place] [address]  SARS Coronavirus 2 by RT PCR (hospital order, performed in [facility] hospital lab) Nasopharyngeal Nasopharyngeal Swab     Status: None   Collection Time: 02/02/20 12:02 PM   Specimen: Nasopharyngeal Swab  Result Value Ref Range Status   SARS Coronavirus 2 NEGATIVE NEGATIVE Final    Comment: (NOTE) SARS-CoV-2 target nucleic acids are NOT DETECTED.  The SARS-CoV-2 RNA is generally detectable in upper and lower respiratory specimens during the acute phase of infection. The lowest concentration of SARS-CoV-2 viral copies this assay can detect is 250 copies / mL. A negative result does not preclude SARS-CoV-2 infection and should not be used as the  sole basis for treatment or other patient management decisions.  A negative result may occur with improper specimen collection / handling, submission of specimen other than nasopharyngeal swab, presence of viral mutation(s) within the areas targeted by this assay, and inadequate number of viral copies (<250 copies / mL). A negative result must be combined with clinical observations, patient history, and epidemiological information.  Fact Sheet for Patients:   StrictlyIdeas.no  Fact Sheet for Healthcare Providers: BankingDealers.co.za  This test is not yet approved or  cleared by the [place] FDA and has been authorized for detection and/or diagnosis of SARS-CoV-2 by FDA under an Emergency Use Authorization (EUA).  This EUA will remain in effect (meaning this test can be used) for the duration of the COVID-19 declaration under Section 564(b)(1) of the  Act, 21 U.S.C. section 360bbb-3(b)(1), unless the authorization is terminated or revoked sooner.  Performed at [facility], [address] [place], [place] [address]   Urine culture     Status: Abnormal   Collection Time: 02/02/20 12:04 PM   Specimen: In/Out Cath Urine  Result Value Ref Range Status   Specimen Description   Final    IN/OUT CATH URINE Performed at [facility], [address], [place], [place] [address]    Special Requests   Final    NONE Performed at [facility], [address], [place], [place] [address]    Culture (A)  Final    >=100,000 COLONIES/mL KLEBSIELLA PNEUMONIAE >=100,000 COLONIES/mL PROTEUS MIRABILIS    Report Status 02/04/2020 FINAL  Final   Organism ID, Bacteria KLEBSIELLA PNEUMONIAE (A)  Final   Organism ID, Bacteria PROTEUS MIRABILIS (A)  Final      Susceptibility   Klebsiella pneumoniae - MIC*    AMPICILLIN RESISTANT Resistant     CEFAZOLIN <=4 SENSITIVE Sensitive     CEFTRIAXONE <=0.25 SENSITIVE Sensitive     CIPROFLOXACIN <=0.25  SENSITIVE Sensitive     GENTAMICIN <=1 SENSITIVE Sensitive     IMIPENEM <=0.25 SENSITIVE Sensitive     NITROFURANTOIN 64 INTERMEDIATE Intermediate     TRIMETH/SULFA >=320 RESISTANT Resistant     AMPICILLIN/SULBACTAM 4 SENSITIVE Sensitive     PIP/TAZO <=4 SENSITIVE Sensitive     * >=100,000 COLONIES/mL KLEBSIELLA PNEUMONIAE   Proteus mirabilis - MIC*    AMPICILLIN <=2 SENSITIVE Sensitive     CEFAZOLIN 8 SENSITIVE Sensitive     CEFTRIAXONE <=0.25 SENSITIVE Sensitive     CIPROFLOXACIN <=0.25 SENSITIVE Sensitive     GENTAMICIN <=1 SENSITIVE Sensitive     IMIPENEM 4 SENSITIVE Sensitive     NITROFURANTOIN 128 RESISTANT Resistant     TRIMETH/SULFA <=20 SENSITIVE Sensitive     AMPICILLIN/SULBACTAM <=2 SENSITIVE Sensitive     PIP/TAZO <=4 SENSITIVE Sensitive     * >=100,000 COLONIES/mL PROTEUS MIRABILIS  Culture, blood (Routine x 2)     Status: None (Preliminary result)   Collection Time: 02/02/20 12:34 PM   Specimen: BLOOD  Result Value Ref Range Status   Specimen Description BLOOD RIGHT HAND  Final   Special Requests   Final    BOTTLES DRAWN AEROBIC AND ANAEROBIC Blood Culture adequate volume   Culture   Final    NO GROWTH 4 DAYS Performed at [facility], [address] [place], [place] [address]    Report Status PENDING  Incomplete  MRSA PCR Screening     Status: None   Collection Time: 02/03/20  1:41 AM   Specimen: Nasal Mucosa; Nasopharyngeal  Result Value Ref Range Status   MRSA by PCR NEGATIVE NEGATIVE Final    Comment:        The GeneXpert MRSA Assay (FDA approved for NASAL specimens only), is one component of a comprehensive MRSA colonization surveillance program. It is not intended to diagnose MRSA infection nor to guide or monitor treatment for MRSA infections. Performed at [facility], [address], [place],  [address]   C Difficile Quick Screen (NO PCR Reflex)     Status: None   Collection Time: 02/05/20  9:32 AM   Specimen: STOOL  Result Value Ref  Range Status   C Diff antigen NEGATIVE NEGATIVE Final   C Diff toxin NEGATIVE NEGATIVE Final   C Diff interpretation No C. difficile detected.  Final    Comment: Performed at [facility],  [address], [place], [place] [address]   Creatinine: Recent Labs    02/02/20 1125 02/03/20 0317 02/04/20 0634 02/05/20 0556 02/06/20 0446  CREATININE 4.65* 2.32* 0.92 0.73 0.62   Baseline Creatinine: 0.6  Impression/Assessment:  63yo with right ureteral calculus, sepsis from a urinary source  Plan:  -We discussed the management of kidney stones. These options include observation, ureteroscopy, shockwave lithotripsy (ESWL) and percutaneous nephrolithotomy (PCNL). We discussed which options are relevant to the patient's stone(s). We discussed the natural history of kidney stones as well as the complications of untreated stones and the impact on quality of life without treatment as well as with each of the above listed treatments. We also discussed the efficacy of each treatment in its ability to clear the stone burden. With any of these management options I discussed the signs and symptoms of infection and the need for emergent treatment should these be experienced. For each option we discussed the ability of each procedure to clear the patient of their stone burden.   For observation I described the risks which include but are not limited to silent renal damage, life-threatening infection, need for emergent surgery, failure to pass stone and pain.   For ureteroscopy I described the risks which include bleeding, infection, damage to contiguous structures, positioning injury, ureteral stricture, ureteral avulsion, ureteral injury, need for prolonged ureteral stent, inability to perform ureteroscopy, need for an interval procedure, inability to clear stone burden, stent discomfort/pain, heart attack, stroke, pulmonary embolus and the inherent risks with general anesthesia.   For shockwave lithotripsy I  described the risks which include arrhythmia, kidney contusion, kidney hemorrhage, need for transfusion, pain, inability to adequately break up stone, inability to pass stone fragments, Steinstrasse, infection associated with obstructing stones, need for alternate surgical procedure, need for repeat shockwave lithotripsy, MI, CVA, PE and the inherent risks with anesthesia/conscious sedation.   For PCNL I described the risks including positioning injury, pneumothorax, hydrothorax, need for chest tube, inability to clear stone burden, renal laceration, arterial venous fistula or malformation, need for embolization of kidney, loss of kidney or renal function, need for repeat procedure, need for prolonged nephrostomy tube, ureteral avulsion, MI, CVA, PE and the inherent risks of general anesthesia.   - The patient would like to proceed with right ureteral stent placement   Nicolette Bang 02/06/2020, 1:45 PM

## 2020-02-06 NOTE — Consult Note (Signed)
Urology Consult  Referring physician: Dr. Manuella Ghazi Reason for referral: right ureteral calculus  Chief Complaint: Right flank pain  History of Present Illness: Kerry [name] is a 64yo with a hx of DMII, CVA, CAD admitted with sepsis from a UTI. She is currently on broad spectrum antibiotics for Klebsiella. She is improving on IV antibiotics and is currently having low grade fevers. She was having diffuse abdominal pain for 1 week which was mild but worsened yesterday. CT stone study obtained which showed 2 right distal ureteral calculi and mild hydronephrosis. This is her first stone event.   Past Medical History:  Diagnosis Date  . Breast cancer ([place]) 03/10/2007   Left breat  . Carpal tunnel syndrome on right   . Coronary artery disease   . Degenerative disc disease, cervical   . Degenerative disc disease, lumbar   . Diabetes mellitus   . Elevated troponin   . Fibromyalgia   . GIB (gastrointestinal bleeding)   . HTN (hypertension) 06/13/2015  . Hyperlipidemia   . Hypertension   . PE (pulmonary embolism)   . 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  . Stroke [facility]) 2009   paralyzed on right side  . UTI (lower urinary tract infection)    Past Surgical History:  Procedure Laterality Date  . APPENDECTOMY    . BREAST SURGERY    . CARDIAC CATHETERIZATION  2005  . CARDIAC CATHETERIZATION N/A 04/13/2015   Procedure: Temporary Pacemaker;  Surgeon: Peter M Martinique, MD;  Location: [place] [facility];  Service: Cardiovascular;  Laterality: N/A;  . CARDIAC SURGERY    . ESOPHAGOGASTRODUODENOSCOPY N/A 04/18/2015   Procedure: ESOPHAGOGASTRODUODENOSCOPY (EGD);  Surgeon: Judeth Horn, MD;  Location: [place];  Service: General;  Laterality: N/A;  . IR GASTROSTOMY TUBE REMOVAL  11/05/2016  . MASTECTOMY Left 03/2007  . PEG PLACEMENT N/A 04/18/2015   Procedure: PERCUTANEOUS ENDOSCOPIC GASTROSTOMY (PEG) PLACEMENT;  Surgeon: Judeth Horn, MD;  Location: [place] [facility];  Service: General;  Laterality: N/A;  bedside  . RADIOLOGY WITH ANESTHESIA N/A 04/11/2015   Procedure: RADIOLOGY WITH ANESTHESIA;  Surgeon: Luanne Bras, MD;  Location: [place];  Service: Radiology;  Laterality: N/A;  . TUBAL LIGATION Right   . VEIN SURGERY Right 2005    Medications: I have reviewed the patient's current medications. Allergies:  Allergies  Allergen Reactions  . Codeine Nausea Only    Nausea?-- "made her sick"  . Oxycodone Nausea Only    Sick   . Oxycontin [Oxycodone Hcl] Nausea Only    Sick     Family History  Problem Relation Age of Onset  . Cancer Mother        Breast cancer (right)  . Cancer Maternal Aunt        Breast cancer  . Cancer Maternal Aunt        Breast cancer  . Cancer Cousin        Breast Cancer  . Diabetes Sister   . Colon cancer Neg Hx    Social History:  reports that she quit smoking about 19 years ago. She smoked 0.25 packs per day. She has never used smokeless tobacco. She reports current alcohol use. She reports that she does not use drugs.  Review of Systems  Physical Exam:  Vital signs in last 24 hours: Temp:  [98.3 F (36.8 C)-99.2 F (37.3 C)] 98.3 F (36.8 C) (08/16 0400) Pulse Rate:  [73-88] 86 (08/16 1126) Resp:  [15-29] 23 (  08/16 1126) BP: (113-154)/(49-78) 136/60 (08/16 1126) SpO2:  [93 %-99 %] 97 % (08/16 1126) FiO2 (%):  [35 %] 35 % (08/16 1126) Physical Exam  Laboratory Data:  Results for orders placed or performed during the hospital encounter of 02/02/20 (from the past 72 hour(s))  Potassium     Status: Abnormal   Collection Time: 02/03/20  3:37 PM  Result Value Ref Range   Potassium 3.1 (L) 3.5 - 5.1 mmol/L    Comment: Performed at [facility], [address], [place], [place] [address]  Glucose, capillary     Status: Abnormal   Collection Time: 02/03/20  4:25 PM  Result Value Ref Range   Glucose-Capillary 166 (H) 70 - 99 mg/dL    Comment: Glucose reference range applies only to samples  taken after fasting for at least 8 hours.  CBC     Status: Abnormal   Collection Time: 02/04/20  6:34 AM  Result Value Ref Range   WBC 10.7 (H) 4.0 - 10.5 K/uL   RBC 4.68 3.87 - 5.11 MIL/uL   Hemoglobin 12.4 12.0 - 15.0 g/dL   HCT 38.8 36 - 46 %   MCV 82.9 80.0 - 100.0 fL   MCH 26.5 26.0 - 34.0 pg   MCHC 32.0 30.0 - 36.0 g/dL   RDW 14.6 11.5 - 15.5 %   Platelets 86 (L) 150 - 400 K/uL    Comment: REPEATED TO VERIFY SPECIMEN CHECKED FOR CLOTS CONSISTENT WITH PREVIOUS RESULT    nRBC 0.0 0.0 - 0.2 %    Comment: Performed at [facility], [address], [place], [place] [address]  Basic metabolic panel     Status: Abnormal   Collection Time: 02/04/20  6:34 AM  Result Value Ref Range   Sodium 140 135 - 145 mmol/L   Potassium 2.5 (LL) 3.5 - 5.1 mmol/L    Comment: CRITICAL RESULT CALLED TO, READ BACK BY AND VERIFIED WITH: MCMILLIAN B. @ [phone] ON [id number] BY HENDERSON L.    Chloride 105 98 - 111 mmol/L   CO2 24 22 - 32 mmol/L   Glucose, Bld 96 70 - 99 mg/dL    Comment: Glucose reference range applies only to samples taken after fasting for at least 8 hours.   BUN 13 8 - 23 mg/dL   Creatinine, Ser 0.92 0.44 - 1.00 mg/dL    Comment: DELTA CHECK NOTED   Calcium 7.7 (L) 8.9 - 10.3 mg/dL   GFR calc non Af Amer >60 >60 mL/min   GFR calc Af Amer >60 >60 mL/min   Anion gap 11 5 - 15    Comment: Performed at [facility], [address] [place], [place] [address]  Lactic acid, plasma     Status: None   Collection Time: 02/04/20  6:34 AM  Result Value Ref Range   Lactic Acid, Venous 1.0 0.5 - 1.9 mmol/L    Comment: Performed at [facility], [address], [place], [place] [address]  Magnesium     Status: Abnormal   Collection Time: 02/04/20  6:34 AM  Result Value Ref Range   Magnesium 1.4 (L) 1.7 - 2.4 mg/dL    Comment: Performed at [facility], [address], [place], [place] [address]  Glucose, capillary     Status: None   Collection Time: 02/04/20  8:05 AM  Result Value Ref Range    Glucose-Capillary 96 70 - 99 mg/dL    Comment: Glucose reference range applies only to samples taken after fasting for  at least 8 hours.  Glucose, capillary     Status: Abnormal   Collection Time: 02/04/20 11:34 AM  Result Value Ref Range   Glucose-Capillary 173 (H) 70 - 99 mg/dL    Comment: Glucose reference range applies only to samples taken after fasting for at least 8 hours.  Potassium     Status: Abnormal   Collection Time: 02/04/20  4:32 PM  Result Value Ref Range   Potassium 2.8 (L) 3.5 - 5.1 mmol/L    Comment: Performed at [facility], [address] [place], [place] [address]  Glucose, capillary     Status: Abnormal   Collection Time: 02/04/20  4:36 PM  Result Value Ref Range   Glucose-Capillary 159 (H) 70 - 99 mg/dL    Comment: Glucose reference range applies only to samples taken after fasting for at least 8 hours.  Glucose, capillary     Status: Abnormal   Collection Time: 02/04/20  9:55 PM  Result Value Ref Range   Glucose-Capillary 135 (H) 70 - 99 mg/dL    Comment: Glucose reference range applies only to samples taken after fasting for at least 8 hours.  Basic metabolic panel     Status: Abnormal   Collection Time: 02/05/20  5:56 AM  Result Value Ref Range   Sodium 136 135 - 145 mmol/L   Potassium 3.4 (L) 3.5 - 5.1 mmol/L   Chloride 103 98 - 111 mmol/L   CO2 25 22 - 32 mmol/L   Glucose, Bld 166 (H) 70 - 99 mg/dL    Comment: Glucose reference range applies only to samples taken after fasting for at least 8 hours.   BUN 8 8 - 23 mg/dL   Creatinine, Ser 0.73 0.44 - 1.00 mg/dL   Calcium 7.7 (L) 8.9 - 10.3 mg/dL   GFR calc non Af Amer >60 >60 mL/min   GFR calc Af Amer >60 >60 mL/min   Anion gap 8 5 - 15    Comment: Performed at [facility], [address] [place], [place] [address]  CBC     Status: Abnormal   Collection Time: 02/05/20  5:56 AM  Result Value Ref Range   WBC 8.4 4.0 - 10.5 K/uL   RBC 4.33 3.87 - 5.11 MIL/uL   Hemoglobin 11.4 (L) 12.0 - 15.0 g/dL    HCT 36.2 36 - 46 %   MCV 83.6 80.0 - 100.0 fL   MCH 26.3 26.0 - 34.0 pg   MCHC 31.5 30.0 - 36.0 g/dL   RDW 14.4 11.5 - 15.5 %   Platelets 89 (L) 150 - 400 K/uL    Comment: REPEATED TO VERIFY SPECIMEN CHECKED FOR CLOTS Immature Platelet Fraction may be clinically indicated, consider ordering this additional test [id number] CONSISTENT WITH PREVIOUS RESULT    nRBC 0.0 0.0 - 0.2 %    Comment: Performed at [facility], [address], [place], [place] [address]  Magnesium     Status: Abnormal   Collection Time: 02/05/20  5:56 AM  Result Value Ref Range   Magnesium 1.6 (L) 1.7 - 2.4 mg/dL    Comment: Performed at [facility], [address], [place],  [address]  Glucose, capillary     Status: Abnormal   Collection Time: 02/05/20  7:23 AM  Result Value Ref Range   Glucose-Capillary 154 (H) 70 - 99 mg/dL    Comment: Glucose reference range applies only to samples taken after fasting for at least 8 hours.  C Difficile Quick Screen (NO  PCR Reflex)     Status: None   Collection Time: 02/05/20  9:32 AM   Specimen: STOOL  Result Value Ref Range   C Diff antigen NEGATIVE NEGATIVE   C Diff toxin NEGATIVE NEGATIVE   C Diff interpretation No C. difficile detected.     Comment: Performed at [facility], [address], [place], [place] [address]  Glucose, capillary     Status: Abnormal   Collection Time: 02/05/20 11:25 AM  Result Value Ref Range   Glucose-Capillary 242 (H) 70 - 99 mg/dL    Comment: Glucose reference range applies only to samples taken after fasting for at least 8 hours.  Glucose, capillary     Status: Abnormal   Collection Time: 02/05/20  4:36 PM  Result Value Ref Range   Glucose-Capillary 163 (H) 70 - 99 mg/dL    Comment: Glucose reference range applies only to samples taken after fasting for at least 8 hours.  Glucose, capillary     Status: Abnormal   Collection Time: 02/05/20 10:57 PM  Result Value Ref Range   Glucose-Capillary 202 (H) 70 - 99 mg/dL     Comment: Glucose reference range applies only to samples taken after fasting for at least 8 hours.   Comment 1 Notify RN    Comment 2 Document in Chart   Basic metabolic panel     Status: Abnormal   Collection Time: 02/06/20  4:46 AM  Result Value Ref Range   Sodium 135 135 - 145 mmol/L   Potassium 3.3 (L) 3.5 - 5.1 mmol/L   Chloride 99 98 - 111 mmol/L   CO2 28 22 - 32 mmol/L   Glucose, Bld 240 (H) 70 - 99 mg/dL    Comment: Glucose reference range applies only to samples taken after fasting for at least 8 hours.   BUN 8 8 - 23 mg/dL   Creatinine, Ser 0.62 0.44 - 1.00 mg/dL   Calcium 8.1 (L) 8.9 - 10.3 mg/dL   GFR calc non Af Amer >60 >60 mL/min   GFR calc Af Amer >60 >60 mL/min   Anion gap 8 5 - 15    Comment: Performed at [facility], [address] [place], [place] [address]  Magnesium     Status: Abnormal   Collection Time: 02/06/20  4:46 AM  Result Value Ref Range   Magnesium 1.4 (L) 1.7 - 2.4 mg/dL    Comment: Performed at [facility], [address], [place], [place] [address]  CBC     Status: Abnormal   Collection Time: 02/06/20  4:46 AM  Result Value Ref Range   WBC 7.7 4.0 - 10.5 K/uL   RBC 4.36 3.87 - 5.11 MIL/uL   Hemoglobin 11.5 (L) 12.0 - 15.0 g/dL   HCT 36.5 36 - 46 %   MCV 83.7 80.0 - 100.0 fL   MCH 26.4 26.0 - 34.0 pg   MCHC 31.5 30.0 - 36.0 g/dL   RDW 14.1 11.5 - 15.5 %   Platelets 102 (L) 150 - 400 K/uL    Comment: REPEATED TO VERIFY PLATELET COUNT CONFIRMED BY SMEAR SPECIMEN CHECKED FOR CLOTS    nRBC 0.0 0.0 - 0.2 %    Comment: Performed at [facility], [address] [place], [place] [address]  Glucose, capillary     Status: Abnormal   Collection Time: 02/06/20  7:21 AM  Result Value Ref Range   Glucose-Capillary 221 (H) 70 - 99 mg/dL    Comment: Glucose reference range applies  only to samples taken after fasting for at least 8 hours.   Comment 1 Notify RN    Comment 2 Document in Chart   Glucose, capillary     Status: Abnormal   Collection  Time: 02/06/20 12:03 PM  Result Value Ref Range   Glucose-Capillary 152 (H) 70 - 99 mg/dL    Comment: Glucose reference range applies only to samples taken after fasting for at least 8 hours.   Recent Results (from the past 240 hour(s))  Culture, blood (Routine x 2)     Status: Abnormal   Collection Time: 02/02/20 11:41 AM   Specimen: BLOOD  Result Value Ref Range Status   Specimen Description   Final    BLOOD LEFT ANTECUBITAL Performed at [facility], [address] [place], [place] [address]    Special Requests   Final    BOTTLES DRAWN AEROBIC AND ANAEROBIC Blood Culture adequate volume Performed at [facility] [place], [place] [address]    Culture  Setup Time   Final    GRAM NEGATIVE RODS Gram Stain Report Called to,Read Back By and Verified With: HEARN,J AT 0703 ON 02/03/20 BY HUFFINES,S. IN BOTH AEROBIC AND ANAEROBIC BOTTLES Gram Stain Report Called to,Read Back By and Verified With: PREVIOUSLY CALLED @[id number]  08/13/2021KAY CRITICAL RESULT CALLED TO, READ BACK BY AND VERIFIED WITH: PHARMD Alison Stalling 1402 [id number] FCP Performed at [facility], [place] [address], [place], [place] [address]    Culture KLEBSIELLA PNEUMONIAE PROTEUS MIRABILIS  (A)  Final   Report Status 02/05/2020 FINAL  Final   Organism ID, Bacteria KLEBSIELLA PNEUMONIAE  Final   Organism ID, Bacteria PROTEUS MIRABILIS  Final      Susceptibility   Klebsiella pneumoniae - MIC*    AMPICILLIN RESISTANT Resistant     CEFAZOLIN <=4 SENSITIVE Sensitive     CEFEPIME <=0.12 SENSITIVE Sensitive     CEFTAZIDIME <=1 SENSITIVE Sensitive     CEFTRIAXONE <=0.25 SENSITIVE Sensitive     CIPROFLOXACIN <=0.25 SENSITIVE Sensitive     GENTAMICIN <=1 SENSITIVE Sensitive     IMIPENEM <=0.25 SENSITIVE Sensitive     TRIMETH/SULFA >=320 RESISTANT Resistant     AMPICILLIN/SULBACTAM 4 SENSITIVE Sensitive     PIP/TAZO <=4 SENSITIVE Sensitive     * KLEBSIELLA PNEUMONIAE   Proteus mirabilis - MIC*    AMPICILLIN  <=2 SENSITIVE Sensitive     CEFAZOLIN 8 SENSITIVE Sensitive     CEFEPIME <=0.12 SENSITIVE Sensitive     CEFTAZIDIME <=1 SENSITIVE Sensitive     CEFTRIAXONE <=0.25 SENSITIVE Sensitive     CIPROFLOXACIN <=0.25 SENSITIVE Sensitive     GENTAMICIN <=1 SENSITIVE Sensitive     IMIPENEM 4 SENSITIVE Sensitive     TRIMETH/SULFA <=20 SENSITIVE Sensitive     AMPICILLIN/SULBACTAM <=2 SENSITIVE Sensitive     PIP/TAZO <=4 SENSITIVE Sensitive     * PROTEUS MIRABILIS  Blood Culture ID Panel (Reflexed)     Status: Abnormal   Collection Time: 02/02/20 11:41 AM  Result Value Ref Range Status   Enterococcus faecalis NOT DETECTED NOT DETECTED Final   Enterococcus Faecium NOT DETECTED NOT DETECTED Final   Listeria monocytogenes NOT DETECTED NOT DETECTED Final   Staphylococcus species NOT DETECTED NOT DETECTED Final   Staphylococcus aureus (BCID) NOT DETECTED NOT DETECTED Final   Staphylococcus epidermidis NOT DETECTED NOT DETECTED Final   Staphylococcus lugdunensis NOT DETECTED NOT DETECTED Final   Streptococcus species NOT DETECTED NOT DETECTED Final   Streptococcus agalactiae NOT DETECTED NOT  DETECTED Final   Streptococcus pneumoniae NOT DETECTED NOT DETECTED Final   Streptococcus pyogenes NOT DETECTED NOT DETECTED Final   A.calcoaceticus-baumannii NOT DETECTED NOT DETECTED Final   Bacteroides fragilis NOT DETECTED NOT DETECTED Final   Enterobacterales DETECTED (A) NOT DETECTED Final    Comment: Enterobacterales represent a large order of gram negative bacteria, not a single organism. CRITICAL RESULT CALLED TO, READ BACK BY AND VERIFIED WITH: PHARMD GARRETT C. 1402 [id number] FCP    Enterobacter cloacae complex NOT DETECTED NOT DETECTED Final   Escherichia coli NOT DETECTED NOT DETECTED Final   Klebsiella aerogenes NOT DETECTED NOT DETECTED Final   Klebsiella oxytoca NOT DETECTED NOT DETECTED Final   Klebsiella pneumoniae DETECTED (A) NOT DETECTED Final    Comment: CRITICAL RESULT CALLED TO, READ BACK  BY AND VERIFIED WITH: PHARMD GARRETT C. 1402 [id number] FCP    Proteus species NOT DETECTED NOT DETECTED Final   Salmonella species NOT DETECTED NOT DETECTED Final   Serratia marcescens NOT DETECTED NOT DETECTED Final   Haemophilus influenzae NOT DETECTED NOT DETECTED Final   Neisseria meningitidis NOT DETECTED NOT DETECTED Final   Pseudomonas aeruginosa NOT DETECTED NOT DETECTED Final   Stenotrophomonas maltophilia NOT DETECTED NOT DETECTED Final   Candida albicans NOT DETECTED NOT DETECTED Final   Candida auris NOT DETECTED NOT DETECTED Final   Candida glabrata NOT DETECTED NOT DETECTED Final   Candida krusei NOT DETECTED NOT DETECTED Final   Candida parapsilosis NOT DETECTED NOT DETECTED Final   Candida tropicalis NOT DETECTED NOT DETECTED Final   Cryptococcus neoformans/gattii NOT DETECTED NOT DETECTED Final   CTX-M ESBL NOT DETECTED NOT DETECTED Final   Carbapenem resistance IMP NOT DETECTED NOT DETECTED Final   Carbapenem resistance KPC NOT DETECTED NOT DETECTED Final   Carbapenem resistance NDM NOT DETECTED NOT DETECTED Final   Carbapenem resist OXA 48 LIKE NOT DETECTED NOT DETECTED Final   Carbapenem resistance VIM NOT DETECTED NOT DETECTED Final    Comment: Performed at [facility], [address], [place], [place] [address]  SARS Coronavirus 2 by RT PCR (hospital order, performed in [facility] hospital lab) Nasopharyngeal Nasopharyngeal Swab     Status: None   Collection Time: 02/02/20 12:02 PM   Specimen: Nasopharyngeal Swab  Result Value Ref Range Status   SARS Coronavirus 2 NEGATIVE NEGATIVE Final    Comment: (NOTE) SARS-CoV-2 target nucleic acids are NOT DETECTED.  The SARS-CoV-2 RNA is generally detectable in upper and lower respiratory specimens during the acute phase of infection. The lowest concentration of SARS-CoV-2 viral copies this assay can detect is 250 copies / mL. A negative result does not preclude SARS-CoV-2 infection and should not be used as the  sole basis for treatment or other patient management decisions.  A negative result may occur with improper specimen collection / handling, submission of specimen other than nasopharyngeal swab, presence of viral mutation(s) within the areas targeted by this assay, and inadequate number of viral copies (<250 copies / mL). A negative result must be combined with clinical observations, patient history, and epidemiological information.  Fact Sheet for Patients:   StrictlyIdeas.no  Fact Sheet for Healthcare Providers: BankingDealers.co.za  This test is not yet approved or  cleared by the [place] FDA and has been authorized for detection and/or diagnosis of SARS-CoV-2 by FDA under an Emergency Use Authorization (EUA).  This EUA will remain in effect (meaning this test can be used) for the duration of the COVID-19 declaration under Section 564(b)(1) of the  Act, 21 U.S.C. section 360bbb-3(b)(1), unless the authorization is terminated or revoked sooner.  Performed at [facility], [address] [place], [place] [address]   Urine culture     Status: Abnormal   Collection Time: 02/02/20 12:04 PM   Specimen: In/Out Cath Urine  Result Value Ref Range Status   Specimen Description   Final    IN/OUT CATH URINE Performed at [facility], [address] [place], [place] [address]    Special Requests   Final    NONE Performed at [facility], [address], [place], [place] [address]    Culture (A)  Final    >=100,000 COLONIES/mL KLEBSIELLA PNEUMONIAE >=100,000 COLONIES/mL PROTEUS MIRABILIS    Report Status 02/04/2020 FINAL  Final   Organism ID, Bacteria KLEBSIELLA PNEUMONIAE (A)  Final   Organism ID, Bacteria PROTEUS MIRABILIS (A)  Final      Susceptibility   Klebsiella pneumoniae - MIC*    AMPICILLIN RESISTANT Resistant     CEFAZOLIN <=4 SENSITIVE Sensitive     CEFTRIAXONE <=0.25 SENSITIVE Sensitive     CIPROFLOXACIN <=0.25  SENSITIVE Sensitive     GENTAMICIN <=1 SENSITIVE Sensitive     IMIPENEM <=0.25 SENSITIVE Sensitive     NITROFURANTOIN 64 INTERMEDIATE Intermediate     TRIMETH/SULFA >=320 RESISTANT Resistant     AMPICILLIN/SULBACTAM 4 SENSITIVE Sensitive     PIP/TAZO <=4 SENSITIVE Sensitive     * >=100,000 COLONIES/mL KLEBSIELLA PNEUMONIAE   Proteus mirabilis - MIC*    AMPICILLIN <=2 SENSITIVE Sensitive     CEFAZOLIN 8 SENSITIVE Sensitive     CEFTRIAXONE <=0.25 SENSITIVE Sensitive     CIPROFLOXACIN <=0.25 SENSITIVE Sensitive     GENTAMICIN <=1 SENSITIVE Sensitive     IMIPENEM 4 SENSITIVE Sensitive     NITROFURANTOIN 128 RESISTANT Resistant     TRIMETH/SULFA <=20 SENSITIVE Sensitive     AMPICILLIN/SULBACTAM <=2 SENSITIVE Sensitive     PIP/TAZO <=4 SENSITIVE Sensitive     * >=100,000 COLONIES/mL PROTEUS MIRABILIS  Culture, blood (Routine x 2)     Status: None (Preliminary result)   Collection Time: 02/02/20 12:34 PM   Specimen: BLOOD  Result Value Ref Range Status   Specimen Description BLOOD RIGHT HAND  Final   Special Requests   Final    BOTTLES DRAWN AEROBIC AND ANAEROBIC Blood Culture adequate volume   Culture   Final    NO GROWTH 4 DAYS Performed at [facility], [address] [place], [place] [address]    Report Status PENDING  Incomplete  MRSA PCR Screening     Status: None   Collection Time: 02/03/20  1:41 AM   Specimen: Nasal Mucosa; Nasopharyngeal  Result Value Ref Range Status   MRSA by PCR NEGATIVE NEGATIVE Final    Comment:        The GeneXpert MRSA Assay (FDA approved for NASAL specimens only), is one component of a comprehensive MRSA colonization surveillance program. It is not intended to diagnose MRSA infection nor to guide or monitor treatment for MRSA infections. Performed at [facility], [address], [place], [place] [address]   C Difficile Quick Screen (NO PCR Reflex)     Status: None   Collection Time: 02/05/20  9:32 AM   Specimen: STOOL  Result Value Ref  Range Status   C Diff antigen NEGATIVE NEGATIVE Final   C Diff toxin NEGATIVE NEGATIVE Final   C Diff interpretation No C. difficile detected.  Final    Comment: Performed at [facility],  [address], [place], [place] [address]   Creatinine: Recent Labs    02/02/20 1125 02/03/20 0317 02/04/20 0634 02/05/20 0556 02/06/20 0446  CREATININE 4.65* 2.32* 0.92 0.73 0.62   Baseline Creatinine: 0.6  Impression/Assessment:  63yo with right ureteral calculus, sepsis from a urinary source  Plan:  -We discussed the management of kidney stones. These options include observation, ureteroscopy, shockwave lithotripsy (ESWL) and percutaneous nephrolithotomy (PCNL). We discussed which options are relevant to the patient's stone(s). We discussed the natural history of kidney stones as well as the complications of untreated stones and the impact on quality of life without treatment as well as with each of the above listed treatments. We also discussed the efficacy of each treatment in its ability to clear the stone burden. With any of these management options I discussed the signs and symptoms of infection and the need for emergent treatment should these be experienced. For each option we discussed the ability of each procedure to clear the patient of their stone burden.   For observation I described the risks which include but are not limited to silent renal damage, life-threatening infection, need for emergent surgery, failure to pass stone and pain.   For ureteroscopy I described the risks which include bleeding, infection, damage to contiguous structures, positioning injury, ureteral stricture, ureteral avulsion, ureteral injury, need for prolonged ureteral stent, inability to perform ureteroscopy, need for an interval procedure, inability to clear stone burden, stent discomfort/pain, heart attack, stroke, pulmonary embolus and the inherent risks with general anesthesia.   For shockwave lithotripsy I  described the risks which include arrhythmia, kidney contusion, kidney hemorrhage, need for transfusion, pain, inability to adequately break up stone, inability to pass stone fragments, Steinstrasse, infection associated with obstructing stones, need for alternate surgical procedure, need for repeat shockwave lithotripsy, MI, CVA, PE and the inherent risks with anesthesia/conscious sedation.   For PCNL I described the risks including positioning injury, pneumothorax, hydrothorax, need for chest tube, inability to clear stone burden, renal laceration, arterial venous fistula or malformation, need for embolization of kidney, loss of kidney or renal function, need for repeat procedure, need for prolonged nephrostomy tube, ureteral avulsion, MI, CVA, PE and the inherent risks of general anesthesia.   - The patient would like to proceed with right ureteral stent placement   Kerry [name] 02/06/2020, 1:45 PM

## 2020-02-06 NOTE — Interval H&P Note (Signed)
History and Physical Interval Note:  02/06/2020 7:07 PM  Kerry [name]  has presented today for surgery, with the diagnosis of right ureteral obstruction.  The various methods of treatment have been discussed with the patient and family. After consideration of risks, benefits and other options for treatment, the patient has consented to  Procedure(s): CYSTOSCOPY WITH RETROGRADE PYELOGRAM/URETERAL STENT PLACEMENT (Right) as a surgical intervention.  The patient's history has been reviewed, patient examined, no change in status, stable for surgery.  I have reviewed the patient's chart and labs.  Questions were answered to the patient's satisfaction.     Nyna Chilton D Nevyn Bossman

## 2020-02-06 NOTE — Op Note (Addendum)
PATIENT:  Kerry [name]  Preoperative diagnosis:  1. Right ureteral calculus with sepsis secondary to UTI  Postoperative diagnosis:  1. Same  Procedure:  1. Cystoscopy 2. right retrograde pyelography with interpretation - filling defect consistent with ureteral calculus seen in distal ureter 3. right ureteral stent placement  ( 6Fr x 26cm JJ no string )   Surgeon: Jacalyn Lefevre, M.D.  Anesthesia: General  Complications: None  EBL: Minimal  Specimens: None  Indication: 63yo with a hx of DMII, CVA, CAD admitted with sepsis from a UTI. She is currently on broad spectrum antibiotics for Klebsiella. She is improving on IV antibiotics and is currently having low grade fevers. She was having diffuse abdominal pain for 1 week which was mild but worsened yesterday. CT stone study obtained which showed 2 right distal ureteral calculi and mild hydronephrosis. This is her first stone event.   Description of procedure:  The patient was taken to the operating room and general anesthesia was induced.  The patient was placed in the dorsal lithotomy position, prepped and draped in the usual sterile fashion, and preoperative antibiotics were administered. A preoperative time-out was performed.   Cystourethroscopy was performed.  The patient's urethra was examined and was normal.   The bladder was then systematically examined in its entirety. There was no evidence for any bladder tumors, stones, or other mucosal pathology.    Attention then turned to the left ureteral orifice and a ureteral catheter was used to intubate the ureteral orifice.  Full-strength Omnipaque contrast was injected through the ureteral catheter and a retrograde pyelogram was performed.  A 0.038 sensor guidewire was then advanced up the right ureter into the renal pelvis under fluoroscopic guidance.  The wire was then backloaded through the cystoscope and a ureteral stent was advance over the wire using Seldinger technique.   The stent was positioned appropriately under fluoroscopic and cystoscopic guidance.  The wire was then removed with an adequate stent curl noted in the renal pelvis as well as in the bladder.  The bladder was then emptied and the procedure ended.  The patient appeared to tolerate the procedure well and without complications.  The patient was able to be awakened and transferred to the recovery unit in satisfactory condition.   Disposition: stable  Follow up: The patient will be transferred back to [facility] for continued treatment

## 2020-02-06 NOTE — Progress Notes (Signed)
Patient to go to [facility] for Ureteral Stent Placement by Dr. Oneta Rack. It is a round trip and patient will return to [facility] after procedure. [facility] notified.

## 2020-02-06 NOTE — Anesthesia Postprocedure Evaluation (Signed)
Anesthesia Post Note  Patient: Kerry [name]  Procedure(s) Performed: CYSTOSCOPY WITH RETROGRADE PYELOGRAM/URETERAL STENT PLACEMENT (Right )     Patient location during evaluation: PACU Anesthesia Type: General Level of consciousness: awake and alert Pain management: pain level controlled Vital Signs Assessment: post-procedure vital signs reviewed and stable Respiratory status: spontaneous breathing, nonlabored ventilation, respiratory function stable and patient connected to tracheostomy mask oxygen Cardiovascular status: blood pressure returned to baseline and stable Postop Assessment: no apparent nausea or vomiting Anesthetic complications: no   No complications documented.  Last Vitals:  Vitals:   02/06/20 2015 02/06/20 2030  BP: (!) 94/57 113/68  Pulse: 78 80  Resp: (!) 26 20  Temp:    SpO2: 94% 94%    Last Pain:  Vitals:   02/06/20 2012  TempSrc:   PainSc: Asleep                 Taegan Haider,W. EDMOND

## 2020-02-07 ENCOUNTER — Encounter (HOSPITAL_COMMUNITY): Payer: Self-pay | Admitting: Urology

## 2020-02-07 DIAGNOSIS — R197 Diarrhea, unspecified: Secondary | ICD-10-CM

## 2020-02-07 LAB — CBC
HCT: 35.7 % — ABNORMAL LOW (ref 36.0–46.0)
Hemoglobin: 11.1 g/dL — ABNORMAL LOW (ref 12.0–15.0)
MCH: 26.2 pg (ref 26.0–34.0)
MCHC: 31.1 g/dL (ref 30.0–36.0)
MCV: 84.2 fL (ref 80.0–100.0)
Platelets: 133 10*3/uL — ABNORMAL LOW (ref 150–400)
RBC: 4.24 MIL/uL (ref 3.87–5.11)
RDW: 14 % (ref 11.5–15.5)
WBC: 8.4 10*3/uL (ref 4.0–10.5)
nRBC: 0 % (ref 0.0–0.2)

## 2020-02-07 LAB — GASTROINTESTINAL PANEL BY PCR, STOOL (REPLACES STOOL CULTURE)

## 2020-02-07 LAB — BASIC METABOLIC PANEL
Anion gap: 11 (ref 5–15)
BUN: 9 mg/dL (ref 8–23)
CO2: 29 mmol/L (ref 22–32)
Calcium: 8 mg/dL — ABNORMAL LOW (ref 8.9–10.3)
Chloride: 98 mmol/L (ref 98–111)
Creatinine, Ser: 0.61 mg/dL (ref 0.44–1.00)
GFR calc Af Amer: >60 mL/min (ref >60)
GFR calc non Af Amer: >60 mL/min (ref >60)
Glucose, Bld: 150 mg/dL — ABNORMAL HIGH (ref 70–99)
Potassium: 3.4 mmol/L — ABNORMAL LOW (ref 3.5–5.1)
Sodium: 138 mmol/L (ref 135–145)

## 2020-02-07 LAB — GLUCOSE, CAPILLARY
Glucose-Capillary: 136 mg/dL — ABNORMAL HIGH (ref 70–99)
Glucose-Capillary: 309 mg/dL — ABNORMAL HIGH (ref 70–99)
Glucose-Capillary: 190 mg/dL — ABNORMAL HIGH (ref 70–99)
Glucose-Capillary: 226 mg/dL — ABNORMAL HIGH (ref 70–99)

## 2020-02-07 LAB — IGA: IgA: 388 mg/dL — ABNORMAL HIGH (ref 87–352)

## 2020-02-07 LAB — TISSUE TRANSGLUTAMINASE, IGA: Tissue Transglutaminase Ab, IgA: <2 U/mL (ref 0–3)

## 2020-02-07 LAB — MAGNESIUM: Magnesium: 1.6 mg/dL — ABNORMAL LOW (ref 1.7–2.4)

## 2020-02-07 MED ORDER — POTASSIUM CHLORIDE 10 MEQ/100ML IV SOLN
10.0000 meq | INTRAVENOUS | Status: AC
  Administered 2020-02-07 (×6): 10 meq via INTRAVENOUS
  Filled 2020-02-07 (×7): qty 100

## 2020-02-07 MED ORDER — MAGNESIUM SULFATE 2 GM/50ML IV SOLN
2.0000 g | Freq: Once | INTRAVENOUS | Status: AC
  Administered 2020-02-07: 2 g via INTRAVENOUS
  Filled 2020-02-07: qty 50

## 2020-02-07 MED ORDER — AZITHROMYCIN 250 MG PO TABS
1000.0000 mg | ORAL_TABLET | Freq: Once | ORAL | Status: AC
  Administered 2020-02-07: 1000 mg via ORAL
  Filled 2020-02-07: qty 4

## 2020-02-07 NOTE — Progress Notes (Signed)
Inpatient Diabetes Program Recommendations  AACE/ADA: New Consensus Statement on Inpatient Glycemic Control  Target Ranges:  Prepandial:   less than 140 mg/dL      Peak postprandial:   less than 180 mg/dL (1-2 hours)      Critically ill patients:  140 - 180 mg/dL   Results for JULYANNA, SCHOLLE (MRN [id number]) as of 02/07/2020 13:26  Ref. Range 02/06/2020 07:21 02/06/2020 12:03 02/06/2020 14:48 02/06/2020 18:49 02/06/2020 20:19 02/06/2020 22:22 02/07/2020 07:53 02/07/2020 11:54  Glucose-Capillary Latest Ref Range: 70 - 99 mg/dL 221 (H) 152 (H) 153 (H) 161 (H) 161 (H) 179 (H) 136 (H) 309 (H)   Review of Glycemic Control  Diabetes history: DM2 Outpatient Diabetes medications: Lantus 30 units QHS Current orders for Inpatient glycemic control: Novolog 0-9 units TID with meals, Novolog 0-5 units QHS  Inpatient Diabetes Program Recommendations:    Insulin-Meal Coverage: Please consider ordering Novolog 3 units TID with meals for meal coverage if patient eats at least 50% of meals.  Thanks, Barnie Alderman, RN, MSN, CDE Diabetes Coordinator Inpatient Diabetes Program [phone] (Team Pager from 8am to 5pm)

## 2020-02-07 NOTE — Progress Notes (Addendum)
Subjective: Limited history obtained from patient today.  States, "I don't know" when answering several questions.  Information I was able to obtain as per below.    Denies abdominal pain.  Reports diarrhea for 5 years.  Stools are watery.  Cannot tell me the number of stools.  When asked if he has more than 5 BMs per day, she says yes.  Also admits to nocturnal stools. Unable to say if small or large volume BMs. Cannot tell me if BMs are typically after meals.  Does not think she has any blood in her stool or black stool.  No regular abdominal pain.  Stool output has not been documented. She has flexi-seal in place with 250 mL of liquid stool in the bag at this time.  Per nursing staff, Flexi-Seal has not been emptied today.  Objective: Vital signs in last 24 hours: Temp:  [96.6 F (35.9 C)-98.9 F (37.2 C)] 98.1 F (36.7 C) (08/17 1157) Pulse Rate:  [77-96] 84 (08/17 0900) Resp:  [18-27] 19 (08/17 1157) BP: (94-143)/(48-82) 105/53 (08/17 1100) SpO2:  [94 %-98 %] 96 % (08/17 0900) FiO2 (%):  [35 %] 35 % (08/17 0900) Weight:  [92.6 kg-95.9 kg] 92.6 kg (08/17 0500) Last BM Date: 02/06/20 General:   Alert, no acute distress, trach in place.  Head:  Normocephalic and atraumatic. Eyes:  No icterus, sclera clear. Conjuctiva pink.  Abdomen:  Bowel sounds are hypoactive. Abdomen is obese, soft, non-distended, and non-tender. No rebound or guarding. No masses appreciated  Extremities:  Without edema. Neurologic:  Difficult to assess. Oriented to person. Seems she knows where she is but is unable to tell me.  Psych: Normal affect.  Intake/Output from previous day: 08/16 0701 - 08/17 0700 In: 1616.4 [I.V.:1488.7; IV Piggyback:127.7] Out: 1975 [Urine:1975] Intake/Output this shift: Total I/O In: 329 [I.V.:180; IV Piggyback:149] Out: -   Lab Results: Recent Labs    02/05/20 0556 02/06/20 0446 02/07/20 0500  WBC 8.4 7.7 8.4  HGB 11.4* 11.5* 11.1*  HCT 36.2 36.5 35.7*  PLT 89*  102* 133*   BMET Recent Labs    02/05/20 0556 02/06/20 0446 02/07/20 0500  NA 136 135 138  K 3.4* 3.3* 3.4*  CL 103 99 98  CO2 25 28 29   GLUCOSE 166* 240* 150*  BUN 8 8 9   CREATININE 0.73 0.62 0.61  CALCIUM 7.7* 8.1* 8.0*    Studies/Results: CT ABDOMEN PELVIS W CONTRAST  Result Date: 02/05/2020 CLINICAL DATA:  Abdominal distension, possible bowel obstruction. EXAM: CT ABDOMEN AND PELVIS WITH CONTRAST TECHNIQUE: Multidetector CT imaging of the abdomen and pelvis was performed using the standard protocol following bolus administration of intravenous contrast. CONTRAST:  137mL OMNIPAQUE IOHEXOL 300 MG/ML  SOLN COMPARISON:  09/19/2017 FINDINGS: Lower chest: Small bilateral pleural effusions with associated bibasilar atelectasis. Mild cardiomegaly with small pericardial effusion. Calcified plaque over the left anterior descending and right coronary arteries. Minimal calcified plaque over the descending thoracic aorta. Hepatobiliary: Liver and biliary tree are normal. Possible minimal layering sludge versus noncalcified gallstones. Pancreas: Normal. Spleen: Multiple punctate calcified granulomas. Adrenals/Urinary Tract: 1.3 cm bulbous appearance to the medial limb of the right adrenal gland. Left adrenal gland is normal. Kidneys are normal in size. No left renal mass or hydronephrosis. No left-sided nephrolithiasis. There is right-sided nephrolithiasis with a 1.6 cm stone over the upper pole. There are a few right renal cysts unchanged. Mild right-sided hydronephrosis. Mild prominence of the right ureter containing 2 adjacent stones over the distal ureter  at and just above the UVJ with the larger just above the UVJ measuring 4 mm and smaller stone at the UVJ measuring 2 mm. Left ureter and bladder are normal. Stomach/Bowel: Stomach and small bowel are normal. Previous appendectomy. Rectal tube is present. There is a redundant sigmoid colon. Mild prominence of the sigmoid colon measuring 6.2 cm in the  mid abdomen right of midline. Sigmoid colon proximal and distal to this air-filled segment is contracted. No inflammatory change or obstruction to suggest volvulus. Vascular/Lymphatic: Calcified plaque over the abdominal aorta which is normal in caliber. No adenopathy. Reproductive: Uterus and right ovary are normal. There is a simple appearing 7.1 cm left ovarian cyst unchanged. Other: Minimal free pelvic fluid. Musculoskeletal: Degenerative change of the spine and hips. IMPRESSION: 1. Right-sided nephrolithiasis. Two adjacent stones over the distal right ureter at and just above the UVJ with the larger measuring 4 mm and smaller stone at the UVJ measuring 2 mm. Low-grade obstruction with mild right-sided hydronephrosis. 2. Right renal cysts unchanged. 3. Stable 7.1 cm simple appearing left ovarian cyst. Recommend pelvic ultrasound for further evaluation. 4. Possible minimal layering sludge versus noncalcified gallstones. 5. Aortic atherosclerosis. Atherosclerotic coronary artery disease. Mild cardiomegaly with small pericardial effusion. 6. Small bilateral pleural effusions with associated bibasilar atelectasis. 7. Possible 1.3 cm right adrenal nodule likely adenoma. Recommend follow-up adrenal CT 1 year. Aortic Atherosclerosis (ICD10-I70.0). Electronically Signed   By: Marin Olp M.D.   On: 02/05/2020 17:49   DG C-Arm 1-60 Min-No Report  Result Date: 02/06/2020 Fluoroscopy was utilized by the requesting physician.  No radiographic interpretation.    Assessment: 64 year old female with numerous comorbidities, admitted with severe sepsis in setting of UTI, bacteremia, acute renal injury, severe hypokalemia in setting of chronic diarrhea. CT with findings of nephrolithiasis to right kidney and mild hydronephrosis and plans for stent placement on 8/17. GI consulted due to ongoing diarrhea.   Per husband, patient has had chronic postprandial diarrhea dating back to the time of stroke in 2016 (see consult  note).  Patient admits to more than 5 watery BMs daily as well as nocturnal stools.  No prior formal GI evaluation. Initially, abdominal xray showed marked colonic distension. CT abd/pelvis with contrast showed redundant sigmoid colon, mild prominence of sigmoid measuring 6.2 cm in mid abdomen right of midline, sigmoid proximal and distal to this is contracted; no inflammatory changes or obstruction suggesting volvulus. TSH within normal limits. Celiac serologies pending. C. difficile negative. GI pathogen panel with enteropathogenic E. coli.    Clinically, patient continues with watery stool in Flexi-Seal although does not seem to be large volume with 250 mL of liquid stool today up to 1:30 PM. This is about the same as yesterday. Unable to get clear understanding of volume of stool at home.  Not sure that E. Coli accounts for her chronic history of diarrhea.  Additional considerations include microscopic colitis, SIBO, pancreatic insufficiency, celiac disease, and less likely IBD.   Will plan to treat with azithromycin 1g x1 dose. If she continues with diarrhea, consider treating with imodium thereafter, although again, the volume of stool seems to be fairly small at this time. Would benefit from colonoscopy once recovered from acute illness, likely as an outpatient.    Plan: Azithromycin 1 g x 1 dose today to treat for enteropathogenic E. coli. Follow-up on celiac serologies. Continue supportive measures. She will need colonoscopy once over acute illness with random colon biopsies. Reassess in the morning.   LOS: 5 days  02/07/2020, 12:29 PM   Aliene Altes, [facility]  Agree with the above findings as written. The patient was presented to me by the APP (Advanced Practice Provider) and I have also seen and examined the patient independently. Please see my key portion of the encounter as documented.  GI panel PCR positive for EPEC. Will treat with Azithromycin  given worsening diarrhea. Plan for outpatient colonoscopy to rule out microscopic colitis once she recovers from acute illness.Marland Kitchen

## 2020-02-07 NOTE — Progress Notes (Signed)
PROGRESS NOTE    Kerry [name]  GMW:[id number] DOB: February 26, 1956 DOA: 02/02/2020 PCP: Blair Heys, PA-C   Brief Narrative:  Per HPI: Kerry Cocker Pattersonis a 64 y.o.femalewith medical history significant forleft-sided CVA with residual right-sided deficits, prior PE/DVT, CAD, hypertension, hyperlipidemia, and type 2 diabeteswho presented to the ED via EMS with altered mentation. Apparently she was with her husband at home who noticed that she suddenly collapsed and became quite confused. Her son came to the house to evaluate her and she was noted to be somewhat diaphoretic and had called EMS. Patient claims that she was having some burning with urination over the last couple days and did have some diarrhea as well.  -Patient was admitted with severe sepsis secondary to UTI along with associated AKI and severe hypokalemia in the setting of chronic diarrhea.  8/13: Sepsis physiology is resolving this morning with treatment of UTI. She is noted to have gram-negative rods on anaerobic bottle. AKI is also resolving with aggressive IV fluid. Potassium levels are improving as well. Continue IV fluid resuscitation as well as IV antibiotics and advance diet today supplement further potassium and transfer to telemetry if stable.  8/14: Patient continues to have persistent hypokalemia, but kidney function is improved. Sepsis physiology appears to be resolving. Noted to have Klebsiella and Proteus UTI with associated bacteremia. Appears to have some ongoing loose stools.  8/15:Hypokalemia slowly improving. She is still having liquid stools and Flexi-Seal has been placed. KUB concerning for volvulus with CT abdomen pending.  8/16: CT abdomen with findings of nephrolithiasis noted to right kidney with mild hydronephrosis.  Discussed with Dr. Gloriann Loan with urology with plans for stent placement this afternoon.  Remains n.p.o. after midnight.  Continues to have hypokalemia and liquid stools.   Continue ongoing potassium supplementation.  GI consultation appreciated for diarrhea.  C. difficile negative.  8/17: Patient has undergone cystoscopy with right retrograde pyelogram and stent placement on 4/40 with no complications noted at [facility].  She continues to have ongoing liquid stool output that is being evaluated by GI with celiac studies and GI panel pending.  She continues to have ongoing hypokalemia and hypomagnesemia.  Foley catheter to be removed today.  Continue Rocephin as ordered.  Assessment & Plan:   Active Problems:   Severe sepsis (HCC)   Diarrhea   Severe sepsis secondary toKlebsiella and ProteusUTIwith associated bacteremia -improving -Continue Rocephin 2 g IV daily -Continue IV fluid -Monitora.m. CBC  Abdominal distentionwith ongoing liquid stools -Suspicion for microscopic colitis -CT abdomen with mild prominence of sigmoid colon -Cdiff negative -GI panel pending -TSH 1.46 -Celiac panel pending -Imodium  recommended by GI for now -Appreciate further GI recommendations today and may need eventual colonoscopy  Right-sided nephrolithiasis with mild hydronephrosis -Status post cystoscopy with right stent placement on 8/16 -Appreciate further urology recommendations  Severe AKI likely secondary tosepsis-resolved -Continue IV fluid -Vigorous urine output noted -Monitor strict I's and O's -Repeat a.m. labs  Hypokalemia-persistent -Related to ongoing liquid bowel movements -Imodium as needed, has not been helping -Replete and monitor on telemetry -Reevaluate in a.m. -Continue ongoing repletion with IV today  Hypomagnesemia -Replete and reevaluate in a.m.  Thrombocytopenia-improving -Likely secondary to sepsis physiology -Continue on Xarelto  with no active bleeding noted -Monitor on repeat CBC  Mild hyponatremia-resolved -Continue monitoring  Prior left-sided CVA with residual deficits -Prior tracheostomy from this  episode  Prior PE/DVT -Continue anticoagulation with Xarelto, pharmacy to adjust  History of hypertension -Plan to resume home amlodipine and Coreg  today  History of dyslipidemia -Continue statin  History of type 2 diabetes-with mild hyperglycemia -Hold home Lantus andcontinueSSI -Plan to start long-acting insulin today -Hemoglobin A1c 8.9%  Right adrenal nodule -Incidentally noted on abdominal CT 8/15 -Likely adenoma -Follow-up CT scan in 1 year  DVT prophylaxis:Xarelto Code Status:Full code Family Communication:Discussed with son [name] on phone 8/17 Disposition Plan:  Status is: Inpatient  Remains inpatient appropriate because:IV treatments appropriate due to intensity of illness or inability to take PO and Inpatient level of care appropriate due to severity of illness   Dispo: The patient is from:Home Anticipated d/c is KG:MWNU Anticipated d/c date is: 2-3 days Patient currently is not medically stable to d/c.Patient continues to have significant hypokalemia/hypomagnesemia that is requiring ongoing repletion.  She also continues to have significant liquidy stools that requires IV fluid for prevention of dehydration.  GI working on liquid stool evaluation with possible need for colonoscopy.  She requires ongoing IV antibiotics for treatment of her UTI with bacteremia.  Consultants:  Urology  GI  Procedures:  See below  Antimicrobials:  Anti-infectives (From admission, onward)   Start     Dose/Rate Route Frequency Ordered Stop   02/04/20 1200  cefTRIAXone (ROCEPHIN) 2 g in sodium chloride 0.9 % 100 mL IVPB     Discontinue     2 g 200 mL/hr over 30 Minutes Intravenous Every 24 hours 02/03/20 1421     02/03/20 1430  cefTRIAXone (ROCEPHIN) 1 g in sodium chloride 0.9 % 100 mL IVPB        1 g 200 mL/hr over 30 Minutes Intravenous  Once 02/03/20 1422 02/03/20 1541   02/02/20 1215  cefTRIAXone (ROCEPHIN) 1  g in sodium chloride 0.9 % 100 mL IVPB  Status:  Discontinued        1 g 200 mL/hr over 30 Minutes Intravenous Every 24 hours 02/02/20 1204 02/03/20 1421       Subjective: Patient seen and evaluated today she appears to be doing well with no complaints noted.  She still has ongoing liquid stool output.  Objective: Vitals:   02/07/20 0900 02/07/20 1000 02/07/20 1100 02/07/20 1157  BP: (!) 127/57 (!) 114/49 (!) 105/53   Pulse: 84     Resp: (!) 23 (!) 26 (!) 23 19  Temp:    98.1 F (36.7 C)  TempSrc:    Oral  SpO2: 96%     Weight:      Height:        Intake/Output Summary (Last 24 hours) at 02/07/2020 1206 Last data filed at 02/07/2020 0936 Gross per 24 hour  Intake 1945.38 ml  Output 1175 ml  Net 770.38 ml   Filed Weights   02/06/20 1424 02/06/20 1800 02/07/20 0500  Weight: 95.9 kg 95.9 kg 92.6 kg    Examination:  General exam: Appears calm and comfortable  Respiratory system: Clear to auscultation. Respiratory effort normal.  Trach with trach collar present Cardiovascular system: S1 & S2 heard, RRR. Gastrointestinal system: Abdomen is minimally distended and nontender. Central nervous system: Alert and awake Extremities: No edema Skin: No rashes, lesions or ulcers Psychiatry:  Mood & affect appropriate.     Data Reviewed: I have personally reviewed following labs and imaging studies  CBC: Recent Labs  Lab 02/02/20 1125 02/02/20 1125 02/03/20 0317 02/04/20 0634 02/05/20 0556 02/06/20 0446 02/07/20 0500  WBC 20.5*   < > 12.7* 10.7* 8.4 7.7 8.4  NEUTROABS 17.4*  --   --   --   --   --   --  HGB 11.9*   < > 12.7 12.4 11.4* 11.5* 11.1*  HCT 36.1   < > 40.3 38.8 36.2 36.5 35.7*  MCV 81.7   < > 85.0 82.9 83.6 83.7 84.2  PLT 94*   < > 77* 86* 89* 102* 133*   < > = values in this interval not displayed.   Basic Metabolic Panel: Recent Labs  Lab 02/03/20 0317 02/03/20 1537 02/04/20 0634 02/04/20 1632 02/05/20 0556 02/06/20 0446 02/07/20 0500  NA 142   --  140  --  136 135 138  K 2.5*   < > 2.5* 2.8* 3.4* 3.3* 3.4*  CL 110  --  105  --  103 99 98  CO2 22  --  24  --  25 28 29   GLUCOSE 147*  --  96  --  166* 240* 150*  BUN 43*  --  13  --  8 8 9   CREATININE 2.32*  --  0.92  --  0.73 0.62 0.61  CALCIUM 7.4*  --  7.7*  --  7.7* 8.1* 8.0*  MG 2.1  --  1.4*  --  1.6* 1.4* 1.6*   < > = values in this interval not displayed.   GFR: Estimated Creatinine Clearance: 82 mL/min (by C-G formula based on SCr of 0.61 mg/dL). Liver Function Tests: Recent Labs  Lab 02/02/20 1125 02/03/20 0317  AST 13* 15  ALT 12 14  ALKPHOS 65 78  BILITOT 0.9 0.6  PROT 6.2* 6.7  ALBUMIN 2.6* 2.6*   No results for input(s): LIPASE, AMYLASE in the last 168 hours. No results for input(s): AMMONIA in the last 168 hours. Coagulation Profile: Recent Labs  Lab 02/02/20 1125 02/03/20 0317  INR 1.7* 2.5*   Cardiac Enzymes: No results for input(s): CKTOTAL, CKMB, CKMBINDEX, TROPONINI in the last 168 hours. BNP (last 3 results) No results for input(s): PROBNP in the last 8760 hours. HbA1C: No results for input(s): HGBA1C in the last 72 hours. CBG: Recent Labs  Lab 02/06/20 1849 02/06/20 2019 02/06/20 2222 02/07/20 0753 02/07/20 1154  GLUCAP 161* 161* 179* 136* 309*   Lipid Profile: No results for input(s): CHOL, HDL, LDLCALC, TRIG, CHOLHDL, LDLDIRECT in the last 72 hours. Thyroid Function Tests: No results for input(s): TSH, T4TOTAL, FREET4, T3FREE, THYROIDAB in the last 72 hours. Anemia Panel: No results for input(s): VITAMINB12, FOLATE, FERRITIN, TIBC, IRON, RETICCTPCT in the last 72 hours. Sepsis Labs: Recent Labs  Lab 02/02/20 1125 02/02/20 1348 02/03/20 0317 02/04/20 0634  PROCALCITON  --   --  >150.00  --   LATICACIDVEN 1.8 1.9 1.6 1.0    Recent Results (from the past 240 hour(s))  Culture, blood (Routine x 2)     Status: Abnormal   Collection Time: 02/02/20 11:41 AM   Specimen: BLOOD  Result Value Ref Range Status   Specimen  Description   Final    BLOOD LEFT ANTECUBITAL Performed at [facility], [address], [place], [place] [address]    Special Requests   Final    BOTTLES DRAWN AEROBIC AND ANAEROBIC Blood Culture adequate volume Performed at [facility], [address] [place], [place] [address]    Culture  Setup Time   Final    GRAM NEGATIVE RODS Gram Stain Report Called to,Read Back By and Verified With: HEARN,J AT 0703 ON 02/03/20 BY HUFFINES,S. IN BOTH AEROBIC AND ANAEROBIC BOTTLES Gram Stain Report Called to,Read Back By and Verified With: PREVIOUSLY CALLED @[id number]  08/13/2021KAY CRITICAL RESULT CALLED TO, READ  BACK BY AND VERIFIED WITH: Verdie Mosher [id number] FCP Performed at [facility], [place] [address] [place], [place] [address]    Culture KLEBSIELLA PNEUMONIAE PROTEUS MIRABILIS  (A)  Final   Report Status 02/05/2020 FINAL  Final   Organism ID, Bacteria KLEBSIELLA PNEUMONIAE  Final   Organism ID, Bacteria PROTEUS MIRABILIS  Final      Susceptibility   Klebsiella pneumoniae - MIC*    AMPICILLIN RESISTANT Resistant     CEFAZOLIN <=4 SENSITIVE Sensitive     CEFEPIME <=0.12 SENSITIVE Sensitive     CEFTAZIDIME <=1 SENSITIVE Sensitive     CEFTRIAXONE <=0.25 SENSITIVE Sensitive     CIPROFLOXACIN <=0.25 SENSITIVE Sensitive     GENTAMICIN <=1 SENSITIVE Sensitive     IMIPENEM <=0.25 SENSITIVE Sensitive     TRIMETH/SULFA >=320 RESISTANT Resistant     AMPICILLIN/SULBACTAM 4 SENSITIVE Sensitive     PIP/TAZO <=4 SENSITIVE Sensitive     * KLEBSIELLA PNEUMONIAE   Proteus mirabilis - MIC*    AMPICILLIN <=2 SENSITIVE Sensitive     CEFAZOLIN 8 SENSITIVE Sensitive     CEFEPIME <=0.12 SENSITIVE Sensitive     CEFTAZIDIME <=1 SENSITIVE Sensitive     CEFTRIAXONE <=0.25 SENSITIVE Sensitive     CIPROFLOXACIN <=0.25 SENSITIVE Sensitive     GENTAMICIN <=1 SENSITIVE Sensitive     IMIPENEM 4 SENSITIVE Sensitive     TRIMETH/SULFA <=20 SENSITIVE Sensitive     AMPICILLIN/SULBACTAM <=2 SENSITIVE  Sensitive     PIP/TAZO <=4 SENSITIVE Sensitive     * PROTEUS MIRABILIS  Blood Culture ID Panel (Reflexed)     Status: Abnormal   Collection Time: 02/02/20 11:41 AM  Result Value Ref Range Status   Enterococcus faecalis NOT DETECTED NOT DETECTED Final   Enterococcus Faecium NOT DETECTED NOT DETECTED Final   Listeria monocytogenes NOT DETECTED NOT DETECTED Final   Staphylococcus species NOT DETECTED NOT DETECTED Final   Staphylococcus aureus (BCID) NOT DETECTED NOT DETECTED Final   Staphylococcus epidermidis NOT DETECTED NOT DETECTED Final   Staphylococcus lugdunensis NOT DETECTED NOT DETECTED Final   Streptococcus species NOT DETECTED NOT DETECTED Final   Streptococcus agalactiae NOT DETECTED NOT DETECTED Final   Streptococcus pneumoniae NOT DETECTED NOT DETECTED Final   Streptococcus pyogenes NOT DETECTED NOT DETECTED Final   A.calcoaceticus-baumannii NOT DETECTED NOT DETECTED Final   Bacteroides fragilis NOT DETECTED NOT DETECTED Final   Enterobacterales DETECTED (A) NOT DETECTED Final    Comment: Enterobacterales represent a large order of gram negative bacteria, not a single organism. CRITICAL RESULT CALLED TO, READ BACK BY AND VERIFIED WITH: PHARMD GARRETT C. [id number] FCP    Enterobacter cloacae complex NOT DETECTED NOT DETECTED Final   Escherichia coli NOT DETECTED NOT DETECTED Final   Klebsiella aerogenes NOT DETECTED NOT DETECTED Final   Klebsiella oxytoca NOT DETECTED NOT DETECTED Final   Klebsiella pneumoniae DETECTED (A) NOT DETECTED Final    Comment: CRITICAL RESULT CALLED TO, READ BACK BY AND VERIFIED WITH: PHARMD GARRETT C. [id number] FCP    Proteus species NOT DETECTED NOT DETECTED Final   Salmonella species NOT DETECTED NOT DETECTED Final   Serratia marcescens NOT DETECTED NOT DETECTED Final   Haemophilus influenzae NOT DETECTED NOT DETECTED Final   Neisseria meningitidis NOT DETECTED NOT DETECTED Final   Pseudomonas aeruginosa NOT DETECTED NOT DETECTED Final     Stenotrophomonas maltophilia NOT DETECTED NOT DETECTED Final   Candida albicans NOT DETECTED NOT DETECTED Final   Candida auris NOT DETECTED NOT  DETECTED Final   Candida glabrata NOT DETECTED NOT DETECTED Final   Candida krusei NOT DETECTED NOT DETECTED Final   Candida parapsilosis NOT DETECTED NOT DETECTED Final   Candida tropicalis NOT DETECTED NOT DETECTED Final   Cryptococcus neoformans/gattii NOT DETECTED NOT DETECTED Final   CTX-M ESBL NOT DETECTED NOT DETECTED Final   Carbapenem resistance IMP NOT DETECTED NOT DETECTED Final   Carbapenem resistance KPC NOT DETECTED NOT DETECTED Final   Carbapenem resistance NDM NOT DETECTED NOT DETECTED Final   Carbapenem resist OXA 48 LIKE NOT DETECTED NOT DETECTED Final   Carbapenem resistance VIM NOT DETECTED NOT DETECTED Final    Comment: Performed at [facility], [place] [address] [place], [place] [address]  SARS Coronavirus 2 by RT PCR (hospital order, performed in [facility] hospital lab) Nasopharyngeal Nasopharyngeal Swab     Status: None   Collection Time: 02/02/20 12:02 PM   Specimen: Nasopharyngeal Swab  Result Value Ref Range Status   SARS Coronavirus 2 NEGATIVE NEGATIVE Final    Comment: (NOTE) SARS-CoV-2 target nucleic acids are NOT DETECTED.  The SARS-CoV-2 RNA is generally detectable in upper and lower respiratory specimens during the acute phase of infection. The lowest concentration of SARS-CoV-2 viral copies this assay can detect is 250 copies / mL. A negative result does not preclude SARS-CoV-2 infection and should not be used as the sole basis for treatment or other patient management decisions.  A negative result may occur with improper specimen collection / handling, submission of specimen other than nasopharyngeal swab, presence of viral mutation(s) within the areas targeted by this assay, and inadequate number of viral copies (<250 copies / mL). A negative result must be combined with clinical observations,  patient history, and epidemiological information.  Fact Sheet for Patients:   StrictlyIdeas.no  Fact Sheet for Healthcare Providers: BankingDealers.co.za  This test is not yet approved or  cleared by the [place] FDA and has been authorized for detection and/or diagnosis of SARS-CoV-2 by FDA under an Emergency Use Authorization (EUA).  This EUA will remain in effect (meaning this test can be used) for the duration of the COVID-19 declaration under Section 564(b)(1) of the Act, 21 U.S.C. section 360bbb-3(b)(1), unless the authorization is terminated or revoked sooner.  Performed at [facility], [address], [place], [place] [address]   Urine culture     Status: Abnormal   Collection Time: 02/02/20 12:04 PM   Specimen: In/Out Cath Urine  Result Value Ref Range Status   Specimen Description   Final    IN/OUT CATH URINE Performed at [facility], [address] [place], [place] [address]    Special Requests   Final    NONE Performed at [facility], [address], [place], [place] [address]    Culture (A)  Final    >=100,000 COLONIES/mL KLEBSIELLA PNEUMONIAE >=100,000 COLONIES/mL PROTEUS MIRABILIS    Report Status 02/04/2020 FINAL  Final   Organism ID, Bacteria KLEBSIELLA PNEUMONIAE (A)  Final   Organism ID, Bacteria PROTEUS MIRABILIS (A)  Final      Susceptibility   Klebsiella pneumoniae - MIC*    AMPICILLIN RESISTANT Resistant     CEFAZOLIN <=4 SENSITIVE Sensitive     CEFTRIAXONE <=0.25 SENSITIVE Sensitive     CIPROFLOXACIN <=0.25 SENSITIVE Sensitive     GENTAMICIN <=1 SENSITIVE Sensitive     IMIPENEM <=0.25 SENSITIVE Sensitive     NITROFURANTOIN 64 INTERMEDIATE Intermediate     TRIMETH/SULFA >=320 RESISTANT Resistant  AMPICILLIN/SULBACTAM 4 SENSITIVE Sensitive     PIP/TAZO <=4 SENSITIVE Sensitive     * >=100,000 COLONIES/mL KLEBSIELLA PNEUMONIAE   Proteus mirabilis - MIC*    AMPICILLIN <=2 SENSITIVE Sensitive       CEFAZOLIN 8 SENSITIVE Sensitive     CEFTRIAXONE <=0.25 SENSITIVE Sensitive     CIPROFLOXACIN <=0.25 SENSITIVE Sensitive     GENTAMICIN <=1 SENSITIVE Sensitive     IMIPENEM 4 SENSITIVE Sensitive     NITROFURANTOIN 128 RESISTANT Resistant     TRIMETH/SULFA <=20 SENSITIVE Sensitive     AMPICILLIN/SULBACTAM <=2 SENSITIVE Sensitive     PIP/TAZO <=4 SENSITIVE Sensitive     * >=100,000 COLONIES/mL PROTEUS MIRABILIS  Culture, blood (Routine x 2)     Status: None (Preliminary result)   Collection Time: 02/02/20 12:34 PM   Specimen: BLOOD  Result Value Ref Range Status   Specimen Description BLOOD RIGHT HAND  Final   Special Requests   Final    BOTTLES DRAWN AEROBIC AND ANAEROBIC Blood Culture adequate volume   Culture   Final    NO GROWTH 4 DAYS Performed at [facility], [address] [place], [place] [address]    Report Status PENDING  Incomplete  MRSA PCR Screening     Status: None   Collection Time: 02/03/20  1:41 AM   Specimen: Nasal Mucosa; Nasopharyngeal  Result Value Ref Range Status   MRSA by PCR NEGATIVE NEGATIVE Final    Comment:        The GeneXpert MRSA Assay (FDA approved for NASAL specimens only), is one component of a comprehensive MRSA colonization surveillance program. It is not intended to diagnose MRSA infection nor to guide or monitor treatment for MRSA infections. Performed at [facility], [address], [place], [place] [address]   C Difficile Quick Screen (NO PCR Reflex)     Status: None   Collection Time: 02/05/20  9:32 AM   Specimen: STOOL  Result Value Ref Range Status   C Diff antigen NEGATIVE NEGATIVE Final   C Diff toxin NEGATIVE NEGATIVE Final   C Diff interpretation No C. difficile detected.  Final    Comment: Performed at [facility], [address] [place],  [address]         Radiology Studies: CT ABDOMEN PELVIS W CONTRAST  Result Date: 02/05/2020 CLINICAL DATA:  Abdominal distension, possible bowel obstruction. EXAM: CT  ABDOMEN AND PELVIS WITH CONTRAST TECHNIQUE: Multidetector CT imaging of the abdomen and pelvis was performed using the standard protocol following bolus administration of intravenous contrast. CONTRAST:  168mL OMNIPAQUE IOHEXOL 300 MG/ML  SOLN COMPARISON:  09/19/2017 FINDINGS: Lower chest: Small bilateral pleural effusions with associated bibasilar atelectasis. Mild cardiomegaly with small pericardial effusion. Calcified plaque over the left anterior descending and right coronary arteries. Minimal calcified plaque over the descending thoracic aorta. Hepatobiliary: Liver and biliary tree are normal. Possible minimal layering sludge versus noncalcified gallstones. Pancreas: Normal. Spleen: Multiple punctate calcified granulomas. Adrenals/Urinary Tract: 1.3 cm bulbous appearance to the medial limb of the right adrenal gland. Left adrenal gland is normal. Kidneys are normal in size. No left renal mass or hydronephrosis. No left-sided nephrolithiasis. There is right-sided nephrolithiasis with a 1.6 cm stone over the upper pole. There are a few right renal cysts unchanged. Mild right-sided hydronephrosis. Mild prominence of the right ureter containing 2 adjacent stones over the distal ureter at and just above the UVJ with the larger just above the UVJ measuring 4 mm and smaller stone at the UVJ measuring  2 mm. Left ureter and bladder are normal. Stomach/Bowel: Stomach and small bowel are normal. Previous appendectomy. Rectal tube is present. There is a redundant sigmoid colon. Mild prominence of the sigmoid colon measuring 6.2 cm in the mid abdomen right of midline. Sigmoid colon proximal and distal to this air-filled segment is contracted. No inflammatory change or obstruction to suggest volvulus. Vascular/Lymphatic: Calcified plaque over the abdominal aorta which is normal in caliber. No adenopathy. Reproductive: Uterus and right ovary are normal. There is a simple appearing 7.1 cm left ovarian cyst unchanged. Other:  Minimal free pelvic fluid. Musculoskeletal: Degenerative change of the spine and hips. IMPRESSION: 1. Right-sided nephrolithiasis. Two adjacent stones over the distal right ureter at and just above the UVJ with the larger measuring 4 mm and smaller stone at the UVJ measuring 2 mm. Low-grade obstruction with mild right-sided hydronephrosis. 2. Right renal cysts unchanged. 3. Stable 7.1 cm simple appearing left ovarian cyst. Recommend pelvic ultrasound for further evaluation. 4. Possible minimal layering sludge versus noncalcified gallstones. 5. Aortic atherosclerosis. Atherosclerotic coronary artery disease. Mild cardiomegaly with small pericardial effusion. 6. Small bilateral pleural effusions with associated bibasilar atelectasis. 7. Possible 1.3 cm right adrenal nodule likely adenoma. Recommend follow-up adrenal CT 1 year. Aortic Atherosclerosis (ICD10-I70.0). Electronically Signed   By: Marin Olp M.D.   On: 02/05/2020 17:49   DG C-Arm 1-60 Min-No Report  Result Date: 02/06/2020 Fluoroscopy was utilized by the requesting physician.  No radiographic interpretation.        Scheduled Meds: . amLODipine  10 mg Oral Daily  . atorvastatin  40 mg Oral q1800  . carvedilol  12.5 mg Oral BID WC  . Chlorhexidine Gluconate Cloth  6 each Topical Daily  . citalopram  20 mg Oral Daily  . insulin aspart  0-5 Units Subcutaneous QHS  . insulin aspart  0-9 Units Subcutaneous TID WC  . rivaroxaban  20 mg Oral Q supper   Continuous Infusions: . sodium chloride Stopped (02/06/20 1628)  . cefTRIAXone (ROCEPHIN)  IV Stopped (02/06/20 1649)  . lactated ringers 50 mL/hr at 02/07/20 0936  . potassium chloride 10 mEq (02/07/20 1159)     LOS: 5 days    Time spent: 30 minutes    Dakoda Bassette Darleen Crocker, DO [facility]  If 7PM-7AM, please contact night-coverage [email or web address] 02/07/2020, 12:06 PM

## 2020-02-08 ENCOUNTER — Telehealth: Payer: Self-pay | Admitting: Gastroenterology

## 2020-02-08 ENCOUNTER — Encounter: Payer: Self-pay | Admitting: Internal Medicine

## 2020-02-08 DIAGNOSIS — E876 Hypokalemia: Secondary | ICD-10-CM

## 2020-02-08 DIAGNOSIS — R197 Diarrhea, unspecified: Secondary | ICD-10-CM | POA: Diagnosis not present

## 2020-02-08 DIAGNOSIS — Z930 Tracheostomy status: Secondary | ICD-10-CM

## 2020-02-08 LAB — BASIC METABOLIC PANEL
Anion gap: 7 (ref 5–15)
BUN: 8 mg/dL (ref 8–23)
CO2: 29 mmol/L (ref 22–32)
Calcium: 7.7 mg/dL — ABNORMAL LOW (ref 8.9–10.3)
Chloride: 100 mmol/L (ref 98–111)
Creatinine, Ser: 0.57 mg/dL (ref 0.44–1.00)
GFR calc Af Amer: >60 mL/min (ref >60)
GFR calc non Af Amer: >60 mL/min (ref >60)
Glucose, Bld: 184 mg/dL — ABNORMAL HIGH (ref 70–99)
Potassium: 3.7 mmol/L (ref 3.5–5.1)
Sodium: 136 mmol/L (ref 135–145)

## 2020-02-08 LAB — CBC
HCT: 34 % — ABNORMAL LOW (ref 36.0–46.0)
Hemoglobin: 10.6 g/dL — ABNORMAL LOW (ref 12.0–15.0)
MCH: 26.6 pg (ref 26.0–34.0)
MCHC: 31.2 g/dL (ref 30.0–36.0)
MCV: 85.2 fL (ref 80.0–100.0)
Platelets: 165 10*3/uL (ref 150–400)
RBC: 3.99 MIL/uL (ref 3.87–5.11)
RDW: 13.8 % (ref 11.5–15.5)
WBC: 8.1 10*3/uL (ref 4.0–10.5)
nRBC: 0 % (ref 0.0–0.2)

## 2020-02-08 LAB — GLUCOSE, CAPILLARY
Glucose-Capillary: 154 mg/dL — ABNORMAL HIGH (ref 70–99)
Glucose-Capillary: 262 mg/dL — ABNORMAL HIGH (ref 70–99)
Glucose-Capillary: 220 mg/dL — ABNORMAL HIGH (ref 70–99)
Glucose-Capillary: 246 mg/dL — ABNORMAL HIGH (ref 70–99)

## 2020-02-08 LAB — CULTURE, BLOOD (ROUTINE X 2)
Culture: NO GROWTH
Special Requests: ADEQUATE

## 2020-02-08 LAB — MAGNESIUM: Magnesium: 1.6 mg/dL — ABNORMAL LOW (ref 1.7–2.4)

## 2020-02-08 NOTE — Progress Notes (Signed)
Inpatient Diabetes Program Recommendations  AACE/ADA: New Consensus Statement on Inpatient Glycemic Control   Target Ranges:  Prepandial:   less than 140 mg/dL      Peak postprandial:   less than 180 mg/dL (1-2 hours)      Critically ill patients:  140 - 180 mg/dL   Results for Kerry [name], Kerry [name] (MRN [id number]) as of 02/08/2020 12:45  Ref. Range 02/07/2020 07:53 02/07/2020 11:54 02/07/2020 16:38 02/07/2020 21:39 02/08/2020 07:29 02/08/2020 11:24  Glucose-Capillary Latest Ref Range: 70 - 99 mg/dL 136 (H) 309 (H) 190 (H) 226 (H) 154 (H) 262 (H)   Review of Glycemic Control  history: DM2 Outpatient Diabetes medications: Lantus 30 units QHS Current orders for Inpatient glycemic control: Novolog 0-9 units TID with meals, Novolog 0-5 units QHS  Inpatient Diabetes Program Recommendations:    Insulin-Meal Coverage: Post prandial glucose consistently elevated.  Please consider ordering Novolog 3 units TID with meals for meal coverage if patient eats at least 50% of meals.  Thanks, Barnie Alderman, RN, MSN, CDE Diabetes Coordinator Inpatient Diabetes Program [phone] (Team Pager from 8am to 5pm)

## 2020-02-08 NOTE — Progress Notes (Signed)
PROGRESS NOTE    Kerry [name]  LDJ:[id number] DOB: 1955-12-17 DOA: 02/02/2020 PCP: Blair Heys, PA-C   Brief Narrative:  Per HPI: Kerry Hoban Pattersonis a 64 y.o.femalewith medical history significant forleft-sided CVA with residual right-sided deficits, prior PE/DVT, CAD, hypertension, hyperlipidemia, and type 2 diabeteswho presented to the ED via EMS with altered mentation. Apparently she was with her husband at home who noticed that she suddenly collapsed and became quite confused. Her son came to the house to evaluate her and she was noted to be somewhat diaphoretic and had called EMS. Patient claims that she was having some burning with urination over the last couple days and did have some diarrhea as well.  -Patient was admitted with severe sepsis secondary to UTI along with associated AKI and severe hypokalemia in the setting of chronic diarrhea.    Assessment & Plan:   Active Problems:   Severe sepsis (HCC)   Diarrhea   Severe sepsis secondary toKlebsiella and ProteusUTIwith associated bacteremia -improving -Continue Rocephin 2 g IV daily, today would be day 7 of therapy -We will discontinue after today's dose -Monitora.m. CBC  Abdominal distentionwith ongoing liquid stools -Suspicion for microscopic colitis -CT abdomen with mild prominence of sigmoid colon -Cdiff negative -GI panel positive for E. coli, treated with azithromycin -TSH 1.46 -Celiac panel pending -Imodium  recommended by GI for now -Appreciate further GI recommendations today and may need eventual colonoscopy  Right-sided nephrolithiasis with mild hydronephrosis -Status post cystoscopy with right stent placement on 8/16 -Appreciate further urology recommendations  Severe AKI likely secondary tosepsis-resolved -Continue IV fluid -Vigorous urine output noted -Monitor strict I's and O's -Repeat a.m. labs  Hypokalemia-persistent -Related to ongoing liquid bowel  movements -Imodium as needed, has not been helping -Replace   Hypomagnesemia -Replaced  Thrombocytopenia-improving -Likely secondary to sepsis physiology -Continue on Xarelto  with no active bleeding noted -Monitor on repeat CBC  Mild hyponatremia-resolved -Continue monitoring  Prior left-sided CVA with residual deficits -Prior tracheostomy from this episode  Prior PE/DVT -Continue anticoagulation with Xarelto, pharmacy to adjust  History of hypertension -Continue on amlodipine and Coreg  History of dyslipidemia -Continue statin  History of type 2 diabetes-with mild hyperglycemia -Hold home Lantus andcontinueSSI -Plan to start long-acting insulin today -Hemoglobin A1c 8.9%  Right adrenal nodule -Incidentally noted on abdominal CT 8/15 -Likely adenoma -Follow-up CT scan in 1 year  DVT prophylaxis:Xarelto Code Status:Full code Family Communication:Discussed with son [name] on phone 8/17 Disposition Plan:  Status is: Inpatient  Remains inpatient appropriate because:IV treatments appropriate due to intensity of illness or inability to take PO and Inpatient level of care appropriate due to severity of illness   Dispo: The patient is from:Home Anticipated d/c is QZ:ESPQ Anticipated d/c date is: 2-3 days Patient currently is not medically stable to d/c. She will complete IV antibiotics today.  Will discontinue further IV fluids as well as Flexi-Seal to ensure that diarrhea is controlled and she does not need IV fluid support to maintain hydration.  Consultants:  Urology  GI  Procedures:  See below  Antimicrobials:  Anti-infectives (From admission, onward)   Start     Dose/Rate Route Frequency Ordered Stop   02/07/20 1445  azithromycin (ZITHROMAX) tablet 1,000 mg        1,000 mg Oral  Once 02/07/20 1438 02/07/20 1720   02/04/20 1200  cefTRIAXone (ROCEPHIN) 2 g in sodium chloride 0.9 % 100 mL  IVPB     Discontinue     2 g 200 mL/hr over 30  Minutes Intravenous Every 24 hours 02/03/20 1421     02/03/20 1430  cefTRIAXone (ROCEPHIN) 1 g in sodium chloride 0.9 % 100 mL IVPB        1 g 200 mL/hr over 30 Minutes Intravenous  Once 02/03/20 1422 02/03/20 1541   02/02/20 1215  cefTRIAXone (ROCEPHIN) 1 g in sodium chloride 0.9 % 100 mL IVPB  Status:  Discontinued        1 g 200 mL/hr over 30 Minutes Intravenous Every 24 hours 02/02/20 1204 02/03/20 1421      Subjective: Denies any worsening shortness of breath.  Continues to have Flexi-Seal in place.  No nausea or vomiting.  Objective: Vitals:   02/08/20 1000 02/08/20 1100 02/08/20 1200 02/08/20 1300  BP: (!) 123/53 (!) 127/54 (!) 115/54 (!) 132/55  Pulse: 77 74 73 74  Resp: 17 (!) 23 (!) 25 13  Temp:      TempSrc:      SpO2: 100% 99% 100% 100%  Weight:      Height:        Intake/Output Summary (Last 24 hours) at 02/08/2020 1552 Last data filed at 02/08/2020 1358 Gross per 24 hour  Intake 360 ml  Output 4700 ml  Net -4340 ml   Filed Weights   02/06/20 1800 02/07/20 0500 02/08/20 0500  Weight: 95.9 kg 92.6 kg 94.7 kg    Examination:  General exam: Appears calm and comfortable  Respiratory system: Clear to auscultation. Respiratory effort normal.  Trach with trach collar present Cardiovascular system: S1 & S2 heard, RRR. Gastrointestinal system: Abdomen is minimally distended and nontender. Central nervous system: Alert and awake Extremities: No edema Skin: No rashes, lesions or ulcers Psychiatry:  Mood & affect appropriate.     Data Reviewed: I have personally reviewed following labs and imaging studies  CBC: Recent Labs  Lab 02/02/20 1125 02/03/20 0317 02/04/20 0634 02/05/20 0556 02/06/20 0446 02/07/20 0500 02/08/20 0451  WBC 20.5*   < > 10.7* 8.4 7.7 8.4 8.1  NEUTROABS 17.4*  --   --   --   --   --   --   HGB 11.9*   < > 12.4 11.4* 11.5* 11.1* 10.6*  HCT 36.1   < > 38.8 36.2 36.5 35.7* 34.0*  MCV  81.7   < > 82.9 83.6 83.7 84.2 85.2  PLT 94*   < > 86* 89* 102* 133* 165   < > = values in this interval not displayed.   Basic Metabolic Panel: Recent Labs  Lab 02/04/20 0634 02/04/20 0634 02/04/20 1632 02/05/20 0556 02/06/20 0446 02/07/20 0500 02/08/20 0451  NA 140  --   --  136 135 138 136  K 2.5*   < > 2.8* 3.4* 3.3* 3.4* 3.7  CL 105  --   --  103 99 98 100  CO2 24  --   --  25 28 29 29   GLUCOSE 96  --   --  166* 240* 150* 184*  BUN 13  --   --  8 8 9 8   CREATININE 0.92  --   --  0.73 0.62 0.61 0.57  CALCIUM 7.7*  --   --  7.7* 8.1* 8.0* 7.7*  MG 1.4*  --   --  1.6* 1.4* 1.6* 1.6*   < > = values in this interval not displayed.   GFR: Estimated Creatinine Clearance: 83 mL/min (by C-G formula based on SCr of 0.57 mg/dL). Liver Function Tests: Recent Labs  Lab 02/02/20 1125  02/03/20 0317  AST 13* 15  ALT 12 14  ALKPHOS 65 78  BILITOT 0.9 0.6  PROT 6.2* 6.7  ALBUMIN 2.6* 2.6*   No results for input(s): LIPASE, AMYLASE in the last 168 hours. No results for input(s): AMMONIA in the last 168 hours. Coagulation Profile: Recent Labs  Lab 02/02/20 1125 02/03/20 0317  INR 1.7* 2.5*   Cardiac Enzymes: No results for input(s): CKTOTAL, CKMB, CKMBINDEX, TROPONINI in the last 168 hours. BNP (last 3 results) No results for input(s): PROBNP in the last 8760 hours. HbA1C: No results for input(s): HGBA1C in the last 72 hours. CBG: Recent Labs  Lab 02/07/20 1154 02/07/20 1638 02/07/20 2139 02/08/20 0729 02/08/20 1124  GLUCAP 309* 190* 226* 154* 262*   Lipid Profile: No results for input(s): CHOL, HDL, LDLCALC, TRIG, CHOLHDL, LDLDIRECT in the last 72 hours. Thyroid Function Tests: No results for input(s): TSH, T4TOTAL, FREET4, T3FREE, THYROIDAB in the last 72 hours. Anemia Panel: No results for input(s): VITAMINB12, FOLATE, FERRITIN, TIBC, IRON, RETICCTPCT in the last 72 hours. Sepsis Labs: Recent Labs  Lab 02/02/20 1125 02/02/20 1348 02/03/20 0317  02/04/20 0634  PROCALCITON  --   --  >150.00  --   LATICACIDVEN 1.8 1.9 1.6 1.0    Recent Results (from the past 240 hour(s))  Culture, blood (Routine x 2)     Status: Abnormal   Collection Time: 02/02/20 11:41 AM   Specimen: BLOOD  Result Value Ref Range Status   Specimen Description   Final    BLOOD LEFT ANTECUBITAL Performed at [facility], [address], [place], [place] [address]    Special Requests   Final    BOTTLES DRAWN AEROBIC AND ANAEROBIC Blood Culture adequate volume Performed at [facility], [address], [place], [place] [address]    Culture  Setup Time   Final    GRAM NEGATIVE RODS Gram Stain Report Called to,Read Back By and Verified With: HEARN,J AT 0703 ON 02/03/20 BY HUFFINES,S. IN BOTH AEROBIC AND ANAEROBIC BOTTLES Gram Stain Report Called to,Read Back By and Verified With: PREVIOUSLY CALLED @[id number]  08/13/2021KAY CRITICAL RESULT CALLED TO, READ BACK BY AND VERIFIED WITH: PHARMD Alison Stalling 1402 [id number] FCP Performed at [facility], [place] [address], [place], [place] [address]    Culture KLEBSIELLA PNEUMONIAE PROTEUS MIRABILIS  (A)  Final   Report Status 02/05/2020 FINAL  Final   Organism ID, Bacteria KLEBSIELLA PNEUMONIAE  Final   Organism ID, Bacteria PROTEUS MIRABILIS  Final      Susceptibility   Klebsiella pneumoniae - MIC*    AMPICILLIN RESISTANT Resistant     CEFAZOLIN <=4 SENSITIVE Sensitive     CEFEPIME <=0.12 SENSITIVE Sensitive     CEFTAZIDIME <=1 SENSITIVE Sensitive     CEFTRIAXONE <=0.25 SENSITIVE Sensitive     CIPROFLOXACIN <=0.25 SENSITIVE Sensitive     GENTAMICIN <=1 SENSITIVE Sensitive     IMIPENEM <=0.25 SENSITIVE Sensitive     TRIMETH/SULFA >=320 RESISTANT Resistant     AMPICILLIN/SULBACTAM 4 SENSITIVE Sensitive     PIP/TAZO <=4 SENSITIVE Sensitive     * KLEBSIELLA PNEUMONIAE   Proteus mirabilis - MIC*    AMPICILLIN <=2 SENSITIVE Sensitive     CEFAZOLIN 8 SENSITIVE Sensitive     CEFEPIME <=0.12 SENSITIVE Sensitive      CEFTAZIDIME <=1 SENSITIVE Sensitive     CEFTRIAXONE <=0.25 SENSITIVE Sensitive     CIPROFLOXACIN <=0.25 SENSITIVE Sensitive     GENTAMICIN <=1 SENSITIVE Sensitive     IMIPENEM 4  SENSITIVE Sensitive     TRIMETH/SULFA <=20 SENSITIVE Sensitive     AMPICILLIN/SULBACTAM <=2 SENSITIVE Sensitive     PIP/TAZO <=4 SENSITIVE Sensitive     * PROTEUS MIRABILIS  Blood Culture ID Panel (Reflexed)     Status: Abnormal   Collection Time: 02/02/20 11:41 AM  Result Value Ref Range Status   Enterococcus faecalis NOT DETECTED NOT DETECTED Final   Enterococcus Faecium NOT DETECTED NOT DETECTED Final   Listeria monocytogenes NOT DETECTED NOT DETECTED Final   Staphylococcus species NOT DETECTED NOT DETECTED Final   Staphylococcus aureus (BCID) NOT DETECTED NOT DETECTED Final   Staphylococcus epidermidis NOT DETECTED NOT DETECTED Final   Staphylococcus lugdunensis NOT DETECTED NOT DETECTED Final   Streptococcus species NOT DETECTED NOT DETECTED Final   Streptococcus agalactiae NOT DETECTED NOT DETECTED Final   Streptococcus pneumoniae NOT DETECTED NOT DETECTED Final   Streptococcus pyogenes NOT DETECTED NOT DETECTED Final   A.calcoaceticus-baumannii NOT DETECTED NOT DETECTED Final   Bacteroides fragilis NOT DETECTED NOT DETECTED Final   Enterobacterales DETECTED (A) NOT DETECTED Final    Comment: Enterobacterales represent a large order of gram negative bacteria, not a single organism. CRITICAL RESULT CALLED TO, READ BACK BY AND VERIFIED WITH: PHARMD GARRETT C. 1402 [id number] FCP    Enterobacter cloacae complex NOT DETECTED NOT DETECTED Final   Escherichia coli NOT DETECTED NOT DETECTED Final   Klebsiella aerogenes NOT DETECTED NOT DETECTED Final   Klebsiella oxytoca NOT DETECTED NOT DETECTED Final   Klebsiella pneumoniae DETECTED (A) NOT DETECTED Final    Comment: CRITICAL RESULT CALLED TO, READ BACK BY AND VERIFIED WITH: PHARMD GARRETT C. 1402 [id number] FCP    Proteus species NOT DETECTED NOT DETECTED  Final   Salmonella species NOT DETECTED NOT DETECTED Final   Serratia marcescens NOT DETECTED NOT DETECTED Final   Haemophilus influenzae NOT DETECTED NOT DETECTED Final   Neisseria meningitidis NOT DETECTED NOT DETECTED Final   Pseudomonas aeruginosa NOT DETECTED NOT DETECTED Final   Stenotrophomonas maltophilia NOT DETECTED NOT DETECTED Final   Candida albicans NOT DETECTED NOT DETECTED Final   Candida auris NOT DETECTED NOT DETECTED Final   Candida glabrata NOT DETECTED NOT DETECTED Final   Candida krusei NOT DETECTED NOT DETECTED Final   Candida parapsilosis NOT DETECTED NOT DETECTED Final   Candida tropicalis NOT DETECTED NOT DETECTED Final   Cryptococcus neoformans/gattii NOT DETECTED NOT DETECTED Final   CTX-M ESBL NOT DETECTED NOT DETECTED Final   Carbapenem resistance IMP NOT DETECTED NOT DETECTED Final   Carbapenem resistance KPC NOT DETECTED NOT DETECTED Final   Carbapenem resistance NDM NOT DETECTED NOT DETECTED Final   Carbapenem resist OXA 48 LIKE NOT DETECTED NOT DETECTED Final   Carbapenem resistance VIM NOT DETECTED NOT DETECTED Final    Comment: Performed at [facility], [address] [place], [place] [address]  SARS Coronavirus 2 by RT PCR (hospital order, performed in [facility] hospital lab) Nasopharyngeal Nasopharyngeal Swab     Status: None   Collection Time: 02/02/20 12:02 PM   Specimen: Nasopharyngeal Swab  Result Value Ref Range Status   SARS Coronavirus 2 NEGATIVE NEGATIVE Final    Comment: (NOTE) SARS-CoV-2 target nucleic acids are NOT DETECTED.  The SARS-CoV-2 RNA is generally detectable in upper and lower respiratory specimens during the acute phase of infection. The lowest concentration of SARS-CoV-2 viral copies this assay can detect is 250 copies / mL. A negative result does not preclude SARS-CoV-2 infection and should not be used as  the sole basis for treatment or other patient management decisions.  A negative result may occur  with improper specimen collection / handling, submission of specimen other than nasopharyngeal swab, presence of viral mutation(s) within the areas targeted by this assay, and inadequate number of viral copies (<250 copies / mL). A negative result must be combined with clinical observations, patient history, and epidemiological information.  Fact Sheet for Patients:   StrictlyIdeas.no  Fact Sheet for Healthcare Providers: BankingDealers.co.za  This test is not yet approved or  cleared by the [place] FDA and has been authorized for detection and/or diagnosis of SARS-CoV-2 by FDA under an Emergency Use Authorization (EUA).  This EUA will remain in effect (meaning this test can be used) for the duration of the COVID-19 declaration under Section 564(b)(1) of the Act, 21 U.S.C. section 360bbb-3(b)(1), unless the authorization is terminated or revoked sooner.  Performed at [facility], [address], [place], [place] [address]   Urine culture     Status: Abnormal   Collection Time: 02/02/20 12:04 PM   Specimen: In/Out Cath Urine  Result Value Ref Range Status   Specimen Description   Final    IN/OUT CATH URINE Performed at [facility], [address] [place], [place] [address]    Special Requests   Final    NONE Performed at [facility], [address] [place], [place] [address]    Culture (A)  Final    >=100,000 COLONIES/mL KLEBSIELLA PNEUMONIAE >=100,000 COLONIES/mL PROTEUS MIRABILIS    Report Status 02/04/2020 FINAL  Final   Organism ID, Bacteria KLEBSIELLA PNEUMONIAE (A)  Final   Organism ID, Bacteria PROTEUS MIRABILIS (A)  Final      Susceptibility   Klebsiella pneumoniae - MIC*    AMPICILLIN RESISTANT Resistant     CEFAZOLIN <=4 SENSITIVE Sensitive     CEFTRIAXONE <=0.25 SENSITIVE Sensitive     CIPROFLOXACIN <=0.25 SENSITIVE Sensitive     GENTAMICIN <=1 SENSITIVE Sensitive     IMIPENEM <=0.25 SENSITIVE Sensitive      NITROFURANTOIN 64 INTERMEDIATE Intermediate     TRIMETH/SULFA >=320 RESISTANT Resistant     AMPICILLIN/SULBACTAM 4 SENSITIVE Sensitive     PIP/TAZO <=4 SENSITIVE Sensitive     * >=100,000 COLONIES/mL KLEBSIELLA PNEUMONIAE   Proteus mirabilis - MIC*    AMPICILLIN <=2 SENSITIVE Sensitive     CEFAZOLIN 8 SENSITIVE Sensitive     CEFTRIAXONE <=0.25 SENSITIVE Sensitive     CIPROFLOXACIN <=0.25 SENSITIVE Sensitive     GENTAMICIN <=1 SENSITIVE Sensitive     IMIPENEM 4 SENSITIVE Sensitive     NITROFURANTOIN 128 RESISTANT Resistant     TRIMETH/SULFA <=20 SENSITIVE Sensitive     AMPICILLIN/SULBACTAM <=2 SENSITIVE Sensitive     PIP/TAZO <=4 SENSITIVE Sensitive     * >=100,000 COLONIES/mL PROTEUS MIRABILIS  Culture, blood (Routine x 2)     Status: None   Collection Time: 02/02/20 12:34 PM   Specimen: BLOOD  Result Value Ref Range Status   Specimen Description BLOOD RIGHT HAND  Final   Special Requests   Final    BOTTLES DRAWN AEROBIC AND ANAEROBIC Blood Culture adequate volume   Culture   Final    NO GROWTH 6 DAYS Performed at [facility], [address] [place], [place] [address]    Report Status 02/08/2020 FINAL  Final  MRSA PCR Screening     Status: None   Collection Time: 02/03/20  1:41 AM   Specimen: Nasal Mucosa; Nasopharyngeal  Result Value Ref Range Status  MRSA by PCR NEGATIVE NEGATIVE Final    Comment:        The GeneXpert MRSA Assay (FDA approved for NASAL specimens only), is one component of a comprehensive MRSA colonization surveillance program. It is not intended to diagnose MRSA infection nor to guide or monitor treatment for MRSA infections. Performed at [facility], [address], [place], [place] [address]   C Difficile Quick Screen (NO PCR Reflex)     Status: None   Collection Time: 02/05/20  9:32 AM   Specimen: STOOL  Result Value Ref Range Status   C Diff antigen NEGATIVE NEGATIVE Final   C Diff toxin NEGATIVE NEGATIVE Final   C Diff interpretation  No C. difficile detected.  Final    Comment: Performed at [facility], [address], [place], [place] [address]  Gastrointestinal Panel by PCR , Stool     Status: Abnormal   Collection Time: 02/05/20  9:32 AM   Specimen: STOOL  Result Value Ref Range Status   Campylobacter species NOT DETECTED NOT DETECTED Final   Plesimonas shigelloides NOT DETECTED NOT DETECTED Final   Salmonella species NOT DETECTED NOT DETECTED Final   Yersinia enterocolitica NOT DETECTED NOT DETECTED Final   Vibrio species NOT DETECTED NOT DETECTED Final   Vibrio cholerae NOT DETECTED NOT DETECTED Final   Enteroaggregative E coli (EAEC) NOT DETECTED NOT DETECTED Final   Enteropathogenic E coli (EPEC) DETECTED (A) NOT DETECTED Final    Comment: RESULT CALLED TO, READ BACK BY AND VERIFIED WITH: BRANDY MCMILLAN 02/07/20 1314 KLW    Enterotoxigenic E coli (ETEC) NOT DETECTED NOT DETECTED Final   Shiga like toxin producing E coli (STEC) NOT DETECTED NOT DETECTED Final   Shigella/Enteroinvasive E coli (EIEC) NOT DETECTED NOT DETECTED Final   Cryptosporidium NOT DETECTED NOT DETECTED Final   Cyclospora cayetanensis NOT DETECTED NOT DETECTED Final   Entamoeba histolytica NOT DETECTED NOT DETECTED Final   Giardia lamblia NOT DETECTED NOT DETECTED Final   Adenovirus F40/41 NOT DETECTED NOT DETECTED Final   Astrovirus NOT DETECTED NOT DETECTED Final   Norovirus GI/GII NOT DETECTED NOT DETECTED Final   Rotavirus A NOT DETECTED NOT DETECTED Final   Sapovirus (I, II, IV, and V) NOT DETECTED NOT DETECTED Final    Comment: Performed at [facility], [address], [place], [place] [address]         Radiology Studies: DG C-Arm 1-60 Min-No Report  Result Date: 02/06/2020 Fluoroscopy was utilized by the requesting physician.  No radiographic interpretation.        Scheduled Meds: . amLODipine  10 mg Oral Daily  . atorvastatin  40 mg Oral q1800  . carvedilol  12.5 mg Oral BID WC  . Chlorhexidine  Gluconate Cloth  6 each Topical Daily  . citalopram  20 mg Oral Daily  . insulin aspart  0-5 Units Subcutaneous QHS  . insulin aspart  0-9 Units Subcutaneous TID WC  . rivaroxaban  20 mg Oral Q supper   Continuous Infusions: . cefTRIAXone (ROCEPHIN)  IV 2 g (02/08/20 1427)     LOS: 6 days    Time spent: 30 minutes    Kathie Dike, MD [facility]  If 7PM-7AM, please contact night-coverage [email or web address] 02/08/2020, 3:52 PM

## 2020-02-08 NOTE — Progress Notes (Addendum)
Subjective:  Denies abdominal pain. She feels like diarrhea better. Stool output in flexi-seal still around 250cc and has not been emptied per nursing. No melena, brbpr.   Objective: Vital signs in last 24 hours: Temp:  [98.1 F (36.7 C)-98.3 F (36.8 C)] 98.2 F (36.8 C) (08/18 0400) Pulse Rate:  [65-85] 69 (08/18 0800) Resp:  [14-26] 19 (08/18 0800) BP: (93-139)/(45-64) 139/64 (08/18 0800) SpO2:  [96 %-100 %] 100 % (08/18 0800) FiO2 (%):  [35 %] 35 % (08/18 0317) Weight:  [94.7 kg] 94.7 kg (08/18 0500) Last BM Date: 02/07/20 General:   Alert,  Well-developed, well-nourished, pleasant and cooperative in NAD Head:  Normocephalic and atraumatic. Eyes:  Sclera clear, no icterus.  Abdomen:  Soft, nontender and nondistended. Normal bowel sounds, without guarding, and without rebound.   Extremities:  Without clubbing, deformity or edema. Neurologic:  Alert and  oriented x4;  grossly normal neurologically. Skin:  Intact without significant lesions or rashes. Psych:  Alert and cooperative. Normal mood and affect.  Intake/Output from previous day: 08/17 0701 - 08/18 0700 In: 1550.1 [P.O.:360; I.V.:456.9; IV Piggyback:733.2] Out: 4600 [Urine:4600] Intake/Output this shift: No intake/output data recorded.  Lab Results: CBC Recent Labs    02/06/20 0446 02/07/20 0500 02/08/20 0451  WBC 7.7 8.4 8.1  HGB 11.5* 11.1* 10.6*  HCT 36.5 35.7* 34.0*  MCV 83.7 84.2 85.2  PLT 102* 133* 165   BMET Recent Labs    02/06/20 0446 02/07/20 0500 02/08/20 0451  NA 135 138 136  K 3.3* 3.4* 3.7  CL 99 98 100  CO2 28 29 29   GLUCOSE 240* 150* 184*  BUN 8 9 8   CREATININE 0.62 0.61 0.57  CALCIUM 8.1* 8.0* 7.7*   LFTs No results for input(s): BILITOT, BILIDIR, IBILI, ALKPHOS, AST, ALT, PROT, ALBUMIN in the last 72 hours. No results for input(s): LIPASE in the last 72 hours. PT/INR No results for input(s): LABPROT, INR in the last 72 hours.    Imaging Studies: DG Abd 1 View  Result  Date: 02/05/2020 CLINICAL DATA:  Gaseous abdominal distention. EXAM: ABDOMEN - 1 VIEW COMPARISON:  CT abdomen pelvis 09/19/2017. FINDINGS: Gaseous distended loops of small large bowel are demonstrated throughout the abdomen. There appears to be marked colonic distension. Supine evaluation limited for the detection of free intraperitoneal air. Thoracic spine degenerative changes. IMPRESSION: Marked colonic distension. Given the appearance on abdominal radiograph, recommend further evaluation with CT to exclude the possibility of a volvulus or other obstructing cause. These results were called by telephone at the time of interpretation on 02/05/2020 at 9:49 am to provider [facility] , who verbally acknowledged these results. Electronically Signed   By: Lovey Newcomer M.D.   On: 02/05/2020 09:49   CT ABDOMEN PELVIS W CONTRAST  Result Date: 02/05/2020 CLINICAL DATA:  Abdominal distension, possible bowel obstruction. EXAM: CT ABDOMEN AND PELVIS WITH CONTRAST TECHNIQUE: Multidetector CT imaging of the abdomen and pelvis was performed using the standard protocol following bolus administration of intravenous contrast. CONTRAST:  115mL OMNIPAQUE IOHEXOL 300 MG/ML  SOLN COMPARISON:  09/19/2017 FINDINGS: Lower chest: Small bilateral pleural effusions with associated bibasilar atelectasis. Mild cardiomegaly with small pericardial effusion. Calcified plaque over the left anterior descending and right coronary arteries. Minimal calcified plaque over the descending thoracic aorta. Hepatobiliary: Liver and biliary tree are normal. Possible minimal layering sludge versus noncalcified gallstones. Pancreas: Normal. Spleen: Multiple punctate calcified granulomas. Adrenals/Urinary Tract: 1.3 cm bulbous appearance to the medial limb of the right adrenal gland. Left adrenal  gland is normal. Kidneys are normal in size. No left renal mass or hydronephrosis. No left-sided nephrolithiasis. There is right-sided nephrolithiasis with a 1.6 cm  stone over the upper pole. There are a few right renal cysts unchanged. Mild right-sided hydronephrosis. Mild prominence of the right ureter containing 2 adjacent stones over the distal ureter at and just above the UVJ with the larger just above the UVJ measuring 4 mm and smaller stone at the UVJ measuring 2 mm. Left ureter and bladder are normal. Stomach/Bowel: Stomach and small bowel are normal. Previous appendectomy. Rectal tube is present. There is a redundant sigmoid colon. Mild prominence of the sigmoid colon measuring 6.2 cm in the mid abdomen right of midline. Sigmoid colon proximal and distal to this air-filled segment is contracted. No inflammatory change or obstruction to suggest volvulus. Vascular/Lymphatic: Calcified plaque over the abdominal aorta which is normal in caliber. No adenopathy. Reproductive: Uterus and right ovary are normal. There is a simple appearing 7.1 cm left ovarian cyst unchanged. Other: Minimal free pelvic fluid. Musculoskeletal: Degenerative change of the spine and hips. IMPRESSION: 1. Right-sided nephrolithiasis. Two adjacent stones over the distal right ureter at and just above the UVJ with the larger measuring 4 mm and smaller stone at the UVJ measuring 2 mm. Low-grade obstruction with mild right-sided hydronephrosis. 2. Right renal cysts unchanged. 3. Stable 7.1 cm simple appearing left ovarian cyst. Recommend pelvic ultrasound for further evaluation. 4. Possible minimal layering sludge versus noncalcified gallstones. 5. Aortic atherosclerosis. Atherosclerotic coronary artery disease. Mild cardiomegaly with small pericardial effusion. 6. Small bilateral pleural effusions with associated bibasilar atelectasis. 7. Possible 1.3 cm right adrenal nodule likely adenoma. Recommend follow-up adrenal CT 1 year. Aortic Atherosclerosis (ICD10-I70.0). Electronically Signed   By: Marin Olp M.D.   On: 02/05/2020 17:49   DG Chest Port 1 View  Result Date: 02/02/2020 CLINICAL DATA:   Altered mental status EXAM: PORTABLE CHEST 1 VIEW COMPARISON:  2019 FINDINGS: Tracheostomy device is present. Patient is rotated. Stable chronic interstitial prominence. No significant pleural effusion. No pneumothorax. Similar cardiomediastinal contours with probable mild cardiomegaly. IMPRESSION: No acute process in the chest. Electronically Signed   By: Macy Mis M.D.   On: 02/02/2020 12:43   DG C-Arm 1-60 Min-No Report  Result Date: 02/06/2020 Fluoroscopy was utilized by the requesting physician.  No radiographic interpretation.  [2 weeks]   Assessment:  64 year old female with multiple comorbidities, admitted with severe sepsis in the setting of UTI, bacteremia, acute renal injury, severe hypokalemia in the setting of chronic diarrhea.  CT with findings of nephrolithiasis to right kidney and mild hydronephrosis with plans for stent placement on August 17.  GI consulted for ongoing diarrhea.  Patient has a history of chronic diarrhea of more than 5 watery stools daily as well as nocturnal stools dating back at least to 2016.  No prior GI evaluation.  Initial abdominal x-ray showed marked colonic distention.  CT abdomen pelvis with contrast showed redundant sigmoid colon, mild prominence of sigmoid measuring 6.2 cm in mid abdomen right of midline, sigmoid proximal and distal to this is contracted, no inflammatory changes or obstruction suggesting volvulus.  Celiac serologies negative.  C. difficile negative.  GI pathogen panel with enteropathogenic E. coli.  Clearly this does not explain her chronic diarrhea, additional considerations for chronic diarrhea include microscopic colitis, pancreatic insufficiency, SIBO, IBD.  Plan: Outpatient colonoscopy once her acute illness, to include random colon biopsies. We will make arrangements for follow up.  Will follow peripherally.  Kerry [name]  S. Bernarda Caffey [facility] [phone] 8/18/202112:13 PM     LOS: 6 days     Agree with the above findings as written. The patient was presented to me by the APP (Advanced Practice Provider) and I have also seen and examined the patient independently. Please see my key portion of the encounter as documented.  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  Plan for outpatient colonoscopy to rule out microscopic colitis once she recovers from acute illness.. GI to follow peripherally.  Please call with any questions or concerns.

## 2020-02-08 NOTE — Telephone Encounter (Signed)
Please arrange for hospital follow up in 3-4 weeks for diarrhea.

## 2020-02-09 DIAGNOSIS — N390 Urinary tract infection, site not specified: Secondary | ICD-10-CM

## 2020-02-09 DIAGNOSIS — R7881 Bacteremia: Secondary | ICD-10-CM

## 2020-02-09 LAB — BASIC METABOLIC PANEL
Anion gap: 10 (ref 5–15)
BUN: 8 mg/dL (ref 8–23)
CO2: 30 mmol/L (ref 22–32)
Calcium: 8.4 mg/dL — ABNORMAL LOW (ref 8.9–10.3)
Chloride: 95 mmol/L — ABNORMAL LOW (ref 98–111)
Creatinine, Ser: 0.56 mg/dL (ref 0.44–1.00)
GFR calc Af Amer: >60 mL/min (ref >60)
GFR calc non Af Amer: >60 mL/min (ref >60)
Glucose, Bld: 265 mg/dL — ABNORMAL HIGH (ref 70–99)
Potassium: 3.6 mmol/L (ref 3.5–5.1)
Sodium: 135 mmol/L (ref 135–145)

## 2020-02-09 LAB — GLUCOSE, CAPILLARY
Glucose-Capillary: 196 mg/dL — ABNORMAL HIGH (ref 70–99)
Glucose-Capillary: 296 mg/dL — ABNORMAL HIGH (ref 70–99)

## 2020-02-09 MED ORDER — LOPERAMIDE HCL 2 MG PO CAPS: 2.0000 mg | ORAL_CAPSULE | Freq: Four times a day (QID) | ORAL | 0 refills | Status: DC | PRN

## 2020-02-09 MED ORDER — INSULIN GLARGINE 100 UNIT/ML SOLOSTAR PEN
10.0000 [IU] | PEN_INJECTOR | Freq: Every day | SUBCUTANEOUS | 1 refills | Status: DC
Start: 2020-02-09 — End: 2021-10-02

## 2020-02-09 NOTE — Progress Notes (Signed)
Inpatient Diabetes Program Recommendations  AACE/ADA: New Consensus Statement on Inpatient Glycemic Control   Target Ranges:  Prepandial:   less than 140 mg/dL      Peak postprandial:   less than 180 mg/dL (1-2 hours)      Critically ill patients:  140 - 180 mg/dL   Results for CHAUNICE, OBIE (MRN [id number]) as of 02/09/2020 13:36  Ref. Range 02/08/2020 07:29 02/08/2020 11:24 02/08/2020 16:24 02/08/2020 21:24 02/09/2020 07:26 02/09/2020 11:50  Glucose-Capillary Latest Ref Range: 70 - 99 mg/dL 154 (H) 262 (H) 220 (H) 246 (H) 196 (H) 296 (H)   Review of Glycemic Control  Diabetes history: DM2 Outpatient Diabetes medications: Lantus 30 units QHS Current orders for Inpatient glycemic control: Novolog 0-9 units TID with meals, Novolog 0-5 units QHS  Inpatient Diabetes Program Recommendations:    Insulin-Meal Coverage: Please consider ordering Novolog 3 units TID with meals for meal coverage if patient eats at least 50% of meals.  Thanks, Barnie Alderman, RN, MSN, CDE Diabetes Coordinator Inpatient Diabetes Program [phone] (Team Pager from 8am to 5pm)

## 2020-02-09 NOTE — Progress Notes (Signed)
Patient discharged to home per order via EMS. AVS gone over with patient and patient verbalizes understanding. No c/o pain or discomfort. VSS. All patient belongings sent with patient.

## 2020-02-09 NOTE — Discharge Summary (Signed)
Physician Discharge Summary  Kerry [name]:[id number] DOB: 22-Dec-1955 DOA: 02/02/2020  PCP: Blair Heys, PA-C  Admit date: 02/02/2020 Discharge date: 02/09/2020  Admitted From: home Disposition:  home  Recommendations for Outpatient Follow-up:  1. Follow up with PCP in 1-2 weeks 2. Please obtain BMP/CBC in one week 3. Follow up with urology in 2 weeks 4. Follow up with gastroenterology in 2 weeks to be considered for colonsocopy  Home Health: Equipment/Devices:  Discharge Condition:stable CODE STATUS:full code Diet recommendation: heart healthy  Brief/Interim Summary: Per HPI: Kerry [name] a 64 y.o.femalewith medical history significant forleft-sided CVA with residual right-sided deficits, prior PE/DVT, CAD, hypertension, hyperlipidemia, and type 2 diabeteswho presented to the ED via EMS with altered mentation. Apparently she was with her husband at home who noticed that she suddenly collapsed and became quite confused. Her son came to the house to evaluate her and she was noted to be somewhat diaphoretic and had called EMS. Patient claims that she was having some burning with urination over the last couple days and did have some diarrhea as well.  -Patient was admitted with severe sepsis secondary to UTI along with associated AKI and severe hypokalemia in the setting of chronic diarrhea  Discharge Diagnoses:  Active Problems:   Severe sepsis (HCC)   Diarrhea  Severe sepsis secondary toKlebsiella and ProteusUTIwith associated bacteremia -improving -She completed a total of 7 days of IV ceftriaxone and has no further fevers or leukocytosis  Abdominal distentionwithongoing liquid stools -Suspicion for microscopic colitis -CT abdomenwith mild prominence of sigmoid colon -Cdiff negative -GI panel positive for E. coli, treated with azithromycin -TSH 1.46 -Celiac panel pending -Imodium recommended by GI for now -Appreciate further GI recommendations  today and may need eventual colonoscopy  Right-sided nephrolithiasis with mild hydronephrosis -Status post cystoscopy with right stent placement on 8/16 -Appreciate further urology recommendations -she will follow up with urology as an outpatient  Severe AKI likely secondary tosepsis-resolved -treated with  IV fluid -Vigorous urine output noted   Hypokalemia-persistent -Related to ongoing liquid bowel movements -Imodium as needed -Replace   Hypomagnesemia -Replaced  Thrombocytopenia-improving -Likely secondary to sepsis physiology -Continue on Xarelto with no active bleeding noted  Mild hyponatremia-resolved -Continue monitoring  Prior left-sided CVA with residual deficits -Prior tracheostomy from this episode  Prior PE/DVT -Continue anticoagulation with Xarelto  History of hypertension -Continue on amlodipine and Coreg  History of dyslipidemia -Continue statin  History of type 2 diabetes-with mild hyperglycemia -blood sugars stable in the hospital, continue on basal insulin -Hemoglobin A1c 8.9%  Right adrenal nodule -Incidentally noted on abdominal CT 8/15 -Likely adenoma -Follow-up CT scan in 1 year  Discharge Instructions  Discharge Instructions    Diet - low sodium heart healthy   Complete by: As directed    Increase activity slowly   Complete by: As directed      Allergies as of 02/09/2020      Reactions   Codeine Nausea Only   Nausea?-- "made her sick"   Oxycodone Nausea Only   Sick    Oxycontin [oxycodone Hcl] Nausea Only   Sick       Medication List    STOP taking these medications   pravastatin 40 MG tablet Commonly known as: PRAVACHOL     TAKE these medications   amLODipine 10 MG tablet Commonly known as: NORVASC Take 1 tablet (10 mg total) by mouth daily.   atorvastatin 40 MG tablet Commonly known as: LIPITOR Take 1 tablet (40 mg total) by mouth daily  at 6 PM.   baclofen 10 MG tablet Commonly known as:  LIORESAL Take 10 mg by mouth 2 (two) times daily.   carvedilol 12.5 MG tablet Commonly known as: COREG Take 1 tablet (12.5 mg total) by mouth 2 (two) times daily with a meal.   citalopram 20 MG tablet Commonly known as: CELEXA Take 20 mg by mouth daily.   [employer] D3 125 MCG (5000 UT) capsule Generic drug: Cholecalciferol Take 5,000 Units by mouth daily.   fluticasone 50 MCG/ACT nasal spray Commonly known as: Flonase Place 2 sprays into both nostrils daily. What changed:   when to take this  reasons to take this   insulin glargine 100 UNIT/ML Solostar Pen Commonly known as: LANTUS Inject 10 Units into the skin daily at 10 pm. What changed: how much to take   Insulin Pen Needle 32G X 4 MM Misc Use with insulin pen to dispense insulin daily   lisinopril 10 MG tablet Commonly known as: ZESTRIL Take 1 tablet (10 mg total) by mouth daily.   loperamide 2 MG capsule Commonly known as: IMODIUM Take 1 capsule (2 mg total) by mouth every 6 (six) hours as needed for diarrhea or loose stools.   rivaroxaban 20 MG Tabs tablet Commonly known as: XARELTO Take 1 tablet (20 mg total) by mouth daily with supper.   traMADol 50 MG tablet Commonly known as: ULTRAM Take 50 mg by mouth every 6 (six) hours as needed for moderate pain.       Follow-up Information    Eloise Harman, DO. Schedule an appointment as soon as possible for a visit in 2 week(s).   Specialty: Internal Medicine Why: follow up for colonoscopy Contact information: [place] [address] [phone]        Cleon Gustin, MD. Schedule an appointment as soon as possible for a visit in 1 month(s).   Specialty: Urology Contact information: [address] [place] [address] [phone]              Allergies  Allergen Reactions  . Codeine Nausea Only    Nausea?-- "made her sick"  . Oxycodone Nausea Only    Sick   . Oxycontin [Oxycodone Hcl] Nausea Only    Sick      Consultatations:  Urology  gastroenterology   Procedures/Studies: DG Abd 1 View  Result Date: 02/05/2020 CLINICAL DATA:  Gaseous abdominal distention. EXAM: ABDOMEN - 1 VIEW COMPARISON:  CT abdomen pelvis 09/19/2017. FINDINGS: Gaseous distended loops of small large bowel are demonstrated throughout the abdomen. There appears to be marked colonic distension. Supine evaluation limited for the detection of free intraperitoneal air. Thoracic spine degenerative changes. IMPRESSION: Marked colonic distension. Given the appearance on abdominal radiograph, recommend further evaluation with CT to exclude the possibility of a volvulus or other obstructing cause. These results were called by telephone at the time of interpretation on 02/05/2020 at 9:49 am to provider [facility] , who verbally acknowledged these results. Electronically Signed   By: Lovey Newcomer M.D.   On: 02/05/2020 09:49   CT ABDOMEN PELVIS W CONTRAST  Result Date: 02/05/2020 CLINICAL DATA:  Abdominal distension, possible bowel obstruction. EXAM: CT ABDOMEN AND PELVIS WITH CONTRAST TECHNIQUE: Multidetector CT imaging of the abdomen and pelvis was performed using the standard protocol following bolus administration of intravenous contrast. CONTRAST:  171mL OMNIPAQUE IOHEXOL 300 MG/ML  SOLN COMPARISON:  09/19/2017 FINDINGS: Lower chest: Small bilateral pleural effusions with associated bibasilar atelectasis. Mild cardiomegaly  with small pericardial effusion. Calcified plaque over the left anterior descending and right coronary arteries. Minimal calcified plaque over the descending thoracic aorta. Hepatobiliary: Liver and biliary tree are normal. Possible minimal layering sludge versus noncalcified gallstones. Pancreas: Normal. Spleen: Multiple punctate calcified granulomas. Adrenals/Urinary Tract: 1.3 cm bulbous appearance to the medial limb of the right adrenal gland. Left adrenal gland is normal. Kidneys are normal in size. No left renal  mass or hydronephrosis. No left-sided nephrolithiasis. There is right-sided nephrolithiasis with a 1.6 cm stone over the upper pole. There are a few right renal cysts unchanged. Mild right-sided hydronephrosis. Mild prominence of the right ureter containing 2 adjacent stones over the distal ureter at and just above the UVJ with the larger just above the UVJ measuring 4 mm and smaller stone at the UVJ measuring 2 mm. Left ureter and bladder are normal. Stomach/Bowel: Stomach and small bowel are normal. Previous appendectomy. Rectal tube is present. There is a redundant sigmoid colon. Mild prominence of the sigmoid colon measuring 6.2 cm in the mid abdomen right of midline. Sigmoid colon proximal and distal to this air-filled segment is contracted. No inflammatory change or obstruction to suggest volvulus. Vascular/Lymphatic: Calcified plaque over the abdominal aorta which is normal in caliber. No adenopathy. Reproductive: Uterus and right ovary are normal. There is a simple appearing 7.1 cm left ovarian cyst unchanged. Other: Minimal free pelvic fluid. Musculoskeletal: Degenerative change of the spine and hips. IMPRESSION: 1. Right-sided nephrolithiasis. Two adjacent stones over the distal right ureter at and just above the UVJ with the larger measuring 4 mm and smaller stone at the UVJ measuring 2 mm. Low-grade obstruction with mild right-sided hydronephrosis. 2. Right renal cysts unchanged. 3. Stable 7.1 cm simple appearing left ovarian cyst. Recommend pelvic ultrasound for further evaluation. 4. Possible minimal layering sludge versus noncalcified gallstones. 5. Aortic atherosclerosis. Atherosclerotic coronary artery disease. Mild cardiomegaly with small pericardial effusion. 6. Small bilateral pleural effusions with associated bibasilar atelectasis. 7. Possible 1.3 cm right adrenal nodule likely adenoma. Recommend follow-up adrenal CT 1 year. Aortic Atherosclerosis (ICD10-I70.0). Electronically Signed   By:  Marin Olp M.D.   On: 02/05/2020 17:49   DG Chest Port 1 View  Result Date: 02/02/2020 CLINICAL DATA:  Altered mental status EXAM: PORTABLE CHEST 1 VIEW COMPARISON:  2019 FINDINGS: Tracheostomy device is present. Patient is rotated. Stable chronic interstitial prominence. No significant pleural effusion. No pneumothorax. Similar cardiomediastinal contours with probable mild cardiomegaly. IMPRESSION: No acute process in the chest. Electronically Signed   By: Macy Mis M.D.   On: 02/02/2020 12:43   DG C-Arm 1-60 Min-No Report  Result Date: 02/06/2020 Fluoroscopy was utilized by the requesting physician.  No radiographic interpretation.       Subjective: Overall diarrhea is back to baseline. No nausea or vomiting. Feeling better and excited to go home  Discharge Exam: Vitals:   02/09/20 0900 02/09/20 1000 02/09/20 1100 02/09/20 1200  BP: (!) 141/47 (!) 131/58 (!) 132/54 (!) 148/55  Pulse:   77 81  Resp: 17 11 16  (!) 24  Temp:      TempSrc:      SpO2:   96% 94%  Weight:      Height:        General: Pt is alert, awake, not in acute distress Cardiovascular: RRR, S1/S2 +, no rubs, no gallops Respiratory: CTA bilaterally, no wheezing, no rhonchi Abdominal: Soft, NT, ND, bowel sounds + Extremities: no edema, no cyanosis    The results of significant diagnostics  from this hospitalization (including imaging, microbiology, ancillary and laboratory) are listed below for reference.     Microbiology: Recent Results (from the past 240 hour(s))  Culture, blood (Routine x 2)     Status: Abnormal   Collection Time: 02/02/20 11:41 AM   Specimen: BLOOD  Result Value Ref Range Status   Specimen Description   Final    BLOOD LEFT ANTECUBITAL Performed at [facility], [address] [place], [place] [address]    Special Requests   Final    BOTTLES DRAWN AEROBIC AND ANAEROBIC Blood Culture adequate volume Performed at [address] [place], [place] [address]     Culture  Setup Time   Final    GRAM NEGATIVE RODS Gram Stain Report Called to,Read Back By and Verified With: HEARN,J AT 0703 ON 02/03/20 BY HUFFINES,S. IN BOTH AEROBIC AND ANAEROBIC BOTTLES Gram Stain Report Called to,Read Back By and Verified With: PREVIOUSLY CALLED @[id number]  08/13/2021KAY CRITICAL RESULT CALLED TO, READ BACK BY AND VERIFIED WITH: PHARMD Alison Stalling 1402 [id number] FCP Performed at [facility], [place] [address], [place], [place] [address]    Culture KLEBSIELLA PNEUMONIAE PROTEUS MIRABILIS  (A)  Final   Report Status 02/05/2020 FINAL  Final   Organism ID, Bacteria KLEBSIELLA PNEUMONIAE  Final   Organism ID, Bacteria PROTEUS MIRABILIS  Final      Susceptibility   Klebsiella pneumoniae - MIC*    AMPICILLIN RESISTANT Resistant     CEFAZOLIN <=4 SENSITIVE Sensitive     CEFEPIME <=0.12 SENSITIVE Sensitive     CEFTAZIDIME <=1 SENSITIVE Sensitive     CEFTRIAXONE <=0.25 SENSITIVE Sensitive     CIPROFLOXACIN <=0.25 SENSITIVE Sensitive     GENTAMICIN <=1 SENSITIVE Sensitive     IMIPENEM <=0.25 SENSITIVE Sensitive     TRIMETH/SULFA >=320 RESISTANT Resistant     AMPICILLIN/SULBACTAM 4 SENSITIVE Sensitive     PIP/TAZO <=4 SENSITIVE Sensitive     * KLEBSIELLA PNEUMONIAE   Proteus mirabilis - MIC*    AMPICILLIN <=2 SENSITIVE Sensitive     CEFAZOLIN 8 SENSITIVE Sensitive     CEFEPIME <=0.12 SENSITIVE Sensitive     CEFTAZIDIME <=1 SENSITIVE Sensitive     CEFTRIAXONE <=0.25 SENSITIVE Sensitive     CIPROFLOXACIN <=0.25 SENSITIVE Sensitive     GENTAMICIN <=1 SENSITIVE Sensitive     IMIPENEM 4 SENSITIVE Sensitive     TRIMETH/SULFA <=20 SENSITIVE Sensitive     AMPICILLIN/SULBACTAM <=2 SENSITIVE Sensitive     PIP/TAZO <=4 SENSITIVE Sensitive     * PROTEUS MIRABILIS  Blood Culture ID Panel (Reflexed)     Status: Abnormal   Collection Time: 02/02/20 11:41 AM  Result Value Ref Range Status   Enterococcus faecalis NOT DETECTED NOT DETECTED Final   Enterococcus Faecium NOT DETECTED  NOT DETECTED Final   Listeria monocytogenes NOT DETECTED NOT DETECTED Final   Staphylococcus species NOT DETECTED NOT DETECTED Final   Staphylococcus aureus (BCID) NOT DETECTED NOT DETECTED Final   Staphylococcus epidermidis NOT DETECTED NOT DETECTED Final   Staphylococcus lugdunensis NOT DETECTED NOT DETECTED Final   Streptococcus species NOT DETECTED NOT DETECTED Final   Streptococcus agalactiae NOT DETECTED NOT DETECTED Final   Streptococcus pneumoniae NOT DETECTED NOT DETECTED Final   Streptococcus pyogenes NOT DETECTED NOT DETECTED Final   A.calcoaceticus-baumannii NOT DETECTED NOT DETECTED Final   Bacteroides fragilis NOT DETECTED NOT DETECTED Final   Enterobacterales DETECTED (A) NOT DETECTED Final    Comment: Enterobacterales represent a large order of gram negative bacteria,  not a single organism. CRITICAL RESULT CALLED TO, READ BACK BY AND VERIFIED WITH: PHARMD GARRETT C. 1402 [id number] FCP    Enterobacter cloacae complex NOT DETECTED NOT DETECTED Final   Escherichia coli NOT DETECTED NOT DETECTED Final   Klebsiella aerogenes NOT DETECTED NOT DETECTED Final   Klebsiella oxytoca NOT DETECTED NOT DETECTED Final   Klebsiella pneumoniae DETECTED (A) NOT DETECTED Final    Comment: CRITICAL RESULT CALLED TO, READ BACK BY AND VERIFIED WITH: PHARMD GARRETT C. 1402 [id number] FCP    Proteus species NOT DETECTED NOT DETECTED Final   Salmonella species NOT DETECTED NOT DETECTED Final   Serratia marcescens NOT DETECTED NOT DETECTED Final   Haemophilus influenzae NOT DETECTED NOT DETECTED Final   Neisseria meningitidis NOT DETECTED NOT DETECTED Final   Pseudomonas aeruginosa NOT DETECTED NOT DETECTED Final   Stenotrophomonas maltophilia NOT DETECTED NOT DETECTED Final   Candida albicans NOT DETECTED NOT DETECTED Final   Candida auris NOT DETECTED NOT DETECTED Final   Candida glabrata NOT DETECTED NOT DETECTED Final   Candida krusei NOT DETECTED NOT DETECTED Final   Candida parapsilosis NOT  DETECTED NOT DETECTED Final   Candida tropicalis NOT DETECTED NOT DETECTED Final   Cryptococcus neoformans/gattii NOT DETECTED NOT DETECTED Final   CTX-M ESBL NOT DETECTED NOT DETECTED Final   Carbapenem resistance IMP NOT DETECTED NOT DETECTED Final   Carbapenem resistance KPC NOT DETECTED NOT DETECTED Final   Carbapenem resistance NDM NOT DETECTED NOT DETECTED Final   Carbapenem resist OXA 48 LIKE NOT DETECTED NOT DETECTED Final   Carbapenem resistance VIM NOT DETECTED NOT DETECTED Final    Comment: Performed at [facility], [address] [place], [place] [address]  SARS Coronavirus 2 by RT PCR (hospital order, performed in [facility] hospital lab) Nasopharyngeal Nasopharyngeal Swab     Status: None   Collection Time: 02/02/20 12:02 PM   Specimen: Nasopharyngeal Swab  Result Value Ref Range Status   SARS Coronavirus 2 NEGATIVE NEGATIVE Final    Comment: (NOTE) SARS-CoV-2 target nucleic acids are NOT DETECTED.  The SARS-CoV-2 RNA is generally detectable in upper and lower respiratory specimens during the acute phase of infection. The lowest concentration of SARS-CoV-2 viral copies this assay can detect is 250 copies / mL. A negative result does not preclude SARS-CoV-2 infection and should not be used as the sole basis for treatment or other patient management decisions.  A negative result may occur with improper specimen collection / handling, submission of specimen other than nasopharyngeal swab, presence of viral mutation(s) within the areas targeted by this assay, and inadequate number of viral copies (<250 copies / mL). A negative result must be combined with clinical observations, patient history, and epidemiological information.  Fact Sheet for Patients:   StrictlyIdeas.no  Fact Sheet for Healthcare Providers: BankingDealers.co.za  This test is not yet approved or  cleared by the [place] FDA and has been  authorized for detection and/or diagnosis of SARS-CoV-2 by FDA under an Emergency Use Authorization (EUA).  This EUA will remain in effect (meaning this test can be used) for the duration of the COVID-19 declaration under Section 564(b)(1) of the Act, 21 U.S.C. section 360bbb-3(b)(1), unless the authorization is terminated or revoked sooner.  Performed at [facility], [address] [place], [place] [address]   Urine culture     Status: Abnormal   Collection Time: 02/02/20 12:04 PM   Specimen: In/Out Cath Urine  Result Value Ref Range Status   Specimen Description  Final    IN/OUT CATH URINE Performed at [facility], [address] [place], [place] [address]    Special Requests   Final    NONE Performed at [facility], [address] [place], [place] [address]    Culture (A)  Final    >=100,000 COLONIES/mL KLEBSIELLA PNEUMONIAE >=100,000 COLONIES/mL PROTEUS MIRABILIS    Report Status 02/04/2020 FINAL  Final   Organism ID, Bacteria KLEBSIELLA PNEUMONIAE (A)  Final   Organism ID, Bacteria PROTEUS MIRABILIS (A)  Final      Susceptibility   Klebsiella pneumoniae - MIC*    AMPICILLIN RESISTANT Resistant     CEFAZOLIN <=4 SENSITIVE Sensitive     CEFTRIAXONE <=0.25 SENSITIVE Sensitive     CIPROFLOXACIN <=0.25 SENSITIVE Sensitive     GENTAMICIN <=1 SENSITIVE Sensitive     IMIPENEM <=0.25 SENSITIVE Sensitive     NITROFURANTOIN 64 INTERMEDIATE Intermediate     TRIMETH/SULFA >=320 RESISTANT Resistant     AMPICILLIN/SULBACTAM 4 SENSITIVE Sensitive     PIP/TAZO <=4 SENSITIVE Sensitive     * >=100,000 COLONIES/mL KLEBSIELLA PNEUMONIAE   Proteus mirabilis - MIC*    AMPICILLIN <=2 SENSITIVE Sensitive     CEFAZOLIN 8 SENSITIVE Sensitive     CEFTRIAXONE <=0.25 SENSITIVE Sensitive     CIPROFLOXACIN <=0.25 SENSITIVE Sensitive     GENTAMICIN <=1 SENSITIVE Sensitive     IMIPENEM 4 SENSITIVE Sensitive     NITROFURANTOIN 128 RESISTANT Resistant     TRIMETH/SULFA <=20 SENSITIVE Sensitive      AMPICILLIN/SULBACTAM <=2 SENSITIVE Sensitive     PIP/TAZO <=4 SENSITIVE Sensitive     * >=100,000 COLONIES/mL PROTEUS MIRABILIS  Culture, blood (Routine x 2)     Status: None   Collection Time: 02/02/20 12:34 PM   Specimen: BLOOD  Result Value Ref Range Status   Specimen Description BLOOD RIGHT HAND  Final   Special Requests   Final    BOTTLES DRAWN AEROBIC AND ANAEROBIC Blood Culture adequate volume   Culture   Final    NO GROWTH 6 DAYS Performed at [facility], [address] [place], [place] [address]    Report Status 02/08/2020 FINAL  Final  MRSA PCR Screening     Status: None   Collection Time: 02/03/20  1:41 AM   Specimen: Nasal Mucosa; Nasopharyngeal  Result Value Ref Range Status   MRSA by PCR NEGATIVE NEGATIVE Final    Comment:        The GeneXpert MRSA Assay (FDA approved for NASAL specimens only), is one component of a comprehensive MRSA colonization surveillance program. It is not intended to diagnose MRSA infection nor to guide or monitor treatment for MRSA infections. Performed at [facility], [address], [place], [place] [address]   C Difficile Quick Screen (NO PCR Reflex)     Status: None   Collection Time: 02/05/20  9:32 AM   Specimen: STOOL  Result Value Ref Range Status   C Diff antigen NEGATIVE NEGATIVE Final   C Diff toxin NEGATIVE NEGATIVE Final   C Diff interpretation No C. difficile detected.  Final    Comment: Performed at [facility], [address] [place], [place] [address]  Gastrointestinal Panel by PCR , Stool     Status: Abnormal   Collection Time: 02/05/20  9:32 AM   Specimen: STOOL  Result Value Ref Range Status   Campylobacter species NOT DETECTED NOT DETECTED Final   Plesimonas shigelloides NOT DETECTED NOT DETECTED Final   Salmonella species NOT DETECTED NOT  DETECTED Final   Yersinia enterocolitica NOT DETECTED NOT DETECTED Final   Vibrio species NOT DETECTED NOT DETECTED Final   Vibrio cholerae NOT DETECTED NOT DETECTED  Final   Enteroaggregative E coli (EAEC) NOT DETECTED NOT DETECTED Final   Enteropathogenic E coli (EPEC) DETECTED (A) NOT DETECTED Final    Comment: RESULT CALLED TO, READ BACK BY AND VERIFIED WITH: BRANDY MCMILLAN 02/07/20 1314 KLW    Enterotoxigenic E coli (ETEC) NOT DETECTED NOT DETECTED Final   Shiga like toxin producing E coli (STEC) NOT DETECTED NOT DETECTED Final   Shigella/Enteroinvasive E coli (EIEC) NOT DETECTED NOT DETECTED Final   Cryptosporidium NOT DETECTED NOT DETECTED Final   Cyclospora cayetanensis NOT DETECTED NOT DETECTED Final   Entamoeba histolytica NOT DETECTED NOT DETECTED Final   Giardia lamblia NOT DETECTED NOT DETECTED Final   Adenovirus F40/41 NOT DETECTED NOT DETECTED Final   Astrovirus NOT DETECTED NOT DETECTED Final   Norovirus GI/GII NOT DETECTED NOT DETECTED Final   Rotavirus A NOT DETECTED NOT DETECTED Final   Sapovirus (I, II, IV, and V) NOT DETECTED NOT DETECTED Final    Comment: Performed at [facility], [address] [place], [place] [address]     Labs: BNP (last 3 results) No results for input(s): BNP in the last 8760 hours. Basic Metabolic Panel: Recent Labs  Lab 02/04/20 0634 02/04/20 1632 02/05/20 0556 02/06/20 0446 02/07/20 0500 02/08/20 0451 02/09/20 0904  NA 140  --  136 135 138 136 135  K 2.5*   < > 3.4* 3.3* 3.4* 3.7 3.6  CL 105  --  103 99 98 100 95*  CO2 24  --  25 28 29 29 30   GLUCOSE 96  --  166* 240* 150* 184* 265*  BUN 13  --  8 8 9 8 8   CREATININE 0.92  --  0.73 0.62 0.61 0.57 0.56  CALCIUM 7.7*  --  7.7* 8.1* 8.0* 7.7* 8.4*  MG 1.4*  --  1.6* 1.4* 1.6* 1.6*  --    < > = values in this interval not displayed.   Liver Function Tests: Recent Labs  Lab 02/03/20 0317  AST 15  ALT 14  ALKPHOS 78  BILITOT 0.6  PROT 6.7  ALBUMIN 2.6*   No results for input(s): LIPASE, AMYLASE in the last 168 hours. No results for input(s): AMMONIA in the last 168 hours. CBC: Recent Labs  Lab 02/04/20 0634  02/05/20 0556 02/06/20 0446 02/07/20 0500 02/08/20 0451  WBC 10.7* 8.4 7.7 8.4 8.1  HGB 12.4 11.4* 11.5* 11.1* 10.6*  HCT 38.8 36.2 36.5 35.7* 34.0*  MCV 82.9 83.6 83.7 84.2 85.2  PLT 86* 89* 102* 133* 165   Cardiac Enzymes: No results for input(s): CKTOTAL, CKMB, CKMBINDEX, TROPONINI in the last 168 hours. BNP: Invalid input(s): POCBNP CBG: Recent Labs  Lab 02/08/20 1124 02/08/20 1624 02/08/20 2124 02/09/20 0726 02/09/20 1150  GLUCAP 262* 220* 246* 196* 296*   D-Dimer No results for input(s): DDIMER in the last 72 hours. Hgb A1c No results for input(s): HGBA1C in the last 72 hours. Lipid Profile No results for input(s): CHOL, HDL, LDLCALC, TRIG, CHOLHDL, LDLDIRECT in the last 72 hours. Thyroid function studies No results for input(s): TSH, T4TOTAL, T3FREE, THYROIDAB in the last 72 hours.  Invalid input(s): FREET3 Anemia work up No results for input(s): VITAMINB12, FOLATE, FERRITIN, TIBC, IRON, RETICCTPCT in the last 72 hours. Urinalysis    Component Value Date/Time   COLORURINE BROWN (A) 02/02/2020 1141  APPEARANCEUR TURBID (A) 02/02/2020 1141   LABSPEC 1.020 02/02/2020 1141   PHURINE 6.0 02/02/2020 1141   GLUCOSEU NEGATIVE 02/02/2020 1141   HGBUR LARGE (A) 02/02/2020 1141   BILIRUBINUR NEGATIVE 02/02/2020 1141   KETONESUR NEGATIVE 02/02/2020 1141   PROTEINUR >300 (A) 02/02/2020 1141   UROBILINOGEN 1.0 04/29/2015 0703   NITRITE NEGATIVE 02/02/2020 1141   LEUKOCYTESUR LARGE (A) 02/02/2020 1141   Sepsis Labs Invalid input(s): PROCALCITONIN,  WBC,  LACTICIDVEN Microbiology Recent Results (from the past 240 hour(s))  Culture, blood (Routine x 2)     Status: Abnormal   Collection Time: 02/02/20 11:41 AM   Specimen: BLOOD  Result Value Ref Range Status   Specimen Description   Final    BLOOD LEFT ANTECUBITAL Performed at [facility], [address], [place], [place] [address]    Special Requests   Final    BOTTLES DRAWN AEROBIC AND ANAEROBIC Blood Culture  adequate volume Performed at [facility], [address], [place], [place] [address]    Culture  Setup Time   Final    GRAM NEGATIVE RODS Gram Stain Report Called to,Read Back By and Verified With: HEARN,J AT 0703 ON 02/03/20 BY HUFFINES,S. IN BOTH AEROBIC AND ANAEROBIC BOTTLES Gram Stain Report Called to,Read Back By and Verified With: PREVIOUSLY CALLED @[id number]  08/13/2021KAY CRITICAL RESULT CALLED TO, READ BACK BY AND VERIFIED WITH: PHARMD Alison Stalling [phone] [id number] FCP Performed at [facility], [place] [address], [place], [place] [address]    Culture KLEBSIELLA PNEUMONIAE PROTEUS MIRABILIS  (A)  Final   Report Status 02/05/2020 FINAL  Final   Organism ID, Bacteria KLEBSIELLA PNEUMONIAE  Final   Organism ID, Bacteria PROTEUS MIRABILIS  Final      Susceptibility   Klebsiella pneumoniae - MIC*    AMPICILLIN RESISTANT Resistant     CEFAZOLIN <=4 SENSITIVE Sensitive     CEFEPIME <=0.12 SENSITIVE Sensitive     CEFTAZIDIME <=1 SENSITIVE Sensitive     CEFTRIAXONE <=0.25 SENSITIVE Sensitive     CIPROFLOXACIN <=0.25 SENSITIVE Sensitive     GENTAMICIN <=1 SENSITIVE Sensitive     IMIPENEM <=0.25 SENSITIVE Sensitive     TRIMETH/SULFA >=320 RESISTANT Resistant     AMPICILLIN/SULBACTAM 4 SENSITIVE Sensitive     PIP/TAZO <=4 SENSITIVE Sensitive     * KLEBSIELLA PNEUMONIAE   Proteus mirabilis - MIC*    AMPICILLIN <=2 SENSITIVE Sensitive     CEFAZOLIN 8 SENSITIVE Sensitive     CEFEPIME <=0.12 SENSITIVE Sensitive     CEFTAZIDIME <=1 SENSITIVE Sensitive     CEFTRIAXONE <=0.25 SENSITIVE Sensitive     CIPROFLOXACIN <=0.25 SENSITIVE Sensitive     GENTAMICIN <=1 SENSITIVE Sensitive     IMIPENEM 4 SENSITIVE Sensitive     TRIMETH/SULFA <=20 SENSITIVE Sensitive     AMPICILLIN/SULBACTAM <=2 SENSITIVE Sensitive     PIP/TAZO <=4 SENSITIVE Sensitive     * PROTEUS MIRABILIS  Blood Culture ID Panel (Reflexed)     Status: Abnormal   Collection Time: 02/02/20 11:41 AM  Result Value Ref Range Status    Enterococcus faecalis NOT DETECTED NOT DETECTED Final   Enterococcus Faecium NOT DETECTED NOT DETECTED Final   Listeria monocytogenes NOT DETECTED NOT DETECTED Final   Staphylococcus species NOT DETECTED NOT DETECTED Final   Staphylococcus aureus (BCID) NOT DETECTED NOT DETECTED Final   Staphylococcus epidermidis NOT DETECTED NOT DETECTED Final   Staphylococcus lugdunensis NOT DETECTED NOT DETECTED Final   Streptococcus species NOT DETECTED NOT DETECTED Final   Streptococcus agalactiae  NOT DETECTED NOT DETECTED Final   Streptococcus pneumoniae NOT DETECTED NOT DETECTED Final   Streptococcus pyogenes NOT DETECTED NOT DETECTED Final   A.calcoaceticus-baumannii NOT DETECTED NOT DETECTED Final   Bacteroides fragilis NOT DETECTED NOT DETECTED Final   Enterobacterales DETECTED (A) NOT DETECTED Final    Comment: Enterobacterales represent a large order of gram negative bacteria, not a single organism. CRITICAL RESULT CALLED TO, READ BACK BY AND VERIFIED WITH: PHARMD GARRETT C. [id number] FCP    Enterobacter cloacae complex NOT DETECTED NOT DETECTED Final   Escherichia coli NOT DETECTED NOT DETECTED Final   Klebsiella aerogenes NOT DETECTED NOT DETECTED Final   Klebsiella oxytoca NOT DETECTED NOT DETECTED Final   Klebsiella pneumoniae DETECTED (A) NOT DETECTED Final    Comment: CRITICAL RESULT CALLED TO, READ BACK BY AND VERIFIED WITH: PHARMD GARRETT C. 1402 [id number] FCP    Proteus species NOT DETECTED NOT DETECTED Final   Salmonella species NOT DETECTED NOT DETECTED Final   Serratia marcescens NOT DETECTED NOT DETECTED Final   Haemophilus influenzae NOT DETECTED NOT DETECTED Final   Neisseria meningitidis NOT DETECTED NOT DETECTED Final   Pseudomonas aeruginosa NOT DETECTED NOT DETECTED Final   Stenotrophomonas maltophilia NOT DETECTED NOT DETECTED Final   Candida albicans NOT DETECTED NOT DETECTED Final   Candida auris NOT DETECTED NOT DETECTED Final   Candida glabrata NOT DETECTED NOT  DETECTED Final   Candida krusei NOT DETECTED NOT DETECTED Final   Candida parapsilosis NOT DETECTED NOT DETECTED Final   Candida tropicalis NOT DETECTED NOT DETECTED Final   Cryptococcus neoformans/gattii NOT DETECTED NOT DETECTED Final   CTX-M ESBL NOT DETECTED NOT DETECTED Final   Carbapenem resistance IMP NOT DETECTED NOT DETECTED Final   Carbapenem resistance KPC NOT DETECTED NOT DETECTED Final   Carbapenem resistance NDM NOT DETECTED NOT DETECTED Final   Carbapenem resist OXA 48 LIKE NOT DETECTED NOT DETECTED Final   Carbapenem resistance VIM NOT DETECTED NOT DETECTED Final    Comment: Performed at [facility], [address] [place], [place] [address]  SARS Coronavirus 2 by RT PCR (hospital order, performed in [facility] hospital lab) Nasopharyngeal Nasopharyngeal Swab     Status: None   Collection Time: 02/02/20 12:02 PM   Specimen: Nasopharyngeal Swab  Result Value Ref Range Status   SARS Coronavirus 2 NEGATIVE NEGATIVE Final    Comment: (NOTE) SARS-CoV-2 target nucleic acids are NOT DETECTED.  The SARS-CoV-2 RNA is generally detectable in upper and lower respiratory specimens during the acute phase of infection. The lowest concentration of SARS-CoV-2 viral copies this assay can detect is 250 copies / mL. A negative result does not preclude SARS-CoV-2 infection and should not be used as the sole basis for treatment or other patient management decisions.  A negative result may occur with improper specimen collection / handling, submission of specimen other than nasopharyngeal swab, presence of viral mutation(s) within the areas targeted by this assay, and inadequate number of viral copies (<250 copies / mL). A negative result must be combined with clinical observations, patient history, and epidemiological information.  Fact Sheet for Patients:   StrictlyIdeas.no  Fact Sheet for Healthcare  Providers: BankingDealers.co.za  This test is not yet approved or  cleared by the [place] FDA and has been authorized for detection and/or diagnosis of SARS-CoV-2 by FDA under an Emergency Use Authorization (EUA).  This EUA will remain in effect (meaning this test can be used) for the duration of the COVID-19 declaration under Section  564(b)(1) of the Act, 21 U.S.C. section 360bbb-3(b)(1), unless the authorization is terminated or revoked sooner.  Performed at [facility], [address] [place], [place] [address]   Urine culture     Status: Abnormal   Collection Time: 02/02/20 12:04 PM   Specimen: In/Out Cath Urine  Result Value Ref Range Status   Specimen Description   Final    IN/OUT CATH URINE Performed at [facility], [address] [place], [place] [address]    Special Requests   Final    NONE Performed at [facility], [address] [place], [place] [address]    Culture (A)  Final    >=100,000 COLONIES/mL KLEBSIELLA PNEUMONIAE >=100,000 COLONIES/mL PROTEUS MIRABILIS    Report Status 02/04/2020 FINAL  Final   Organism ID, Bacteria KLEBSIELLA PNEUMONIAE (A)  Final   Organism ID, Bacteria PROTEUS MIRABILIS (A)  Final      Susceptibility   Klebsiella pneumoniae - MIC*    AMPICILLIN RESISTANT Resistant     CEFAZOLIN <=4 SENSITIVE Sensitive     CEFTRIAXONE <=0.25 SENSITIVE Sensitive     CIPROFLOXACIN <=0.25 SENSITIVE Sensitive     GENTAMICIN <=1 SENSITIVE Sensitive     IMIPENEM <=0.25 SENSITIVE Sensitive     NITROFURANTOIN 64 INTERMEDIATE Intermediate     TRIMETH/SULFA >=320 RESISTANT Resistant     AMPICILLIN/SULBACTAM 4 SENSITIVE Sensitive     PIP/TAZO <=4 SENSITIVE Sensitive     * >=100,000 COLONIES/mL KLEBSIELLA PNEUMONIAE   Proteus mirabilis - MIC*    AMPICILLIN <=2 SENSITIVE Sensitive     CEFAZOLIN 8 SENSITIVE Sensitive     CEFTRIAXONE <=0.25 SENSITIVE Sensitive     CIPROFLOXACIN <=0.25 SENSITIVE Sensitive     GENTAMICIN <=1  SENSITIVE Sensitive     IMIPENEM 4 SENSITIVE Sensitive     NITROFURANTOIN 128 RESISTANT Resistant     TRIMETH/SULFA <=20 SENSITIVE Sensitive     AMPICILLIN/SULBACTAM <=2 SENSITIVE Sensitive     PIP/TAZO <=4 SENSITIVE Sensitive     * >=100,000 COLONIES/mL PROTEUS MIRABILIS  Culture, blood (Routine x 2)     Status: None   Collection Time: 02/02/20 12:34 PM   Specimen: BLOOD  Result Value Ref Range Status   Specimen Description BLOOD RIGHT HAND  Final   Special Requests   Final    BOTTLES DRAWN AEROBIC AND ANAEROBIC Blood Culture adequate volume   Culture   Final    NO GROWTH 6 DAYS Performed at [facility], [address], [place],  [address]    Report Status 02/08/2020 FINAL  Final  MRSA PCR Screening     Status: None   Collection Time: 02/03/20  1:41 AM   Specimen: Nasal Mucosa; Nasopharyngeal  Result Value Ref Range Status   MRSA by PCR NEGATIVE NEGATIVE Final    Comment:        The GeneXpert MRSA Assay (FDA approved for NASAL specimens only), is one component of a comprehensive MRSA colonization surveillance program. It is not intended to diagnose MRSA infection nor to guide or monitor treatment for MRSA infections. Performed at [facility], [address], [place],  [address]   C Difficile Quick Screen (NO PCR Reflex)     Status: None   Collection Time: 02/05/20  9:32 AM   Specimen: STOOL  Result Value Ref Range Status   C Diff antigen NEGATIVE NEGATIVE Final   C Diff toxin NEGATIVE NEGATIVE Final   C Diff interpretation No C. difficile detected.  Final    Comment: Performed at [facility], [address] [place], [place] [address]  Gastrointestinal Panel by PCR , Stool     Status: Abnormal   Collection Time: 02/05/20  9:32 AM   Specimen: STOOL  Result Value Ref Range Status   Campylobacter species NOT DETECTED NOT DETECTED Final   Plesimonas shigelloides NOT DETECTED NOT DETECTED Final   Salmonella species NOT DETECTED NOT DETECTED Final    Yersinia enterocolitica NOT DETECTED NOT DETECTED Final   Vibrio species NOT DETECTED NOT DETECTED Final   Vibrio cholerae NOT DETECTED NOT DETECTED Final   Enteroaggregative E coli (EAEC) NOT DETECTED NOT DETECTED Final   Enteropathogenic E coli (EPEC) DETECTED (A) NOT DETECTED Final    Comment: RESULT CALLED TO, READ BACK BY AND VERIFIED WITH: BRANDY MCMILLAN 02/07/20 1314 KLW    Enterotoxigenic E coli (ETEC) NOT DETECTED NOT DETECTED Final   Shiga like toxin producing E coli (STEC) NOT DETECTED NOT DETECTED Final   Shigella/Enteroinvasive E coli (EIEC) NOT DETECTED NOT DETECTED Final   Cryptosporidium NOT DETECTED NOT DETECTED Final   Cyclospora cayetanensis NOT DETECTED NOT DETECTED Final   Entamoeba histolytica NOT DETECTED NOT DETECTED Final   Giardia lamblia NOT DETECTED NOT DETECTED Final   Adenovirus F40/41 NOT DETECTED NOT DETECTED Final   Astrovirus NOT DETECTED NOT DETECTED Final   Norovirus GI/GII NOT DETECTED NOT DETECTED Final   Rotavirus A NOT DETECTED NOT DETECTED Final   Sapovirus (I, II, IV, and V) NOT DETECTED NOT DETECTED Final    Comment: Performed at [facility], [address], [place], [place] [address]     Time coordinating discharge: 44mins  SIGNED:   Kathie Dike, MD  [facility] 02/09/2020, 10:38 PM   If 7PM-7AM, please contact night-coverage [email or web address]

## 2020-04-02 ENCOUNTER — Other Ambulatory Visit: Payer: Self-pay

## 2020-04-02 ENCOUNTER — Ambulatory Visit (INDEPENDENT_AMBULATORY_CARE_PROVIDER_SITE_OTHER): Payer: 59 | Admitting: Gastroenterology

## 2020-04-02 ENCOUNTER — Encounter: Payer: Self-pay | Admitting: Gastroenterology

## 2020-04-02 VITALS — BP 140/80 | HR 90 | Temp 97.1°F | Ht 64.0 in | Wt 220.0 lb

## 2020-04-02 DIAGNOSIS — R197 Diarrhea, unspecified: Secondary | ICD-10-CM

## 2020-04-02 NOTE — Progress Notes (Signed)
Primary Care Physician:  Elayne Guerin, [facility]  Primary Gastroenterologist:  Garfield Cornea, MD(initially assigned when seen in office in 2018, seen by Dr. Abbey Chatters during recent hospitalization but patient wants to continue to see Dr. Gala Romney as he takes care of her husband)   Chief Complaint  Patient presents with  . Diarrhea    severe episodes that can last 3-5 days  . Abdominal Pain    comes/goes, lower abd    HPI:  Kerry [name] is a 64 y.o. female with past medical history significant for left-sided stroke with residual right-sided deficits, prior PE/DVT on xarelto, CAD, hypertension, type 2 diabetes, tracheostomy here for hospital follow-up of abdominal pain and diarrhea.   She was in the hospital back in August for altered mentation.  She collapsed at home and became quite confused.  Noted to be diaphoretic.  EMS activated.  She was noted to have severe sepsis secondary to Klebsiella and Proteus UTI with associated bacteremia.  Noted to have significant diarrhea.  CT showed mild prominence of the sigmoid colon.  C. difficile was negative.  GI pathogen panel positive for enteropathogenic E. coli, treated with azithromycin.  Celiac panel unremarkable. Patient requires cystoscopy with right stent placement on the 16th for right-sided nephrolithiasis and mild hydronephrosis.  Patient spouse provides majority of history. Patient unable to provide extensive details but does answer some questions with simple answers or thumbs up. Patient has had diarrhea for years, even predating her stroke in 2016. May go several days without a BM. Then when the BMs start will have several days with loose stools. Stools mucousy. No formed BM. No melena, brbpr. Unable to walk so has BM in depends. Had blood in stool on stool kit, performed by PCP. Has a good appetite. Eats well. No nausea or vomiting. Sometimes chokeds/strangled when eating or drinking. States they have never  been advised to use thickening for liquids. Denies history of aspiration pneumonia outside of episode while hospitalized in 2016/2017 with acute stroke. Has tried using loperamide for diarrhea without relief.  Patient apprehensive about having a colonoscopy. She realizes the bowel prep would be difficult especially on her husband who will have to provide care for her. She has never had a colonoscopy.  Look at speech therapy.  Chest x-ray 02/02/2020: Tracheostomy device present. No acute findings.  Abdominal 1 view 02/05/2020: Gaseous distended loops of small bowel throughout the abdomen. Colonic distention. CT advised.  CT abdomen pelvis with contrast 02/05/2020: Redundant sigmoid colon. Mild prominence of the sigmoid colon measuring 6.2 cm in the mid abdomen right of midline. Right-sided nephrolithiasis. 2 adjacent stones over the distal right ureter at and just above the UPJ. Low-grade obstruction with mild right-sided hydronephrosis. Stable 7.1 cm simple appearing left ovarian cyst recommend pelvic ultrasound. Possible minimal layering sludge versus noncalcified gallstones. Mild cardiomegaly with small pericardial effusion, small bilateral pleural effusions, possible 1.3 cm right adrenal nodule likely adenoma, recommend adrenal CT 1 year.  New onset anemia and thrombocytopenia during recent hospitalization with sepsis.   06/2015 MBSS done during hospitalization for CVA complicated by aspiration pna: dysphagia 3 solids with nectar thick liquid. She has not been using thickening agents in several years.    Current Outpatient Medications  Medication Sig Dispense Refill  . amLODipine (NORVASC) 10 MG tablet Take 1 tablet (10 mg total) by mouth daily. 30 tablet 0  . atorvastatin (LIPITOR) 40 MG tablet Take 1 tablet (40 mg total) by mouth daily at 6 PM.  30 tablet 0  . baclofen (LIORESAL) 10 MG tablet Take 10 mg by mouth 2 (two) times daily.    . carvedilol (COREG) 12.5 MG tablet Take 1 tablet (12.5 mg  total) by mouth 2 (two) times daily with a meal. 60 tablet 0  . Cholecalciferol ([employer] D3) 5000 units capsule Take 5,000 Units by mouth daily.    . citalopram (CELEXA) 20 MG tablet Take 20 mg by mouth daily.  3  . fluticasone (FLONASE) 50 MCG/ACT nasal spray Place 2 sprays into both nostrils daily. (Patient taking differently: Place 2 sprays into both nostrils daily as needed for allergies. ) 16 g 6  . insulin glargine (LANTUS) 100 UNIT/ML Solostar Pen Inject 10 Units into the skin daily at 10 pm. 15 mL 1  . Insulin Pen Needle 32G X 4 MM MISC Use with insulin pen to dispense insulin daily 100 each 0  . lisinopril (PRINIVIL,ZESTRIL) 10 MG tablet Take 1 tablet (10 mg total) by mouth daily. 30 tablet 0  . loperamide (IMODIUM) 2 MG capsule Take 1 capsule (2 mg total) by mouth every 6 (six) hours as needed for diarrhea or loose stools. 30 capsule 0  . rivaroxaban (XARELTO) 20 MG TABS tablet Take 1 tablet (20 mg total) by mouth daily with supper. 30 tablet 0  . traMADol (ULTRAM) 50 MG tablet Take 50 mg by mouth every 6 (six) hours as needed for moderate pain.      No current facility-administered medications for this visit.    Allergies as of 04/02/2020 - Review Complete 04/02/2020  Allergen Reaction Noted  . Codeine Nausea Only 05/16/2015  . Oxycodone Nausea Only 09/13/2011  . Oxycontin [oxycodone hcl] Nausea Only 11/08/2012    Past Medical History:  Diagnosis Date  . Breast cancer ([place]) 03/10/2007   Left breat  . Carpal tunnel syndrome on right   . Coronary artery disease   . Degenerative disc disease, cervical   . Degenerative disc disease, lumbar   . Diabetes mellitus   . Elevated troponin   . Fibromyalgia   . GIB (gastrointestinal bleeding)   . HTN (hypertension) 06/13/2015  . Hyperlipidemia   . Hypertension   . PE (pulmonary embolism)   . 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  . Stroke [facility]) 2009   paralyzed on right side  . UTI  (lower urinary tract infection)     Past Surgical History:  Procedure Laterality Date  . APPENDECTOMY    . BREAST SURGERY    . CARDIAC CATHETERIZATION  2005  . CARDIAC CATHETERIZATION N/A 04/13/2015   Procedure: Temporary Pacemaker;  Surgeon: Peter M Martinique, MD;  Location: [place] [facility];  Service: Cardiovascular;  Laterality: N/A;  . CARDIAC SURGERY    . CYSTOSCOPY W/ URETERAL STENT PLACEMENT Right 02/06/2020   Procedure: CYSTOSCOPY WITH RETROGRADE PYELOGRAM/URETERAL STENT PLACEMENT;  Surgeon: Robley Fries, MD;  Location: [facility];  Service: Urology;  Laterality: Right;  . ESOPHAGOGASTRODUODENOSCOPY N/A 04/18/2015   Procedure: ESOPHAGOGASTRODUODENOSCOPY (EGD);  Surgeon: Judeth Horn, MD;  Location: [place];  Service: General;  Laterality: N/A;  . IR GASTROSTOMY TUBE REMOVAL  11/05/2016  . MASTECTOMY Left 03/2007  . PEG PLACEMENT N/A 04/18/2015   Procedure: PERCUTANEOUS ENDOSCOPIC GASTROSTOMY (PEG) PLACEMENT;  Surgeon: Judeth Horn, MD;  Location: [place];  Service: General;  Laterality: N/A;  bedside  . RADIOLOGY WITH ANESTHESIA N/A 04/11/2015   Procedure: RADIOLOGY WITH ANESTHESIA;  Surgeon: Luanne Bras, MD;  Location: [place];  Service: Radiology;  Laterality: N/A;  . TUBAL LIGATION Right   . VEIN SURGERY Right 2005    Family History  Problem Relation Age of Onset  . Cancer Mother        Breast cancer (right)  . Cancer Maternal Aunt        Breast cancer  . Cancer Maternal Aunt        Breast cancer  . Cancer Cousin        Breast Cancer  . Diabetes Sister   . Colon cancer Neg Hx     Social History   Socioeconomic History  . Marital status: Married    Spouse name: Not on file  . Number of children: Not on file  . Years of education: Not on file  . Highest education level: Not on file  Occupational History  . Not on file  Tobacco Use  . Smoking status: Former Smoker    Packs/day: 0.25    Quit date: 08/14/2000    Years since quitting: 19.6  .  Smokeless tobacco: Never Used  Vaping Use  . Vaping Use: Never used  Substance and Sexual Activity  . Alcohol use: Yes    Comment: Social drinker  . Drug use: No    Comment: Tried crack cocaine and marijuana in the past  . Sexual activity: Never    Birth control/protection: None  Other Topics Concern  . Not on file  Social History Narrative  . Not on file   Social Determinants of Health   Financial Resource Strain:   . Difficulty of Paying Living Expenses: Not on file  Food Insecurity:   . Worried About Charity fundraiser in the Last Year: Not on file  . Ran Out of Food in the Last Year: Not on file  Transportation Needs:   . Lack of Transportation (Medical): Not on file  . Lack of Transportation (Non-Medical): Not on file  Physical Activity:   . Days of Exercise per Week: Not on file  . Minutes of Exercise per Session: Not on file  Stress:   . Feeling of Stress : Not on file  Social Connections:   . Frequency of Communication with Friends and Family: Not on file  . Frequency of Social Gatherings with Friends and Family: Not on file  . Attends Religious Services: Not on file  . Active Member of Clubs or Organizations: Not on file  . Attends Archivist Meetings: Not on file  . Marital Status: Not on file  Intimate Partner Violence:   . Fear of Current or Ex-Partner: Not on file  . Emotionally Abused: Not on file  . Physically Abused: Not on file  . Sexually Abused: Not on file      ROS:  General: Negative for anorexia, weight loss, fever, chills, fatigue, +weakness. Eyes: Negative for vision changes.  ENT: Negative for hoarseness,  nasal congestion. Post stroke food gets stuck at times in back of throat. CV: Negative for chest pain, angina, palpitations, dyspnea on exertion, peripheral edema.  Respiratory: Negative for dyspnea at rest, dyspnea on exertion,   wheezing. Chronic stridor/cough since cva GI: See history of present illness. GU:  Negative for  dysuria, hematuria, urinary incontinence,   nocturnal urination. +frequency MS: Negative for joint pain, low back pain. Does not ambulate since stroke Derm: Negative for rash or itching.  Neuro: Negative for  seizure, frequent headaches, memory loss, confusion. +right sided weakness Psych: Negative for anxiety, depression, suicidal  ideation, hallucinations.  Endo: Negative for unusual weight change.  Heme: Negative for bruising or bleeding. Allergy: Negative for rash or hives.    Physical Examination:  BP 140/80   Pulse 90   Temp (!) 97.1 F (36.2 C) (Oral)   Ht 5' 4" (1.626 m)   Wt 220 lb (99.8 kg)   BMI 37.76 kg/m    General: appears older than stated age. In wheelchair. Husband at side. Helps provide most of history. Patient with audible stridor. Wet cough. Head: Normocephalic, atraumatic.   Eyes: Conjunctiva pink, no icterus. Mouth: masked Neck: Supple without thyromegaly, masses, or lymphadenopathy.  Lungs: upper airway sounds making difficult to auscultate lungs.  Heart: Regular rate and rhythm, no murmurs rubs or gallops.  Abdomen: Bowel sounds are normal, nontender, nondistended, no hepatosplenomegaly or masses, no abdominal bruits or    hernia , no rebound or guarding.   Rectal: not performed Extremities: No lower extremity edema. No clubbing or deformities.  Neuro: Alert and oriented x 4 ,  Skin: Warm and dry, no rash or jaundice.   Psych: Alert and cooperative, normal mood and affect.  Labs: Lab Results  Component Value Date   CREATININE 0.56 02/09/2020   BUN 8 02/09/2020   NA 135 02/09/2020   K 3.6 02/09/2020   CL 95 (L) 02/09/2020   CO2 30 02/09/2020   Lab Results  Component Value Date   ALT 14 02/03/2020   AST 15 02/03/2020   ALKPHOS 78 02/03/2020   BILITOT 0.6 02/03/2020   Lab Results  Component Value Date   WBC 8.1 02/08/2020   HGB 10.6 (L) 02/08/2020   HCT 34.0 (L) 02/08/2020   MCV 85.2 02/08/2020   PLT 165 02/08/2020   Lab Results   Component Value Date   IRON 32 06/25/2015   TIBC 235 (L) 06/25/2015   FERRITIN 134 06/25/2015   No results found for: VITAMINB12 No results found for: FOLATE Lab Results  Component Value Date   INR 2.5 (H) 02/03/2020   INR 1.7 (H) 02/02/2020   INR 1.65 04/12/2016     Imaging Studies: No results found.  Impression/Plan:  Pleasant 64 year old female presenting for hospital follow-up as outlined above.  She has a history of chronic diarrhea for several years.  No prior colonoscopy.  Recent stool studies negative for C. difficile but noted to have enteropathogenic E. coli in addition to bacteremia (Klebsiella and Proteus).  Required multiple antibiotics.  Continues to have loose stools.  No worse than before she was in the hospital.  Can actually go days without a bowel movement but then have several days where she has several loose stools.  Denies solid stools.  Imodium does not seem to help.  Celiac screen was negative.  Imaging as outlined above with gaseous distention of the small bowel and colonic distention, prominent sigmoid colon but no evidence of obstruction or volvulus.  Discussed at length with patient and spouse today.  Colonoscopy is advised to further evaluate her symptoms and CT findings but patient and spouse reluctant because of her limited mobility and concerns for difficulty prepping.  Will discuss further with Dr. Gala Romney regarding possible consideration of flexible sigmoidoscopy at least that she is not able to complete bowel prep.  Will need to hold Xarelto for 48 hours as well.  Further recommendations to follow.  Left ovarian cyst: Stable 7.1 cm simple appearing left ovarian cyst recommend pelvic ultrasound.  We will have her follow-up with PCP.   Possible 1.3 cm right  adrenal nodule likely adenoma, recommend adrenal CT 1 year.,  We will have her follow-up with PCP.

## 2020-04-02 NOTE — Patient Instructions (Signed)
1. We will be in touch with further recommendations once I have discussed your case with Dr. Gala Romney.

## 2020-04-02 NOTE — Progress Notes (Signed)
Cc'ed to pcp °

## 2020-04-20 ENCOUNTER — Telehealth: Payer: Self-pay | Admitting: Gastroenterology

## 2020-04-20 NOTE — Telephone Encounter (Signed)
Please let pt know that Dr. Gala Romney advises colonoscopy to completely evaluate diarrhea and abnormal colon on CT. He does not recommend flex sig.  He did say that if she is agreeable, we could alter bowel prep somewhat to help her given that prep will be difficult on her. Would consider longer period of clear liquids 2 full days and use linzess and miralax.   If she is agreeable, would do the following: -Colonoscopy with (radnom colon bx) Dr. Gala Romney. With propofol. ASA 3. FYI patient has trach -Two full days of clear liquids.  -while on clear liquids, Lantus 5 units at 10pm -hold xarelto 48 hours -Linzess 15mcg daily for 3 days before miralax prep. -miralax prep

## 2020-04-23 NOTE — Telephone Encounter (Signed)
Spoke with pts spouse. He is ok with pt having TCS and is aware that pts prep will be altered. Routing to [facility].

## 2020-04-24 NOTE — Telephone Encounter (Signed)
Will call patient at later date to schedule once we receive Dr. Gala Romney schedule

## 2020-05-02 NOTE — Patient Instructions (Signed)
Kerry [name]  05/02/2020     @PREFPERIOPPHARMACY @   Your procedure is scheduled on  05/08/2020.  Report to [facility] at  0830  A.M.  Call this number if you have problems the morning of surgery:  [phone]   Remember:  Follow the diet and prep instructions given to you by the office.                        Take these medicines the morning of surgery with A SIP OF WATER  Amlodipine, baclofen,carvedilol, celexa, tramadol. Take 1/2of your Lantus (5) units the night before your procedure. DO NOT take any medications for diabetes the morning of your procedure. Your last dose of Xarelto should be 05/05/2020.    Do not wear jewelry, make-up or nail polish.  Do not wear lotions, powders, or perfumes. Please wear deodorant and brush y our teeth.  Do not shave 48 hours prior to surgery.  Men may shave face and neck.  Do not bring valuables to the hospital.  [facility] is not responsible for any belongings or valuables.  Contacts, dentures or bridgework may not be worn into surgery.  Leave your suitcase in the car.  After surgery it may be brought to your room.  For patients admitted to the hospital, discharge time will be determined by your treatment team.  Patients discharged the day of surgery will not be allowed to drive home.   Name and phone number of your driver:   family Special instructions:   DO NOT smoke the morning of your procedure.  Please read over the following fact sheets that you were given. Anesthesia Post-op Instructions and Care and Recovery After Surgery       Colonoscopy, Adult, Care After This sheet gives you information about how to care for yourself after your procedure. Your health care provider may also give you more specific instructions. If you have problems or questions, contact your health care provider. What can I expect after the procedure? After the procedure, it is common to have:  A small amount of blood in your stool for 24  hours after the procedure.  Some gas.  Mild cramping or bloating of your abdomen. Follow these instructions at home: Eating and drinking   Drink enough fluid to keep your urine pale yellow.  Follow instructions from your health care provider about eating or drinking restrictions.  Resume your normal diet as instructed by your health care provider. Avoid heavy or fried foods that are hard to digest. Activity  Rest as told by your health care provider.  Avoid sitting for a long time without moving. Get up to take short walks every 1-2 hours. This is important to improve blood flow and breathing. Ask for help if you feel weak or unsteady.  Return to your normal activities as told by your health care provider. Ask your health care provider what activities are safe for you. Managing cramping and bloating   Try walking around when you have cramps or feel bloated.  Apply heat to your abdomen as told by your health care provider. Use the heat source that your health care provider recommends, such as a moist heat pack or a heating pad. ? Place a towel between your skin and the heat source. ? Leave the heat on for 20-30 minutes. ? Remove the heat if your skin turns bright red. This is especially important if you are unable to  feel pain, heat, or cold. You may have a greater risk of getting burned. General instructions  For the first 24 hours after the procedure: ? Do not drive or use machinery. ? Do not sign important documents. ? Do not drink alcohol. ? Do your regular daily activities at a slower pace than normal. ? Eat soft foods that are easy to digest.  Take over-the-counter and prescription medicines only as told by your health care provider.  Keep all follow-up visits as told by your health care provider. This is important. Contact a health care provider if:  You have blood in your stool 2-3 days after the procedure. Get help right away if you have:  More than a small  spotting of blood in your stool.  Large blood clots in your stool.  Swelling of your abdomen.  Nausea or vomiting.  A fever.  Increasing pain in your abdomen that is not relieved with medicine. Summary  After the procedure, it is common to have a small amount of blood in your stool. You may also have mild cramping and bloating of your abdomen.  For the first 24 hours after the procedure, do not drive or use machinery, sign important documents, or drink alcohol.  Get help right away if you have a lot of blood in your stool, nausea or vomiting, a fever, or increased pain in your abdomen. This information is not intended to replace advice given to you by your health care provider. Make sure you discuss any questions you have with your health care provider. Document Revised: 01/03/2019 Document Reviewed: 01/03/2019 Elsevier Patient Education  [place] After These instructions provide you with information about caring for yourself after your procedure. Your health care provider may also give you more specific instructions. Your treatment has been planned according to current medical practices, but problems sometimes occur. Call your health care provider if you have any problems or questions after your procedure. What can I expect after the procedure? After your procedure, you may:  Feel sleepy for several hours.  Feel clumsy and have poor balance for several hours.  Feel forgetful about what happened after the procedure.  Have poor judgment for several hours.  Feel nauseous or vomit.  Have a sore throat if you had a breathing tube during the procedure. Follow these instructions at home: For at least 24 hours after the procedure:      Have a responsible adult stay with you. It is important to have someone help care for you until you are awake and alert.  Rest as needed.  Do not: ? Participate in activities in which you could fall or  become injured. ? Drive. ? Use heavy machinery. ? Drink alcohol. ? Take sleeping pills or medicines that cause drowsiness. ? Make important decisions or sign legal documents. ? Take care of children on your own. Eating and drinking  Follow the diet that is recommended by your health care provider.  If you vomit, drink water, juice, or soup when you can drink without vomiting.  Make sure you have little or no nausea before eating solid foods. General instructions  Take over-the-counter and prescription medicines only as told by your health care provider.  If you have sleep apnea, surgery and certain medicines can increase your risk for breathing problems. Follow instructions from your health care provider about wearing your sleep device: ? Anytime you are sleeping, including during daytime naps. ? While taking prescription pain medicines, sleeping medicines,  or medicines that make you drowsy.  If you smoke, do not smoke without supervision.  Keep all follow-up visits as told by your health care provider. This is important. Contact a health care provider if:  You keep feeling nauseous or you keep vomiting.  You feel light-headed.  You develop a rash.  You have a fever. Get help right away if:  You have trouble breathing. Summary  For several hours after your procedure, you may feel sleepy and have poor judgment.  Have a responsible adult stay with you for at least 24 hours or until you are awake and alert. This information is not intended to replace advice given to you by your health care provider. Make sure you discuss any questions you have with your health care provider. Document Revised: 09/07/2017 Document Reviewed: 09/30/2015 Elsevier Patient Education  [place].

## 2020-05-02 NOTE — Telephone Encounter (Signed)
Pre-op/covid test 05/04/20 at 12:30pm. Tried to call son, no answer, LMOVM to inform him of appt.

## 2020-05-02 NOTE — Telephone Encounter (Signed)
Called pt's son Rodman Key), TCS w/Prop w/Dr. Gala Romney ASA 3 scheduled for 05/08/20 at 10:00am. Son unable to come by office to pick up instructions. Son's wife wrote down instructions for Miralax prep and special instructions for prep given by LSL. Orders entered.

## 2020-05-03 NOTE — Telephone Encounter (Signed)
Son called office this morning, he is aware of pre-op/covid test appt.

## 2020-05-04 ENCOUNTER — Telehealth: Payer: Self-pay | Admitting: Nurse Practitioner

## 2020-05-04 ENCOUNTER — Encounter (HOSPITAL_COMMUNITY)
Admission: RE | Admit: 2020-05-04 | Discharge: 2020-05-04 | Disposition: A | Discharge status: Home / Self Care | Payer: 59 | Source: Ambulatory Visit | Attending: Internal Medicine | Admitting: Internal Medicine

## 2020-05-04 ENCOUNTER — Encounter (HOSPITAL_COMMUNITY): Payer: Self-pay

## 2020-05-04 ENCOUNTER — Other Ambulatory Visit (HOSPITAL_COMMUNITY)
Admission: RE | Admit: 2020-05-04 | Discharge: 2020-05-04 | Disposition: A | Discharge status: Home / Self Care | Payer: 59 | Source: Ambulatory Visit | Attending: Internal Medicine | Admitting: Internal Medicine

## 2020-05-04 ENCOUNTER — Other Ambulatory Visit: Payer: Self-pay

## 2020-05-04 DIAGNOSIS — Z01812 Encounter for preprocedural laboratory examination: Secondary | ICD-10-CM | POA: Diagnosis present

## 2020-05-04 DIAGNOSIS — Z20822 Contact with and (suspected) exposure to covid-19: Secondary | ICD-10-CM | POA: Diagnosis not present

## 2020-05-04 DIAGNOSIS — R197 Diarrhea, unspecified: Secondary | ICD-10-CM

## 2020-05-04 LAB — SARS CORONAVIRUS 2 (TAT 6-24 HRS): SARS Coronavirus 2: NEGATIVE

## 2020-05-04 NOTE — Telephone Encounter (Signed)
Received a call from [facility] in short stay. Per Dr. Consuella Lose (Anesthesiologist) patient trach doesn't have a cuff and if general anesthesia were to be needed, he would not be able to give it. This is why last procedure was in [place]. Procedure will need to be cancelled.  Pam will notify patient.  Please reach out and confirm with patient on Monday 05/07/20 and work on referral for TCS in [place].  Cc: Neil Crouch, PA who saw the patient in the office.

## 2020-05-04 NOTE — Progress Notes (Signed)
Ms Kipp's trach tube does not have a balloon per Dr Burtis Junes.  Patient was transferred to [facility] for surgery 02/06/2020 for that reason.  Dr Burtis Junes stated she needed a tube with a cuff if he needed to change to general anesthesia.  Walden Field notified and will set up colonoscopy next week in [place].   Shannelle Alguire, her son was notified of the cancellation for 05/08/2020

## 2020-05-07 NOTE — Addendum Note (Signed)
Addended by: Cheron Every on: 05/07/2020 08:10 AM   Modules accepted: Orders

## 2020-05-07 NOTE — Telephone Encounter (Signed)
Referral placed.  Called patient and spoke with son. He is aware procedure was cancelled for tomorrow.

## 2020-05-08 ENCOUNTER — Encounter (HOSPITAL_COMMUNITY): Admission: RE | Payer: Self-pay | Source: Home / Self Care

## 2020-05-08 ENCOUNTER — Ambulatory Visit (HOSPITAL_COMMUNITY): Admission: RE | Admit: 2020-05-08 | Payer: 59 | Source: Home / Self Care | Admitting: Internal Medicine

## 2020-05-08 SURGERY — COLONOSCOPY WITH PROPOFOL
Anesthesia: Monitor Anesthesia Care

## 2020-05-14 ENCOUNTER — Telehealth: Payer: Self-pay | Admitting: Internal Medicine

## 2020-05-14 NOTE — Telephone Encounter (Signed)
Patient son called asking when the patient  procedure was going to be rescheduled

## 2020-05-14 NOTE — Telephone Encounter (Signed)
Called son and made aware referral was sent to [facility]. He voiced understanding.

## 2020-05-14 NOTE — Telephone Encounter (Signed)
See prior note. I have already spoken with him

## 2020-05-14 NOTE — Telephone Encounter (Signed)
Noted.   Please also let husband know the CT done while inpatient 01/2020 had couple of findings that still need to be followed up on and I would recommend her see PCP. See below.  Left ovarian cyst: Stable 7.1 cm simple appearing left ovarian cyst recommend pelvic ultrasound.  We will have her follow-up with PCP.   Possible 1.3 cm right adrenal nodule likely adenoma, recommend adrenal CT 1 year.,  We will have her follow-up with PCP.

## 2020-05-14 NOTE — Telephone Encounter (Signed)
Spoke with pts son. He was notified that pt needs to be followed by her PCP for the adrenal CT scan in 1 year and lt ovarian cyst.   Pts son also said he called a few mins ago to check to see if pt was r/s for her TCS. Please advise.

## 2020-06-13 ENCOUNTER — Telehealth: Payer: Self-pay | Admitting: Gastroenterology

## 2020-06-13 NOTE — Telephone Encounter (Signed)
Hey Dr Loletha Carrow, this pt is being referred to [place] from [facility] for diarrhea and colonoscopy, pt has last seen [facility] on 04/02/2020 and has not had a previous colonoscopy performed, records are in epic for review, please advise on scheduling.

## 2020-06-13 NOTE — Telephone Encounter (Signed)
Thank you for assisting [place] with this patient.

## 2020-06-13 NOTE — Telephone Encounter (Signed)
(  Chart review indicates medically complex patient with tracheostomy on [place], seen by [facility] for chronic diarrhea and was planned for colonoscopy but [facility] anesthesiology canceled due to concerns for airway management)   GI will see the patient.  She can be scheduled for next available new patient visit with any MD or APP. We can then evaluate patient, make plans for hospital-based colonoscopy and return care to [place] afterwards.  - H. Loletha Carrow, MD

## 2020-08-10 ENCOUNTER — Other Ambulatory Visit: Payer: Self-pay | Admitting: Urology

## 2020-08-20 NOTE — Patient Instructions (Addendum)
DUE TO COVID-19 ONLY ONE VISITOR IS ALLOWED TO COME WITH YOU AND STAY IN THE WAITING ROOM ONLY DURING PRE OP AND PROCEDURE.          Your procedure is scheduled on:  Tuesday, 08-28-20   Report to [facility] Main  Entrance   Report to admitting at 8:30 AM   Call this number if you have problems the morning of surgery [phone]   Do not eat food :After Midnight.   May have liquids until 8:00 AM day of surgery  CLEAR LIQUID DIET  Foods Allowed                                                                     Foods Excluded  Water, Black Coffee and tea, regular and decaf               liquids that you cannot  Plain Jell-O in any flavor  (No red)                                      see through such as: Fruit ices (not with fruit pulp)                                      milk, soups, orange juice              Iced Popsicles (No red)                                      All solid food                                   Apple juices Sports drinks like Gatorade (No red) Lightly seasoned clear broth or consume(fat free) Sugar, honey syrup     Oral Hygiene is also important to reduce your risk of infection.                                    Remember - BRUSH YOUR TEETH THE MORNING OF SURGERY WITH YOUR REGULAR TOOTHPASTE   Do NOT smoke after Midnight   Take these medicines the morning of surgery with A SIP OF WATER:  Amlodipine, Carvedilol, Citalpram, Pravastatin  How to Manage Your Diabetes Before and After Surgery  Why is it important to control my blood sugar before and after surgery? . Improving blood sugar levels before and after surgery helps healing and can limit problems. . A way of improving blood sugar control is eating a healthy diet by: o  Eating less sugar and carbohydrates o  Increasing activity/exercise o  Talking with your doctor about reaching your blood sugar goals . High blood sugars (greater than 180 mg/dL) can raise your risk of infections and  slow your recovery, so you will need to focus on controlling your diabetes during the weeks before  surgery. . Make sure that the doctor who takes care of your diabetes knows about your planned surgery including the date and location.  How do I manage my blood sugar before surgery? . Check your blood sugar at least 4 times a day, starting 2 days before surgery, to make sure that the level is not too high or low. o Check your blood sugar the morning of your surgery when you wake up and every 2 hours until you get to the Short Stay unit. . If your blood sugar is less than 70 mg/dL, you will need to treat for low blood sugar: o Do not take insulin. o Treat a low blood sugar (less than 70 mg/dL) with  cup of clear juice (cranberry or apple), 4 glucose tablets, OR glucose gel. o Recheck blood sugar in 15 minutes after treatment (to make sure it is greater than 70 mg/dL). If your blood sugar is not greater than 70 mg/dL on recheck, call [phone] for further instructions. . Report your blood sugar to the short stay nurse when you get to Short Stay.  . If you are admitted to the hospital after surgery: o Your blood sugar will be checked by the staff and you will probably be given insulin after surgery (instead of oral diabetes medicines) to make sure you have good blood sugar levels. o The goal for blood sugar control after surgery is 80-180 mg/dL.   WHAT DO I DO ABOUT MY DIABETES MEDICATION?  Marland Kitchen Do not take oral diabetes medicines (pills) the morning of surgery.  . THE NIGHT BEFORE SURGERY:  Take 5 units of Insulin Glargine.       . THE MORNING OF SURGERY: Do not take Insulin Glargine.   Reviewed and Endorsed by [facility] Patient Education Committee, August 2015                               You may not have any metal on your body including hair pins, jewelry, and body piercings             Do not wear make-up, lotions, powders, perfumes/cologne, or deodorant             Do not wear nail  polish.  Do not shave  48 hours prior to surgery.                Do not bring valuables to the hospital. [place].   Contacts, dentures or bridgework may not be worn into surgery.      Patients discharged the day of surgery will not be allowed to drive home.               Please read over the following fact sheets you were given: IF YOU HAVE QUESTIONS ABOUT YOUR PRE OP INSTRUCTIONS PLEASE CALL  [place] - Preparing for Surgery Before surgery, you can play an important role.  Because skin is not sterile, your skin needs to be as free of germs as possible.  You can reduce the number of germs on your skin by washing with CHG (chlorahexidine gluconate) soap before surgery.  CHG is an antiseptic cleaner which kills germs and bonds with the skin to continue killing germs even after washing. Please DO NOT use if you have an allergy to CHG or  antibacterial soaps.  If your skin becomes reddened/irritated stop using the CHG and inform your nurse when you arrive at Short Stay. Do not shave (including legs and underarms) for at least 48 hours prior to the first CHG shower.  You may shave your face/neck.  Please follow these instructions carefully:  1.  Shower with CHG Soap the night before surgery and the  morning of surgery.  2.  If you choose to wash your hair, wash your hair first as usual with your normal  shampoo.  3.  After you shampoo, rinse your hair and body thoroughly to remove the shampoo.                             4.  Use CHG as you would any other liquid soap.  You can apply chg directly to the skin and wash.  Gently with a scrungie or clean washcloth.  5.  Apply the CHG Soap to your body ONLY FROM THE NECK DOWN.   Do   not use on face/ open                           Wound or open sores. Avoid contact with eyes, ears mouth and   genitals (private parts).                       Wash face,  Genitals (private parts)  with your normal soap.             6.  Wash thoroughly, paying special attention to the area where your    surgery  will be performed.  7.  Thoroughly rinse your body with warm water from the neck down.  8.  DO NOT shower/wash with your normal soap after using and rinsing off the CHG Soap.                9.  Pat yourself dry with a clean towel.            10.  Wear clean pajamas.            11.  Place clean sheets on your bed the night of your first shower and do not  sleep with pets. Day of Surgery : Do not apply any lotions/deodorants the morning of surgery.  Please wear clean clothes to the hospital/surgery center.  FAILURE TO FOLLOW THESE INSTRUCTIONS MAY RESULT IN THE CANCELLATION OF YOUR SURGERY  PATIENT SIGNATURE_________________________________  NURSE SIGNATURE__________________________________  ________________________________________________________________________

## 2020-08-20 NOTE — Progress Notes (Addendum)
COVID Vaccine Completed:  No Date COVID Vaccine completed: Has received booster: COVID vaccine manufacturer: [employer]   Date of COVID positive in last 90 days:  N/A  PCP - Bradly Bienenstock, MD Cardiologist - N/A  Medical clearance on chart dated 08-10-20 by Bradly Bienenstock, NP  Chest x-ray - 02-02-20 Epic EKG - 02-02-20 Epic Stress Test -  ECHO - 2016 Epic Cardiac Cath - 2016 Epic Pacemaker/ICD device last checked: Temporary in 2016.  No longer has   Sleep Study - N/A CPAP -   Fasting Blood Sugar - 150 to 200 Checks Blood Sugar x1  time a day  Blood Thinner Instructions:  Xarelto 20 mg. Patient to stay on per Dr. 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 Patient was also instructed that they will need to review over the PAT instructions again at home before surgery.

## 2020-08-23 ENCOUNTER — Encounter (HOSPITAL_COMMUNITY): Payer: Self-pay

## 2020-08-23 ENCOUNTER — Other Ambulatory Visit: Payer: Self-pay

## 2020-08-23 ENCOUNTER — Encounter (HOSPITAL_COMMUNITY)
Admission: RE | Admit: 2020-08-23 | Discharge: 2020-08-23 | Disposition: A | Discharge status: Home / Self Care | Payer: 59 | Source: Ambulatory Visit | Attending: Urology | Admitting: Urology

## 2020-08-23 DIAGNOSIS — Z01812 Encounter for preprocedural laboratory examination: Secondary | ICD-10-CM | POA: Insufficient documentation

## 2020-08-23 LAB — BASIC METABOLIC PANEL
Anion gap: 8 (ref 5–15)
BUN: 11 mg/dL (ref 8–23)
CO2: 30 mmol/L (ref 22–32)
Calcium: 8.7 mg/dL — ABNORMAL LOW (ref 8.9–10.3)
Chloride: 106 mmol/L (ref 98–111)
Creatinine, Ser: 0.58 mg/dL (ref 0.44–1.00)
GFR, Estimated: >60 mL/min (ref >60)
Glucose, Bld: 158 mg/dL — ABNORMAL HIGH (ref 70–99)
Potassium: 3.1 mmol/L — ABNORMAL LOW (ref 3.5–5.1)
Sodium: 144 mmol/L (ref 135–145)

## 2020-08-23 LAB — GLUCOSE, CAPILLARY: Glucose-Capillary: 141 mg/dL — ABNORMAL HIGH (ref 70–99)

## 2020-08-23 LAB — CBC
HCT: 37.6 % (ref 36.0–46.0)
Hemoglobin: 11.6 g/dL — ABNORMAL LOW (ref 12.0–15.0)
MCH: 25.6 pg — ABNORMAL LOW (ref 26.0–34.0)
MCHC: 30.9 g/dL (ref 30.0–36.0)
MCV: 83 fL (ref 80.0–100.0)
Platelets: 368 10*3/uL (ref 150–400)
RBC: 4.53 MIL/uL (ref 3.87–5.11)
RDW: 14.7 % (ref 11.5–15.5)
WBC: 12.1 10*3/uL — ABNORMAL HIGH (ref 4.0–10.5)
nRBC: 0 % (ref 0.0–0.2)

## 2020-08-24 NOTE — Progress Notes (Signed)
Anesthesia Chart Review   Case: [id number] Date/Time: 08/28/20 1115   Procedures:      CYSTOSCOPY WITH RETROGRADE PYELOGRAM, URETEROSCOPY AND STENT PLACEMENT (Right ) - 2 HRS     HOLMIUM LASER APPLICATION (Right )   Anesthesia type: Choice   Pre-op diagnosis: RIGHT URETERAL STENT-RETAINED, RIGHT NEPHROLITHIASIS   Location: [facility] ROOM 05 / [facility]   Surgeons: Robley Fries, MD      DISCUSSION:65 y.o. former smoker with h/o HTN, DM II, CVA with right hemiparesis, aphasia, tracheostomy in place, right ureteral retained stent, right nephrolithiasis scheduled for above procedure 08/28/2020 with Dr. Jacalyn Lefevre.   S/p cystoscopy 02/06/2020 with no anesthesia complications noted.  Per note tracheostomy is uncuffed.    Clearance from PCP received which states, "Cassondra Stachowski was seen in my office on 08/10/2020. She is cleared for Surgery from a Medical Standpoint. Labs are available for viewing in Epic."  VS: BP (!) 83/71   Pulse 68   Temp 36.7 C (Oral)   Resp 18   SpO2 99%   PROVIDERS: White, Delmar Landau, NP is PCP    LABS: Labs reviewed: Acceptable for surgery. (all labs ordered are listed, but only abnormal results are displayed)  Labs Reviewed  BASIC METABOLIC PANEL - Abnormal; Notable for the following components:      Result Value   Potassium 3.1 (*)    Glucose, Bld 158 (*)    Calcium 8.7 (*)    All other components within normal limits  CBC - Abnormal; Notable for the following components:   WBC 12.1 (*)    Hemoglobin 11.6 (*)    MCH 25.6 (*)    All other components within normal limits  GLUCOSE, CAPILLARY - Abnormal; Notable for the following components:   Glucose-Capillary 141 (*)    All other components within normal limits     IMAGES:   EKG: 02/02/2020 Rate 92 bpm  NSR Nonspecific ST and T abnormality Prolonged QT  CV: Echo 06/07/2015 Study Conclusions   - Left ventricle: The cavity size was normal. Wall thickness was  increased in a pattern of mild  LVH. Systolic function was normal.  The estimated ejection fraction was in the range of 55% to 60%.  Wall motion was normal; there were no regional wall motion  abnormalities. Doppler parameters are consistent with abnormal  left ventricular relaxation (grade 1 diastolic dysfunction).  Past Medical History:  Diagnosis Date  . Breast cancer ([place]) 03/10/2007   Left breat  . Carpal tunnel syndrome on right   . Coronary artery disease   . Degenerative disc disease, cervical   . Degenerative disc disease, lumbar   . Diabetes mellitus   . Elevated troponin   . Fibromyalgia   . GIB (gastrointestinal bleeding)   . HTN (hypertension) 06/13/2015  . Hyperlipidemia   . Hypertension   . PE (pulmonary embolism)   . 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  . Stroke ([place])    paralyzed on right side  . UTI (lower urinary tract infection)     Past Surgical History:  Procedure Laterality Date  . APPENDECTOMY    . BREAST SURGERY    . CARDIAC CATHETERIZATION  2005  . CARDIAC CATHETERIZATION N/A 04/13/2015   Procedure: Temporary Pacemaker;  Surgeon: Peter M Martinique, MD;  Location: [facility];  Service: Cardiovascular;  Laterality: N/A;  . CARDIAC SURGERY    . CYSTOSCOPY W/ URETERAL STENT PLACEMENT Right  02/06/2020   Procedure: CYSTOSCOPY WITH RETROGRADE PYELOGRAM/URETERAL STENT PLACEMENT;  Surgeon: Robley Fries, MD;  Location[facility];  Service: Urology;  Laterality: Right;  . ESOPHAGOGASTRODUODENOSCOPY N/A 04/18/2015   Procedure: ESOPHAGOGASTRODUODENOSCOPY (EGD);  Surgeon: Judeth Horn, MD;  Location: [facility];  Service: General;  Laterality: N/A;  . IR GASTROSTOMY TUBE REMOVAL  11/05/2016  . MASTECTOMY Left 03/2007  . PEG PLACEMENT N/A 04/18/2015   Procedure: PERCUTANEOUS ENDOSCOPIC GASTROSTOMY (PEG) PLACEMENT;  Surgeon: Judeth Horn, MD;  Location: [facility];  Service: General;  Laterality: N/A;  bedside  . RADIOLOGY WITH  ANESTHESIA N/A 04/11/2015   Procedure: RADIOLOGY WITH ANESTHESIA;  Surgeon: Luanne Bras, MD;  Location: [facility];  Service: Radiology;  Laterality: N/A;  . TRACHEOSTOMY    . TUBAL LIGATION Right   . VEIN SURGERY Right 2005    MEDICATIONS: . amLODipine (NORVASC) 10 MG tablet  . atorvastatin (LIPITOR) 40 MG tablet  . baclofen (LIORESAL) 10 MG tablet  . carvedilol (COREG) 12.5 MG tablet  . Cholecalciferol ([employer] D3) 5000 units capsule  . citalopram (CELEXA) 40 MG tablet  . fluticasone (FLONASE) 50 MCG/ACT nasal spray  . insulin glargine (LANTUS) 100 UNIT/ML Solostar Pen  . Insulin Pen Needle 32G X 4 MM MISC  . lisinopril (PRINIVIL,ZESTRIL) 10 MG tablet  . loperamide (IMODIUM) 2 MG capsule  . ondansetron (ZOFRAN) 4 MG tablet  . pravastatin (PRAVACHOL) 40 MG tablet  . rivaroxaban (XARELTO) 20 MG TABS tablet  . traMADol (ULTRAM) 50 MG tablet   No current facility-administered medications for this encounter.    Konrad Felix, PA-C [facility]Surgical Testing [phone]

## 2020-08-28 ENCOUNTER — Ambulatory Visit (HOSPITAL_COMMUNITY): Payer: 59 | Admitting: Certified Registered"

## 2020-08-28 ENCOUNTER — Ambulatory Visit (HOSPITAL_COMMUNITY)
Admission: RE | Admit: 2020-08-28 | Discharge: 2020-08-28 | Disposition: A | Discharge status: Home / Self Care | Payer: 59 | Attending: Urology | Admitting: Urology

## 2020-08-28 ENCOUNTER — Ambulatory Visit (HOSPITAL_COMMUNITY): Payer: 59 | Admitting: Physician Assistant

## 2020-08-28 ENCOUNTER — Encounter (HOSPITAL_COMMUNITY)
Admission: RE | Disposition: A | Discharge status: Home / Self Care | Payer: Self-pay | Source: Home / Self Care | Attending: Urology

## 2020-08-28 ENCOUNTER — Ambulatory Visit (HOSPITAL_COMMUNITY): Payer: 59

## 2020-08-28 ENCOUNTER — Encounter (HOSPITAL_COMMUNITY): Payer: Self-pay | Admitting: Urology

## 2020-08-28 DIAGNOSIS — Z20822 Contact with and (suspected) exposure to covid-19: Secondary | ICD-10-CM | POA: Diagnosis not present

## 2020-08-28 DIAGNOSIS — I2510 Atherosclerotic heart disease of native coronary artery without angina pectoris: Secondary | ICD-10-CM | POA: Insufficient documentation

## 2020-08-28 DIAGNOSIS — X58XXXA Exposure to other specified factors, initial encounter: Secondary | ICD-10-CM | POA: Diagnosis not present

## 2020-08-28 DIAGNOSIS — I10 Essential (primary) hypertension: Secondary | ICD-10-CM | POA: Diagnosis not present

## 2020-08-28 DIAGNOSIS — Z419 Encounter for procedure for purposes other than remedying health state, unspecified: Secondary | ICD-10-CM

## 2020-08-28 DIAGNOSIS — T83192A Other mechanical complication of urinary stent, initial encounter: Secondary | ICD-10-CM | POA: Insufficient documentation

## 2020-08-28 DIAGNOSIS — Z8673 Personal history of transient ischemic attack (TIA), and cerebral infarction without residual deficits: Secondary | ICD-10-CM | POA: Insufficient documentation

## 2020-08-28 DIAGNOSIS — N136 Pyonephrosis: Secondary | ICD-10-CM | POA: Diagnosis not present

## 2020-08-28 DIAGNOSIS — Z86711 Personal history of pulmonary embolism: Secondary | ICD-10-CM | POA: Insufficient documentation

## 2020-08-28 DIAGNOSIS — E119 Type 2 diabetes mellitus without complications: Secondary | ICD-10-CM | POA: Diagnosis not present

## 2020-08-28 DIAGNOSIS — Z885 Allergy status to narcotic agent status: Secondary | ICD-10-CM | POA: Diagnosis not present

## 2020-08-28 HISTORY — PX: CYSTOSCOPY WITH RETROGRADE PYELOGRAM, URETEROSCOPY AND STENT PLACEMENT: SHX5789

## 2020-08-28 HISTORY — PX: HOLMIUM LASER APPLICATION: SHX5852

## 2020-08-28 LAB — GLUCOSE, CAPILLARY
Glucose-Capillary: 152 mg/dL — ABNORMAL HIGH (ref 70–99)
Glucose-Capillary: 159 mg/dL — ABNORMAL HIGH (ref 70–99)

## 2020-08-28 LAB — URINALYSIS, ROUTINE W REFLEX MICROSCOPIC
Bilirubin Urine: NEGATIVE
Glucose, UA: NEGATIVE mg/dL
Ketones, ur: 5 mg/dL — AB
Nitrite: NEGATIVE
Protein, ur: 100 mg/dL — AB
RBC / HPF: >50 RBC/hpf — ABNORMAL HIGH (ref 0–5)
Specific Gravity, Urine: 1.012 (ref 1.005–1.030)
WBC, UA: >50 WBC/hpf — ABNORMAL HIGH (ref 0–5)
pH: 7 (ref 5.0–8.0)

## 2020-08-28 LAB — SARS CORONAVIRUS 2 BY RT PCR (HOSPITAL ORDER, PERFORMED IN ~~LOC~~ HOSPITAL LAB): SARS Coronavirus 2: NEGATIVE

## 2020-08-28 SURGERY — CYSTOURETEROSCOPY, WITH RETROGRADE PYELOGRAM AND STENT INSERTION
Anesthesia: General | Laterality: Right

## 2020-08-28 MED ORDER — CHLORHEXIDINE GLUCONATE 0.12 % MT SOLN
15.0000 mL | Freq: Once | OROMUCOSAL | Status: AC
  Administered 2020-08-28: 15 mL via OROMUCOSAL

## 2020-08-28 MED ORDER — CIPROFLOXACIN IN D5W 400 MG/200ML IV SOLN
400.0000 mg | INTRAVENOUS | Status: AC
  Administered 2020-08-28: 400 mg via INTRAVENOUS
  Filled 2020-08-28: qty 200

## 2020-08-28 MED ORDER — GLYCOPYRROLATE 0.2 MG/ML IJ SOLN
INTRAMUSCULAR | Status: DC | PRN
  Administered 2020-08-28: .2 mg via INTRAVENOUS

## 2020-08-28 MED ORDER — SUGAMMADEX SODIUM 200 MG/2ML IV SOLN
INTRAVENOUS | Status: DC | PRN
  Administered 2020-08-28 (×2): 100 mg via INTRAVENOUS

## 2020-08-28 MED ORDER — SODIUM CHLORIDE 0.9 % IR SOLN
Status: DC | PRN
  Administered 2020-08-28: 9000 mL

## 2020-08-28 MED ORDER — LACTATED RINGERS IV SOLN: INTRAVENOUS | Status: DC

## 2020-08-28 MED ORDER — ONDANSETRON HCL 4 MG/2ML IJ SOLN: 4.0000 mg | Freq: Once | INTRAMUSCULAR | Status: DC | PRN

## 2020-08-28 MED ORDER — PROPOFOL 10 MG/ML IV BOLUS
INTRAVENOUS | Status: DC | PRN
  Administered 2020-08-28 (×4): 10 mg via INTRAVENOUS
  Administered 2020-08-28 (×2): 20 mg via INTRAVENOUS
  Administered 2020-08-28: 50 mg via INTRAVENOUS

## 2020-08-28 MED ORDER — IOHEXOL 300 MG/ML  SOLN
INTRAMUSCULAR | Status: DC | PRN
  Administered 2020-08-28: 10 mL

## 2020-08-28 MED ORDER — ORAL CARE MOUTH RINSE: 15.0000 mL | Freq: Once | OROMUCOSAL | Status: AC

## 2020-08-28 MED ORDER — VASOPRESSIN 20 UNIT/ML IV SOLN
INTRAVENOUS | Status: AC
  Filled 2020-08-28: qty 1

## 2020-08-28 MED ORDER — FENTANYL CITRATE (PF) 250 MCG/5ML IJ SOLN
INTRAMUSCULAR | Status: AC
  Filled 2020-08-28: qty 5

## 2020-08-28 MED ORDER — CIPROFLOXACIN HCL 250 MG PO TABS: 250.0000 mg | ORAL_TABLET | Freq: Two times a day (BID) | ORAL | 0 refills | Status: DC

## 2020-08-28 MED ORDER — KETAMINE HCL 10 MG/ML IJ SOLN
INTRAMUSCULAR | Status: DC | PRN
  Administered 2020-08-28 (×2): 10 mg via INTRAVENOUS

## 2020-08-28 MED ORDER — KETAMINE HCL 10 MG/ML IJ SOLN
INTRAMUSCULAR | Status: AC
  Filled 2020-08-28: qty 1

## 2020-08-28 MED ORDER — FENTANYL CITRATE (PF) 100 MCG/2ML IJ SOLN
INTRAMUSCULAR | Status: DC | PRN
  Administered 2020-08-28 (×2): 50 ug via INTRAVENOUS

## 2020-08-28 MED ORDER — ONDANSETRON HCL 4 MG/2ML IJ SOLN
INTRAMUSCULAR | Status: DC | PRN
  Administered 2020-08-28: 4 mg via INTRAVENOUS

## 2020-08-28 MED ORDER — LIDOCAINE 2% (20 MG/ML) 5 ML SYRINGE
INTRAMUSCULAR | Status: DC | PRN
  Administered 2020-08-28: 40 mg via INTRAVENOUS
  Administered 2020-08-28: 60 mg via INTRAVENOUS

## 2020-08-28 MED ORDER — DEXMEDETOMIDINE (PRECEDEX) IN NS 20 MCG/5ML (4 MCG/ML) IV SYRINGE
PREFILLED_SYRINGE | INTRAVENOUS | Status: DC | PRN
  Administered 2020-08-28 (×4): 4 ug via INTRAVENOUS

## 2020-08-28 MED ORDER — 0.9 % SODIUM CHLORIDE (POUR BTL) OPTIME
TOPICAL | Status: DC | PRN
  Administered 2020-08-28: 1000 mL

## 2020-08-28 MED ORDER — PHENYLEPHRINE HCL-NACL 10-0.9 MG/250ML-% IV SOLN
INTRAVENOUS | Status: DC | PRN
  Administered 2020-08-28: 10 ug/min via INTRAVENOUS

## 2020-08-28 MED ORDER — FENTANYL CITRATE (PF) 100 MCG/2ML IJ SOLN: 25.0000 ug | INTRAMUSCULAR | Status: DC | PRN

## 2020-08-28 MED ORDER — ROCURONIUM BROMIDE 10 MG/ML (PF) SYRINGE
PREFILLED_SYRINGE | INTRAVENOUS | Status: DC | PRN
  Administered 2020-08-28: 10 mg via INTRAVENOUS
  Administered 2020-08-28: 30 mg via INTRAVENOUS
  Administered 2020-08-28: 10 mg via INTRAVENOUS

## 2020-08-28 MED ORDER — PHENYLEPHRINE HCL (PRESSORS) 10 MG/ML IV SOLN
INTRAVENOUS | Status: DC | PRN
  Administered 2020-08-28: 100 ug via INTRAVENOUS
  Administered 2020-08-28: 80 ug via INTRAVENOUS

## 2020-08-28 MED ORDER — PROPOFOL 10 MG/ML IV BOLUS
INTRAVENOUS | Status: AC
  Filled 2020-08-28: qty 20

## 2020-08-28 SURGICAL SUPPLY — 22 items
BAG URO CATCHER STRL LF (MISCELLANEOUS) ×2 IMPLANT
BASKET ZERO TIP NITINOL 2.4FR (BASKET) ×2 IMPLANT
CATH URET 5FR 28IN OPEN ENDED (CATHETERS) ×2 IMPLANT
CLOTH BEACON ORANGE TIMEOUT ST (SAFETY) ×2 IMPLANT
DRSG TEGADERM 2-3/8X2-3/4 SM (GAUZE/BANDAGES/DRESSINGS) IMPLANT
EXTRACTOR STONE 1.7FRX115CM (UROLOGICAL SUPPLIES) IMPLANT
GLOVE SURG ENC MOIS LTX SZ6.5 (GLOVE) ×2 IMPLANT
GOWN STRL REUS W/TWL LRG LVL3 (GOWN DISPOSABLE) ×2 IMPLANT
GOWN STRL REUS W/TWL XL LVL3 (GOWN DISPOSABLE) ×4 IMPLANT
GUIDEWIRE STR DUAL SENSOR (WIRE) ×4 IMPLANT
KIT TURNOVER KIT A (KITS) ×2 IMPLANT
LASER FIB FLEXIVA PULSE ID 365 (Laser) IMPLANT
LASER FIB FLEXIVA PULSE ID 550 (Laser) ×2 IMPLANT
MANIFOLD NEPTUNE II (INSTRUMENTS) ×2 IMPLANT
PACK CYSTO (CUSTOM PROCEDURE TRAY) ×2 IMPLANT
SHEATH URETERAL 12FRX28CM (UROLOGICAL SUPPLIES) ×2 IMPLANT
SHEATH URETERAL 12FRX35CM (MISCELLANEOUS) IMPLANT
STENT URET 6FRX24 CONTOUR (STENTS) ×2 IMPLANT
TRACTIP FLEXIVA PULS ID 200XHI (Laser) ×1 IMPLANT
TRACTIP FLEXIVA PULSE ID 200 (Laser) ×2
TUBING CONNECTING 10 (TUBING) ×2 IMPLANT
TUBING UROLOGY SET (TUBING) ×2 IMPLANT

## 2020-08-28 NOTE — Progress Notes (Signed)
In and out catheter performed to obtain urine specimen.  Sample sent to lab.  Patient tolerated well.  Gaynelle Cage, RN assisted.

## 2020-08-28 NOTE — Anesthesia Procedure Notes (Signed)
Procedure Name: Intubation Date/Time: 08/28/2020 11:34 AM Performed by: Adalberto Ill, CRNA Pre-anesthesia Checklist: Patient identified, Emergency Drugs available, Suction available, Patient being monitored and Timeout performed Patient Re-evaluated:Patient Re-evaluated prior to induction Oxygen Delivery Method: Circle system utilized Preoxygenation: Pre-oxygenation with 100% oxygen Induction Type: Combination inhalational/ intravenous induction and Tracheostomy Tube type: Reinforced Tube size: 6.0 mm Number of attempts: 1 (brief) Airway Equipment and Method: Stylet Placement Confirmation: positive ETCO2 and breath sounds checked- equal and bilateral Secured at: 6 cm Tube secured with: Tape Dental Injury: Teeth and Oropharynx as per pre-operative assessment

## 2020-08-28 NOTE — OR Nursing (Signed)
Stone taken by Dr. Pace 

## 2020-08-28 NOTE — Anesthesia Preprocedure Evaluation (Signed)
Anesthesia Evaluation  Patient identified by MRN, date of birth, ID band Patient awake    Reviewed: Allergy & Precautions, H&P , NPO status , Patient's Chart, lab work & pertinent test results  Airway Mallampati: Trach  TM Distance: >3 FB Neck ROM: Full   Comment: 4.0 Shiley uncuffed Dental no notable dental hx. (+) Edentulous Upper, Edentulous Lower   Pulmonary former smoker,    Pulmonary exam normal breath sounds clear to auscultation       Cardiovascular hypertension, Normal cardiovascular exam Rhythm:Regular Rate:Normal  EF normal, no valvular abnormalities   Neuro/Psych CVA, Residual Symptoms negative psych ROS   GI/Hepatic negative GI ROS, Neg liver ROS,   Endo/Other  diabetes  Renal/GU negative Renal ROS  negative genitourinary   Musculoskeletal negative musculoskeletal ROS (+)   Abdominal   Peds negative pediatric ROS (+)  Hematology negative hematology ROS (+)   Anesthesia Other Findings   Reproductive/Obstetrics negative OB ROS                             Anesthesia Physical Anesthesia Plan  ASA: III  Anesthesia Plan: General   Post-op Pain Management:    Induction: Inhalational  PONV Risk Score and Plan: 3 and Ondansetron, Dexamethasone and Treatment may vary due to age or medical condition  Airway Management Planned: Tracheostomy  Additional Equipment:   Intra-op Plan:   Post-operative Plan:   Informed Consent: I have reviewed the patients History and Physical, chart, labs and discussed the procedure including the risks, benefits and alternatives for the proposed anesthesia with the patient or authorized representative who has indicated his/her understanding and acceptance.     Dental advisory given  Plan Discussed with: CRNA and Surgeon  Anesthesia Plan Comments:         Anesthesia Quick Evaluation

## 2020-08-28 NOTE — H&P (Signed)
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 CT imaging showed 2 distal right ureteral calculi and mild hydronephrosis. She underwent emergent stent placement during hospitalization. Unfortunately, she was lost to follow up. She presents today with a retained right ureteral stent and complaints of lower abdominal pain and left flank pain. She is having some intermittent gross hematuria as well. Her son denies fevers, or altered mental status. She denies nausea and vomiting. She denies painful urination. She is currently living at home with her husband and son who care for her. She is non-verbal, but communicates well with body language.     ALLERGIES: codeine oxycodone    MEDICATIONS: Amlodipine Besylate 10 mg tablet  Atrovent Hfa 17 mcg/actuation hfa aerosol with adapter  Baclofen  Celexa  Coreg 12.5 mg tablet  Flonase Sensimist 27.5 mcg/actuation spray, suspension  Gabapentin 600 mg tablet  Imodium A-D 2 mg tablet  Insulin Aspart Flexpen  Ketoconazole 2 % cream  Spiriva  Vitamin D2 1,250 mcg (50,000 unit) capsule  Xarelto 20 mg tablet  Zofran 4 mg tablet     GU PSH: Cystoscopy Ureteroscopy, Right - 01/30/2020     NON-GU PSH: None   GU PMH: Renal and ureteral calculus    NON-GU PMH: COPD Coronary Artery Disease Depression Diabetes Type 2 Fibromyalgia Pulmonary Embolism, History Stroke/TIA    FAMILY HISTORY: None   SOCIAL HISTORY: None   REVIEW OF SYSTEMS:    GU Review Female:   Patient reports burning /pain with urination. Patient denies frequent urination, hard to postpone urination, get up at night to urinate, leakage of urine, stream starts and stops, trouble starting your stream, have to strain to urinate, and being pregnant.  Gastrointestinal  (Upper):   Patient denies nausea, vomiting, and indigestion/ heartburn.  Gastrointestinal (Lower):   Patient denies diarrhea and constipation.  Constitutional:   Patient denies fever, night sweats, weight loss, and fatigue.  Skin:   Patient denies skin rash/ lesion and itching.  Eyes:   Patient denies double vision and blurred vision.  Ears/ Nose/ Throat:   Patient denies sore throat and sinus problems.  Hematologic/Lymphatic:   Patient denies swollen glands and easy bruising.  Cardiovascular:   Patient denies leg swelling and chest pains.  Respiratory:   Patient denies cough and shortness of breath.  Endocrine:   Patient denies excessive thirst.  Musculoskeletal:   Patient denies back pain and joint pain.  Neurological:   Patient denies headaches and dizziness.  Psychologic:   Patient denies depression and anxiety.   Notes: abdominal pain    VITAL SIGNS:      08/01/2020 10:01 AM  Weight 200 lb / 90.72 kg  BP 124/73 mmHg  Pulse 82 /min  Temperature 97.9 F / 36.6 C   MULTI-SYSTEM PHYSICAL EXAMINATION:    Constitutional: Obese. Severe physical deformities. Normally developed. Good grooming.   Respiratory: Tracheostomy  Cardiovascular: Normal temperature, normal extremity pulses, no swelling, no varicosities.  Skin: No paleness, no jaundice, no cyanosis. No lesion, no ulcer, no rash.  Neurologic / Psychiatric: Oriented to person and place. pleasant affect.   Gastrointestinal: No mass, no tenderness, no rigidity, non obese abdomen.  Ears, Nose, Mouth, and Throat: Patient is wearing mask due to potential risk of contracting illness. Left ear no scars,  no lesions, no masses. Right ear no scars, no lesions, no masses. Normal hearing.   Musculoskeletal: In wheel chair     Complexity of Data:  Source Of History:  Patient, Family/Caregiver, Medical Record Summary  Records Review:   Previous Doctor Records, Previous Patient Records  Urine Test Review:   Urinalysis, Urine Culture CATH   Urodynamics Review:   Review Bladder Scan  X-Ray Review: C.T. Abdomen/Pelvis: Reviewed Films. Reviewed Report.     08/01/20  Urinalysis  Urine Appearance Cloudy   Urine Color Brown   Urine Glucose Invalid mg/dL  Urine Bilirubin Invalid mg/dL  Urine Ketones Invalid mg/dL  Urine Specific Gravity Invalid   Urine Blood Invalid ery/uL  Urine pH Invalid   Urine Protein Invalid mg/dL  Urine Urobilinogen Invalid mg/dL  Urine Nitrites Invalid   Urine Leukocyte Esterase Invalid leu/uL  Urine WBC/hpf 20 - 40/hpf   Urine RBC/hpf >60/hpf   Urine Epithelial Cells NS (Not Seen)   Urine Bacteria Mod (26-50/hpf)   Urine Mucous Not Present   Urine Yeast NS (Not Seen)   Urine Trichomonas Not Present   Urine Cystals NS (Not Seen)   Urine Casts NS (Not Seen)   Urine Sperm Not Present    PROCEDURES:         C.T. Urogram - [id number]      Patient confirmed No Neulasta OnPro Device.        PVR Ultrasound - 66440  Scanned Volume: 41 cc        In and Out Catheterization - 51701  A 14 French clear catheter was inserted into the bladder using sterile technique. A urinalysis was sent to the lab. A urine culture was sent to the lab. 55 cc of urine was obtained.         Urinalysis w/Scope Dipstick Dipstick Cont'd Micro  Color: Brown Bilirubin: Invalid mg/dL WBC/hpf: 20 - 40/hpf  Appearance: Cloudy Ketones: Invalid mg/dL RBC/hpf: >60/hpf  Specific Gravity: Invalid Blood: Invalid ery/uL Bacteria: Mod (26-50/hpf)  pH: Invalid Protein: Invalid mg/dL Cystals: NS (Not Seen)  Glucose: Invalid mg/dL Urobilinogen: Invalid mg/dL Casts: NS (Not Seen)    Nitrites: Invalid Trichomonas: Not Present    Leukocyte Esterase: Invalid leu/uL Mucous: Not Present      Epithelial Cells: NS (Not Seen)      Yeast: NS (Not Seen)      Sperm: Not Present    Notes: No dip and unspun micro due to color/clarity          Ceftriaxone 1g - 34742, V9563 Qty: 1 Adm. By: Cristobal Goldmann  Unit: gram Lot No [id number]  Route: IM Exp.  Date 06/23/2021  Freq: None Mfgr.:   Site: Left Buttock   ASSESSMENT:      ICD-10 Details  1 GU:   Other mechanical complication indwelling ureteral stent, initial enc - T83.192A Undiagnosed New Problem  2   Acute Cystitis/UTI - N30.00 Acute, Systemic Symptoms  3   Renal and ureteral calculus - N20.2 Acute, Systemic Symptoms   PLAN:           Orders Labs Urine Culture CATH, Urine Culture CATH  X-Rays: C.T. Stone Protocol Without Contrast - retained Stent from August.   X-Ray Notes: History:  Hematuria: Yes/No  Patient to see MD after exam: Yes/No  Previous exam: CT / IVP/ US/ KUB/ None  When:  Where:  Diabetic: Yes/ No  BUN/ Creatine:  Date of last BUN Creatinine:  Weight in pounds:  Allergy- Contrasts/ Shellfish: Yes/ No  Conflicting diabetic meds: Yes/ No  Oral contrast and instructions given to patient:   Prior Authorization #: [id number] valid 08/01/20 thru 08/30/20            Schedule Procedure: 08/01/2020 at [facility] - [phone] - Ceftriaxone 1g (Injection, Ceftriaxone Sodium, Per 250 Mg) - Q6386, 85488          Document Letter(s):  Created for Patient: Clinical Summary         Notes:   Urinalysis is concerning for an infectious process today. Will send for culture and begin empirical treatment with 1 gram of IM rocephin. I will notify the family of culture results and treat accordingly. CT scan completed to assess retained right ureteral stent. Awaiting final radiology read. She will most likely require a staged procedure, however I will follow up with her urologist and discuss her recommendations further once I have talked with Dr. Claudia Desanctis. I advised strict return precautions in the interval. Her family verbalized understanding.

## 2020-08-28 NOTE — Interval H&P Note (Signed)
History and Physical Interval Note:  08/28/2020 10:17 AM  Kerry [name]  has presented today for surgery, with the diagnosis of RIGHT URETERAL STENT-RETAINED, RIGHT NEPHROLITHIASIS.  The various methods of treatment have been discussed with the patient and family. After consideration of risks, benefits and other options for treatment, the patient has consented to  Procedure(s) with comments: CYSTOSCOPY WITH RETROGRADE PYELOGRAM, URETEROSCOPY AND STENT PLACEMENT (Right) - 2 HRS HOLMIUM LASER APPLICATION (Right) as a surgical intervention.  The patient's history has been reviewed, patient examined, no change in status, stable for surgery.  I have reviewed the patient's chart and labs.  Questions were answered to the patient's satisfaction.     Amberlin Utke D Kalven Ganim

## 2020-08-28 NOTE — Discharge Instructions (Signed)
Removal of retained stent, ureteroscopy with removal of stone, and replacement of temporary post stent placement instructions   Definitions:  Ureter: The duct that transports urine from the kidney to the bladder. Stent: A plastic hollow tube that is placed into the ureter, from the kidney to the bladder to prevent the ureter from swelling shut.  General instructions:  Despite the fact that no skin incisions were used, the area around the ureter and bladder is raw and irritated. The stent is a foreign body which can further irritate the bladder wall. This irritation is manifested by increased frequency of urination, both day and night, and by an increase in the urge to urinate. In some, the urge to urinate is present almost always. Sometimes the urge is strong enough that you may not be able to stop your self from urinating. This can often be controlled with medication but does not occur in everyone. A stent can safely be left in place for 3 months or greater.  You may see some blood in your urine while the stent is in place and a few days afterward. Do not be alarmed, even if the urine is clear for a while. Get off your feet and drink lots of fluids until clearing occurs. If you start to pass clots or don't improve, call us.  Diet:  You may return to your normal diet immediately. Because of the raw surface of your bladder, alcohol, spicy foods, foods high in acid and drinks with caffeine may cause irritation or frequency and should be used in moderation. To keep your urine flowing freely and avoid constipation, drink plenty of fluids during the day (8-10 glasses). Tip: Avoid cranberry juice because it is very acidic.  Activity:  Your physical activity doesn't need to be restricted. However, if you are very active, you may see some blood in the urine. We suggest that you reduce your activity under the circumstances until the bleeding has stopped.  Bowels:  It is important to keep your bowels  regular during the postoperative period. Straining with bowel movements can cause bleeding. A bowel movement every other day is reasonable. Use a mild laxative if needed, such as milk of magnesia 2-3 tablespoons, or 2 Dulcolax tablets. Call if you continue to have problems. If you had been taking narcotics for pain, before, during or after your surgery, you may be constipated. Take a laxative if necessary.  Medication:  You should resume your pre-surgery medications unless told not to. In addition you may be given an antibiotic to prevent or treat infection. Antibiotics are not always necessary. All medication should be taken as prescribed until the bottles are finished unless you are having an unusual reaction to one of the drugs.  Problems you should report to [place]:  a. Fever greater than 101F. b. Heavy bleeding, or clots (see notes above about blood in urine). c. Inability to urinate. d. Drug reactions (hives, rash, nausea, vomiting, diarrhea). e. Severe burning or pain with urination that is not improving.  The ureteral stent can be removed on Friday, 09/01/19.  This be done by pulling on the black string that is in the vagina.  Pull until the entire blue tube has been removed from the urethra.  Please continue the antibiotics as prescribed for infection prevention.

## 2020-08-28 NOTE — Transfer of Care (Signed)
Immediate Anesthesia Transfer of Care Note  Patient: Kerry [name]  Procedure(s) Performed: CYSTOSCOPY WITH RETROGRADE PYELOGRAM, URETEROSCOPY, STONE BASKETRY AND STENT PLACEMENT (Right ) HOLMIUM LASER APPLICATION (Right )  Patient Location: PACU  Anesthesia Type:General  Level of Consciousness: awake, alert , oriented and patient cooperative  Airway & Oxygen Therapy: Patient Spontanous Breathing and Patient connected to face mask oxygen  Post-op Assessment: Report given to RN and Post -op Vital signs reviewed and stable  Post vital signs: Reviewed and stable  Last Vitals:  Vitals Value Taken Time  BP 97/54 08/28/20 1324  Temp    Pulse 72 08/28/20 1329  Resp 13 08/28/20 1329  SpO2 99 % 08/28/20 1329  Vitals shown include unvalidated device data.  Last Pain:  Vitals:   08/28/20 0918  TempSrc: Oral  PainSc:          Complications: No complications documented.

## 2020-08-28 NOTE — Op Note (Signed)
Preoperative diagnosis: retained ureteral stent with renal and ureteral calculi  Postoperative diagnosis: retained ureteral stent with renal and ureteral calculi  Procedure:  1. Cystoscopy litholapaxy 2. Ureteroscopy with laser lithotripsy and basket stone extraction 3. Removal of retained right ureteral stent 4. left 89F x 24 ureteral stent placement with tether 5. right retrograde pyelography with interpretation  Surgeon: Jacalyn Lefevre, MD  Anesthesia: General  Complications: None  Intraoperative findings:  1.  Normal urethra 2.  Large bladder calculus adherent to distal curl of ureteral stent and bladder 3.  Right retrograde pyelogram with hydronephrosis and significant stone burden seen in renal pelvis 4.  Removal of retained right ureteral stent and replacement with 6 [place] by 24 cm JJ stent with tether  EBL: Minimal  Specimens: 1. right ureteral calculus  Disposition of specimens: [facility] for stone analysis  Indication: Kerry [name] is a 65 y.o.   patient who initially underwent emergent right ureteral stent placement for distal right ureteral calculi with hydronephrosis and concern for infection in August 2021.  She was then lost to follow-up and represented with retained right ureteral stent and significant stone burden both in bladder and right kidney. After reviewing the management options for treatment, the patient elected to proceed with the above surgical procedure(s). We have discussed the potential benefits and risks of the procedure, side effects of the proposed treatment, the likelihood of the patient achieving the goals of the procedure, and any potential problems that might occur during the procedure or recuperation. Informed consent has been obtained.   Description of procedure:  The patient was taken to the operating room and general anesthesia was induced.  The patient was placed in the dorsal lithotomy position, prepped and draped  in the usual sterile fashion, and preoperative antibiotics were administered. A preoperative time-out was performed.   Cystourethroscopy was performed.  The patient's urethra was examined and was normal.The bladder was then systematically examined in its entirety. There was no evidence for any bladder tumors or other mucosal pathology.    A 500 m holmium laser fiber was then used to fragment the greater than 2 cm stone seen adherent to the distal end of the right ureteral stent in the bladder.  The stones were then flushed from the bladder through the cystoscope.  Next a 0.38 sensor wire was used to cannulate the right ureteral orifice and advanced alongside the stent to the kidney with fluoroscopic guidance.  Graspers were then used to gently grab the distal end of the stent and it was removed with fluoroscopic guidance taking care not to pull against resistance.  Next an open-ended ureteral catheter was placed alongside the wire and a retrograde pyelogram was obtained.  This showed dilated ureter with stone burden in the right renal pelvis.  A second wire was placed through the ureteral catheter and the ureteral catheter was removed.  Next a ureteral access sheath was placed over the second wire and advanced to the kidney with fluoroscopic guidance.  The inner sheath and wire removed.  Flexible ureteroscopy then took place.  There were multiple calculi in the right upper pole and pelvis that were fragmented with a 200 m laser fiber. All stone fragments greater than 2 mm in size were then removed from the ureter with a 0 tip nitinol basket.  Reinspection of the ureter revealed no remaining visible stones or fragments.   The wire was then backloaded through the cystoscope and a ureteral stent was advance over the wire using  Seldinger technique.  The stent was positioned appropriately under fluoroscopic and cystoscopic guidance.  The wire was then removed with an adequate stent curl noted in the renal  pelvis as well as in the bladder.  The bladder was then emptied and the procedure ended.  The patient appeared to tolerate the procedure well and without complications.  The patient was able to be awakened and transferred to the recovery unit in satisfactory condition.   Disposition: The tether of the stent was left on and  tucked inside the patient's vagina.  Instructions for removing the stent have been provided to the patient. The patient has been scheduled for followup in 4 weeks with a renal ultrasound.

## 2020-08-29 ENCOUNTER — Encounter (HOSPITAL_COMMUNITY): Payer: Self-pay | Admitting: Urology

## 2020-08-29 NOTE — Anesthesia Postprocedure Evaluation (Signed)
Anesthesia Post Note  Patient: Kerry [name]  Procedure(s) Performed: CYSTOSCOPY WITH RETROGRADE PYELOGRAM, URETEROSCOPY, STONE BASKETRY AND STENT PLACEMENT (Right ) HOLMIUM LASER APPLICATION (Right )     Patient location during evaluation: PACU Anesthesia Type: General Level of consciousness: sedated Pain management: pain level controlled Vital Signs Assessment: post-procedure vital signs reviewed and stable Respiratory status: spontaneous breathing Cardiovascular status: stable Anesthetic complications: no   No complications documented.  Last Vitals:  Vitals:   08/28/20 1455 08/28/20 1500  BP: (!) 128/59 (!) 131/56  Pulse: 73 66  Resp: 12   Temp: 36.7 C   SpO2: 92% 92%    Last Pain:  Vitals:   08/28/20 1445  TempSrc:   PainSc: 0-No pain                 Nolon Nations

## 2020-08-30 LAB — URINE CULTURE
Culture: >=100000 — AB
Special Requests: NORMAL

## 2020-10-05 ENCOUNTER — Emergency Department (HOSPITAL_COMMUNITY): Payer: 59

## 2020-10-05 ENCOUNTER — Encounter (HOSPITAL_COMMUNITY): Payer: Self-pay | Admitting: Emergency Medicine

## 2020-10-05 ENCOUNTER — Emergency Department (HOSPITAL_COMMUNITY)
Admission: EM | Admit: 2020-10-05 | Discharge: 2020-10-05 | Disposition: A | Discharge status: Home / Self Care | Payer: 59 | Attending: Emergency Medicine | Admitting: Emergency Medicine

## 2020-10-05 ENCOUNTER — Other Ambulatory Visit: Payer: Self-pay

## 2020-10-05 DIAGNOSIS — I2510 Atherosclerotic heart disease of native coronary artery without angina pectoris: Secondary | ICD-10-CM | POA: Diagnosis not present

## 2020-10-05 DIAGNOSIS — E1159 Type 2 diabetes mellitus with other circulatory complications: Secondary | ICD-10-CM | POA: Insufficient documentation

## 2020-10-05 DIAGNOSIS — Z794 Long term (current) use of insulin: Secondary | ICD-10-CM | POA: Insufficient documentation

## 2020-10-05 DIAGNOSIS — Z79899 Other long term (current) drug therapy: Secondary | ICD-10-CM | POA: Insufficient documentation

## 2020-10-05 DIAGNOSIS — E119 Type 2 diabetes mellitus without complications: Secondary | ICD-10-CM | POA: Insufficient documentation

## 2020-10-05 DIAGNOSIS — I10 Essential (primary) hypertension: Secondary | ICD-10-CM | POA: Diagnosis not present

## 2020-10-05 DIAGNOSIS — Z853 Personal history of malignant neoplasm of breast: Secondary | ICD-10-CM | POA: Diagnosis not present

## 2020-10-05 DIAGNOSIS — R519 Headache, unspecified: Secondary | ICD-10-CM | POA: Diagnosis present

## 2020-10-05 DIAGNOSIS — Z87891 Personal history of nicotine dependence: Secondary | ICD-10-CM | POA: Insufficient documentation

## 2020-10-05 DIAGNOSIS — Z8673 Personal history of transient ischemic attack (TIA), and cerebral infarction without residual deficits: Secondary | ICD-10-CM | POA: Insufficient documentation

## 2020-10-05 LAB — CBC WITH DIFFERENTIAL/PLATELET
Abs Immature Granulocytes: 0.04 10*3/uL (ref 0.00–0.07)
Basophils Absolute: 0 10*3/uL (ref 0.0–0.1)
Basophils Relative: 0 %
Eosinophils Absolute: 0.2 10*3/uL (ref 0.0–0.5)
Eosinophils Relative: 2 %
HCT: 38.4 % (ref 36.0–46.0)
Hemoglobin: 11.6 g/dL — ABNORMAL LOW (ref 12.0–15.0)
Immature Granulocytes: 0 %
Lymphocytes Relative: 29 %
Lymphs Abs: 2.9 10*3/uL (ref 0.7–4.0)
MCH: 24.5 pg — ABNORMAL LOW (ref 26.0–34.0)
MCHC: 30.2 g/dL (ref 30.0–36.0)
MCV: 81 fL (ref 80.0–100.0)
Monocytes Absolute: 0.7 10*3/uL (ref 0.1–1.0)
Monocytes Relative: 7 %
Neutro Abs: 6 10*3/uL (ref 1.7–7.7)
Neutrophils Relative %: 62 %
Platelets: 265 10*3/uL (ref 150–400)
RBC: 4.74 MIL/uL (ref 3.87–5.11)
RDW: 15.2 % (ref 11.5–15.5)
WBC: 9.8 10*3/uL (ref 4.0–10.5)
nRBC: 0 % (ref 0.0–0.2)

## 2020-10-05 LAB — COMPREHENSIVE METABOLIC PANEL
ALT: 12 U/L (ref 0–44)
AST: 12 U/L — ABNORMAL LOW (ref 15–41)
Albumin: 3.6 g/dL (ref 3.5–5.0)
Alkaline Phosphatase: 56 U/L (ref 38–126)
Anion gap: 9 (ref 5–15)
BUN: 18 mg/dL (ref 8–23)
CO2: 24 mmol/L (ref 22–32)
Calcium: 8.7 mg/dL — ABNORMAL LOW (ref 8.9–10.3)
Chloride: 106 mmol/L (ref 98–111)
Creatinine, Ser: 0.54 mg/dL (ref 0.44–1.00)
GFR, Estimated: >60 mL/min (ref >60)
Glucose, Bld: 114 mg/dL — ABNORMAL HIGH (ref 70–99)
Potassium: 3.4 mmol/L — ABNORMAL LOW (ref 3.5–5.1)
Sodium: 139 mmol/L (ref 135–145)
Total Bilirubin: 0.3 mg/dL (ref 0.3–1.2)
Total Protein: 7.5 g/dL (ref 6.5–8.1)

## 2020-10-05 MED ORDER — DIPHENHYDRAMINE HCL 50 MG/ML IJ SOLN
12.5000 mg | Freq: Once | INTRAMUSCULAR | Status: AC
  Administered 2020-10-05: 12.5 mg via INTRAVENOUS
  Filled 2020-10-05: qty 1

## 2020-10-05 MED ORDER — METOCLOPRAMIDE HCL 5 MG/ML IJ SOLN
5.0000 mg | Freq: Once | INTRAMUSCULAR | Status: AC
  Administered 2020-10-05: 5 mg via INTRAVENOUS
  Filled 2020-10-05: qty 2

## 2020-10-05 MED ORDER — KETOROLAC TROMETHAMINE 30 MG/ML IJ SOLN
30.0000 mg | Freq: Once | INTRAMUSCULAR | Status: AC
  Administered 2020-10-05: 30 mg via INTRAVENOUS
  Filled 2020-10-05: qty 1

## 2020-10-05 MED ORDER — SODIUM CHLORIDE 0.9 % IV BOLUS
1000.0000 mL | Freq: Once | INTRAVENOUS | Status: AC
  Administered 2020-10-05: 1000 mL via INTRAVENOUS

## 2020-10-05 NOTE — ED Triage Notes (Addendum)
Per [facility] pt has hx of stroke with right-sided deficits; called out for left sided headache

## 2020-10-05 NOTE — ED Provider Notes (Signed)
Care assumed from [facility], [place]. See her note for full H&P.   Per her note, "Kerry [name] is a 65 y.o. 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  Patient has significant aphasia, level 5 caveat due to this.  Patient's son is at bedside is able to tell most the story.  Patient's son states that for the past week she has had a headache, saw primary care who diagnosed her with sinusitis.  States that headache has continued even with the week of Augmentin that she has been on.  Has been compliant with Augmentin.  States that she is primarily nonverbal, will say random words at times.  Is able to nod yes and no, does follow commands.  States that she is currently at baseline.  Triage note states that EMS was called out for left-sided weakness, however son states this is not true.  No concerns with left leg.  No fevers or neck pain at home.  No trauma..  No vomiting or diarrhea.  No rashes.  No trauma without any falls.  Son does state that the patient does have a remote history of migraines."  Physical Exam  BP 115/84   Pulse (!) 59   Temp 98.5 F (36.9 C) (Oral)   Resp 15   Ht 5\' 4"  (1.626 m)   Wt 90.7 kg   SpO2 98%   BMI 34.32 kg/m   Physical Exam Vitals and nursing note reviewed.  Constitutional:      General: She is not in acute distress.    Appearance: She is well-developed.  HENT:     Head: Normocephalic and atraumatic.  Eyes:     Conjunctiva/sclera: Conjunctivae normal.  Cardiovascular:     Rate and Rhythm: Normal rate.  Pulmonary:     Effort: Pulmonary effort is normal.  Abdominal:     General: There is no distension.  Musculoskeletal:     Cervical back: Neck supple.  Skin:    General: Skin is warm and dry.  Neurological:     Mental Status: She is alert.     Comments: Nods yes/no to questions, no facial droop (at neurologic baseline per son at  bedside)       ED Course/Procedures     Procedures  Results for orders placed or performed during the hospital encounter of 10/05/20  Comprehensive metabolic panel  Result Value Ref Range   Sodium 139 135 - 145 mmol/L   Potassium 3.4 (L) 3.5 - 5.1 mmol/L   Chloride 106 98 - 111 mmol/L   CO2 24 22 - 32 mmol/L   Glucose, Bld 114 (H) 70 - 99 mg/dL   BUN 18 8 - 23 mg/dL   Creatinine, Ser 0.54 0.44 - 1.00 mg/dL   Calcium 8.7 (L) 8.9 - 10.3 mg/dL   Total Protein 7.5 6.5 - 8.1 g/dL   Albumin 3.6 3.5 - 5.0 g/dL   AST 12 (L) 15 - 41 U/L   ALT 12 0 - 44 U/L   Alkaline Phosphatase 56 38 - 126 U/L   Total Bilirubin 0.3 0.3 - 1.2 mg/dL   GFR, Estimated >60 >60 mL/min   Anion gap 9 5 - 15  CBC with Differential  Result Value Ref Range   WBC 9.8 4.0 - 10.5 K/uL   RBC 4.74 3.87 - 5.11 MIL/uL   Hemoglobin 11.6 (L) 12.0 - 15.0 g/dL   HCT  38.4 36.0 - 46.0 %   MCV 81.0 80.0 - 100.0 fL   MCH 24.5 (L) 26.0 - 34.0 pg   MCHC 30.2 30.0 - 36.0 g/dL   RDW 15.2 11.5 - 15.5 %   Platelets 265 150 - 400 K/uL   nRBC 0.0 0.0 - 0.2 %   Neutrophils Relative % 62 %   Neutro Abs 6.0 1.7 - 7.7 K/uL   Lymphocytes Relative 29 %   Lymphs Abs 2.9 0.7 - 4.0 K/uL   Monocytes Relative 7 %   Monocytes Absolute 0.7 0.1 - 1.0 K/uL   Eosinophils Relative 2 %   Eosinophils Absolute 0.2 0.0 - 0.5 K/uL   Basophils Relative 0 %   Basophils Absolute 0.0 0.0 - 0.1 K/uL   Immature Granulocytes 0 %   Abs Immature Granulocytes 0.04 0.00 - 0.07 K/uL   CT Head Wo Contrast  Result Date: 10/05/2020 CLINICAL DATA:  Headache, new or worsening. Additional provided: History of stroke, right-sided deficits, left-sided headache. EXAM: CT HEAD WITHOUT CONTRAST TECHNIQUE: Contiguous axial images were obtained from the base of the skull through the vertex without intravenous contrast. COMPARISON:  Prior head CT examinations 04/12/2016. Brain MRI 06/21/2015. FINDINGS: Brain: Redemonstrated very large chronic left MCA territory  infarct. Ex vacuo dilatation of the left lateral ventricle. Additionally, there is wallerian degeneration on the left extending to the brainstem. New from the prior head CT of 04/12/2016, there is a heterogeneously calcified focus within the left frontoparietal infarction territory which measures 3.1 x 1.4 cm in transaxial dimensions. This has an appearance most suggestive of dystrophic calcification. Background cerebral volume is within normal limits. There is no acute intracranial hemorrhage. No acute demarcated cortical infarct. No extra-axial fluid collection. No evidence of intracranial mass. No midline shift. Vascular: No hyperdense vessel.  Atherosclerotic calcifications. Skull: Normal. Negative for fracture or focal lesion. Sinuses/Orbits: Visualized orbits show no acute finding. Trace scattered bilateral ethmoid sinus mucosal thickening. IMPRESSION: No evidence of acute intracranial abnormality. Redemonstrated very large chronic left MCA territory infarction. New from the prior head CT of 04/12/2016, there is a 3.1 x 1.4 cm heterogeneously calcified focus within the left frontoparietal infarction territory which has an appearance most suggestive of dystrophic calcification. Mild bilateral ethmoid disease, as described. Electronically Signed   By: Kellie Simmering DO   On: 10/05/2020 16:52   MR ANGIO HEAD WO CONTRAST  Result Date: 10/05/2020 CLINICAL DATA:  Headache, new or worsening. Additional history provided: History of stroke, right-sided deficits, left-sided headache. EXAM: MRI HEAD WITHOUT CONTRAST MRA HEAD WITHOUT CONTRAST TECHNIQUE: Multiplanar, multiecho pulse sequences of the brain and surrounding structures were obtained without intravenous contrast. Angiographic images of the head were obtained using MRA technique without contrast. COMPARISON:  Prior head CT examinations 10/05/2020 and earlier. Brain MRI 06/21/2015. Report from catheter based angiography 04/11/2015. FINDINGS: MRI HEAD FINDINGS  Brain: Mild intermittent motion degradation. Redemonstrated very large chronic left MCA territory infarct with associated scattered chronic hemosiderin deposition. 3.6 x 1.5 cm focus of susceptibility artifact within the left frontoparietal infarction territory compatible with the dystrophic calcification demonstrated on the head CT performed earlier today. Associated wallerian degeneration extending into the left brainstem. Ex vacuo dilatation of the left lateral ventricle. Background mild cerebral atrophy. There is no acute infarct. No evidence of intracranial mass. No extra-axial fluid collection. No midline shift. Vascular: Signal abnormality within the left internal carotid artery at the distal cervical level and siphon level compatible with known chronic occlusion. Skull and upper cervical spine: No  focal marrow lesion. Sinuses/Orbits: Visualized orbits show no acute finding. Trace left ethmoid sinus mucosal thickening. MRA HEAD FINDINGS Mildly motion degraded examination. Absence of flow related signal within the distal cervical ICA and majority of the intracranial left ICA compatible with known chronic occlusion. There is some reconstitution of flow related signal within the supraclinoid left ICA. The intracranial right ICA is patent. The M1 left middle cerebral artery is asymmetrically diminutive and irregular. Additionally, there is a marked paucity of flow related signal within the left M2 and more distal left MCA branch vessels. These findings are likely chronic given the large chronic left MCA territory infarct. The M1 right middle cerebral artery is patent. No right M2 proximal branch occlusion or high-grade proximal stenosis. The anterior cerebral arteries are patent. The intracranial vertebral arteries are patent. The basilar artery is patent. The posterior cerebral arteries are patent. Mild/moderate stenosis within the P2 right posterior cerebral artery. Moderate stenosis within the right posterior  cerebral artery at the P2/P3 junction. Apparent severe stenoses within the posterior cerebral arteries at the P3/P4 junctions bilaterally. No intracranial aneurysm is identified. IMPRESSION: MRI brain: 1. No evidence of acute intracranial abnormality, including acute infarction. 2. Redemonstrated very large chronic left MCA territory infarct with associated scattered chronic hemosiderin deposition. 3. 3.6 x 1.5 cm focus focus of dystrophic calcification within the left frontoparietal infarction territory. 4. Wallerian degeneration extending into the left brainstem. 5. Background mild cerebral atrophy. 6. Mild left ethmoid sinus mucosal thickening. MRA head: 1. 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 There is some reconstitution of flow related signal within the supraclinoid left ICA. 2. The M1 left middle cerebral artery is diminutive and irregular. Additionally, there is a marked paucity of flow related signal within the left M2 and more distal left MCA branches. These findings are likely chronic given the patient's large chronic left MCA territory infarct. 3. Mild/moderate stenosis within the P2 right posterior cerebral artery. 4. Moderate stenosis within the right posterior cerebral artery at the P2/P3 junction. 5. Apparent severe stenoses within the posterior cerebral arteries at the P3/P4 junctions bilaterally. Electronically Signed   By: Kellie Simmering DO   On: 10/05/2020 18:16   MR Brain Wo Contrast (neuro protocol)  Result Date: 10/05/2020 CLINICAL DATA:  Headache, new or worsening. Additional history provided: History of stroke, right-sided deficits, left-sided headache. EXAM: MRI HEAD WITHOUT CONTRAST MRA HEAD WITHOUT CONTRAST TECHNIQUE: Multiplanar, multiecho pulse sequences of the brain and surrounding structures were obtained without intravenous contrast. Angiographic images of  the head were obtained using MRA technique without contrast. COMPARISON:  Prior head CT examinations 10/05/2020 and earlier. Brain MRI 06/21/2015. Report from catheter based angiography 04/11/2015. FINDINGS: MRI HEAD FINDINGS Brain: Mild intermittent motion degradation. Redemonstrated very large chronic left MCA territory infarct with associated scattered chronic hemosiderin deposition. 3.6 x 1.5 cm focus of susceptibility artifact within the left frontoparietal infarction territory compatible with the dystrophic calcification demonstrated on the head CT performed earlier today. Associated wallerian degeneration extending into the left brainstem. Ex vacuo dilatation of the left lateral ventricle. Background mild cerebral atrophy. There is no acute infarct. No evidence of intracranial mass. No extra-axial fluid collection. No midline shift. Vascular: Signal abnormality within the left internal carotid artery at the distal cervical level and siphon level compatible with known chronic occlusion. Skull and upper cervical spine: No focal marrow lesion. Sinuses/Orbits: Visualized orbits show no  acute finding. Trace left ethmoid sinus mucosal thickening. MRA HEAD FINDINGS Mildly motion degraded examination. Absence of flow related signal within the distal cervical ICA and majority of the intracranial left ICA compatible with known chronic occlusion. There is some reconstitution of flow related signal within the supraclinoid left ICA. The intracranial right ICA is patent. The M1 left middle cerebral artery is asymmetrically diminutive and irregular. Additionally, there is a marked paucity of flow related signal within the left M2 and more distal left MCA branch vessels. These findings are likely chronic given the large chronic left MCA territory infarct. The M1 right middle cerebral artery is patent. No right M2 proximal branch occlusion or high-grade proximal stenosis. The anterior cerebral arteries are patent. The  intracranial vertebral arteries are patent. The basilar artery is patent. The posterior cerebral arteries are patent. Mild/moderate stenosis within the P2 right posterior cerebral artery. Moderate stenosis within the right posterior cerebral artery at the P2/P3 junction. Apparent severe stenoses within the posterior cerebral arteries at the P3/P4 junctions bilaterally. No intracranial aneurysm is identified. IMPRESSION: MRI brain: 1. No evidence of acute intracranial abnormality, including acute infarction. 2. Redemonstrated very large chronic left MCA territory infarct with associated scattered chronic hemosiderin deposition. 3. 3.6 x 1.5 cm focus focus of dystrophic calcification within the left frontoparietal infarction territory. 4. Wallerian degeneration extending into the left brainstem. 5. Background mild cerebral atrophy. 6. Mild left ethmoid sinus mucosal thickening. MRA head: 1. 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 There is some reconstitution of flow related signal within the supraclinoid left ICA. 2. The M1 left middle cerebral artery is diminutive and irregular. Additionally, there is a marked paucity of flow related signal within the left M2 and more distal left MCA branches. These findings are likely chronic given the patient's large chronic left MCA territory infarct. 3. Mild/moderate stenosis within the P2 right posterior cerebral artery. 4. Moderate stenosis within the right posterior cerebral artery at the P2/P3 junction. 5. Apparent severe stenoses within the posterior cerebral arteries at the P3/P4 junctions bilaterally. Electronically Signed   By: Kellie Simmering DO   On: 10/05/2020 18:16     MDM   65 year old female presenting the emergency department today for evaluation of headache.  She has a history of migraines.  Also has previous history of stroke.  Family  concerned due to prolonged headache and brought patient to the ED.  MRI/MRA demonstrates several chronic findings but no acute intracranial abnormalities.  CBC and CMP are reassuring.  She was given a migraine cocktail and at shift change she is pending reassessment.  On reassessment patient reports she is feeling much better and is ready to go home.  She still has some what of a mild headache but is comfortable being discharged.  Her son is at bedside and is also comfortable with this plan.  I advised that she follow-up with her PCP and return to the ED for any new or worsening symptoms.  Son at bedside voiced understanding of plan and patient also indicated understanding.  All questions answered.  Patient stable for discharge.  Pt seen in conjunction with Dr. Alvino Chapel who personally evaluated the patient and is in agreement with the plan.     Bishop Dublin 10/05/20 1944    Davonna Belling, MD 10/05/20 [phone]

## 2020-10-05 NOTE — ED Provider Notes (Signed)
[facility] EMERGENCY DEPARTMENT Provider Note   CSN: [id number] Arrival date & time: 10/05/20  1525     History Chief Complaint  Patient presents with  . Headache    Kerry [name] is a 65 y.o. 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  Patient has significant aphasia, level 5 caveat due to this.  Patient's son is at bedside is able to tell most the story.  Patient's son states that for the past week she has had a headache, saw primary care who diagnosed her with sinusitis.  States that headache has continued even with the week of Augmentin that she has been on.  Has been compliant with Augmentin.  States that she is primarily nonverbal, will say random words at times.  Is able to nod yes and no, does follow commands.  States that she is currently at baseline.  Triage note states that EMS was called out for left-sided weakness, however son states this is not true.  No concerns with left leg.  No fevers or neck pain at home.  No trauma..  No vomiting or diarrhea.  No rashes.  No trauma without any falls.  Son does state that the patient does have a remote history of migraines.  HPI     Past Medical History:  Diagnosis Date  . Breast cancer ([place]) 03/10/2007   Left breat  . Carpal tunnel syndrome on right   . Coronary artery disease   . Degenerative disc disease, cervical   . Degenerative disc disease, lumbar   . Diabetes mellitus   . Elevated troponin   . Fibromyalgia   . GIB (gastrointestinal bleeding)   . HTN (hypertension) 06/13/2015  . Hyperlipidemia   . Hypertension   . PE (pulmonary embolism)   . 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  . Stroke ([place])    paralyzed on right side  . UTI (lower urinary tract infection)     Patient Active Problem List   Diagnosis Date Noted  . Diarrhea   . Severe  sepsis ([place]) 02/02/2020  . Morbid obesity due to excess calories ([place])   . Other chronic sinusitis   . Chronic rhinitis   . Allergic rhinitis   . S/P percutaneous endoscopic gastrostomy (PEG) tube placement ([place]) 08/14/2016  . Seizure-like activity ([place]) 06/29/2016  . History of stroke 06/29/2016  . Chronic daily headache 06/29/2016  . Cough   . Purulent bronchitis ([place])   . Tracheostomy status ([place])   . Tracheostomy dependence ([place])   . Acute tracheobronchitis   . Tracheal stenosis   . Hemoptysis   . Pressure ulcer 07/04/2015  . Aspiration into airway   . Acute encephalopathy   . Infective urethritis   . Vocal cord paresis   . Cerebrovascular accident (CVA) due to embolism of right middle cerebral artery ([place])   . Left pontine stroke ([place])   . HCAP (healthcare-associated pneumonia)   . Encounter for orogastric (OG) tube placement   . Endotracheally intubated   . Acute respiratory failure with hypoxia and hypercarbia ([place]) 06/19/2015  . Altered mental state   . Breathing difficulty   . Leukocytosis   . Tachycardia   . HTN (hypertension) 06/13/2015  . Mental status change   . PE (pulmonary embolism) 06/07/2015  . Elevated troponin 06/07/2015  . UTI (urinary tract infection) 06/07/2015  .  Coffee ground emesis 06/07/2015  . Sepsis ([place]) 06/07/2015  . Pulmonary embolism without acute cor pulmonale ([place])   . Arterial hypotension   . Hypoxemia   . Stridor   . Sinus tachycardia 06/06/2015  . Vomiting blood   . Stroke due to thrombosis of left middle cerebral artery ([place]) 05/16/2015  . Hemiparesis, aphasia, and dysphagia as late effect of cerebrovascular accident (CVA) ([place])   . Essential hypertension   . Coronary artery disease involving native coronary artery of native heart with angina pectoris ([place])   . History of breast cancer   . Fibromyalgia   . Chronic pain syndrome   . Generalized anxiety disorder   . Prerenal azotemia   . Pneumonia   . Chronic respiratory  failure ([place])   . Respiratory failure ([place])   . Hypokalemia   . Compromised airway   . HLD (hyperlipidemia)   . Type 2 diabetes mellitus with circulatory disorder ([place])   . Sinus bradycardia   . Asystole ([place])   . Acute respiratory failure ([place])   . Cerebral hemorrhage ([place])   . Cytotoxic cerebral edema ([place])   . Sinus pause   . Cerebrovascular accident (CVA) due to occlusion of left middle cerebral artery ([place])   . Cerebrovascular accident (CVA) due to thrombosis of precerebral artery ([place])   . Encounter for central line placement   . Encounter for feeding tube placement   . CVA (cerebral infarction) 04/11/2015  . Stroke (cerebrum) ([place]) 04/11/2015  . Aphasia   . Flaccid hemiplegia and hemiparesis   . Hypertensive urgency   . Uncontrolled type 2 diabetes mellitus with hyperosmolarity without coma, without long-term current use of insulin ([place])   . Breast cancer ([place]) 11/08/2012    Past Surgical History:  Procedure Laterality Date  . APPENDECTOMY    . BREAST SURGERY    . CARDIAC CATHETERIZATION  2005  . CARDIAC CATHETERIZATION N/A 04/13/2015   Procedure: Temporary Pacemaker;  Surgeon: Peter M Martinique, MD;  Location: [facility];  Service: Cardiovascular;  Laterality: N/A;  . CARDIAC SURGERY    . CYSTOSCOPY W/ URETERAL STENT PLACEMENT Right 02/06/2020   Procedure: CYSTOSCOPY WITH RETROGRADE PYELOGRAM/URETERAL STENT PLACEMENT;  Surgeon: Robley Fries, MD;  Location: [facility];  Service: Urology;  Laterality: Right;  . CYSTOSCOPY WITH RETROGRADE PYELOGRAM, URETEROSCOPY AND STENT PLACEMENT Right 08/28/2020   Procedure: CYSTOSCOPY WITH RETROGRADE PYELOGRAM, URETEROSCOPY, STONE BASKETRY AND STENT PLACEMENT;  Surgeon: Robley Fries, MD;  Location: [facility];  Service: Urology;  Laterality: Right;  . ESOPHAGOGASTRODUODENOSCOPY N/A 04/18/2015   Procedure: ESOPHAGOGASTRODUODENOSCOPY (EGD);  Surgeon: Judeth Horn, MD;  Location: [facility];  Service: General;  Laterality: N/A;  .  HOLMIUM LASER APPLICATION Right 0/11/2374   Procedure: HOLMIUM LASER APPLICATION;  Surgeon: Robley Fries, MD;  Location: [facility];  Service: Urology;  Laterality: Right;  . IR GASTROSTOMY TUBE REMOVAL  11/05/2016  . MASTECTOMY Left 03/2007  . PEG PLACEMENT N/A 04/18/2015   Procedure: PERCUTANEOUS ENDOSCOPIC GASTROSTOMY (PEG) PLACEMENT;  Surgeon: Judeth Horn, MD;  Location: [facility];  Service: General;  Laterality: N/A;  bedside  . RADIOLOGY WITH ANESTHESIA N/A 04/11/2015   Procedure: RADIOLOGY WITH ANESTHESIA;  Surgeon: Luanne Bras, MD;  Location: [facility];  Service: Radiology;  Laterality: N/A;  . TRACHEOSTOMY    . TUBAL LIGATION Right   . VEIN SURGERY Right 2005     OB History   No obstetric history on file.     Family History  Problem Relation Age of  Onset  . Cancer Mother        Breast cancer (right)  . Cancer Maternal Aunt        Breast cancer  . Cancer Maternal Aunt        Breast cancer  . Cancer Cousin        Breast Cancer  . Diabetes Sister   . Colon cancer Neg Hx     Social History   Tobacco Use  . Smoking status: Former Smoker    Packs/day: 0.25    Quit date: 08/14/2000    Years since quitting: 20.1  . Smokeless tobacco: Never Used  Vaping Use  . Vaping Use: Never used  Substance Use Topics  . Alcohol use: Yes    Comment: Social drinker  . Drug use: No    Comment: Tried crack cocaine and marijuana in the past    Home Medications Prior to Admission medications   Medication Sig Start Date End Date Taking? Authorizing Provider  amLODipine (NORVASC) 10 MG tablet Take 1 tablet (10 mg total) by mouth daily. 07/09/15   Mikhail, Velta Addison, DO  baclofen (LIORESAL) 10 MG tablet Take 10 mg by mouth 2 (two) times daily as needed for muscle spasms. 10/01/19   [provider]  carvedilol (COREG) 12.5 MG tablet Take 1 tablet (12.5 mg total) by mouth 2 (two) times daily with a meal. 07/09/15   [name], DO  Cholecalciferol 125 MCG (5000 UT) capsule  Take 5,000 Units by mouth daily.    [provider]  ciprofloxacin (CIPRO) 250 MG tablet Take 1 tablet (250 mg total) by mouth 2 (two) times daily. 08/28/20   Robley Fries, MD  citalopram (CELEXA) 40 MG tablet Take 40 mg by mouth daily.  07/28/15   [provider]  insulin glargine (LANTUS) 100 UNIT/ML Solostar Pen Inject 10 Units into the skin daily at 10 pm. Patient taking differently: Inject 50 Units into the skin at bedtime. 02/09/20   Kathie Dike, MD  Insulin Pen Needle 32G X 4 MM MISC Use with insulin pen to dispense insulin daily 07/09/15   [name], DO  lisinopril (PRINIVIL,ZESTRIL) 10 MG tablet Take 1 tablet (10 mg total) by mouth daily. 07/09/15   Mikhail, Velta Addison, DO  pravastatin (PRAVACHOL) 40 MG tablet Take 40 mg by mouth daily. 04/28/20   [provider]  rivaroxaban (XARELTO) 20 MG TABS tablet Take 1 tablet (20 mg total) by mouth daily with supper. 07/09/15   [name], DO    Allergies    Codeine, Oxycodone, and Oxycontin [oxycodone hcl]  Review of Systems   Review of Systems  Constitutional: Negative for chills, diaphoresis, fatigue and fever.  HENT: Negative for congestion, sore throat and trouble swallowing.   Eyes: Negative for pain and visual disturbance.  Respiratory: Negative for cough, shortness of breath and wheezing.   Cardiovascular: Negative for chest pain, palpitations and leg swelling.  Gastrointestinal: Negative for abdominal distention, abdominal pain, diarrhea, nausea and vomiting.  Genitourinary: Negative for difficulty urinating.  Musculoskeletal: Negative for back pain, neck pain and neck stiffness.  Skin: Negative for pallor.  Neurological: Positive for headaches. Negative for dizziness, facial asymmetry, speech difficulty, weakness, light-headedness and numbness.  Psychiatric/Behavioral: Negative for confusion.    Physical Exam Updated Vital Signs BP 115/84   Pulse (!) 59   Temp 98.5 F (36.9 C) (Oral)    Resp 15   Ht 5\' 4"  (1.626 m)   Wt 90.7 kg   SpO2 98%   BMI  34.32 kg/m   Physical Exam Constitutional:      General: She is not in acute distress.    Appearance: Normal appearance. She is not ill-appearing, toxic-appearing or diaphoretic.     Comments: Patient able to follow commands, will constantly say I do not know, does have aphasia.  HENT:     Head: Normocephalic and atraumatic.     Comments: No temporal artery tenderness.     Mouth/Throat:     Mouth: Mucous membranes are moist.     Pharynx: Oropharynx is clear.  Eyes:     General: No scleral icterus.    Extraocular Movements: Extraocular movements intact.     Right eye: Normal extraocular motion and no nystagmus.     Left eye: Abnormal extraocular motion present. No nystagmus.     Pupils:     Right eye: Pupil is reactive.     Left eye: Pupil is reactive.     Comments: Right eye with no abnormalities.  Left eye with visual field defect, will neglect past midline on left eye.  Patient unable to answer humming fingers and holding when right eye is closed.  Cardiovascular:     Rate and Rhythm: Normal rate and regular rhythm.     Pulses: Normal pulses.     Heart sounds: Normal heart sounds.  Pulmonary:     Effort: Pulmonary effort is normal. No respiratory distress.     Breath sounds: Normal breath sounds. No stridor. No wheezing, rhonchi or rales.  Chest:     Chest wall: No tenderness.  Abdominal:     General: Abdomen is flat. There is no distension.     Palpations: Abdomen is soft.     Tenderness: There is no abdominal tenderness. There is no guarding or rebound.  Musculoskeletal:        General: No swelling or tenderness. Normal range of motion.     Cervical back: Normal range of motion and neck supple. No rigidity.     Right lower leg: No edema.     Left lower leg: No edema.  Skin:    General: Skin is warm and dry.     Capillary Refill: Capillary refill takes less than 2 seconds.     Coloration: Skin is not pale.   Neurological:     General: No focal deficit present.     Mental Status: She is alert and oriented to person, place, and time.     Comments: Alert, patient will nod.  Only words she says are I do not know, is clear.  No facial droop. CNIII-XII grossly intact compared to baseline, patient does have deficits on right side.  Is paralyzed on right side... 5/5 symmetric strength with grip strength and with plantar and dorsi flexion to left side. Normal finger to nose to left side.. Negative pronator drift to left side.  Psychiatric:        Mood and Affect: Mood normal.        Behavior: Behavior normal.     ED Results / Procedures / Treatments   Labs (all labs ordered are listed, but only abnormal results are displayed) Labs Reviewed  COMPREHENSIVE METABOLIC PANEL - Abnormal; Notable for the following components:      Result Value   Potassium 3.4 (*)    Glucose, Bld 114 (*)    Calcium 8.7 (*)    AST 12 (*)    All other components within normal limits  CBC WITH DIFFERENTIAL/PLATELET - Abnormal; Notable for the following components:  Hemoglobin 11.6 (*)    MCH 24.5 (*)    All other components within normal limits  URINALYSIS, ROUTINE W REFLEX MICROSCOPIC    EKG None  Radiology CT Head Wo Contrast  Result Date: 10/05/2020 CLINICAL DATA:  Headache, new or worsening. Additional provided: History of stroke, right-sided deficits, left-sided headache. EXAM: CT HEAD WITHOUT CONTRAST TECHNIQUE: Contiguous axial images were obtained from the base of the skull through the vertex without intravenous contrast. COMPARISON:  Prior head CT examinations 04/12/2016. Brain MRI 06/21/2015. FINDINGS: Brain: Redemonstrated very large chronic left MCA territory infarct. Ex vacuo dilatation of the left lateral ventricle. Additionally, there is wallerian degeneration on the left extending to the brainstem. New from the prior head CT of 04/12/2016, there is a heterogeneously calcified focus within the left  frontoparietal infarction territory which measures 3.1 x 1.4 cm in transaxial dimensions. This has an appearance most suggestive of dystrophic calcification. Background cerebral volume is within normal limits. There is no acute intracranial hemorrhage. No acute demarcated cortical infarct. No extra-axial fluid collection. No evidence of intracranial mass. No midline shift. Vascular: No hyperdense vessel.  Atherosclerotic calcifications. Skull: Normal. Negative for fracture or focal lesion. Sinuses/Orbits: Visualized orbits show no acute finding. Trace scattered bilateral ethmoid sinus mucosal thickening. IMPRESSION: No evidence of acute intracranial abnormality. Redemonstrated very large chronic left MCA territory infarction. New from the prior head CT of 04/12/2016, there is a 3.1 x 1.4 cm heterogeneously calcified focus within the left frontoparietal infarction territory which has an appearance most suggestive of dystrophic calcification. Mild bilateral ethmoid disease, as described. Electronically Signed   By: Kellie Simmering DO   On: 10/05/2020 16:52   MR ANGIO HEAD WO CONTRAST  Result Date: 10/05/2020 CLINICAL DATA:  Headache, new or worsening. Additional history provided: History of stroke, right-sided deficits, left-sided headache. EXAM: MRI HEAD WITHOUT CONTRAST MRA HEAD WITHOUT CONTRAST TECHNIQUE: Multiplanar, multiecho pulse sequences of the brain and surrounding structures were obtained without intravenous contrast. Angiographic images of the head were obtained using MRA technique without contrast. COMPARISON:  Prior head CT examinations 10/05/2020 and earlier. Brain MRI 06/21/2015. Report from catheter based angiography 04/11/2015. FINDINGS: MRI HEAD FINDINGS Brain: Mild intermittent motion degradation. Redemonstrated very large chronic left MCA territory infarct with associated scattered chronic hemosiderin deposition. 3.6 x 1.5 cm focus of susceptibility artifact within the left frontoparietal  infarction territory compatible with the dystrophic calcification demonstrated on the head CT performed earlier today. Associated wallerian degeneration extending into the left brainstem. Ex vacuo dilatation of the left lateral ventricle. Background mild cerebral atrophy. There is no acute infarct. No evidence of intracranial mass. No extra-axial fluid collection. No midline shift. Vascular: Signal abnormality within the left internal carotid artery at the distal cervical level and siphon level compatible with known chronic occlusion. Skull and upper cervical spine: No focal marrow lesion. Sinuses/Orbits: Visualized orbits show no acute finding. Trace left ethmoid sinus mucosal thickening. MRA HEAD FINDINGS Mildly motion degraded examination. Absence of flow related signal within the distal cervical ICA and majority of the intracranial left ICA compatible with known chronic occlusion. There is some reconstitution of flow related signal within the supraclinoid left ICA. The intracranial right ICA is patent. The M1 left middle cerebral artery is asymmetrically diminutive and irregular. Additionally, there is a marked paucity of flow related signal within the left M2 and more distal left MCA branch vessels. These findings are likely chronic given the large chronic left MCA territory infarct. The M1 right middle cerebral artery is patent.  No right M2 proximal branch occlusion or high-grade proximal stenosis. The anterior cerebral arteries are patent. The intracranial vertebral arteries are patent. The basilar artery is patent. The posterior cerebral arteries are patent. Mild/moderate stenosis within the P2 right posterior cerebral artery. Moderate stenosis within the right posterior cerebral artery at the P2/P3 junction. Apparent severe stenoses within the posterior cerebral arteries at the P3/P4 junctions bilaterally. No intracranial aneurysm is identified. IMPRESSION: MRI brain: 1. No evidence of acute intracranial  abnormality, including acute infarction. 2. Redemonstrated very large chronic left MCA territory infarct with associated scattered chronic hemosiderin deposition. 3. 3.6 x 1.5 cm focus focus of dystrophic calcification within the left frontoparietal infarction territory. 4. Wallerian degeneration extending into the left brainstem. 5. Background mild cerebral atrophy. 6. Mild left ethmoid sinus mucosal thickening. MRA head: 1. 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 There is some reconstitution of flow related signal within the supraclinoid left ICA. 2. The M1 left middle cerebral artery is diminutive and irregular. Additionally, there is a marked paucity of flow related signal within the left M2 and more distal left MCA branches. These findings are likely chronic given the patient's large chronic left MCA territory infarct. 3. Mild/moderate stenosis within the P2 right posterior cerebral artery. 4. Moderate stenosis within the right posterior cerebral artery at the P2/P3 junction. 5. Apparent severe stenoses within the posterior cerebral arteries at the P3/P4 junctions bilaterally. Electronically Signed   By: Kellie Simmering DO   On: 10/05/2020 18:16   MR Brain Wo Contrast (neuro protocol)  Result Date: 10/05/2020 CLINICAL DATA:  Headache, new or worsening. Additional history provided: History of stroke, right-sided deficits, left-sided headache. EXAM: MRI HEAD WITHOUT CONTRAST MRA HEAD WITHOUT CONTRAST TECHNIQUE: Multiplanar, multiecho pulse sequences of the brain and surrounding structures were obtained without intravenous contrast. Angiographic images of the head were obtained using MRA technique without contrast. COMPARISON:  Prior head CT examinations 10/05/2020 and earlier. Brain MRI 06/21/2015. Report from catheter based angiography 04/11/2015. FINDINGS: MRI HEAD FINDINGS Brain: Mild  intermittent motion degradation. Redemonstrated very large chronic left MCA territory infarct with associated scattered chronic hemosiderin deposition. 3.6 x 1.5 cm focus of susceptibility artifact within the left frontoparietal infarction territory compatible with the dystrophic calcification demonstrated on the head CT performed earlier today. Associated wallerian degeneration extending into the left brainstem. Ex vacuo dilatation of the left lateral ventricle. Background mild cerebral atrophy. There is no acute infarct. No evidence of intracranial mass. No extra-axial fluid collection. No midline shift. Vascular: Signal abnormality within the left internal carotid artery at the distal cervical level and siphon level compatible with known chronic occlusion. Skull and upper cervical spine: No focal marrow lesion. Sinuses/Orbits: Visualized orbits show no acute finding. Trace left ethmoid sinus mucosal thickening. MRA HEAD FINDINGS Mildly motion degraded examination. Absence of flow related signal within the distal cervical ICA and majority of the intracranial left ICA compatible with known chronic occlusion. There is some reconstitution of flow related signal within the supraclinoid left ICA. The intracranial right ICA is patent. The M1 left middle cerebral artery is asymmetrically diminutive and irregular. Additionally, there is a marked paucity of flow related signal within the left M2 and more distal left MCA branch vessels. These findings are likely chronic given the large chronic left MCA territory infarct. The M1 right middle cerebral artery is patent. No right M2 proximal branch occlusion or high-grade  proximal stenosis. The anterior cerebral arteries are patent. The intracranial vertebral arteries are patent. The basilar artery is patent. The posterior cerebral arteries are patent. Mild/moderate stenosis within the P2 right posterior cerebral artery. Moderate stenosis within the right posterior cerebral  artery at the P2/P3 junction. Apparent severe stenoses within the posterior cerebral arteries at the P3/P4 junctions bilaterally. No intracranial aneurysm is identified. IMPRESSION: MRI brain: 1. No evidence of acute intracranial abnormality, including acute infarction. 2. Redemonstrated very large chronic left MCA territory infarct with associated scattered chronic hemosiderin deposition. 3. 3.6 x 1.5 cm focus focus of dystrophic calcification within the left frontoparietal infarction territory. 4. Wallerian degeneration extending into the left brainstem. 5. Background mild cerebral atrophy. 6. Mild left ethmoid sinus mucosal thickening. MRA head: 1. 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 There is some reconstitution of flow related signal within the supraclinoid left ICA. 2. The M1 left middle cerebral artery is diminutive and irregular. Additionally, there is a marked paucity of flow related signal within the left M2 and more distal left MCA branches. These findings are likely chronic given the patient's large chronic left MCA territory infarct. 3. Mild/moderate stenosis within the P2 right posterior cerebral artery. 4. Moderate stenosis within the right posterior cerebral artery at the P2/P3 junction. 5. Apparent severe stenoses within the posterior cerebral arteries at the P3/P4 junctions bilaterally. Electronically Signed   By: Kellie Simmering DO   On: 10/05/2020 18:16    Procedures Procedures   Medications Ordered in ED Medications  sodium chloride 0.9 % bolus 1,000 mL (1,000 mLs Intravenous New Bag/Given 10/05/20 1853)  metoCLOPramide (REGLAN) injection 5 mg (5 mg Intravenous Given 10/05/20 1854)  diphenhydrAMINE (BENADRYL) injection 12.5 mg (12.5 mg Intravenous Given 10/05/20 1855)  ketorolac (TORADOL) 30 MG/ML injection 30 mg (30 mg Intravenous Given 10/05/20 1853)    ED  Course  I have reviewed the triage vital signs and the nursing notes.  Pertinent labs & imaging results that were available during my care of the patient were reviewed by me and considered in my medical decision making (see chart for details).    MDM Rules/Calculators/A&P                         Kerry [name] is a 65 y.o. 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  Patient without any new neuro symptoms, is at baseline according to son.  No signs of temporal arteritis, patient does have history of migraines.  No new visual deficits.  Cardiac  Patient does have some neglect on the left side, son states this is normal.  When I asked the patient about that she suggest that she has had this for 5 years.  She holds up 5 fingers and nods when she says that she has had this for 5 years.  Patient states that she is unable to see on this side.I do not see any evidence of visual field defect on neurology note in 2017.  Patient has not followed up with neurologist since then.  In regards to imaging, CT, MRI and MRA without any evidence of acute intracranial abnormality.  Does show stenosis on MRA of some vessels.  Patient will follow up with outpatient neurology.  We will offer migraine cocktail  and reevaluate.  Pt care was handed off to C. Couture PA-C at hand off.  Complete history and physical and current plan have been communicated.  Please refer to their note for the remainder of ED care and ultimate disposition.  Awaiting UA, EKG and migraine cocktail.  If patient feels better after migraine cocktail most likely to be discharged.  I discussed this case with my attending physician who cosigned this note including patient's presenting symptoms, physical exam, and planned diagnostics and interventions. Attending physician stated agreement with plan or made changes to plan which were  implemented.   Attending physician assessed patient at bedside.  Final Clinical Impression(s) / ED Diagnoses Final diagnoses:  Bad headache    Rx / DC Orders ED Discharge Orders    None       Alfredia Client, PA-C 10/05/20 Otis Peak, MD 10/05/20 [phone]

## 2020-10-05 NOTE — Discharge Instructions (Signed)
Please follow up with your primary care provider within 5-7 days for re-evaluation of your symptoms. If you do not have a primary care provider, information for a healthcare clinic has been provided for you to make arrangements for follow up care. Please return to the emergency department for any new or worsening symptoms. ° °

## 2020-10-05 NOTE — ED Notes (Signed)
[facility] by [facility] called

## 2021-09-04 ENCOUNTER — Inpatient Hospital Stay (HOSPITAL_COMMUNITY): Admission: RE | Admit: 2021-09-04 | Payer: Self-pay | Source: Ambulatory Visit

## 2021-09-23 ENCOUNTER — Inpatient Hospital Stay (HOSPITAL_COMMUNITY)
Admission: RE | Admit: 2021-09-23 | Discharge: 2021-09-23 | Disposition: A | Discharge status: Home / Self Care | Payer: Self-pay | Source: Ambulatory Visit

## 2021-09-24 ENCOUNTER — Encounter (HOSPITAL_BASED_OUTPATIENT_CLINIC_OR_DEPARTMENT_OTHER): Payer: 59 | Attending: General Surgery | Admitting: General Surgery

## 2021-09-24 DIAGNOSIS — E1100 Type 2 diabetes mellitus with hyperosmolarity without nonketotic hyperglycemic-hyperosmolar coma (NKHHC): Secondary | ICD-10-CM | POA: Diagnosis not present

## 2021-09-24 DIAGNOSIS — K580 Irritable bowel syndrome with diarrhea: Secondary | ICD-10-CM | POA: Diagnosis not present

## 2021-09-24 DIAGNOSIS — L304 Erythema intertrigo: Secondary | ICD-10-CM | POA: Insufficient documentation

## 2021-09-24 DIAGNOSIS — H548 Legal blindness, as defined in USA: Secondary | ICD-10-CM | POA: Diagnosis not present

## 2021-09-24 DIAGNOSIS — E11628 Type 2 diabetes mellitus with other skin complications: Secondary | ICD-10-CM | POA: Insufficient documentation

## 2021-09-24 DIAGNOSIS — G8191 Hemiplegia, unspecified affecting right dominant side: Secondary | ICD-10-CM | POA: Insufficient documentation

## 2021-09-24 DIAGNOSIS — Z930 Tracheostomy status: Secondary | ICD-10-CM | POA: Insufficient documentation

## 2021-09-24 DIAGNOSIS — Z6833 Body mass index (BMI) 33.0-33.9, adult: Secondary | ICD-10-CM | POA: Diagnosis not present

## 2021-09-24 NOTE — Progress Notes (Signed)
ARLYCE, Kerry [name] ([id number]) ?Visit Report for 09/24/2021 ?Chief Complaint Document Details ?Patient Name: Date of Service: ?PA Kerry [name], Kerry [name] 09/24/2021 1:15 PM ?Medical Record Number: [id number] ?Patient Account Number: [id number] ?Date of Birth/Sex: Treating RN: ?11-Sep-1955 (66 y.o. F) Scotton, Mechele Claude ?Primary Care Provider: Bradly Bienenstock Other Clinician: ?Referring Provider: ?Treating Provider/Extender: Fredirick Maudlin ?Bradly Bienenstock ?Weeks in Treatment: 0 ?Information Obtained from: Patient ?Chief Complaint ?Patient presents with a wound known to have fungal infection ?Electronic Signature(s) ?Signed: 09/24/2021 2:16:09 PM By: Fredirick Maudlin MD FACS ?Entered By: Fredirick Maudlin on 09/24/2021 14:16:09 ?-------------------------------------------------------------------------------- ?HPI Details ?Patient Name: Date of Service: ?PA Kerry [name], Kerry [name] 09/24/2021 1:15 PM ?Medical Record Number: [id number] ?Patient Account Number: [id number] ?Date of Birth/Sex: Treating RN: ?02/10/1956 (66 y.o. 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 She has poorly ?controlled type 2 diabetes, morbid obesity, tracheostomy dependence, irritable bowel syndrome, and aphasia. She was brought to the wound care center today ?to evaluate her extensive cutaneous candidiasis. She has 1 small wound on her right gluteus, likely secondary to shear/friction or scratching. The history is ?provided almost entirely by her son, who is a Airline pilot, as well as the electronic medical record. Apparently, she was recently on antibiotics for sinusitis and ?developed significant diarrhea. She is incontinent and  wears an adult brief. As moisture accumulated in the brief, she apparently did a lot of scratching and ?rubbing. This seems to be the source of the open wound on her gluteus. Aside from that, however, she has had a tremendous flare of yeast and her skin is ?quite inflamed. She also has yeast under her right arm, which is the flaccid limb from her stroke. Her family has been using over-the-counter topical agents as ?well as what sounds like a Monistat suppository, and trying to air her perineum out as much as they can. ?The patient is able to understand and respond appropriately to speech with yes no replies (head shaking or nodding). She indicates that she has a lot of pain ?and burning with the skin rash. Nothing really seems to alleviate this. Her family is concerned that as a diabetic, she is at higher risk of developing sepsis ?from open wounds and systemic candidiasis. ?Electronic Signature(s) ?Signed: 09/24/2021 2:22:38 PM By: Fredirick Maudlin MD FACS ?Entered By: Fredirick Maudlin on 09/24/2021 14:22:38 ?-------------------------------------------------------------------------------- ?Physical Exam Details ?Patient Name: Date of Service: ?PA Kerry [name], Kerry [name] 09/24/2021 1:15 PM ?Medical Record Number: [id number] ?Patient Account Number: [id number] ?Date of Birth/Sex: Treating RN: ?1956/01/13 (66 y.o. F) Scotton, Mechele Claude ?Primary Care Provider: Bradly Bienenstock Other Clinician: ?Referring Provider: ?Treating Provider/Extender: Fredirick Maudlin ?Bradly Bienenstock ?Weeks in Treatment: 0 ?Constitutional ?. . . . No acute distress. ?Respiratory ?Normal work of breathing on room air.Marland Kitchen ?Notes ?09/24/2021: Wound examreally there is only 1 small area of open skin on the patient's right gluteus, or looks as though she has scratched her skin open. The ?remainder of the areas of concern are moist dermatitis consistent with Candida. This extends from her inguinal folds and covers her entire perineum and gluteal ?cleft. She also has  similar lesions under her right arm. All of the skin in this area is very moist and macerated. ?Electronic Signature(s) ?Signed: 09/24/2021 2:26:07 PM By: Fredirick Maudlin MD FACS ?Entered  By: Fredirick Maudlin on 09/24/2021 14:26:07 ?-------------------------------------------------------------------------------- ?Physician Orders Details ?Patient Name: Date of Service: ?PA Kerry [name], Kerry [name] 09/24/2021 1:15 PM ?Medical Record Number: [id number] ?Patient Account Number: [id number] ?Date of Birth/Sex: Treating RN: ?January 22, 1956 (66 y.o. F) Scotton, Mechele Claude ?Primary Care Provider: Bradly Bienenstock Other Clinician: ?Referring Provider: ?Treating Provider/Extender: Fredirick Maudlin ?Bradly Bienenstock ?Weeks in Treatment: 0 ?Verbal / Phone Orders: No ?Diagnosis Coding ?ICD-10 Coding ?Code Description ?L30.4 Erythema intertrigo ?E11.622 Type 2 diabetes mellitus with other skin ulcer ?E11.628 Type 2 diabetes mellitus with other skin complications ?E11.00 Type 2 diabetes mellitus with hyperosmolarity without nonketotic hyperglycemic-hyperosmolar coma [facility]) ?E66.01 Morbid (severe) obesity due to excess calories ?X44.818 Cerebral infarction due to unspecified occlusion or stenosis of left middle cerebral artery ?Follow-up Appointments ?ppointment in 1 week. - Dr Celine Ahr ?Return A ?Bathing/ Shower/ Hygiene ?May shower and wash wound with soap and water. ?Additional Orders / Instructions ?Other: - Pick up prescription from Pharmacy ?Wound Treatment ?Wound #1 - Gluteus Wound Laterality: Left, Medial ?Cleanser: Soap and Water (Generic) 1 x Per Day/15 Days ?Discharge Instructions: May shower and wash wound with dial antibacterial soap and water prior to dressing change. ?Cleanser: Wound Cleanser (DME) (Generic) 1 x Per Day/15 Days ?Discharge Instructions: Cleanse the wound with wound cleanser prior to applying a clean dressing using gauze sponges, not tissue or cotton balls. ?Peri-Wound Care: Zinc Oxide Ointment 30g tube (Generic) 1 x Per  Day/15 Days ?Discharge Instructions: Apply Zinc Oxide to periwound with each dressing change ?Prim Dressing: KerraCel Ag Gelling Fiber Dressing, 2x2 in (silver alginate) (DME) (Generic) 1 x Per Day/15 Days ?ary ?Discharge Instructions: Apply silver alginate to wound bed as instructed ?Secondary Dressing: Woven Gauze Sponges 2x2 in (DME) (Generic) 1 x Per Day/15 Days ?Discharge Instructions: Apply over primary dressing as directed. ?Secondary Dressing: ABD Pad, 5x9 (DME) (Generic) 1 x Per Day/15 Days ?Discharge Instructions: Apply over primary dressing as directed. ?Secured With: 30M Medipore Public affairs consultant Surgical T 2x10 (in/yd) (DME) (Generic) 1 x Per Day/15 Days ?ape ?Discharge Instructions: Secure with tape as directed. ?Patient Medications ?llergies: Codeine, oxycodone ?A ?Notifications Medication Indication Start End ?09/24/2021 ?Vusion ?DOSE topical 0.25 %-15 %-81.35 % ointment - apply to all skin folds daily after bathing and drying thoroughly ?09/24/2021 ?fluconazole ?DOSE oral 150 mg tablet - 1 tab po q72 hours x 3 doses ?Electronic Signature(s) ?Signed: 09/24/2021 4:31:02 PM By: Dellie Catholic RN ?Signed: 09/24/2021 4:31:43 PM By: Fredirick Maudlin MD FACS ?Previous Signature: 09/24/2021 2:30:14 PM Version By: Fredirick Maudlin MD FACS ?Entered By: Dellie Catholic on 09/24/2021 14:33:19 ?-------------------------------------------------------------------------------- ?Problem List Details ?Patient Name: ?Date of Service: ?PA Kerry [name], Kerry [name] 09/24/2021 1:15 PM ?Medical Record Number: [id number] ?Patient Account Number: [id number] ?Date of Birth/Sex: ?Treating RN: ?[birth year] (65 y.o. F) Scotton, Mechele Claude ?Primary Care Provider: Bradly Bienenstock ?Other Clinician: ?Referring Provider: ?Treating Provider/Extender: Fredirick Maudlin ?Bradly Bienenstock ?Weeks in Treatment: 0 ?Active Problems ?ICD-10 ?Encounter ?Code Description Active Date MDM ?Diagnosis ?L30.4 Erythema intertrigo 09/24/2021 No Yes ?E11.622 Type 2 diabetes mellitus with  other skin ulcer 09/24/2021 No Yes ?E11.628 Type 2 diabetes mellitus with other skin complications 11/23/7856 No Yes ?E11.00 Type 2 diabetes mellitus with hyperosmolarity without nonketotic 09/24/2021 No Yes ?hypergly

## 2021-09-24 NOTE — Progress Notes (Signed)
Kerry [name], Kerry [name] ([id number]) ?Visit Report for 09/24/2021 ?Abuse Risk Screen Details ?Patient Name: Date of Service: ?Kerry [name], Kerry [name] 09/24/2021 1:15 PM ?Medical Record Number: [id number] ?Patient Account Number: [id number] ?Date of Birth/Sex: Treating RN: ?05/12/1956 (66 y.o. F) Scotton, Mechele Claude ?Primary Care Azusena Erlandson: Bradly Bienenstock Other Clinician: ?Referring Brenyn Petrey: ?Treating Vernon Maish/Extender: Fredirick Maudlin ?Bradly Bienenstock ?Weeks in Treatment: 0 ?Abuse Risk Screen Items ?Answer ?ABUSE RISK SCREEN: ?Has anyone close to you tried to hurt or harm you recentlyo No ?Do you feel uncomfortable with anyone in your familyo No ?Has anyone forced you do things that you didnt want to doo No ?Electronic Signature(s) ?Signed: 09/24/2021 4:31:02 PM By: Dellie Catholic RN ?Entered By: Dellie Catholic on 09/24/2021 14:07:42 ?-------------------------------------------------------------------------------- ?Activities of Daily Living Details ?Patient Name: Date of Service: ?Kerry [name], Kerry [name] 09/24/2021 1:15 PM ?Medical Record Number: [id number] ?Patient Account Number: [id number] ?Date of Birth/Sex: Treating RN: ?[birth year] (66 y.o. F) Scotton, Mechele Claude ?Primary Care Reniah Cottingham: Bradly Bienenstock Other Clinician: ?Referring Brittane Grudzinski: ?Treating Zebulon Gantt/Extender: Fredirick Maudlin ?Bradly Bienenstock ?Weeks in Treatment: 0 ?Activities of Daily Living Items ?Answer ?Activities of Daily Living (Please select one for each item) ?Drive Automobile Not Able ?T Medications ?ake Not Able ?Use T elephone Not Able ?Care for Appearance Not Able ?Use T oilet Not Able ?Bath / Shower Not Able ?Dress Self Not Able ?Feed Self Not Able ?Walk Not Able ?Get In / Out Bed Not Able ?Housework Not Able ?Prepare Meals Not Able ?Handle Money Not Able ?Shop for Self Not Able ?Electronic Signature(s) ?Signed: 09/24/2021 4:31:02 PM By: Dellie Catholic RN ?Entered By: Dellie Catholic on 09/24/2021  14:08:36 ?-------------------------------------------------------------------------------- ?Education Screening Details ?Patient Name: ?Date of Service: ?Kerry [name], Kerry [name] 09/24/2021 1:15 PM ?Medical Record Number: [id number] ?Patient Account Number: [id number] ?Date of Birth/Sex: ?Treating RN: ?06/21/1956 (66 y.o. F) Scotton, Mechele Claude ?Primary Care Shatonia Hoots: Bradly Bienenstock ?Other Clinician: ?Referring Lillan Mccreadie: ?Treating Eames Dibiasio/Extender: Fredirick Maudlin ?Bradly Bienenstock ?Weeks in Treatment: 0 ?Primary Learner Assessed: Caregiver ?Learning Preferences/Education Level/Primary Language ?Learning Preference: Explanation, Demonstration, Printed Material ?Highest Education Level: High School ?Preferred Language: English ?Cognitive Barrier ?Language Barrier: No ?Translator Needed: No ?Memory Deficit: No ?Emotional Barrier: No ?Cultural/Religious Beliefs Affecting Medical Care: No ?Physical Barrier ?Impaired Vision: Yes Legally Blind ?Impaired Hearing: No ?Decreased Hand dexterity: Yes ?Electronic Signature(s) ?Signed: 09/24/2021 4:31:02 PM By: Dellie Catholic RN ?Entered By: Dellie Catholic on 09/24/2021 14:09:17 ?-------------------------------------------------------------------------------- ?Fall Risk Assessment Details ?Patient Name: ?Date of Service: ?Kerry [name], Kerry [name] 09/24/2021 1:15 PM ?Medical Record Number: [id number] ?Patient Account Number: [id number] ?Date of Birth/Sex: ?Treating RN: ?02-Mar-1956 (66 y.o. F) Scotton, Mechele Claude ?Primary Care Keesha Pellum: Bradly Bienenstock ?Other Clinician: ?Referring Numair Masden: ?Treating Han Vejar/Extender: Fredirick Maudlin ?Bradly Bienenstock ?Weeks in Treatment: 0 ?Fall Risk Assessment Items ?Have you had 2 or more falls in the last 12 monthso 0 No ?Have you had any fall that resulted in injury in the last 12 monthso 0 No ?FALLS RISK SCREEN ?History of falling - immediate or within 3 months 0 No ?Secondary diagnosis (Do you have 2 or more medical diagnoseso) 0 No ?Ambulatory aid ?None/bed  rest/wheelchair/nurse 0 No ?Crutches/cane/walker 0 No ?Furniture 0 No ?Intravenous therapy Access/Saline/Heparin Lock 0 No ?Gait/Transferring ?Normal/ bed rest/ wheelchair 0 No ?Weak (short steps with or without shuffle, stooped but able to lift head while walking, may seek 0 No ?support from furniture) ?Impaired (short steps with shuffle, may have difficulty arising from chair, head down, impaired 0 No ?balance) ?Mental Status ?Oriented to own ability 0 No ?Electronic Signature(s) ?Signed: 09/24/2021 4:31:02  PM By: Dellie Catholic RN ?Entered By: Dellie Catholic on 09/24/2021 14:09:35 ?-------------------------------------------------------------------------------- ?Foot Assessment Details ?Patient Name: ?Date of Service: ?Kerry [name], Kerry [name] 09/24/2021 1:15 PM ?Medical Record Number: [id number] ?Patient Account Number: [id number] ?Date of Birth/Sex: ?Treating RN: ?07/05/1955 (66 y.o. F) Scotton, Mechele Claude ?Primary Care Kaydince Towles: Bradly Bienenstock ?Other Clinician: ?Referring Latwan Luchsinger: ?Treating Bronte Sabado/Extender: Fredirick Maudlin ?Bradly Bienenstock ?Weeks in Treatment: 0 ?Foot Assessment Items ?Site Locations ?+ = Sensation present, - = Sensation absent, C = Callus, U = Ulcer ?R = Redness, W = Warmth, M = Maceration, PU = Pre-ulcerative lesion ?F = Fissure, S = Swelling, D = Dryness ?Assessment ?Right: Left: ?Other Deformity: No No ?Prior Foot Ulcer: No No ?Prior Amputation: No No ?Charcot Joint: No No ?Ambulatory Status: Non-ambulatory ?Assistance Device: Wheelchair ?Gait: Unsteady ?Electronic Signature(s) ?Signed: 09/24/2021 4:31:02 PM By: Dellie Catholic RN ?Entered By: Dellie Catholic on 09/24/2021 14:09:53 ?-------------------------------------------------------------------------------- ?Nutrition Risk Screening Details ?Patient Name: ?Date of Service: ?Kerry [name], Kerry [name] 09/24/2021 1:15 PM ?Medical Record Number: [id number] ?Patient Account Number: [id number] ?Date of Birth/Sex: ?Treating RN: ?September 23, 1955 (66 y.o. F)  Scotton, Mechele Claude ?Primary Care Akelia Husted: Bradly Bienenstock ?Other Clinician: ?Referring Elray Dains: ?Treating Rett Stehlik/Extender: Fredirick Maudlin ?Bradly Bienenstock ?Weeks in Treatment: 0 ?Height (in): 67 ?Weight (lbs): 215 ?Body Mass Index (BMI): 33.7 ?Nutrition Risk Screening Items ?Score Screening ?NUTRITION RISK SCREEN: ?I have an illness or condition that made me change the kind and/or amount of food I eat 0 No ?I eat fewer than two meals per day 0 No ?I eat few fruits and vegetables, or milk products 0 No ?I have three or more drinks of beer, liquor or wine almost every day 0 No ?I have tooth or mouth problems that make it hard for me to eat 0 No ?I don't always have enough money to buy the food I need 0 No ?I eat alone most of the time 0 No ?I take three or more different prescribed or over-the-counter drugs a day 0 No ?Without wanting to, I have lost or gained 10 pounds in the last six months 0 No ?I am not always physically able to shop, cook and/or feed myself 0 No ?Nutrition Protocols ?Good Risk Protocol 0 No interventions needed ?Moderate Risk Protocol ?High Risk Proctocol ?Risk Level: Good Risk ?Score: 0 ?Electronic Signature(s) ?Signed: 09/24/2021 4:31:02 PM By: Dellie Catholic RN ?Entered By: Dellie Catholic on 09/24/2021 14:09:46 ?

## 2021-09-24 NOTE — Progress Notes (Signed)
Kerry [name] ([id number]) ?Visit Report for 09/24/2021 ?Allergy List Details ?Patient Name: Date of Service: ?Kerry [name], Kerry [name] 09/24/2021 1:15 PM ?Medical Record Number: [id number] ?Patient Account Number: [id number] ?Date of Birth/Sex: Treating RN: ?[birth year] (66 y.o. F) Scotton, Mechele Claude ?Primary Care Gay Rape: Bradly Bienenstock Other Clinician: ?Referring Viyaan Champine: ?Treating Cecila Satcher/Extender: Fredirick Maudlin ?Bradly Bienenstock ?Weeks in Treatment: 0 ?Allergies ?Active Allergies ?Codeine ?Severity: Severe ?Type: Medication ?oxycodone ?Allergy Notes ?Electronic Signature(s) ?Signed: 09/24/2021 4:31:02 PM By: Dellie Catholic RN ?Entered By: Dellie Catholic on 09/24/2021 13:42:02 ?-------------------------------------------------------------------------------- ?Arrival Information Details ?Patient Name: Date of Service: ?Kerry [name], Kerry [name] 09/24/2021 1:15 PM ?Medical Record Number: [id number] ?Patient Account Number: [id number] ?Date of Birth/Sex: Treating RN: ?[birth year] (66 y.o. F) Scotton, Mechele Claude ?Primary Care Vitalia Stough: Bradly Bienenstock Other Clinician: ?Referring Vincy Feliz: ?Treating Pantera Winterrowd/Extender: Fredirick Maudlin ?Bradly Bienenstock ?Weeks in Treatment: 0 ?Visit Information ?Patient Arrived: Wheel Chair ?Arrival Time: 13:32 ?Accompanied By: family ?Transfer Assistance: Civil Service fast streamer ?Patient Identification Verified: Yes ?Secondary Verification Process Completed: Yes ?Patient Requires Transmission-Based Precautions: No ?Patient Has Alerts: Yes ?Patient Alerts: Patient on Blood Thinner ?xarelto ?Electronic Signature(s) ?Signed: 09/24/2021 4:31:02 PM By: Dellie Catholic RN ?Entered By: Dellie Catholic on 09/24/2021 13:38:02 ?-------------------------------------------------------------------------------- ?Clinic Level of Care Assessment Details ?Patient Name: Date of Service: ?Kerry [name], Kerry [name] 09/24/2021 1:15 PM ?Medical Record Number: [id number] ?Patient Account Number: [id number] ?Date of Birth/Sex: Treating  RN: ?Feb 25, 1956 (66 y.o. F) Scotton, Mechele Claude ?Primary Care Torianna Junio: Bradly Bienenstock Other Clinician: ?Referring Theus Espin: ?Treating Thos Matsumoto/Extender: Fredirick Maudlin ?Bradly Bienenstock ?Weeks in Treatment: 0 ?Clinic Level of Care Assessment Items ?TOOL 2 Quantity Score ?X- 1 0 ?Use when only an EandM is performed on the INITIAL visit ?ASSESSMENTS - Nursing Assessment / Reassessment ?X- 1 20 ?General Physical Exam (combine w/ comprehensive assessment (listed just below) when performed on new pt. evals) ?X- 1 25 ?Comprehensive Assessment (HX, ROS, Risk Assessments, Wounds Hx, etc.) ?ASSESSMENTS - Wound and Skin A ssessment / Reassessment ?X - Simple Wound Assessment / Reassessment - one wound 1 5 ?'[]'$  - 0 ?Complex Wound Assessment / Reassessment - multiple wounds ?'[]'$  - 0 ?Dermatologic / Skin Assessment (not related to wound area) ?ASSESSMENTS - Ostomy and/or Continence Assessment and Care ?X- 1 10 ?Incontinence Assessment and Management ?'[]'$  - 0 ?Ostomy Care Assessment and Management (repouching, etc.) ?PROCESS - Coordination of Care ?'[]'$  - 0 ?Simple Patient / Family Education for ongoing care ?X- 1 20 ?Complex (extensive) Patient / Family Education for ongoing care ?X- 1 10 ?Staff obtains Consents, Records, T Results / Process Orders ?est ?X- 1 10 ?Staff telephones HHA, Nursing Homes / Clarify orders / etc ?'[]'$  - 0 ?Routine Transfer to another Facility (non-emergent condition) ?'[]'$  - 0 ?Routine Hospital Admission (non-emergent condition) ?X- 1 15 ?New Admissions / Biomedical engineer / Ordering NPWT Apligraf, etc. ?, ?'[]'$  - 0 ?Emergency Hospital Admission (emergent condition) ?X- 1 10 ?Simple Discharge Coordination ?'[]'$  - 0 ?Complex (extensive) Discharge Coordination ?PROCESS - Special Needs ?'[]'$  - 0 ?Pediatric / Minor Patient Management ?'[]'$  - 0 ?Isolation Patient Management ?'[]'$  - 0 ?Hearing / Language / Visual special needs ?'[]'$  - 0 ?Assessment of Community assistance (transportation, D/C planning, etc.) ?'[]'$  - 0 ?Additional  assistance / Altered mentation ?'[]'$  - 0 ?Support Surface(s) Assessment (bed, cushion, seat, etc.) ?INTERVENTIONS - Wound Cleansing / Measurement ?X- 1 5 ?Wound Imaging (photographs - any number of wounds) ?'[]'$  - 0 ?Wound Tracing (instead of photographs) ?X- 1 5 ?Simple Wound Measurement - one wound ?'[]'$  - 0 ?Complex Wound Measurement - multiple wounds ?  X- 1 5 ?Simple Wound Cleansing - one wound ?'[]'$  - 0 ?Complex Wound Cleansing - multiple wounds ?INTERVENTIONS - Wound Dressings ?X - Small Wound Dressing one or multiple wounds 1 10 ?'[]'$  - 0 ?Medium Wound Dressing one or multiple wounds ?'[]'$  - 0 ?Large Wound Dressing one or multiple wounds ?'[]'$  - 0 ?Application of Medications - injection ?INTERVENTIONS - Miscellaneous ?'[]'$  - 0 ?External ear exam ?'[]'$  - 0 ?Specimen Collection (cultures, biopsies, blood, body fluids, etc.) ?'[]'$  - 0 ?Specimen(s) / Culture(s) sent or taken to Lab for analysis ?X- 1 10 ?Patient Transfer (multiple staff / Civil Service fast streamer / Similar devices) ?'[]'$  - 0 ?Simple Staple / Suture removal (25 or less) ?'[]'$  - 0 ?Complex Staple / Suture removal (26 or more) ?'[]'$  - 0 ?Hypo / Hyperglycemic Management (close monitor of Blood Glucose) ?'[]'$  - 0 ?Ankle / Brachial Index (ABI) - do not check if billed separately ?Has the patient been seen at the hospital within the last three years: Yes ?Total Score: 160 ?Level Of Care: New/Established - Level 5 ?Electronic Signature(s) ?Signed: 09/24/2021 4:31:02 PM By: Dellie Catholic RN ?Entered By: Dellie Catholic on 09/24/2021 16:28:25 ?-------------------------------------------------------------------------------- ?Encounter Discharge Information Details ?Patient Name: Date of Service: ?Kerry [name], Kerry [name] 09/24/2021 1:15 PM ?Medical Record Number: [id number] ?Patient Account Number: [id number] ?Date of Birth/Sex: Treating RN: ?1956-05-31 (66 y.o. F) Scotton, Mechele Claude ?Primary Care Rache Klimaszewski: Bradly Bienenstock Other Clinician: ?Referring Future Yeldell: ?Treating Keesha Pellum/Extender: Fredirick Maudlin ?Bradly Bienenstock ?Weeks in Treatment: 0 ?Encounter Discharge Information Items ?Discharge Condition: Stable ?Ambulatory Status: Wheelchair ?Discharge Destination: Home ?Transportation: Private Auto ?Accompanied By: sons ?Schedule Follow-up Appointment: Yes ?Clinical Summary of Care: Patient Declined ?Electronic Signature(s) ?Signed: 09/24/2021 4:31:02 PM By: Dellie Catholic RN ?Entered By: Dellie Catholic on 09/24/2021 16:29:37 ?-------------------------------------------------------------------------------- ?Lower Extremity Assessment Details ?Patient Name: Date of Service: ?Kerry [name], Kerry [name] 09/24/2021 1:15 PM ?Medical Record Number: [id number] ?Patient Account Number: [id number] ?Date of Birth/Sex: ?Treating RN: ?[birth year] (66 y.o. F) Scotton, Mechele Claude ?Primary Care Alexina Niccoli: Bradly Bienenstock ?Other Clinician: ?Referring Bayard More: ?Treating Willow Shidler/Extender: Fredirick Maudlin ?Bradly Bienenstock ?Weeks in Treatment: 0 ?Electronic Signature(s) ?Signed: 09/24/2021 4:31:02 PM By: Dellie Catholic RN ?Entered By: Dellie Catholic on 09/24/2021 13:42:31 ?-------------------------------------------------------------------------------- ?Multi Wound Chart Details ?Patient Name: ?Date of Service: ?Kerry [name], Kerry [name] 09/24/2021 1:15 PM ?Medical Record Number: [id number] ?Patient Account Number: [id number] ?Date of Birth/Sex: ?Treating RN: ?1956-06-20 (66 y.o. F) Scotton, Mechele Claude ?Primary Care Jomayra Novitsky: Bradly Bienenstock ?Other Clinician: ?Referring Demoni Gergen: ?Treating Zachry Hopfensperger/Extender: Fredirick Maudlin ?Bradly Bienenstock ?Weeks in Treatment: 0 ?Vital Signs ?Height(in): 67 ?Capillary Blood Glucose(mg/dl): 200 ?Weight(lbs): 215 ?Pulse(bpm): 78 ?Body Mass Index(BMI): 33.7 ?Blood Pressure(mmHg): 128/83 ?Temperature(??F): 98.3 ?Respiratory Rate(breaths/min): 18 ?Photos: [N/A:N/A] ?Right, Medial Gluteus N/A N/A ?Wound Location: ?Shear/Friction N/A N/A ?Wounding Event: ?Fungal N/A N/A ?Primary Etiology: ?09/04/2021 N/A N/A ?Date Acquired: ?0 N/A  N/A ?Weeks of Treatment: ?Open N/A N/A ?Wound Status: ?No N/A N/A ?Wound Recurrence: ?0.9x2.1x0.1 N/A N/A ?Measurements L x W x D (cm) ?1.484 N/A N/A ?A (cm?) : ?rea ?0.148 N/A N/A ?Volume (cm?) : ?0.00% N/A N/A ?% Reduction

## 2021-10-01 NOTE — Progress Notes (Signed)
? ? ? ?10/02/2021 ?Kerry [name] ?[id number] ?06-03-1956 ? ? ?ASSESSMENT AND PLAN:  ? ?Chronic diarrhea in patient with chronic mobility issues secondary to CVA and hemiparesis, tracheostomy ?Has never had colonoscopy ?Long discussion with the patient and her son in regards to her abnormal CT scan, and possible evaluation with colonoscopy versus flex sigmoidoscopy. ?Discussed prep that would be needed including possibility of just enemas for flex sig anoscopy. ?Patient has urinary and bowel incontinence, and at this time patient declines colonoscopy or flex sigmoidoscopy due to comorbid conditions and health. ?We did discuss with abnormality on CT the evaluation would be for potential carcinoma, even if this was found patient states she would decline treatment, not a surgical candidate and would decline chemotherapy which is understandable. ?Instructed patient if she changes her mind to please call our office back and we can schedule.  ?With patient's repeated antibiotics for UTIs will recheck C. difficile since it was over a year ago. ?With possible oily stools and long-term diabetic use we will check for pancreatic elastase. ?With patient's immobility and repeated diarrhea, will recheck abdominal x-ray for stool burden as I am unable to bring up the pictures to these images to view myself.  ?Could consider trial of bile acid sequestrant, but patient does do well with Imodium. ?Suggested adding on fiber supplement. ?-     DG Abd 1 View; Future ?-     Pancreatic elastase, fecal; Future ?-     Clostridium difficile Toxin B, Qualitative, Real-Time PCR; Future ? ?Hemiparesis affecting right side as late effect of stroke ([place]) ?Tracheostomy dependence ([place]) ?Morbid obesity due to excess calories ([place]) ? ?Patient Care Team: ?Jettie Booze, NP as PCP - General (Family Medicine) ?Rourk, Cristopher Estimable, MD as Consulting Physician (Gastroenterology) ? ?HISTORY OF PRESENT ILLNESS: ?66 y.o. female referred by Jettie Booze,  NP, with a past medical history of left-sided stroke with residual right-sided deficits in wheel chair, prior PE/DVT on Xarelto, coronary artery disease, hypertension, type 2 diabetes, tracheostomy and others listed below presents for evaluation of diarrhea.  ? ?Reviewed notes from [facility], patient last saw them 04/02/2020 for chronic diarrhea.  ? At that time had negative C. difficile, GI pathogen positive for E. coli, treated for azithromycin. ?Negative celiac panel. ?CT abdomen pelvis August 2021 showed mild prominence of the sigmoid colon. ?Patient was scheduled for colonoscopy at [place] at [facility] however anesthesiology canceled due to concerns for airway management. ?Patient sent here for hospital-based colonoscopy per Dr. Loletha Carrow that reviewed results.  ? ?Has never had colonoscopy. ?No family history of colon cancer.  ?Son is here and provides a significant portion of the history.  ?Has had chronic diarrhea 20 years + ?She has been diabetic for 10 years, has been staying 150-300.  ?Last A1C was 7.4 02/28/2021 ? ?She has diarrhea daily, will have fecal and urinary incontinence, wears adult depends. Can have stools 1-4 x a day.  ?She is on imodium almost daily, does help with stools.  ?Jello consistency to water, no blood. No black stools. Can seem oily.  ?Can be after eating.  ?No fever, chills. Some periumblical AB pain.  ?No GERd, nausea, vomiting.  ?Treated recently for sinusitis with ABX Feb 25, husband passed Feb 13th. ?Has had 2 ABX within last year.  ?No weight loss per patient/son but can not get on scale.  ? ?External labs and notes reviewed this visit: ?Abdominal 1 view 02/05/2020: Gaseous distended loops of small bowel throughout  the abdomen. Colonic distention. CT advised. ?  ?CT abdomen pelvis with contrast 02/05/2020: Redundant sigmoid colon. Mild prominence of the sigmoid colon measuring 6.2 cm in the mid abdomen right of midline. Right-sided  nephrolithiasis. 2 adjacent stones over the distal right ureter at and just above the UPJ. Low-grade obstruction with mild right-sided hydronephrosis. Stable 7.1 cm simple appearing left ovarian cyst recommend pelvic ultrasound. Possible minimal layering sludge versus noncalcified gallstones. Mild cardiomegaly with small pericardial effusion, small bilateral pleural effusions, possible 1.3 cm right adrenal nodule likely adenoma, recommend adrenal CT 1 year. ? ?06/2015 MBSS done during hospitalization for CVA complicated by aspiration pna: dysphagia 3 solids with nectar thick liquid. She has not been using thickening agents in several years. ? ? ?Current Medications:  ? ?Current Outpatient Medications (Endocrine & Metabolic):  ?  LEVEMIR FLEXPEN 100 UNIT/ML FlexPen, Inject into the skin. ? ?Current Outpatient Medications (Cardiovascular):  ?  amLODipine (NORVASC) 10 MG tablet, Take 1 tablet (10 mg total) by mouth daily. ?  carvedilol (COREG) 12.5 MG tablet, Take 1 tablet (12.5 mg total) by mouth 2 (two) times daily with a meal. ?  lisinopril (PRINIVIL,ZESTRIL) 10 MG tablet, Take 1 tablet (10 mg total) by mouth daily. ?  pravastatin (PRAVACHOL) 40 MG tablet, Take 40 mg by mouth daily. ? ? ? ?Current Outpatient Medications (Hematological):  ?  rivaroxaban (XARELTO) 20 MG TABS tablet, Take 1 tablet (20 mg total) by mouth daily with supper. ? ?Current Outpatient Medications (Other):  ?  baclofen (LIORESAL) 10 MG tablet, Take 10 mg by mouth 2 (two) times daily as needed for muscle spasms. ?  Cholecalciferol 125 MCG (5000 UT) capsule, Take 5,000 Units by mouth daily. ?  citalopram (CELEXA) 40 MG tablet, Take 40 mg by mouth daily.  ?  Insulin Pen Needle 32G X 4 MM MISC, Use with insulin pen to dispense insulin daily ? ?Medical History:  ?Past Medical History:  ?Diagnosis Date  ? Breast cancer ([place]) 03/10/2007  ? Left breat  ? Carpal tunnel syndrome on right   ? Coronary artery disease   ? Degenerative disc disease, cervical    ? Degenerative disc disease, lumbar   ? Diabetes mellitus   ? Elevated troponin   ? Fibromyalgia   ? GIB (gastrointestinal bleeding)   ? HTN (hypertension) 06/13/2015  ? Hyperlipidemia   ? Hypertension   ? PE (pulmonary embolism)   ? Stridor   ? due to vocal cord paresis / hypomobility as seen on laryngoscopy 06/08/15.  ? Stroke [facility])   ? paralyzed on right side  ? UTI (lower urinary tract infection)   ? ?Allergies:  ?Allergies  ?Allergen Reactions  ? Codeine Nausea Only  ?  Nausea?-- "made her sick"  ? Oxycodone Nausea Only  ?  Sick   ? Oxycontin [Oxycodone Hcl] Nausea Only  ?  Sick   ?  ? ?Surgical History:  ?She  has a past surgical history that includes Cardiac surgery; Appendectomy; Breast surgery; Mastectomy (Left, 03/2007); Cardiac catheterization (2005); Vein Surgery (Right, 2005); Tubal ligation (Right); Radiology with anesthesia (N/A, 04/11/2015); Cardiac catheterization (N/A, 04/13/2015); PEG placement (N/A, 04/18/2015); Esophagogastroduodenoscopy (N/A, 04/18/2015); IR GASTROSTOMY TUBE REMOVAL/REPAIR (11/05/2016); Cystoscopy w/ ureteral stent placement (Right, 02/06/2020); Tracheostomy; Cystoscopy with retrograde pyelogram, ureteroscopy and stent placement (Right, 08/28/2020); and Holmium laser application (Right, 02/26/2840). ?Family History:  ?Her family history includes Cancer in her cousin, maternal aunt, maternal aunt, and mother; Diabetes in her sister. ?Social History:  ? reports that she quit smoking about  21 years ago. She smoked an average of .25 packs per day. She has never used smokeless tobacco. She reports that she does not currently use alcohol. She reports that she does not use drugs. ? ?REVIEW OF SYSTEMS  : All other systems reviewed and negative except where noted in the History of Present Illness. ? ? ?PHYSICAL EXAM: ?BP 124/68   Pulse 83   SpO2 95%  ?General:  chronically ill appearing obese female ?Eyes: sclerae anicteric,conjunctive pink  ?Heart:  regular rate and rhythm ?Pulm:  Tracheostomy in place, has diffuse wheezing bilateral lobes, no rales or rhonchi. ?Abdomen:   Soft, Obese AB, Active bowel sounds. No tenderness . , No organomegaly appreciated appreciated on exam.  Patient did have vent

## 2021-10-02 ENCOUNTER — Encounter: Payer: Self-pay | Admitting: Physician Assistant

## 2021-10-02 ENCOUNTER — Ambulatory Visit: Payer: 59 | Admitting: Physician Assistant

## 2021-10-02 ENCOUNTER — Other Ambulatory Visit: Payer: 59

## 2021-10-02 ENCOUNTER — Ambulatory Visit (INDEPENDENT_AMBULATORY_CARE_PROVIDER_SITE_OTHER)
Admission: RE | Admit: 2021-10-02 | Discharge: 2021-10-02 | Disposition: A | Discharge status: Home / Self Care | Payer: 59 | Source: Ambulatory Visit | Attending: Physician Assistant | Admitting: Physician Assistant

## 2021-10-02 ENCOUNTER — Encounter (HOSPITAL_BASED_OUTPATIENT_CLINIC_OR_DEPARTMENT_OTHER): Payer: 59 | Admitting: General Surgery

## 2021-10-02 VITALS — BP 124/68 | HR 83

## 2021-10-02 DIAGNOSIS — I69351 Hemiplegia and hemiparesis following cerebral infarction affecting right dominant side: Secondary | ICD-10-CM

## 2021-10-02 DIAGNOSIS — Z930 Tracheostomy status: Secondary | ICD-10-CM

## 2021-10-02 DIAGNOSIS — R933 Abnormal findings on diagnostic imaging of other parts of digestive tract: Secondary | ICD-10-CM

## 2021-10-02 DIAGNOSIS — K529 Noninfective gastroenteritis and colitis, unspecified: Secondary | ICD-10-CM | POA: Diagnosis not present

## 2021-10-02 NOTE — Progress Notes (Signed)
Kerry [name] ([id number]) ?Visit Report for 10/02/2021 ?Chief Complaint Document Details ?Patient Name: Date of Service: ?Kerry [name]. 10/02/2021 9:15 A M ?Medical Record Number: [id number] ?Patient Account Number: [id number] ?Date of Birth/Sex: Treating RN: ?1955/11/17 (66 y.o. F) Scotton, Mechele Claude ?Primary Care Provider: Bradly Bienenstock Other Clinician: ?Referring Provider: ?Treating Provider/Extender: Fredirick Maudlin ?Bradly Bienenstock ?Weeks in Treatment: 1 ?Information Obtained from: Patient ?Chief Complaint ?Patient presents with a wound known to have fungal infection ?Electronic Signature(s) ?Signed: 10/02/2021 9:57:13 AM By: Fredirick Maudlin MD FACS ?Entered By: Fredirick Maudlin on 10/02/2021 09:57:13 ?-------------------------------------------------------------------------------- ?HPI Details ?Patient Name: Date of Service: ?Kerry [name], Kerry [name]. 10/02/2021 9:15 A M ?Medical Record Number: [id number] ?Patient Account Number: [id number] ?Date of Birth/Sex: Treating RN: ?[birth year] (66 y.o. 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 She has poorly ?controlled type 2 diabetes, morbid obesity, tracheostomy dependence, irritable bowel syndrome, and aphasia. She was brought to the wound care center today ?to evaluate her extensive cutaneous candidiasis. She has 1 small wound on her right gluteus, likely secondary to shear/friction or scratching. The history is ?provided almost entirely by her son, who is a Airline pilot, as well as the electronic medical record. Apparently, she was recently on antibiotics for sinusitis and ?developed significant diarrhea. She is incontinent  and wears an adult brief. As moisture accumulated in the brief, she apparently did a lot of scratching and ?rubbing. This seems to be the source of the open wound on her gluteus. Aside from that, however, she has had a tremendous flare of yeast and her skin is ?quite inflamed. She also has yeast under her right arm, which is the flaccid limb from her stroke. Her family has been using over-the-counter topical agents as ?well as what sounds like a Monistat suppository, and trying to air her perineum out as much as they can. ?The patient is able to understand and respond appropriately to speech with yes no replies (head shaking or nodding). She indicates that she has a lot of pain ?and burning with the skin rash. Nothing really seems to alleviate this. Her family is concerned that as a diabetic, she is at higher risk of developing sepsis ?from open wounds and systemic candidiasis. ?10/02/2021: The prescribed ointment that I had recommended last week was too expensive and was not covered by the patient's insurance. I suggested that her ?son mix miconazole and zinc oxide together and use this as a substitute. He has been doing so. They have also been letting her air out after her baths, rather ?than keeping her in a diaper at all times. This seems to have greatly improved the intertriginous candidiasis. The wound on her posterior right thigh/buttock is ?smaller today and the periwound skin is intact. All of her skin appears far less red and angry than it did last week and the moisture balance is significantly ?improved. ?Electronic Signature(s) ?Signed: 10/02/2021 10:02:19 AM By: Fredirick Maudlin MD FACS ?Previous Signature: 10/02/2021 9:58:43 AM Version By: Fredirick Maudlin MD FACS ?Entered By: Fredirick Maudlin on 10/02/2021 10:02:19 ?-------------------------------------------------------------------------------- ?Physical Exam Details ?Patient Name: Date of Service: ?Kerry [name], Kerry [name]. 10/02/2021 9:15 A M ?Medical  Record Number: [id number] ?Patient Account Number: [id number] ?Date of Birth/Sex: Treating RN: ?[birth year] (66 y.o. F)  Scotton, Mechele Claude ?Primary Care Provider: Bradly Bienenstock Other Clinician: ?Referring Provider: ?Treating Provider/Extender: Fredirick Maudlin ?Bradly Bienenstock ?Weeks in Treatment: 1 ?Constitutional ?. . . . No acute distress. ?Respiratory ?Normal work of breathing on room air via tracheostomy. ?Notes ?10/02/2021: The skin folds are markedly improved with decreased redness and irritation. The patient indicates that they are less painful and itchy, as well. ?Moisture balance is greatly improved. The small wound on her right gluteus/upper thigh is smaller and the periwound skin is intact. There is no slough or eschar ?buildup. ?Electronic Signature(s) ?Signed: 10/02/2021 10:00:23 AM By: Fredirick Maudlin MD FACS ?Entered By: Fredirick Maudlin on 10/02/2021 10:00:23 ?-------------------------------------------------------------------------------- ?Physician Orders Details ?Patient Name: Date of Service: ?Kerry [name], Kerry [name]. 10/02/2021 9:15 A M ?Medical Record Number: [id number] ?Patient Account Number: [id number] ?Date of Birth/Sex: Treating RN: ?Jun 19, 1956 (66 y.o. F) Scotton, Mechele Claude ?Primary Care Provider: Bradly Bienenstock Other Clinician: ?Referring Provider: ?Treating Provider/Extender: Fredirick Maudlin ?Bradly Bienenstock ?Weeks in Treatment: 1 ?Verbal / Phone Orders: No ?Diagnosis Coding ?ICD-10 Coding ?Code Description ?L30.4 Erythema intertrigo ?E11.622 Type 2 diabetes mellitus with other skin ulcer ?E11.628 Type 2 diabetes mellitus with other skin complications ?E11.00 Type 2 diabetes mellitus with hyperosmolarity without nonketotic hyperglycemic-hyperosmolar coma [facility]) ?E66.01 Morbid (severe) obesity due to excess calories ?I29.798 Cerebral infarction due to unspecified occlusion or stenosis of left middle cerebral artery ?Follow-up Appointments ?ppointment in 2 weeks. - **** HOY[facility]***** Dr Celine Ahr ?Return  A ?Bathing/ Shower/ Hygiene ?May shower and wash wound with soap and water. ?Additional Orders / Instructions ?Other: - Pick up prescription from Pharmacy ?Wound Treatment ?Wound #1 - Gluteus Wound Laterality: Left, Medial ?Cleanser: Soap and Water (Generic) 1 x Per Day/15 Days ?Discharge Instructions: May shower and wash wound with dial antibacterial soap and water prior to dressing change. ?Cleanser: Wound Cleanser (Generic) 1 x Per Day/15 Days ?Discharge Instructions: Cleanse the wound with wound cleanser prior to applying a clean dressing using gauze sponges, not tissue or cotton balls. ?Peri-Wound Care: Zinc Oxide Ointment 30g tube (Generic) 1 x Per Day/15 Days ?Discharge Instructions: Apply Zinc Oxide to periwound with each dressing change ?Prim Dressing: KerraCel Ag Gelling Fiber Dressing, 2x2 in (silver alginate) (Generic) 1 x Per Day/15 Days ?ary ?Discharge Instructions: Apply silver alginate to wound bed as instructed ?Secondary Dressing: Woven Gauze Sponges 2x2 in (Generic) 1 x Per Day/15 Days ?Discharge Instructions: Apply over primary dressing as directed. ?Secondary Dressing: ABD Pad, 5x9 (Generic) 1 x Per Day/15 Days ?Discharge Instructions: Apply over primary dressing as directed. ?Secured With: 36M Medipore Public affairs consultant Surgical T 2x10 (in/yd) (Generic) 1 x Per Day/15 Days ?ape ?Discharge Instructions: Secure with tape as directed. ?Electronic Signature(s) ?Signed: 10/02/2021 10:00:54 AM By: Fredirick Maudlin MD FACS ?Entered By: Fredirick Maudlin on 10/02/2021 10:00:53 ?-------------------------------------------------------------------------------- ?Problem List Details ?Patient Name: ?Date of Service: ?Kerry [name], Kerry [name]. 10/02/2021 9:15 A M ?Medical Record Number: [id number] ?Patient Account Number: [id number] ?Date of Birth/Sex: ?Treating RN: ?1956-03-22 (66 y.o. F) Scotton, Mechele Claude ?Primary Care Provider: Bradly Bienenstock ?Other Clinician: ?Referring Provider: ?Treating Provider/Extender: Fredirick Maudlin ?Bradly Bienenstock ?Weeks in Treatment: 1 ?Active Problems ?ICD-10 ?Encounter ?Code Description Active Date MDM ?Diagnosis ?L30.4 Erythema intertrigo 09/24/2021 No Yes ?E11.622 Type 2 diabetes mellitus with othe

## 2021-10-02 NOTE — Patient Instructions (Addendum)
FIBER SUPPLEMENT ?You can do metamucil or fibercon once or twice a day but if this causes gas/bloating please switch to Benefiber or Citracel.  ?Fiber is good for constipation/diarrhea/irritable bowel syndrome.  ?Please do 1 TBSP in the morning in water, coffee, or tea. It can take up to a month before you can see a difference with your bowel movements.  ?It is cheapest from [employer], [employer], [employer].  ? ?Your provider has requested that you have an abdominal x ray before leaving today. Please go to the basement floor to our Radiology department for the test.  ? ?Your provider has requested that you go to the basement level for lab work before leaving today. Press "B" on the elevator. The lab is located at the first door on the left as you exit the elevator.  ? ?I appreciate the  opportunity to care for you ? ?Thank You  ? ?Byron Tipping,PA-C ? ? ?

## 2021-10-02 NOTE — Progress Notes (Signed)
Kerry [name], Kerry [name] ([id number]) ?Visit Report for 10/02/2021 ?Arrival Information Details ?Patient Name: Date of Service: ?Kerry [name], Kerry [name]. 10/02/2021 9:15 A M ?Medical Record Number: [id number] ?Patient Account Number: [id number] ?Date of Birth/Sex: Treating RN: ?05-Jul-1955 (66 y.o. F) Scotton, Mechele Claude ?Primary Care Reed Dady: Bradly Bienenstock Other Clinician: ?Referring Lisset Ketchem: ?Treating Macey Wurtz/Extender: Fredirick Maudlin ?Bradly Bienenstock ?Weeks in Treatment: 1 ?Visit Information History Since Last Visit ?Added or deleted any medications: No ?Patient Arrived: Wheel Chair ?Any new allergies or adverse reactions: No ?Arrival Time: 09:33 ?Had a fall or experienced change in No ?Accompanied By: family ?activities of daily living that may affect ?Transfer Assistance: [employer] stream[employer] ?risk of falls: ?Patient Identification Verified: Yes ?Signs or symptoms of abuse/neglect since last visito No ?Secondary Verification Process Completed: Yes ?Hospitalized since last visit: No ?Patient Requires Transmission-Based Precautions: No ?Implantable device outside of the clinic excluding No ?Patient Has Alerts: Yes ?cellular tissue based products placed in the center ?Patient Alerts: Patient on Blood Thinner since last visit: ?xarelto Pain Present Now: Unable to Respond ?Electronic Signature(s) ?Signed: 10/02/2021 4:07:42 PM By: Adline Peals ?Entered By: Adline Peals on 10/02/2021 09:34:41 ?-------------------------------------------------------------------------------- ?Encounter Discharge Information Details ?Patient Name: Date of Service: ?Kerry [name], Kerry [name]. 10/02/2021 9:15 A M ?Medical Record Number: [id number] ?Patient Account Number: [id number] ?Date of Birth/Sex: Treating RN: ?[birth year] (66 y.o. F) Scotton, Mechele Claude ?Primary Care Jazleen Robeck: Bradly Bienenstock Other Clinician: ?Referring Chessa Barrasso: ?Treating Heela Heishman/Extender: Fredirick Maudlin ?Bradly Bienenstock ?Weeks in Treatment: 1 ?Encounter Discharge Information  Items ?Discharge Condition: Stable ?Ambulatory Status: Wheelchair ?Discharge Destination: Home ?Transportation: Private Auto ?Accompanied By: son ?Schedule Follow-up Appointment: Yes ?Clinical Summary of Care: Patient Declined ?Electronic Signature(s) ?Signed: 10/02/2021 10:15:52 AM By: Dellie Catholic RN ?Entered By: Dellie Catholic on 10/02/2021 10:12:51 ?-------------------------------------------------------------------------------- ?Lower Extremity Assessment Details ?Patient Name: ?Date of Service: ?Kerry [name], Kerry [name]. 10/02/2021 9:15 A M ?Medical Record Number: [id number] ?Patient Account Number: [id number] ?Date of Birth/Sex: ?Treating RN: ?1955-08-05 (66 y.o. F) Scotton, Mechele Claude ?Primary Care Teyton Pattillo: Bradly Bienenstock ?Other Clinician: ?Referring Lua Feng: ?Treating Shevette Bess/Extender: Fredirick Maudlin ?Bradly Bienenstock ?Weeks in Treatment: 1 ?Electronic Signature(s) ?Signed: 10/02/2021 10:15:52 AM By: Dellie Catholic RN ?Signed: 10/02/2021 4:07:42 PM By: Adline Peals ?Entered By: Adline Peals on 10/02/2021 09:35:42 ?-------------------------------------------------------------------------------- ?Multi Wound Chart Details ?Patient Name: ?Date of Service: ?Kerry [name], Kerry [name]. 10/02/2021 9:15 A M ?Medical Record Number: [id number] ?Patient Account Number: [id number] ?Date of Birth/Sex: ?Treating RN: ?[birth year] (66 y.o. F) Scotton, Mechele Claude ?Primary Care Richard Holz: Bradly Bienenstock ?Other Clinician: ?Referring Teka Chanda: ?Treating Delanna Blacketer/Extender: Fredirick Maudlin ?Bradly Bienenstock ?Weeks in Treatment: 1 ?Vital Signs ?Height(in): 67 ?Pulse(bpm): 97 ?Weight(lbs): 215 ?Blood Pressure(mmHg): 134/81 ?Body Mass Index(BMI): 33.7 ?Temperature(??F): 97.5 ?Respiratory Rate(breaths/min): 18 ?Photos: [[name]/A:[name]/A] ?Left, Medial Gluteus [name]/A [name]/A ?Wound Location: ?Shear/Friction [name]/A [name]/A ?Wounding Event: ?Fungal [name]/A [name]/A ?Primary Etiology: ?Chronic sinus problems/congestion, [name]/A [name]/A ?Comorbid History: ?Chronic Obstructive  Pulmonary ?Disease (COPD), Coronary Artery ?Disease, Hypertension, Type II ?Diabetes, Seizure Disorder ?09/04/2021 [name]/A [name]/A ?Date Acquired: ?1 [name]/A [name]/A ?Weeks of Treatment: ?Open [name]/A [name]/A ?Wound Status: ?No [name]/A [name]/A ?Wound Recurrence: ?0.8x1.4x0.1 [name]/A [name]/A ?Measurements L x W x D (cm) ?0.88 [name]/A [name]/A ?A (cm?) : ?rea ?0.088 [name]/A [name]/A ?Volume (cm?) : ?40.70% [name]/A [name]/A ?% Reduction in Area: ?40.50% [name]/A [name]/A ?% Reduction in Volume: ?Full Thickness Without Exposed [name]/A [name]/A ?Classification: ?Support Structures ?Medium [name]/A [name]/A ?Exudate Amount: ?Serosanguineous [name]/A [name]/A ?Exudate Type: ?red, brown [name]/A [name]/A ?Exudate Color: ?Distinct, outline attached [name]/A [name]/A ?Wound Margin: ?Large (67-100%) [name]/A [name]/A ?Granulation Amount: ?Pink [name]/A [name]/A ?Granulation Quality: ?None Present (  0%) [name]/A [name]/A ?Necrotic Amount: ?Fat Layer (Subcutaneous Tissue): Yes [name]/A [name]/A ?Exposed Structures: ?Fascia: No ?Tendon: No ?Muscle: No ?Joint: No ?Bone: No ?Small (1-33%) [name]/A [name]/A ?Epithelialization: ?Treatment Notes ?Electronic Signature(s) ?Signed: 10/02/2021 9:56:42 AM By: Fredirick Maudlin MD FACS ?Signed: 10/02/2021 10:15:52 AM By: Dellie Catholic RN ?Entered By: Fredirick Maudlin on 10/02/2021 09:56:41 ?-------------------------------------------------------------------------------- ?Multi-Disciplinary Care Plan Details ?Patient Name: ?Date of Service: ?Kerry [name], Kerry [name]. 10/02/2021 9:15 A M ?Medical Record Number: [id number] ?Patient Account Number: [id number] ?Date of Birth/Sex: ?Treating RN: ?1956/01/08 (66 y.o. F) Scotton, Mechele Claude ?Primary Care Lajuana Patchell: Bradly Bienenstock ?Other Clinician: ?Referring Cleatus Gabriel: ?Treating Cullin Dishman/Extender: Fredirick Maudlin ?Bradly Bienenstock ?Weeks in Treatment: 1 ?Active Inactive ?Wound/Skin Impairment ?Nursing Diagnoses: ?Impaired tissue integrity ?Goals: ?Patient/caregiver will verbalize understanding of skin care regimen ?Date Initiated: 09/24/2021 ?Target Resolution Date: 10/24/2021 ?Goal Status: Active ?Interventions: ?Assess ulceration(s) every  visit ?Notes: ?Electronic Signature(s) ?Signed: 10/02/2021 10:15:52 AM By: Dellie Catholic RN ?Entered By: Dellie Catholic on 10/02/2021 09:57:24 ?-------------------------------------------------------------------------------- ?Pain Assessment Details ?Patient Name: ?Date of Service: ?Kerry [name], Kerry [name]. 10/02/2021 9:15 A M ?Medical Record Number: [id number] ?Patient Account Number: [id number] ?Date of Birth/Sex: ?Treating RN: ?03-29-1956 (66 y.o. F) Scotton, Mechele Claude ?Primary Care Beckem Tomberlin: Bradly Bienenstock ?Other Clinician: ?Referring Erman Thum: ?Treating Tyrie Porzio/Extender: Fredirick Maudlin ?Bradly Bienenstock ?Weeks in Treatment: 1 ?Active Problems ?Location of Pain Severity and Description of Pain ?Patient Has Paino Patient Unable to Respond ?Site Locations ?Pain Management and Medication ?Current Pain Management: ?Electronic Signature(s) ?Signed: 10/02/2021 10:15:52 AM By: Dellie Catholic RN ?Signed: 10/02/2021 4:07:42 PM By: Adline Peals ?Entered By: Adline Peals on 10/02/2021 09:35:34 ?-------------------------------------------------------------------------------- ?Patient/Caregiver Education Details ?Patient Name: ?Date of Service: ?Kerry [name], Kerry S. 4/12/2023andnbsp9:15 A M ?Medical Record Number: [id number] ?Patient Account Number: [id number] ?Date of Birth/Gender: ?Treating RN: ?1956-01-13 (66 y.o. F) Scotton, Mechele Claude ?Primary Care Physician: Bradly Bienenstock ?Other Clinician: ?Referring Physician: ?Treating Physician/Extender: Fredirick Maudlin ?Bradly Bienenstock ?Weeks in Treatment: 1 ?Education Assessment ?Education Provided To: ?Patient ?Education Topics Provided ?Electronic Signature(s) ?Signed: 10/02/2021 10:15:52 AM By: Dellie Catholic RN ?Entered By: Dellie Catholic on 10/02/2021 09:57:37 ?-------------------------------------------------------------------------------- ?Wound Assessment Details ?Patient Name: ?Date of Service: ?Kerry [name], Kerry [name]. 10/02/2021 9:15 A M ?Medical Record Number:  [id number] ?Patient Account Number: [id number] ?Date of Birth/Sex: ?Treating RN: ?December 26, 1955 (66 y.o. F) Scotton, Mechele Claude ?Primary Care Narely Nobles: ?Other Clinician: ?Bradly Bienenstock ?Referring Marcos Peloso: ?Treating Muhammed Teutsch/Extender: Magda Bernheim

## 2021-10-03 ENCOUNTER — Other Ambulatory Visit: Payer: Self-pay

## 2021-10-03 DIAGNOSIS — K529 Noninfective gastroenteritis and colitis, unspecified: Secondary | ICD-10-CM

## 2021-10-03 NOTE — Progress Notes (Signed)
____________________________________________________________ ? ?Attending physician addendum: ? ?Thank you for sending this case to me. ?I have reviewed the entire note and agree with the plan. ? ?This patient is followed by the [place] [facility], and was referred to [place] solely for a hospital-based outpatient colonoscopy to evaluate her diarrhea.  As the patient declines that (which is understandable, given her medical complexity and increased procedural risks), she will be returned to the care of the [place] practice for further evaluation and management. ?I agree fecal elastase is worth checking.  If low, must also consider that it could be dilutional effect if there is another cause of watery diarrhea such as microscopic colitis.  Her primary GI team should consider empiric therapy for SIBO if not previously done. ? ?Wilfrid Lund, MD ? ?____________________________________________________________ ? ?

## 2021-10-10 ENCOUNTER — Emergency Department (HOSPITAL_COMMUNITY): Payer: 59

## 2021-10-10 ENCOUNTER — Encounter (HOSPITAL_COMMUNITY): Payer: Self-pay

## 2021-10-10 ENCOUNTER — Other Ambulatory Visit: Payer: Self-pay

## 2021-10-10 ENCOUNTER — Emergency Department (HOSPITAL_COMMUNITY)
Admission: EM | Admit: 2021-10-10 | Discharge: 2021-10-11 | Disposition: A | Discharge status: Home / Self Care | Payer: 59 | Attending: Emergency Medicine | Admitting: Emergency Medicine

## 2021-10-10 DIAGNOSIS — E119 Type 2 diabetes mellitus without complications: Secondary | ICD-10-CM | POA: Insufficient documentation

## 2021-10-10 DIAGNOSIS — R112 Nausea with vomiting, unspecified: Secondary | ICD-10-CM | POA: Insufficient documentation

## 2021-10-10 DIAGNOSIS — I10 Essential (primary) hypertension: Secondary | ICD-10-CM | POA: Insufficient documentation

## 2021-10-10 DIAGNOSIS — Z794 Long term (current) use of insulin: Secondary | ICD-10-CM | POA: Diagnosis not present

## 2021-10-10 DIAGNOSIS — Z79899 Other long term (current) drug therapy: Secondary | ICD-10-CM | POA: Insufficient documentation

## 2021-10-10 DIAGNOSIS — Z853 Personal history of malignant neoplasm of breast: Secondary | ICD-10-CM | POA: Insufficient documentation

## 2021-10-10 DIAGNOSIS — K5641 Fecal impaction: Secondary | ICD-10-CM | POA: Diagnosis not present

## 2021-10-10 DIAGNOSIS — I2510 Atherosclerotic heart disease of native coronary artery without angina pectoris: Secondary | ICD-10-CM | POA: Insufficient documentation

## 2021-10-10 LAB — COMPREHENSIVE METABOLIC PANEL
ALT: 16 U/L (ref 0–44)
AST: 14 U/L — ABNORMAL LOW (ref 15–41)
Albumin: 3.8 g/dL (ref 3.5–5.0)
Alkaline Phosphatase: 66 U/L (ref 38–126)
Anion gap: 11 (ref 5–15)
BUN: 20 mg/dL (ref 8–23)
CO2: 21 mmol/L — ABNORMAL LOW (ref 22–32)
Calcium: 9 mg/dL (ref 8.9–10.3)
Chloride: 102 mmol/L (ref 98–111)
Creatinine, Ser: 0.75 mg/dL (ref 0.44–1.00)
GFR, Estimated: >60 mL/min (ref >60)
Glucose, Bld: 398 mg/dL — ABNORMAL HIGH (ref 70–99)
Potassium: 3.5 mmol/L (ref 3.5–5.1)
Sodium: 134 mmol/L — ABNORMAL LOW (ref 135–145)
Total Bilirubin: 0.7 mg/dL (ref 0.3–1.2)
Total Protein: 8.2 g/dL — ABNORMAL HIGH (ref 6.5–8.1)

## 2021-10-10 LAB — CBC
HCT: 43.4 % (ref 36.0–46.0)
Hemoglobin: 13.9 g/dL (ref 12.0–15.0)
MCH: 27.5 pg (ref 26.0–34.0)
MCHC: 32 g/dL (ref 30.0–36.0)
MCV: 85.8 fL (ref 80.0–100.0)
Platelets: 292 10*3/uL (ref 150–400)
RBC: 5.06 MIL/uL (ref 3.87–5.11)
RDW: 14.3 % (ref 11.5–15.5)
WBC: 25.5 10*3/uL — ABNORMAL HIGH (ref 4.0–10.5)
nRBC: 0 % (ref 0.0–0.2)

## 2021-10-10 LAB — LIPASE, BLOOD: Lipase: 23 U/L (ref 11–51)

## 2021-10-10 MED ORDER — ONDANSETRON HCL 4 MG/2ML IJ SOLN
4.0000 mg | Freq: Once | INTRAMUSCULAR | Status: AC
  Administered 2021-10-10: 4 mg via INTRAVENOUS
  Filled 2021-10-10: qty 2

## 2021-10-10 MED ORDER — SODIUM CHLORIDE 0.9 % IV SOLN: 1000.0000 mL | INTRAVENOUS | Status: DC

## 2021-10-10 MED ORDER — SODIUM CHLORIDE 0.9 % IV BOLUS (SEPSIS)
1000.0000 mL | Freq: Once | INTRAVENOUS | Status: AC
  Administered 2021-10-10: 1000 mL via INTRAVENOUS

## 2021-10-10 MED ORDER — IOHEXOL 300 MG/ML  SOLN
100.0000 mL | Freq: Once | INTRAMUSCULAR | Status: AC | PRN
Start: 2021-10-10 — End: 2021-10-10
  Administered 2021-10-10: 100 mL via INTRAVENOUS

## 2021-10-10 NOTE — ED Triage Notes (Signed)
Pt to ED from home via [facility].  Pt has had abdominal pain and has been vomiting today.  Pt received zofran '4mg'$  IV by EMS. Pt alert to person on arrival, when asked other questions, she states, "I don't know".  ?

## 2021-10-10 NOTE — ED Notes (Signed)
Patient transported to CT/xray 

## 2021-10-10 NOTE — ED Provider Notes (Signed)
?[facility] ?Provider Note ? ? ?CSN: [id number] ?Arrival date & time: 10/10/21  2124 ? ?  ? ?History ? ?Chief Complaint  ?Patient presents with  ? Emesis  ? ? ?Kerry [name] is a 66 y.o. female. ? ? 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  Patient has a history of prior stroke.  She has a tracheostomy and at baseline is only able to answer with a few words.  Patient apparently was initially doing well today when she suddenly started having nausea and vomiting.  Son is with her providing additional information.  Unclear if she has been having any abdominal pain today.  There has not been any diarrhea.  Unclear if she has any urinary discomfort.  Patient does continue to feel nauseated. ? ?Home Medications ?Prior to Admission medications   ?Medication Sig Start Date End Date Taking? Authorizing Provider  ?amLODipine (NORVASC) 10 MG tablet Take 1 tablet (10 mg total) by mouth daily. 07/09/15   [name], DO  ?baclofen (LIORESAL) 10 MG tablet Take 10 mg by mouth 2 (two) times daily as needed for muscle spasms. 10/01/19   [provider]  ?carvedilol (COREG) 12.5 MG tablet Take 1 tablet (12.5 mg total) by mouth 2 (two) times daily with a meal. 07/09/15   [name], DO  ?Cholecalciferol 125 MCG (5000 UT) capsule Take 5,000 Units by mouth daily.    [provider]  ?citalopram (CELEXA) 40 MG tablet Take 40 mg by mouth daily.  07/28/15   [provider]  ?Insulin Pen Needle 32G X 4 MM MISC Use with insulin pen to dispense insulin daily 07/09/15   [name], DO  ?LEVEMIR FLEXPEN 100 UNIT/ML FlexPen Inject into the skin. 09/06/21   [provider]  ?lisinopril (PRINIVIL,ZESTRIL) 10 MG tablet Take 1 tablet  (10 mg total) by mouth daily. 07/09/15   [name], DO  ?pravastatin (PRAVACHOL) 40 MG tablet Take 40 mg by mouth daily. 04/28/20   [provider]  ?rivaroxaban (XARELTO) 20 MG TABS tablet Take 1 tablet (20 mg total) by mouth daily with supper. 07/09/15   [name], DO  ?   ? ?Allergies    ?Codeine, Oxycodone, and Oxycontin [oxycodone hcl]   ? ?Review of Systems   ?Review of Systems  ?Constitutional:  Negative for fever.  ?Gastrointestinal:  Positive for vomiting.  ? ?Physical Exam ?Updated Vital Signs ?BP (!) 102/58   Pulse 70   Temp 98.2 ?F (36.8 ?C) (Oral)   Resp 19   Ht 1.626 m ('5\' 4"'$ )   Wt 90.7 kg   SpO2 91%   BMI 34.33 kg/m?  ?Physical Exam ?Vitals and nursing note reviewed.  ?Constitutional:   ?   Appearance: She is well-developed. She is not diaphoretic.  ?HENT:  ?   Head: Normocephalic and atraumatic.  ?   Right Ear: External ear normal.  ?   Left Ear: External ear normal.  ?Eyes:  ?   General: No scleral icterus.    ?   Right eye: No discharge.     ?   Left eye: No discharge.  ?   Conjunctiva/sclera: Conjunctivae normal.  ?Neck:  ?   Trachea: No tracheal deviation.  ?Cardiovascular:  ?   Rate and Rhythm: Normal rate and regular rhythm.  ?Pulmonary:  ?  Effort: Pulmonary effort is normal. No respiratory distress.  ?   Breath sounds: Normal breath sounds. No stridor. No wheezing or rales.  ?Abdominal:  ?   General: Bowel sounds are normal. There is no distension.  ?   Palpations: Abdomen is soft.  ?   Tenderness: There is abdominal tenderness. There is no guarding or rebound.  ?Musculoskeletal:     ?   General: No tenderness or deformity.  ?   Cervical back: Neck supple.  ?Skin: ?   General: Skin is warm and dry.  ?   Findings: No rash.  ?Neurological:  ?   Mental Status: She is alert.  ?   Cranial Nerves: Cranial nerve deficit (Aphasic) present.  ?   Sensory: No sensory deficit.  ?   Motor: Weakness present. No abnormal muscle tone or seizure activity.  ?   Coordination:  Coordination normal.  ?   Comments: residual deficits from stroke  ?Psychiatric:     ?   Mood and Affect: Mood normal.  ? ? ?ED Results / Procedures / Treatments   ?Labs ?(all labs ordered are listed, but only abnormal results are displayed) ?Labs Reviewed  ?COMPREHENSIVE METABOLIC PANEL - Abnormal; Notable for the following components:  ?    Result Value  ? Sodium 134 (*)   ? CO2 21 (*)   ? Glucose, Bld 398 (*)   ? Total Protein 8.2 (*)   ? AST 14 (*)   ? All other components within normal limits  ?CBC - Abnormal; Notable for the following components:  ? WBC 25.5 (*)   ? All other components within normal limits  ?LIPASE, BLOOD  ?URINALYSIS, ROUTINE W REFLEX MICROSCOPIC  ? ? ?EKG ?EKG Interpretation ? ?Date/Time:  Thursday October 10 2021 21:39:45 EDT ?Ventricular Rate:  62 ?PR Interval:  184 ?QRS Duration: 88 ?QT Interval:  459 ?QTC Calculation: 467 ?R Axis:   3 ?Text Interpretation: Sinus rhythm st and t wave changes resolved since last tracing Confirmed by Dorie Rank [phone]) on 10/10/2021 9:42:00 PM ? ?Radiology ?DG Chest Portable 1 View ? ?Result Date: 10/10/2021 ?CLINICAL DATA:  Weakness and vomiting. EXAM: PORTABLE CHEST 1 VIEW COMPARISON:  Chest x-ray 02/02/2020 FINDINGS: The tip of the tracheostomy is at the level of the clavicular heads, unchanged. The heart is mildly enlarged, unchanged. The lungs and costophrenic angles are clear. No pneumothorax or acute fracture. Left axillary surgical clips are again seen. IMPRESSION: No active disease. Electronically Signed   By: Ronney Asters M.D.   On: 10/10/2021 23:31   ? ?Procedures ?Procedures  ? ? ?Medications Ordered in ED ?Medications  ?sodium chloride 0.9 % bolus 1,000 mL (1,000 mLs Intravenous New Bag/Given 10/10/21 2239)  ?  Followed by  ?0.9 %  sodium chloride infusion (has no administration in time range)  ?ondansetron [facility]) injection 4 mg (4 mg Intravenous Given 10/10/21 2239)  ?iohexol (OMNIPAQUE) 300 MG/ML solution 100 mL (100 mLs Intravenous Contrast  Given 10/10/21 2322)  ? ? ?ED Course/ Medical Decision Making/ A&P ?Clinical Course as of 10/10/21 2346  ?Thu Oct 10, 2021  ?2309 CBC(!) ?White blood cell count elevated [JK]  ?2309 Comprehensive metabolic panel(!) ?Hyperglycemia noted [JK]  ?2345 DG Chest Portable 1 View ?Chest x-ray images and radiology report reviewed.  No acute abnormalities [JK]  ?  ?Clinical Course User Index ?[JK] Dorie Rank, MD  ? ?                        ?  Medical Decision Making ?Amount and/or Complexity of Data Reviewed ?Labs: ordered. Decision-making details documented in ED Course. ?Radiology: ordered. ? ?Risk ?Prescription drug management. ? ? ?Patient with multiple comorbidities.  History and exam limited by her aphasia.  Patient presents ED with complaints of acute emesis.  She does have significant leukocytosis.  Patient at risk for colitis diverticulitis, acute obstruction.  CT scan has been ordered.  Patient has been treated with IV fluids and antiemetics.  Urinalysis is pending.  Care turned over to Dr. [name] at shift change ? ? ? ? ? ? ? ?Final Clinical Impression(s) / ED Diagnoses ?pending ? ? ?  ?Dorie Rank, MD ?10/10/21 2346 ? ?

## 2021-10-10 NOTE — ED Notes (Signed)
ED Provider at bedside. 

## 2021-10-11 LAB — URINALYSIS, ROUTINE W REFLEX MICROSCOPIC
Bilirubin Urine: NEGATIVE
Glucose, UA: >=500 mg/dL — AB
Ketones, ur: NEGATIVE mg/dL
Nitrite: NEGATIVE
Protein, ur: 30 mg/dL — AB
Specific Gravity, Urine: 1.045 — ABNORMAL HIGH (ref 1.005–1.030)
pH: 5 (ref 5.0–8.0)

## 2021-10-11 NOTE — ED Notes (Signed)
[facility] called for transport

## 2021-10-11 NOTE — ED Provider Notes (Signed)
?  Provider Note ?MRN:  [id number]  ?Arrival date & time: 10/11/21    ?ED Course and Medical Decision Making  ?Assumed care from Dr. Tomi Bamberger at shift change. ? ?History of stroke with aphasia, hemiplegia here with vomiting, abdominal tenderness awaiting CT imaging. ? ?Fecal disimpaction ? ?Date/Time: 10/11/2021 3:25 AM ?Performed by: Maudie Flakes, MD ?Authorized by: Maudie Flakes, MD  ?Consent: Verbal consent obtained. ?Risks and benefits: risks, benefits and alternatives were discussed ?Consent given by: patient ?Patient understanding: patient states understanding of the procedure being performed ?Imaging studies: imaging studies available ?Patient identity confirmed: verbally with patient ?Time out: Immediately prior to procedure a "time out" was called to verify the correct patient, procedure, equipment, support staff and site/side marked as required. ?Patient tolerance: patient tolerated the procedure well with no immediate complications ?Comments: Somewhat firm stool high within the rectal vault, breaks apart with attempt to remove digitally. ? ? ? ?CT imaging is without acute/emergent process.  There is evidence of fecal impaction and constipation.  Fecal disimpaction attempted, really too high to reach and not significantly firm.  Good success with soapsuds enema.  Patient feels well, otherwise reassuring work-up, leukocytosis of unclear significance.  No fever here in the emergency department, no signs of sepsis.  Appropriate for discharge with return precautions. ? ?Final Clinical Impressions(s) / ED Diagnoses  ? ?  ICD-10-CM   ?1. Nausea and vomiting, unspecified vomiting type  R11.2   ?  ?2. Fecal impaction ([place])  K56.41   ?  ?  ?ED Discharge Orders   ? ? None  ? ?  ?  ? ? ?Discharge Instructions   ? ?  ?You were evaluated in the Emergency Department and after careful evaluation, we did not find any emergent condition requiring admission or further testing in the hospital. ? ?Your exam/testing today is  overall reassuring.  Your symptoms may have been related to constipation and/or a fecal impaction.  We were able to disimpact you with an enema.  Please use over-the-counter MiraLAX as we discussed. ? ?Your CT scan did not show any emergencies.  There is a cyst on one of your ovaries and you should tell your primary care doctor about this.  You may need an ultrasound of your ovaries to look at it more closely. ? ?Please return to the Emergency Department if you experience any worsening of your condition.   Thank you for allowing [place] to be a part of your care. ? ? ? ? ? ?Barth Kirks. [name], MD ?[facility] Emergency Medicine ?[email or web address] ? ?  ?Maudie Flakes, MD ?10/11/21 0327 ? ?

## 2021-10-11 NOTE — Discharge Instructions (Signed)
You were evaluated in the Emergency Department and after careful evaluation, we did not find any emergent condition requiring admission or further testing in the hospital. ? ?Your exam/testing today is overall reassuring.  Your symptoms may have been related to constipation and/or a fecal impaction.  We were able to disimpact you with an enema.  Please use over-the-counter MiraLAX as we discussed. ? ?Your CT scan did not show any emergencies.  There is a cyst on one of your ovaries and you should tell your primary care doctor about this.  You may need an ultrasound of your ovaries to look at it more closely. ? ?Please return to the Emergency Department if you experience any worsening of your condition.   Thank you for allowing [place] to be a part of your care. ?

## 2021-10-15 ENCOUNTER — Encounter (HOSPITAL_BASED_OUTPATIENT_CLINIC_OR_DEPARTMENT_OTHER): Payer: 59 | Admitting: General Surgery

## 2021-10-16 ENCOUNTER — Ambulatory Visit (HOSPITAL_COMMUNITY): Payer: 59
